# Patient Record
Sex: Male | Born: 1940
Health system: Southern US, Community
[De-identification: ages and names within clinical notes are randomized; demographics above are authoritative.]

## PROBLEM LIST (undated history)

## (undated) DIAGNOSIS — J45909 Unspecified asthma, uncomplicated: Secondary | ICD-10-CM

## (undated) DIAGNOSIS — I10 Essential (primary) hypertension: Secondary | ICD-10-CM

## (undated) DIAGNOSIS — Z8673 Personal history of transient ischemic attack (TIA), and cerebral infarction without residual deficits: Secondary | ICD-10-CM

## (undated) DIAGNOSIS — I219 Acute myocardial infarction, unspecified: Secondary | ICD-10-CM

## (undated) DIAGNOSIS — E785 Hyperlipidemia, unspecified: Secondary | ICD-10-CM

## (undated) DIAGNOSIS — I2510 Atherosclerotic heart disease of native coronary artery without angina pectoris: Secondary | ICD-10-CM

## (undated) DIAGNOSIS — F32A Depression, unspecified: Secondary | ICD-10-CM

## (undated) DIAGNOSIS — I739 Peripheral vascular disease, unspecified: Secondary | ICD-10-CM

## (undated) DIAGNOSIS — F329 Major depressive disorder, single episode, unspecified: Secondary | ICD-10-CM

## (undated) DIAGNOSIS — F419 Anxiety disorder, unspecified: Secondary | ICD-10-CM

## (undated) DIAGNOSIS — I779 Disorder of arteries and arterioles, unspecified: Secondary | ICD-10-CM

## (undated) DIAGNOSIS — J449 Chronic obstructive pulmonary disease, unspecified: Secondary | ICD-10-CM

## (undated) DIAGNOSIS — N529 Male erectile dysfunction, unspecified: Secondary | ICD-10-CM

## (undated) DIAGNOSIS — K922 Gastrointestinal hemorrhage, unspecified: Secondary | ICD-10-CM

## (undated) DIAGNOSIS — Z9289 Personal history of other medical treatment: Secondary | ICD-10-CM

## (undated) HISTORY — DX: Atherosclerotic heart disease of native coronary artery without angina pectoris: I25.10

## (undated) HISTORY — PX: TONSILLECTOMY: SUR1361

## (undated) HISTORY — DX: Hyperlipidemia, unspecified: E78.5

## (undated) HISTORY — PX: UMBILICAL HERNIA REPAIR: SHX196

## (undated) HISTORY — DX: Disorder of arteries and arterioles, unspecified: I77.9

## (undated) HISTORY — PX: FRACTURE SURGERY: SHX138

## (undated) HISTORY — PX: CATARACT EXTRACTION W/ INTRAOCULAR LENS  IMPLANT, BILATERAL: SHX1307

## (undated) HISTORY — DX: Personal history of transient ischemic attack (TIA), and cerebral infarction without residual deficits: Z86.73

## (undated) HISTORY — PX: HERNIA REPAIR: SHX51

## (undated) HISTORY — DX: Male erectile dysfunction, unspecified: N52.9

## (undated) HISTORY — DX: Essential (primary) hypertension: I10

## (undated) HISTORY — DX: Chronic obstructive pulmonary disease, unspecified: J44.9

## (undated) HISTORY — DX: Peripheral vascular disease, unspecified: I73.9

## (undated) HISTORY — PX: CORONARY ANGIOPLASTY: SHX604

## (undated) HISTORY — PX: CARDIAC CATHETERIZATION: SHX172

---

## 1993-03-02 HISTORY — PX: TIBIA FRACTURE SURGERY: SHX806

## 1997-03-02 HISTORY — PX: CORONARY ARTERY BYPASS GRAFT: SHX141

## 1997-08-08 ENCOUNTER — Other Ambulatory Visit: Admission: RE | Admit: 1997-08-08 | Discharge: 1997-08-08 | Payer: Self-pay | Admitting: Cardiology

## 1997-08-31 ENCOUNTER — Inpatient Hospital Stay (HOSPITAL_COMMUNITY): Admission: RE | Admit: 1997-08-31 | Discharge: 1997-09-10 | Payer: Self-pay | Admitting: Cardiovascular Disease

## 2006-03-02 DIAGNOSIS — Z9289 Personal history of other medical treatment: Secondary | ICD-10-CM

## 2006-03-02 DIAGNOSIS — K922 Gastrointestinal hemorrhage, unspecified: Secondary | ICD-10-CM

## 2006-03-02 HISTORY — DX: Gastrointestinal hemorrhage, unspecified: K92.2

## 2006-03-02 HISTORY — DX: Personal history of other medical treatment: Z92.89

## 2007-04-13 ENCOUNTER — Ambulatory Visit: Payer: Self-pay | Admitting: Vascular Surgery

## 2009-11-08 ENCOUNTER — Ambulatory Visit: Payer: Self-pay | Admitting: Cardiology

## 2010-04-01 ENCOUNTER — Ambulatory Visit: Payer: Self-pay | Admitting: Cardiology

## 2010-05-26 ENCOUNTER — Other Ambulatory Visit: Payer: Self-pay | Admitting: *Deleted

## 2010-05-26 DIAGNOSIS — E7800 Pure hypercholesterolemia, unspecified: Secondary | ICD-10-CM

## 2010-05-26 MED ORDER — EZETIMIBE 10 MG PO TABS: 10.0000 mg | ORAL_TABLET | Freq: Every day | ORAL | Status: DC

## 2010-05-26 NOTE — Telephone Encounter (Signed)
Refilled meds per fax request.  

## 2010-06-17 ENCOUNTER — Other Ambulatory Visit: Payer: Self-pay | Admitting: *Deleted

## 2010-06-17 DIAGNOSIS — J449 Chronic obstructive pulmonary disease, unspecified: Secondary | ICD-10-CM

## 2010-06-17 DIAGNOSIS — I10 Essential (primary) hypertension: Secondary | ICD-10-CM

## 2010-06-17 MED ORDER — HYDROCHLOROTHIAZIDE 25 MG PO TABS: 25.0000 mg | ORAL_TABLET | Freq: Every day | ORAL | Status: DC

## 2010-06-17 MED ORDER — THEOPHYLLINE 300 MG PO TB12: 300.0000 mg | ORAL_TABLET | Freq: Two times a day (BID) | ORAL | Status: DC

## 2010-06-17 NOTE — Telephone Encounter (Signed)
Refilled meds per fax request. Faxed back to pharmacy  

## 2010-06-20 ENCOUNTER — Encounter: Payer: Self-pay | Admitting: Cardiology

## 2010-06-23 ENCOUNTER — Other Ambulatory Visit: Payer: Self-pay | Admitting: *Deleted

## 2010-06-23 DIAGNOSIS — Z79899 Other long term (current) drug therapy: Secondary | ICD-10-CM

## 2010-06-23 DIAGNOSIS — E7800 Pure hypercholesterolemia, unspecified: Secondary | ICD-10-CM

## 2010-06-27 ENCOUNTER — Encounter: Payer: Self-pay | Admitting: *Deleted

## 2010-06-27 DIAGNOSIS — J449 Chronic obstructive pulmonary disease, unspecified: Secondary | ICD-10-CM

## 2010-06-27 DIAGNOSIS — I2489 Other forms of acute ischemic heart disease: Secondary | ICD-10-CM | POA: Insufficient documentation

## 2010-06-27 DIAGNOSIS — R0989 Other specified symptoms and signs involving the circulatory and respiratory systems: Secondary | ICD-10-CM

## 2010-06-27 DIAGNOSIS — I25110 Atherosclerotic heart disease of native coronary artery with unstable angina pectoris: Secondary | ICD-10-CM | POA: Insufficient documentation

## 2010-06-27 DIAGNOSIS — Z9289 Personal history of other medical treatment: Secondary | ICD-10-CM

## 2010-06-27 DIAGNOSIS — E785 Hyperlipidemia, unspecified: Secondary | ICD-10-CM

## 2010-06-27 DIAGNOSIS — I259 Chronic ischemic heart disease, unspecified: Secondary | ICD-10-CM

## 2010-06-30 ENCOUNTER — Other Ambulatory Visit: Payer: Self-pay | Admitting: *Deleted

## 2010-06-30 MED ORDER — IPRATROPIUM-ALBUTEROL 18-103 MCG/ACT IN AERO: 2.0000 | INHALATION_SPRAY | Freq: Four times a day (QID) | RESPIRATORY_TRACT | Status: DC | PRN

## 2010-06-30 NOTE — Telephone Encounter (Signed)
Faxed refill

## 2010-07-03 ENCOUNTER — Ambulatory Visit (INDEPENDENT_AMBULATORY_CARE_PROVIDER_SITE_OTHER): Payer: Medicare Other | Admitting: Cardiology

## 2010-07-03 ENCOUNTER — Other Ambulatory Visit (INDEPENDENT_AMBULATORY_CARE_PROVIDER_SITE_OTHER): Payer: Medicare Other | Admitting: *Deleted

## 2010-07-03 ENCOUNTER — Encounter: Payer: Self-pay | Admitting: Cardiology

## 2010-07-03 DIAGNOSIS — E7800 Pure hypercholesterolemia, unspecified: Secondary | ICD-10-CM

## 2010-07-03 DIAGNOSIS — Z79899 Other long term (current) drug therapy: Secondary | ICD-10-CM

## 2010-07-03 DIAGNOSIS — R0989 Other specified symptoms and signs involving the circulatory and respiratory systems: Secondary | ICD-10-CM

## 2010-07-03 DIAGNOSIS — E785 Hyperlipidemia, unspecified: Secondary | ICD-10-CM

## 2010-07-03 DIAGNOSIS — I2510 Atherosclerotic heart disease of native coronary artery without angina pectoris: Secondary | ICD-10-CM

## 2010-07-03 LAB — CBC WITH DIFFERENTIAL/PLATELET
Basophils Absolute: 0 10*3/uL (ref 0.0–0.1)
Basophils Relative: 0.4 % (ref 0.0–3.0)
Eosinophils Absolute: 0.1 10*3/uL (ref 0.0–0.7)
Eosinophils Relative: 1.7 % (ref 0.0–5.0)
HCT: 41.4 % (ref 39.0–52.0)
Hemoglobin: 14.2 g/dL (ref 13.0–17.0)
Lymphocytes Relative: 26.8 % (ref 12.0–46.0)
Lymphs Abs: 2 10*3/uL (ref 0.7–4.0)
MCHC: 34.2 g/dL (ref 30.0–36.0)
MCV: 94.9 fl (ref 78.0–100.0)
Monocytes Absolute: 0.6 10*3/uL (ref 0.1–1.0)
Monocytes Relative: 8.6 % (ref 3.0–12.0)
Neutro Abs: 4.6 10*3/uL (ref 1.4–7.7)
Neutrophils Relative %: 62.5 % (ref 43.0–77.0)
Platelets: 253 10*3/uL (ref 150.0–400.0)
RBC: 4.36 Mil/uL (ref 4.22–5.81)
RDW: 13.2 % (ref 11.5–14.6)
WBC: 7.3 10*3/uL (ref 4.5–10.5)

## 2010-07-03 LAB — LIPID PANEL
Cholesterol: 194 mg/dL (ref 0–200)
HDL: 31.2 mg/dL — ABNORMAL LOW (ref >39.00)
LDL Cholesterol: 132 mg/dL — ABNORMAL HIGH (ref 0–99)
Total CHOL/HDL Ratio: 6
Triglycerides: 152 mg/dL — ABNORMAL HIGH (ref 0.0–149.0)
VLDL: 30.4 mg/dL (ref 0.0–40.0)

## 2010-07-03 LAB — BASIC METABOLIC PANEL
BUN: 14 mg/dL (ref 6–23)
CO2: 29 mEq/L (ref 19–32)
Calcium: 8.9 mg/dL (ref 8.4–10.5)
Chloride: 99 mEq/L (ref 96–112)
Creatinine, Ser: 1.1 mg/dL (ref 0.4–1.5)
GFR: 73.76 mL/min (ref >60.00)
Glucose, Bld: 89 mg/dL (ref 70–99)
Potassium: 3.4 mEq/L — ABNORMAL LOW (ref 3.5–5.1)
Sodium: 137 mEq/L (ref 135–145)

## 2010-07-03 LAB — HEPATIC FUNCTION PANEL
ALT: 36 U/L (ref 0–53)
AST: 32 U/L (ref 0–37)
Albumin: 3.7 g/dL (ref 3.5–5.2)
Alkaline Phosphatase: 75 U/L (ref 39–117)
Bilirubin, Direct: 0.1 mg/dL (ref 0.0–0.3)
Total Bilirubin: 0.7 mg/dL (ref 0.3–1.2)
Total Protein: 7 g/dL (ref 6.0–8.3)

## 2010-07-03 NOTE — Assessment & Plan Note (Signed)
The patient has a known left carotid bruit.  He has had carotid Doppler in 2009 which suggested less than a 50% stenosis.  He has not been expressing any TIA symptoms.
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
## 2010-07-03 NOTE — Assessment & Plan Note (Signed)
The patient has not been experiencing any recurrent chest pain or angina. 

## 2010-07-03 NOTE — Assessment & Plan Note (Signed)
The patient has not been getting any regular exercise.  His weight remains too high.  He is not having any side effects from his cholesterol medication.  He is intolerant of statins because of myalgias.

## 2010-07-04 ENCOUNTER — Other Ambulatory Visit: Payer: Self-pay | Admitting: *Deleted

## 2010-07-04 ENCOUNTER — Telehealth: Payer: Self-pay | Admitting: *Deleted

## 2010-07-04 ENCOUNTER — Ambulatory Visit: Payer: Self-pay | Admitting: Cardiology

## 2010-07-04 NOTE — Telephone Encounter (Signed)
Adv pt of lab

## 2010-07-15 NOTE — Procedures (Signed)
CAROTID DUPLEX EXAM   INDICATION:  Left carotid bruit.   HISTORY:  Diabetes:  No.  Cardiac:  CABG in 1999.  Hypertension:  Yes.  Smoking:  Cigar.  Previous Surgery:  CV History:  Amaurosis Fugax No, Paresthesias No, Hemiparesis No                                       RIGHT             LEFT  Brachial systolic pressure:         110               100  Brachial Doppler waveforms:         Biphasic          Biphasic  Vertebral direction of flow:        Antegrade         Antegrade  DUPLEX VELOCITIES (cm/sec)  CCA peak systolic                   88                235 (distal)  ECA peak systolic                   118               178  ICA peak systolic                   64                61  ICA end diastolic                   18                18  PLAQUE MORPHOLOGY:                  Calcified         Calcified  PLAQUE AMOUNT:                      Mild              Moderate  PLAQUE LOCATION:                    ICA, ECA          CCA, ECA, ICA   IMPRESSION:  1. 20-39% stenosis noted in bilateral internal carotid arteries.  2. Approximately 60-79% stenosis noted in the left bifurcation and      distal common carotid artery.  3. Antegrade bilateral vertebral arteries.   ___________________________________________  Larina Earthly, M.D.   MG/MEDQ  D:  04/13/2007  T:  04/14/2007  Job:  [id number]

## 2010-09-08 ENCOUNTER — Other Ambulatory Visit: Payer: Self-pay | Admitting: Cardiology

## 2010-09-09 ENCOUNTER — Other Ambulatory Visit: Payer: Self-pay | Admitting: *Deleted

## 2010-09-09 MED ORDER — OMEGA-3-ACID ETHYL ESTERS 1 G PO CAPS: 2.0000 g | ORAL_CAPSULE | Freq: Two times a day (BID) | ORAL | Status: DC

## 2010-09-09 NOTE — Telephone Encounter (Signed)
Fax received from pharmacy. Refill completed. Jodette Jarod Bozzo RN  

## 2010-09-09 NOTE — Telephone Encounter (Signed)
Fax received from pharmacy. Refill completed. Jodette Tasman Zapata RN  

## 2010-09-10 ENCOUNTER — Other Ambulatory Visit: Payer: Self-pay | Admitting: Cardiology

## 2010-09-10 DIAGNOSIS — J302 Other seasonal allergic rhinitis: Secondary | ICD-10-CM

## 2010-10-15 ENCOUNTER — Other Ambulatory Visit: Payer: Self-pay | Admitting: *Deleted

## 2010-10-15 DIAGNOSIS — E876 Hypokalemia: Secondary | ICD-10-CM

## 2010-10-15 MED ORDER — POTASSIUM CHLORIDE CRYS ER 20 MEQ PO TBCR: 20.0000 meq | EXTENDED_RELEASE_TABLET | Freq: Every day | ORAL | Status: DC | PRN

## 2010-10-15 NOTE — Telephone Encounter (Signed)
Refilled meds per fax request. Faxed signed Rx back 

## 2010-11-06 ENCOUNTER — Other Ambulatory Visit: Payer: Self-pay | Admitting: *Deleted

## 2010-11-06 DIAGNOSIS — F419 Anxiety disorder, unspecified: Secondary | ICD-10-CM

## 2010-11-06 NOTE — Telephone Encounter (Signed)
Refilled meds per fax request.  

## 2010-11-08 MED ORDER — ALPRAZOLAM 1 MG PO TABS: 1.0000 mg | ORAL_TABLET | Freq: Four times a day (QID) | ORAL | Status: DC

## 2010-11-11 ENCOUNTER — Other Ambulatory Visit: Payer: Self-pay | Admitting: Cardiology

## 2010-11-11 DIAGNOSIS — I2510 Atherosclerotic heart disease of native coronary artery without angina pectoris: Secondary | ICD-10-CM

## 2010-11-11 DIAGNOSIS — E785 Hyperlipidemia, unspecified: Secondary | ICD-10-CM

## 2010-11-11 DIAGNOSIS — I259 Chronic ischemic heart disease, unspecified: Secondary | ICD-10-CM

## 2010-11-14 ENCOUNTER — Ambulatory Visit (INDEPENDENT_AMBULATORY_CARE_PROVIDER_SITE_OTHER): Payer: Medicare Other | Admitting: Cardiology

## 2010-11-14 ENCOUNTER — Other Ambulatory Visit: Payer: Self-pay | Admitting: Cardiology

## 2010-11-14 ENCOUNTER — Other Ambulatory Visit (INDEPENDENT_AMBULATORY_CARE_PROVIDER_SITE_OTHER): Payer: Medicare Other | Admitting: *Deleted

## 2010-11-14 ENCOUNTER — Encounter: Payer: Self-pay | Admitting: Cardiology

## 2010-11-14 VITALS — BP 120/80 | HR 66 | Ht 70.0 in | Wt 203.4 lb

## 2010-11-14 DIAGNOSIS — J309 Allergic rhinitis, unspecified: Secondary | ICD-10-CM

## 2010-11-14 DIAGNOSIS — I2510 Atherosclerotic heart disease of native coronary artery without angina pectoris: Secondary | ICD-10-CM

## 2010-11-14 DIAGNOSIS — I119 Hypertensive heart disease without heart failure: Secondary | ICD-10-CM

## 2010-11-14 DIAGNOSIS — I259 Chronic ischemic heart disease, unspecified: Secondary | ICD-10-CM

## 2010-11-14 DIAGNOSIS — E785 Hyperlipidemia, unspecified: Secondary | ICD-10-CM

## 2010-11-14 DIAGNOSIS — E7800 Pure hypercholesterolemia, unspecified: Secondary | ICD-10-CM

## 2010-11-14 DIAGNOSIS — J302 Other seasonal allergic rhinitis: Secondary | ICD-10-CM

## 2010-11-14 DIAGNOSIS — R0989 Other specified symptoms and signs involving the circulatory and respiratory systems: Secondary | ICD-10-CM

## 2010-11-14 LAB — BASIC METABOLIC PANEL
BUN: 13 mg/dL (ref 6–23)
CO2: 29 mEq/L (ref 19–32)
Calcium: 9.6 mg/dL (ref 8.4–10.5)
Chloride: 97 mEq/L (ref 96–112)
Creatinine, Ser: 1.2 mg/dL (ref 0.4–1.5)
GFR: 62.65 mL/min (ref >60.00)
Glucose, Bld: 83 mg/dL (ref 70–99)
Potassium: 3.7 mEq/L (ref 3.5–5.1)
Sodium: 137 mEq/L (ref 135–145)

## 2010-11-14 LAB — LIPID PANEL
Cholesterol: 208 mg/dL — ABNORMAL HIGH (ref 0–200)
HDL: 34.3 mg/dL — ABNORMAL LOW (ref >39.00)
Total CHOL/HDL Ratio: 6
Triglycerides: 130 mg/dL (ref 0.0–149.0)
VLDL: 26 mg/dL (ref 0.0–40.0)

## 2010-11-14 LAB — HEPATIC FUNCTION PANEL
ALT: 37 U/L (ref 0–53)
AST: 32 U/L (ref 0–37)
Albumin: 4 g/dL (ref 3.5–5.2)
Alkaline Phosphatase: 82 U/L (ref 39–117)
Bilirubin, Direct: 0.2 mg/dL (ref 0.0–0.3)
Total Bilirubin: 1.2 mg/dL (ref 0.3–1.2)
Total Protein: 7.6 g/dL (ref 6.0–8.3)

## 2010-11-14 LAB — LDL CHOLESTEROL, DIRECT: Direct LDL: 151.4 mg/dL

## 2010-11-14 MED ORDER — FLUTICASONE PROPIONATE 50 MCG/ACT NA SUSP: 2.0000 | Freq: Every day | NASAL | Status: DC

## 2010-11-14 NOTE — Progress Notes (Signed)
William Elliott Date of Birth:  December 04, 1940 [facility] / [facility] [address]   [address] [place], [place]  [address] [phone]           Fax   [phone]  History of Present Illness: This 70 year old gentleman is seen for a scheduled 4 month followup office visit.  He has a past history of ischemic heart disease and is status post CABG in 1999.  He has a history of an asymptomatic left carotid bruit.  He has a history of erectile dysfunction responsive to Viagra.  His had a history of dyslipidemia and a history of essential hypertension.  He also has a history of seasonal allergies.  He had a carotid Doppler done in [place] on 10/27/07 which showed prominent atherosclerotic pastor disease but no findings of hemodynamically significant stenosis.  He has a history of diastolic dysfunction with normal systolic function and moderate LVH by echocardiogram 08/12/06.  He also has mild aortic sclerosis and mild mitral regurgitation  Current Outpatient Prescriptions  Medication Sig Dispense Refill  . albuterol-ipratropium (COMBIVENT) 18-103 MCG/ACT inhaler Inhale 2 puffs into the lungs every 6 (six) hours as needed for wheezing.  15 Inhaler  11  . ALPRAZolam (XANAX) 1 MG tablet Take 1 tablet (1 mg total) by mouth 4 (four) times daily.  120 tablet  3  . amLODipine (NORVASC) 5 MG tablet Take 5 mg by mouth daily.        Marland Kitchen ezetimibe (ZETIA) 10 MG tablet Take 1 tablet (10 mg total) by mouth daily.  30 tablet  11  . fluticasone (FLONASE) 50 MCG/ACT nasal spray Place 2 sprays into the nose daily.  16 g  2  . hydrochlorothiazide 25 MG tablet Take 1 tablet (25 mg total) by mouth daily.  30 tablet  11  . Ipratropium-Albuterol (COMBIVENT IN) Inhale 2 puffs into the lungs 2 (two) times daily.        Marland Kitchen losartan (COZAAR) 100 MG tablet Take 100 mg by mouth daily.        . metoprolol (TOPROL-XL) 100 MG 24 hr tablet Take 100 mg by mouth daily.        . multivitamin (THERAGRAN) per tablet Take 1  tablet by mouth daily.        Marland Kitchen omega-3 acid ethyl esters (LOVAZA) 1 G capsule Take 2 capsules (2 g total) by mouth 2 (two) times daily. Take one 2 gram cap in morning and one 2 gram cap in evening  120 capsule  5  . potassium chloride SA (K-DUR,KLOR-CON) 20 MEQ tablet Take 20 mEq by mouth daily.        . sildenafil (VIAGRA) 100 MG tablet Take 100 mg by mouth daily as needed.        . theophylline (THEOPHYLLINE) 300 MG 12 hr tablet Take 1 tablet (300 mg total) by mouth 2 (two) times daily.  60 tablet  11    Allergies  Allergen Reactions  . Lipitor (Atorvastatin Calcium)   . Niaspan (Niacin)   . Omacor (Omega-3-Acid Ethyl Esters)     rash  . Tricor     No energy  . Zocor (Simvastatin)     Patient Active Problem List  Diagnoses  . Coronary artery disease  . Hyperlipidemia  . Carotid bruit  . Ischemic heart disease  . Dyslipidemia  . COPD (chronic obstructive pulmonary disease)    History  Smoking status  . Former Smoker  . Types: Cigars  . Quit date: 03/02/1997  Smokeless tobacco  . Not on file    History  Alcohol Use No    Family History  Problem Relation Age of Onset  . Heart disease Father   . Heart attack Father   . Hypertension Mother   . Stroke Mother     Review of Systems: Constitutional: no fever chills diaphoresis or fatigue or change in weight.  Head and neck: no hearing loss, no epistaxis, no photophobia or visual disturbance. Respiratory: No cough, shortness of breath or wheezing. Cardiovascular: No chest pain peripheral edema, palpitations. Gastrointestinal: No abdominal distention, no abdominal pain, no change in bowel habits hematochezia or melena. Genitourinary: No dysuria, no frequency, no urgency, no nocturia. Musculoskeletal:No arthralgias, no back pain, no gait disturbance or myalgias. Neurological: No dizziness, no headaches, no numbness, no seizures, no syncope, no weakness, no tremors. Hematologic: No lymphadenopathy, no easy  bruising. Psychiatric: No confusion, no hallucinations, no sleep disturbance.    Physical Exam: Filed Vitals:   11/14/10 1113  BP: 120/80  Pulse: 66  The general appearance reveals a well-developed well-nourished gentleman in no distress.Pupils equal and reactive.   Extraocular Movements are full.  There is no scleral icterus.  The mouth and pharynx are normal.  The neck is supple.  The carotids reveal Soft left-sided bruits.  The jugular venous pressure is normal.  The thyroid is not enlarged.  There is no lymphadenopathy.The chest is clear to percussion and auscultation. There are no rales or rhonchi. Expansion of the chest is symmetrical.  The precordium is quiet.  The first heart sound is normal.  The second heart sound is physiologically split.  There is no murmur gallop rub or click.  There is no abnormal lift or heave.  The abdomen is soft and nontender. Bowel sounds are normal. The liver and spleen are not enlarged. There Are no abdominal masses. There are no bruits.  Normal extremity without phlebitis or edema.  Pedal pulses are present.Strength is normal and symmetrical in all extremities.  There is no lateralizing weakness.  There are no sensory deficits.       Assessment / Plan: Continue same medications.  He will be moving back to [facility] soon.I gave him a written note allowing him to have his door in his to cockatiels as pets for needed companionship.  Recheck in 4 months for office visit and lab

## 2010-11-14 NOTE — Assessment & Plan Note (Signed)
The patient has not had any recurrent angina pectoris.  He has not had to take any nitroglycerin

## 2010-11-14 NOTE — Assessment & Plan Note (Signed)
Patient has had no TIA symptoms or other symptoms from his carotid artery bruit.  His last Doppler was in 2009.

## 2010-11-14 NOTE — Assessment & Plan Note (Signed)
The patient has a past history of dyslipidemia and is on Zetia and Lovaza.  He is intolerant to statins.  Blood work pending.

## 2010-11-20 ENCOUNTER — Telehealth: Payer: Self-pay | Admitting: *Deleted

## 2010-11-20 NOTE — Progress Notes (Signed)
Advised patient and wife of his LDL being too high. And he should continue his Lovaza and Zetia. Also try to lose weight

## 2010-11-20 NOTE — Telephone Encounter (Signed)
Message copied by Royanne Foots on Thu Nov 20, 2010 11:19 AM ------      Message from: Cassell Clement      Created: Sat Nov 15, 2010  3:56 PM       The LDL is 151 which is still too high so work harder on careful diet.Continue Zetia and Lovaza.  Try to lose weight.

## 2010-11-20 NOTE — Progress Notes (Signed)
Advised patient and wife of lab results  

## 2010-11-20 NOTE — Telephone Encounter (Signed)
Message copied by Royanne Foots on Thu Nov 20, 2010 11:04 AM ------      Message from: Cassell Clement      Created: Sat Nov 15, 2010  3:54 PM       Please report.The electrolytes are normal.The liver tests are normal.The cholesterol was borderline high at 208 and the triglycerides are down to normal.  Continue same medicine and continue careful diet.

## 2010-11-20 NOTE — Telephone Encounter (Signed)
Advised patient and wife about his LDL being too high. He should continue his Zetia and Lovaza, and try to lose weight

## 2010-11-20 NOTE — Telephone Encounter (Signed)
Advised patient and wife of lab results  

## 2010-12-17 ENCOUNTER — Telehealth: Payer: Self-pay | Admitting: Cardiology

## 2010-12-17 NOTE — Telephone Encounter (Signed)
Advised patient we didn't have that information.  Has had transfusions before, but at hospital out of town.

## 2010-12-17 NOTE — Telephone Encounter (Signed)
Son needs to speak to you regarding fathers health.  He will tell you more when you call.  Can you please call him between 8-9 am tomorrow (Thurs).  He will not be available until then.

## 2010-12-17 NOTE — Telephone Encounter (Signed)
Pt wants you to call about blood type please call

## 2010-12-18 NOTE — Telephone Encounter (Signed)
Spoke with son and he is requesting a letter stating patient needed to move secondary to health.  Will get together for  Dr. Patty Sermons to sign when he is back

## 2010-12-18 NOTE — Telephone Encounter (Signed)
Left message

## 2010-12-25 ENCOUNTER — Other Ambulatory Visit: Payer: Self-pay | Admitting: *Deleted

## 2010-12-25 DIAGNOSIS — I10 Essential (primary) hypertension: Secondary | ICD-10-CM

## 2010-12-25 DIAGNOSIS — F419 Anxiety disorder, unspecified: Secondary | ICD-10-CM

## 2010-12-25 MED ORDER — HYDROCHLOROTHIAZIDE 25 MG PO TABS: 25.0000 mg | ORAL_TABLET | Freq: Every day | ORAL | Status: DC

## 2010-12-25 NOTE — Telephone Encounter (Signed)
Xanax refill 

## 2010-12-25 NOTE — Telephone Encounter (Signed)
Refilled hctz 

## 2010-12-28 MED ORDER — ALPRAZOLAM 1 MG PO TABS: 1.0000 mg | ORAL_TABLET | Freq: Four times a day (QID) | ORAL | Status: DC

## 2010-12-30 ENCOUNTER — Encounter: Payer: Self-pay | Admitting: *Deleted

## 2010-12-30 NOTE — Telephone Encounter (Signed)
Will have  Dr. Patty Sermons do letter

## 2011-01-07 ENCOUNTER — Other Ambulatory Visit: Payer: Self-pay | Admitting: *Deleted

## 2011-01-07 DIAGNOSIS — J302 Other seasonal allergic rhinitis: Secondary | ICD-10-CM

## 2011-01-07 MED ORDER — FLUTICASONE PROPIONATE 50 MCG/ACT NA SUSP: 2.0000 | Freq: Every day | NASAL | Status: DC

## 2011-01-07 NOTE — Telephone Encounter (Signed)
Refilled meds per fax request.  

## 2011-01-28 ENCOUNTER — Telehealth: Payer: Self-pay | Admitting: Cardiology

## 2011-01-28 NOTE — Telephone Encounter (Signed)
Pt wants to talk to you about his medication from [facility] it has not come in yet

## 2011-01-28 NOTE — Telephone Encounter (Signed)
Let message, ? What medication

## 2011-01-29 NOTE — Telephone Encounter (Signed)
Wants Rx for Viagra

## 2011-01-29 NOTE — Telephone Encounter (Addendum)
F/u pt called and he need Viagra 100 mg. Order number is [id number].  Please call patient when ready for pick up at office. Pt is aware that Juliette Alcide is off today

## 2011-01-30 NOTE — Telephone Encounter (Signed)
Left message

## 2011-02-02 NOTE — Telephone Encounter (Signed)
FU Call: Pt returning call to [place]. Please return pt call to discuss further.  

## 2011-02-04 NOTE — Telephone Encounter (Signed)
[phone] will be eligible for reorder on Dec 10, will call and reorder then.  Guidelines for the Viagra is 30 tablets is a 90 day supply.

## 2011-02-05 NOTE — Telephone Encounter (Signed)
FU Call: Pt returning call from [place]. Please call pt back.

## 2011-02-05 NOTE — Telephone Encounter (Signed)
Follow up on previous call:  Returning call back to nurse.   

## 2011-02-05 NOTE — Telephone Encounter (Signed)
Advised patient #30 Viagra is considered a 90 day supple with patient assistance program.  Will reorder for him on the 10th when eligible for reorder.

## 2011-02-09 NOTE — Telephone Encounter (Signed)
[id number] order # - reordered for patient

## 2011-03-17 ENCOUNTER — Encounter: Payer: Self-pay | Admitting: Cardiology

## 2011-03-17 ENCOUNTER — Ambulatory Visit (INDEPENDENT_AMBULATORY_CARE_PROVIDER_SITE_OTHER): Payer: Medicare Other | Admitting: Cardiology

## 2011-03-17 VITALS — BP 130/76 | HR 67 | Resp 16 | Ht 70.0 in | Wt 206.0 lb

## 2011-03-17 DIAGNOSIS — E785 Hyperlipidemia, unspecified: Secondary | ICD-10-CM

## 2011-03-17 DIAGNOSIS — N138 Other obstructive and reflux uropathy: Secondary | ICD-10-CM | POA: Diagnosis not present

## 2011-03-17 DIAGNOSIS — N401 Enlarged prostate with lower urinary tract symptoms: Secondary | ICD-10-CM

## 2011-03-17 DIAGNOSIS — I119 Hypertensive heart disease without heart failure: Secondary | ICD-10-CM

## 2011-03-17 DIAGNOSIS — J449 Chronic obstructive pulmonary disease, unspecified: Secondary | ICD-10-CM

## 2011-03-17 DIAGNOSIS — I2510 Atherosclerotic heart disease of native coronary artery without angina pectoris: Secondary | ICD-10-CM

## 2011-03-17 DIAGNOSIS — J45909 Unspecified asthma, uncomplicated: Secondary | ICD-10-CM

## 2011-03-17 DIAGNOSIS — R351 Nocturia: Secondary | ICD-10-CM

## 2011-03-17 LAB — LIPID PANEL
Cholesterol: 200 mg/dL (ref 0–200)
HDL: 33.6 mg/dL — ABNORMAL LOW (ref >39.00)
LDL Cholesterol: 138 mg/dL — ABNORMAL HIGH (ref 0–99)
Total CHOL/HDL Ratio: 6
Triglycerides: 140 mg/dL (ref 0.0–149.0)
VLDL: 28 mg/dL (ref 0.0–40.0)

## 2011-03-17 LAB — HEPATIC FUNCTION PANEL
ALT: 38 U/L (ref 0–53)
AST: 36 U/L (ref 0–37)
Albumin: 3.8 g/dL (ref 3.5–5.2)
Alkaline Phosphatase: 82 U/L (ref 39–117)
Bilirubin, Direct: 0.1 mg/dL (ref 0.0–0.3)
Total Bilirubin: 0.9 mg/dL (ref 0.3–1.2)
Total Protein: 7.1 g/dL (ref 6.0–8.3)

## 2011-03-17 LAB — BASIC METABOLIC PANEL
BUN: 12 mg/dL (ref 6–23)
CO2: 27 mEq/L (ref 19–32)
Calcium: 9.7 mg/dL (ref 8.4–10.5)
Chloride: 104 mEq/L (ref 96–112)
Creatinine, Ser: 1.1 mg/dL (ref 0.4–1.5)
GFR: 72.04 mL/min (ref >60.00)
Glucose, Bld: 118 mg/dL — ABNORMAL HIGH (ref 70–99)
Potassium: 3.6 mEq/L (ref 3.5–5.1)
Sodium: 142 mEq/L (ref 135–145)

## 2011-03-17 LAB — PSA: PSA: 0.53 ng/mL (ref 0.10–4.00)

## 2011-03-17 NOTE — Patient Instructions (Signed)
Will obtain labs today and call you with the results  Your physician recommends that you continue on your current medications as directed. Please refer to the Current Medication list given to you today. Your physician wants you to follow-up in: 4 months  You will receive a reminder letter in the mail two months in advance. If you don't receive a letter, please call our office to schedule the follow-up appointment.  

## 2011-03-17 NOTE — Assessment & Plan Note (Signed)
The patient has a history of erectile dysfunction and is on Viagra.  He also has a history of BPH with nocturia.  We're checking a PSA level today.

## 2011-03-17 NOTE — Assessment & Plan Note (Signed)
The patient has a history of COPD and a history of asthma.  He does have Combivent inhaler on hand for when necessary use.  He has not had any purulent sputum.  He is not having any hemoptysis or significant cough at the present time

## 2011-03-17 NOTE — Progress Notes (Signed)
Ruben Reason Date of Birth:  08/19/1940 [facility] [address] [place], [place]  [address] [phone]         Fax   [phone]  History of Present Illness: This pleasant 71 year old gentleman is seen for a scheduled followup office visit.  He has a past history of ischemic heart disease.  He had CABG in 1999.  He has a history of an asymptomatic left carotid bruit.  He has a history of erectile dysfunction responsive to Viagra.  He's also had essential hypertension and dyslipidemia.  His last carotid Doppler in 2009 showed no hemodynamically significant stenosis.  His last echocardiogram in 2008 showed normal systolic function with diastolic dysfunction and moderate LVH as well as mild aortic sclerosis and mild mitral regurgitation.  Current Outpatient Prescriptions  Medication Sig Dispense Refill  . albuterol-ipratropium (COMBIVENT) 18-103 MCG/ACT inhaler Inhale 2 puffs into the lungs every 6 (six) hours as needed for wheezing.  15 Inhaler  11  . ALPRAZolam (XANAX) 1 MG tablet Take 1 tablet (1 mg total) by mouth 4 (four) times daily.  120 tablet  3  . amLODipine (NORVASC) 5 MG tablet Take 5 mg by mouth daily.        Marland Kitchen ezetimibe (ZETIA) 10 MG tablet Take 1 tablet (10 mg total) by mouth daily.  30 tablet  11  . fluticasone (FLONASE) 50 MCG/ACT nasal spray Place 2 sprays into the nose daily.  16 g  2  . hydrochlorothiazide (HYDRODIURIL) 25 MG tablet Take 1 tablet (25 mg total) by mouth daily.  30 tablet  11  . Ipratropium-Albuterol (COMBIVENT IN) Inhale 2 puffs into the lungs 2 (two) times daily.        Marland Kitchen losartan (COZAAR) 100 MG tablet Take 100 mg by mouth daily.        . metoprolol (TOPROL-XL) 100 MG 24 hr tablet Take 100 mg by mouth daily.        . multivitamin (THERAGRAN) per tablet Take 1 tablet by mouth daily.        Marland Kitchen omega-3 acid ethyl esters (LOVAZA) 1 G capsule Take 2 capsules (2 g total) by mouth 2 (two) times daily. Take one 2 gram cap in morning and  one 2 gram cap in evening  120 capsule  5  . potassium chloride SA (K-DUR,KLOR-CON) 20 MEQ tablet Take 20 mEq by mouth daily.        . sildenafil (VIAGRA) 100 MG tablet Take 100 mg by mouth daily as needed.        . theophylline (THEOPHYLLINE) 300 MG 12 hr tablet Take 1 tablet (300 mg total) by mouth 2 (two) times daily.  60 tablet  11    Allergies  Allergen Reactions  . Lipitor (Atorvastatin Calcium)   . Niaspan (Niacin)   . Omacor (Omega-3-Acid Ethyl Esters)     rash  . Tricor     No energy  . Zocor (Simvastatin)     Patient Active Problem List  Diagnoses  . Coronary artery disease  . Hyperlipidemia  . Carotid bruit  . Ischemic heart disease  . Dyslipidemia  . COPD (chronic obstructive pulmonary disease)    History  Smoking status  . Former Smoker  . Types: Cigars  . Quit date: 03/02/1997  Smokeless tobacco  . Not on file    History  Alcohol Use No    Family History  Problem Relation Age of Onset  . Heart disease Father   . Heart attack Father   .  Hypertension Mother   . Stroke Mother     Review of Systems: Constitutional: no fever chills diaphoresis or fatigue or change in weight.  Head and neck: no hearing loss, no epistaxis, no photophobia or visual disturbance. Respiratory: No cough, shortness of breath or wheezing. Cardiovascular: No chest pain peripheral edema, palpitations. Gastrointestinal: No abdominal distention, no abdominal pain, no change in bowel habits hematochezia or melena. Genitourinary: No dysuria, no frequency, no urgency, no nocturia. Musculoskeletal:No arthralgias, no back pain, no gait disturbance or myalgias. Neurological: No dizziness, no headaches, no numbness, no seizures, no syncope, no weakness, no tremors. Hematologic: No lymphadenopathy, no easy bruising. Psychiatric: No confusion, no hallucinations, no sleep disturbance.    Physical Exam: Filed Vitals:   03/17/11 1046  BP: 130/76  Pulse:  67   Resp:   The general  appearance reveals a well-developed well-nourished gentleman in no distress.Pupils equal and reactive.   Extraocular Movements are full.  There is no scleral icterus.  The mouth and pharynx are normal.  The neck is supple.  The carotids reveal left carotid bruit.  The jugular venous pressure is normal.  The thyroid is not enlarged.  There is no lymphadenopathy. The chest is clear to percussion and auscultation. There are no rales or rhonchi. Expansion of the chest is symmetrical.  The precordium is quiet.  The first heart sound is normal.  The second heart sound is physiologically split.  There is no murmur gallop rub or click.  There is no abnormal lift or heave.  The abdomen is soft and nontender. Bowel sounds are normal. The liver and spleen are not enlarged. There Are no abdominal masses. There are no bruits.  The pedal pulses are good.  There is no phlebitis or edema.  There is no cyanosis or clubbing. Strength is normal and symmetrical in all extremities.  There is no lateralizing weakness.  There are no sensory deficits.  The skin is warm and dry.  There is no rash.  EKG today shows normal sinus rhythm with first-degree AV block and occasional PVCs.  No ischemic changes.    Assessment / Plan:  Continue on same medication.  Recheck in 4 months for office visit and fasting lab work.  He needs to lose weight.

## 2011-03-17 NOTE — Assessment & Plan Note (Signed)
The patient has not been having any recurrent chest pain or angina.  He is under less stress now.  He has moved back to [facility] and has a nice apartment in [place].

## 2011-03-17 NOTE — Assessment & Plan Note (Signed)
Patient is intolerant of statins.  He is on Zetia.  We are checking lab work today.  He is not having any myalgias from the cholesterol-lowering medication

## 2011-03-20 ENCOUNTER — Other Ambulatory Visit: Payer: Self-pay | Admitting: *Deleted

## 2011-03-20 DIAGNOSIS — E7800 Pure hypercholesterolemia, unspecified: Secondary | ICD-10-CM

## 2011-03-20 DIAGNOSIS — I119 Hypertensive heart disease without heart failure: Secondary | ICD-10-CM

## 2011-03-20 DIAGNOSIS — J449 Chronic obstructive pulmonary disease, unspecified: Secondary | ICD-10-CM

## 2011-03-20 DIAGNOSIS — J302 Other seasonal allergic rhinitis: Secondary | ICD-10-CM

## 2011-03-20 DIAGNOSIS — F419 Anxiety disorder, unspecified: Secondary | ICD-10-CM

## 2011-03-20 MED ORDER — METOPROLOL SUCCINATE ER 100 MG PO TB24: 100.0000 mg | ORAL_TABLET | Freq: Every day | ORAL | Status: DC

## 2011-03-20 MED ORDER — THEOPHYLLINE 300 MG PO TB12: 300.0000 mg | ORAL_TABLET | Freq: Two times a day (BID) | ORAL | Status: DC

## 2011-03-20 MED ORDER — AMLODIPINE BESYLATE 5 MG PO TABS: 5.0000 mg | ORAL_TABLET | Freq: Every day | ORAL | Status: DC

## 2011-03-20 MED ORDER — OMEGA-3-ACID ETHYL ESTERS 1 G PO CAPS: ORAL_CAPSULE | ORAL | Status: DC

## 2011-03-20 MED ORDER — LOSARTAN POTASSIUM 100 MG PO TABS: 100.0000 mg | ORAL_TABLET | Freq: Every day | ORAL | Status: DC

## 2011-03-20 MED ORDER — EZETIMIBE 10 MG PO TABS: 10.0000 mg | ORAL_TABLET | Freq: Every day | ORAL | Status: DC

## 2011-03-20 MED ORDER — FLUTICASONE PROPIONATE 50 MCG/ACT NA SUSP: 2.0000 | Freq: Every day | NASAL | Status: DC

## 2011-03-20 NOTE — Telephone Encounter (Signed)
Requested at office visit 

## 2011-03-23 ENCOUNTER — Telehealth: Payer: Self-pay | Admitting: *Deleted

## 2011-03-23 MED ORDER — ALPRAZOLAM 1 MG PO TABS: 1.0000 mg | ORAL_TABLET | Freq: Four times a day (QID) | ORAL | Status: DC

## 2011-03-23 NOTE — Telephone Encounter (Signed)
Message copied by Burnell Blanks on Mon Mar 23, 2011  5:34 PM ------      Message from: Cassell Clement      Created: Tue Mar 17, 2011  4:21 PM       Please report. PSA 0.53 normal.  LDL and sugar are still high.  Needs to watch diet better and lose weight. Liver okay. CSD.

## 2011-03-23 NOTE — Telephone Encounter (Signed)
Advised of labs.  Patient states he saw COPD on his list of problems, but has never has been diagnosed.  Would like for this to be taken off list if he doesn't, has had asthma for years

## 2011-03-23 NOTE — Telephone Encounter (Signed)
His asthma is felt to be related to COPD and prior smoking.  We can discuss further at next OV and also discuss updating a chest xray after that.

## 2011-04-08 NOTE — Telephone Encounter (Signed)
Patient stated to me never was a smoker, discuss at next ov

## 2011-05-04 ENCOUNTER — Other Ambulatory Visit: Payer: Self-pay | Admitting: *Deleted

## 2011-05-04 DIAGNOSIS — F419 Anxiety disorder, unspecified: Secondary | ICD-10-CM

## 2011-05-04 MED ORDER — ALPRAZOLAM 1 MG PO TABS: 1.0000 mg | ORAL_TABLET | Freq: Four times a day (QID) | ORAL | Status: DC

## 2011-05-04 NOTE — Telephone Encounter (Signed)
Refill on alprazolam 

## 2011-05-20 ENCOUNTER — Telehealth: Payer: Self-pay | Admitting: *Deleted

## 2011-05-20 NOTE — Telephone Encounter (Signed)
Advised Viagra here from patient assistance ready for pick up

## 2011-07-08 ENCOUNTER — Other Ambulatory Visit: Payer: Self-pay | Admitting: Cardiology

## 2011-07-08 DIAGNOSIS — R21 Rash and other nonspecific skin eruption: Secondary | ICD-10-CM

## 2011-07-30 ENCOUNTER — Telehealth: Payer: Self-pay | Admitting: *Deleted

## 2011-07-30 ENCOUNTER — Ambulatory Visit (INDEPENDENT_AMBULATORY_CARE_PROVIDER_SITE_OTHER): Payer: Medicare Other | Admitting: Cardiology

## 2011-07-30 ENCOUNTER — Other Ambulatory Visit: Payer: Medicare Other

## 2011-07-30 ENCOUNTER — Encounter: Payer: Self-pay | Admitting: Cardiology

## 2011-07-30 VITALS — BP 122/72 | HR 52 | Ht 70.0 in | Wt 204.0 lb

## 2011-07-30 DIAGNOSIS — R0989 Other specified symptoms and signs involving the circulatory and respiratory systems: Secondary | ICD-10-CM

## 2011-07-30 DIAGNOSIS — I119 Hypertensive heart disease without heart failure: Secondary | ICD-10-CM

## 2011-07-30 DIAGNOSIS — F411 Generalized anxiety disorder: Secondary | ICD-10-CM

## 2011-07-30 DIAGNOSIS — F419 Anxiety disorder, unspecified: Secondary | ICD-10-CM

## 2011-07-30 DIAGNOSIS — E7800 Pure hypercholesterolemia, unspecified: Secondary | ICD-10-CM | POA: Diagnosis not present

## 2011-07-30 DIAGNOSIS — I2510 Atherosclerotic heart disease of native coronary artery without angina pectoris: Secondary | ICD-10-CM | POA: Diagnosis not present

## 2011-07-30 DIAGNOSIS — R0600 Dyspnea, unspecified: Secondary | ICD-10-CM

## 2011-07-30 DIAGNOSIS — E785 Hyperlipidemia, unspecified: Secondary | ICD-10-CM

## 2011-07-30 DIAGNOSIS — J309 Allergic rhinitis, unspecified: Secondary | ICD-10-CM

## 2011-07-30 DIAGNOSIS — R0609 Other forms of dyspnea: Secondary | ICD-10-CM

## 2011-07-30 DIAGNOSIS — J302 Other seasonal allergic rhinitis: Secondary | ICD-10-CM

## 2011-07-30 LAB — HEPATIC FUNCTION PANEL
ALT: 37 U/L (ref 0–53)
AST: 32 U/L (ref 0–37)
Albumin: 3.6 g/dL (ref 3.5–5.2)
Alkaline Phosphatase: 75 U/L (ref 39–117)
Bilirubin, Direct: 0.1 mg/dL (ref 0.0–0.3)
Total Bilirubin: 0.9 mg/dL (ref 0.3–1.2)
Total Protein: 7.2 g/dL (ref 6.0–8.3)

## 2011-07-30 LAB — BASIC METABOLIC PANEL
BUN: 14 mg/dL (ref 6–23)
CO2: 29 mEq/L (ref 19–32)
Calcium: 9.5 mg/dL (ref 8.4–10.5)
Chloride: 102 mEq/L (ref 96–112)
Creatinine, Ser: 1.2 mg/dL (ref 0.4–1.5)
GFR: 66.26 mL/min (ref >60.00)
Glucose, Bld: 95 mg/dL (ref 70–99)
Potassium: 3.5 mEq/L (ref 3.5–5.1)
Sodium: 138 mEq/L (ref 135–145)

## 2011-07-30 LAB — LIPID PANEL
Cholesterol: 169 mg/dL (ref 0–200)
HDL: 31.7 mg/dL — ABNORMAL LOW (ref >39.00)
LDL Cholesterol: 109 mg/dL — ABNORMAL HIGH (ref 0–99)
Total CHOL/HDL Ratio: 5
Triglycerides: 141 mg/dL (ref 0.0–149.0)
VLDL: 28.2 mg/dL (ref 0.0–40.0)

## 2011-07-30 MED ORDER — ALPRAZOLAM 1 MG PO TABS: 1.0000 mg | ORAL_TABLET | Freq: Four times a day (QID) | ORAL | Status: DC

## 2011-07-30 MED ORDER — FLUTICASONE PROPIONATE 50 MCG/ACT NA SUSP: 2.0000 | Freq: Every day | NASAL | Status: DC

## 2011-07-30 NOTE — Progress Notes (Signed)
William Elliott Date of Birth:  08/08/1940 [facility] [address] [place], [place]  [address] [phone]         Fax   [phone]  History of Present Illness: This pleasant 71 year old gentleman is seen for a four-month followup office visit.  He has a history of ischemic heart disease.  He had successful CABG in 1999.  He has a history of an asymptomatic left carotid bruit.  He has had prior Dopplers showing no significant stenosis.  The patient has a history of erectile dysfunction responsive to Viagra.  He has a history of essential hypertension and dyslipidemia.  His last echocardiogram was in 2008 showing diastolic dysfunction, aortic sclerosis and mild mitral regurgitation.  Current Outpatient Prescriptions  Medication Sig Dispense Refill  . ALPRAZolam (XANAX) 1 MG tablet Take 1 tablet (1 mg total) by mouth 4 (four) times daily.  120 tablet  2  . amLODipine (NORVASC) 5 MG tablet Take 1 tablet (5 mg total) by mouth daily.  30 tablet  11  . b complex vitamins tablet Take 1 tablet by mouth daily.      Marland Kitchen ezetimibe (ZETIA) 10 MG tablet Take 1 tablet (10 mg total) by mouth daily.  30 tablet  11  . fluticasone (FLONASE) 50 MCG/ACT nasal spray Place 2 sprays into the nose daily.  16 g  11  . hydrochlorothiazide (HYDRODIURIL) 25 MG tablet Take 1 tablet (25 mg total) by mouth daily.  30 tablet  11  . Ipratropium-Albuterol (COMBIVENT IN) Inhale 2 puffs into the lungs 2 (two) times daily.        Marland Kitchen losartan (COZAAR) 100 MG tablet Take 1 tablet (100 mg total) by mouth daily.  30 tablet  11  . metoprolol succinate (TOPROL-XL) 100 MG 24 hr tablet Take 1 tablet (100 mg total) by mouth daily.  30 tablet  11  . multivitamin (THERAGRAN) per tablet Take 1 tablet by mouth daily.        Marland Kitchen nystatin cream (MYCOSTATIN) APPLY TO RASH TWICE DAILY  30 g  5  . omega-3 acid ethyl esters (LOVAZA) 1 G capsule 2 twice daily  120 capsule  11  . potassium chloride SA (K-DUR,KLOR-CON) 20 MEQ  tablet Take 20 mEq by mouth daily.        . sildenafil (VIAGRA) 100 MG tablet Take 100 mg by mouth daily as needed.        . theophylline (THEODUR) 300 MG 12 hr tablet Take 1 tablet (300 mg total) by mouth 2 (two) times daily.  60 tablet  11  . triamcinolone cream (KENALOG) 0.1 % APPLY TO RASH TWICE DAILY  30 g  5  . albuterol-ipratropium (COMBIVENT) 18-103 MCG/ACT inhaler Inhale 2 puffs into the lungs every 6 (six) hours as needed for wheezing.  15 Inhaler  11  . DISCONTD: potassium chloride SA (K-DUR,KLOR-CON) 20 MEQ tablet Take 1 tablet (20 mEq total) by mouth daily as needed.  30 tablet  11    Allergies  Allergen Reactions  . Fenofibrate     No energy  . Lipitor (Atorvastatin Calcium)   . Niaspan (Niacin)   . Omacor (Omega-3-Acid Ethyl Esters)     rash  . Zocor (Simvastatin)     Patient Active Problem List  Diagnoses  . Coronary artery disease  . Hyperlipidemia  . Carotid bruit  . Ischemic heart disease  . Dyslipidemia  . COPD (chronic obstructive pulmonary disease)  . BPH associated with nocturia  History  Smoking status  . Former Smoker  . Types: Cigars  . Quit date: 03/02/1997  Smokeless tobacco  . Not on file    History  Alcohol Use No    Family History  Problem Relation Age of Onset  . Heart disease Father   . Heart attack Father   . Hypertension Mother   . Stroke Mother     Review of Systems: Constitutional: no fever chills diaphoresis or fatigue or change in weight.  Head and neck: no hearing loss, no epistaxis, no photophobia or visual disturbance. Respiratory: No cough, shortness of breath or wheezing. Cardiovascular: No chest pain peripheral edema, palpitations. Gastrointestinal: No abdominal distention, no abdominal pain, no change in bowel habits hematochezia or melena. Genitourinary: No dysuria, no frequency, no urgency, no nocturia. Musculoskeletal:No arthralgias, no back pain, no gait disturbance or myalgias. Neurological: No dizziness,  no headaches, no numbness, no seizures, no syncope, no weakness, no tremors. Hematologic: No lymphadenopathy, no easy bruising. Psychiatric: No confusion, no hallucinations, no sleep disturbance.    Physical Exam: Filed Vitals:   07/30/11 1340  BP: 122/72  Pulse: 52   the general appearance reveals a well-developed well-nourished gentleman in no distress.The head and neck exam reveals pupils equal and reactive.  Extraocular movements are full.  There is no scleral icterus.  The mouth and pharynx are normal.  The neck is supple.  The carotids reveal no bruits.  The jugular venous pressure is normal.  The  thyroid is not enlarged.  There is no lymphadenopathy.  The chest is clear to percussion and auscultation.  There are no rales or rhonchi.  Expansion of the chest is symmetrical.  The precordium is quiet.  The first heart sound is normal.  The second heart sound is physiologically split.  There is no murmur gallop rub or click.  There is no abnormal lift or heave.  The abdomen is soft and nontender.  The bowel sounds are normal.  The liver and spleen are not enlarged.  There are no abdominal masses.  There are no abdominal bruits.  Extremities reveal good pedal pulses.  There is no phlebitis or edema.  There is no cyanosis or clubbing.  Strength is normal and symmetrical in all extremities.  There is no lateralizing weakness.  There are no sensory deficits.  The skin is warm and dry.  There is no rash.     Assessment / Plan: Continue same medication.  We gave the patient a referral to Dr. Burgess Estelle to followup for ophthalmology.  The patient has a history of having had cataracts with implants last year while living out of town. Patient will return in 4 months for followup office visit and fasting lipid panel hepatic function panel and basal metabolic panel.

## 2011-07-30 NOTE — Telephone Encounter (Signed)
Reordered Viagra at [facility] Order # [id number]  ID # [phone]

## 2011-07-30 NOTE — Assessment & Plan Note (Signed)
The patient has not been expressing any recurrent angina pectoris.  He has not had to take any sublingual nitroglycerin 

## 2011-07-30 NOTE — Patient Instructions (Signed)
Will obtain labs today and call you with the results  Your physician recommends that you continue on your current medications as directed. Please refer to the Current Medication list given to you today.  Your physician wants you to follow-up in: 4 month You will receive a reminder letter in the mail two months in advance. If you don't receive a letter, please call our office to schedule the follow-up appointment.  

## 2011-07-30 NOTE — Assessment & Plan Note (Signed)
The patient has had no TIA symptoms. 

## 2011-07-30 NOTE — Progress Notes (Signed)
Quick Note:  Please report to patient. The recent labs are stable. Continue same medication and careful diet. ______ 

## 2011-07-30 NOTE — Assessment & Plan Note (Signed)
The patient has a history of dyslipidemia.  He is intolerant of statins.  We are checking blood work today

## 2011-08-11 ENCOUNTER — Telehealth: Payer: Self-pay | Admitting: *Deleted

## 2011-08-11 NOTE — Telephone Encounter (Signed)
Spoke with patient and advised Viagra from patient assistance ready to be picked up

## 2011-08-31 ENCOUNTER — Telehealth: Payer: Self-pay | Admitting: Cardiology

## 2011-08-31 MED ORDER — IPRATROPIUM-ALBUTEROL 20-100 MCG/ACT IN AERS: 1.0000 | INHALATION_SPRAY | Freq: Four times a day (QID) | RESPIRATORY_TRACT | Status: DC | PRN

## 2011-08-31 NOTE — Telephone Encounter (Signed)
Combivent he has been taking no longer available, ok to change to Combivent Respimat per  Dr. Patty Sermons. Advised patient

## 2011-08-31 NOTE — Telephone Encounter (Signed)
Please return patient call at hm# [phone] or listed cell, to discuss medication.

## 2011-09-29 DIAGNOSIS — Z961 Presence of intraocular lens: Secondary | ICD-10-CM | POA: Diagnosis not present

## 2011-09-29 DIAGNOSIS — H01009 Unspecified blepharitis unspecified eye, unspecified eyelid: Secondary | ICD-10-CM | POA: Diagnosis not present

## 2011-09-29 DIAGNOSIS — H43819 Vitreous degeneration, unspecified eye: Secondary | ICD-10-CM | POA: Diagnosis not present

## 2011-09-29 DIAGNOSIS — H2640 Unspecified secondary cataract: Secondary | ICD-10-CM | POA: Diagnosis not present

## 2011-11-04 ENCOUNTER — Other Ambulatory Visit: Payer: Self-pay | Admitting: *Deleted

## 2011-11-04 MED ORDER — POTASSIUM CHLORIDE CRYS ER 20 MEQ PO TBCR: 20.0000 meq | EXTENDED_RELEASE_TABLET | Freq: Every day | ORAL | Status: DC

## 2011-11-06 ENCOUNTER — Encounter: Payer: Self-pay | Admitting: Cardiology

## 2011-11-06 ENCOUNTER — Ambulatory Visit (INDEPENDENT_AMBULATORY_CARE_PROVIDER_SITE_OTHER): Payer: Medicare Other | Admitting: Cardiology

## 2011-11-06 VITALS — BP 120/82 | HR 60 | Ht 70.0 in | Wt 205.0 lb

## 2011-11-06 DIAGNOSIS — R0989 Other specified symptoms and signs involving the circulatory and respiratory systems: Secondary | ICD-10-CM

## 2011-11-06 DIAGNOSIS — I2510 Atherosclerotic heart disease of native coronary artery without angina pectoris: Secondary | ICD-10-CM

## 2011-11-06 DIAGNOSIS — E7800 Pure hypercholesterolemia, unspecified: Secondary | ICD-10-CM

## 2011-11-06 DIAGNOSIS — I119 Hypertensive heart disease without heart failure: Secondary | ICD-10-CM | POA: Diagnosis not present

## 2011-11-06 DIAGNOSIS — J449 Chronic obstructive pulmonary disease, unspecified: Secondary | ICD-10-CM

## 2011-11-06 DIAGNOSIS — J45909 Unspecified asthma, uncomplicated: Secondary | ICD-10-CM | POA: Diagnosis not present

## 2011-11-06 DIAGNOSIS — E785 Hyperlipidemia, unspecified: Secondary | ICD-10-CM

## 2011-11-06 DIAGNOSIS — Z951 Presence of aortocoronary bypass graft: Secondary | ICD-10-CM

## 2011-11-06 LAB — LIPID PANEL
Cholesterol: 183 mg/dL (ref 0–200)
HDL: 34.1 mg/dL — ABNORMAL LOW (ref >39.00)
LDL Cholesterol: 114 mg/dL — ABNORMAL HIGH (ref 0–99)
Total CHOL/HDL Ratio: 5
Triglycerides: 175 mg/dL — ABNORMAL HIGH (ref 0.0–149.0)
VLDL: 35 mg/dL (ref 0.0–40.0)

## 2011-11-06 LAB — BASIC METABOLIC PANEL
BUN: 14 mg/dL (ref 6–23)
CO2: 26 mEq/L (ref 19–32)
Calcium: 9.5 mg/dL (ref 8.4–10.5)
Chloride: 101 mEq/L (ref 96–112)
Creatinine, Ser: 1 mg/dL (ref 0.4–1.5)
GFR: 75.1 mL/min (ref >60.00)
Glucose, Bld: 89 mg/dL (ref 70–99)
Potassium: 3.8 mEq/L (ref 3.5–5.1)
Sodium: 136 mEq/L (ref 135–145)

## 2011-11-06 LAB — HEPATIC FUNCTION PANEL
ALT: 41 U/L (ref 0–53)
AST: 36 U/L (ref 0–37)
Albumin: 4 g/dL (ref 3.5–5.2)
Alkaline Phosphatase: 81 U/L (ref 39–117)
Bilirubin, Direct: 0.1 mg/dL (ref 0.0–0.3)
Total Bilirubin: 0.9 mg/dL (ref 0.3–1.2)
Total Protein: 7.7 g/dL (ref 6.0–8.3)

## 2011-11-06 NOTE — Assessment & Plan Note (Signed)
Patient has a history of dyslipidemia.  He is intolerant of statins but is on ezetimibe and is on omega-3 fatty acids.  Blood work today is pending

## 2011-11-06 NOTE — Progress Notes (Signed)
Quick Note:  Please report to patient. The recent labs are stable. Continue same medication and careful diet. LDL slightly higher. Work harder on diet. ______

## 2011-11-06 NOTE — Assessment & Plan Note (Signed)
The patient has not been experiencing any chest pain or angina.  He is trying to walk for exercise.  Since last visit he has gained 1 pound.

## 2011-11-06 NOTE — Patient Instructions (Signed)
Your physician recommends that you continue on your current medications as directed. Please refer to the Current Medication list given to you today.  Your physician wants you to follow-up in: 4 months with fasting labs (lp/bmet/hfp) You will receive a reminder letter in the mail two months in advance. If you don't receive a letter, please call our office to schedule the follow-up appointment.  Will obtain labs today and call you with the results

## 2011-11-06 NOTE — Assessment & Plan Note (Signed)
The patient has never smoked.  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  He also has a history of asthma since childhood.  He does use inhalers and is on Theodur.

## 2011-11-06 NOTE — Assessment & Plan Note (Signed)
The patient has not had any TIA symptoms or symptoms from his left carotid bruit.

## 2011-11-06 NOTE — Progress Notes (Signed)
Ruben Reason Date of Birth:  09/15/1940 [facility] [address] [place], [place]  [address] [phone]         Fax   [phone]  History of Present Illness: This pleasant 71 year old gentleman is seen for a four-month followup office visit. He has a history of ischemic heart disease. He had successful CABG in 1999. He has a history of an asymptomatic left carotid bruit. He has had prior Dopplers showing no significant stenosis. The patient has a history of erectile dysfunction responsive to Viagra. He has a history of essential hypertension and dyslipidemia. His last echocardiogram was in 2008 showing diastolic dysfunction, aortic sclerosis and mild mitral regurgitation.   Current Outpatient Prescriptions  Medication Sig Dispense Refill  . ALPRAZolam (XANAX) 1 MG tablet Take 1 tablet (1 mg total) by mouth 4 (four) times daily.  120 tablet  3  . amLODipine (NORVASC) 5 MG tablet Take 1 tablet (5 mg total) by mouth daily.  30 tablet  11  . b complex vitamins tablet Take 1 tablet by mouth daily.      Marland Kitchen ezetimibe (ZETIA) 10 MG tablet Take 1 tablet (10 mg total) by mouth daily.  30 tablet  11  . fluticasone (FLONASE) 50 MCG/ACT nasal spray Place 2 sprays into the nose daily.  16 g  3  . hydrochlorothiazide (HYDRODIURIL) 25 MG tablet Take 1 tablet (25 mg total) by mouth daily.  30 tablet  11  . Ipratropium-Albuterol (COMBIVENT) 20-100 MCG/ACT AERS respimat Inhale 1 puff into the lungs every 6 (six) hours as needed.  4 g  5  . losartan (COZAAR) 100 MG tablet Take 1 tablet (100 mg total) by mouth daily.  30 tablet  11  . metoprolol succinate (TOPROL-XL) 100 MG 24 hr tablet Take 1 tablet (100 mg total) by mouth daily.  30 tablet  11  . multivitamin (THERAGRAN) per tablet Take 1 tablet by mouth daily.        Marland Kitchen nystatin cream (MYCOSTATIN) APPLY TO RASH TWICE DAILY  30 g  5  . omega-3 acid ethyl esters (LOVAZA) 1 G capsule 2 twice daily  120 capsule  11  . potassium  chloride SA (K-DUR,KLOR-CON) 20 MEQ tablet Take 1 tablet (20 mEq total) by mouth daily.  30 tablet  5  . sildenafil (VIAGRA) 100 MG tablet Take 100 mg by mouth daily as needed.        . theophylline (THEODUR) 300 MG 12 hr tablet Take 1 tablet (300 mg total) by mouth 2 (two) times daily.  60 tablet  11  . triamcinolone cream (KENALOG) 0.1 % APPLY TO RASH TWICE DAILY  30 g  5    Allergies  Allergen Reactions  . Fenofibrate     No energy  . Lipitor (Atorvastatin Calcium)   . Niaspan (Niacin)   . Omacor (Omega-3-Acid Ethyl Esters)     rash  . Zocor (Simvastatin)     Patient Active Problem List  Diagnosis  . Coronary artery disease  . Hyperlipidemia  . Carotid bruit  . Ischemic heart disease  . Dyslipidemia  . COPD (chronic obstructive pulmonary disease)  . BPH associated with nocturia    History  Smoking status  . Former Smoker  . Types: Cigars  . Quit date: 03/02/1997  Smokeless tobacco  . Not on file    History  Alcohol Use No    Family History  Problem Relation Age of Onset  . Heart disease Father   .  Heart attack Father   . Hypertension Mother   . Stroke Mother     Review of Systems: Constitutional: no fever chills diaphoresis or fatigue or change in weight.  Head and neck: no hearing loss, no epistaxis, no photophobia or visual disturbance. Respiratory: No cough, shortness of breath or wheezing. Cardiovascular: No chest pain peripheral edema, palpitations. Gastrointestinal: No abdominal distention, no abdominal pain, no change in bowel habits hematochezia or melena. Genitourinary: No dysuria, no frequency, no urgency, no nocturia. Musculoskeletal:No arthralgias, no back pain, no gait disturbance or myalgias. Neurological: No dizziness, no headaches, no numbness, no seizures, no syncope, no weakness, no tremors. Hematologic: No lymphadenopathy, no easy bruising. Psychiatric: No confusion, no hallucinations, no sleep disturbance.    Physical Exam: Filed  Vitals:   11/06/11 1448  BP: 120/82  Pulse: 60   general appearance reveals a well-developed well-nourished gentleman in no distress.The head and neck exam reveals pupils equal and reactive.  Extraocular movements are full.  There is no scleral icterus.  The mouth and pharynx are normal.  The neck is supple.  The carotids reveal soft left carotid bruit.  The jugular venous pressure is normal.  The  thyroid is not enlarged.  There is no lymphadenopathy.  The chest is clear to percussion and auscultation.  There are no rales or rhonchi.  Expansion of the chest is symmetrical.  The precordium is quiet.  The first heart sound is normal.  The second heart sound is physiologically split.  There is no murmur gallop rub or click.  There is no abnormal lift or heave.  The abdomen is soft and nontender.  The bowel sounds are normal.  The liver and spleen are not enlarged.  There are no abdominal masses.  There are no abdominal bruits.  Extremities reveal good pedal pulses.  There is no phlebitis or edema.  There is no cyanosis or clubbing.  Strength is normal and symmetrical in all extremities.  There is no lateralizing weakness.  There are no sensory deficits.  The skin is warm and dry.  There is no rash.     Assessment / Plan: Continue same medication.  Recheck in 4 months for followup office visit lipid panel hepatic function panel and basal metabolic panel.  Needs to work harder on weight loss.

## 2011-11-10 ENCOUNTER — Telehealth: Payer: Self-pay | Admitting: *Deleted

## 2011-11-10 NOTE — Telephone Encounter (Signed)
Unable to reach via phone (no answer).  Mailed copy with  Dr. Yevonne Pax comments highlighted

## 2011-11-10 NOTE — Telephone Encounter (Signed)
Message copied by Burnell Blanks on Tue Nov 10, 2011  4:06 PM ------      Message from: Cassell Clement      Created: Fri Nov 06, 2011  9:38 PM       Please report to patient.  The recent labs are stable. Continue same medication and careful diet. LDL slightly higher. Work harder on diet.

## 2011-11-24 DIAGNOSIS — I259 Chronic ischemic heart disease, unspecified: Secondary | ICD-10-CM | POA: Diagnosis not present

## 2011-11-24 DIAGNOSIS — J449 Chronic obstructive pulmonary disease, unspecified: Secondary | ICD-10-CM | POA: Diagnosis not present

## 2011-11-24 DIAGNOSIS — E785 Hyperlipidemia, unspecified: Secondary | ICD-10-CM | POA: Diagnosis not present

## 2011-11-24 DIAGNOSIS — I10 Essential (primary) hypertension: Secondary | ICD-10-CM | POA: Diagnosis not present

## 2011-11-27 ENCOUNTER — Other Ambulatory Visit: Payer: Self-pay | Admitting: *Deleted

## 2011-11-27 DIAGNOSIS — F419 Anxiety disorder, unspecified: Secondary | ICD-10-CM

## 2011-11-29 MED ORDER — ALPRAZOLAM 1 MG PO TABS: 1.0000 mg | ORAL_TABLET | Freq: Four times a day (QID) | ORAL | Status: DC

## 2011-12-24 ENCOUNTER — Telehealth: Payer: Self-pay | Admitting: *Deleted

## 2011-12-24 NOTE — Telephone Encounter (Signed)
Reordered Viagra at [facility] Order # [id number] ID # [phone]

## 2012-01-22 ENCOUNTER — Telehealth: Payer: Self-pay | Admitting: Cardiology

## 2012-01-22 NOTE — Telephone Encounter (Signed)
Spoke with patient and he faxed over form for Xanax.  Will be looking for it

## 2012-01-22 NOTE — Telephone Encounter (Signed)
plz return call to pt [phone] regarding questions about medical care.

## 2012-03-21 ENCOUNTER — Other Ambulatory Visit: Payer: Self-pay

## 2012-03-21 DIAGNOSIS — I119 Hypertensive heart disease without heart failure: Secondary | ICD-10-CM

## 2012-03-21 MED ORDER — LOSARTAN POTASSIUM 100 MG PO TABS: 100.0000 mg | ORAL_TABLET | Freq: Every day | ORAL | Status: DC

## 2012-03-25 ENCOUNTER — Telehealth: Payer: Self-pay

## 2012-03-25 DIAGNOSIS — J449 Chronic obstructive pulmonary disease, unspecified: Secondary | ICD-10-CM

## 2012-03-25 DIAGNOSIS — J302 Other seasonal allergic rhinitis: Secondary | ICD-10-CM

## 2012-03-25 DIAGNOSIS — I10 Essential (primary) hypertension: Secondary | ICD-10-CM

## 2012-03-25 DIAGNOSIS — I119 Hypertensive heart disease without heart failure: Secondary | ICD-10-CM

## 2012-03-25 DIAGNOSIS — E7800 Pure hypercholesterolemia, unspecified: Secondary | ICD-10-CM

## 2012-03-25 MED ORDER — HYDROCHLOROTHIAZIDE 25 MG PO TABS: 25.0000 mg | ORAL_TABLET | Freq: Every day | ORAL | Status: DC

## 2012-03-29 ENCOUNTER — Ambulatory Visit: Payer: Medicare Other | Admitting: Cardiology

## 2012-03-29 MED ORDER — AMLODIPINE BESYLATE 5 MG PO TABS: 5.0000 mg | ORAL_TABLET | Freq: Every day | ORAL | Status: DC

## 2012-03-29 MED ORDER — EZETIMIBE 10 MG PO TABS: 10.0000 mg | ORAL_TABLET | Freq: Every day | ORAL | Status: DC

## 2012-03-29 MED ORDER — METOPROLOL SUCCINATE ER 100 MG PO TB24: 100.0000 mg | ORAL_TABLET | Freq: Every day | ORAL | Status: DC

## 2012-03-29 MED ORDER — POTASSIUM CHLORIDE CRYS ER 20 MEQ PO TBCR: 20.0000 meq | EXTENDED_RELEASE_TABLET | Freq: Every day | ORAL | Status: DC

## 2012-03-29 MED ORDER — THEOPHYLLINE ER 300 MG PO TB12: 300.0000 mg | ORAL_TABLET | Freq: Two times a day (BID) | ORAL | Status: DC

## 2012-03-29 MED ORDER — HYDROCHLOROTHIAZIDE 25 MG PO TABS: 25.0000 mg | ORAL_TABLET | Freq: Every day | ORAL | Status: DC

## 2012-03-29 MED ORDER — FLUTICASONE PROPIONATE 50 MCG/ACT NA SUSP: 2.0000 | Freq: Every day | NASAL | Status: DC

## 2012-03-29 NOTE — Telephone Encounter (Signed)
New Problem:   Refill:  Patient called in needing refills of his metoprolol succinate (TOPROL-XL) 100 MG 24 hr tablet, hydrochlorothiazide (HYDRODIURIL) 25 MG tablet, amLODipine (NORVASC) 5 MG tablet, ezetimibe (ZETIA) 10 MG tablet, potassium chloride SA (K-DUR,KLOR-CON) 20 MEQ tablet.   Melinda:  Patient also needs to have theophylline (THEODUR) 300 MG 12 hr tablet, fluticasone (FLONASE) 50 MCG/ACT nasal spray refilled.

## 2012-03-29 NOTE — Telephone Encounter (Signed)
Refilled as requested  

## 2012-04-01 ENCOUNTER — Other Ambulatory Visit: Payer: Self-pay | Admitting: Cardiology

## 2012-04-01 DIAGNOSIS — E7800 Pure hypercholesterolemia, unspecified: Secondary | ICD-10-CM

## 2012-04-01 DIAGNOSIS — F419 Anxiety disorder, unspecified: Secondary | ICD-10-CM

## 2012-04-01 NOTE — Telephone Encounter (Signed)
New Problem:    Patient called in needing a refill of his ALPRAZolam (XANAX) 1 MG tablet.

## 2012-04-02 MED ORDER — ALPRAZOLAM 1 MG PO TABS: 1.0000 mg | ORAL_TABLET | Freq: Four times a day (QID) | ORAL | Status: DC

## 2012-04-02 MED ORDER — OMEGA-3-ACID ETHYL ESTERS 1 G PO CAPS: ORAL_CAPSULE | ORAL | Status: DC

## 2012-04-08 ENCOUNTER — Ambulatory Visit (INDEPENDENT_AMBULATORY_CARE_PROVIDER_SITE_OTHER): Payer: Medicare Other | Admitting: Cardiology

## 2012-04-08 ENCOUNTER — Encounter: Payer: Self-pay | Admitting: Cardiology

## 2012-04-08 VITALS — BP 130/71 | HR 54 | Resp 18 | Ht 70.0 in | Wt 216.8 lb

## 2012-04-08 DIAGNOSIS — E7800 Pure hypercholesterolemia, unspecified: Secondary | ICD-10-CM

## 2012-04-08 DIAGNOSIS — J449 Chronic obstructive pulmonary disease, unspecified: Secondary | ICD-10-CM

## 2012-04-08 DIAGNOSIS — F419 Anxiety disorder, unspecified: Secondary | ICD-10-CM

## 2012-04-08 DIAGNOSIS — I119 Hypertensive heart disease without heart failure: Secondary | ICD-10-CM

## 2012-04-08 DIAGNOSIS — R0989 Other specified symptoms and signs involving the circulatory and respiratory systems: Secondary | ICD-10-CM

## 2012-04-08 DIAGNOSIS — I259 Chronic ischemic heart disease, unspecified: Secondary | ICD-10-CM

## 2012-04-08 DIAGNOSIS — F411 Generalized anxiety disorder: Secondary | ICD-10-CM | POA: Diagnosis not present

## 2012-04-08 DIAGNOSIS — E785 Hyperlipidemia, unspecified: Secondary | ICD-10-CM

## 2012-04-08 LAB — LIPID PANEL
Cholesterol: 186 mg/dL (ref 0–200)
HDL: 28.6 mg/dL — ABNORMAL LOW (ref >39.00)
Total CHOL/HDL Ratio: 7
Triglycerides: 222 mg/dL — ABNORMAL HIGH (ref 0.0–149.0)
VLDL: 44.4 mg/dL — ABNORMAL HIGH (ref 0.0–40.0)

## 2012-04-08 LAB — BASIC METABOLIC PANEL
BUN: 14 mg/dL (ref 6–23)
CO2: 31 mEq/L (ref 19–32)
Calcium: 9.5 mg/dL (ref 8.4–10.5)
Chloride: 100 mEq/L (ref 96–112)
Creatinine, Ser: 1.2 mg/dL (ref 0.4–1.5)
GFR: 66.13 mL/min (ref >60.00)
Glucose, Bld: 95 mg/dL (ref 70–99)
Potassium: 4.1 mEq/L (ref 3.5–5.1)
Sodium: 137 mEq/L (ref 135–145)

## 2012-04-08 LAB — LDL CHOLESTEROL, DIRECT: Direct LDL: 116.4 mg/dL

## 2012-04-08 LAB — HEPATIC FUNCTION PANEL
ALT: 38 U/L (ref 0–53)
AST: 32 U/L (ref 0–37)
Albumin: 3.9 g/dL (ref 3.5–5.2)
Alkaline Phosphatase: 68 U/L (ref 39–117)
Bilirubin, Direct: 0 mg/dL (ref 0.0–0.3)
Total Bilirubin: 0.9 mg/dL (ref 0.3–1.2)
Total Protein: 7.7 g/dL (ref 6.0–8.3)

## 2012-04-08 MED ORDER — POTASSIUM CHLORIDE CRYS ER 20 MEQ PO TBCR: 20.0000 meq | EXTENDED_RELEASE_TABLET | Freq: Every day | ORAL | Status: DC

## 2012-04-08 MED ORDER — ALPRAZOLAM 1 MG PO TABS: 1.0000 mg | ORAL_TABLET | Freq: Four times a day (QID) | ORAL | Status: DC

## 2012-04-08 MED ORDER — IPRATROPIUM-ALBUTEROL 18-103 MCG/ACT IN AERO: INHALATION_SPRAY | RESPIRATORY_TRACT | Status: DC

## 2012-04-08 MED ORDER — NITROGLYCERIN 0.4 MG SL SUBL: 0.4000 mg | SUBLINGUAL_TABLET | SUBLINGUAL | Status: DC | PRN

## 2012-04-08 NOTE — Progress Notes (Signed)
Ruben Reason Date of Birth:  03/25/1940 [facility] [address] [place], [place]  [address] [phone]         Fax   [phone]  History of Present Illness: This pleasant 72 year old gentleman is seen for a four-month followup office visit. He has a history of ischemic heart disease. He had successful CABG in 1999. He has a history of an asymptomatic left carotid bruit. He has had prior Dopplers showing no significant stenosis. The patient has a history of erectile dysfunction responsive to Viagra. He has a history of essential hypertension and dyslipidemia. His last echocardiogram was in 2008 showing diastolic dysfunction, aortic sclerosis and mild mitral regurgitation.  He has a history of COPD and asthma and is on generic Combivent inhaler   Current Outpatient Prescriptions  Medication Sig Dispense Refill  . ALPRAZolam (XANAX) 1 MG tablet Take 1 tablet (1 mg total) by mouth 4 (four) times daily.  120 tablet  1  . amLODipine (NORVASC) 5 MG tablet Take 1 tablet (5 mg total) by mouth daily.  30 tablet  11  . b complex vitamins tablet Take 1 tablet by mouth daily.      Marland Kitchen ezetimibe (ZETIA) 10 MG tablet Take 1 tablet (10 mg total) by mouth daily.  30 tablet  11  . fluticasone (FLONASE) 50 MCG/ACT nasal spray Place 2 sprays into the nose daily.  16 g  3  . hydrochlorothiazide (HYDRODIURIL) 25 MG tablet Take 1 tablet (25 mg total) by mouth daily.  30 tablet  11  . losartan (COZAAR) 100 MG tablet Take 1 tablet (100 mg total) by mouth daily.  30 tablet  11  . metoprolol succinate (TOPROL-XL) 100 MG 24 hr tablet Take 1 tablet (100 mg total) by mouth daily.  30 tablet  11  . multivitamin (THERAGRAN) per tablet Take 1 tablet by mouth daily.        Marland Kitchen nystatin cream (MYCOSTATIN) APPLY TO RASH TWICE DAILY  30 g  5  . omega-3 acid ethyl esters (LOVAZA) 1 G capsule 2 twice daily  120 capsule  11  . potassium chloride SA (K-DUR,KLOR-CON) 20 MEQ tablet Take 1 tablet (20 mEq  total) by mouth daily.  90 tablet  3  . sildenafil (VIAGRA) 100 MG tablet Take 100 mg by mouth daily as needed.        . theophylline (THEODUR) 300 MG 12 hr tablet Take 1 tablet (300 mg total) by mouth 2 (two) times daily.  60 tablet  11  . triamcinolone cream (KENALOG) 0.1 % APPLY TO RASH TWICE DAILY  30 g  5  . albuterol-ipratropium (COMBIVENT) 18-103 MCG/ACT inhaler 1 to 2 puffs 4 times a day as needed  14.7 g  11  . nitroGLYCERIN (NITROSTAT) 0.4 MG SL tablet Place 1 tablet (0.4 mg total) under the tongue every 5 (five) minutes as needed for chest pain.  90 tablet  3    Allergies  Allergen Reactions  . Fenofibrate     No energy  . Lipitor (Atorvastatin Calcium)   . Niaspan (Niacin)   . Omacor (Omega-3-Acid Ethyl Esters)     rash  . Zocor (Simvastatin)     Patient Active Problem List  Diagnosis  . Coronary artery disease  . Hyperlipidemia  . Carotid bruit  . Ischemic heart disease  . Dyslipidemia  . COPD (chronic obstructive pulmonary disease)  . BPH associated with nocturia    History  Smoking status  . Former Smoker  .  Types: Cigars  . Quit date: 03/02/1997  Smokeless tobacco  . Not on file    History  Alcohol Use No    Family History  Problem Relation Age of Onset  . Heart disease Father   . Heart attack Father   . Hypertension Mother   . Stroke Mother     Review of Systems: Constitutional: no fever chills diaphoresis or fatigue or change in weight.  Head and neck: no hearing loss, no epistaxis, no photophobia or visual disturbance. Respiratory: No cough, shortness of breath or wheezing. Cardiovascular: No chest pain peripheral edema, palpitations. Gastrointestinal: No abdominal distention, no abdominal pain, no change in bowel habits hematochezia or melena. Genitourinary: No dysuria, no frequency, no urgency, no nocturia. Musculoskeletal:No arthralgias, no back pain, no gait disturbance or myalgias. Neurological: No dizziness, no headaches, no  numbness, no seizures, no syncope, no weakness, no tremors. Hematologic: No lymphadenopathy, no easy bruising. Psychiatric: No confusion, no hallucinations, no sleep disturbance.    Physical Exam: Filed Vitals:   04/08/12 1353  BP: 130/71  Pulse: 54  Resp: 18   the general appearance reveals a well-developed well-nourished gentleman in no distress.  He is overweight.The head and neck exam reveals pupils equal and reactive.  Extraocular movements are full.  There is no scleral icterus.  The mouth and pharynx are normal.  The neck is supple.  The carotids reveal no bruits.  The jugular venous pressure is normal.  The  thyroid is not enlarged.  There is no lymphadenopathy.  The chest is clear to percussion and auscultation.  There are no rales or rhonchi.  Expansion of the chest is symmetrical.  The precordium is quiet.  The first heart sound is normal.  The second heart sound is physiologically split.  There is no murmur gallop rub or click.  There is no abnormal lift or heave.  The abdomen is soft and nontender.  The bowel sounds are normal.  The liver and spleen are not enlarged.  There are no abdominal masses.  There are no abdominal bruits.  Extremities reveal good pedal pulses.  There is no phlebitis or edema.  There is no cyanosis or clubbing.  Strength is normal and symmetrical in all extremities.  There is no lateralizing weakness.  There are no sensory deficits.  The skin is warm and dry.  There is no rash.     Assessment / Plan: Continue on same medication.  Recheck in 4 months for followup office visit lipid panel hepatic function panel and basal metabolic panel.  Blood work today is pending.  He needs to work harder on diet and weight loss between now and next visit.

## 2012-04-08 NOTE — Assessment & Plan Note (Signed)
The patient has a history of hyperlipidemia.  We are checking fasting lab work today.  He is intolerant to statins but is able to take ezetimibe and lovasa.

## 2012-04-08 NOTE — Patient Instructions (Addendum)
Will obtain labs today and call you with the results (LP/BMET/HFP)  Your physician recommends that you continue on your current medications as directed. Please refer to the Current Medication list given to you today.  Your physician recommends that you schedule a follow-up appointment in: 4 months with fasting labs (lp/bmet/hfp)  

## 2012-04-08 NOTE — Progress Notes (Signed)
Quick Note:  Please report to patient. The recent labs are stable. Continue same medication and careful diet. ______ 

## 2012-04-08 NOTE — Assessment & Plan Note (Signed)
The patient has not been experiencing any chest pain or recurrent angina.  He has had some mild peripheral edema.  He has been sedentary and in active over the winter and has gained 11 pounds since we last saw him 4 months ago

## 2012-04-08 NOTE — Assessment & Plan Note (Signed)
The patient has a history of COPD with wheezing and asthma.  He has been responding to Combivent inhaler which we refilled today.  No sputum production.  No fever or chills or hemoptysis.  He is a nonsmoker.

## 2012-04-08 NOTE — Assessment & Plan Note (Signed)
The patient has not been experiencing any TIA symptoms. 

## 2012-04-12 ENCOUNTER — Telehealth: Payer: Self-pay | Admitting: *Deleted

## 2012-04-12 NOTE — Telephone Encounter (Signed)
Advised patient of lab results  

## 2012-04-12 NOTE — Telephone Encounter (Signed)
Message copied by Burnell Blanks on Tue Apr 12, 2012  1:58 PM ------      Message from: Cassell Clement      Created: Fri Apr 08, 2012  9:27 PM       Please report to patient.  The recent labs are stable. Continue same medication and careful diet. ------

## 2012-06-01 DIAGNOSIS — J449 Chronic obstructive pulmonary disease, unspecified: Secondary | ICD-10-CM | POA: Diagnosis not present

## 2012-06-01 DIAGNOSIS — E785 Hyperlipidemia, unspecified: Secondary | ICD-10-CM | POA: Diagnosis not present

## 2012-06-01 DIAGNOSIS — I259 Chronic ischemic heart disease, unspecified: Secondary | ICD-10-CM | POA: Diagnosis not present

## 2012-06-01 DIAGNOSIS — I10 Essential (primary) hypertension: Secondary | ICD-10-CM | POA: Diagnosis not present

## 2012-08-08 ENCOUNTER — Other Ambulatory Visit: Payer: Self-pay | Admitting: Cardiology

## 2012-08-19 ENCOUNTER — Other Ambulatory Visit (INDEPENDENT_AMBULATORY_CARE_PROVIDER_SITE_OTHER): Payer: Medicare Other

## 2012-08-19 ENCOUNTER — Ambulatory Visit (INDEPENDENT_AMBULATORY_CARE_PROVIDER_SITE_OTHER): Payer: Medicare Other | Admitting: Cardiology

## 2012-08-19 ENCOUNTER — Encounter: Payer: Self-pay | Admitting: Cardiology

## 2012-08-19 VITALS — BP 130/78 | HR 60 | Ht 70.0 in | Wt 218.1 lb

## 2012-08-19 DIAGNOSIS — I259 Chronic ischemic heart disease, unspecified: Secondary | ICD-10-CM | POA: Diagnosis not present

## 2012-08-19 DIAGNOSIS — R0989 Other specified symptoms and signs involving the circulatory and respiratory systems: Secondary | ICD-10-CM | POA: Diagnosis not present

## 2012-08-19 DIAGNOSIS — E7800 Pure hypercholesterolemia, unspecified: Secondary | ICD-10-CM | POA: Diagnosis not present

## 2012-08-19 DIAGNOSIS — E785 Hyperlipidemia, unspecified: Secondary | ICD-10-CM | POA: Diagnosis not present

## 2012-08-19 LAB — BASIC METABOLIC PANEL
BUN: 16 mg/dL (ref 6–23)
CO2: 27 mEq/L (ref 19–32)
Calcium: 9.7 mg/dL (ref 8.4–10.5)
Chloride: 102 mEq/L (ref 96–112)
Creatinine, Ser: 1.1 mg/dL (ref 0.4–1.5)
GFR: 67.4 mL/min (ref >60.00)
Glucose, Bld: 103 mg/dL — ABNORMAL HIGH (ref 70–99)
Potassium: 3.3 mEq/L — ABNORMAL LOW (ref 3.5–5.1)
Sodium: 135 mEq/L (ref 135–145)

## 2012-08-19 LAB — LDL CHOLESTEROL, DIRECT: Direct LDL: 142.8 mg/dL

## 2012-08-19 LAB — LIPID PANEL
Cholesterol: 202 mg/dL — ABNORMAL HIGH (ref 0–200)
HDL: 33.6 mg/dL — ABNORMAL LOW (ref >39.00)
Total CHOL/HDL Ratio: 6
Triglycerides: 163 mg/dL — ABNORMAL HIGH (ref 0.0–149.0)
VLDL: 32.6 mg/dL (ref 0.0–40.0)

## 2012-08-19 LAB — HEPATIC FUNCTION PANEL
ALT: 39 U/L (ref 0–53)
AST: 31 U/L (ref 0–37)
Albumin: 3.9 g/dL (ref 3.5–5.2)
Alkaline Phosphatase: 59 U/L (ref 39–117)
Bilirubin, Direct: 0 mg/dL (ref 0.0–0.3)
Total Bilirubin: 0.9 mg/dL (ref 0.3–1.2)
Total Protein: 7.6 g/dL (ref 6.0–8.3)

## 2012-08-19 NOTE — Patient Instructions (Addendum)
Continue same medications    Lose Weight   Low Carb Diet   Your physician wants you to follow-up in: 4 months with fasting lab work You will receive a reminder letter in the mail two months in advance. If you don't receive a letter, please call our office to schedule the follow-up appointment.

## 2012-08-19 NOTE — Progress Notes (Signed)
William Elliott Date of Birth:  21-Sep-1940 [facility] [address] [place], [place]  [address] [phone]         Fax   [phone]  History of Present Illness: This pleasant 72 year old gentleman is seen for a four-month followup office visit. He has a history of ischemic heart disease. He had successful CABG in 1999. He has a history of an asymptomatic left carotid bruit. He has had prior Dopplers showing no significant stenosis. The patient has a history of erectile dysfunction responsive to Viagra. He has a history of essential hypertension and dyslipidemia. His last echocardiogram was in 2008 showing diastolic dysfunction, aortic sclerosis and mild mitral regurgitation. He has a history of COPD and asthma and is on generic Combivent inhaler.  The patient has not been getting much exercise.  He has continued to gradually gaining weight.   Current Outpatient Prescriptions  Medication Sig Dispense Refill  . albuterol-ipratropium (COMBIVENT) 18-103 MCG/ACT inhaler 1 to 2 puffs 4 times a day as needed  14.7 g  11  . ALPRAZolam (XANAX) 1 MG tablet Take 1 tablet (1 mg total) by mouth 4 (four) times daily.  120 tablet  5  . amLODipine (NORVASC) 5 MG tablet Take 1 tablet (5 mg total) by mouth daily.  30 tablet  11  . aspirin 81 MG tablet Take 81 mg by mouth daily.      Marland Kitchen b complex vitamins tablet Take 1 tablet by mouth daily.      . Cholecalciferol 5000 UNITS capsule Take 5,000 Units by mouth daily.      Marland Kitchen ezetimibe (ZETIA) 10 MG tablet Take 1 tablet (10 mg total) by mouth daily.  30 tablet  11  . fluticasone (FLONASE) 50 MCG/ACT nasal spray Place 2 sprays into the nose daily.  16 g  3  . hydrochlorothiazide (HYDRODIURIL) 25 MG tablet Take 1 tablet (25 mg total) by mouth daily.  30 tablet  11  . losartan (COZAAR) 100 MG tablet Take 1 tablet (100 mg total) by mouth daily.  30 tablet  11  . metoprolol succinate (TOPROL-XL) 100 MG 24 hr tablet Take 1 tablet (100 mg total)  by mouth daily.  30 tablet  11  . multivitamin (THERAGRAN) per tablet Take 1 tablet by mouth daily.        . nitroGLYCERIN (NITROSTAT) 0.4 MG SL tablet Place 1 tablet (0.4 mg total) under the tongue every 5 (five) minutes as needed for chest pain.  90 tablet  3  . nystatin cream (MYCOSTATIN)       . omega-3 acid ethyl esters (LOVAZA) 1 G capsule 2 twice daily  120 capsule  11  . potassium chloride SA (K-DUR,KLOR-CON) 20 MEQ tablet Take 1 tablet (20 mEq total) by mouth daily.  90 tablet  3  . sildenafil (VIAGRA) 100 MG tablet Take 100 mg by mouth daily as needed.        . theophylline (THEODUR) 300 MG 12 hr tablet Take 1 tablet (300 mg total) by mouth 2 (two) times daily.  60 tablet  11  . triamcinolone cream (KENALOG) 0.1 %       . vitamin C (ASCORBIC ACID) 500 MG tablet Take 500 mg by mouth daily.       No current facility-administered medications for this visit.    Allergies  Allergen Reactions  . Fenofibrate     No energy  . Lipitor (Atorvastatin Calcium)   . Niaspan (Niacin)   . Omacor (Omega-3-Acid  Ethyl Esters)     rash  . Zocor (Simvastatin)     Patient Active Problem List   Diagnosis Date Noted  . Coronary artery disease     Priority: High  . Carotid bruit     Priority: Medium  . Dyslipidemia     Priority: Medium  . BPH associated with nocturia 03/17/2011  . Hyperlipidemia   . Ischemic heart disease   . COPD (chronic obstructive pulmonary disease)     History  Smoking status  . Former Smoker  . Types: Cigars  . Quit date: 03/02/1997  Smokeless tobacco  . Not on file    History  Alcohol Use No    Family History  Problem Relation Age of Onset  . Heart disease Father   . Heart attack Father   . Hypertension Mother   . Stroke Mother     Review of Systems: Constitutional: no fever chills diaphoresis or fatigue or change in weight.  Head and neck: no hearing loss, no epistaxis, no photophobia or visual disturbance. Respiratory: No cough, shortness of  breath or wheezing. Cardiovascular: No chest pain peripheral edema, palpitations. Gastrointestinal: No abdominal distention, no abdominal pain, no change in bowel habits hematochezia or melena. Genitourinary: No dysuria, no frequency, no urgency, no nocturia. Musculoskeletal:No arthralgias, no back pain, no gait disturbance or myalgias. Neurological: No dizziness, no headaches, no numbness, no seizures, no syncope, no weakness, no tremors. Hematologic: No lymphadenopathy, no easy bruising. Psychiatric: No confusion, no hallucinations, no sleep disturbance.    Physical Exam: Filed Vitals:   08/19/12 1148  BP: 130/78  Pulse: 60   the general appearance reveals a well-developed mildly overweight gentleman in no distress.The head and neck exam reveals pupils equal and reactive.  Extraocular movements are full.  There is no scleral icterus.  The mouth and pharynx are normal.  The neck is supple.  The carotids reveal soft left carotid bruit.  The jugular venous pressure is normal.  The  thyroid is not enlarged.  There is no lymphadenopathy.  The chest is clear to percussion and auscultation.  There are no rales or rhonchi.  Expansion of the chest is symmetrical.  The precordium is quiet.  The first heart sound is normal.  The second heart sound is physiologically split.  There is no murmur gallop rub or click.  There is no abnormal lift or heave.  The abdomen is soft and nontender.  The bowel sounds are normal.  The liver and spleen are not enlarged.  There are no abdominal masses.  There are no abdominal bruits.  Extremities reveal good pedal pulses.  There is no phlebitis or edema.  There is no cyanosis or clubbing.  Strength is normal and symmetrical in all extremities.  There is no lateralizing weakness.  There are no sensory deficits.  The skin is warm and dry.  There is no rash.     Assessment / Plan: Continue same medication.  Work harder on weight loss and exercise.  We refilled 2 of his  medications today, Xanax and Flonase. Return in 4 months for followup office visit lipid panel hepatic function panel and basal metabolic panel.

## 2012-08-19 NOTE — Assessment & Plan Note (Signed)
The patient has not had any TIA symptoms from his known mild left carotid bruit.

## 2012-08-19 NOTE — Assessment & Plan Note (Signed)
Patient has a history of hypercholesterolemia.  He is intolerant of statins.  He is on ezetimibe and on omega-3 fatty acids.  Blood work today is pending.

## 2012-08-19 NOTE — Assessment & Plan Note (Signed)
The patient is not having any recurrent chest pain or angina 

## 2012-08-22 ENCOUNTER — Telehealth: Payer: Self-pay | Admitting: Cardiology

## 2012-08-22 ENCOUNTER — Other Ambulatory Visit: Payer: Self-pay | Admitting: Cardiology

## 2012-08-22 MED ORDER — POTASSIUM CHLORIDE CRYS ER 20 MEQ PO TBCR: 20.0000 meq | EXTENDED_RELEASE_TABLET | Freq: Two times a day (BID) | ORAL | Status: DC

## 2012-08-22 NOTE — Telephone Encounter (Signed)
Message copied by Burnell Blanks on Mon Aug 22, 2012  3:28 PM ------      Message from: Cassell Clement      Created: Sun Aug 21, 2012  3:47 PM       Cholesterol and LDL higher. Work harder on diet and weight loss.      Potassium too low.  Increase KDur to 20 meq BID ------

## 2012-08-22 NOTE — Telephone Encounter (Signed)
Advised patient of lab results and increase in K+

## 2012-09-14 ENCOUNTER — Telehealth: Payer: Self-pay | Admitting: Cardiology

## 2012-09-14 NOTE — Telephone Encounter (Signed)
New problem   Pt cannot remember when he's to stop taking plavix

## 2012-09-29 ENCOUNTER — Other Ambulatory Visit: Payer: Self-pay | Admitting: *Deleted

## 2012-09-29 DIAGNOSIS — F419 Anxiety disorder, unspecified: Secondary | ICD-10-CM

## 2012-09-29 MED ORDER — ALPRAZOLAM 1 MG PO TABS: 1.0000 mg | ORAL_TABLET | Freq: Four times a day (QID) | ORAL | Status: DC

## 2012-09-29 NOTE — Telephone Encounter (Signed)
Left message to call back  Reviewed paper chart and Plavix d/c August 01, 2009 secondary to rectal bleeding.   Samples ready for pick up

## 2012-09-29 NOTE — Telephone Encounter (Signed)
Advised patient

## 2012-11-11 ENCOUNTER — Telehealth: Payer: Self-pay | Admitting: Cardiology

## 2012-11-11 NOTE — Telephone Encounter (Signed)
Patient had a white place on his lip and he popped it with a pin, put ice on it and it went away. Now he has a few places, one is red and sore. Quit taking vitamin C and D secondary to thinking this may be causing. Concerned it may be coming from one of his medications. Will forward to  Dr. Patty Sermons for review

## 2012-11-11 NOTE — Telephone Encounter (Signed)
Advised patient

## 2012-11-11 NOTE — Telephone Encounter (Signed)
New Problem  Alergic reaction to medication/// white pimples on his lip//

## 2012-11-11 NOTE — Telephone Encounter (Signed)
Having a reaction to my meds.  Have white pimples coming up on my lips .

## 2012-11-11 NOTE — Telephone Encounter (Signed)
I doubt if the skin lesions are related to his medicine.  I would recommend that he see a dermatologist for a more precise diagnosis.

## 2012-11-15 DIAGNOSIS — L0100 Impetigo, unspecified: Secondary | ICD-10-CM | POA: Diagnosis not present

## 2012-12-30 ENCOUNTER — Ambulatory Visit (INDEPENDENT_AMBULATORY_CARE_PROVIDER_SITE_OTHER): Payer: Medicare Other | Admitting: Cardiology

## 2012-12-30 ENCOUNTER — Other Ambulatory Visit: Payer: Medicare Other

## 2012-12-30 ENCOUNTER — Encounter: Payer: Self-pay | Admitting: Cardiology

## 2012-12-30 VITALS — BP 120/80 | HR 64 | Ht 70.0 in | Wt 222.0 lb

## 2012-12-30 DIAGNOSIS — N138 Other obstructive and reflux uropathy: Secondary | ICD-10-CM | POA: Diagnosis not present

## 2012-12-30 DIAGNOSIS — I259 Chronic ischemic heart disease, unspecified: Secondary | ICD-10-CM | POA: Diagnosis not present

## 2012-12-30 DIAGNOSIS — J4489 Other specified chronic obstructive pulmonary disease: Secondary | ICD-10-CM

## 2012-12-30 DIAGNOSIS — E7800 Pure hypercholesterolemia, unspecified: Secondary | ICD-10-CM

## 2012-12-30 DIAGNOSIS — I119 Hypertensive heart disease without heart failure: Secondary | ICD-10-CM | POA: Diagnosis not present

## 2012-12-30 DIAGNOSIS — N401 Enlarged prostate with lower urinary tract symptoms: Secondary | ICD-10-CM

## 2012-12-30 DIAGNOSIS — J449 Chronic obstructive pulmonary disease, unspecified: Secondary | ICD-10-CM

## 2012-12-30 DIAGNOSIS — E785 Hyperlipidemia, unspecified: Secondary | ICD-10-CM

## 2012-12-30 LAB — PSA: PSA: 0.4 ng/mL (ref 0.10–4.00)

## 2012-12-30 LAB — BASIC METABOLIC PANEL
BUN: 11 mg/dL (ref 6–23)
CO2: 30 mEq/L (ref 19–32)
Calcium: 9.8 mg/dL (ref 8.4–10.5)
Chloride: 97 mEq/L (ref 96–112)
Creatinine, Ser: 1 mg/dL (ref 0.4–1.5)
GFR: 77.43 mL/min (ref >60.00)
Glucose, Bld: 100 mg/dL — ABNORMAL HIGH (ref 70–99)
Potassium: 4 mEq/L (ref 3.5–5.1)
Sodium: 134 mEq/L — ABNORMAL LOW (ref 135–145)

## 2012-12-30 LAB — LIPID PANEL
Cholesterol: 188 mg/dL (ref 0–200)
HDL: 30.7 mg/dL — ABNORMAL LOW (ref >39.00)
LDL Cholesterol: 120 mg/dL — ABNORMAL HIGH (ref 0–99)
Total CHOL/HDL Ratio: 6
Triglycerides: 188 mg/dL — ABNORMAL HIGH (ref 0.0–149.0)
VLDL: 37.6 mg/dL (ref 0.0–40.0)

## 2012-12-30 LAB — HEPATIC FUNCTION PANEL
ALT: 41 U/L (ref 0–53)
AST: 30 U/L (ref 0–37)
Albumin: 4 g/dL (ref 3.5–5.2)
Alkaline Phosphatase: 61 U/L (ref 39–117)
Bilirubin, Direct: 0.1 mg/dL (ref 0.0–0.3)
Total Bilirubin: 1 mg/dL (ref 0.3–1.2)
Total Protein: 7.2 g/dL (ref 6.0–8.3)

## 2012-12-30 NOTE — Assessment & Plan Note (Signed)
The patient has not been experiencing any recent chest pain or angina pectoris. 

## 2012-12-30 NOTE — Progress Notes (Signed)
William Elliott Date of Birth:  01-23-1941 [address] [place], [place]  [address] [phone]         Fax   [phone]  History of Present Illness: This pleasant 72 year old gentleman is seen for a four-month followup office visit. He has a history of ischemic heart disease. He had successful CABG in 1999. He has a history of an asymptomatic left carotid bruit. He has had prior Dopplers showing no significant stenosis. The patient has a history of erectile dysfunction responsive to Viagra. He has a history of essential hypertension and dyslipidemia. His last echocardiogram was in 2008 showing diastolic dysfunction, aortic sclerosis and mild mitral regurgitation. He has a history of COPD and asthma and is on generic Combivent inhaler.  The patient has not been getting much exercise.  He has continued to gradually gaining weight.  His weight is up 4 pounds since last visit.  He has a new PCP who is Dr. Dwana Melena.   Current Outpatient Prescriptions  Medication Sig Dispense Refill  . albuterol-ipratropium (COMBIVENT) 18-103 MCG/ACT inhaler 1 to 2 puffs 4 times a day as needed  14.7 g  11  . ALPRAZolam (XANAX) 1 MG tablet Take 1 tablet (1 mg total) by mouth 4 (four) times daily.  120 tablet  5  . amLODipine (NORVASC) 5 MG tablet Take 1 tablet (5 mg total) by mouth daily.  30 tablet  11  . aspirin 81 MG tablet Take 81 mg by mouth daily.      Marland Kitchen b complex vitamins tablet Take 1 tablet by mouth daily.      . Cholecalciferol 5000 UNITS capsule Take 5,000 Units by mouth daily.      Marland Kitchen ezetimibe (ZETIA) 10 MG tablet Take 1 tablet (10 mg total) by mouth daily.  30 tablet  11  . fluticasone (FLONASE) 50 MCG/ACT nasal spray USE 2 SPRAYS INTO THE NOSE DAILY.  16 g  5  . hydrochlorothiazide (HYDRODIURIL) 25 MG tablet Take 1 tablet (25 mg total) by mouth daily.  30 tablet  11  . losartan (COZAAR) 100 MG tablet Take 1 tablet (100 mg total) by mouth daily.  30 tablet  11  . metoprolol  succinate (TOPROL-XL) 100 MG 24 hr tablet Take 1 tablet (100 mg total) by mouth daily.  30 tablet  11  . multivitamin (THERAGRAN) per tablet Take 1 tablet by mouth daily.        . nitroGLYCERIN (NITROSTAT) 0.4 MG SL tablet Place 1 tablet (0.4 mg total) under the tongue every 5 (five) minutes as needed for chest pain.  90 tablet  3  . nystatin cream (MYCOSTATIN)       . omega-3 acid ethyl esters (LOVAZA) 1 G capsule 2 twice daily  120 capsule  11  . potassium chloride SA (K-DUR,KLOR-CON) 20 MEQ tablet Take 1 tablet (20 mEq total) by mouth 2 (two) times daily.  180 tablet  3  . sildenafil (VIAGRA) 100 MG tablet Take 100 mg by mouth daily as needed.        . theophylline (THEODUR) 300 MG 12 hr tablet Take 1 tablet (300 mg total) by mouth 2 (two) times daily.  60 tablet  11  . triamcinolone cream (KENALOG) 0.1 %       . vitamin C (ASCORBIC ACID) 500 MG tablet Take 500 mg by mouth daily.       No current facility-administered medications for this visit.    Allergies  Allergen Reactions  .  Fenofibrate     No energy  . Lipitor [Atorvastatin Calcium]   . Niaspan [Niacin]   . Omacor [Omega-3-Acid Ethyl Esters]     rash  . Zocor [Simvastatin]     Patient Active Problem List   Diagnosis Date Noted  . Coronary artery disease     Priority: High  . Carotid bruit     Priority: Medium  . Dyslipidemia     Priority: Medium  . BPH associated with nocturia 03/17/2011  . Hyperlipidemia   . Ischemic heart disease   . COPD (chronic obstructive pulmonary disease)     History  Smoking status  . Former Smoker  . Types: Cigars  . Quit date: 03/02/1997  Smokeless tobacco  . Not on file    History  Alcohol Use No    Family History  Problem Relation Age of Onset  . Heart disease Father   . Heart attack Father   . Hypertension Mother   . Stroke Mother     Review of Systems: Constitutional: no fever chills diaphoresis or fatigue or change in weight.  Head and neck: no hearing loss, no  epistaxis, no photophobia or visual disturbance. Respiratory: No cough, shortness of breath or wheezing. Cardiovascular: No chest pain peripheral edema, palpitations. Gastrointestinal: No abdominal distention, no abdominal pain, no change in bowel habits hematochezia or melena. Genitourinary: No dysuria, no frequency, no urgency, no nocturia. Musculoskeletal:No arthralgias, no back pain, no gait disturbance or myalgias. Neurological: No dizziness, no headaches, no numbness, no seizures, no syncope, no weakness, no tremors. Hematologic: No lymphadenopathy, no easy bruising. Psychiatric: No confusion, no hallucinations, no sleep disturbance.    Physical Exam: Filed Vitals:   12/30/12 1219  BP: 120/80  Pulse: 64   the general appearance reveals a well-developed mildly overweight gentleman in no distress.The head and neck exam reveals pupils equal and reactive.  Extraocular movements are full.  There is no scleral icterus.  The mouth and pharynx are normal.  The neck is supple.  The carotids reveal soft left carotid bruit.  The jugular venous pressure is normal.  The  thyroid is not enlarged.  There is no lymphadenopathy.  The chest is clear to percussion and auscultation.  There are no rales or rhonchi.  Expansion of the chest is symmetrical.  The precordium is quiet.  The first heart sound is normal.  The second heart sound is physiologically split.  There is no murmur gallop rub or click.  There is no abnormal lift or heave.  The abdomen is soft and nontender.  The bowel sounds are normal.  The liver and spleen are not enlarged.  There are no abdominal masses.  There are no abdominal bruits.  Extremities reveal good pedal pulses.  There is no phlebitis or edema.  There is no cyanosis or clubbing.  Strength is normal and symmetrical in all extremities.  There is no lateralizing weakness.  There are no sensory deficits.  The skin is warm and dry.  There is no rash.     Assessment / Plan: Continue  same medication.  Work harder on weight loss and exercise.   Return in 4 months for followup office visit lipid panel hepatic function panel and basal metabolic panel.

## 2012-12-30 NOTE — Assessment & Plan Note (Signed)
The patient has not been having any chronic cough.  He does have exertional dyspnea which may be from COPD but more likely from lack of conditioning and lack of regular exercise

## 2012-12-30 NOTE — Patient Instructions (Signed)
Will obtain labs today and call you with the results (lp/bmet/hfp/psa)  Work harder on weight loss and walk more  Your physician recommends that you continue on your current medications as directed. Please refer to the Current Medication list given to you today  Your physician wants you to follow-up in: 4 months with fasting labs (lp/bmet/hfp)  You will receive a reminder letter in the mail two months in advance. If you don't receive a letter, please call our office to schedule the follow-up appointment.

## 2012-12-30 NOTE — Assessment & Plan Note (Signed)
Patient has a history of hyperlipidemia.  We are checking lab work today.  To work harder on diet and weight loss.

## 2013-01-01 NOTE — Progress Notes (Signed)
Quick Note:  Please report to patient. The recent labs are stable. Continue same medication and careful diet. PSA is normal. LDL and TG higher. Work harder on [employer] and diet. ______

## 2013-01-02 ENCOUNTER — Telehealth: Payer: Self-pay | Admitting: *Deleted

## 2013-01-02 NOTE — Telephone Encounter (Signed)
Advised patient of lab results  

## 2013-01-02 NOTE — Telephone Encounter (Signed)
Message copied by Burnell Blanks on Mon Jan 02, 2013  3:01 PM ------      Message from: Cassell Clement      Created: Sun Jan 01, 2013  6:48 PM       Please report to patient.  The recent labs are stable. Continue same medication and careful diet. PSA is normal. LDL and TG higher.  Work harder on [employer] and diet. ------

## 2013-01-19 ENCOUNTER — Other Ambulatory Visit: Payer: Self-pay | Admitting: Cardiology

## 2013-01-19 ENCOUNTER — Telehealth: Payer: Self-pay | Admitting: *Deleted

## 2013-01-19 NOTE — Telephone Encounter (Signed)
[phone]  ID # [id number] Ordered Viagra as requested  Order #[id number]  Advised patient ordered and will call when we receive.

## 2013-01-19 NOTE — Telephone Encounter (Signed)
Ordered viagra

## 2013-02-06 ENCOUNTER — Telehealth: Payer: Self-pay | Admitting: Cardiology

## 2013-02-06 NOTE — Telephone Encounter (Signed)
Walk In pt form " Pt Dropped off Sealed Envelope" gave to [facility]

## 2013-02-08 NOTE — Telephone Encounter (Signed)
Patient has picked up medication 

## 2013-03-13 ENCOUNTER — Telehealth: Payer: Self-pay | Admitting: *Deleted

## 2013-03-22 ENCOUNTER — Other Ambulatory Visit: Payer: Self-pay | Admitting: Cardiology

## 2013-03-30 ENCOUNTER — Other Ambulatory Visit: Payer: Self-pay | Admitting: *Deleted

## 2013-03-30 DIAGNOSIS — F419 Anxiety disorder, unspecified: Secondary | ICD-10-CM

## 2013-03-30 MED ORDER — ALPRAZOLAM 1 MG PO TABS: 1.0000 mg | ORAL_TABLET | Freq: Four times a day (QID) | ORAL | Status: DC

## 2013-04-04 ENCOUNTER — Telehealth: Payer: Self-pay | Admitting: *Deleted

## 2013-04-04 NOTE — Telephone Encounter (Signed)
Per Percell Belt, Pharm D reviewed SilverScript for Lovaza cap 1 gm, this med is now generic and if not covered in patient plan he can buy OTC.

## 2013-04-24 ENCOUNTER — Other Ambulatory Visit: Payer: Self-pay | Admitting: Cardiology

## 2013-04-24 NOTE — Telephone Encounter (Signed)
Opened in error

## 2013-04-25 ENCOUNTER — Other Ambulatory Visit: Payer: Medicare Other

## 2013-04-25 ENCOUNTER — Telehealth: Payer: Self-pay

## 2013-04-25 ENCOUNTER — Other Ambulatory Visit: Payer: Self-pay

## 2013-04-25 MED ORDER — THEOPHYLLINE ER 300 MG PO TB12: ORAL_TABLET | ORAL | Status: DC

## 2013-04-25 NOTE — Telephone Encounter (Signed)
Patient called wanting his inhaler refill

## 2013-05-01 DIAGNOSIS — Z961 Presence of intraocular lens: Secondary | ICD-10-CM | POA: Diagnosis not present

## 2013-05-01 DIAGNOSIS — H2640 Unspecified secondary cataract: Secondary | ICD-10-CM | POA: Diagnosis not present

## 2013-05-01 DIAGNOSIS — H5210 Myopia, unspecified eye: Secondary | ICD-10-CM | POA: Diagnosis not present

## 2013-05-01 DIAGNOSIS — H01009 Unspecified blepharitis unspecified eye, unspecified eyelid: Secondary | ICD-10-CM | POA: Diagnosis not present

## 2013-05-02 ENCOUNTER — Ambulatory Visit (INDEPENDENT_AMBULATORY_CARE_PROVIDER_SITE_OTHER): Payer: Medicare Other | Admitting: *Deleted

## 2013-05-02 DIAGNOSIS — I2510 Atherosclerotic heart disease of native coronary artery without angina pectoris: Secondary | ICD-10-CM | POA: Diagnosis not present

## 2013-05-02 DIAGNOSIS — E785 Hyperlipidemia, unspecified: Secondary | ICD-10-CM

## 2013-05-02 LAB — BASIC METABOLIC PANEL
BUN: 12 mg/dL (ref 6–23)
CO2: 26 mEq/L (ref 19–32)
Calcium: 9.1 mg/dL (ref 8.4–10.5)
Chloride: 102 mEq/L (ref 96–112)
Creatinine, Ser: 1.1 mg/dL (ref 0.4–1.5)
GFR: 72.37 mL/min (ref >60.00)
Glucose, Bld: 119 mg/dL — ABNORMAL HIGH (ref 70–99)
Potassium: 3.4 mEq/L — ABNORMAL LOW (ref 3.5–5.1)
Sodium: 135 mEq/L (ref 135–145)

## 2013-05-02 LAB — HEPATIC FUNCTION PANEL
ALT: 37 U/L (ref 0–53)
AST: 28 U/L (ref 0–37)
Albumin: 3.6 g/dL (ref 3.5–5.2)
Alkaline Phosphatase: 53 U/L (ref 39–117)
Bilirubin, Direct: 0.1 mg/dL (ref 0.0–0.3)
Total Bilirubin: 0.9 mg/dL (ref 0.3–1.2)
Total Protein: 6.7 g/dL (ref 6.0–8.3)

## 2013-05-02 LAB — LIPID PANEL
Cholesterol: 196 mg/dL (ref 0–200)
HDL: 31.6 mg/dL — ABNORMAL LOW (ref >39.00)
LDL Cholesterol: 128 mg/dL — ABNORMAL HIGH (ref 0–99)
Total CHOL/HDL Ratio: 6
Triglycerides: 183 mg/dL — ABNORMAL HIGH (ref 0.0–149.0)
VLDL: 36.6 mg/dL (ref 0.0–40.0)

## 2013-06-12 ENCOUNTER — Telehealth: Payer: Self-pay | Admitting: Cardiology

## 2013-06-12 NOTE — Telephone Encounter (Signed)
New Message ° °Pt called for lab results. Please call  °

## 2013-06-12 NOTE — Telephone Encounter (Signed)
Follow up     Pt want labs results

## 2013-06-12 NOTE — Telephone Encounter (Signed)
Labs from March reviewed by  Dr. Mare Ferrari, he did not receive in his basket. Patient not taking his potassium twice a day, advised he needed to take everyday at twice a day since K+3.4. Patient verbalized understanding. Scheduled follow up ov.

## 2013-06-13 NOTE — Telephone Encounter (Signed)
This was filled

## 2013-07-28 ENCOUNTER — Other Ambulatory Visit: Payer: Self-pay | Admitting: *Deleted

## 2013-07-28 DIAGNOSIS — F419 Anxiety disorder, unspecified: Secondary | ICD-10-CM

## 2013-07-28 MED ORDER — ALPRAZOLAM 1 MG PO TABS: 1.0000 mg | ORAL_TABLET | Freq: Four times a day (QID) | ORAL | Status: DC

## 2013-07-31 DIAGNOSIS — L570 Actinic keratosis: Secondary | ICD-10-CM | POA: Diagnosis not present

## 2013-07-31 DIAGNOSIS — D235 Other benign neoplasm of skin of trunk: Secondary | ICD-10-CM | POA: Diagnosis not present

## 2013-08-04 ENCOUNTER — Other Ambulatory Visit: Payer: Medicare Other

## 2013-08-04 ENCOUNTER — Ambulatory Visit (INDEPENDENT_AMBULATORY_CARE_PROVIDER_SITE_OTHER): Payer: Medicare Other | Admitting: Cardiology

## 2013-08-04 ENCOUNTER — Encounter: Payer: Self-pay | Admitting: Cardiology

## 2013-08-04 ENCOUNTER — Encounter (INDEPENDENT_AMBULATORY_CARE_PROVIDER_SITE_OTHER): Payer: Self-pay

## 2013-08-04 VITALS — BP 160/85 | HR 50 | Ht 70.0 in | Wt 226.0 lb

## 2013-08-04 DIAGNOSIS — J449 Chronic obstructive pulmonary disease, unspecified: Secondary | ICD-10-CM | POA: Diagnosis not present

## 2013-08-04 DIAGNOSIS — I2510 Atherosclerotic heart disease of native coronary artery without angina pectoris: Secondary | ICD-10-CM | POA: Diagnosis not present

## 2013-08-04 DIAGNOSIS — E785 Hyperlipidemia, unspecified: Secondary | ICD-10-CM | POA: Diagnosis not present

## 2013-08-04 DIAGNOSIS — F419 Anxiety disorder, unspecified: Secondary | ICD-10-CM

## 2013-08-04 DIAGNOSIS — I259 Chronic ischemic heart disease, unspecified: Secondary | ICD-10-CM

## 2013-08-04 DIAGNOSIS — I119 Hypertensive heart disease without heart failure: Secondary | ICD-10-CM

## 2013-08-04 DIAGNOSIS — F411 Generalized anxiety disorder: Secondary | ICD-10-CM

## 2013-08-04 DIAGNOSIS — R0989 Other specified symptoms and signs involving the circulatory and respiratory systems: Secondary | ICD-10-CM

## 2013-08-04 LAB — BASIC METABOLIC PANEL
BUN: 11 mg/dL (ref 6–23)
CO2: 30 mEq/L (ref 19–32)
Calcium: 9.7 mg/dL (ref 8.4–10.5)
Chloride: 102 mEq/L (ref 96–112)
Creatinine, Ser: 1 mg/dL (ref 0.4–1.5)
GFR: 76.42 mL/min (ref >60.00)
Glucose, Bld: 102 mg/dL — ABNORMAL HIGH (ref 70–99)
Potassium: 4.2 mEq/L (ref 3.5–5.1)
Sodium: 138 mEq/L (ref 135–145)

## 2013-08-04 LAB — LIPID PANEL
Cholesterol: 177 mg/dL (ref 0–200)
HDL: 30.8 mg/dL — ABNORMAL LOW (ref >39.00)
LDL Cholesterol: 118 mg/dL — ABNORMAL HIGH (ref 0–99)
NonHDL: 146.2
Total CHOL/HDL Ratio: 6
Triglycerides: 143 mg/dL (ref 0.0–149.0)
VLDL: 28.6 mg/dL (ref 0.0–40.0)

## 2013-08-04 LAB — HEPATIC FUNCTION PANEL
ALT: 34 U/L (ref 0–53)
AST: 27 U/L (ref 0–37)
Albumin: 3.6 g/dL (ref 3.5–5.2)
Alkaline Phosphatase: 56 U/L (ref 39–117)
Bilirubin, Direct: 0.1 mg/dL (ref 0.0–0.3)
Total Bilirubin: 0.6 mg/dL (ref 0.2–1.2)
Total Protein: 6.6 g/dL (ref 6.0–8.3)

## 2013-08-04 NOTE — Assessment & Plan Note (Signed)
Patient has a past history of borderline hypertension.  Today his blood pressure is slightly elevated.  His weight is up 4 more pounds.  He has not been getting much exercise.  He has been eating some salty food.. he will work harder on diet and avoidance of salt and continue current therapy.  He has had some mild ankle edema.

## 2013-08-04 NOTE — Assessment & Plan Note (Signed)
The patient has not had any recurrent chest pain or angina. 

## 2013-08-04 NOTE — Assessment & Plan Note (Signed)
Patient has hyperlipidemia.  He is intolerant of statin therapy.  He is on ezetimibe and Lovaza.  He is not having any myalgias

## 2013-08-04 NOTE — Patient Instructions (Signed)
Will obtain labs today and call you with the results (lp/bmet/hfp)  Your physician recommends that you continue on your current medications as directed. Please refer to the Current Medication list given to you today.  Your physician wants you to follow-up in: 4 months with fasting labs (lp/bmet/hfp)  You will receive a reminder letter in the mail two months in advance. If you don't receive a letter, please call our office to schedule the follow-up appointment.  Work harder on diet and weight loss  Avoid foods high in salt

## 2013-08-04 NOTE — Assessment & Plan Note (Signed)
No TIA symptoms. 

## 2013-08-04 NOTE — Progress Notes (Signed)
William Elliott Date of Birth:  1940/05/10 [facility] [address] [place], [place]  [address] [phone]        Fax   [phone]   History of Present Illness: This pleasant 73 year old gentleman is seen for a four-month followup office visit. He has a history of ischemic heart disease. He had successful CABG in 1999. He has a history of an asymptomatic left carotid bruit. He has had prior Dopplers showing no significant stenosis. The patient has a history of erectile dysfunction responsive to Viagra. He has a history of essential hypertension and dyslipidemia. His last echocardiogram was in 7322 showing diastolic dysfunction, aortic sclerosis and mild mitral regurgitation. He has a history of COPD and asthma and is on generic Combivent inhaler. The patient has not been getting much exercise. He has continued to gradually gaining weight. His weight is up 4 pounds since last visit. He has a new PCP who is Dr. [name].   Current Outpatient Prescriptions  Medication Sig Dispense Refill  . ALPRAZolam (XANAX) 1 MG tablet Take 1 tablet (1 mg total) by mouth 4 (four) times daily.  120 tablet  1  . amLODipine (NORVASC) 5 MG tablet TAKE 1 TABLET BY MOUTH EVERY DAY  30 tablet  6  . aspirin 81 MG tablet Take 81 mg by mouth daily.      Marland Kitchen b complex vitamins tablet Take 1 tablet by mouth daily.      . Cholecalciferol 5000 UNITS capsule Take 5,000 Units by mouth daily.      . COMBIVENT RESPIMAT 20-100 MCG/ACT AERS respimat TAKE 1 TO 2 PUFFS 4 TIMES A DAY AS NEEDED  24 g  11  . fluticasone (FLONASE) 50 MCG/ACT nasal spray INSTILL 2 SPRAYS INTO EACH NOSTRIL DAILY.  16 g  5  . hydrochlorothiazide (HYDRODIURIL) 25 MG tablet TAKE 1 TABLET BY MOUTH EVERY DAY  30 tablet  6  . losartan (COZAAR) 100 MG tablet TAKE 1 TABLET (100 MG TOTAL) BY MOUTH DAILY.  30 tablet  6  . LOVAZA 1 G capsule TAKE 2 CAPSULES BY MOUTH TWICE DAILY  120 capsule  PRN  . metoprolol succinate (TOPROL-XL) 100  MG 24 hr tablet TAKE 1 TABLET BY MOUTH EVERY DAY  30 tablet  6  . multivitamin (THERAGRAN) per tablet Take 1 tablet by mouth daily.        . nitroGLYCERIN (NITROSTAT) 0.4 MG SL tablet Place 1 tablet (0.4 mg total) under the tongue every 5 (five) minutes as needed for chest pain.  90 tablet  3  . nystatin cream (MYCOSTATIN)       . potassium chloride SA (K-DUR,KLOR-CON) 20 MEQ tablet Take 1 tablet (20 mEq total) by mouth 2 (two) times daily.  180 tablet  3  . sildenafil (VIAGRA) 100 MG tablet Take 100 mg by mouth daily as needed.        . theophylline (THEODUR) 300 MG 12 hr tablet TAKE 1 TABLET BY MOUTH TWICE DAILY  60 tablet  6  . triamcinolone cream (KENALOG) 0.1 %       . vitamin C (ASCORBIC ACID) 500 MG tablet Take 500 mg by mouth daily.      Marland Kitchen ZETIA 10 MG tablet TAKE 1 TABLET BY MOUTH EVERY DAY  30 tablet  6   No current facility-administered medications for this visit.    Allergies  Allergen Reactions  . Fenofibrate     No energy  . Lipitor [employer]  Calcium]   . Niaspan [Niacin]   . Omacor [Omega-3-Acid Ethyl Esters]     rash  . Zocor [Simvastatin]     Patient Active Problem List   Diagnosis Date Noted  . Coronary artery disease     Priority: High  . Carotid bruit     Priority: Medium  . Dyslipidemia     Priority: Medium  . BPH associated with nocturia 03/17/2011  . Hyperlipidemia   . Ischemic heart disease   . COPD (chronic obstructive pulmonary disease)     History  Smoking status  . Former Smoker  . Types: Cigars  . Quit date: 03/02/1997  Smokeless tobacco  . Not on file    History  Alcohol Use No    Family History  Problem Relation Age of Onset  . Heart disease Father   . Heart attack Father   . Hypertension Mother   . Stroke Mother     Review of Systems: Constitutional: no fever chills diaphoresis or fatigue or change in weight.  Head and neck: no hearing loss, no epistaxis, no photophobia or visual disturbance. Respiratory: No cough,  shortness of breath or wheezing. Cardiovascular: No chest pain peripheral edema, palpitations. Gastrointestinal: No abdominal distention, no abdominal pain, no change in bowel habits hematochezia or melena. Genitourinary: No dysuria, no frequency, no urgency, no nocturia. Musculoskeletal:No arthralgias, no back pain, no gait disturbance or myalgias. Neurological: No dizziness, no headaches, no numbness, no seizures, no syncope, no weakness, no tremors. Hematologic: No lymphadenopathy, no easy bruising. Psychiatric: No confusion, no hallucinations, no sleep disturbance.    Physical Exam: Filed Vitals:   08/04/13 1039  BP: 160/85  Pulse: 50   the general appearance reveals a well-developed well-nourished gentleman in no distress.  He is overweight.The head and neck exam reveals pupils equal and reactive.  Extraocular movements are full.  There is no scleral icterus.  The mouth and pharynx are normal.  The neck is supple.  The carotids reveal soft left carotid bruit.  The jugular venous pressure is normal.  The  thyroid is not enlarged.  There is no lymphadenopathy.  The chest is clear to percussion and auscultation.  There are no rales or rhonchi.  Expansion of the chest is symmetrical.  The precordium is quiet.  The first heart sound is normal.  The second heart sound is physiologically split.  There is no murmur gallop rub or click.  There is no abnormal lift or heave.  The abdomen is soft and nontender.  The bowel sounds are normal.  The liver and spleen are not enlarged.  There are no abdominal masses.  There are no abdominal bruits.  Extremities reveal good pedal pulses.  There is no phlebitis or edema.  There is no cyanosis or clubbing.  Strength is normal and symmetrical in all extremities.  There is no lateralizing weakness.  There are no sensory deficits.  The skin is warm and dry.  There is no rash.  EKG shows sinus bradycardia with first degree AV block and PACs.  Otherwise normal EKG and  is unchanged from 03/17/11   Assessment / Plan: 1. ischemic heart disease status post CABG in 1999. 2. essential hypertension without heart disease 3. COPD 4. erectile dysfunction 5. BPH with nocturia 6. Hypercholesterolemia 7. left carotid bruit, asymptomatic  Plan: Continue same medication.  Work harder on weight loss and salt restriction.  Recheck in 4 months for office visit and fasting lab work.  Blood work today pending.

## 2013-08-06 NOTE — Progress Notes (Signed)
Quick Note:  Please report to patient. The recent labs are stable. Continue same medication and careful diet. ______ 

## 2013-08-15 ENCOUNTER — Other Ambulatory Visit: Payer: Self-pay | Admitting: Cardiology

## 2013-09-21 ENCOUNTER — Other Ambulatory Visit: Payer: Self-pay

## 2013-09-21 DIAGNOSIS — F419 Anxiety disorder, unspecified: Secondary | ICD-10-CM

## 2013-09-21 MED ORDER — ALPRAZOLAM 1 MG PO TABS: 1.0000 mg | ORAL_TABLET | Freq: Four times a day (QID) | ORAL | Status: DC

## 2013-09-28 ENCOUNTER — Other Ambulatory Visit: Payer: Self-pay | Admitting: *Deleted

## 2013-09-28 ENCOUNTER — Telehealth: Payer: Self-pay | Admitting: *Deleted

## 2013-09-28 DIAGNOSIS — F419 Anxiety disorder, unspecified: Secondary | ICD-10-CM

## 2013-09-28 MED ORDER — ALPRAZOLAM 1 MG PO TABS: 1.0000 mg | ORAL_TABLET | Freq: Four times a day (QID) | ORAL | Status: DC

## 2013-09-28 NOTE — Telephone Encounter (Signed)
Okay to refill the alprazolam now and for 2 additional times

## 2013-09-28 NOTE — Telephone Encounter (Signed)
Ok to refill alprazolam for patient? If ok how many refills? It looks like it was called in last week, but the pharmacy stated that they did not receive it?? Please advise. Thanks, MI

## 2013-10-03 ENCOUNTER — Other Ambulatory Visit: Payer: Self-pay | Admitting: *Deleted

## 2013-10-03 DIAGNOSIS — F419 Anxiety disorder, unspecified: Secondary | ICD-10-CM

## 2013-10-04 MED ORDER — ALPRAZOLAM 1 MG PO TABS
1.0000 mg | ORAL_TABLET | Freq: Four times a day (QID) | ORAL | Status: DC
Start: ? — End: 2014-06-06

## 2013-10-16 ENCOUNTER — Other Ambulatory Visit: Payer: Self-pay | Admitting: Cardiology

## 2013-10-16 NOTE — Telephone Encounter (Signed)
Is this ok to refill? Please advise. Thanks, MI

## 2013-11-13 ENCOUNTER — Other Ambulatory Visit: Payer: Self-pay | Admitting: Cardiology

## 2013-12-13 ENCOUNTER — Other Ambulatory Visit: Payer: Self-pay | Admitting: Cardiology

## 2013-12-25 ENCOUNTER — Ambulatory Visit (INDEPENDENT_AMBULATORY_CARE_PROVIDER_SITE_OTHER): Payer: Medicare Other | Admitting: Cardiology

## 2013-12-25 ENCOUNTER — Encounter: Payer: Self-pay | Admitting: Cardiology

## 2013-12-25 VITALS — BP 136/72 | HR 60 | Ht 70.0 in | Wt 220.0 lb

## 2013-12-25 DIAGNOSIS — J438 Other emphysema: Secondary | ICD-10-CM

## 2013-12-25 DIAGNOSIS — I259 Chronic ischemic heart disease, unspecified: Secondary | ICD-10-CM

## 2013-12-25 DIAGNOSIS — I2510 Atherosclerotic heart disease of native coronary artery without angina pectoris: Secondary | ICD-10-CM | POA: Diagnosis not present

## 2013-12-25 DIAGNOSIS — E785 Hyperlipidemia, unspecified: Secondary | ICD-10-CM

## 2013-12-25 DIAGNOSIS — I119 Hypertensive heart disease without heart failure: Secondary | ICD-10-CM

## 2013-12-25 DIAGNOSIS — I2583 Coronary atherosclerosis due to lipid rich plaque: Principal | ICD-10-CM

## 2013-12-25 LAB — BASIC METABOLIC PANEL
BUN: 12 mg/dL (ref 6–23)
CO2: 28 mEq/L (ref 19–32)
Calcium: 10.1 mg/dL (ref 8.4–10.5)
Chloride: 99 mEq/L (ref 96–112)
Creatinine, Ser: 1.2 mg/dL (ref 0.4–1.5)
GFR: 63.28 mL/min (ref >60.00)
Glucose, Bld: 94 mg/dL (ref 70–99)
Potassium: 4.3 mEq/L (ref 3.5–5.1)
Sodium: 136 mEq/L (ref 135–145)

## 2013-12-25 LAB — HEPATIC FUNCTION PANEL
ALT: 37 U/L (ref 0–53)
AST: 31 U/L (ref 0–37)
Albumin: 3.8 g/dL (ref 3.5–5.2)
Alkaline Phosphatase: 61 U/L (ref 39–117)
Bilirubin, Direct: 0.1 mg/dL (ref 0.0–0.3)
Total Bilirubin: 0.7 mg/dL (ref 0.2–1.2)
Total Protein: 7.9 g/dL (ref 6.0–8.3)

## 2013-12-25 LAB — LIPID PANEL
Cholesterol: 191 mg/dL (ref 0–200)
HDL: 31.7 mg/dL — ABNORMAL LOW (ref >39.00)
LDL Cholesterol: 122 mg/dL — ABNORMAL HIGH (ref 0–99)
NonHDL: 159.3
Total CHOL/HDL Ratio: 6
Triglycerides: 185 mg/dL — ABNORMAL HIGH (ref 0.0–149.0)
VLDL: 37 mg/dL (ref 0.0–40.0)

## 2013-12-25 MED ORDER — NITROGLYCERIN 0.4 MG SL SUBL: 0.4000 mg | SUBLINGUAL_TABLET | SUBLINGUAL | Status: DC | PRN

## 2013-12-25 MED ORDER — IPRATROPIUM-ALBUTEROL 20-100 MCG/ACT IN AERS: INHALATION_SPRAY | RESPIRATORY_TRACT | Status: DC

## 2013-12-25 NOTE — Assessment & Plan Note (Signed)
Patient has a history of bronchospasm and asthma.  We refilled his Combivent inhaler for him today.

## 2013-12-25 NOTE — Progress Notes (Signed)
William Elliott Date of Birth:  11-Feb-1941 [facility] [address] [place], [place]  [address] [phone]        Fax   [phone]   History of Present Illness: This pleasant 73 year old gentleman is seen for a four-month followup office visit. He has a history of ischemic heart disease. He had successful CABG in 1999. He has a history of an asymptomatic left carotid bruit. He has had prior Dopplers showing no significant stenosis. The patient has a history of erectile dysfunction responsive to Viagra. He has a history of essential hypertension and dyslipidemia. His last echocardiogram was in 2355 showing diastolic dysfunction, aortic sclerosis and mild mitral regurgitation. He has a history of COPD and asthma and is on generic Combivent inhaler. The patient has not been getting much exercise.  He intends to try to do more walking now that the weather is cooler.  His weight is down 6 pounds since last visit. He has a new PCP who is Dr. [name].   Current Outpatient Prescriptions  Medication Sig Dispense Refill  . ALPRAZolam (XANAX) 1 MG tablet Take 1 tablet (1 mg total) by mouth 4 (four) times daily.  120 tablet  2  . amLODipine (NORVASC) 5 MG tablet TAKE 1 TABLET BY MOUTH EVERY DAY  30 tablet  10  . aspirin 81 MG tablet Take 81 mg by mouth daily.      Marland Kitchen b complex vitamins tablet Take 1 tablet by mouth daily.      . Cholecalciferol 5000 UNITS capsule Take 5,000 Units by mouth daily.      . fluticasone (FLONASE) 50 MCG/ACT nasal spray INSTILL 2 SPRAYS INTO EACH NOSTRIL DAILY.  16 g  5  . hydrochlorothiazide (HYDRODIURIL) 25 MG tablet TAKE 1 TABLET BY MOUTH EVERY DAY.  30 tablet  10  . Ipratropium-Albuterol (COMBIVENT RESPIMAT) 20-100 MCG/ACT AERS respimat TAKE 1 TO 2 PUFFS 4 TIMES A DAY AS NEEDED  24 g  11  . losartan (COZAAR) 100 MG tablet TAKE 1 TABLET BY MOUTH EVERY DAY  30 tablet  10  . LOVAZA 1 G capsule TAKE 2 CAPSULES BY MOUTH TWICE DAILY  120 capsule   PRN  . metoprolol succinate (TOPROL-XL) 100 MG 24 hr tablet TAKE 1 TABLET BY MOUTH EVERY DAY.  30 tablet  10  . multivitamin (THERAGRAN) per tablet Take 1 tablet by mouth daily.        . nitroGLYCERIN (NITROSTAT) 0.4 MG SL tablet Place 1 tablet (0.4 mg total) under the tongue every 5 (five) minutes as needed for chest pain.  90 tablet  3  . nystatin cream (MYCOSTATIN)       . potassium chloride SA (K-DUR,KLOR-CON) 20 MEQ tablet TAKE 1 TABLET BY MOUTH TWICE DAILY  180 tablet  3  . sildenafil (VIAGRA) 100 MG tablet Take 100 mg by mouth daily as needed.        . theophylline (THEODUR) 300 MG 12 hr tablet TAKE 1 TABLET BY MOUTH TWICE DAILY  60 tablet  6  . vitamin C (ASCORBIC ACID) 500 MG tablet Take 500 mg by mouth daily.      Marland Kitchen ZETIA 10 MG tablet TAKE 1 TABLET BY MOUTH EVERY DAY  30 tablet  10  . triamcinolone cream (KENALOG) 0.1 %        No current facility-administered medications for this visit.    Allergies  Allergen Reactions  . Fenofibrate     No energy  .  Lipitor [Atorvastatin Calcium]   . Niaspan [Niacin]   . Omacor [Omega-3-Acid Ethyl Esters]     rash  . Zocor [Simvastatin]     Patient Active Problem List   Diagnosis Date Noted  . Coronary artery disease     Priority: High  . Carotid bruit     Priority: Medium  . Dyslipidemia     Priority: Medium  . Benign hypertensive heart disease without heart failure 08/04/2013  . BPH associated with nocturia 03/17/2011  . Hyperlipidemia   . Ischemic heart disease   . COPD (chronic obstructive pulmonary disease)     History  Smoking status  . Former Smoker  . Types: Cigars  . Quit date: 03/02/1997  Smokeless tobacco  . Not on file    History  Alcohol Use No    Family History  Problem Relation Age of Onset  . Heart disease Father   . Heart attack Father   . Hypertension Mother   . Stroke Mother     Review of Systems: Constitutional: no fever chills diaphoresis or fatigue or change in weight.  Head and neck: no  hearing loss, no epistaxis, no photophobia or visual disturbance. Respiratory: No cough, shortness of breath or wheezing. Cardiovascular: No chest pain peripheral edema, palpitations. Gastrointestinal: No abdominal distention, no abdominal pain, no change in bowel habits hematochezia or melena. Genitourinary: No dysuria, no frequency, no urgency, no nocturia. Musculoskeletal:No arthralgias, no back pain, no gait disturbance or myalgias. Neurological: No dizziness, no headaches, no numbness, no seizures, no syncope, no weakness, no tremors. Hematologic: No lymphadenopathy, no easy bruising. Psychiatric: No confusion, no hallucinations, no sleep disturbance.    Physical Exam: Filed Vitals:   12/25/13 1353  BP: 136/72  Pulse: 60   the general appearance reveals a well-developed well-nourished gentleman in no distress.  He is overweight.The head and neck exam reveals pupils equal and reactive.  Extraocular movements are full.  There is no scleral icterus.  The mouth and pharynx are normal.  The neck is supple.  The carotids reveal soft left carotid bruit.  The jugular venous pressure is normal.  The  thyroid is not enlarged.  There is no lymphadenopathy.  The chest is clear to percussion and auscultation.  There are no rales or rhonchi.  Expansion of the chest is symmetrical.  The precordium is quiet.  The first heart sound is normal.  The second heart sound is physiologically split.  There is no murmur gallop rub or click.  There is no abnormal lift or heave.  The abdomen is soft and nontender.  The bowel sounds are normal.  The liver and spleen are not enlarged.  There are no abdominal masses.  There are no abdominal bruits.  Extremities reveal good pedal pulses.  There is no phlebitis or edema.  There is no cyanosis or clubbing.  Strength is normal and symmetrical in all extremities.  There is no lateralizing weakness.  There are no sensory deficits.  The skin is warm and dry.  There is no  rash.  EKG shows sinus bradycardia with first degree AV block and PACs.  Otherwise normal EKG and is unchanged from 03/17/11   Assessment / Plan: 1. ischemic heart disease status post CABG in 1999. 2. essential hypertension without heart disease 3. COPD 4. erectile dysfunction 5. BPH with nocturia 6. Hypercholesterolemia 7. left carotid bruit, asymptomatic  Plan: Continue same medication.  Work hard on weight loss and salt restriction.  Recheck in 4 months for office visit  and fasting lab work.  Blood work today pending.

## 2013-12-25 NOTE — Assessment & Plan Note (Signed)
Blood pressure is stable.  No dizziness or syncope.  No recent chest discomfort.

## 2013-12-25 NOTE — Patient Instructions (Addendum)
Will obtain labs today and call you with the results (lp/bmet/hfp)  Your physician recommends that you continue on your current medications as directed. Please refer to the Current Medication list given to you today.  Your physician recommends that you schedule a follow-up appointment in: 4 months with fasting labs (lp/bmet/hfp)  

## 2013-12-25 NOTE — Assessment & Plan Note (Signed)
No recurrent angina

## 2013-12-26 NOTE — Progress Notes (Signed)
Quick Note:  Please report to patient. The recent labs are stable. Continue same medication and careful diet. The LDL cholesterol is still too high. Continue with Lovasa and Zetia. Work harder on weight onset and regular exercise and careful diet. ______

## 2013-12-27 ENCOUNTER — Other Ambulatory Visit: Payer: Self-pay | Admitting: Cardiology

## 2013-12-27 ENCOUNTER — Other Ambulatory Visit: Payer: Self-pay

## 2013-12-27 MED ORDER — ALBUTEROL SULFATE HFA 108 (90 BASE) MCG/ACT IN AERS: 1.0000 | INHALATION_SPRAY | Freq: Four times a day (QID) | RESPIRATORY_TRACT | Status: DC | PRN

## 2013-12-27 NOTE — Telephone Encounter (Signed)
Okay to switch to proair  1-2 puff q6h prn dyspnea.

## 2014-02-13 ENCOUNTER — Encounter: Payer: Self-pay | Admitting: Cardiology

## 2014-03-16 ENCOUNTER — Other Ambulatory Visit: Payer: Self-pay | Admitting: Cardiology

## 2014-03-16 DIAGNOSIS — R0600 Dyspnea, unspecified: Secondary | ICD-10-CM

## 2014-03-16 NOTE — Telephone Encounter (Signed)
03-16-14 pt called 429 pm to say he is allergic to the Proair, itching and throat "stopped up", would like something comparable to Combivent, uses [facility], pls call [phone]

## 2014-03-16 NOTE — Telephone Encounter (Signed)
Spoke with patient and advised  Dr. Mare Ferrari not back in the office until Tuesday Patient stated he felt more convertible with  Dr. Mare Ferrari prescribing all of his medications  Will forward for review

## 2014-04-12 ENCOUNTER — Other Ambulatory Visit: Payer: Self-pay | Admitting: Cardiology

## 2014-04-12 NOTE — Telephone Encounter (Signed)
OK to refill Combivent

## 2014-04-13 NOTE — Telephone Encounter (Signed)
Okay to refill? 

## 2014-04-16 NOTE — Telephone Encounter (Signed)
Okay to refill combivent.

## 2014-04-20 ENCOUNTER — Ambulatory Visit (INDEPENDENT_AMBULATORY_CARE_PROVIDER_SITE_OTHER): Payer: Medicare Other | Admitting: Cardiology

## 2014-04-20 ENCOUNTER — Other Ambulatory Visit (INDEPENDENT_AMBULATORY_CARE_PROVIDER_SITE_OTHER): Payer: Medicare Other | Admitting: *Deleted

## 2014-04-20 ENCOUNTER — Encounter: Payer: Self-pay | Admitting: Cardiology

## 2014-04-20 VITALS — BP 134/70 | HR 56 | Ht 70.0 in | Wt 220.8 lb

## 2014-04-20 DIAGNOSIS — I119 Hypertensive heart disease without heart failure: Secondary | ICD-10-CM | POA: Diagnosis not present

## 2014-04-20 DIAGNOSIS — E785 Hyperlipidemia, unspecified: Secondary | ICD-10-CM | POA: Diagnosis not present

## 2014-04-20 LAB — BASIC METABOLIC PANEL
BUN: 11 mg/dL (ref 6–23)
CO2: 30 mEq/L (ref 19–32)
Calcium: 9.7 mg/dL (ref 8.4–10.5)
Chloride: 100 mEq/L (ref 96–112)
Creatinine, Ser: 1.07 mg/dL (ref 0.40–1.50)
GFR: 72.17 mL/min (ref >60.00)
Glucose, Bld: 113 mg/dL — ABNORMAL HIGH (ref 70–99)
Potassium: 3.6 mEq/L (ref 3.5–5.1)
Sodium: 138 mEq/L (ref 135–145)

## 2014-04-20 LAB — HEPATIC FUNCTION PANEL
ALT: 34 U/L (ref 0–53)
AST: 26 U/L (ref 0–37)
Albumin: 4.3 g/dL (ref 3.5–5.2)
Alkaline Phosphatase: 63 U/L (ref 39–117)
Bilirubin, Direct: 0.1 mg/dL (ref 0.0–0.3)
Total Bilirubin: 0.7 mg/dL (ref 0.2–1.2)
Total Protein: 7 g/dL (ref 6.0–8.3)

## 2014-04-20 LAB — LIPID PANEL
Cholesterol: 175 mg/dL (ref 0–200)
HDL: 34.6 mg/dL — ABNORMAL LOW (ref >39.00)
NonHDL: 140.4
Total CHOL/HDL Ratio: 5
Triglycerides: 228 mg/dL — ABNORMAL HIGH (ref 0.0–149.0)
VLDL: 45.6 mg/dL — ABNORMAL HIGH (ref 0.0–40.0)

## 2014-04-20 LAB — LDL CHOLESTEROL, DIRECT: Direct LDL: 117 mg/dL

## 2014-04-20 NOTE — Progress Notes (Signed)
Cardiology Office Note   Date:  04/20/2014   ID:  William Elliott, DOB 25-Jan-1941, MRN [id number]  PCP:  No primary care provider on file.  Cardiologist:   William Coco, MD   No chief complaint on file.     History of Present Illness: William Elliott is a 74 y.o. male who presents for a four-month follow-up office visit. This pleasant 74 year old gentleman is seen for a four-month followup office visit. He has a history of ischemic heart disease. He had successful CABG in 1999. He has a history of an asymptomatic left carotid bruit. He has had prior Dopplers showing no significant stenosis. The patient has a history of erectile dysfunction responsive to Viagra. He has a history of essential hypertension and dyslipidemia. His last echocardiogram was in 5956 showing diastolic dysfunction, aortic sclerosis and mild mitral regurgitation. He has a history of COPD and asthma and is on generic Combivent inhaler. The patient has not been getting much exercise. He intends to try to do more walking now that the weather is cooler. His weight is unchanged since last visit. He has a new PCP who is Dr. Wende Elliott.   Past Medical History  Diagnosis Date  . Coronary artery disease     CABG - 1999  . Hyperlipidemia   . Carotid bruit     Asymptomatic left carotid bruit  . Ischemic heart disease   . Erectile dysfunction   . Dyslipidemia   . COPD (chronic obstructive pulmonary disease)   . History of Doppler ultrasound 04/13/2007    1. 20-39% stenosis noted in bilateral internal carotid arteries. 1. 20-39% stenosis noted in bilateral internal carotid arteries. distal common carotid artery.  3. Antegrade bilateral vertebral arteries.    Past Surgical History  Procedure Laterality Date  . Coronary artery bypass graft  1999  . Leg surgery  1995     left leg [facility]  . Cardiac catheterization       Current Outpatient Prescriptions  Medication Sig Dispense Refill  . ALPRAZolam  (XANAX) 1 MG tablet Take 1 tablet (1 mg total) by mouth 4 (four) times daily. 120 tablet 2  . amLODipine (NORVASC) 5 MG tablet TAKE 1 TABLET BY MOUTH EVERY DAY 30 tablet 10  . aspirin 81 MG tablet Take 81 mg by mouth daily.    Marland Kitchen b complex vitamins tablet Take 1 tablet by mouth daily.    . Cholecalciferol 5000 UNITS capsule Take 5,000 Units by mouth daily.    . COMBIVENT RESPIMAT 20-100 MCG/ACT AERS respimat INHALE 1-2 PUFFS FOUR TIMES DAILY AS NEEDED 4 g 11  . fluticasone (FLONASE) 50 MCG/ACT nasal spray INSTILL 2 SPRAYS INTO EACH NOSTRIL DAILY. 16 g 5  . hydrochlorothiazide (HYDRODIURIL) 25 MG tablet TAKE 1 TABLET BY MOUTH EVERY DAY. 30 tablet 10  . Ipratropium-Albuterol (COMBIVENT RESPIMAT) 20-100 MCG/ACT AERS respimat TAKE 1 TO 2 PUFFS 4 TIMES A DAY AS NEEDED 24 g 11  . losartan (COZAAR) 100 MG tablet TAKE 1 TABLET BY MOUTH EVERY DAY 30 tablet 10  . metoprolol succinate (TOPROL-XL) 100 MG 24 hr tablet TAKE 1 TABLET BY MOUTH EVERY DAY. 30 tablet 10  . multivitamin (THERAGRAN) per tablet Take 1 tablet by mouth daily.      . nitroGLYCERIN (NITROSTAT) 0.4 MG SL tablet Place 1 tablet (0.4 mg total) under the tongue every 5 (five) minutes as needed for chest pain. 90 tablet 3  . Omega 3 1000 MG CAPS Take by mouth  daily.    . potassium chloride SA (K-DUR,KLOR-CON) 20 MEQ tablet TAKE 1 TABLET BY MOUTH TWICE DAILY 180 tablet 3  . PROAIR HFA 108 (90 BASE) MCG/ACT inhaler INHALE 1-2 PUFFS EVERY 6 HOURS AS NEEDED FOR DYSPNEA. 8.5 g 2  . sildenafil (VIAGRA) 100 MG tablet Take 100 mg by mouth daily as needed.      . theophylline (THEODUR) 300 MG 12 hr tablet TAKE 1 TABLET BY MOUTH TWICE DAILY 60 tablet 6  . triamcinolone cream (KENALOG) 0.1 % Apply 1 application topically as needed.     . vitamin C (ASCORBIC ACID) 500 MG tablet Take 500 mg by mouth daily.    Marland Kitchen ZETIA 10 MG tablet TAKE 1 TABLET BY MOUTH EVERY DAY 30 tablet 10   No current facility-administered medications for this visit.    Allergies:    Fenofibrate; Lipitor; Niaspan; Omacor; and Zocor    Social History:  The patient  reports that he quit smoking about 17 years ago. His smoking use included Cigars. He does not have any smokeless tobacco history on file. He reports that he does not drink alcohol or use illicit drugs.   Family History:  The patient's family history includes Heart attack in his father; Heart disease in his father; Hypertension in his mother; Stroke in his mother.    ROS:  Please see the history of present illness.   Otherwise, review of systems are positive for none.   All other systems are reviewed and negative.    PHYSICAL EXAM: VS:  BP 134/70 mmHg  Pulse 56  Ht 5\' 10"  (1.778 m)  Wt 220 lb 12.8 oz (100.154 kg)  BMI 31.68 kg/m2 , BMI Body mass index is 31.68 kg/(m^2). GEN: Well nourished, well developed, in no acute distress HEENT: normal Neck: no JVD, there is a soft left carotid bruit,  Cardiac: RRR; no murmurs, rubs, or gallops,no edema  Respiratory:  clear to auscultation bilaterally, normal work of breathing GI: soft, nontender, nondistended, + BS MS: no deformity or atrophy Skin: warm and dry, no rash Neuro:  Strength and sensation are intact Psych: euthymic mood, full affect   EKG:  EKG is not ordered today.    Recent Labs: 12/25/2013: ALT 37; BUN 12; Creatinine 1.2; Potassium 4.3; Sodium 136    Lipid Panel    Component Value Date/Time   CHOL 191 12/25/2013 1528   TRIG 185.0* 12/25/2013 1528   HDL 31.70* 12/25/2013 1528   CHOLHDL 6 12/25/2013 1528   VLDL 37.0 12/25/2013 1528   LDLCALC 122* 12/25/2013 1528   LDLDIRECT 142.8 08/19/2012 1254      Wt Readings from Last 3 Encounters:  04/20/14 220 lb 12.8 oz (100.154 kg)  12/25/13 220 lb (99.791 kg)  08/04/13 226 lb (102.513 kg)         ASSESSMENT AND PLAN:  1. ischemic heart disease status post CABG in 1999. 2. essential hypertension without heart disease 3. COPD 4. erectile dysfunction 5. BPH with nocturia 6.  Hypercholesterolemia 7. left carotid bruit, asymptomatic  Disposition: We are renewing his albuterol rescue inhaler. Current medicines are reviewed at length with the patient today.  The patient does not have concerns regarding medicines.  The following changes have been made:  no change  Labs/ tests ordered today include: Lipid panel hepatic function panel and basal metabolic panel   Orders Placed This Encounter  Procedures  . Lipid panel  . Hepatic function panel  . Basic metabolic panel     Disposition:  FU with William Elliott in 4 months for follow-up office visit and fasting lab work.   Signed, William Coco, MD  04/20/2014 4:32 PM    [facility] [place], [place], [place]  [address] Phone: [phone]; Fax: [phone]

## 2014-04-20 NOTE — Addendum Note (Signed)
Addended by: Eulis Foster on: 04/20/2014 01:03 PM   Modules accepted: Orders

## 2014-04-20 NOTE — Patient Instructions (Addendum)
Your physician recommends that you continue on your current medications as directed. Please refer to the Current Medication list given to you today.  Your physician recommends that you schedule a follow-up appointment in: 4 months with fasting labs (lp/bmet/hfp)  

## 2014-04-24 ENCOUNTER — Telehealth: Payer: Self-pay | Admitting: Cardiology

## 2014-04-24 MED ORDER — ALBUTEROL SULFATE HFA 108 (90 BASE) MCG/ACT IN AERS: INHALATION_SPRAY | RESPIRATORY_TRACT | Status: DC

## 2014-04-24 NOTE — Telephone Encounter (Signed)
Advised patient of lab results  Rescue inhaler called in as requested. Patient wanted rescue Combivent but no longer manufactor what he requested.  Discussed with  Dr. Mare Ferrari and will Rx Albuterol 1-2 puffs as needed  Patient aware

## 2014-04-24 NOTE — Telephone Encounter (Signed)
-----   Message from Darlin Coco, MD sent at 04/22/2014  7:58 PM EST ----- Please report.  LDL is still high. TG are high.  BS mildly elevated. Work harder on weight loss and diet.

## 2014-04-24 NOTE — Telephone Encounter (Signed)
New Message  Pt was told to call and speak w/ Rn. Please call back and discuss.

## 2014-05-11 DIAGNOSIS — H26491 Other secondary cataract, right eye: Secondary | ICD-10-CM | POA: Diagnosis not present

## 2014-05-11 DIAGNOSIS — H02051 Trichiasis without entropian right upper eyelid: Secondary | ICD-10-CM | POA: Diagnosis not present

## 2014-05-11 DIAGNOSIS — Z961 Presence of intraocular lens: Secondary | ICD-10-CM | POA: Diagnosis not present

## 2014-05-11 DIAGNOSIS — H01001 Unspecified blepharitis right upper eyelid: Secondary | ICD-10-CM | POA: Diagnosis not present

## 2014-05-12 ENCOUNTER — Other Ambulatory Visit: Payer: Self-pay | Admitting: Cardiology

## 2014-05-14 ENCOUNTER — Telehealth: Payer: Self-pay

## 2014-05-15 ENCOUNTER — Other Ambulatory Visit: Payer: Self-pay | Admitting: Cardiology

## 2014-05-15 ENCOUNTER — Telehealth: Payer: Self-pay | Admitting: *Deleted

## 2014-05-15 MED ORDER — THEOPHYLLINE ER 300 MG PO TB12: 300.0000 mg | ORAL_TABLET | Freq: Two times a day (BID) | ORAL | Status: DC

## 2014-05-15 NOTE — Telephone Encounter (Signed)
Sent to [facility]

## 2014-05-15 NOTE — Telephone Encounter (Signed)
Spoke with patient and he is needing refill on Theophylline, will forward to  Dr. Mare Ferrari for approval

## 2014-05-15 NOTE — Telephone Encounter (Signed)
OK to refill William Elliott

## 2014-05-15 NOTE — Telephone Encounter (Signed)
Refilled again as requested

## 2014-05-16 ENCOUNTER — Encounter: Payer: Self-pay | Admitting: Cardiology

## 2014-05-16 NOTE — Telephone Encounter (Signed)
This encounter was created in error - please disregard.

## 2014-05-16 NOTE — Telephone Encounter (Signed)
New message  ° ° °Returning call back to nurse.  °

## 2014-05-21 ENCOUNTER — Telehealth: Payer: Self-pay | Admitting: Cardiology

## 2014-05-21 NOTE — Telephone Encounter (Signed)
Left message to call back  

## 2014-05-21 NOTE — Telephone Encounter (Signed)
Walk In pt Form " [employer]" papers Dropped off gave to [facility]

## 2014-05-21 NOTE — Telephone Encounter (Signed)
New Msg       Pt c/o medication issue:  1. Name of Medication: Combivent Respimat  2. How are you currently taking this medication (dosage and times per day)? Inhaler as needed  3. Are you having a reaction (difficulty breathing--STAT)? No   4. What is your medication issue? Pt states pharmacy is trying to stop him from getting medication?   Pt states he needs medication very badly.  Please return pt after 3pm today.

## 2014-05-24 ENCOUNTER — Telehealth: Payer: Self-pay

## 2014-05-24 DIAGNOSIS — J449 Chronic obstructive pulmonary disease, unspecified: Secondary | ICD-10-CM

## 2014-05-24 NOTE — Telephone Encounter (Signed)
Discussed with  Dr. Mare Ferrari and will refer to pulmonologist

## 2014-05-24 NOTE — Telephone Encounter (Signed)
Patient call stating that they are going to d/c theodur 300 mg and will need something else to replace it . I found him a 30 day supply at [facility] drug store, not sure if the will have additional refills

## 2014-05-25 NOTE — Telephone Encounter (Signed)
Left message to call back  

## 2014-05-25 NOTE — Telephone Encounter (Signed)
Dr. Mare Ferrari would like to refer to Pulmonologist  Left message to call back

## 2014-05-28 NOTE — Telephone Encounter (Signed)
Spoke with patient and put referral in for pulmonary consult

## 2014-05-30 NOTE — Telephone Encounter (Signed)
Patient has been scheduled for pulmonary consults

## 2014-05-30 NOTE — Telephone Encounter (Signed)
Patient has been scheduled for pulmonary consult

## 2014-06-06 ENCOUNTER — Other Ambulatory Visit: Payer: Self-pay | Admitting: *Deleted

## 2014-06-06 ENCOUNTER — Other Ambulatory Visit: Payer: Self-pay

## 2014-06-06 DIAGNOSIS — F419 Anxiety disorder, unspecified: Secondary | ICD-10-CM

## 2014-06-06 MED ORDER — LOSARTAN POTASSIUM 100 MG PO TABS: 100.0000 mg | ORAL_TABLET | Freq: Every day | ORAL | Status: DC

## 2014-06-07 MED ORDER — ALPRAZOLAM 1 MG PO TABS: 1.0000 mg | ORAL_TABLET | Freq: Four times a day (QID) | ORAL | Status: DC

## 2014-07-03 ENCOUNTER — Institutional Professional Consult (permissible substitution): Payer: Medicare Other | Admitting: Internal Medicine

## 2014-07-26 ENCOUNTER — Ambulatory Visit (INDEPENDENT_AMBULATORY_CARE_PROVIDER_SITE_OTHER): Payer: Medicare Other | Admitting: Internal Medicine

## 2014-07-26 ENCOUNTER — Encounter: Payer: Self-pay | Admitting: Internal Medicine

## 2014-07-26 VITALS — BP 120/80 | HR 48 | Ht 70.0 in | Wt 202.0 lb

## 2014-07-26 DIAGNOSIS — R0600 Dyspnea, unspecified: Secondary | ICD-10-CM | POA: Diagnosis not present

## 2014-07-26 DIAGNOSIS — I10 Essential (primary) hypertension: Secondary | ICD-10-CM

## 2014-07-26 MED ORDER — TIOTROPIUM BROMIDE-OLODATEROL 2.5-2.5 MCG/ACT IN AERS: 2.0000 | INHALATION_SPRAY | Freq: Every day | RESPIRATORY_TRACT | Status: DC

## 2014-07-26 MED ORDER — BISOPROLOL FUMARATE 5 MG PO TABS: 5.0000 mg | ORAL_TABLET | Freq: Every day | ORAL | Status: DC

## 2014-07-26 NOTE — Progress Notes (Signed)
Subjective:    Patient ID: William Elliott, male    DOB: [birth year],     MRN: [id number]  HPI   60 yowm quit smoking cigars around 2005 but never inhaled with dx of ? childhood asthma but able to play bball and fball in hs with good activity tolerance until around  2008 when noted doe moved to [facility] with wt gain/ depression rx while living in [place] but moved out in 2012 and referred by Dr Mare Ferrari for copd evaluation 07/26/2014    07/26/2014 [facility] office visit/ Sophi Calligan   Chief Complaint  Patient presents with  . Pulmonary Consult    Referred by Dr. Mare Ferrari for eval of COPD.  Pt states that he will need subsitute for theophylline soon.  He states his COPD is stable on this med, but it will soon no longer be made. He uses ventolin and combivent respimat 3-4 x per day.   uses combivent as maint rx last used it > 6 hours prior to OV  And freq saba also  Doe x > slow walk or if goes up inclines  Note started on theoph > 20 y prior to [place]  Though says breathing problem only started p wt gain in 2008   No obvious day to day or daytime variabilty or assoc chronic cough or cp or chest tightness, subjective wheeze overt sinus or hb symptoms. No unusual exp hx or h/o childhood pna/ asthma or knowledge of premature birth.  Sleeping ok without nocturnal  or early am exacerbation  of respiratory  c/o's or need for noct saba. Also denies any obvious fluctuation of symptoms with weather or environmental changes or other aggravating or alleviating factors except as outlined above   Current Medications, Allergies, Complete Past Medical History, Past Surgical History, Family History, and Social History were reviewed in [employer] record.          Review of Systems  Constitutional: Negative for fever, chills, activity change, appetite change and unexpected weight change.  HENT: Negative for congestion, dental problem, postnasal drip, rhinorrhea,  sneezing, sore throat, trouble swallowing and voice change.   Eyes: Negative for visual disturbance.  Respiratory: Negative for cough, choking and shortness of breath.   Cardiovascular: Negative for chest pain and leg swelling.  Gastrointestinal: Negative for nausea, vomiting and abdominal pain.  Genitourinary: Negative for difficulty urinating.  Musculoskeletal: Negative for arthralgias.  Skin: Negative for rash.  Psychiatric/Behavioral: Negative for behavioral problems and confusion.       Objective:   Physical Exam  amb wm evasive with questions/ contradicts himself consistently on dates of onset of symptoms  Wt Readings from Last 3 Encounters:  07/26/14 202 lb (91.627 kg)  04/20/14 220 lb 12.8 oz (100.154 kg)  12/25/13 220 lb (99.791 kg)    Vital signs reviewed   HEENT: nl dentition, turbinates, and orophanx. Nl external ear canals without cough reflex   NECK :  without JVD/Nodes/TM/ nl carotid upstrokes bilaterally   LUNGS: no acc muscle use, clear to A and P bilaterally without cough on insp or exp maneuvers   CV:  RRR  no s3 or murmur or increase in P2, no edema   ABD:  soft and nontender with pos late insp hoover's in the supine position. No bruits or organomegaly, bowel sounds nl  MS:  warm without deformities, calf tenderness, cyanosis or clubbing  SKIN: warm and dry without lesions    NEURO:  alert, approp, no deficits  No cxr's on file               Assessment & Plan:

## 2014-07-26 NOTE — Patient Instructions (Addendum)
Try off combivent and theophylline    Plan A = automatic = stiolto 2 puffs each am   Plan B= Backup Only use your albuterol as a rescue medication to be used if you can't catch your breath by resting or doing a relaxed purse lip breathing pattern.  - The less you use it, the better it will work when you need it. - Ok to use up to 2 puffs  every 4 hours if you must but call for immediate appointment if use goes up over your usual need - Don't leave home without it !!  (think of it like the spare tire for your car)   Stop toprol  Bisoprolol 5 mg daily   Please schedule a follow up office visit in 6 weeks, call sooner if needed with pfts and cxr

## 2014-07-27 ENCOUNTER — Encounter: Payer: Self-pay | Admitting: Internal Medicine

## 2014-07-27 DIAGNOSIS — I10 Essential (primary) hypertension: Secondary | ICD-10-CM | POA: Insufficient documentation

## 2014-07-27 NOTE — Assessment & Plan Note (Signed)
Changed toprol to bisoprolol 07/26/14 due to ? Asthma assoc theoph dependence  He also demonstrated very poor chronotropic resp to exercise so may becoming more sensitive to BB as gets older and off theophylline may be even more sensitive so low threshold to wean bisoprolol to lower doses as well.

## 2014-07-27 NOTE — Assessment & Plan Note (Signed)
-   07/26/2014  Walked RA x 3 laps @ 185 ft each stopped due to  End of study, no sob but pulse only 56 - 07/26/2014 p extensive coaching respimat effectiveness =    90% - Spirometry 07/26/14  FEV1  1.79 ( 52%) ratio 64 but f/v loop is not completely physiologic - dc'd theoph 07/26/2014    Symptoms are markedly disproportionate to objective findings and not clear this is a lung problem and is clearly not copd  but pt does appear to have difficult airway management issues. DDX of  difficult airways management all start with A and  include Adherence, Ace Inhibitors, Acid Reflux, Active Sinus Disease, Alpha 1 Antitripsin deficiency, Anxiety masquerading as Airways dz,  ABPA,  allergy(esp in young), Aspiration (esp in elderly), Adverse effects of DPI,  Active smokers, plus two Bs  = Bronchiectasis and Beta blocker use..and one C= CHF   Adherence is always the initial "prime suspect" and is a multilayered concern that requires a "trust but verify" approach in every patient - starting with knowing how to use medications, especially inhalers, correctly, keeping up with refills and understanding the fundamental difference between maintenance and prns vs those medications only taken for a very short course and then stopped and not refilled.   ? Acid (or non-acid) GERD > always difficult to exclude as up to 75% of pts in some series report no assoc GI/ Heartburn symptoms> try off theoph first  ? Asthma >if so he has the fixed pattern at this point and likes the respimat device so rec trial of stiolto to see if improves symptoms and reduces saba use  ? BB effect > strongly suspect his intol and perhaps some of his airflow obst is related to high dose BB use > rec trial of bisoprolol 5 mg daily until returns for pfts   Each maintenance medication was reviewed in detail including most importantly the difference between maintenance and as needed and under what circumstances the prns are to be used.  Please see  instructions for details which were reviewed in writing and the patient given a copy.

## 2014-07-31 ENCOUNTER — Other Ambulatory Visit: Payer: Self-pay | Admitting: Cardiology

## 2014-08-09 ENCOUNTER — Encounter: Payer: Self-pay | Admitting: Cardiology

## 2014-08-09 ENCOUNTER — Other Ambulatory Visit (INDEPENDENT_AMBULATORY_CARE_PROVIDER_SITE_OTHER): Payer: Medicare Other | Admitting: *Deleted

## 2014-08-09 ENCOUNTER — Ambulatory Visit (INDEPENDENT_AMBULATORY_CARE_PROVIDER_SITE_OTHER): Payer: Medicare Other | Admitting: Cardiology

## 2014-08-09 VITALS — BP 122/68 | HR 50 | Ht 70.0 in | Wt 205.1 lb

## 2014-08-09 DIAGNOSIS — I2510 Atherosclerotic heart disease of native coronary artery without angina pectoris: Secondary | ICD-10-CM | POA: Diagnosis not present

## 2014-08-09 DIAGNOSIS — I2583 Coronary atherosclerosis due to lipid rich plaque: Secondary | ICD-10-CM

## 2014-08-09 DIAGNOSIS — E785 Hyperlipidemia, unspecified: Secondary | ICD-10-CM

## 2014-08-09 DIAGNOSIS — I119 Hypertensive heart disease without heart failure: Secondary | ICD-10-CM

## 2014-08-09 DIAGNOSIS — J449 Chronic obstructive pulmonary disease, unspecified: Secondary | ICD-10-CM | POA: Diagnosis not present

## 2014-08-09 LAB — BASIC METABOLIC PANEL
BUN: 14 mg/dL (ref 6–23)
CO2: 29 mEq/L (ref 19–32)
Calcium: 9.7 mg/dL (ref 8.4–10.5)
Chloride: 101 mEq/L (ref 96–112)
Creatinine, Ser: 0.95 mg/dL (ref 0.40–1.50)
GFR: 82.72 mL/min (ref >60.00)
Glucose, Bld: 94 mg/dL (ref 70–99)
Potassium: 4 mEq/L (ref 3.5–5.1)
Sodium: 134 mEq/L — ABNORMAL LOW (ref 135–145)

## 2014-08-09 LAB — LIPID PANEL
Cholesterol: 166 mg/dL (ref 0–200)
HDL: 35.1 mg/dL — ABNORMAL LOW (ref >39.00)
LDL Cholesterol: 110 mg/dL — ABNORMAL HIGH (ref 0–99)
NonHDL: 130.9
Total CHOL/HDL Ratio: 5
Triglycerides: 104 mg/dL (ref 0.0–149.0)
VLDL: 20.8 mg/dL (ref 0.0–40.0)

## 2014-08-09 LAB — HEPATIC FUNCTION PANEL
ALT: 25 U/L (ref 0–53)
AST: 23 U/L (ref 0–37)
Albumin: 4.5 g/dL (ref 3.5–5.2)
Alkaline Phosphatase: 59 U/L (ref 39–117)
Bilirubin, Direct: 0.1 mg/dL (ref 0.0–0.3)
Total Bilirubin: 0.9 mg/dL (ref 0.2–1.2)
Total Protein: 7 g/dL (ref 6.0–8.3)

## 2014-08-09 NOTE — Patient Instructions (Signed)
Medication Instructions:  Your physician recommends that you continue on your current medications as directed. Please refer to the Current Medication list given to you today.  Labwork: Lp/bmet/hfp  Testing/Procedures: none  Follow-Up: Your physician wants you to follow-up in: 4 months with fasting labs (lp/bmet/hfp)  You will receive a reminder letter in the mail two months in advance. If you don't receive a letter, please call our office to schedule the follow-up appointment.

## 2014-08-09 NOTE — Progress Notes (Signed)
Cardiology Office Note   Date:  08/09/2014   ID:  ASAHD CAN, DOB September 15, 1940, MRN [id number]  PCP:  Delphina Cahill, MD  Cardiologist: Darlin Coco MD  No chief complaint on file.     History of Present Illness: William Elliott is a 74 y.o. male who presents for a four-month follow-up office visit.  This pleasant 74 year old gentleman is seen for a four-month followup office visit. He has a history of ischemic heart disease. He had successful CABG in 1999. He has a history of an asymptomatic left carotid bruit. He has had prior Dopplers showing no significant stenosis. The patient has a history of erectile dysfunction responsive to Viagra. He has a history of essential hypertension and dyslipidemia. His last echocardiogram was in 9622 showing diastolic dysfunction, aortic sclerosis and mild mitral regurgitation. He has a history of COPD and asthma and is under the care of Dr. Melvyn Novas who recently switched him from metoprolol to bisoprolol.  The patient has not been getting much exercise. His wife has been in the hospital so he has not been getting much exercise. He denies any chest pain or angina.  No peripheral edema.  No symptoms of CHF.  Past Medical History  Diagnosis Date  . Coronary artery disease     CABG - 1999  . Hyperlipidemia   . Carotid bruit     Asymptomatic left carotid bruit  . Ischemic heart disease   . Erectile dysfunction   . Dyslipidemia   . COPD (chronic obstructive pulmonary disease)   . History of Doppler ultrasound 04/13/2007    1. 20-39% stenosis noted in bilateral internal carotid arteries. 1. 20-39% stenosis noted in bilateral internal carotid arteries. distal common carotid artery.  3. Antegrade bilateral vertebral arteries.    Past Surgical History  Procedure Laterality Date  . Coronary artery bypass graft  1999  . Leg surgery  1995     left leg [facility]  . Cardiac catheterization       Current Outpatient Prescriptions  Medication  Sig Dispense Refill  . albuterol (PROVENTIL HFA;VENTOLIN HFA) 108 (90 BASE) MCG/ACT inhaler Inhale 1-2 puffs into the lungs every 6 (six) hours as needed for wheezing or shortness of breath (wheezing or shortness of breath).    . ALPRAZolam (XANAX) 1 MG tablet Take 1 tablet (1 mg total) by mouth 4 (four) times daily. 120 tablet 2  . amLODipine (NORVASC) 5 MG tablet TAKE 1 TABLET BY MOUTH EVERY DAY 30 tablet 10  . aspirin 81 MG tablet Take 81 mg by mouth daily.    Marland Kitchen b complex vitamins tablet Take 1 tablet by mouth daily.    . bisoprolol (ZEBETA) 5 MG tablet Take 1 tablet (5 mg total) by mouth daily. 30 tablet 11  . Cholecalciferol 5000 UNITS capsule Take 5,000 Units by mouth daily.    . fluticasone (FLONASE) 50 MCG/ACT nasal spray INSTILL 2 SPRAYS INTO EACH NOSTRIL DAILY. 16 g 5  . hydrochlorothiazide (HYDRODIURIL) 25 MG tablet TAKE 1 TABLET BY MOUTH EVERY DAY. 30 tablet 10  . losartan (COZAAR) 100 MG tablet Take 1 tablet (100 mg total) by mouth daily. 90 tablet 2  . multivitamin (THERAGRAN) per tablet Take 1 tablet by mouth daily.      . nitroGLYCERIN (NITROSTAT) 0.4 MG SL tablet Place 1 tablet (0.4 mg total) under the tongue every 5 (five) minutes as needed for chest pain. 90 tablet 3  . omega-3 acid ethyl esters (LOVAZA) 1 G capsule  Take 2 g by mouth 2 (two) times daily as needed (for blood thinner).    . potassium chloride SA (K-DUR,KLOR-CON) 20 MEQ tablet TAKE 1 TABLET BY MOUTH TWICE DAILY 180 tablet 0  . sildenafil (VIAGRA) 100 MG tablet Take 100 mg by mouth daily as needed (for erectile dysfunction).     . Tiotropium Bromide-Olodaterol (STIOLTO RESPIMAT) 2.5-2.5 MCG/ACT AERS Inhale 2 puffs into the lungs daily. 1 Inhaler 11  . triamcinolone cream (KENALOG) 0.1 % Apply 1 application topically as needed.     . vitamin C (ASCORBIC ACID) 500 MG tablet Take 500 mg by mouth daily.    Marland Kitchen ZETIA 10 MG tablet TAKE 1 TABLET BY MOUTH EVERY DAY 30 tablet 10   No current facility-administered medications  for this visit.    Allergies:   Fenofibrate; Lipitor; Niaspan; Omacor; and Zocor    Social History:  The patient  reports that he quit smoking about 17 years ago. His smoking use included Cigars. He does not have any smokeless tobacco history on file. He reports that he does not drink alcohol or use illicit drugs.   Family History:  The patient's family history includes Heart attack in his father; Heart disease in his father; Hypertension in his mother; Stroke in his mother.    ROS:  Please see the history of present illness.   Otherwise, review of systems are positive for none.   All other systems are reviewed and negative.    PHYSICAL EXAM: VS:  BP 122/68 mmHg  Pulse 50  Ht 5\' 10"  (1.778 m)  Wt 205 lb 1.9 oz (93.042 kg)  BMI 29.43 kg/m2 , BMI Body mass index is 29.43 kg/(m^2). GEN: Well nourished, well developed, in no acute distress HEENT: normal Neck: no JVD, soft left carotid bruit, or masses Cardiac: RRR; no murmurs, rubs, or gallops,no edema  Respiratory:  clear to auscultation bilaterally, normal work of breathing GI: soft, nontender, nondistended, + BS MS: no deformity or atrophy Skin: warm and dry, no rash Neuro:  Strength and sensation are intact Psych: euthymic mood, full affect   EKG:  EKG is not ordered today.    Recent Labs: 04/20/2014: ALT 34; BUN 11; Creatinine, Ser 1.07; Potassium 3.6; Sodium 138    Lipid Panel    Component Value Date/Time   CHOL 175 04/20/2014 1303   TRIG 228.0* 04/20/2014 1303   HDL 34.60* 04/20/2014 1303   CHOLHDL 5 04/20/2014 1303   VLDL 45.6* 04/20/2014 1303   LDLCALC 122* 12/25/2013 1528   LDLDIRECT 117.0 04/20/2014 1303      Wt Readings from Last 3 Encounters:  08/09/14 205 lb 1.9 oz (93.042 kg)  07/26/14 202 lb (91.627 kg)  04/20/14 220 lb 12.8 oz (100.154 kg)         ASSESSMENT AND PLAN:  1. ischemic heart disease status post CABG in 1999. 2. essential hypertension without heart disease 3. COPD 4. erectile  dysfunction 5. BPH with nocturia 6. Hypercholesterolemia 7. left carotid bruit, asymptomatic  Disposition: Continue current medication.  Lab work today is pending.  Recheck in 4 months for ov and lab work.   Current medicines are reviewed at length with the patient today.  The patient does not have concerns regarding medicines.  The following changes have been made:  no change  Labs/ tests ordered today include:   Orders Placed This Encounter  Procedures  . Lipid panel  . Hepatic function panel  . Basic metabolic panel       Signed,  Darlin Coco MD 08/09/2014 2:35 PM    [facility] [address] [place], [place], [place]  [address] Phone: [phone]; Fax: [phone]

## 2014-08-09 NOTE — Progress Notes (Signed)
Quick Note:  Please report to patient. The recent labs are stable. Continue same medication and careful diet. ______ 

## 2014-08-10 ENCOUNTER — Telehealth: Payer: Self-pay | Admitting: Internal Medicine

## 2014-08-10 NOTE — Telephone Encounter (Signed)
Pharmacy called patient and told patient that they will need a PA for Stiolto.  Called pharmacy to get them to fax PA request.   Patient notified that it will take at least a week or so to process.   Initiated through [place]: [place]

## 2014-08-13 NOTE — Telephone Encounter (Signed)
Will forward to [place] to advise once approval/denial is received.

## 2014-08-13 NOTE — Telephone Encounter (Signed)
PA denied Pt must try and fail Both: Combivent and Anoro. Pt is currently on Combivent.

## 2014-08-14 MED ORDER — UMECLIDINIUM-VILANTEROL 62.5-25 MCG/INH IN AEPB
1.0000 | INHALATION_SPRAY | Freq: Every day | RESPIRATORY_TRACT | Status: DC
Start: 2014-08-14 — End: 2015-03-25

## 2014-08-14 NOTE — Telephone Encounter (Signed)
Ok to start anoro but when starts it needs to stop the combivent and stiolto

## 2014-08-14 NOTE — Telephone Encounter (Signed)
Patient notified. Sent Anoro to pharmacy.  Patient advised to stop the combivent and Stiolto.  Patient says that he is not currently taking Stiolto.  Nothing further needed.

## 2014-08-14 NOTE — Telephone Encounter (Signed)
Please advise MW thanks 

## 2014-08-15 ENCOUNTER — Telehealth: Payer: Self-pay | Admitting: Cardiology

## 2014-08-15 ENCOUNTER — Telehealth: Payer: Self-pay | Admitting: Internal Medicine

## 2014-08-15 NOTE — Telephone Encounter (Signed)
Ronalee Belts from [employer] called. advised him to cancel PA request for Stiolto. Nothing further needed.

## 2014-08-15 NOTE — Telephone Encounter (Signed)
New Message  Pt states that he was sent to the pulmonologist. Pt reports that he was placed on Anoro Ellipt Aer 62.5-25. Pt req a call back to determine if he should take this medication. He says that the medication list that it can cause death if he has asthma. He is very worried to take. Request that this message is only received by melinda.  Please call back to discuss

## 2014-08-15 NOTE — Telephone Encounter (Signed)
Spoke with pt's wife. He was mistaken. Anoro does not need a PA. Nothing further was needed.

## 2014-08-17 NOTE — Telephone Encounter (Signed)
Follow Up   Pt is calling following on call from 2 days ago as listed below. Please call.

## 2014-08-17 NOTE — Telephone Encounter (Signed)
The pt is advised that Dr Mare Ferrari and his nurse,. Rip Harbour, is not in the office today but that Im sending this message to them to address when they return on 6/20. The pt verbalized understanding and is in agreement.

## 2014-08-19 NOTE — Telephone Encounter (Signed)
He should discuss the risks and benefits of this drug with his pulmonologist.

## 2014-08-20 NOTE — Telephone Encounter (Signed)
Advised patient

## 2014-10-29 ENCOUNTER — Other Ambulatory Visit: Payer: Self-pay | Admitting: Cardiology

## 2014-11-29 ENCOUNTER — Other Ambulatory Visit: Payer: Self-pay | Admitting: Cardiology

## 2014-11-29 DIAGNOSIS — J3089 Other allergic rhinitis: Secondary | ICD-10-CM

## 2014-11-29 DIAGNOSIS — F419 Anxiety disorder, unspecified: Secondary | ICD-10-CM

## 2014-11-29 NOTE — Telephone Encounter (Signed)
Okay to refill? 

## 2014-11-30 MED ORDER — ALPRAZOLAM 1 MG PO TABS: 1.0000 mg | ORAL_TABLET | Freq: Four times a day (QID) | ORAL | Status: DC

## 2014-12-01 ENCOUNTER — Other Ambulatory Visit: Payer: Self-pay | Admitting: Cardiology

## 2014-12-03 NOTE — Telephone Encounter (Signed)
OK to refill

## 2014-12-14 ENCOUNTER — Encounter: Payer: Self-pay | Admitting: Cardiology

## 2014-12-14 ENCOUNTER — Ambulatory Visit (INDEPENDENT_AMBULATORY_CARE_PROVIDER_SITE_OTHER): Payer: Medicare Other | Admitting: Cardiology

## 2014-12-14 VITALS — BP 136/74 | HR 47 | Ht 70.0 in | Wt 211.0 lb

## 2014-12-14 DIAGNOSIS — J449 Chronic obstructive pulmonary disease, unspecified: Secondary | ICD-10-CM | POA: Diagnosis not present

## 2014-12-14 DIAGNOSIS — I2583 Coronary atherosclerosis due to lipid rich plaque: Principal | ICD-10-CM

## 2014-12-14 DIAGNOSIS — I2510 Atherosclerotic heart disease of native coronary artery without angina pectoris: Secondary | ICD-10-CM

## 2014-12-14 DIAGNOSIS — I119 Hypertensive heart disease without heart failure: Secondary | ICD-10-CM

## 2014-12-14 DIAGNOSIS — E785 Hyperlipidemia, unspecified: Secondary | ICD-10-CM | POA: Diagnosis not present

## 2014-12-14 LAB — HEPATIC FUNCTION PANEL
ALT: 29 U/L (ref 9–46)
AST: 27 U/L (ref 10–35)
Albumin: 4.4 g/dL (ref 3.6–5.1)
Alkaline Phosphatase: 56 U/L (ref 40–115)
Bilirubin, Direct: 0.1 mg/dL (ref <=0.2)
Indirect Bilirubin: 0.6 mg/dL (ref 0.2–1.2)
Total Bilirubin: 0.7 mg/dL (ref 0.2–1.2)
Total Protein: 7.1 g/dL (ref 6.1–8.1)

## 2014-12-14 LAB — BASIC METABOLIC PANEL
BUN: 15 mg/dL (ref 7–25)
CO2: 28 mmol/L (ref 20–31)
Calcium: 9.8 mg/dL (ref 8.6–10.3)
Chloride: 100 mmol/L (ref 98–110)
Creat: 1.05 mg/dL (ref 0.70–1.18)
Glucose, Bld: 93 mg/dL (ref 65–99)
Potassium: 4.7 mmol/L (ref 3.5–5.3)
Sodium: 137 mmol/L (ref 135–146)

## 2014-12-14 LAB — LIPID PANEL
Cholesterol: 176 mg/dL (ref 125–200)
HDL: 32 mg/dL — ABNORMAL LOW (ref >=40)
LDL Cholesterol: 117 mg/dL (ref <130)
Total CHOL/HDL Ratio: 5.5 Ratio — ABNORMAL HIGH (ref <=5.0)
Triglycerides: 133 mg/dL (ref <150)
VLDL: 27 mg/dL (ref <30)

## 2014-12-14 NOTE — Patient Instructions (Addendum)
Medication Instructions:  DECREASE YOUR BISOPROLOL TO 2.5 MG DAILY (TAKE 1/2 OF THE 5 MG TABLET)  Labwork: LP/BMET/HFP  Testing/Procedures: NONE  Follow-Up: Your physician recommends that you schedule a follow-up appointment in: 4 month ov with Tera Helper NP or Brynda Rim PA

## 2014-12-14 NOTE — Progress Notes (Signed)
Cardiology Office Note   Date:  12/14/2014   ID:  William Elliott, DOB May 08, 1940, MRN [id number]  PCP:  [name], MD  Cardiologist: Darlin Coco MD  No chief complaint on file.     History of Present Illness: William Elliott is a 74 y.o. male who presents for scheduled follow-up visit.  This pleasant 74 year old gentleman is seen for a four-month followup office visit. He has a history of ischemic heart disease. He had successful CABG in 1999. He has a history of an asymptomatic left carotid bruit. He has had prior Dopplers showing no significant stenosis. The patient has a history of erectile dysfunction responsive to Viagra. He has a history of essential hypertension and dyslipidemia. His last echocardiogram was in 4268 showing diastolic dysfunction, aortic sclerosis and mild mitral regurgitation. He has a history of COPD and asthma and is under the care of Dr. Melvyn Novas who recently switched him from metoprolol to bisoprolol. The patient has not been getting much exercise. He denies any chest pain or angina. No peripheral edema. No symptoms of CHF.  He has been relatively sedentary over the summer but intends to start walking again.  His weight is up 6 pounds since last visit.  Past Medical History  Diagnosis Date  . Coronary artery disease     CABG - 1999  . Hyperlipidemia   . Carotid bruit     Asymptomatic left carotid bruit  . Ischemic heart disease   . Erectile dysfunction   . Dyslipidemia   . COPD (chronic obstructive pulmonary disease) ([place])   . History of Doppler ultrasound 04/13/2007    1. 20-39% stenosis noted in bilateral internal carotid arteries. 1. 20-39% stenosis noted in bilateral internal carotid arteries. distal common carotid artery.  3. Antegrade bilateral vertebral arteries.    Past Surgical History  Procedure Laterality Date  . Coronary artery bypass graft  1999  . Leg surgery  1995     left leg [facility]  . Cardiac catheterization        Current Outpatient Prescriptions  Medication Sig Dispense Refill  . Albuterol Sulfate 108 (90 BASE) MCG/ACT AEPB Inhale 2 puffs into the lungs 4 (four) times daily as needed (wheezing and sob).    . ALPRAZolam (XANAX) 1 MG tablet Take 1 tablet (1 mg total) by mouth 4 (four) times daily. 120 tablet 2  . amLODipine (NORVASC) 5 MG tablet TAKE 1 TABLET BY MOUTH EVERY DAY 30 tablet 10  . aspirin 81 MG tablet Take 81 mg by mouth daily.    Marland Kitchen b complex vitamins tablet Take 1 tablet by mouth daily.    . bisoprolol (ZEBETA) 5 MG tablet Take 5 mg by mouth as directed. 1/2 TABLET DAILY    . Cholecalciferol 5000 UNITS capsule Take 5,000 Units by mouth daily.    . fluticasone (FLONASE) 50 MCG/ACT nasal spray USE 2 SPRAYS INTO EACH NOSTRIL DAILY. 16 g 2  . hydrochlorothiazide (HYDRODIURIL) 25 MG tablet TAKE 1 TABLET BY MOUTH EVERY DAY. 30 tablet 10  . losartan (COZAAR) 100 MG tablet Take 1 tablet (100 mg total) by mouth daily. 90 tablet 2  . multivitamin (THERAGRAN) per tablet Take 1 tablet by mouth daily.      . nitroGLYCERIN (NITROSTAT) 0.4 MG SL tablet Place 1 tablet (0.4 mg total) under the tongue every 5 (five) minutes as needed for chest pain. 90 tablet 3  . omega-3 acid ethyl esters (LOVAZA) 1 G capsule Take 2 g by  mouth 2 (two) times daily as needed (for blood thinner).    . potassium chloride SA (K-DUR,KLOR-CON) 20 MEQ tablet TAKE 1 TABLET BY MOUTH TWICE DAILY 180 tablet 3  . sildenafil (VIAGRA) 100 MG tablet Take 100 mg by mouth daily as needed (for erectile dysfunction).     . Tiotropium Bromide-Olodaterol (STIOLTO RESPIMAT) 2.5-2.5 MCG/ACT AERS Inhale 2 puffs into the lungs daily. 1 Inhaler 11  . Umeclidinium-Vilanterol 62.5-25 MCG/INH AEPB Inhale 1 puff into the lungs daily. 1 each 6  . vitamin C (ASCORBIC ACID) 500 MG tablet Take 500 mg by mouth daily.    Marland Kitchen ZETIA 10 MG tablet TAKE 1 TABLET BY MOUTH EVERY DAY 30 tablet 10   No current facility-administered medications for this visit.     Allergies:   Fenofibrate; Lipitor; Niaspan; Omacor; and Zocor    Social History:  The patient  reports that he quit smoking about 17 years ago. His smoking use included Cigars. He does not have any smokeless tobacco history on file. He reports that he does not drink alcohol or use illicit drugs.   Family History:  The patient's family history includes Heart attack in his father; Heart disease in his father; Hypertension in his mother; Stroke in his mother.    ROS:  Please see the history of present illness.   Otherwise, review of systems are positive for none.   All other systems are reviewed and negative.    PHYSICAL EXAM: VS:  BP 136/74 mmHg  Pulse 47  Ht 5\' 10"  (1.778 m)  Wt 211 lb (95.709 kg)  BMI 30.28 kg/m2 , BMI Body mass index is 30.28 kg/(m^2). GEN: Well nourished, well developed, in no acute distress HEENT: normal Neck: no JVD, carotid bruits, or masses Cardiac: RRR; no murmurs, rubs, or gallops,no edema  Respiratory:  clear to auscultation bilaterally, normal work of breathing GI: soft, nontender, nondistended, + BS MS: no deformity or atrophy Skin: warm and dry, no rash Neuro:  Strength and sensation are intact Psych: euthymic mood, full affect   EKG:  EKG is ordered today. The ekg ordered today demonstrates marked sinus bradycardia with heart rate of 44 bpm.  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   Recent Labs: 08/09/2014: ALT 25; BUN 14; Creatinine, Ser 0.95; Potassium 4.0; Sodium 134*    Lipid Panel    Component Value Date/Time   CHOL 166 08/09/2014 1423   TRIG 104.0 08/09/2014 1423   HDL 35.10* 08/09/2014 1423   CHOLHDL 5 08/09/2014 1423   VLDL 20.8 08/09/2014 1423   LDLCALC 110* 08/09/2014 1423   LDLDIRECT 117.0 04/20/2014 1303      Wt Readings from Last 3 Encounters:  12/14/14 211 lb (95.709 kg)  08/09/14 205 lb 1.9 oz (93.042 kg)  07/26/14 202 lb (91.627 kg)        ASSESSMENT AND PLAN:  1. ischemic heart disease status post  CABG in 1999. 2. essential hypertension without heart disease 3. COPD 4. erectile dysfunction 5. BPH with nocturia 6. Hypercholesterolemia 7. left carotid bruit, asymptomatic   Current medicines are reviewed at length with the patient today.  The patient does not have concerns regarding medicines.  The following changes have been made:  Because of his marked sinus bradycardia and his first-degree block we are reducing his bisoprolol to just 2.5 mg daily  Labs/ tests ordered today include:   Orders Placed This Encounter  Procedures  . Lipid panel  . Hepatic function panel  . Basic metabolic  panel  . EKG 12-Lead   Disposition: Recheck in 4 months for follow-up office visit lipid panel hepatic function panel and basal metabolic panel.  Lab today is pending.  William Spare MD 12/14/2014 6:03 PM    [facility], [place], [place]  [address] Phone: [phone]; Fax: [phone]

## 2014-12-16 NOTE — Progress Notes (Signed)
Quick Note:  Please report to patient. The recent labs are stable. Continue same medication and careful diet. HDL is low. Needs more exercise. ______

## 2015-02-19 ENCOUNTER — Other Ambulatory Visit: Payer: Self-pay | Admitting: Cardiology

## 2015-02-19 DIAGNOSIS — F419 Anxiety disorder, unspecified: Secondary | ICD-10-CM

## 2015-02-19 NOTE — Telephone Encounter (Signed)
Pt requesting a refill on Alprazolam 1 mg tablet. Please advise

## 2015-02-20 ENCOUNTER — Other Ambulatory Visit: Payer: Self-pay | Admitting: Internal Medicine

## 2015-02-20 ENCOUNTER — Other Ambulatory Visit: Payer: Self-pay | Admitting: Cardiology

## 2015-02-20 DIAGNOSIS — F419 Anxiety disorder, unspecified: Secondary | ICD-10-CM

## 2015-02-23 ENCOUNTER — Other Ambulatory Visit: Payer: Self-pay | Admitting: Internal Medicine

## 2015-02-26 ENCOUNTER — Other Ambulatory Visit: Payer: Self-pay | Admitting: Internal Medicine

## 2015-02-26 ENCOUNTER — Other Ambulatory Visit: Payer: Self-pay | Admitting: Cardiology

## 2015-02-26 NOTE — Telephone Encounter (Signed)
Called in Rx

## 2015-02-26 NOTE — Telephone Encounter (Signed)
Rx was phone in by Rip Harbour, RN, Dr Sherryl Barters nurse, on 02/20/15, with 2 refills.

## 2015-02-27 ENCOUNTER — Telehealth: Payer: Self-pay | Admitting: Cardiology

## 2015-02-27 NOTE — Telephone Encounter (Signed)
Pt requesting a refill on this medication. Please advise °

## 2015-02-27 NOTE — Telephone Encounter (Signed)
We will be choosing one for him in the next 6-8 weeks. Does he have any special requests? Or would he like one of our cardiologists in [place] or [place]?

## 2015-02-27 NOTE — Telephone Encounter (Signed)
Pt would like to know which cardiologist he been assigned to now that Dr. Mare Ferrari is retiring. Pt said that his next appointment is with Richardson Dopp and he knows Nicki Reaper is a PA. Pt  would like to know the name of his new cardiologist.

## 2015-02-27 NOTE — Telephone Encounter (Signed)
New message    Patient calling wants to discuss switch over MD since Dr. Mare Ferrari  retiring

## 2015-03-01 MED ORDER — OMEPRAZOLE MAGNESIUM 20 MG PO TBEC: 20.0000 mg | DELAYED_RELEASE_TABLET | Freq: Every day | ORAL | Status: DC

## 2015-03-01 NOTE — Telephone Encounter (Signed)
Pt c/o of Chest Pain: STAT if CP now or developed within 24 hours  1. Are you having CP right now? No  2. Are you experiencing any other symptoms (ex. SOB, nausea, vomiting, sweating)? Jaw, neck, teeth and both arms hurt   3. How long have you been experiencing CP? 15-11mins  4. Is your CP continuous or coming and going? continuous  5. Have you taken Nitroglycerin? no ? Pt stated it only happens at night and and goes away when he takes a Xanax.

## 2015-03-01 NOTE — Telephone Encounter (Signed)
Dr. Domenic Polite would be a good choice.

## 2015-03-01 NOTE — Telephone Encounter (Addendum)
Patient c/o nightly chest, BUE, jaw, neck, teeth and back pain for 2 weeks.  He describes the pain as "sharp" and "moving" and occurs almost immediately after he lay down at night in bed.  It resolves after he goes in the living room, sits in his chair, takes a Xanax and waits 20 minutes.  He is then able to get to sleep for the night.  He does not have any other symptoms, although he reports chronic SOB secondary to COPD and asthma.  Patient st these problems started "when Dr. Melvyn Novas put him on all these breathing meds." Instructed patient to call Dr. Gustavus Bryant office to report potential side effects to medications. Yesterday, BP= 141/65. HR=55. Reviewed symptoms with Dr. Angelena Form.  Instructed patient to start OTC Prilosec. FU OV with Richardson Dopp rescheduled to 1/11.  Patient agrees with treatment plan.  Patient st he wishes to be followed by a physician in [place] (first choice) or [place] (second choice) after Dr. Wendall Papa.

## 2015-03-06 NOTE — Telephone Encounter (Signed)
Patient aware who he will follow up with

## 2015-03-08 ENCOUNTER — Other Ambulatory Visit: Payer: Self-pay | Admitting: Internal Medicine

## 2015-03-13 ENCOUNTER — Ambulatory Visit (INDEPENDENT_AMBULATORY_CARE_PROVIDER_SITE_OTHER): Payer: Medicare Other | Admitting: Physician Assistant

## 2015-03-13 ENCOUNTER — Encounter: Payer: Self-pay | Admitting: Physician Assistant

## 2015-03-13 ENCOUNTER — Encounter: Payer: Self-pay | Admitting: *Deleted

## 2015-03-13 VITALS — BP 102/60 | HR 55 | Ht 70.0 in | Wt 209.8 lb

## 2015-03-13 DIAGNOSIS — E785 Hyperlipidemia, unspecified: Secondary | ICD-10-CM

## 2015-03-13 DIAGNOSIS — I6523 Occlusion and stenosis of bilateral carotid arteries: Secondary | ICD-10-CM

## 2015-03-13 DIAGNOSIS — I119 Hypertensive heart disease without heart failure: Secondary | ICD-10-CM

## 2015-03-13 DIAGNOSIS — J449 Chronic obstructive pulmonary disease, unspecified: Secondary | ICD-10-CM

## 2015-03-13 DIAGNOSIS — I2510 Atherosclerotic heart disease of native coronary artery without angina pectoris: Secondary | ICD-10-CM | POA: Diagnosis not present

## 2015-03-13 DIAGNOSIS — R001 Bradycardia, unspecified: Secondary | ICD-10-CM

## 2015-03-13 DIAGNOSIS — I25110 Atherosclerotic heart disease of native coronary artery with unstable angina pectoris: Secondary | ICD-10-CM

## 2015-03-13 DIAGNOSIS — R011 Cardiac murmur, unspecified: Secondary | ICD-10-CM

## 2015-03-13 LAB — CBC WITH DIFFERENTIAL/PLATELET
Basophils Absolute: 0 10*3/uL (ref 0.0–0.1)
Basophils Relative: 0 % (ref 0–1)
Eosinophils Absolute: 0.1 10*3/uL (ref 0.0–0.7)
Eosinophils Relative: 1 % (ref 0–5)
HCT: 41.3 % (ref 39.0–52.0)
Hemoglobin: 14.2 g/dL (ref 13.0–17.0)
Lymphocytes Relative: 17 % (ref 12–46)
Lymphs Abs: 1.8 10*3/uL (ref 0.7–4.0)
MCH: 31.8 pg (ref 26.0–34.0)
MCHC: 34.4 g/dL (ref 30.0–36.0)
MCV: 92.4 fL (ref 78.0–100.0)
MPV: 11.4 fL (ref 8.6–12.4)
Monocytes Absolute: 1 10*3/uL (ref 0.1–1.0)
Monocytes Relative: 9 % (ref 3–12)
Neutro Abs: 7.8 10*3/uL — ABNORMAL HIGH (ref 1.7–7.7)
Neutrophils Relative %: 73 % (ref 43–77)
Platelets: 255 10*3/uL (ref 150–400)
RBC: 4.47 MIL/uL (ref 4.22–5.81)
RDW: 13.3 % (ref 11.5–15.5)
WBC: 10.7 10*3/uL — ABNORMAL HIGH (ref 4.0–10.5)

## 2015-03-13 LAB — BASIC METABOLIC PANEL
BUN: 14 mg/dL (ref 7–25)
CO2: 26 mmol/L (ref 20–31)
Calcium: 9.4 mg/dL (ref 8.6–10.3)
Chloride: 99 mmol/L (ref 98–110)
Creat: 0.98 mg/dL (ref 0.70–1.18)
Glucose, Bld: 90 mg/dL (ref 65–99)
Potassium: 4.1 mmol/L (ref 3.5–5.3)
Sodium: 136 mmol/L (ref 135–146)

## 2015-03-13 MED ORDER — BISOPROLOL FUMARATE 5 MG PO TABS: 2.5000 mg | ORAL_TABLET | Freq: Every day | ORAL | Status: DC

## 2015-03-13 MED ORDER — ISOSORBIDE MONONITRATE ER 30 MG PO TB24: 30.0000 mg | ORAL_TABLET | Freq: Every day | ORAL | Status: DC

## 2015-03-13 MED ORDER — LOSARTAN POTASSIUM 100 MG PO TABS: 50.0000 mg | ORAL_TABLET | Freq: Every day | ORAL | Status: DC

## 2015-03-13 NOTE — Progress Notes (Addendum)
Cardiology Office Note    Date:  03/13/2015   ID:  William Elliott, DOB December 23, 1940, MRN [id number]  PCP:  [name], MD  Cardiologist:  Dr. Darlin Coco >> Dr. [name] [facility])  Electrophysiologist:  n/a  Chief Complaint  Patient presents with  . Follow-up    pt has chest pain and has needed to take nitro pills  . Coronary Artery Disease    History of Present Illness:  William Elliott is a 75 y.o. male with a hx of CAD s/p CABG in 1999, carotid stenosis, ED, HL, HTN, COPD/asthma.  His notes indicate he had an echo in 2008 with normal LVF, mod LVH, diastolic dysfunction, mild aortic sclerosis, mild MR.  Last seen by Dr. Darlin Coco 10/16.  Metoprolol had previously been changed to Bisoprolol by Dr. Melvyn Novas (pulmonology).  He was noted to be bradycardic and had 1st degree AVB on his ECG.  Bisoprolol dose was reduced.  Four month FU was recommended.  He called in recently with complaints of chest pain.  He was asked to FU with Dr. Melvyn Novas regarding his pulmonary medications and to start on a PPI.  FU was arranged today.  The patient notes substernal chest discomfort over the past 3 weeks. This typically occurs when he lays down and has awoken him from sleep. It has been quite severe with radiation up into his jaw and teeth and down both arms. He denies associated dyspnea, nausea or diaphoresis. Symptoms are similar to previous angina. He has not had anginal symptoms since his bypass surgery. He has not taken nitroglycerin since his bypass surgery until last night. His symptoms were quite severe last night. His chest pain completely resolved with 2 nitroglycerin. He denies syncope, orthopnea, PND or edema. He denies dyspepsia, dysphagia, odynophagia, vomiting, diarrhea.   Past Medical History  Diagnosis Date  . Coronary artery disease     CABG - 1999  . Hyperlipidemia   . Carotid bruit     Asymptomatic left carotid bruit  . Ischemic heart disease   . Erectile dysfunction   .  Dyslipidemia   . COPD (chronic obstructive pulmonary disease) ([place])   . History of Doppler ultrasound 04/13/2007    1. 20-39% stenosis noted in bilateral internal carotid arteries. 1. 20-39% stenosis noted in bilateral internal carotid arteries. distal common carotid artery.  3. Antegrade bilateral vertebral arteries.    Past Surgical History  Procedure Laterality Date  . Coronary artery bypass graft  1999  . Leg surgery  1995     left leg [facility]  . Cardiac catheterization      Current Outpatient Prescriptions  Medication Sig Dispense Refill  . Albuterol Sulfate 108 (90 BASE) MCG/ACT AEPB Inhale 2 puffs into the lungs 4 (four) times daily as needed (wheezing and sob).    . ALPRAZolam (XANAX) 1 MG tablet TAKE 1 TABLET BY MOUTH FOUR TIMES DAILY 120 tablet 2  . amLODipine (NORVASC) 5 MG tablet TAKE 1 TABLET BY MOUTH EVERY DAY 30 tablet 10  . aspirin 81 MG tablet Take 81 mg by mouth daily.    Marland Kitchen b complex vitamins tablet Take 1 tablet by mouth daily.    . Cholecalciferol 5000 UNITS capsule Take 5,000 Units by mouth daily.    . fluticasone (FLONASE) 50 MCG/ACT nasal spray USE 2 SPRAYS INTO EACH NOSTRIL DAILY. 16 g 2  . hydrochlorothiazide (HYDRODIURIL) 25 MG tablet TAKE 1 TABLET BY MOUTH EVERY DAY. 30 tablet 10  . losartan (  COZAAR) 100 MG tablet Take 0.5 tablets (50 mg total) by mouth daily.    . multivitamin (THERAGRAN) per tablet Take 1 tablet by mouth daily.      . nitroGLYCERIN (NITROSTAT) 0.4 MG SL tablet Place 1 tablet (0.4 mg total) under the tongue every 5 (five) minutes as needed for chest pain. 90 tablet 3  . omega-3 acid ethyl esters (LOVAZA) 1 G capsule Take 2 g by mouth 2 (two) times daily as needed (for blood thinner).    Marland Kitchen omeprazole (PRILOSEC OTC) 20 MG tablet Take 1 tablet (20 mg total) by mouth daily.    . potassium chloride SA (K-DUR,KLOR-CON) 20 MEQ tablet TAKE 1 TABLET BY MOUTH TWICE DAILY 180 tablet 3  . sildenafil (VIAGRA) 100 MG tablet Take 100 mg by mouth  daily as needed (for erectile dysfunction).     . Tiotropium Bromide-Olodaterol (STIOLTO RESPIMAT) 2.5-2.5 MCG/ACT AERS Inhale 2 puffs into the lungs daily. 1 Inhaler 11  . Umeclidinium-Vilanterol 62.5-25 MCG/INH AEPB Inhale 1 puff into the lungs daily. 1 each 6  . vitamin C (ASCORBIC ACID) 500 MG tablet Take 500 mg by mouth daily.    Marland Kitchen ZETIA 10 MG tablet TAKE 1 TABLET BY MOUTH EVERY DAY 30 tablet 10  . bisoprolol (ZEBETA) 5 MG tablet Take 0.5 tablets (2.5 mg total) by mouth daily.    . isosorbide mononitrate (IMDUR) 30 MG 24 hr tablet Take 1 tablet (30 mg total) by mouth daily. 30 tablet 11   No current facility-administered medications for this visit.    Allergies:   Fenofibrate; Lipitor; Niaspan; [place]; and Zocor   Social History   Social History  . Marital Status: Married    Spouse Name: N/A  . Number of Children: N/A  . Years of Education: N/A   Occupational History  . Welder x 10 yrs     Social History Main Topics  . Smoking status: Former Smoker    Types: Cigars    Quit date: 03/02/1997  . Smokeless tobacco: None     Comment: smoked cigars x 10 yrs, never smoked cigs  . Alcohol Use: No  . Drug Use: No  . Sexual Activity: Not Asked   Other Topics Concern  . None   Social History Narrative     Family History:  The patient's family history includes Heart attack in his father; Heart disease in his father; Hypertension in his mother; Stroke in his mother.   ROS:   Please see the history of present illness.    Review of Systems  HENT: Positive for hearing loss.   Cardiovascular: Positive for chest pain and dyspnea on exertion.  Musculoskeletal: Positive for back pain and joint pain.  Psychiatric/Behavioral: Positive for depression. The patient is nervous/anxious.   All other systems reviewed and are negative.   PHYSICAL EXAM:   VS:  BP 102/60 mmHg  Pulse 55  Ht 5\' 10"  (1.778 m)  Wt 209 lb 12.8 oz (95.165 kg)  BMI 30.10 kg/m2  SpO2 99%   GEN: Well  nourished, well developed, in no acute distress HEENT: normal Neck: no JVD, no masses Cardiac: Normal S1/S2, RRR; [id number] systolic murmur LLSB,    Respiratory:  clear to auscultation bilaterally; no wheezing, rhonchi or rales GI: soft, nontender, nondistended, + BS MS: no deformity or atrophy Skin: warm and dry, no rash Neuro:   no focal deficits  Psych: Alert and oriented x 3, normal affect  Wt Readings from Last 3 Encounters:  03/13/15 209 lb 12.8  oz (95.165 kg)  12/14/14 211 lb (95.709 kg)  08/09/14 205 lb 1.9 oz (93.042 kg)      Studies/Labs Reviewed:   EKG:  EKG is  ordered today.  The ekg ordered today demonstrates sinus bradycardia, HR 52, normal axis, diffuse ST depression, first-degree AV block (PR 324 ms), QTc 427 ms  Recent Labs: 12/14/2014: ALT 29; BUN 15; Creat 1.05; Potassium 4.7; Sodium 137   Recent Lipid Panel    Component Value Date/Time   CHOL 176 12/14/2014 1504   TRIG 133 12/14/2014 1504   HDL 32* 12/14/2014 1504   CHOLHDL 5.5* 12/14/2014 1504   VLDL 27 12/14/2014 1504   LDLCALC 117 12/14/2014 1504   LDLDIRECT 117.0 04/20/2014 1303    Additional studies/ records that were reviewed today include:   Carotid US 04/2007 Bilateral ICA 20-39%    ASSESSMENT:    1. Coronary artery disease involving native coronary artery of native heart with unstable angina pectoris ([place])   2. Benign hypertensive heart disease without heart failure   3. Hyperlipidemia   4. Chronic obstructive pulmonary disease, unspecified COPD type ([place])   5. Carotid stenosis, bilateral   6. Bradycardia   7. Murmur     PLAN:  In order of problems listed above:  1. CAD - s/p remote CABG.  He presents today with complaints of worsening chest discomfort similar to previous angina. He is mainly having nocturnal symptoms. He has some subtle ST depression noted on ECG which appears to be fairly new. He is not currently having chest symptoms. I have recommended proceeding with cardiac  catheterization to further evaluate his symptoms. I reviewed this with Dr. Rayann Heman (DOD) who agreed. Risks and benefits of cardiac catheterization have been discussed with the patient.  These include bleeding, infection, kidney damage, stroke, heart attack, death.  The patient understands these risks and is willing to proceed.   -  Arrange LHC within the next week  -  Start isosorbide mononitrate 30 mg daily  -  Obtain echocardiogram given systolic murmur  -  Patient advised to go to the ED if he has recurrent or worsening symptoms  2. HTN - Blood pressure would not likely tolerate isosorbide without adjusting other medications. Therefore, I have recommended decreasing Losartan to 50 mg QD.  3. HL - Statin intolerant.  Continue Zetia.  4. COPD - He is asking for a refill on one of the inhalers given to him by Pulmonology.  I have asked him to contact Dr. Melvyn Novas for a refill.    5. Carotid stenosis - hx of asymptomatic L carotid bruit and mild ICA stenosis by carotid US in 2009.  Continue ASA.    6. Bradycardia - Improved. Unfortunately, the pharmacy gave him a higher dose of Bisoprolol when he last filled his medication.  I have reminded him to take Bisoprolol 2.5 mg only (1/2 tab of 5 mg).  With his 1st degree AVB, we may need to DC his beta-blocker altogether, eventually.  I have asked him to take the Bisoprolol 10 mg tabs back to the pharmacy so they can correct the mistake.   7. Murmur - Get Echo.     Medication Adjustments/Labs and Tests Ordered: Current medicines are reviewed at length with the patient today.  Concerns regarding medicines are outlined above.  Medication changes, Labs and Tests ordered today are outlined in the Patient Instructions noted below.  Signed, Richardson Dopp, PA-C  03/13/2015 4:35 PM    [facility] [id number]  [address], [place], [place]  [address] Phone: [phone]; Fax: [phone]    Patient Instructions  Medication Instructions:  1.  DECREASE LOSARTAN TO 50 MG DAILY (THIS WILL BE 1/2 TAB OF THE 100 MG TABLET)  2. CONTINUE ON THE BISOPROLOL 5 MG TABLET WITH THE DIRECTIONS TO TAKE 1/2 TABLET = 2.5 MG DAILY  3. START IMDUR 30 MG DAILY; RX SENT IN  4. YOU SHOULD BRING THE BOTTLE OF BISOPROLOL 10 MG TABLET BACK TO THE PHARMACY AND ASK FOR THEM TO PLEASE GIVE YOU THE CORRECT DOSE OF 5 MG TABLET WITH DIRECTIONS TO TAKE 1/2 TABLET DAILY = 2.5 MG DAILY  Labwork: TODAY BMET, CBC W/DIFF, PT/INR  Testing/Procedures: Your physician has requested that you have an echocardiogram THIS IS TO BE DONE AT [place]; PER Cyanne Delmar, PAC TO HAVE DONE EITHER 1/12 OR 1/13 SO THAT IT WILL BE DONE BEFORE CATH ON 1/16. Echocardiography is a painless test that uses sound waves to create images of your heart. It provides your doctor with information about the size and shape of your heart and how well your heart's chambers and valves are working. This procedure takes approximately one hour. There are no restrictions for this procedure.  Your physician has requested that you have a cardiac catheterization. Cardiac catheterization is used to diagnose and/or treat various heart conditions. Doctors may recommend this procedure for a number of different reasons. The most common reason is to evaluate chest pain. Chest pain can be a symptom of coronary artery disease (CAD), and cardiac catheterization can show whether plaque is narrowing or blocking your heart's arteries. This procedure is also used to evaluate the valves, as well as measure the blood flow and oxygen levels in different parts of your heart. For further information please visit [facility]. Please follow instruction sheet, as given.   Follow-Up: 04/01/15 @ 3:20 WITH William Elliott, PAC   Any Other Special Instructions Will Be Listed Below (If Applicable).   If you need a refill on your cardiac medications before your next appointment, please call your pharmacy.     Discussed with  Richardson Dopp PAC today Thompson Grayer MD, [facility] 03/13/2015 4:44 PM

## 2015-03-13 NOTE — Patient Instructions (Signed)
Medication Instructions:  1. DECREASE LOSARTAN TO 50 MG DAILY (THIS WILL BE 1/2 TAB OF THE 100 MG TABLET)  2. CONTINUE ON THE BISOPROLOL 5 MG TABLET WITH THE DIRECTIONS TO TAKE 1/2 TABLET = 2.5 MG DAILY  3. START IMDUR 30 MG DAILY; RX SENT IN  4. YOU SHOULD BRING THE BOTTLE OF BISOPROLOL 10 MG TABLET BACK TO THE PHARMACY AND ASK FOR THEM TO PLEASE GIVE YOU THE CORRECT DOSE OF 5 MG TABLET WITH DIRECTIONS TO TAKE 1/2 TABLET DAILY = 2.5 MG DAILY  Labwork: TODAY BMET, CBC W/DIFF, PT/INR  Testing/Procedures: Your physician has requested that you have an echocardiogram THIS IS TO BE DONE AT [place]; PER SCOTT WEAVER, PAC TO HAVE DONE EITHER 1/12 OR 1/13 SO THAT IT WILL BE DONE BEFORE CATH ON 1/16. Echocardiography is a painless test that uses sound waves to create images of your heart. It provides your doctor with information about the size and shape of your heart and how well your heart's chambers and valves are working. This procedure takes approximately one hour. There are no restrictions for this procedure.  Your physician has requested that you have a cardiac catheterization. Cardiac catheterization is used to diagnose and/or treat various heart conditions. Doctors may recommend this procedure for a number of different reasons. The most common reason is to evaluate chest pain. Chest pain can be a symptom of coronary artery disease (CAD), and cardiac catheterization can show whether plaque is narrowing or blocking your heart's arteries. This procedure is also used to evaluate the valves, as well as measure the blood flow and oxygen levels in different parts of your heart. For further information please visit [facility]tn. Please follow instruction sheet, as given.   Follow-Up: 04/01/15 @ 3:20 WITH SCOTT WEAVER, PAC   Any Other Special Instructions Will Be Listed Below (If Applicable).   If you need a refill on your cardiac medications before your next appointment, please call your  pharmacy.

## 2015-03-14 ENCOUNTER — Ambulatory Visit (HOSPITAL_BASED_OUTPATIENT_CLINIC_OR_DEPARTMENT_OTHER)
Admission: RE | Admit: 2015-03-14 | Discharge: 2015-03-14 | Disposition: A | Discharge status: Home / Self Care | Payer: Medicare Other | Source: Ambulatory Visit | Attending: Physician Assistant | Admitting: Physician Assistant

## 2015-03-14 DIAGNOSIS — R011 Cardiac murmur, unspecified: Secondary | ICD-10-CM

## 2015-03-14 DIAGNOSIS — J45909 Unspecified asthma, uncomplicated: Secondary | ICD-10-CM | POA: Diagnosis not present

## 2015-03-14 DIAGNOSIS — I10 Essential (primary) hypertension: Secondary | ICD-10-CM | POA: Insufficient documentation

## 2015-03-14 DIAGNOSIS — I6523 Occlusion and stenosis of bilateral carotid arteries: Secondary | ICD-10-CM | POA: Diagnosis not present

## 2015-03-14 DIAGNOSIS — R001 Bradycardia, unspecified: Secondary | ICD-10-CM | POA: Diagnosis not present

## 2015-03-14 DIAGNOSIS — E785 Hyperlipidemia, unspecified: Secondary | ICD-10-CM | POA: Insufficient documentation

## 2015-03-14 DIAGNOSIS — J449 Chronic obstructive pulmonary disease, unspecified: Secondary | ICD-10-CM | POA: Diagnosis not present

## 2015-03-14 DIAGNOSIS — I25110 Atherosclerotic heart disease of native coronary artery with unstable angina pectoris: Secondary | ICD-10-CM | POA: Diagnosis not present

## 2015-03-14 DIAGNOSIS — I214 Non-ST elevation (NSTEMI) myocardial infarction: Secondary | ICD-10-CM | POA: Diagnosis not present

## 2015-03-14 LAB — PROTIME-INR
INR: 0.98 (ref <1.50)
Prothrombin Time: 13.1 seconds (ref 11.6–15.2)

## 2015-03-15 ENCOUNTER — Encounter: Payer: Self-pay | Admitting: Physician Assistant

## 2015-03-15 ENCOUNTER — Telehealth: Payer: Self-pay | Admitting: *Deleted

## 2015-03-15 NOTE — Telephone Encounter (Signed)
Ptcb and has been notified of echo results and findings by phone with verbal understanding.

## 2015-03-15 NOTE — Telephone Encounter (Signed)
Lmom on phone labs ok per DPR on file to lmom if from [place] if any questions tcb [phone].

## 2015-03-15 NOTE — Telephone Encounter (Signed)
Lmptcb for echo results

## 2015-03-16 DIAGNOSIS — I200 Unstable angina: Secondary | ICD-10-CM | POA: Diagnosis present

## 2015-03-17 ENCOUNTER — Encounter (HOSPITAL_COMMUNITY): Payer: Self-pay | Admitting: *Deleted

## 2015-03-17 ENCOUNTER — Other Ambulatory Visit: Payer: Self-pay

## 2015-03-17 ENCOUNTER — Inpatient Hospital Stay (HOSPITAL_COMMUNITY)
Admission: EM | Admit: 2015-03-17 | Discharge: 2015-03-19 | DRG: 247 | Disposition: A | Discharge status: Home / Self Care | Payer: Medicare Other | Attending: Cardiovascular Disease | Admitting: Cardiovascular Disease

## 2015-03-17 ENCOUNTER — Emergency Department (HOSPITAL_COMMUNITY): Payer: Medicare Other

## 2015-03-17 DIAGNOSIS — J449 Chronic obstructive pulmonary disease, unspecified: Secondary | ICD-10-CM | POA: Diagnosis present

## 2015-03-17 DIAGNOSIS — I259 Chronic ischemic heart disease, unspecified: Secondary | ICD-10-CM | POA: Diagnosis present

## 2015-03-17 DIAGNOSIS — Z87891 Personal history of nicotine dependence: Secondary | ICD-10-CM

## 2015-03-17 DIAGNOSIS — J45909 Unspecified asthma, uncomplicated: Secondary | ICD-10-CM | POA: Diagnosis not present

## 2015-03-17 DIAGNOSIS — E785 Hyperlipidemia, unspecified: Secondary | ICD-10-CM | POA: Diagnosis present

## 2015-03-17 DIAGNOSIS — I209 Angina pectoris, unspecified: Secondary | ICD-10-CM | POA: Diagnosis not present

## 2015-03-17 DIAGNOSIS — I10 Essential (primary) hypertension: Secondary | ICD-10-CM | POA: Diagnosis present

## 2015-03-17 DIAGNOSIS — I440 Atrioventricular block, first degree: Secondary | ICD-10-CM | POA: Diagnosis present

## 2015-03-17 DIAGNOSIS — I25710 Atherosclerosis of autologous vein coronary artery bypass graft(s) with unstable angina pectoris: Secondary | ICD-10-CM | POA: Diagnosis present

## 2015-03-17 DIAGNOSIS — I2510 Atherosclerotic heart disease of native coronary artery without angina pectoris: Secondary | ICD-10-CM | POA: Diagnosis not present

## 2015-03-17 DIAGNOSIS — I25700 Atherosclerosis of coronary artery bypass graft(s), unspecified, with unstable angina pectoris: Secondary | ICD-10-CM | POA: Insufficient documentation

## 2015-03-17 DIAGNOSIS — Z955 Presence of coronary angioplasty implant and graft: Secondary | ICD-10-CM

## 2015-03-17 DIAGNOSIS — I25110 Atherosclerotic heart disease of native coronary artery with unstable angina pectoris: Secondary | ICD-10-CM | POA: Diagnosis present

## 2015-03-17 DIAGNOSIS — I214 Non-ST elevation (NSTEMI) myocardial infarction: Principal | ICD-10-CM | POA: Diagnosis present

## 2015-03-17 DIAGNOSIS — Z7982 Long term (current) use of aspirin: Secondary | ICD-10-CM | POA: Diagnosis not present

## 2015-03-17 DIAGNOSIS — I2489 Other forms of acute ischemic heart disease: Secondary | ICD-10-CM | POA: Diagnosis present

## 2015-03-17 DIAGNOSIS — R001 Bradycardia, unspecified: Secondary | ICD-10-CM | POA: Diagnosis present

## 2015-03-17 DIAGNOSIS — I6523 Occlusion and stenosis of bilateral carotid arteries: Secondary | ICD-10-CM | POA: Diagnosis present

## 2015-03-17 HISTORY — DX: Major depressive disorder, single episode, unspecified: F32.9

## 2015-03-17 HISTORY — DX: Gastrointestinal hemorrhage, unspecified: K92.2

## 2015-03-17 HISTORY — DX: Anxiety disorder, unspecified: F41.9

## 2015-03-17 HISTORY — DX: Unspecified asthma, uncomplicated: J45.909

## 2015-03-17 HISTORY — DX: Personal history of other medical treatment: Z92.89

## 2015-03-17 HISTORY — DX: Depression, unspecified: F32.A

## 2015-03-17 HISTORY — DX: Acute myocardial infarction, unspecified: I21.9

## 2015-03-17 LAB — BASIC METABOLIC PANEL
Anion gap: 12 (ref 5–15)
BUN: 12 mg/dL (ref 6–20)
CO2: 26 mmol/L (ref 22–32)
Calcium: 9.5 mg/dL (ref 8.9–10.3)
Chloride: 100 mmol/L — ABNORMAL LOW (ref 101–111)
Creatinine, Ser: 0.97 mg/dL (ref 0.61–1.24)
GFR calc Af Amer: >60 mL/min (ref >60)
GFR calc non Af Amer: >60 mL/min (ref >60)
Glucose, Bld: 106 mg/dL — ABNORMAL HIGH (ref 65–99)
Potassium: 3.7 mmol/L (ref 3.5–5.1)
Sodium: 138 mmol/L (ref 135–145)

## 2015-03-17 LAB — I-STAT TROPONIN, ED: Troponin i, poc: 0.09 ng/mL (ref 0.00–0.08)

## 2015-03-17 LAB — CBC
HCT: 39.8 % (ref 39.0–52.0)
Hemoglobin: 13.6 g/dL (ref 13.0–17.0)
MCH: 32.2 pg (ref 26.0–34.0)
MCHC: 34.2 g/dL (ref 30.0–36.0)
MCV: 94.3 fL (ref 78.0–100.0)
Platelets: 231 10*3/uL (ref 150–400)
RBC: 4.22 MIL/uL (ref 4.22–5.81)
RDW: 12.9 % (ref 11.5–15.5)
WBC: 5.5 10*3/uL (ref 4.0–10.5)

## 2015-03-17 MED ORDER — SODIUM CHLORIDE 0.9 % IJ SOLN: 3.0000 mL | INTRAMUSCULAR | Status: DC | PRN

## 2015-03-17 MED ORDER — ALPRAZOLAM 0.5 MG PO TABS
1.0000 mg | ORAL_TABLET | Freq: Four times a day (QID) | ORAL | Status: DC
  Administered 2015-03-17 – 2015-03-19 (×5): 1 mg via ORAL
  Filled 2015-03-17 (×6): qty 2

## 2015-03-17 MED ORDER — SODIUM CHLORIDE 0.9 % IV SOLN: 250.0000 mL | INTRAVENOUS | Status: DC | PRN

## 2015-03-17 MED ORDER — EZETIMIBE 10 MG PO TABS
10.0000 mg | ORAL_TABLET | Freq: Every day | ORAL | Status: DC
  Administered 2015-03-18 – 2015-03-19 (×2): 10 mg via ORAL
  Filled 2015-03-17 (×2): qty 1

## 2015-03-17 MED ORDER — SODIUM CHLORIDE 0.9 % IV SOLN
INTRAVENOUS | Status: DC
  Administered 2015-03-18: via INTRAVENOUS

## 2015-03-17 MED ORDER — NITROGLYCERIN 2 % TD OINT
1.0000 [in_us] | TOPICAL_OINTMENT | Freq: Four times a day (QID) | TRANSDERMAL | Status: DC
  Administered 2015-03-17 – 2015-03-19 (×5): 1 [in_us] via TOPICAL
  Filled 2015-03-17 (×2): qty 30

## 2015-03-17 MED ORDER — ENOXAPARIN SODIUM 40 MG/0.4ML ~~LOC~~ SOLN
40.0000 mg | SUBCUTANEOUS | Status: DC
  Administered 2015-03-17: 40 mg via SUBCUTANEOUS
  Filled 2015-03-17 (×3): qty 0.4

## 2015-03-17 MED ORDER — OMEPRAZOLE MAGNESIUM 20 MG PO TBEC: 20.0000 mg | DELAYED_RELEASE_TABLET | Freq: Every day | ORAL | Status: DC

## 2015-03-17 MED ORDER — AMLODIPINE BESYLATE 5 MG PO TABS
5.0000 mg | ORAL_TABLET | Freq: Every day | ORAL | Status: DC
  Administered 2015-03-18 – 2015-03-19 (×2): 5 mg via ORAL
  Filled 2015-03-17 (×2): qty 1

## 2015-03-17 MED ORDER — NITROGLYCERIN 0.4 MG SL SUBL
0.4000 mg | SUBLINGUAL_TABLET | SUBLINGUAL | Status: DC | PRN
  Administered 2015-03-18 (×2): 0.4 mg via SUBLINGUAL
  Filled 2015-03-17: qty 1

## 2015-03-17 MED ORDER — ENOXAPARIN SODIUM 40 MG/0.4ML ~~LOC~~ SOLN
40.0000 mg | SUBCUTANEOUS | Status: DC
  Filled 2015-03-17: qty 0.4

## 2015-03-17 MED ORDER — FLUTICASONE PROPIONATE 50 MCG/ACT NA SUSP
1.0000 | Freq: Every day | NASAL | Status: DC
  Administered 2015-03-18 – 2015-03-19 (×2): 1 via NASAL
  Filled 2015-03-17 (×2): qty 16

## 2015-03-17 MED ORDER — ONDANSETRON HCL 4 MG/2ML IJ SOLN: 4.0000 mg | Freq: Four times a day (QID) | INTRAMUSCULAR | Status: DC | PRN

## 2015-03-17 MED ORDER — BISOPROLOL FUMARATE 5 MG PO TABS
2.5000 mg | ORAL_TABLET | Freq: Every day | ORAL | Status: DC
  Administered 2015-03-18 – 2015-03-19 (×2): 2.5 mg via ORAL
  Filled 2015-03-17 (×2): qty 0.5

## 2015-03-17 MED ORDER — ACETAMINOPHEN 325 MG PO TABS: 650.0000 mg | ORAL_TABLET | ORAL | Status: DC | PRN

## 2015-03-17 MED ORDER — OMEGA-3-ACID ETHYL ESTERS 1 G PO CAPS
2.0000 g | ORAL_CAPSULE | Freq: Two times a day (BID) | ORAL | Status: DC
  Administered 2015-03-17 – 2015-03-19 (×4): 2 g via ORAL
  Filled 2015-03-17 (×4): qty 2

## 2015-03-17 MED ORDER — ALBUTEROL SULFATE (2.5 MG/3ML) 0.083% IN NEBU
2.5000 mg | INHALATION_SOLUTION | Freq: Four times a day (QID) | RESPIRATORY_TRACT | Status: DC | PRN
  Administered 2015-03-18: 2.5 mg via RESPIRATORY_TRACT
  Filled 2015-03-17: qty 3

## 2015-03-17 MED ORDER — SODIUM CHLORIDE 0.9 % IJ SOLN
3.0000 mL | Freq: Two times a day (BID) | INTRAMUSCULAR | Status: DC
  Administered 2015-03-17: 3 mL via INTRAVENOUS

## 2015-03-17 MED ORDER — ASPIRIN 81 MG PO CHEW
243.0000 mg | CHEWABLE_TABLET | Freq: Once | ORAL | Status: AC
  Administered 2015-03-17: 243 mg via ORAL
  Filled 2015-03-17: qty 3

## 2015-03-17 MED ORDER — B COMPLEX PO TABS: 1.0000 | ORAL_TABLET | Freq: Every day | ORAL | Status: DC

## 2015-03-17 MED ORDER — ENOXAPARIN SODIUM 30 MG/0.3ML ~~LOC~~ SOLN: 30.0000 mg | SUBCUTANEOUS | Status: DC

## 2015-03-17 MED ORDER — LOSARTAN POTASSIUM 50 MG PO TABS
50.0000 mg | ORAL_TABLET | Freq: Every day | ORAL | Status: DC
  Administered 2015-03-18 – 2015-03-19 (×2): 50 mg via ORAL
  Filled 2015-03-17 (×2): qty 1

## 2015-03-17 MED ORDER — VITAMIN D 1000 UNITS PO TABS
5000.0000 [IU] | ORAL_TABLET | Freq: Every day | ORAL | Status: DC
  Administered 2015-03-18: 11:00:00 5000 [IU] via ORAL
  Filled 2015-03-17 (×2): qty 5

## 2015-03-17 MED ORDER — ASPIRIN EC 81 MG PO TBEC
81.0000 mg | DELAYED_RELEASE_TABLET | Freq: Every day | ORAL | Status: DC
  Administered 2015-03-19: 09:00:00 81 mg via ORAL
  Filled 2015-03-17: qty 1

## 2015-03-17 MED ORDER — VITAMIN C 500 MG PO TABS
500.0000 mg | ORAL_TABLET | Freq: Every day | ORAL | Status: DC
  Administered 2015-03-18 – 2015-03-19 (×2): 500 mg via ORAL
  Filled 2015-03-17 (×2): qty 1

## 2015-03-17 MED ORDER — ASPIRIN 81 MG PO CHEW
81.0000 mg | CHEWABLE_TABLET | ORAL | Status: AC
  Administered 2015-03-18: 81 mg via ORAL
  Filled 2015-03-17: qty 1

## 2015-03-17 MED ORDER — B COMPLEX-C PO TABS
1.0000 | ORAL_TABLET | Freq: Every day | ORAL | Status: DC
  Administered 2015-03-18 – 2015-03-19 (×2): 1 via ORAL
  Filled 2015-03-17 (×3): qty 1

## 2015-03-17 MED ORDER — PANTOPRAZOLE SODIUM 40 MG PO TBEC
40.0000 mg | DELAYED_RELEASE_TABLET | Freq: Every day | ORAL | Status: DC
  Administered 2015-03-18 – 2015-03-19 (×2): 40 mg via ORAL
  Filled 2015-03-17 (×2): qty 1

## 2015-03-17 MED ORDER — ASPIRIN EC 81 MG PO TBEC: 81.0000 mg | DELAYED_RELEASE_TABLET | Freq: Every day | ORAL | Status: DC

## 2015-03-17 MED ORDER — ADULT MULTIVITAMIN W/MINERALS CH
1.0000 | ORAL_TABLET | Freq: Every day | ORAL | Status: DC
  Administered 2015-03-18 – 2015-03-19 (×2): 1 via ORAL
  Filled 2015-03-17 (×2): qty 1

## 2015-03-17 MED ORDER — POTASSIUM CHLORIDE CRYS ER 20 MEQ PO TBCR
20.0000 meq | EXTENDED_RELEASE_TABLET | Freq: Two times a day (BID) | ORAL | Status: DC
  Administered 2015-03-17 – 2015-03-19 (×4): 20 meq via ORAL
  Filled 2015-03-17 (×5): qty 1

## 2015-03-17 MED ORDER — ALPRAZOLAM 0.25 MG PO TABS
1.0000 mg | ORAL_TABLET | Freq: Once | ORAL | Status: AC
  Administered 2015-03-17: 1 mg via ORAL
  Filled 2015-03-17: qty 4

## 2015-03-17 MED ORDER — HYDROCHLOROTHIAZIDE 25 MG PO TABS
25.0000 mg | ORAL_TABLET | Freq: Every day | ORAL | Status: DC
  Administered 2015-03-18 – 2015-03-19 (×2): 25 mg via ORAL
  Filled 2015-03-17 (×2): qty 1

## 2015-03-17 NOTE — H&P (Signed)
William Elliott is a 75 year old married Caucasian male patient of Dr. Sherryl Barters seen by Richardson Dopp PA on 03/12/14. He has a history of remote coronary artery bypass grafting in 1999. He said 2-3 weeks of nitrate responsive chest pain and was scheduled for outpatient diagnostic coronary angiography by Dr. Ellyn Hack tomorrow. Because of accelerated chest pain requiring sublingual intervention was asked to come to the hospital today for admission. He is currently pain-free. His exam is benign. His labs are remarkable for a troponin of 0.09 and his EKG shows nonspecific ST and T-wave changes. I will continue telemetry bed, put him on Nitropaste and will keep him nothing by mouth after midnight for cardiac catheterization tomorrow.   Lorretta Harp, M.D., [place], [facility], [place] [address] [place], [place]  [address]  [phone] 03/17/2015 4:42 PM

## 2015-03-17 NOTE — ED Notes (Signed)
Pt c/o L sided CP that radiates to his back & bil jaw onset today @7am , pt scheduled to have heart cath tomorrow, pt states, "I went back to sleep & slept until 11 o'clock. When I woke up it wasn't hurting anymore. It hurst if I do any strenuous things." pt A&O x4, follows commands, speaks in complete sentences, pt hx of CABG

## 2015-03-17 NOTE — ED Provider Notes (Signed)
CSN: [id number]     Arrival date & time 03/17/15  1449 History   First MD Initiated Contact with Patient 03/17/15 1505     Chief Complaint  Patient presents with  . Chest Pain     (Consider location/radiation/quality/duration/timing/severity/associated sxs/prior Treatment) HPI Complains of anterior chest pain which raised the back and both arms and to his teeth onset 1 or 2 weeks ago. Pain is worse with lying spine improved with sitting up. He had an episode this morning. He treated himself with ZEBETA which seemed to have alleviated the pain. He's also been treating himself with sublingual nitroglycerin intermittently over the past few weeks with transient relief. He is presently asymptomatic. He denies any shortness of breath nausea or sweatiness. Patient suffered for cardiac catheterization tomorrow. Past Medical History  Diagnosis Date  . Coronary artery disease     CABG - 1999  . Hyperlipidemia   . Carotid bruit     Asymptomatic left carotid bruit  . Ischemic heart disease   . Erectile dysfunction   . Dyslipidemia   . COPD (chronic obstructive pulmonary disease) ([place])   . History of Doppler ultrasound 04/13/2007    1. 20-39% stenosis noted in bilateral internal carotid arteries. 1. 20-39% stenosis noted in bilateral internal carotid arteries. distal common carotid artery.  3. Antegrade bilateral vertebral arteries.  . History of echocardiogram     Echo 1/17: EF 60-65%, normal wall motion, grade 2 diastolic dysfunction, AV mean gradient 7 mmHg, MAC, mild MR, severe LAE   Past Surgical History  Procedure Laterality Date  . Coronary artery bypass graft  1999  . Leg surgery  1995     left leg [facility]  . Cardiac catheterization     Family History  Problem Relation Age of Onset  . Heart disease Father   . Heart attack Father   . Hypertension Mother   . Stroke Mother    Social History  Substance Use Topics  . Smoking status: Former Smoker    Types: Cigars    Quit  date: 03/02/1997  . Smokeless tobacco: None     Comment: smoked cigars x 10 yrs, never smoked cigs  . Alcohol Use: No    Review of Systems  Constitutional: Negative.   HENT: Negative.   Respiratory: Negative.   Cardiovascular: Positive for chest pain.  Gastrointestinal: Negative.   Musculoskeletal: Negative.   Skin: Negative.   Neurological: Negative.   Psychiatric/Behavioral: Negative.   All other systems reviewed and are negative.     Allergies  Anoro ellipta; Stiolto respimat; Fenofibrate; Lipitor; Niaspan; and Zocor  Home Medications   Prior to Admission medications   Medication Sig Start Date End Date Taking? Authorizing Provider  Albuterol Sulfate 108 (90 BASE) MCG/ACT AEPB Inhale 2 puffs into the lungs 4 (four) times daily as needed (wheezing and sob).   Yes Historical Provider, MD  ALPRAZolam Duanne Moron) 1 MG tablet TAKE 1 TABLET BY MOUTH FOUR TIMES DAILY 02/26/15  Yes Darlin Coco, MD  amLODipine (NORVASC) 5 MG tablet TAKE 1 TABLET BY MOUTH EVERY DAY 10/29/14  Yes Darlin Coco, MD  aspirin 81 MG tablet Take 81 mg by mouth daily.   Yes Historical Provider, MD  b complex vitamins tablet Take 1 tablet by mouth daily.   Yes Historical Provider, MD  bisoprolol (ZEBETA) 5 MG tablet Take 0.5 tablets (2.5 mg total) by mouth daily. 03/13/15  Yes Liliane Shi, PA-C  Cholecalciferol 5000 UNITS capsule Take 5,000 Units by mouth daily.  Yes Historical Provider, MD  fluticasone (FLONASE) 50 MCG/ACT nasal spray USE 2 SPRAYS INTO EACH NOSTRIL DAILY. 11/30/14  Yes Darlin Coco, MD  hydrochlorothiazide (HYDRODIURIL) 25 MG tablet TAKE 1 TABLET BY MOUTH EVERY DAY. 10/29/14  Yes Darlin Coco, MD  isosorbide mononitrate (IMDUR) 30 MG 24 hr tablet Take 1 tablet (30 mg total) by mouth daily. 03/13/15  Yes Scott Joylene Draft, PA-C  losartan (COZAAR) 100 MG tablet Take 0.5 tablets (50 mg total) by mouth daily. 03/13/15  Yes Scott Joylene Draft, PA-C  multivitamin [facility]) per tablet Take 1  tablet by mouth daily.     Yes Historical Provider, MD  nitroGLYCERIN (NITROSTAT) 0.4 MG SL tablet Place 1 tablet (0.4 mg total) under the tongue every 5 (five) minutes as needed for chest pain. 12/25/13  Yes Darlin Coco, MD  omega-3 acid ethyl esters (LOVAZA) 1 G capsule Take 2 g by mouth 2 (two) times daily.    Yes Historical Provider, MD  omeprazole (PRILOSEC OTC) 20 MG tablet Take 1 tablet (20 mg total) by mouth daily. Patient taking differently: Take 20 mg by mouth daily as needed (for heartburn).  03/01/15  Yes Burnell Blanks, MD  potassium chloride SA (K-DUR,KLOR-CON) 20 MEQ tablet TAKE 1 TABLET BY MOUTH TWICE DAILY 10/29/14  Yes Darlin Coco, MD  sildenafil (VIAGRA) 100 MG tablet Take 100 mg by mouth daily as needed (for erectile dysfunction).    Yes Historical Provider, MD  vitamin C (ASCORBIC ACID) 500 MG tablet Take 500 mg by mouth daily.   Yes Historical Provider, MD  ZETIA 10 MG tablet TAKE 1 TABLET BY MOUTH EVERY DAY 10/29/14  Yes Darlin Coco, MD  Tiotropium Bromide-Olodaterol (STIOLTO RESPIMAT) 2.5-2.5 MCG/ACT AERS Inhale 2 puffs into the lungs daily. 07/26/14   Tanda Rockers, MD  Umeclidinium-Vilanterol 62.5-25 MCG/INH AEPB Inhale 1 puff into the lungs daily. 08/14/14   Tanda Rockers, MD   BP 122/64 mmHg  Pulse 49  Temp(Src) 98 F (36.7 C) (Oral)  Resp 16  Ht 5\' 10"  (1.778 m)  SpO2 97% Physical Exam  Constitutional: He appears well-developed and well-nourished.  HENT:  Head: Normocephalic and atraumatic.  Eyes: Conjunctivae are normal. Pupils are equal, round, and reactive to light.  Neck: Neck supple. No tracheal deviation present. No thyromegaly present.  Cardiovascular: Regular rhythm.   No murmur heard. Bradycardic  Pulmonary/Chest: Effort normal and breath sounds normal.  Abdominal: Soft. Bowel sounds are normal. He exhibits no distension. There is no tenderness.  Musculoskeletal: Normal range of motion. He exhibits no edema or tenderness.   Neurological: He is alert. Coordination normal.  Skin: Skin is warm and dry. No rash noted.  Psychiatric: He has a normal mood and affect.  Nursing note and vitals reviewed.   ED Course  Procedures (including critical care time) Labs Review Labs Reviewed  BASIC METABOLIC PANEL - Abnormal; Notable for the following:    Chloride 100 (*)    Glucose, Bld 106 (*)    All other components within normal limits  I-STAT TROPOININ, ED - Abnormal; Notable for the following:    Troponin i, poc 0.09 (*)    All other components within normal limits  CBC    Imaging Review No results found. I have personally reviewed and evaluated these images and lab results as part of my medical decision-making.   EKG Interpretation   Date/Time:  Sunday March 17 2015 14:58:45 EST Ventricular Rate:  53 PR Interval:    QRS Duration: 102 QT Interval:  430 QTC Calculation: 403 R Axis:   42 Text Interpretation:  Undetermined rhythm Nonspecific ST abnormality  Abnormal ECG No old tracing to compare Confirmed by Winfred Leeds  MD, Rekita Miotke  [phone]) on 03/17/2015 3:30:46 PM     Results for orders placed or performed during the hospital encounter of [id number]  Basic metabolic panel  Result Value Ref Range   Sodium 138 135 - 145 mmol/L   Potassium 3.7 3.5 - 5.1 mmol/L   Chloride 100 (L) 101 - 111 mmol/L   CO2 26 22 - 32 mmol/L   Glucose, Bld 106 (H) 65 - 99 mg/dL   BUN 12 6 - 20 mg/dL   Creatinine, Ser 0.97 0.61 - 1.24 mg/dL   Calcium 9.5 8.9 - 10.3 mg/dL   GFR calc non Af Amer >60 >60 mL/min   GFR calc Af Amer >60 >60 mL/min   Anion gap 12 5 - 15  CBC  Result Value Ref Range   WBC 5.5 4.0 - 10.5 K/uL   RBC 4.22 4.22 - 5.81 MIL/uL   Hemoglobin 13.6 13.0 - 17.0 g/dL   HCT 39.8 39.0 - 52.0 %   MCV 94.3 78.0 - 100.0 fL   MCH 32.2 26.0 - 34.0 pg   MCHC 34.2 30.0 - 36.0 g/dL   RDW 12.9 11.5 - 15.5 %   Platelets 231 150 - 400 K/uL  I-stat troponin, ED (not at [facility], [facility])  Result Value Ref Range   Troponin  i, poc 0.09 (HH) 0.00 - 0.08 ng/mL   Comment NOTIFIED PHYSICIAN    Comment 3           No results found.  MDM  I spoke with Dr. Debara Pickett who will make arrangements for admission.aspirin ordered Final diagnoses:  None   Dx NSTEMI     Orlie Dakin, MD 03/17/15 [phone]

## 2015-03-18 ENCOUNTER — Other Ambulatory Visit: Payer: Self-pay

## 2015-03-18 ENCOUNTER — Ambulatory Visit (HOSPITAL_COMMUNITY): Admission: RE | Admit: 2015-03-18 | Payer: Medicare Other | Source: Ambulatory Visit | Admitting: Cardiology

## 2015-03-18 ENCOUNTER — Encounter (HOSPITAL_COMMUNITY)
Admission: EM | Disposition: A | Discharge status: Home / Self Care | Payer: Self-pay | Source: Home / Self Care | Attending: Cardiovascular Disease

## 2015-03-18 ENCOUNTER — Encounter (HOSPITAL_COMMUNITY): Payer: Self-pay | Admitting: Cardiology

## 2015-03-18 DIAGNOSIS — I200 Unstable angina: Secondary | ICD-10-CM

## 2015-03-18 DIAGNOSIS — I214 Non-ST elevation (NSTEMI) myocardial infarction: Secondary | ICD-10-CM | POA: Diagnosis present

## 2015-03-18 DIAGNOSIS — I25710 Atherosclerosis of autologous vein coronary artery bypass graft(s) with unstable angina pectoris: Secondary | ICD-10-CM

## 2015-03-18 DIAGNOSIS — I25700 Atherosclerosis of coronary artery bypass graft(s), unspecified, with unstable angina pectoris: Secondary | ICD-10-CM

## 2015-03-18 HISTORY — PX: CARDIAC CATHETERIZATION: SHX172

## 2015-03-18 LAB — POCT ACTIVATED CLOTTING TIME
Activated Clotting Time: 214 seconds
Activated Clotting Time: 260 seconds
Activated Clotting Time: 451 seconds

## 2015-03-18 LAB — TROPONIN I
Troponin I: 1.53 ng/mL (ref <0.031)
Troponin I: 6.06 ng/mL (ref <0.031)

## 2015-03-18 SURGERY — LEFT HEART CATH AND CORS/GRAFTS ANGIOGRAPHY

## 2015-03-18 MED ORDER — MIDAZOLAM HCL 2 MG/2ML IJ SOLN
INTRAMUSCULAR | Status: DC | PRN
  Administered 2015-03-18: 1 mg via INTRAVENOUS

## 2015-03-18 MED ORDER — ASPIRIN 325 MG PO TABS
ORAL_TABLET | ORAL | Status: DC | PRN
  Administered 2015-03-18: 325 mg via ORAL

## 2015-03-18 MED ORDER — LIDOCAINE HCL (PF) 1 % IJ SOLN
INTRAMUSCULAR | Status: DC | PRN
  Administered 2015-03-18: 10:00:00

## 2015-03-18 MED ORDER — ADENOSINE 6 MG/2ML IV SOLN
INTRAVENOUS | Status: DC | PRN
  Administered 2015-03-18: 30 ug via INTRAVENOUS

## 2015-03-18 MED ORDER — FENTANYL CITRATE (PF) 100 MCG/2ML IJ SOLN
INTRAMUSCULAR | Status: DC | PRN
  Administered 2015-03-18: 25 ug via INTRAVENOUS

## 2015-03-18 MED ORDER — CLOPIDOGREL BISULFATE 300 MG PO TABS
ORAL_TABLET | ORAL | Status: DC | PRN
  Administered 2015-03-18: 300 mg via ORAL

## 2015-03-18 MED ORDER — MORPHINE SULFATE (PF) 2 MG/ML IV SOLN
INTRAVENOUS | Status: AC
  Filled 2015-03-18: qty 1

## 2015-03-18 MED ORDER — IOHEXOL 350 MG/ML SOLN
INTRAVENOUS | Status: DC | PRN
  Administered 2015-03-18: 125 mL via INTRAVENOUS
  Administered 2015-03-18 (×2): 50 mL via INTRAVENOUS

## 2015-03-18 MED ORDER — BIVALIRUDIN BOLUS VIA INFUSION - CUPID
INTRAVENOUS | Status: DC | PRN
  Administered 2015-03-18 (×2): 67.425 mg via INTRAVENOUS

## 2015-03-18 MED ORDER — CLOPIDOGREL BISULFATE 75 MG PO TABS
75.0000 mg | ORAL_TABLET | Freq: Every day | ORAL | Status: DC
  Administered 2015-03-19: 75 mg via ORAL
  Filled 2015-03-18: qty 1

## 2015-03-18 MED ORDER — LIDOCAINE HCL (PF) 1 % IJ SOLN
INTRAMUSCULAR | Status: DC | PRN
  Administered 2015-03-18: 15 mL via INTRADERMAL

## 2015-03-18 MED ORDER — MORPHINE SULFATE (PF) 2 MG/ML IV SOLN
2.0000 mg | INTRAVENOUS | Status: DC | PRN
  Administered 2015-03-18 (×3): 2 mg via INTRAVENOUS
  Filled 2015-03-18 (×3): qty 1

## 2015-03-18 MED ORDER — HYDRALAZINE HCL 20 MG/ML IJ SOLN
10.0000 mg | Freq: Once | INTRAMUSCULAR | Status: DC
  Filled 2015-03-18: qty 1

## 2015-03-18 MED ORDER — SODIUM CHLORIDE 0.9 % WEIGHT BASED INFUSION
3.0000 mL/kg/h | INTRAVENOUS | Status: AC
Start: 2015-03-18 — End: 2015-03-18
  Administered 2015-03-18: 11:00:00 3 mL/kg/h via INTRAVENOUS

## 2015-03-18 MED ORDER — NITROGLYCERIN 0.4 MG SL SUBL
SUBLINGUAL_TABLET | SUBLINGUAL | Status: AC
  Filled 2015-03-18: qty 1

## 2015-03-18 MED ORDER — SODIUM CHLORIDE 0.9 % IV SOLN
250.0000 mg | INTRAVENOUS | Status: DC | PRN
  Administered 2015-03-18 (×2): 1.75 mg/kg/h via INTRAVENOUS

## 2015-03-18 MED ORDER — SODIUM CHLORIDE 0.9 % IJ SOLN: 3.0000 mL | INTRAMUSCULAR | Status: DC | PRN

## 2015-03-18 MED ORDER — SODIUM CHLORIDE 0.9 % IV SOLN: 250.0000 mL | INTRAVENOUS | Status: DC | PRN

## 2015-03-18 MED ORDER — FUROSEMIDE 10 MG/ML IJ SOLN
60.0000 mg | Freq: Once | INTRAMUSCULAR | Status: AC
  Administered 2015-03-18: 19:00:00 60 mg via INTRAVENOUS
  Filled 2015-03-18: qty 6

## 2015-03-18 MED ORDER — SODIUM CHLORIDE 0.9 % IJ SOLN: 3.0000 mL | Freq: Two times a day (BID) | INTRAMUSCULAR | Status: DC

## 2015-03-18 SURGICAL SUPPLY — 24 items
BALLN EUPHORA RX 2.5X12 (BALLOONS) ×2
BALLN EUPHORA RX 3.5X20 (BALLOONS) ×2
BALLN ~~LOC~~ EUPHORA RX 4.5X20 (BALLOONS) ×2
BALLOON EUPHORA RX 2.5X12 (BALLOONS) IMPLANT
BALLOON EUPHORA RX 3.5X20 (BALLOONS) IMPLANT
BALLOON ~~LOC~~ EUPHORA RX 4.5X20 (BALLOONS) IMPLANT
CATH INFINITI 5 FR RCB (CATHETERS) ×1 IMPLANT
CATH INFINITI 5FR AL1 (CATHETERS) ×1 IMPLANT
CATH INFINITI 5FR JL5 (CATHETERS) ×1 IMPLANT
CATH INFINITI 5FR MULTPACK ANG (CATHETERS) ×1 IMPLANT
DEVICE SPIDERFX EMB PROT 5MM (WIRE) ×2 IMPLANT
FILTERWIRE EZ 3.5-5.5 190CM (WIRE) ×1 IMPLANT
GUIDE CATH RUNWAY 6FR AL 1 (CATHETERS) ×1 IMPLANT
KIT ENCORE 26 ADVANTAGE (KITS) ×1 IMPLANT
KIT HEART LEFT (KITS) ×2 IMPLANT
PACK CARDIAC CATHETERIZATION (CUSTOM PROCEDURE TRAY) ×2 IMPLANT
SHEATH PINNACLE 5F 10CM (SHEATH) ×1 IMPLANT
SHEATH PINNACLE 6F 10CM (SHEATH) ×1 IMPLANT
STENT SYNERGY DES 4X38 (Permanent Stent) ×1 IMPLANT
TRANSDUCER W/STOPCOCK (MISCELLANEOUS) ×2 IMPLANT
TUBING CIL FLEX 10 FLL-RA (TUBING) ×1 IMPLANT
WIRE ASAHI PROWATER 180CM (WIRE) ×1 IMPLANT
WIRE COUGAR XT STRL 190CM (WIRE) ×1 IMPLANT
WIRE EMERALD 3MM-J .035X150CM (WIRE) ×1 IMPLANT

## 2015-03-18 NOTE — Progress Notes (Signed)
Site area: right groin  Site Prior to Removal:  Level 0  Pressure Applied For 20 MINUTES    Minutes Beginning at 1410  Manual:   Yes.    Patient Status During Pull:  stable  Post Pull Groin Site:  Level 0  Post Pull Instructions Given:  Yes.    Post Pull Pulses Present:  Yes.    Dressing Applied:  Yes.    Comments:

## 2015-03-18 NOTE — Progress Notes (Signed)
Asked by overnight fellow to check on 2W28 since he still having intermittent chest pain. On arrival, patient was having 7/10 chest pain. EKG was repeated. Still shows ST depression in lateral leads and ST elevation in aVR but more noticeable now. I have discussed with patient and cath lab, he has been moved to 1st case today at 7:30AM.  Signed, Almyra Deforest PA Pager: [phone]

## 2015-03-18 NOTE — Interval H&P Note (Signed)
History and Physical Interval Note:  03/18/2015 7:41 AM   William Elliott  has presented today for  Procedure(s): Left Heart Cath and Cors/Grafts Angiography (N/A) +/- PCI as a surgical intervention .    He presents with: Principal Problem:   Unstable angina [facility]) Active Problems:   Coronary artery disease involving native coronary artery with unstable angina pectoris ([place])   Ischemic heart disease   Hyperlipidemia   Essential hypertension  He is here for Urgent Cath due to recurrent CP after being admitted no 1/15.  Cath Lab Visit (complete for each Cath Lab visit)  Clinical Evaluation Leading to the Procedure:   ACS: Yes.    Non-ACS:    Anginal Classification: CCS IV  Anti-ischemic medical therapy: Maximal Therapy (2 or more classes of medications)  Non-Invasive Test Results: No non-invasive testing performed  Prior CABG: Previous CABG  TIMI SCORE  Patient Information:  TIMI Score is 4  UA/NSTEMI and intermediate-risk features (e.g., TIMI score 3?4) for short-term risk of death or nonfatal MI  Revascularization of the presumed culprit artery   A (8)  Indication: 10; Score: 8   HARDING, DAVID W

## 2015-03-18 NOTE — Progress Notes (Signed)
Achava.Bowens- MD paged. Pt complaining of recurrent chest pain. Orders given to apply nitro paste and draw troponins. Will continue to monitor closely.  Raliegh Ip RN

## 2015-03-18 NOTE — H&P (View-Only) (Signed)
Cardiology Office Note    Date:  03/13/2015   ID:  VAL DENIGRIS, DOB [birth year], MRN [id number]  PCP:  [name], MD  Cardiologist:  Dr. Darlin Coco >> Dr. [name] [facility])  Electrophysiologist:  n/a  Chief Complaint  Patient presents with  . Follow-up    pt has chest pain and has needed to take nitro pills  . Coronary Artery Disease    History of Present Illness:  William Elliott is a 74 y.o. male with a hx of CAD s/p CABG in 1999, carotid stenosis, ED, HL, HTN, COPD/asthma.  His notes indicate he had an echo in 2008 with normal LVF, mod LVH, diastolic dysfunction, mild aortic sclerosis, mild MR.  Last seen by Dr. Darlin Coco 10/16.  Metoprolol had previously been changed to Bisoprolol by Dr. Melvyn Novas (pulmonology).  He was noted to be bradycardic and had 1st degree AVB on his ECG.  Bisoprolol dose was reduced.  Four month FU was recommended.  He called in recently with complaints of chest pain.  He was asked to FU with Dr. Melvyn Novas regarding his pulmonary medications and to start on a PPI.  FU was arranged today.  The patient notes substernal chest discomfort over the past 3 weeks. This typically occurs when he lays down and has awoken him from sleep. It has been quite severe with radiation up into his jaw and teeth and down both arms. He denies associated dyspnea, nausea or diaphoresis. Symptoms are similar to previous angina. He has not had anginal symptoms since his bypass surgery. He has not taken nitroglycerin since his bypass surgery until last night. His symptoms were quite severe last night. His chest pain completely resolved with 2 nitroglycerin. He denies syncope, orthopnea, PND or edema. He denies dyspepsia, dysphagia, odynophagia, vomiting, diarrhea.   Past Medical History  Diagnosis Date  . Coronary artery disease     CABG - 1999  . Hyperlipidemia   . Carotid bruit     Asymptomatic left carotid bruit  . Ischemic heart disease   . Erectile dysfunction   .  Dyslipidemia   . COPD (chronic obstructive pulmonary disease) ([place])   . History of Doppler ultrasound 04/13/2007    1. 20-39% stenosis noted in bilateral internal carotid arteries. 1. 20-39% stenosis noted in bilateral internal carotid arteries. distal common carotid artery.  3. Antegrade bilateral vertebral arteries.    Past Surgical History  Procedure Laterality Date  . Coronary artery bypass graft  1999  . Leg surgery  1995     left leg [facility]  . Cardiac catheterization      Current Outpatient Prescriptions  Medication Sig Dispense Refill  . Albuterol Sulfate 108 (90 BASE) MCG/ACT AEPB Inhale 2 puffs into the lungs 4 (four) times daily as needed (wheezing and sob).    . ALPRAZolam (XANAX) 1 MG tablet TAKE 1 TABLET BY MOUTH FOUR TIMES DAILY 120 tablet 2  . amLODipine (NORVASC) 5 MG tablet TAKE 1 TABLET BY MOUTH EVERY DAY 30 tablet 10  . aspirin 81 MG tablet Take 81 mg by mouth daily.    Marland Kitchen b complex vitamins tablet Take 1 tablet by mouth daily.    . Cholecalciferol 5000 UNITS capsule Take 5,000 Units by mouth daily.    . fluticasone (FLONASE) 50 MCG/ACT nasal spray USE 2 SPRAYS INTO EACH NOSTRIL DAILY. 16 g 2  . hydrochlorothiazide (HYDRODIURIL) 25 MG tablet TAKE 1 TABLET BY MOUTH EVERY DAY. 30 tablet 10  . losartan (  COZAAR) 100 MG tablet Take 0.5 tablets (50 mg total) by mouth daily.    . multivitamin (THERAGRAN) per tablet Take 1 tablet by mouth daily.      . nitroGLYCERIN (NITROSTAT) 0.4 MG SL tablet Place 1 tablet (0.4 mg total) under the tongue every 5 (five) minutes as needed for chest pain. 90 tablet 3  . omega-3 acid ethyl esters (LOVAZA) 1 G capsule Take 2 g by mouth 2 (two) times daily as needed (for blood thinner).    Marland Kitchen omeprazole (PRILOSEC OTC) 20 MG tablet Take 1 tablet (20 mg total) by mouth daily.    . potassium chloride SA (K-DUR,KLOR-CON) 20 MEQ tablet TAKE 1 TABLET BY MOUTH TWICE DAILY 180 tablet 3  . sildenafil (VIAGRA) 100 MG tablet Take 100 mg by mouth  daily as needed (for erectile dysfunction).     . Tiotropium Bromide-Olodaterol (STIOLTO RESPIMAT) 2.5-2.5 MCG/ACT AERS Inhale 2 puffs into the lungs daily. 1 Inhaler 11  . Umeclidinium-Vilanterol 62.5-25 MCG/INH AEPB Inhale 1 puff into the lungs daily. 1 each 6  . vitamin C (ASCORBIC ACID) 500 MG tablet Take 500 mg by mouth daily.    Marland Kitchen ZETIA 10 MG tablet TAKE 1 TABLET BY MOUTH EVERY DAY 30 tablet 10  . bisoprolol (ZEBETA) 5 MG tablet Take 0.5 tablets (2.5 mg total) by mouth daily.    . isosorbide mononitrate (IMDUR) 30 MG 24 hr tablet Take 1 tablet (30 mg total) by mouth daily. 30 tablet 11   No current facility-administered medications for this visit.    Allergies:   Fenofibrate; Lipitor; Niaspan; [place]; and Zocor   Social History   Social History  . Marital Status: Married    Spouse Name: N/A  . Number of Children: N/A  . Years of Education: N/A   Occupational History  . Welder x 10 yrs     Social History Main Topics  . Smoking status: Former Smoker    Types: Cigars    Quit date: 03/02/1997  . Smokeless tobacco: None     Comment: smoked cigars x 10 yrs, never smoked cigs  . Alcohol Use: No  . Drug Use: No  . Sexual Activity: Not Asked   Other Topics Concern  . None   Social History Narrative     Family History:  The patient's family history includes Heart attack in his father; Heart disease in his father; Hypertension in his mother; Stroke in his mother.   ROS:   Please see the history of present illness.    Review of Systems  HENT: Positive for hearing loss.   Cardiovascular: Positive for chest pain and dyspnea on exertion.  Musculoskeletal: Positive for back pain and joint pain.  Psychiatric/Behavioral: Positive for depression. The patient is nervous/anxious.   All other systems reviewed and are negative.   PHYSICAL EXAM:   VS:  BP 102/60 mmHg  Pulse 55  Ht 5\' 10"  (1.778 m)  Wt 209 lb 12.8 oz (95.165 kg)  BMI 30.10 kg/m2  SpO2 99%   GEN: Well  nourished, well developed, in no acute distress HEENT: normal Neck: no JVD, no masses Cardiac: Normal S1/S2, RRR; [id number] systolic murmur LLSB,    Respiratory:  clear to auscultation bilaterally; no wheezing, rhonchi or rales GI: soft, nontender, nondistended, + BS MS: no deformity or atrophy Skin: warm and dry, no rash Neuro:   no focal deficits  Psych: Alert and oriented x 3, normal affect  Wt Readings from Last 3 Encounters:  03/13/15 209 lb 12.8  oz (95.165 kg)  12/14/14 211 lb (95.709 kg)  08/09/14 205 lb 1.9 oz (93.042 kg)      Studies/Labs Reviewed:   EKG:  EKG is  ordered today.  The ekg ordered today demonstrates sinus bradycardia, HR 52, normal axis, diffuse ST depression, first-degree AV block (PR 324 ms), QTc 427 ms  Recent Labs: 12/14/2014: ALT 29; BUN 15; Creat 1.05; Potassium 4.7; Sodium 137   Recent Lipid Panel    Component Value Date/Time   CHOL 176 12/14/2014 1504   TRIG 133 12/14/2014 1504   HDL 32* 12/14/2014 1504   CHOLHDL 5.5* 12/14/2014 1504   VLDL 27 12/14/2014 1504   LDLCALC 117 12/14/2014 1504   LDLDIRECT 117.0 04/20/2014 1303    Additional studies/ records that were reviewed today include:   Carotid US 04/2007 Bilateral ICA 20-39%    ASSESSMENT:    1. Coronary artery disease involving native coronary artery of native heart with unstable angina pectoris ([place])   2. Benign hypertensive heart disease without heart failure   3. Hyperlipidemia   4. Chronic obstructive pulmonary disease, unspecified COPD type ([place])   5. Carotid stenosis, bilateral   6. Bradycardia   7. Murmur     PLAN:  In order of problems listed above:  1. CAD - s/p remote CABG.  He presents today with complaints of worsening chest discomfort similar to previous angina. He is mainly having nocturnal symptoms. He has some subtle ST depression noted on ECG which appears to be fairly new. He is not currently having chest symptoms. I have recommended proceeding with cardiac  catheterization to further evaluate his symptoms. I reviewed this with Dr. Rayann Heman (DOD) who agreed. Risks and benefits of cardiac catheterization have been discussed with the patient.  These include bleeding, infection, kidney damage, stroke, heart attack, death.  The patient understands these risks and is willing to proceed.   -  Arrange LHC within the next week  -  Start isosorbide mononitrate 30 mg daily  -  Obtain echocardiogram given systolic murmur  -  Patient advised to go to the ED if he has recurrent or worsening symptoms  2. HTN - Blood pressure would not likely tolerate isosorbide without adjusting other medications. Therefore, I have recommended decreasing Losartan to 50 mg QD.  3. HL - Statin intolerant.  Continue Zetia.  4. COPD - He is asking for a refill on one of the inhalers given to him by Pulmonology.  I have asked him to contact Dr. Melvyn Novas for a refill.    5. Carotid stenosis - hx of asymptomatic L carotid bruit and mild ICA stenosis by carotid US in 2009.  Continue ASA.    6. Bradycardia - Improved. Unfortunately, the pharmacy gave him a higher dose of Bisoprolol when he last filled his medication.  I have reminded him to take Bisoprolol 2.5 mg only (1/2 tab of 5 mg).  With his 1st degree AVB, we may need to DC his beta-blocker altogether, eventually.  I have asked him to take the Bisoprolol 10 mg tabs back to the pharmacy so they can correct the mistake.   7. Murmur - Get Echo.     Medication Adjustments/Labs and Tests Ordered: Current medicines are reviewed at length with the patient today.  Concerns regarding medicines are outlined above.  Medication changes, Labs and Tests ordered today are outlined in the Patient Instructions noted below.  Signed, Richardson Dopp, PA-C  03/13/2015 4:35 PM    [id number]  [address], [place], [place]  [address] Phone: [phone]; Fax: [phone]    Patient Instructions  Medication Instructions:  1.  DECREASE LOSARTAN TO 50 MG DAILY (THIS WILL BE 1/2 TAB OF THE 100 MG TABLET)  2. CONTINUE ON THE BISOPROLOL 5 MG TABLET WITH THE DIRECTIONS TO TAKE 1/2 TABLET = 2.5 MG DAILY  3. START IMDUR 30 MG DAILY; RX SENT IN  4. YOU SHOULD BRING THE BOTTLE OF BISOPROLOL 10 MG TABLET BACK TO THE PHARMACY AND ASK FOR THEM TO PLEASE GIVE YOU THE CORRECT DOSE OF 5 MG TABLET WITH DIRECTIONS TO TAKE 1/2 TABLET DAILY = 2.5 MG DAILY  Labwork: TODAY BMET, CBC W/DIFF, PT/INR  Testing/Procedures: Your physician has requested that you have an echocardiogram THIS IS TO BE DONE AT [facility]; PER SCOTT WEAVER, PAC TO HAVE DONE EITHER 1/12 OR 1/13 SO THAT IT WILL BE DONE BEFORE CATH ON 1/16. Echocardiography is a painless test that uses sound waves to create images of your heart. It provides your doctor with information about the size and shape of your heart and how well your heart's chambers and valves are working. This procedure takes approximately one hour. There are no restrictions for this procedure.  Your physician has requested that you have a cardiac catheterization. Cardiac catheterization is used to diagnose and/or treat various heart conditions. Doctors may recommend this procedure for a number of different reasons. The most common reason is to evaluate chest pain. Chest pain can be a symptom of coronary artery disease (CAD), and cardiac catheterization can show whether plaque is narrowing or blocking your heart's arteries. This procedure is also used to evaluate the valves, as well as measure the blood flow and oxygen levels in different parts of your heart. For further information please visit [email or web address]. Please follow instruction sheet, as given.   Follow-Up: 04/01/15 @ 3:20 WITH SCOTT WEAVER, PAC   Any Other Special Instructions Will Be Listed Below (If Applicable).   If you need a refill on your cardiac medications before your next appointment, please call your pharmacy.     Discussed with  Richardson Dopp PAC today William Grayer MD, [facility] 03/13/2015 4:44 PM

## 2015-03-18 NOTE — Progress Notes (Signed)
0502- Pt complaining of 6/10 chest pain. Vials and EKG taken. 2L O2 applied. Pain relieved by SL nitro x2.  0532- MD notified of chest pain and pt is in 1st degree heart block. Orders given to hold scheduled nitro paste. Will continue to monitor closely.  Raliegh Ip RN

## 2015-03-18 NOTE — Interval H&P Note (Signed)
History and Physical Interval Note:  03/18/2015 7:50 AM  William Elliott  Has h/o CABG in 1999.  CABG report obtained prior to Access: LIMA-LAD, SVG-DIAG, SVG-OM, SVG-RPDA. 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  HARDING, DAVID W

## 2015-03-18 NOTE — Interval H&P Note (Signed)
History and Physical Interval Note:  03/18/2015 7:23 AM  William Elliott  has presented today for surgery, with the diagnosis of unstable angina - h/o CABG.   The various methods of treatment have been discussed with the patient and family. After consideration of risks, benefits and other options for treatment, the patient has consented to  Procedure(s): Left Heart Cath and Cors/Grafts Angiography (N/A) with possible percutaneous coronary intervention as a surgical intervention .  The patient's history has been reviewed, patient examined, -- he has been having progressively worsening Unstable Angina.  He presents for urgent Cardiac Cath +/- PCI.  I have reviewed the patient's chart and labs.  Questions were answered to the patient's satisfaction.    He was seen by Mr. Kathlen Mody on 1/11 with plan for OP cath today.  Unfortunately,  Because of accelerated chest pain requiring sublingual intervention was asked to come to the hospital today for admission on 1/15.  Initially upon evaluation by Dr. Gwenlyn Found on 1/15, he was CP free. However, he had recurrence of CP overnight & has been moved up to first case for this AM.  He has h/o CABG in 1999, but now report is available for graft anatomy - we are in the process of obtaining his CABG report.  He is currently having 6/10 CP - was 7/10 when seen by out PA this AM.   William Elliott

## 2015-03-18 NOTE — Progress Notes (Signed)
Subjective: Pt denies SOB  NO CP  Feels good at rest   Objective: Filed Vitals:   03/18/15 1100 03/18/15 1130 03/18/15 1145 03/18/15 1200  BP: 139/71 160/65 150/78 128/64  Pulse: 49 48 51 50  Temp:    98 F (36.7 C)  TempSrc:    Oral  Resp: 14 16 17 15   Height:      Weight:      SpO2: 94% 90% 92% 88%   Weight change:  No intake or output data in the 24 hours ending 03/18/15 1410  General: Alert, awake, oriented x3, in no acute distress Neck:  Neck is full JVP appears elevated   Heart: Regular rate and rhythm, gr II/VI systolic murmur LSB  , rubs, gallops.  Lungs: Clear to auscultation.  No rales or wheezes. Exemities:  No edema.   Neuro: Grossly intact, nonfocal.  Tele  SR    Lab Results: Results for orders placed or performed during the hospital encounter of 03/17/15 (from the past 24 hour(s))  Basic metabolic panel     Status: Abnormal   Collection Time: 03/17/15  3:08 PM  Result Value Ref Range   Sodium 138 135 - 145 mmol/L   Potassium 3.7 3.5 - 5.1 mmol/L   Chloride 100 (L) 101 - 111 mmol/L   CO2 26 22 - 32 mmol/L   Glucose, Bld 106 (H) 65 - 99 mg/dL   BUN 12 6 - 20 mg/dL   Creatinine, Ser 0.97 0.61 - 1.24 mg/dL   Calcium 9.5 8.9 - 10.3 mg/dL   GFR calc non Af Amer >60 >60 mL/min   GFR calc Af Amer >60 >60 mL/min   Anion gap 12 5 - 15  CBC     Status: None   Collection Time: 03/17/15  3:08 PM  Result Value Ref Range   WBC 5.5 4.0 - 10.5 K/uL   RBC 4.22 4.22 - 5.81 MIL/uL   Hemoglobin 13.6 13.0 - 17.0 g/dL   HCT 39.8 39.0 - 52.0 %   MCV 94.3 78.0 - 100.0 fL   MCH 32.2 26.0 - 34.0 pg   MCHC 34.2 30.0 - 36.0 g/dL   RDW 12.9 11.5 - 15.5 %   Platelets 231 150 - 400 K/uL  I-stat troponin, ED (not at [facility], [facility])     Status: Abnormal   Collection Time: 03/17/15  3:16 PM  Result Value Ref Range   Troponin i, poc 0.09 (HH) 0.00 - 0.08 ng/mL   Comment NOTIFIED PHYSICIAN    Comment 3          POCT Activated clotting time     Status: None   Collection Time:  03/18/15  8:48 AM  Result Value Ref Range   Activated Clotting Time 214 seconds  POCT Activated clotting time     Status: None   Collection Time: 03/18/15  8:55 AM  Result Value Ref Range   Activated Clotting Time 260 seconds  POCT Activated clotting time     Status: None   Collection Time: 03/18/15  9:09 AM  Result Value Ref Range   Activated Clotting Time 451 seconds  Troponin I (q 6hr x 3)     Status: Abnormal   Collection Time: 03/18/15 11:05 AM  Result Value Ref Range   Troponin I 1.53 (HH) <0.031 ng/mL    Studies/Results: Dg Chest Port 1 View  03/17/2015  CLINICAL DATA:  LEFT chest pain radiating to arms, back and jaw for 1 week, diarrhea for 4-5  days, asthma, COPD, hypertension, coronary artery disease post CABG EXAM: PORTABLE CHEST 1 VIEW COMPARISON:  Portable exam 1554 hours compared to 05/26/2003 FINDINGS: Borderline enlargement of cardiac silhouette post CABG. Atherosclerotic calcification aorta. Slight pulmonary vascular congestion. Mild atelectasis versus infiltrate LEFT lower lobe, new. Remaining lungs clear. No pleural effusion or pneumothorax. Bones demineralized. IMPRESSION: Borderline enlargement of cardiac silhouette with vascular congestion post CABG. Atelectasis versus consolidation LEFT lower lobe. Electronically Signed   By: Lavonia Dana M.D.   On: 03/17/2015 16:16    Medications: Reviewed    @PROBHOSP @  1  CAD  Intervention today  Feeling better  Note LVEDP was elevated at 36   Will give IV lasix later today  BEMT in AM  2.  HL  Continue Zetia  Statin intolerant  3  Hx bradycardia  Follow     LOS: 1 day   Dorris Carnes 03/18/2015, 2:10 PM

## 2015-03-18 NOTE — Progress Notes (Signed)
   03/18/15 1600  Clinical Encounter Type  Visited With Patient  Visit Type Initial;Psychological support;Spiritual support;Social support  Referral From Care management  Consult/Referral To Chaplain  Spiritual Encounters  Spiritual Needs Emotional    Chaplain stopped by to visit Pt. However Pt. Said he "wasn't in the mood to have a visit"  If Pt. Changes his mind please page the [employer],

## 2015-03-18 NOTE — Progress Notes (Signed)
MD paged. Pain unrelieved. New orders given. Medication administered. Will continue to monitor closely.  Raliegh Ip RN

## 2015-03-18 NOTE — Progress Notes (Signed)
UR COMPLETED  

## 2015-03-19 ENCOUNTER — Encounter (HOSPITAL_COMMUNITY): Payer: Self-pay | Admitting: Student

## 2015-03-19 ENCOUNTER — Other Ambulatory Visit: Payer: Self-pay

## 2015-03-19 ENCOUNTER — Telehealth: Payer: Self-pay | Admitting: Physician Assistant

## 2015-03-19 DIAGNOSIS — I214 Non-ST elevation (NSTEMI) myocardial infarction: Principal | ICD-10-CM

## 2015-03-19 DIAGNOSIS — I10 Essential (primary) hypertension: Secondary | ICD-10-CM

## 2015-03-19 LAB — CBC
HCT: 41.4 % (ref 39.0–52.0)
Hemoglobin: 14.6 g/dL (ref 13.0–17.0)
MCH: 32.9 pg (ref 26.0–34.0)
MCHC: 35.3 g/dL (ref 30.0–36.0)
MCV: 93.2 fL (ref 78.0–100.0)
Platelets: 243 10*3/uL (ref 150–400)
RBC: 4.44 MIL/uL (ref 4.22–5.81)
RDW: 13 % (ref 11.5–15.5)
WBC: 8 10*3/uL (ref 4.0–10.5)

## 2015-03-19 LAB — BASIC METABOLIC PANEL
Anion gap: 12 (ref 5–15)
BUN: 11 mg/dL (ref 6–20)
CO2: 22 mmol/L (ref 22–32)
Calcium: 9.1 mg/dL (ref 8.9–10.3)
Chloride: 103 mmol/L (ref 101–111)
Creatinine, Ser: 1.06 mg/dL (ref 0.61–1.24)
GFR calc Af Amer: >60 mL/min (ref >60)
GFR calc non Af Amer: >60 mL/min (ref >60)
Glucose, Bld: 104 mg/dL — ABNORMAL HIGH (ref 65–99)
Potassium: 3.6 mmol/L (ref 3.5–5.1)
Sodium: 137 mmol/L (ref 135–145)

## 2015-03-19 LAB — TROPONIN I
Troponin I: 5.2 ng/mL (ref <0.031)
Troponin I: 2.58 ng/mL (ref <0.031)

## 2015-03-19 LAB — PLATELET INHIBITION P2Y12: Platelet Function  P2Y12: 142 [PRU] — ABNORMAL LOW (ref 194–418)

## 2015-03-19 MED ORDER — NITROGLYCERIN 0.4 MG SL SUBL: 0.4000 mg | SUBLINGUAL_TABLET | SUBLINGUAL | Status: DC | PRN

## 2015-03-19 MED ORDER — CLOPIDOGREL BISULFATE 75 MG PO TABS: 75.0000 mg | ORAL_TABLET | Freq: Every day | ORAL | Status: DC

## 2015-03-19 MED ORDER — PANTOPRAZOLE SODIUM 40 MG PO TBEC: 40.0000 mg | DELAYED_RELEASE_TABLET | Freq: Every day | ORAL | Status: DC

## 2015-03-19 MED ORDER — ANGIOPLASTY BOOK
Freq: Once | Status: AC
  Administered 2015-03-19: 11:00:00
  Filled 2015-03-19: qty 1

## 2015-03-19 MED ORDER — HEART ATTACK BOUNCING BOOK
Freq: Once | Status: DC
  Filled 2015-03-19: qty 1

## 2015-03-19 NOTE — Discharge Instructions (Addendum)
° °                    STOP TAKING PRILOSEC (OMEPRAZOLE)

## 2015-03-19 NOTE — Progress Notes (Signed)
CARDIAC REHAB PHASE I   PRE:  Rate/Rhythm: 58 SB   BP:  Supine: 120/66  Sitting:   Standing:    SaO2:   MODE:  Ambulation: 800 ft   POST:  Rate/Rhythm: 64 SR  BP:  Supine:   Sitting: 118/65  Standing:    SaO2:  0750-0900 Pt walked 800 ft with steady gait. No CP. Tolerated well. MI education completed with pt who voiced understanding. Stressed importance of plavix with stent. Pt had taken 2 NTG at a time at home. Reviewed correct way to take NTG and that it can really lower BP if not taken correctly. Reviewed MI restrictions, ex ed and diet. Pt has been trying to follow heart healthy diet. Pt is under a lot of stress worrying about son and grandson who are homeless. Emotional support given. Discussed CRP 2 and referring to [facility] program.    William Good, RN BSN  03/19/2015 8:54 AM

## 2015-03-19 NOTE — Telephone Encounter (Signed)
New problem   Pt has TCM w/scott per [place] calling.

## 2015-03-19 NOTE — Discharge Summary (Signed)
Discharge Summary    Patient ID: William Elliott,  MRN: [id number], DOB/AGE: 07-12-1940 75 y.o.  Admit date: 03/17/2015 Discharge date: 03/19/2015  Primary Care Provider: [name] Primary Cardiologist: Dr. 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  The patient was seen in the office by Richardson Dopp PA-C on 03/13/2015 and reported having substernal chest discomfort for the past 3 weeks. The pain would radiate up into his jaw and down both arms, similar to his previous anginal episodes. A cardiac catheterization was recommended within the following week and he agreed to proceed.  Hospital Course     Consultants: None  His cardiac catheterization was scheduled on 03/18/2015 but he presented to [facility] ED on 03/17/2015 for chest pain that was radiating to his back and jaw. After administration of ASA and SL NTG, his chest pain resolved. His initial troponin was elevated to 0.09 and his EKG showed nonspecific ST changes. He was admitted to telemetry until his cath the following day.  On the morning of 03/18/2015, he reported having recurrent intermittent chest pain and his EKG showed ST depression in the lateral leads and slight ST elevation in aVR. He was therefore moved to the first case of the day.  His catheterization was performed by Dr. Ellyn Hack and his cath showed overall severe native coronary  artery disease with occluded native mid LAD, proximal circumflex and mid RCA. The LIMA-LAD was patent, but 2 out of 3 vein grafts were 100% occluded (SVG-RCA and SVG-diagonal). There was 99% proximal stenosis of SVG-OM treated with a DES stent. He reported a history of a GI bleed 8+ years ago but this was due to a Mallory-Weiss tear. This was discussed with Dr. Mare Ferrari and he did not think bleeding would be a issue. He will need to be on ASA and Plavix for a minimum of 6 months, preferably one year. At the time of cath, his LVEDP was elevated to 36. Following the procedure, he was given IV Lasix 60mg  for one dose with an output of -1.5L.   On 03/19/2015, he denied any chest pain or shortness of breath overnight. He expressed concerns about taking Plavix with his history of a GI bleed 8+ years ago. He denied any recent signs or symptoms of bleeding. He was informed to contact the office if he does develop evidence of bleeding. He was educated on the importance of ASA and Plavix with his recently placed DES. His troponin values continued to be obtained and peaked at 6.06, trending down to 2.58 on the day of discharge.  His HR was stable in the mid-50's to 60's overnight. He has a history of bradycardia and COPD, therefore his BB was not titrated at this time. He ambulated over 800 ft with cardiac rehab without difficulty. He has been referred to [facility] for CRP 2.   The patient was last examined by Dr. Harrington Challenger and deemed  stable for discharge. He will have close Cardiology follow-up on 03/25/2015.    Discharge Vitals Blood pressure 127/72, pulse 59, temperature 97 F (36.1 C), temperature source Oral, resp. rate 20, height 5\' 10"  (1.778 m), weight 199 lb 4.7 oz (90.4 kg), SpO2 97 %.  Filed Weights   03/17/15 1741 03/18/15 0513 03/19/15 0515  Weight: 199 lb 11.8 oz (90.6 kg) 198 lb 3.1 oz (89.9 kg) 199 lb 4.7 oz (90.4 kg)    Labs & Radiologic Studies    CBC  Recent Labs  03/17/15 1508  03/19/15 1008  WBC 5.5 8.0  HGB 13.6 14.6  HCT 39.8 41.4  MCV 94.3 93.2  PLT 231 0000000   Basic Metabolic Panel  Recent Labs  03/17/15 1508 03/19/15 0651  NA 138 137  K 3.7 3.6  CL 100* 103  CO2 26 22  GLUCOSE 106* 104*  BUN 12 11  CREATININE 0.97 1.06  CALCIUM 9.5 9.1   Cardiac Enzymes  Recent Labs  03/18/15 1845 03/19/15 0005 03/19/15 0651  TROPONINI 6.06* 5.20* 2.58*    Dg Chest Port 1 View: 03/17/2015  CLINICAL DATA:  LEFT chest pain radiating to arms, back and jaw for 1 week, diarrhea for 4-5 days, asthma, COPD, hypertension, coronary artery disease post CABG EXAM: PORTABLE CHEST 1 VIEW COMPARISON:  Portable exam 1554 hours compared to 05/26/2003 FINDINGS: Borderline enlargement of cardiac silhouette post CABG. Atherosclerotic calcification aorta. Slight pulmonary vascular congestion. Mild atelectasis versus infiltrate LEFT lower lobe, new. Remaining lungs clear. No pleural effusion or pneumothorax. Bones demineralized. IMPRESSION: Borderline enlargement of cardiac silhouette with vascular congestion post CABG. Atelectasis versus consolidation LEFT lower lobe. Electronically Signed   By: Lavonia Dana M.D.   On: 03/17/2015 16:16    Diagnostic Studies/Procedures    Cardiac Catheterization:  1. Prox LAD to Mid LAD lesion, 95% stenosed. Mid LAD lesion, 100% stenosed. Ost 2nd Diag to 2nd Diag lesion, 80% stenosed. 2. Ost Cx to Prox Cx lesion, 100% stenosed. 3. Mid RCA to Dist RCA lesion, 100% stenosed. Post Atrio lesion, 100% stenosed. 4. LIMA was injected is large. JR4 Catheter used 5. SVG-RCA & SVG-DIAG 100% occluded 6. Prox Graft lesion SVG-OM, 99% stenosed. Very large - TIMI 1 flow to graft insertion, but no flow in the OM. 7. Following PCI of SVG-OM with distal protection device using Synergy DES, Post intervention, there is a 0% residual stenosis. 8. Mid Cx to Dist Cx lesion, 65% stenosed - not identified until post PCI   Overall severe native coronary artery  disease with occluded native mid LAD, proximal circumflex and mid RCA. Patent LIMA-LAD, but 2 of 3 vein grafts 100% occluded (SVG-RCA and SVG-diagonal). - 99% proximal stenosis of SVG-OM treated with DES stent. Initially there was concern basilar patient having history of Mallory-Weiss tear with severe GI bleed 8 years ago. I confirmed with Dr. Mare Ferrari, this should not be an issue being so far distant to that event. The plan is to use Plavix, but based on the length of the lesion, I felt it best to be using a drug-eluting stent Relatively difficult procedure due to the extent of thrombus and debris in the vessel with TIMI 1 flow pre-PCI.  Overall successful PCI of the SVG-OM1 and TIMI-3 flow.  Plan:  Monitor overnight in the postprocedure unit on [address].  Sheath removal with manual pressure for hemostasis  I would continue Plavix for minimum of 6 months, preferably one year. Would likely be fine stopping Plavix after 3 months.  Continue other cardiac treatment. He has a severely elevated LVEDP and may require diuresis prior to discharge.  No family Members present to discuss results upon completion of the procedure.   Disposition   Pt is being discharged home today in good condition.  Follow-up Plans & Appointments    Follow-up Information    Follow up with Richardson Dopp, PA-C On 03/25/2015.   Specialties:  Physician Assistant, Radiology, Interventional Cardiology   Why:  Cardiology Hospital Follow-Up on 03/25/2015 at 12:10PM.   Contact information:   [address] [place] [address] [phone]      Discharge Instructions    AMB Referral to Phase II Cardiac Rehab    Complete by:  As directed   Diagnosis:  Myocardial Infarction     Amb Referral to Cardiac Rehabilitation    Complete by:  As directed   Diagnosis:   Myocardial Infarction PCI       Diet - low sodium heart healthy    Complete by:  As directed      Discharge instructions    Complete  by:  As directed   NO HEAVY LIFTING (>10lbs) FOR 2 WEEKS. NO SEXUAL ACTIVITY FOR 2 WEEKS. NO DRIVING FOR 1 WEEK. NO SOAKING BATHS, HOT TUBS, POOLS, ETC., FOR 7 DAYS.     Increase activity slowly    Complete by:  As directed            Discharge Medications   Discharge Medication List as of 03/19/2015 11:36 AM    START taking these medications   Details  clopidogrel (PLAVIX) 75 MG tablet Take 1 tablet (75 mg total) by mouth daily with breakfast., Starting 03/19/2015, Until Discontinued, Normal    pantoprazole (PROTONIX) 40 MG tablet Take 1 tablet (40 mg total) by mouth daily., Starting 03/19/2015, Until Discontinued, Normal      CONTINUE these medications which have CHANGED   Details  nitroGLYCERIN (NITROSTAT) 0.4 MG SL tablet Place 1 tablet (0.4 mg total) under the tongue every 5 (five) minutes as needed for chest pain., Starting 03/19/2015, Until Discontinued, Normal      CONTINUE these medications which have NOT CHANGED   Details  Albuterol Sulfate 108 (90 BASE) MCG/ACT AEPB Inhale 2 puffs into the lungs 4 (four) times daily as needed (wheezing and sob)., Until Discontinued, Historical Med    ALPRAZolam (XANAX) 1 MG tablet TAKE 1 TABLET BY MOUTH FOUR TIMES DAILY, Phone In    amLODipine (NORVASC) 5 MG tablet TAKE 1 TABLET BY MOUTH EVERY DAY, Normal    aspirin 81 MG tablet Take 81 mg by mouth daily., Until Discontinued, Historical Med    b complex vitamins tablet Take 1 tablet by mouth daily., Until Discontinued, Historical Med    bisoprolol (ZEBETA) 5 MG tablet Take 0.5 tablets (2.5 mg total) by mouth daily., Starting 03/13/2015, Until Discontinued, No Print    Cholecalciferol 5000 UNITS capsule Take 5,000 Units by mouth daily., Until Discontinued, Historical Med    fluticasone (FLONASE) 50 MCG/ACT nasal spray USE 2 SPRAYS INTO EACH NOSTRIL DAILY., Phone In    hydrochlorothiazide (HYDRODIURIL) 25 MG tablet TAKE 1 TABLET BY MOUTH EVERY DAY., Normal    isosorbide mononitrate  (IMDUR) 30 MG 24 hr tablet Take 1 tablet (30 mg total) by mouth daily., Starting 03/13/2015, Until Discontinued, Normal    losartan (COZAAR) 100 MG tablet Take 0.5 tablets (50 mg total) by mouth daily., Starting 03/13/2015, Until Discontinued, No Print    multivitamin (THERAGRAN) per tablet Take  1 tablet by mouth daily.  , Until Discontinued, Historical Med    omega-3 acid ethyl esters (LOVAZA) 1 G capsule Take 2 g by mouth 2 (two) times daily. , Until Discontinued, Historical Med    potassium chloride SA (K-DUR,KLOR-CON) 20 MEQ tablet TAKE 1 TABLET BY MOUTH TWICE DAILY, Normal    sildenafil (VIAGRA) 100 MG tablet Take 100 mg by mouth daily as needed (for erectile dysfunction). , Until Discontinued, Historical Med    Tiotropium Bromide-Olodaterol (STIOLTO RESPIMAT) 2.5-2.5 MCG/ACT AERS Inhale 2 puffs into the lungs daily., Starting 07/26/2014, Until Discontinued, Print    Umeclidinium-Vilanterol 62.5-25 MCG/INH AEPB Inhale 1 puff into the lungs daily., Starting 08/14/2014, Until Discontinued, Normal    vitamin C (ASCORBIC ACID) 500 MG tablet Take 500 mg by mouth daily., Until Discontinued, Historical Med    ZETIA 10 MG tablet TAKE 1 TABLET BY MOUTH EVERY DAY, Normal      STOP taking these medications     omeprazole (PRILOSEC OTC) 20 MG tablet         Allergies Allergies  Allergen Reactions  . Anoro Ellipta [Umeclidinium-Vilanterol] Other (See Comments)    Cardiac problems  . Stiolto Respimat [Tiotropium Bromide-Olodaterol] Other (See Comments)    Cardiac problems  . Fenofibrate Other (See Comments)    No energy  . Lipitor [Atorvastatin Calcium] Other (See Comments)    Not known   . Niaspan [Niacin] Other (See Comments)    Not known  . Zocor [Simvastatin] Other (See Comments)    Not known    Aspirin prescribed at discharge?  Yes High Intensity Statin Prescribed? (Lipitor 40-80mg  or Crestor 20-40mg ): No: Stain Intolerance secondary to myalgias. On Zetia. Beta Blocker  Prescribed? Yes For EF 45% or less, Was ACEI/ARB Prescribed? Yes ADP Receptor Inhibitor Prescribed? (i.e. Plavix etc.-Includes Medically Managed Patients): Yes For EF <40%, Aldosterone Inhibitor Prescribed? No: EF not less than 40. Was EF assessed during THIS hospitalization? No: LV gram not performed due to LVEDP of 36. Had recent echo on 03/14/2015 Was Cardiac Rehab II ordered? (Included Medically managed Patients): Yes   Outstanding Labs/Studies   None Pending  Duration of Discharge Encounter   Greater than 30 minutes including physician time.  Signed, Erma Heritage, PA-C 03/19/2015, 12:43 PM   Pt seen and examined  Agree with findings as noted above  Pt is stable for discharge with f;/u in clinic.  Dorris Carnes

## 2015-03-19 NOTE — Progress Notes (Signed)
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 Mare Ferrari Patient Profile: 75 yo male w/ PMH of CAD s/p CABG 1999), HTN, HLD, COPD, and CVA admitted on 03/17/2015 for unstable angina.  SUBJECTIVE: Denies any chest pain or shortness of breath overnight. No known complications following his catheterization.   OBJECTIVE Filed Vitals:   03/18/15 1800 03/18/15 1955 03/18/15 2016 03/19/15 0515  BP: 136/92 150/71  119/62  Pulse:  60 55 53  Temp:  97.7 F (36.5 C)  97.8 F (36.6 C)  TempSrc:  Oral  Oral  Resp: 18 16 14 20   Height:      Weight:    199 lb 4.7 oz (90.4 kg)  SpO2:  97% 96% 94%    Intake/Output Summary (Last 24 hours) at 03/19/15 0654 Last data filed at 03/18/15 1945  Gross per 24 hour  Intake    480 ml  Output   1650 ml  Net  -1170 ml   Filed Weights   03/17/15 1741 03/18/15 0513 03/19/15 0515  Weight: 199 lb 11.8 oz (90.6 kg) 198 lb 3.1 oz (89.9 kg) 199 lb 4.7 oz (90.4 kg)    PHYSICAL EXAM General: Well developed, well nourished, male in no acute distress. Head: Normocephalic, atraumatic.  Neck: Supple without bruits, JVD not elevated. Lungs:  Resp regular and unlabored, CTA without wheezing or rales. Heart: RRR, S1, S2, no S3, S4, or murmur; no rub. Abdomen: Soft, non-tender, non-distended with normoactive bowel sounds. No hepatomegaly. No rebound/guarding. No obvious abdominal masses. Extremities: No clubbing, cyanosis, or edema. Distal pedal pulses are 2+ bilaterally. Right groin site with minimal ecchymosis. No evidence of a hematoma. Neuro: Alert and oriented X 3. Moves all  extremities spontaneously. Psych: Normal affect.   LABS: CBC: Recent Labs  03/17/15 1508  WBC 5.5  HGB 13.6  HCT 39.8  MCV 94.3  PLT [id number]   Basic Metabolic Panel: Recent Labs  03/17/15 1508  NA 138  K 3.7  CL 100*  CO2 26  GLUCOSE 106*  BUN 12  CREATININE 0.97  CALCIUM 9.5   Cardiac Enzymes: Recent Labs  03/18/15 1105 03/18/15 1845 03/19/15 0005  TROPONINI 1.53* 6.06* 5.20*    Recent Labs  03/17/15 1516  TROPIPOC 0.09*   TELE:   1st degree AV Block, HR in 50's - 60's.      ECG: 1st degree block, HR 53, nonspecific T-wave changes.  CARDIAC CATHETERIZATION:  1. Prox LAD to Mid LAD lesion, 95% stenosed. Mid LAD lesion, 100% stenosed. Ost 2nd Diag to 2nd Diag lesion, 80% stenosed. 2. Ost Cx to Prox Cx lesion, 100% stenosed. 3. Mid RCA to Dist RCA lesion, 100% stenosed. Post Atrio lesion, 100% stenosed. 4. LIMA was injected is large. JR4 Catheter used 5. SVG-RCA & SVG-DIAG 100% occluded 6. Prox Graft lesion SVG-OM, 99% stenosed. Very large - TIMI 1 flow to graft insertion, but no flow in the OM. 7. Following PCI of SVG-OM with distal protection device using Synergy DES, Post intervention, there is a 0% residual stenosis. 8. Mid Cx to Dist Cx lesion, 65% stenosed - not identified until post PCI  Overall severe native  coronary artery disease with occluded native mid LAD, proximal circumflex and mid RCA. Patent LIMA-LAD, but 2 of 3 vein grafts 100% occluded (SVG-RCA and SVG-diagonal). - 99% proximal stenosis of SVG-OM treated with DES stent. Initially there was concern basilar patient having history of Mallory-Weiss tear with severe GI bleed 8 years ago. I confirmed with Dr. Mare Ferrari, this should not be an issue being so far distant to that event. The plan is to use Plavix, but based on the length of the lesion, I felt it best to be using a drug-eluting stent Relatively difficult procedure due to the extent of thrombus and debris in the vessel with TIMI 1 flow  pre-PCI.  Overall successful PCI of the SVG-OM1 and TIMI-3 flow.  Plan:  Monitor overnight in the postprocedure unit on 6 Central.  Sheath removal with manual pressure for hemostasis  I would continue Plavix for minimum of 6 months, preferably one year. Would likely be fine stopping Plavix after 3 months.  Continue other cardiac treatment. He has a severely elevated LVEDP and may require diuresis prior to discharge.  No family Members present to discuss results upon completion of the procedure.  Radiology/Studies: Dg Chest Port 1 View: 03/17/2015  CLINICAL DATA:  LEFT chest pain radiating to arms, back and jaw for 1 week, diarrhea for 4-5 days, asthma, COPD, hypertension, coronary artery disease post CABG EXAM: PORTABLE CHEST 1 VIEW COMPARISON:  Portable exam 1554 hours compared to 05/26/2003 FINDINGS: Borderline enlargement of cardiac silhouette post CABG. Atherosclerotic calcification aorta. Slight pulmonary vascular congestion. Mild atelectasis versus infiltrate LEFT lower lobe, new. Remaining lungs clear. No pleural effusion or pneumothorax. Bones demineralized. IMPRESSION: Borderline enlargement of cardiac silhouette with vascular congestion post CABG. Atelectasis versus consolidation LEFT lower lobe. Electronically Signed   By: Lavonia Dana M.D.   On: 03/17/2015 16:16     Current Medications:  . ALPRAZolam  1 mg Oral QID  . amLODipine  5 mg Oral Daily  . angioplasty book   Does not apply Once  . aspirin EC  81 mg Oral Daily  . B-complex with vitamin C  1 tablet Oral Daily  . bisoprolol  2.5 mg Oral Daily  . cholecalciferol  5,000 Units Oral Daily  . clopidogrel  75 mg Oral Q breakfast  . ezetimibe  10 mg Oral Daily  . fluticasone  1 spray Each Nare Daily  . heart attack bouncing book   Does not apply Once  . hydrALAZINE  10 mg Intravenous Once  . hydrochlorothiazide  25 mg Oral Daily  . losartan  50 mg Oral Daily  . multivitamin with minerals  1 tablet Oral Daily  .  nitroGLYCERIN  1 inch Topical 4 times per day  . omega-3 acid ethyl esters  2 g Oral BID  . pantoprazole  40 mg Oral Daily  . potassium chloride SA  20 mEq Oral BID  . vitamin C  500 mg Oral Daily      ASSESSMENT AND PLAN:  1. NSTEMI - history of CAD, s/p CABG in 1999. Had 2-3 weeks of NTG responsive chest pain and had accelerating pain on 03/17/2015. Cyclic troponin values have been 1.53 and 6.06 thus far. - Cath on 03/18/2015 showing overall severe native coronary artery disease with occluded native mid LAD, proximal circumflex and mid RCA. He had a Patent LIMA-LAD, but 2 of 3 vein grafts were 100% occluded (SVG-RCA and SVG-diagonal). He had 99% proximal stenosis of SVG-OM and this was treated with a DES stent. -  He will need to remain on Plavix for 6 months, but Dr. Ellyn Hack made mention he would likely be fine stopping after 3 months if concern arises about GI bleeding. (Had a history of Mallory-Weiss tear with severe GI bleed 8 years ago). - Continue ASA, Plavix, BB, Zetia, and ARB.   2. HLD - continue Zetia due to statin intolerance.  3. Bradycardia - HR in mid 50's to 60's.  - on low-dose Bisoprolol 2.5mg  daily. Would not titrate at this time.   Signed, Erma Heritage , PA-C 6:54 AM 03/19/2015 Pager: [phone]  Patient seen and examined   Lungs are clear  Cardiac exam RRR  Ext no edema Pt put out 1 L yesterday  Feels good Plan to d/c today  Will make sure outpt appt made.    Dorris Carnes

## 2015-03-19 NOTE — Progress Notes (Signed)
   03/19/15 1132  Clinical Encounter Type  Visited With Patient;Health care provider  Visit Type Initial;Spiritual support  Referral From Patient  Spiritual Encounters  Spiritual Needs Prayer;Emotional  Stress Factors  Patient Stress Factors Family relationships;Exhausted   Chaplain responded to a request to visit with and support the patient. Patient has a lot going on in his family; grandchildren are homeless; wife and sister have been sick; etc. Chaplain offered support and prayer, and our services are available as needed.   Jeri Lager, Chaplain 03/19/2015 11:33 AM

## 2015-03-20 NOTE — Telephone Encounter (Signed)
Patient contacted regarding discharge from [facility] on 03/19/15.  Patient understands to follow up with provider Margaret Pyle on 03/25/15 at 12:10 at [facility]. Patient understands discharge instructions? yes Patient understands medications and regiment? yes Patient understands to bring all medications to this visit? yes  Spoke w/wife who states he is doing well.  States he is weak but feeling much better.  States she has not checked his groin this morning for bleeding or redness. States he is very scared about taking the Plavix due to his history of bleeding.  Advised if they see any signs of bleeding to call the office. She was d/c last week from having GB surgery and he was admitted on 1/15 with the chest pain.  They are taking care of each other. She verbalizes understanding to bring his medications to office visit on 1/23.

## 2015-03-24 NOTE — Progress Notes (Signed)
Cardiology Office Note:    Date:  03/25/2015   ID:  William Elliott, DOB [birth year], MRN [id number]  PCP:  [name], MD  Cardiologist:  Dr. Darlin Coco >> Dr. [name] [facility]) Electrophysiologist:  n/a  Chief Complaint  Patient presents with  . Hospitalization Follow-up    s/p NSTEMI >> PCI   . Coronary Artery Disease    History of Present Illness:    William Elliott is a 75 y.o. male with a hx of CAD s/p CABG in 1999, carotid stenosis, ED, HL, HTN, COPD/asthma. His notes indicate he had an echo in 2008 with normal LVF, mod LVH, diastolic dysfunction, mild aortic sclerosis, mild MR. I saw him recently with complaints of nocturnal angina.  I placed him on nitrates and arrange cardiac cath.  He was admitted 1/15-1/17.  He presented 1 day prior to his scheduled cardiac cath with worsening symptoms.  He ruled in for a NSTEMI.  LHC demonstrated severe native vessel CAD with an occluded mid LAD, proximal circumflex and mid RCA.  L-LAD was patent and 2 out of 3 vein grafts were occluded. He underwent PCI with a DES to the SVG-OM.  Aspirin and Plavix was recommended for a minimum of 6 months (preferably 1 year). LVEDP was elevated at cardiac catheterization (36). He received 1 dose of IV Lasix.  Of note, the patient had a prior history of upper GI bleed secondary to Mallory-Weiss Tear years ago. This was discussed with Dr. Mare Ferrari who felt that it would be safe to continue the patient on aspirin and Plavix.  Returns for follow-up.  Here with his wife.  He is doing much better.  No further chest pain or dyspnea. He denies orthopnea, PND, edema. Denies syncope.  Denies any bleeding. Denies cough or wheezing.    Past Medical History  Diagnosis Date  . Hyperlipidemia   . Carotid bruit     Asymptomatic left carotid bruit  . Ischemic heart disease   . Erectile dysfunction   . Dyslipidemia   . COPD (chronic obstructive pulmonary disease) ([place])   . History of Doppler ultrasound  04/13/2007    1. 20-39% stenosis noted in bilateral internal carotid arteries. 1. 20-39% stenosis noted in bilateral internal carotid arteries. distal common carotid artery.  3. Antegrade bilateral vertebral arteries.  . History of echocardiogram     Echo 1/17: EF 60-65%, normal wall motion, grade 2 diastolic dysfunction, AV mean gradient 7 mmHg, MAC, mild MR, severe LAE  . Hypertension   . Coronary artery disease     a. CABG - 1999 with LIMA-LAD, SVG-DIAG, SVG-OM, SVG-RPDA  b. Cath in setting of NSTEMI 03/18/2015: patent LIMA-LAD, occluded SVG-RCA, occluded SVG-D1, 99% stenosis of SVG-OM treated w/ DES  . Myocardial infarction ([place])     "I've had 2 silent ones" (03/18/2015)  . Asthma   . History of blood transfusion ~ 2008?    related to lower gi bleeding  . Acute lower GI bleeding ~ 2008?  Marland Kitchen Stroke [facility])     "they say I've had 2; silent"; denies residual on 03/18/2015  . Anxiety   . Depression     Past Surgical History  Procedure Laterality Date  . Coronary artery bypass graft  1999    "CABG X4"  . Tibia fracture surgery Left [facility]; took bone out of my right hip  . Cardiac catheterization    . Cardiac catheterization N/A 03/18/2015    Procedure: Left  Heart Cath and Cors/Grafts Angiography;  Surgeon: Leonie Man, MD;  Location: [place] [facility];  Service: Cardiovascular;  Laterality: N/A;  . Cardiac catheterization N/A 03/18/2015    Procedure: Coronary Stent Intervention;  Surgeon: Leonie Man, MD;  Location: [place] [facility];  Service: Cardiovascular;  Laterality: N/A;  . Tonsillectomy    . Hernia repair    . Umbilical hernia repair  ~ 2000  . Fracture surgery    . Cataract extraction w/ intraocular lens  implant, bilateral Bilateral   . Coronary angioplasty      Current Medications: Outpatient Prescriptions Prior to Visit  Medication Sig Dispense Refill  . Albuterol Sulfate 108 (90 BASE) MCG/ACT AEPB Inhale 2 puffs into the lungs 4 (four) times  daily as needed (wheezing and sob).    . ALPRAZolam (XANAX) 1 MG tablet TAKE 1 TABLET BY MOUTH FOUR TIMES DAILY 120 tablet 2  . amLODipine (NORVASC) 5 MG tablet TAKE 1 TABLET BY MOUTH EVERY DAY 30 tablet 10  . aspirin 81 MG tablet Take 81 mg by mouth daily.    Marland Kitchen b complex vitamins tablet Take 1 tablet by mouth daily.    . bisoprolol (ZEBETA) 5 MG tablet Take 0.5 tablets (2.5 mg total) by mouth daily.    . Cholecalciferol 5000 UNITS capsule Take 5,000 Units by mouth daily.    . clopidogrel (PLAVIX) 75 MG tablet Take 1 tablet (75 mg total) by mouth daily with breakfast. 30 tablet 6  . fluticasone (FLONASE) 50 MCG/ACT nasal spray USE 2 SPRAYS INTO EACH NOSTRIL DAILY. 16 g 2  . hydrochlorothiazide (HYDRODIURIL) 25 MG tablet TAKE 1 TABLET BY MOUTH EVERY DAY. 30 tablet 10  . isosorbide mononitrate (IMDUR) 30 MG 24 hr tablet Take 1 tablet (30 mg total) by mouth daily. 30 tablet 11  . losartan (COZAAR) 100 MG tablet Take 0.5 tablets (50 mg total) by mouth daily.    . multivitamin (THERAGRAN) per tablet Take 1 tablet by mouth daily.      . nitroGLYCERIN (NITROSTAT) 0.4 MG SL tablet Place 1 tablet (0.4 mg total) under the tongue every 5 (five) minutes as needed for chest pain. 25 tablet 3  . omega-3 acid ethyl esters (LOVAZA) 1 G capsule Take 2 g by mouth 2 (two) times daily.     . pantoprazole (PROTONIX) 40 MG tablet Take 1 tablet (40 mg total) by mouth daily. 30 tablet 6  . potassium chloride SA (K-DUR,KLOR-CON) 20 MEQ tablet TAKE 1 TABLET BY MOUTH TWICE DAILY 180 tablet 3  . sildenafil (VIAGRA) 100 MG tablet Take 100 mg by mouth daily as needed (for erectile dysfunction).     . vitamin C (ASCORBIC ACID) 500 MG tablet Take 500 mg by mouth daily.    Marland Kitchen ZETIA 10 MG tablet TAKE 1 TABLET BY MOUTH EVERY DAY 30 tablet 10  . Tiotropium Bromide-Olodaterol (STIOLTO RESPIMAT) 2.5-2.5 MCG/ACT AERS Inhale 2 puffs into the lungs daily. (Patient not taking: Reported on 03/25/2015) 1 Inhaler 11  .  Umeclidinium-Vilanterol 62.5-25 MCG/INH AEPB Inhale 1 puff into the lungs daily. (Patient not taking: Reported on 03/25/2015) 1 each 6   No facility-administered medications prior to visit.     Allergies:   Anoro ellipta; Stiolto respimat; Fenofibrate; Lipitor; Niaspan; and Zocor   Social History   Social History  . Marital Status: Married    Spouse Name: N/A  . Number of Children: N/A  . Years of Education: N/A   Occupational History  . Welder x  10 yrs     Social History Main Topics  . Smoking status: Former Smoker -- 10 years    Types: Cigars    Quit date: 03/02/1997  . Smokeless tobacco: Never Used     Comment: smoked cigars x 10 yrs, never smoked cigarettes  . Alcohol Use: 0.0 oz/week    0 Standard drinks or equivalent per week     Comment: "drank some when I was young; quit in the 1990s"  . Drug Use: No  . Sexual Activity: Not Currently   Other Topics Concern  . None   Social History Narrative     Family History:  The patient's family history includes Heart attack in his father; Heart disease in his father; Hypertension in his mother; Stroke in his mother.   ROS:   Please see the history of present illness.    Review of Systems  Hematologic/Lymphatic: Bruises/bleeds easily.  Gastrointestinal: Positive for constipation.  Psychiatric/Behavioral: The patient is nervous/anxious.   All other systems reviewed and are negative.   Physical Exam:    VS:  BP 122/70 mmHg  Pulse 54  Ht 5\' 10"  (1.778 m)  Wt 206 lb 1.9 oz (93.495 kg)  BMI 29.58 kg/m2   GEN: Well nourished, well developed, in no acute distress HEENT: normal Neck: no JVD, no masses Cardiac: Normal [id number], RRR; 1/6 systolic murmur RUSB, R groin without hematoma or bruit  Respiratory:  clear to auscultation bilaterally; no wheezing, rhonchi or rales GI: soft, nontender  MS: no deformity or atrophy Skin: warm and dry  Neuro:  no focal deficits  Psych: Alert and oriented x 3, normal affect  Wt Readings  from Last 3 Encounters:  03/25/15 206 lb 1.9 oz (93.495 kg)  03/19/15 199 lb 4.7 oz (90.4 kg)  03/13/15 209 lb 12.8 oz (95.165 kg)      Studies/Labs Reviewed:    EKG:  EKG is  ordered today.  The ekg ordered today demonstrates sinus brady, HR 51, QTc 409 ms, 1st degree AVB (PR 338 ms), NSSTTW changes  Recent Labs: 12/14/2014: ALT 29 03/19/2015: BUN 11; Creatinine, Ser 1.06; Hemoglobin 14.6; Platelets 243; Potassium 3.6; Sodium 137   Recent Lipid Panel    Component Value Date/Time   CHOL 176 12/14/2014 1504   TRIG 133 12/14/2014 1504   HDL 32* 12/14/2014 1504   CHOLHDL 5.5* 12/14/2014 1504   VLDL 27 12/14/2014 1504   LDLCALC 117 12/14/2014 1504   LDLDIRECT 117.0 04/20/2014 1303    Additional studies/ records that were reviewed today include:   LHC 03/18/15 LAD prox 95%, mid 100%, D2 80% LCx ostial 100%, mid 65%; dist LCx fills by collats from AM RCA mid 100%, RPDA fills by L-R collats from L-LAD; RPAVB 100% L-LAD ok S-RPDA 100% S-D2 100% S-OM1 prox 99% PCI:  4 x 38 mm Synergy DES to S-OM1    Overall severe native coronary artery disease with occluded native mid LAD, proximal circumflex and mid RCA. Patent LIMA-LAD, but 2 of 3 vein grafts 100% occluded (SVG-RCA and SVG-diagonal). - 99% proximal stenosis of SVG-OM treated with DES stent.  Echo 03/14/15 EF 60-65%, normal wall motion,, grade 2 diastolic dysfunction, MAC, mild MR, severe LAE  Carotid US 04/2007 Bilateral ICA 20-39%   ASSESSMENT:    1. Coronary artery disease involving coronary bypass graft of native heart without angina pectoris   2. Chronic diastolic CHF (congestive heart failure) ([place])   3. Essential hypertension   4. Hyperlipidemia   5. Carotid stenosis, bilateral  6. Bradycardia     PLAN:    In order of problems listed above:  1. CAD - s/p remote CABG. Recent admit with NSTEMI and LHC with 3 v CAD, 2/4 grafts occluded. He underwent PCI with DES to S-OM1.  He is doing well since DC.  We  discussed the importance of dual antiplatelet therapy. Continue ASA, Plavix, beta-blocker, nitrates. He is intol of statins.  He is not interested in formal cardiac rehab.  He will walk on his own. He will FU with Dr. Domenic Polite in [place].  His wife will also see Dr. Domenic Polite.  2. Chronic Diastolic CHF - Grade 2 diastolic dysfunction by recent echo.  LVEDP was elevated at recent cath and he received IV Lasix x 1 with good UOP.  Volume stable.  Continue HCTZ.  3. HTN - Controlled.   4. HL - Statin intolerant. Continue Zetia.  5. Carotid stenosis - Hx of asymptomatic L carotid bruit and mild ICA stenosis by carotid US in 2009. Continue ASA.   6. Bradycardia - HR stable.  He is asymptomatic.     Medication Adjustments/Labs and Tests Ordered: Current medicines are reviewed at length with the patient today.  Concerns regarding medicines are outlined above.  Medication changes, Labs and Tests ordered today are outlined in the Patient Instructions noted below. Patient Instructions  Medication Instructions:  Your physician recommends that you continue on your current medications as directed. Please refer to the Current Medication list given to you today.   Labwork: NONE  Testing/Procedures: NONE  Follow-Up: PT NEEDS TO FOLLOW UP WITH DR. MCDOWELL IN THE [facility] OFFICE; PREVIOUS [facility] PT; PT'S WIFE STATES SHE IS A [facility] PT AND WOULD LIKE TO SEE DR. MCDOWELL AS WELL IN THE [facility] OFFICE  Any Other Special Instructions Will Be Listed Below (If Applicable).   If you need a refill on your cardiac medications before your next appointment, please call your pharmacy.       Signed, Richardson Dopp, PA-C  03/25/2015 12:53 PM    [facility] [address], [place], [place]  [address] Phone: [phone]; Fax: [phone]

## 2015-03-25 ENCOUNTER — Ambulatory Visit (INDEPENDENT_AMBULATORY_CARE_PROVIDER_SITE_OTHER): Payer: Medicare Other | Admitting: Physician Assistant

## 2015-03-25 ENCOUNTER — Encounter: Payer: Self-pay | Admitting: Physician Assistant

## 2015-03-25 VITALS — BP 122/70 | HR 54 | Ht 70.0 in | Wt 206.1 lb

## 2015-03-25 DIAGNOSIS — R001 Bradycardia, unspecified: Secondary | ICD-10-CM

## 2015-03-25 DIAGNOSIS — I5032 Chronic diastolic (congestive) heart failure: Secondary | ICD-10-CM

## 2015-03-25 DIAGNOSIS — I10 Essential (primary) hypertension: Secondary | ICD-10-CM

## 2015-03-25 DIAGNOSIS — I25810 Atherosclerosis of coronary artery bypass graft(s) without angina pectoris: Secondary | ICD-10-CM

## 2015-03-25 DIAGNOSIS — I6523 Occlusion and stenosis of bilateral carotid arteries: Secondary | ICD-10-CM

## 2015-03-25 DIAGNOSIS — E785 Hyperlipidemia, unspecified: Secondary | ICD-10-CM | POA: Diagnosis not present

## 2015-03-25 NOTE — Patient Instructions (Signed)
Medication Instructions:  Your physician recommends that you continue on your current medications as directed. Please refer to the Current Medication list given to you today.   Labwork: NONE  Testing/Procedures: NONE  Follow-Up: PT NEEDS TO FOLLOW UP WITH DR. MCDOWELL IN THE [facility] OFFICE; PREVIOUS [facility] PT; PT'S WIFE STATES SHE IS A [facility] PT AND WOULD LIKE TO SEE DR. MCDOWELL AS WELL IN THE [facility] OFFICE  Any Other Special Instructions Will Be Listed Below (If Applicable).   If you need a refill on your cardiac medications before your next appointment, please call your pharmacy.

## 2015-03-27 ENCOUNTER — Other Ambulatory Visit: Payer: Self-pay | Admitting: *Deleted

## 2015-03-27 DIAGNOSIS — I119 Hypertensive heart disease without heart failure: Secondary | ICD-10-CM

## 2015-03-27 MED ORDER — BISOPROLOL FUMARATE 5 MG PO TABS: 2.5000 mg | ORAL_TABLET | Freq: Every day | ORAL | Status: DC

## 2015-04-01 ENCOUNTER — Ambulatory Visit: Payer: Medicare Other | Admitting: Physician Assistant

## 2015-04-19 ENCOUNTER — Ambulatory Visit: Payer: Medicare Other | Admitting: Physician Assistant

## 2015-05-21 ENCOUNTER — Other Ambulatory Visit: Payer: Self-pay | Admitting: Cardiology

## 2015-06-05 DIAGNOSIS — J449 Chronic obstructive pulmonary disease, unspecified: Secondary | ICD-10-CM | POA: Diagnosis not present

## 2015-06-05 DIAGNOSIS — I252 Old myocardial infarction: Secondary | ICD-10-CM | POA: Diagnosis not present

## 2015-06-05 DIAGNOSIS — I259 Chronic ischemic heart disease, unspecified: Secondary | ICD-10-CM | POA: Diagnosis not present

## 2015-06-10 ENCOUNTER — Ambulatory Visit (INDEPENDENT_AMBULATORY_CARE_PROVIDER_SITE_OTHER): Payer: Medicare Other | Admitting: Cardiology

## 2015-06-10 ENCOUNTER — Encounter: Payer: Self-pay | Admitting: Cardiology

## 2015-06-10 VITALS — BP 121/63 | HR 49 | Ht 70.0 in | Wt 201.2 lb

## 2015-06-10 DIAGNOSIS — N529 Male erectile dysfunction, unspecified: Secondary | ICD-10-CM

## 2015-06-10 DIAGNOSIS — Z789 Other specified health status: Secondary | ICD-10-CM

## 2015-06-10 DIAGNOSIS — I25810 Atherosclerosis of coronary artery bypass graft(s) without angina pectoris: Secondary | ICD-10-CM | POA: Diagnosis not present

## 2015-06-10 DIAGNOSIS — I10 Essential (primary) hypertension: Secondary | ICD-10-CM

## 2015-06-10 DIAGNOSIS — E785 Hyperlipidemia, unspecified: Secondary | ICD-10-CM

## 2015-06-10 DIAGNOSIS — Z889 Allergy status to unspecified drugs, medicaments and biological substances status: Secondary | ICD-10-CM | POA: Diagnosis not present

## 2015-06-10 DIAGNOSIS — I6523 Occlusion and stenosis of bilateral carotid arteries: Secondary | ICD-10-CM

## 2015-06-10 NOTE — Patient Instructions (Addendum)
Your physician recommends that you continue on your current medications as directed. Please refer to the Current Medication list given to you today. Stop viagra. Your physician recommends that you schedule a follow-up appointment in: 6 months. You will receive a reminder letter in the mail in about 4 months reminding you to call and schedule your appointment. If you don't receive this letter, please contact our office.

## 2015-06-10 NOTE — Progress Notes (Signed)
Cardiology Office Note  Date: 06/10/2015   ID: William Elliott, DOB 12/01/1940, MRN [id number]  PCP: William Neighbors, MD  Evaluating Cardiologist: William Lesches, MD   Chief Complaint  Patient presents with  . Coronary Artery Disease    History of Present Illness: William Elliott is a 75 y.o. male former patient of Dr. Mare Elliott, now establishing follow-up with me. This is our first meeting in the office. I reviewed extensive records and updated his chart. He was most recently seen by William Elliott in January of this year. Cardiac catheterization done at that time is reviewed below, he underwent intervention with DES to the SVG to OM at that time in the setting of NSTEMI and graft disease.  He presents today for a follow-up visit. Does not report any angina or nitroglycerin use. He states that he is getting over a recent "cold" reporting fatigue and cough, also mild dizziness. He saw Dr. [place] Elliott for the symptoms.  I reviewed his cardiac medications. Regimen includes aspirin, Plavix, Norvasc, HCTZ, potassium supplements, Imdur, Cozaar, and as needed nitroglycerin. I also see that he was taking Viagra occasionally, I cautioned him against this since he is taking a long-acting nitroglycerin and asked him to stop.  I reviewed his ECG and echocardiogram from earlier in the year.   Past Medical History  Diagnosis Date  . Hyperlipidemia   . Carotid artery disease ([place])     Asymptomatic left carotid bruit  . Erectile dysfunction   . Dyslipidemia   . COPD (chronic obstructive pulmonary disease) ([place])   . Essential hypertension   . Coronary artery disease     a. CABG - 1999 with LIMA-LAD, SVG-DIAG, SVG-OM, SVG-RPDA  b. Cath in setting of NSTEMI 03/18/2015: patent LIMA-LAD, occluded SVG-RCA, occluded SVG-D1, 99% stenosis of SVG-OM treated w/ DES  . Myocardial infarction ([place])   . Asthma   . History of blood transfusion 2008  . Acute lower GI bleeding 2008  . History of stroke   . Anxiety     . Depression     Past Surgical History  Procedure Laterality Date  . Coronary artery bypass graft  1999  . Tibia fracture surgery Left [facility]; took bone out of my right hip  . Cardiac catheterization    . Cardiac catheterization N/A 03/18/2015    Procedure: Left Heart Cath and Cors/Grafts Angiography;  Surgeon: Leonie Man, MD;  Location: [facility];  Service: Cardiovascular;  Laterality: N/A;  . Cardiac catheterization N/A 03/18/2015    Procedure: Coronary Stent Intervention;  Surgeon: Leonie Man, MD;  Location: [place];  Service: Cardiovascular;  Laterality: N/A;  . Tonsillectomy    . Hernia repair    . Umbilical hernia repair  ~ 2000  . Fracture surgery    . Cataract extraction w/ intraocular lens  implant, bilateral Bilateral   . Coronary angioplasty      Current Outpatient Prescriptions  Medication Sig Dispense Refill  . Albuterol Sulfate 108 (90 BASE) MCG/ACT AEPB Inhale 2 puffs into the lungs 4 (four) times daily as needed (wheezing and sob).    . ALPRAZolam (XANAX) 1 MG tablet TAKE 1 TABLET BY MOUTH FOUR TIMES DAILY 120 tablet 2  . amLODipine (NORVASC) 5 MG tablet TAKE 1 TABLET BY MOUTH EVERY DAY 30 tablet 10  . aspirin 81 MG tablet Take 81 mg by mouth daily.    Marland Kitchen b complex vitamins tablet Take 1 tablet  by mouth daily.    . bisoprolol (ZEBETA) 5 MG tablet Take 0.5 tablets (2.5 mg total) by mouth daily. 30 tablet 11  . Cholecalciferol 5000 UNITS capsule Take 5,000 Units by mouth daily.    . clopidogrel (PLAVIX) 75 MG tablet Take 1 tablet (75 mg total) by mouth daily with breakfast. 30 tablet 6  . fluticasone (FLONASE) 50 MCG/ACT nasal spray USE 2 SPRAYS INTO EACH NOSTRIL DAILY. 16 g 2  . hydrochlorothiazide (HYDRODIURIL) 25 MG tablet TAKE 1 TABLET BY MOUTH EVERY DAY. 30 tablet 10  . isosorbide mononitrate (IMDUR) 30 MG 24 hr tablet Take 1 tablet (30 mg total) by mouth daily. 30 tablet 11  . losartan (COZAAR) 100 MG tablet Take  0.5 tablets (50 mg total) by mouth daily.    . multivitamin (THERAGRAN) per tablet Take 1 tablet by mouth daily.      . nitroGLYCERIN (NITROSTAT) 0.4 MG SL tablet Place 1 tablet (0.4 mg total) under the tongue every 5 (five) minutes as needed for chest pain. 25 tablet 3  . omega-3 acid ethyl esters (LOVAZA) 1 g capsule TAKE 2 CAPSULES BY MOUTH TWICE DAILY AS NEEDED 120 capsule 3  . pantoprazole (PROTONIX) 40 MG tablet Take 1 tablet (40 mg total) by mouth daily. 30 tablet 6  . potassium chloride SA (K-DUR,KLOR-CON) 20 MEQ tablet TAKE 1 TABLET BY MOUTH TWICE DAILY 180 tablet 3  . vitamin C (ASCORBIC ACID) 500 MG tablet Take 500 mg by mouth daily.    Marland Kitchen ZETIA 10 MG tablet TAKE 1 TABLET BY MOUTH EVERY DAY 30 tablet 10   No current facility-administered medications for this visit.   Allergies:  Anoro ellipta; Stiolto respimat; Fenofibrate; Lipitor; Niaspan; and Zocor   Social History: The patient  reports that he quit smoking about 18 years ago. His smoking use included Cigars. He has never used smokeless tobacco. He reports that he drinks alcohol. He reports that he does not use illicit drugs.   ROS:  Please see the history of present illness. Otherwise, complete review of systems is positive for decreased hearing, arthritis symptoms.  All other systems are reviewed and negative.   Physical Exam: VS:  BP 121/63 mmHg  Pulse 49  Ht 5\' 10"  (1.778 m)  Wt 201 lb 3.2 oz (91.264 kg)  BMI 28.87 kg/m2  SpO2 95%, BMI Body mass index is 28.87 kg/(m^2).  Wt Readings from Last 3 Encounters:  06/10/15 201 lb 3.2 oz (91.264 kg)  03/25/15 206 lb 1.9 oz (93.495 kg)  03/19/15 199 lb 4.7 oz (90.4 kg)    General: Patient appears comfortable at rest. HEENT: Conjunctiva and lids normal, oropharynx clear. Neck: Supple, no elevated JVP or carotid bruits, no thyromegaly. Lungs: Clear to auscultation, nonlabored breathing at rest. Cardiac: Regular rate and rhythm, no S3, soft systolic murmur, no pericardial  rub. Abdomen: Soft, nontender, bowel sounds present, no guarding or rebound. Extremities: Trace leg edema, distal pulses 2+. Skin: Warm and dry. Musculoskeletal: No kyphosis. Neuropsychiatric: Alert and oriented x3, affect grossly appropriate.  ECG: I personally reviewed the prior tracing from 03/25/2015 which showed sinus rhythm with prolonged PR interval and nonspecific T-wave changes.  Recent Labwork: 12/14/2014: ALT 29; AST 27 03/19/2015: BUN 11; Creatinine, Ser 1.06; Hemoglobin 14.6; Platelets 243; Potassium 3.6; Sodium 137     Component Value Date/Time   CHOL 176 12/14/2014 1504   TRIG 133 12/14/2014 1504   HDL 32* 12/14/2014 1504   CHOLHDL 5.5* 12/14/2014 1504   VLDL 27 12/14/2014 1504  Miguel Barrera 117 12/14/2014 1504   LDLDIRECT 117.0 04/20/2014 1303    Other Studies Reviewed Today:  Cardiac catheterization 03/18/2015: Conclusion    1. Prox LAD to Mid LAD lesion, 95% stenosed. Mid LAD lesion, 100% stenosed. Ost 2nd Diag to 2nd Diag lesion, 80% stenosed. 2. Ost Cx to Prox Cx lesion, 100% stenosed. 3. Mid RCA to Dist RCA lesion, 100% stenosed. Post Atrio lesion, 100% stenosed. 4. LIMA was injected is large. JR4 Catheter used 5. SVG-RCA & SVG-DIAG 100% occluded 6. Prox Graft lesion SVG-OM, 99% stenosed. Very large - TIMI 1 flow to graft insertion, but no flow in the OM. 7. Following PCI of SVG-OM with distal protection device using Synergy DES, Post intervention, there is a 0% residual stenosis. 8. Mid Cx to Dist Cx lesion, 65% stenosed - not identified until post PCI   Overall severe native coronary artery disease with occluded native mid LAD, proximal circumflex and mid RCA. Patent LIMA-LAD, but 2 of 3 vein grafts 100% occluded (SVG-RCA and SVG-diagonal). - 99% proximal stenosis of SVG-OM treated with DES stent. Initially there was concern basilar patient having history of Mallory-Weiss tear with severe GI bleed 8 years ago. I confirmed with Dr. Mare Elliott, this should not be  an issue being so far distant to that event. The plan is to use Plavix, but based on the length of the lesion, I felt it best to be using a drug-eluting stent Relatively difficult procedure due to the extent of thrombus and debris in the vessel with TIMI 1 flow pre-PCI.  Overall successful PCI of the SVG-OM1 and TIMI-3 flow.  Plan:  Monitor overnight in the postprocedure unit on 6 [facility].  Sheath removal with manual pressure for hemostasis  I would continue Plavix for minimum of 6 months, preferably one year. Would likely be fine stopping Plavix after 3 months.  Continue other cardiac treatment. He has a severely elevated LVEDP and may require diuresis prior to discharge.   Echocardiogram 03/14/2015: Study Conclusions  - Left ventricle: The cavity size was normal. Wall thickness was  normal. Systolic function was normal. The estimated ejection  fraction was in the range of 60% to 65%. Wall motion was normal;  there were no regional wall motion abnormalities. Features are  consistent with a pseudonormal left ventricular filling pattern,  with concomitant abnormal relaxation and increased filling  pressure (grade 2 diastolic dysfunction). Doppler parameters are  consistent with high ventricular filling pressure. - Aortic valve: Mildly calcified annulus. Trileaflet; mildly  thickened leaflets. Valve area (VTI): 2.17 cm^2. Valve area  (Vmax): 2.04 cm^2. - Mitral valve: Mildly calcified annulus. Mildly thickened leaflets  . There was mild regurgitation. - Left atrium: The atrium was severely dilated. - Technically adequate study.  Assessment and Plan:  1. Symptomatically stable multivessel CAD status post CABG in 1999. He has recently documented graft disease with an NSTEMI in January of this year. At that time there was occlusion of the SVG to RCA as well as SVG to diagonal. Severe stenosis within the SVG to obtuse marginal was treated with DES. LVEF is normal range. Plan  to continue current regimen including DAPT. He does not report any bleeding problems.  2. Essential hypertension, blood pressure is well controlled today.  3. Hyperlipidemia with statin and niacin intolerance.  4. Erectile dysfunction. I asked him not to use Viagra since he is on long-acting nitrates on a regular basis.  Current medicines were reviewed with the patient today.  Disposition: FU with me in 6 months.  Signed, Satira Sark, MD, [facility] 06/10/2015 3:35 PM    [facility] [place], [place], [place] [address] Phone: [phone]; Fax: [phone]

## 2015-07-05 ENCOUNTER — Other Ambulatory Visit: Payer: Self-pay | Admitting: *Deleted

## 2015-07-05 MED ORDER — EZETIMIBE 10 MG PO TABS: 10.0000 mg | ORAL_TABLET | Freq: Every day | ORAL | Status: DC

## 2015-07-05 NOTE — Telephone Encounter (Signed)
Insurance will no longer cover brand name zetia since it has gone generic. Patient advised that new rx would be sent for generic zetia.

## 2015-07-17 DIAGNOSIS — E782 Mixed hyperlipidemia: Secondary | ICD-10-CM | POA: Diagnosis not present

## 2015-07-17 DIAGNOSIS — K219 Gastro-esophageal reflux disease without esophagitis: Secondary | ICD-10-CM | POA: Diagnosis not present

## 2015-07-17 DIAGNOSIS — F411 Generalized anxiety disorder: Secondary | ICD-10-CM | POA: Diagnosis not present

## 2015-07-17 DIAGNOSIS — I2510 Atherosclerotic heart disease of native coronary artery without angina pectoris: Secondary | ICD-10-CM | POA: Diagnosis not present

## 2015-09-02 ENCOUNTER — Other Ambulatory Visit: Payer: Self-pay | Admitting: Internal Medicine

## 2015-09-02 ENCOUNTER — Other Ambulatory Visit: Payer: Self-pay

## 2015-09-19 ENCOUNTER — Other Ambulatory Visit: Payer: Self-pay | Admitting: *Deleted

## 2015-09-19 MED ORDER — PANTOPRAZOLE SODIUM 40 MG PO TBEC: 40.0000 mg | DELAYED_RELEASE_TABLET | Freq: Every day | ORAL | Status: DC

## 2015-09-19 MED ORDER — CLOPIDOGREL BISULFATE 75 MG PO TABS: 75.0000 mg | ORAL_TABLET | Freq: Every day | ORAL | Status: DC

## 2015-09-27 ENCOUNTER — Other Ambulatory Visit: Payer: Self-pay | Admitting: *Deleted

## 2015-09-27 MED ORDER — EZETIMIBE 10 MG PO TABS: 10.0000 mg | ORAL_TABLET | Freq: Every day | ORAL | 3 refills | Status: DC

## 2015-10-18 ENCOUNTER — Other Ambulatory Visit: Payer: Self-pay | Admitting: Physician Assistant

## 2015-10-18 DIAGNOSIS — I119 Hypertensive heart disease without heart failure: Secondary | ICD-10-CM

## 2015-10-21 ENCOUNTER — Other Ambulatory Visit: Payer: Self-pay | Admitting: *Deleted

## 2015-10-21 MED ORDER — AMLODIPINE BESYLATE 5 MG PO TABS: 5.0000 mg | ORAL_TABLET | Freq: Every day | ORAL | 6 refills | Status: DC

## 2015-11-07 ENCOUNTER — Other Ambulatory Visit: Payer: Self-pay | Admitting: *Deleted

## 2015-11-07 DIAGNOSIS — I119 Hypertensive heart disease without heart failure: Secondary | ICD-10-CM

## 2015-11-07 MED ORDER — LOSARTAN POTASSIUM 100 MG PO TABS: 50.0000 mg | ORAL_TABLET | Freq: Every day | ORAL | 2 refills | Status: DC

## 2015-11-15 DIAGNOSIS — E782 Mixed hyperlipidemia: Secondary | ICD-10-CM | POA: Diagnosis not present

## 2015-11-15 DIAGNOSIS — I10 Essential (primary) hypertension: Secondary | ICD-10-CM | POA: Diagnosis not present

## 2015-11-19 ENCOUNTER — Other Ambulatory Visit: Payer: Self-pay | Admitting: *Deleted

## 2015-11-19 MED ORDER — HYDROCHLOROTHIAZIDE 25 MG PO TABS: 25.0000 mg | ORAL_TABLET | Freq: Every day | ORAL | 3 refills | Status: DC

## 2015-11-19 MED ORDER — POTASSIUM CHLORIDE CRYS ER 20 MEQ PO TBCR: 20.0000 meq | EXTENDED_RELEASE_TABLET | Freq: Two times a day (BID) | ORAL | 3 refills | Status: DC

## 2015-11-22 DIAGNOSIS — E782 Mixed hyperlipidemia: Secondary | ICD-10-CM | POA: Diagnosis not present

## 2015-11-22 DIAGNOSIS — I259 Chronic ischemic heart disease, unspecified: Secondary | ICD-10-CM | POA: Diagnosis not present

## 2015-11-22 DIAGNOSIS — K219 Gastro-esophageal reflux disease without esophagitis: Secondary | ICD-10-CM | POA: Diagnosis not present

## 2015-11-22 DIAGNOSIS — R001 Bradycardia, unspecified: Secondary | ICD-10-CM | POA: Diagnosis not present

## 2015-11-22 DIAGNOSIS — I6522 Occlusion and stenosis of left carotid artery: Secondary | ICD-10-CM | POA: Diagnosis not present

## 2015-11-22 DIAGNOSIS — R7301 Impaired fasting glucose: Secondary | ICD-10-CM | POA: Diagnosis not present

## 2015-11-22 DIAGNOSIS — I10 Essential (primary) hypertension: Secondary | ICD-10-CM | POA: Diagnosis not present

## 2015-12-07 ENCOUNTER — Encounter (HOSPITAL_COMMUNITY): Payer: Self-pay | Admitting: *Deleted

## 2015-12-07 ENCOUNTER — Emergency Department (HOSPITAL_COMMUNITY): Payer: Medicare Other

## 2015-12-07 ENCOUNTER — Inpatient Hospital Stay (HOSPITAL_COMMUNITY)
Admission: EM | Admit: 2015-12-07 | Discharge: 2015-12-11 | DRG: 066 | Disposition: A | Discharge status: Home / Self Care | Payer: Medicare Other | Attending: Internal Medicine | Admitting: Internal Medicine

## 2015-12-07 DIAGNOSIS — N529 Male erectile dysfunction, unspecified: Secondary | ICD-10-CM | POA: Diagnosis not present

## 2015-12-07 DIAGNOSIS — Z7902 Long term (current) use of antithrombotics/antiplatelets: Secondary | ICD-10-CM | POA: Diagnosis not present

## 2015-12-07 DIAGNOSIS — R51 Headache: Secondary | ICD-10-CM | POA: Diagnosis not present

## 2015-12-07 DIAGNOSIS — Z951 Presence of aortocoronary bypass graft: Secondary | ICD-10-CM

## 2015-12-07 DIAGNOSIS — I6322 Cerebral infarction due to unspecified occlusion or stenosis of basilar arteries: Secondary | ICD-10-CM | POA: Diagnosis not present

## 2015-12-07 DIAGNOSIS — I10 Essential (primary) hypertension: Secondary | ICD-10-CM | POA: Diagnosis present

## 2015-12-07 DIAGNOSIS — Z8719 Personal history of other diseases of the digestive system: Secondary | ICD-10-CM | POA: Diagnosis not present

## 2015-12-07 DIAGNOSIS — R404 Transient alteration of awareness: Secondary | ICD-10-CM | POA: Diagnosis not present

## 2015-12-07 DIAGNOSIS — Z8249 Family history of ischemic heart disease and other diseases of the circulatory system: Secondary | ICD-10-CM | POA: Diagnosis not present

## 2015-12-07 DIAGNOSIS — I619 Nontraumatic intracerebral hemorrhage, unspecified: Secondary | ICD-10-CM | POA: Insufficient documentation

## 2015-12-07 DIAGNOSIS — I700 Atherosclerosis of aorta: Secondary | ICD-10-CM | POA: Diagnosis present

## 2015-12-07 DIAGNOSIS — J449 Chronic obstructive pulmonary disease, unspecified: Secondary | ICD-10-CM | POA: Diagnosis not present

## 2015-12-07 DIAGNOSIS — R410 Disorientation, unspecified: Secondary | ICD-10-CM | POA: Diagnosis not present

## 2015-12-07 DIAGNOSIS — I671 Cerebral aneurysm, nonruptured: Secondary | ICD-10-CM | POA: Diagnosis not present

## 2015-12-07 DIAGNOSIS — I480 Paroxysmal atrial fibrillation: Secondary | ICD-10-CM | POA: Diagnosis not present

## 2015-12-07 DIAGNOSIS — E785 Hyperlipidemia, unspecified: Secondary | ICD-10-CM | POA: Diagnosis not present

## 2015-12-07 DIAGNOSIS — F419 Anxiety disorder, unspecified: Secondary | ICD-10-CM | POA: Diagnosis not present

## 2015-12-07 DIAGNOSIS — I6340 Cerebral infarction due to embolism of unspecified cerebral artery: Secondary | ICD-10-CM | POA: Insufficient documentation

## 2015-12-07 DIAGNOSIS — Z7982 Long term (current) use of aspirin: Secondary | ICD-10-CM | POA: Diagnosis not present

## 2015-12-07 DIAGNOSIS — I25110 Atherosclerotic heart disease of native coronary artery with unstable angina pectoris: Secondary | ICD-10-CM | POA: Diagnosis present

## 2015-12-07 DIAGNOSIS — I729 Aneurysm of unspecified site: Secondary | ICD-10-CM

## 2015-12-07 DIAGNOSIS — Z955 Presence of coronary angioplasty implant and graft: Secondary | ICD-10-CM | POA: Diagnosis not present

## 2015-12-07 DIAGNOSIS — Z79899 Other long term (current) drug therapy: Secondary | ICD-10-CM

## 2015-12-07 DIAGNOSIS — I2510 Atherosclerotic heart disease of native coronary artery without angina pectoris: Secondary | ICD-10-CM | POA: Diagnosis not present

## 2015-12-07 DIAGNOSIS — F329 Major depressive disorder, single episode, unspecified: Secondary | ICD-10-CM | POA: Diagnosis not present

## 2015-12-07 DIAGNOSIS — I639 Cerebral infarction, unspecified: Secondary | ICD-10-CM

## 2015-12-07 DIAGNOSIS — G459 Transient cerebral ischemic attack, unspecified: Secondary | ICD-10-CM

## 2015-12-07 DIAGNOSIS — Z823 Family history of stroke: Secondary | ICD-10-CM

## 2015-12-07 DIAGNOSIS — K219 Gastro-esophageal reflux disease without esophagitis: Secondary | ICD-10-CM | POA: Diagnosis not present

## 2015-12-07 DIAGNOSIS — I252 Old myocardial infarction: Secondary | ICD-10-CM | POA: Diagnosis not present

## 2015-12-07 DIAGNOSIS — Z87891 Personal history of nicotine dependence: Secondary | ICD-10-CM | POA: Diagnosis not present

## 2015-12-07 DIAGNOSIS — I6330 Cerebral infarction due to thrombosis of unspecified cerebral artery: Secondary | ICD-10-CM | POA: Insufficient documentation

## 2015-12-07 LAB — URINALYSIS, ROUTINE W REFLEX MICROSCOPIC
Bilirubin Urine: NEGATIVE
Glucose, UA: NEGATIVE mg/dL
Hgb urine dipstick: NEGATIVE
Ketones, ur: NEGATIVE mg/dL
Leukocytes, UA: NEGATIVE
Nitrite: NEGATIVE
Protein, ur: NEGATIVE mg/dL
Specific Gravity, Urine: 1.01 (ref 1.005–1.030)
pH: 6 (ref 5.0–8.0)

## 2015-12-07 LAB — CBC WITH DIFFERENTIAL/PLATELET
Basophils Absolute: 0 10*3/uL (ref 0.0–0.1)
Basophils Relative: 0 %
Eosinophils Absolute: 0.1 10*3/uL (ref 0.0–0.7)
Eosinophils Relative: 2 %
HCT: 41.6 % (ref 39.0–52.0)
Hemoglobin: 14.4 g/dL (ref 13.0–17.0)
Lymphocytes Relative: 21 %
Lymphs Abs: 1.3 10*3/uL (ref 0.7–4.0)
MCH: 31.9 pg (ref 26.0–34.0)
MCHC: 34.6 g/dL (ref 30.0–36.0)
MCV: 92.2 fL (ref 78.0–100.0)
Monocytes Absolute: 0.6 10*3/uL (ref 0.1–1.0)
Monocytes Relative: 10 %
Neutro Abs: 4.1 10*3/uL (ref 1.7–7.7)
Neutrophils Relative %: 67 %
Platelets: 221 10*3/uL (ref 150–400)
RBC: 4.51 MIL/uL (ref 4.22–5.81)
RDW: 12.5 % (ref 11.5–15.5)
WBC: 6.1 10*3/uL (ref 4.0–10.5)

## 2015-12-07 LAB — COMPREHENSIVE METABOLIC PANEL
ALT: 24 U/L (ref 17–63)
AST: 23 U/L (ref 15–41)
Albumin: 4.3 g/dL (ref 3.5–5.0)
Alkaline Phosphatase: 59 U/L (ref 38–126)
Anion gap: 7 (ref 5–15)
BUN: 10 mg/dL (ref 6–20)
CO2: 27 mmol/L (ref 22–32)
Calcium: 9.3 mg/dL (ref 8.9–10.3)
Chloride: 100 mmol/L — ABNORMAL LOW (ref 101–111)
Creatinine, Ser: 0.9 mg/dL (ref 0.61–1.24)
GFR calc Af Amer: >60 mL/min (ref >60)
GFR calc non Af Amer: >60 mL/min (ref >60)
Glucose, Bld: 107 mg/dL — ABNORMAL HIGH (ref 65–99)
Potassium: 3.7 mmol/L (ref 3.5–5.1)
Sodium: 134 mmol/L — ABNORMAL LOW (ref 135–145)
Total Bilirubin: 0.8 mg/dL (ref 0.3–1.2)
Total Protein: 7.3 g/dL (ref 6.5–8.1)

## 2015-12-07 LAB — GLUCOSE, CAPILLARY
Glucose-Capillary: 81 mg/dL (ref 65–99)
Glucose-Capillary: 86 mg/dL (ref 65–99)

## 2015-12-07 LAB — TROPONIN I: Troponin I: <0.03 ng/mL (ref <0.03)

## 2015-12-07 LAB — CBG MONITORING, ED: Glucose-Capillary: 126 mg/dL — ABNORMAL HIGH (ref 65–99)

## 2015-12-07 MED ORDER — ALPRAZOLAM 0.5 MG PO TABS
0.5000 mg | ORAL_TABLET | Freq: Three times a day (TID) | ORAL | Status: DC | PRN
  Administered 2015-12-07 – 2015-12-11 (×12): 0.5 mg via ORAL
  Filled 2015-12-07 (×12): qty 1

## 2015-12-07 MED ORDER — ASPIRIN EC 81 MG PO TBEC
81.0000 mg | DELAYED_RELEASE_TABLET | Freq: Every day | ORAL | Status: DC
  Administered 2015-12-08: 81 mg via ORAL
  Filled 2015-12-07: qty 1

## 2015-12-07 MED ORDER — ASPIRIN 81 MG PO CHEW
CHEWABLE_TABLET | ORAL | Status: AC
  Administered 2015-12-07: 81 mg
  Filled 2015-12-07: qty 1

## 2015-12-07 MED ORDER — PANTOPRAZOLE SODIUM 40 MG PO TBEC
40.0000 mg | DELAYED_RELEASE_TABLET | Freq: Every day | ORAL | Status: DC
  Administered 2015-12-07 – 2015-12-11 (×5): 40 mg via ORAL
  Filled 2015-12-07 (×5): qty 1

## 2015-12-07 MED ORDER — ACETAMINOPHEN 325 MG PO TABS
650.0000 mg | ORAL_TABLET | ORAL | Status: DC | PRN
  Administered 2015-12-07: 650 mg via ORAL
  Filled 2015-12-07: qty 2

## 2015-12-07 MED ORDER — HYDROCHLOROTHIAZIDE 25 MG PO TABS
25.0000 mg | ORAL_TABLET | Freq: Every day | ORAL | Status: DC
  Administered 2015-12-07 – 2015-12-11 (×5): 25 mg via ORAL
  Filled 2015-12-07 (×5): qty 1

## 2015-12-07 MED ORDER — CLOPIDOGREL BISULFATE 75 MG PO TABS
75.0000 mg | ORAL_TABLET | Freq: Every day | ORAL | Status: DC
  Administered 2015-12-08 – 2015-12-11 (×4): 75 mg via ORAL
  Filled 2015-12-07 (×5): qty 1

## 2015-12-07 MED ORDER — EZETIMIBE 10 MG PO TABS
10.0000 mg | ORAL_TABLET | Freq: Every day | ORAL | Status: DC
  Administered 2015-12-08 – 2015-12-11 (×4): 10 mg via ORAL
  Filled 2015-12-07 (×6): qty 1

## 2015-12-07 MED ORDER — LOSARTAN POTASSIUM 50 MG PO TABS
50.0000 mg | ORAL_TABLET | Freq: Every day | ORAL | Status: DC
  Administered 2015-12-08 – 2015-12-11 (×4): 50 mg via ORAL
  Filled 2015-12-07 (×6): qty 1

## 2015-12-07 MED ORDER — ACETAMINOPHEN 650 MG RE SUPP: 650.0000 mg | RECTAL | Status: DC | PRN

## 2015-12-07 MED ORDER — AMLODIPINE BESYLATE 5 MG PO TABS
5.0000 mg | ORAL_TABLET | Freq: Every day | ORAL | Status: DC
  Administered 2015-12-07 – 2015-12-11 (×5): 5 mg via ORAL
  Filled 2015-12-07 (×5): qty 1

## 2015-12-07 MED ORDER — POTASSIUM CHLORIDE CRYS ER 20 MEQ PO TBCR
20.0000 meq | EXTENDED_RELEASE_TABLET | Freq: Two times a day (BID) | ORAL | Status: DC
  Administered 2015-12-07 – 2015-12-11 (×9): 20 meq via ORAL
  Filled 2015-12-07 (×9): qty 1

## 2015-12-07 MED ORDER — ASPIRIN 325 MG PO TABS
81.0000 mg | ORAL_TABLET | Freq: Every day | ORAL | Status: DC
  Administered 2015-12-07: 81 mg via ORAL

## 2015-12-07 MED ORDER — ISOSORBIDE MONONITRATE ER 30 MG PO TB24
30.0000 mg | ORAL_TABLET | Freq: Every day | ORAL | Status: DC
  Administered 2015-12-08 – 2015-12-11 (×4): 30 mg via ORAL
  Filled 2015-12-07 (×6): qty 1

## 2015-12-07 MED ORDER — ENOXAPARIN SODIUM 40 MG/0.4ML ~~LOC~~ SOLN
40.0000 mg | SUBCUTANEOUS | Status: DC
  Administered 2015-12-07 – 2015-12-10 (×4): 40 mg via SUBCUTANEOUS
  Filled 2015-12-07 (×4): qty 0.4

## 2015-12-07 NOTE — H&P (Signed)
History and Physical  Patient Name: William Elliott     [id number]    DOB: 21-Dec-1940    DOA: 12/07/2015 PCP: [name], MD   Patient coming from: Home     Chief Complaint: Confusion  HPI: William Elliott is a 75 y.o. male with a past medical history significant for CAD s/p PCI in Jan, HTN, COPD, and hx of TIA who presents with transient confusion.  The patient was in his usual state of health until this morning around 0330, his wife went to let the dogs out and when she came back to the bedroom she found him standing by the night stand using his albuterol inhaler. When he got back in bed, she noticed he was confused, asking questions about where his sons lived, where his grandsons were, and she found that he was somewhat disoriented. She checked his blood pressure which she noted was "high" (meaning that the lower number was greater than 60), so she was worried about stroke and brought him to the ER.  He had no slurred speech, no unilateral weakness, no numbness, no difficulty speaking, no loss of consciousness or seizure, no fall. He does take alprazolam 0.5 mg 2-4 times per day for anxiety which he has done for many years (has tried both sertraline and bupropion for anxiety which have both not helped).  ED course: -Afebrile, heart rate 50s and 40s, sinus rhythm, respirations and pulse oximetry normal, slightly hypertensive -Na 134, K 3.7, Cr 0.9, WBC 6.1K, Hgb 14.4 -Troponin negative -CXR and CT head unremarkable -ECG showed sinus bradycardia without ST changes -MRI was not available at [facility], and so [facility] were asked to evaluate for observation and expedited TIA workup at [facility]     Review of systems:  Review of Systems  HENT: Negative for hearing loss and tinnitus.   Eyes: Negative for blurred vision and double vision.  Neurological: Positive for dizziness (off and on in the morning for several weeks). Negative for sensory change, speech change, focal weakness, seizures, loss of  consciousness and weakness.  Psychiatric/Behavioral: Negative for hallucinations and memory loss. The patient is nervous/anxious (chronic).   All other systems reviewed and are negative.        Past Medical History:  Diagnosis Date  . Acute lower GI bleeding 2008  . Anxiety   . Asthma   . Carotid artery disease ([place])    Asymptomatic left carotid bruit  . COPD (chronic obstructive pulmonary disease) ([place])   . Coronary artery disease    a. CABG - 1999 with LIMA-LAD, SVG-DIAG, SVG-OM, SVG-RPDA  b. Cath in setting of NSTEMI 03/18/2015: patent LIMA-LAD, occluded SVG-RCA, occluded SVG-D1, 99% stenosis of SVG-OM treated w/ DES  . Depression   . Dyslipidemia   . Erectile dysfunction   . Essential hypertension   . History of blood transfusion 2008  . History of stroke   . Hyperlipidemia   . Myocardial infarction     Past Surgical History:  Procedure Laterality Date  . CARDIAC CATHETERIZATION    . CARDIAC CATHETERIZATION N/A 03/18/2015   Procedure: Left Heart Cath and Cors/Grafts Angiography;  Surgeon: Leonie Man, MD;  Location: [facility];  Service: Cardiovascular;  Laterality: N/A;  . CARDIAC CATHETERIZATION N/A 03/18/2015   Procedure: Coronary Stent Intervention;  Surgeon: Leonie Man, MD;  Location: [facility];  Service: Cardiovascular;  Laterality: N/A;  . CATARACT EXTRACTION W/ INTRAOCULAR LENS  IMPLANT, BILATERAL Bilateral   . CORONARY ANGIOPLASTY    .  CORONARY ARTERY BYPASS GRAFT  1999  . FRACTURE SURGERY    . HERNIA REPAIR    . [facility]; took bone out of my right hip  . TONSILLECTOMY    . UMBILICAL HERNIA REPAIR  ~ 2000    Social History: Patient lives with his wife.  Patient walks unassisted.  He still drives.  He is a former smoker.    Allergies  Allergen Reactions  . Anoro Ellipta [Umeclidinium-Vilanterol] Other (See Comments)    Cardiac problems  . Stiolto Respimat [Tiotropium Bromide-Olodaterol]  Other (See Comments)    Cardiac problems  . Fenofibrate Other (See Comments)    No energy  . Lipitor [Atorvastatin Calcium] Other (See Comments)    Not known   . Niaspan [Niacin] Other (See Comments)    Not known  . Zocor [Simvastatin] Other (See Comments)    Not known    Family history: family history includes Heart attack in his father; Heart disease in his father; Hypertension in his mother; Stroke in his mother.  Prior to Admission medications   Medication Sig Start Date End Date Taking? Authorizing Provider  Albuterol Sulfate 108 (90 BASE) MCG/ACT AEPB Inhale 2 puffs into the lungs 4 (four) times daily as needed (wheezing and sob).   Yes Historical Provider, MD  ALPRAZolam Duanne Moron) 1 MG tablet TAKE 1 TABLET BY MOUTH FOUR TIMES DAILY 02/26/15  Yes Darlin Coco, MD  amLODipine (NORVASC) 5 MG tablet Take 1 tablet (5 mg total) by mouth daily. 10/21/15  Yes Satira Sark, MD  aspirin 81 MG tablet Take 81 mg by mouth daily.   Yes Historical Provider, MD  clopidogrel (PLAVIX) 75 MG tablet Take 1 tablet (75 mg total) by mouth daily with breakfast. 09/19/15  Yes Satira Sark, MD  ezetimibe (ZETIA) 10 MG tablet Take 1 tablet (10 mg total) by mouth daily. 09/27/15  Yes Satira Sark, MD  fluticasone (FLONASE) 50 MCG/ACT nasal spray USE 2 SPRAYS INTO EACH NOSTRIL DAILY. 11/30/14  Yes Darlin Coco, MD  hydrochlorothiazide (HYDRODIURIL) 25 MG tablet Take 1 tablet (25 mg total) by mouth daily. 11/19/15  Yes Satira Sark, MD  isosorbide mononitrate (IMDUR) 30 MG 24 hr tablet Take 1 tablet (30 mg total) by mouth daily. 03/13/15  Yes Scott Joylene Draft, PA-C  losartan (COZAAR) 100 MG tablet Take 0.5 tablets (50 mg total) by mouth daily. 11/07/15  Yes Satira Sark, MD  nitroGLYCERIN (NITROSTAT) 0.4 MG SL tablet Place 1 tablet (0.4 mg total) under the tongue every 5 (five) minutes as needed for chest pain. 03/19/15  Yes Erma Heritage, PA  omega-3 acid ethyl esters (LOVAZA) 1 g  capsule TAKE 2 CAPSULES BY MOUTH TWICE DAILY AS NEEDED 05/28/15  Yes Darlin Coco, MD  pantoprazole (PROTONIX) 40 MG tablet Take 1 tablet (40 mg total) by mouth daily. 09/19/15  Yes Satira Sark, MD  potassium chloride SA (K-DUR,KLOR-CON) 20 MEQ tablet Take 1 tablet (20 mEq total) by mouth 2 (two) times daily. 11/19/15  Yes Satira Sark, MD  b complex vitamins tablet Take 1 tablet by mouth daily.    Historical Provider, MD  bisoprolol (ZEBETA) 5 MG tablet TAKE 1/2 OF A TABLET BY MOUTH EVERY DAY - PLEASE CUT TABLETS IN HALF FOR PATIENT 10/18/15   Satira Sark, MD  buPROPion (WELLBUTRIN XL) 150 MG 24 hr tablet Take 150 mg by mouth daily.    Historical Provider, MD  Cholecalciferol 5000  UNITS capsule Take 5,000 Units by mouth daily.    Historical Provider, MD  multivitamin [facility]) per tablet Take 1 tablet by mouth daily.      Historical Provider, MD  sertraline (ZOLOFT) 25 MG tablet Take 25 mg by mouth daily.    Historical Provider, MD  vitamin C (ASCORBIC ACID) 500 MG tablet Take 500 mg by mouth daily.    Historical Provider, MD     Physical Exam: BP 147/68   Pulse (!) 44   Temp 98 F (36.7 C) (Oral)   Resp 18   Ht 5\' 10"  (1.778 m)   Wt 86.2 kg (190 lb)   SpO2 96%   BMI 27.26 kg/m  General appearance: Well-developed, elderly adult male, alert and in no acute distress.   Eyes: Anicteric, conjunctiva pink, lids and lashes normal. PERRL.    ENT: No nasal deformity, discharge, epistaxis.  Hearing somewhat diminished. OP moist without lesions.   Dentition normal. Lymph: No cervical, supraclavicular or axillary lymphadenopathy. Skin: Warm and dry.  No jaundice.  No suspicious rashes or lesions. Cardiac: RRR, nl S1-S2, no murmurs appreciated.  Capillary refill is brisk.  JVP normal.  No LE edema.  Radial pulses 2+ and symmetric.  Left carotid bruit. Respiratory: Normal respiratory rate and rhythm.  CTAB without rales or wheezes. GI: Abdomen soft without rigidity.  No TTP. No  ascites, distension, no hepatosplenomegaly.   MSK: No deformities or effusions. Neuro: Pupils are 4 mm and reactive to 3 mm. Extraocular movements are intact, without nystagmus. Cranial nerve 5 is within normal limits. Cranial nerve 7 is symmetrical. Cranial nerve 8 is within normal limits. Cranial nerves 9 and 10 reveal equal palate elevation. Cranial nerve 11 reveals sternocleidomastoid strong. Cranial nerve 12 is midline. I do not note a deficit in motor strength testing in the upper and lower extremities bilaterally with normal motor, tone and bulk. Romberg maneuver is negative for pathology. Finger-to-nose testing is within normal limits. Speech is fluent. Naming is grossly intact. Attention span and concentration are within normal limits.   Psych: The patient is oriented to time, place and person. Behavior appropriate.  Affect normal.  Recall, recent and remote, as well as general fund of knowledge seem within normal limits. No evidence of aural or visual hallucinations or delusions.       Labs on Admission:  I have personally reviewed following labs and imaging studies: CBC:  Recent Labs Lab 12/07/15 0456  WBC 6.1  NEUTROABS 4.1  HGB 14.4  HCT 41.6  MCV 92.2  PLT [id number]   Basic Metabolic Panel:  Recent Labs Lab 12/07/15 0456  NA 134*  K 3.7  CL 100*  CO2 27  GLUCOSE 107*  BUN 10  CREATININE 0.90  CALCIUM 9.3   GFR: Estimated Creatinine Clearance: 75.5 mL/min (by C-G formula based on SCr of 0.9 mg/dL). Liver Function Tests:  Recent Labs Lab 12/07/15 0456  AST 23  ALT 24  ALKPHOS 59  BILITOT 0.8  PROT 7.3  ALBUMIN 4.3   No results for input(s): LIPASE, AMYLASE in the last 168 hours. No results for input(s): AMMONIA in the last 168 hours. Coagulation Profile: No results for input(s): INR, PROTIME in the last 168 hours. Cardiac Enzymes:  Recent Labs Lab 12/07/15 0456  TROPONINI <0.03   BNP (last 3 results) No results for input(s): PROBNP in the  last 8760 hours. HbA1C: No results for input(s): HGBA1C in the last 72 hours. CBG: No results for input(s): GLUCAP in the last  168 hours. Lipid Profile: No results for input(s): CHOL, HDL, LDLCALC, TRIG, CHOLHDL, LDLDIRECT in the last 72 hours. Thyroid Function Tests: No results for input(s): TSH, T4TOTAL, FREET4, T3FREE, THYROIDAB in the last 72 hours. Anemia Panel: No results for input(s): VITAMINB12, FOLATE, FERRITIN, TIBC, IRON, RETICCTPCT in the last 72 hours. Sepsis Labs: Invalid input(s): PROCALCITONIN, LACTICIDVEN No results found for this or any previous visit (from the past 240 hour(s)).    Radiological Exams on Admission: Personally reviewed CXR shows no opacity, CT head report reviewed: Dg Chest 2 View  Result Date: 12/07/2015 CLINICAL DATA:  Confusion.  Headache. EXAM: CHEST  2 VIEW COMPARISON:  Chest radiograph 03/17/2015 FINDINGS: Median sternotomy wires and CABG markers are again seen. No pleural effusion or pneumothorax. No pulmonary edema or focal airspace consolidation. IMPRESSION: No active cardiopulmonary disease. Electronically Signed   By: Ulyses Jarred M.D.   On: 12/07/2015 05:52   Ct Head Wo Contrast  Result Date: 12/07/2015 CLINICAL DATA:  Confusion.  Headache. EXAM: CT HEAD WITHOUT CONTRAST TECHNIQUE: Contiguous axial images were obtained from the base of the skull through the vertex without intravenous contrast. COMPARISON:  None. FINDINGS: Brain: No mass lesion, intraparenchymal hemorrhage or extra-axial collection. No evidence of acute cortical infarct. There is periventricular hypoattenuation compatible with chronic microvascular disease. There is an old left basal ganglia lacunar infarct. Vascular: Marked calcification of the left vertebral artery. Mild internal carotid artery calcification. Skull: Normal visualized skull base, calvarium and extracranial soft tissues. Sinuses/Orbits: No sinus fluid levels or advanced mucosal thickening. No mastoid effusion.  Normal orbits. IMPRESSION: 1. No acute intracranial abnormality. 2. Advanced chronic microvascular disease. Electronically Signed   By: Ulyses Jarred M.D.   On: 12/07/2015 05:51     EKG: Independently reviewed. Rate 50s, sinus rhythm.  No change from previous, no ST changes.  Echocardiogram Jan 2017: EF [id number] Grade 2 diastolic dysfunction. Mild AV disease     Assessment/Plan 1. Possible acute TIA:  This is new.   ABCD score 3, overall low risk, but has had previous TIAs and has a loud LEFT carotid bruit. -Place in telemetry in observation status for expedited MRI and US carotids -Neuro checks, NIHSS per protocol -Antiplatelet with continued Plavix and aspirin 81 mg -Carotid doppler ordered -MRI/MRA brain ordered -Echocardiogram ordered -Consult to Neurology, appreciate recommendations   2. CAD:  -Continue Plavix and aspirin -Continue Zetia  3. HTN:  -Continue Imdur, HCTZ, Losartan, amlodipine  4. Anxiety:  Discussed risks of this medication. -May continue alprazolam as needed  5. GERD:  -Continue PPI        DVT prophylaxis: Lovenox  Code Status: FULL  Family Communication: Wife and sisters at bedsdie  Disposition Plan: Anticipate Stroke work up as above and consult to ancillary services.  Expect discharge within 1 day. Consults called: Neurology are aware of patient Admission status: Telemetry, OBS status        Medical decision making: Patient seen at 6:05 AM on 12/07/2015.  The patient was discussed with Dr. Roxanne Mins and Dr. Nicole Kindred. What exists of the patient's chart was reviewed in depth and summarized above.  Clinical condition: stable.       Edwin Dada [facility] Pager [phone]

## 2015-12-07 NOTE — ED Provider Notes (Signed)
[facility] DEPT Provider Note   CSN: [id number] Arrival date & time: 12/07/15  0438     History   Chief Complaint Chief Complaint  Patient presents with  . Altered Mental Status    HPI William Elliott is a 75 y.o. male.  He has history of coronary artery disease status post bypass and status post stent placement, asthma, stroke. At about 3:30 AM, patient's wife saw him standing by his dresser and using his asthma inhaler. At that time, he was confused and wanted to know where his son and his grandson were. After about 35 minutes, he returned to his normal mental state. He never had any difficulty with weakness or numbness or tingling. Speech was never dysarthric. He denies incontinence and denies pedal lip or tongue. He is concerned that he had a stroke. He does relate a history of 2 strokes in the past.   The history is provided by the patient and the spouse.  Altered Mental Status      Past Medical History:  Diagnosis Date  . Acute lower GI bleeding 2008  . Anxiety   . Asthma   . Carotid artery disease ([place])    Asymptomatic left carotid bruit  . COPD (chronic obstructive pulmonary disease) ([place])   . Coronary artery disease    a. CABG - 1999 with LIMA-LAD, SVG-DIAG, SVG-OM, SVG-RPDA  b. Cath in setting of NSTEMI 03/18/2015: patent LIMA-LAD, occluded SVG-RCA, occluded SVG-D1, 99% stenosis of SVG-OM treated w/ DES  . Depression   . Dyslipidemia   . Erectile dysfunction   . Essential hypertension   . History of blood transfusion 2008  . History of stroke   . Hyperlipidemia   . Myocardial infarction     Patient Active Problem List   Diagnosis Date Noted  . NSTEMI (non-ST elevated myocardial infarction) ([place]) 03/18/2015  . Coronary artery disease involving coronary bypass graft of native heart with unstable angina pectoris ([place])   . ASCVD (arteriosclerotic cardiovascular disease) 03/17/2015  . Crescendo angina ([place]) 03/16/2015  . Essential hypertension 07/27/2014    . Dyspnea 07/26/2014  . Benign hypertensive heart disease without heart failure 08/04/2013  . BPH associated with nocturia 03/17/2011  . Coronary artery disease involving native coronary artery with unstable angina pectoris ([place])   . Hyperlipidemia   . Carotid bruit   . Ischemic heart disease   . Dyslipidemia   . COPD (chronic obstructive pulmonary disease) ([place])     Past Surgical History:  Procedure Laterality Date  . CARDIAC CATHETERIZATION    . CARDIAC CATHETERIZATION N/A 03/18/2015   Procedure: Left Heart Cath and Cors/Grafts Angiography;  Surgeon: Leonie Man, MD;  Location: [facility];  Service: Cardiovascular;  Laterality: N/A;  . CARDIAC CATHETERIZATION N/A 03/18/2015   Procedure: Coronary Stent Intervention;  Surgeon: Leonie Man, MD;  Location: [facility];  Service: Cardiovascular;  Laterality: N/A;  . CATARACT EXTRACTION W/ INTRAOCULAR LENS  IMPLANT, BILATERAL Bilateral   . CORONARY ANGIOPLASTY    . CORONARY ARTERY BYPASS GRAFT  1999  . FRACTURE SURGERY    . HERNIA REPAIR    . [facility]; took bone out of my right hip  . TONSILLECTOMY    . UMBILICAL HERNIA REPAIR  ~ 2000       Home Medications    Prior to Admission medications   Medication Sig Start Date End Date Taking? Authorizing Provider  Albuterol Sulfate 108 (90 BASE) MCG/ACT  AEPB Inhale 2 puffs into the lungs 4 (four) times daily as needed (wheezing and sob).    Historical Provider, MD  ALPRAZolam Duanne Moron) 1 MG tablet TAKE 1 TABLET BY MOUTH FOUR TIMES DAILY 02/26/15   Darlin Coco, MD  amLODipine (NORVASC) 5 MG tablet Take 1 tablet (5 mg total) by mouth daily. 10/21/15   Satira Sark, MD  aspirin 81 MG tablet Take 81 mg by mouth daily.    Historical Provider, MD  b complex vitamins tablet Take 1 tablet by mouth daily.    Historical Provider, MD  bisoprolol (ZEBETA) 5 MG tablet TAKE 1/2 OF A TABLET BY MOUTH EVERY DAY - PLEASE CUT TABLETS  IN HALF FOR PATIENT 10/18/15   Satira Sark, MD  Cholecalciferol 5000 UNITS capsule Take 5,000 Units by mouth daily.    Historical Provider, MD  clopidogrel (PLAVIX) 75 MG tablet Take 1 tablet (75 mg total) by mouth daily with breakfast. 09/19/15   Satira Sark, MD  ezetimibe (ZETIA) 10 MG tablet Take 1 tablet (10 mg total) by mouth daily. 09/27/15   Satira Sark, MD  fluticasone (FLONASE) 50 MCG/ACT nasal spray USE 2 SPRAYS INTO EACH NOSTRIL DAILY. 11/30/14   Darlin Coco, MD  hydrochlorothiazide (HYDRODIURIL) 25 MG tablet Take 1 tablet (25 mg total) by mouth daily. 11/19/15   Satira Sark, MD  isosorbide mononitrate (IMDUR) 30 MG 24 hr tablet Take 1 tablet (30 mg total) by mouth daily. 03/13/15   Liliane Shi, PA-C  losartan (COZAAR) 100 MG tablet Take 0.5 tablets (50 mg total) by mouth daily. 11/07/15   Satira Sark, MD  multivitamin [facility]) per tablet Take 1 tablet by mouth daily.      Historical Provider, MD  nitroGLYCERIN (NITROSTAT) 0.4 MG SL tablet Place 1 tablet (0.4 mg total) under the tongue every 5 (five) minutes as needed for chest pain. 03/19/15   Erma Heritage, PA  omega-3 acid ethyl esters (LOVAZA) 1 g capsule TAKE 2 CAPSULES BY MOUTH TWICE DAILY AS NEEDED 05/28/15   Darlin Coco, MD  pantoprazole (PROTONIX) 40 MG tablet Take 1 tablet (40 mg total) by mouth daily. 09/19/15   Satira Sark, MD  potassium chloride SA (K-DUR,KLOR-CON) 20 MEQ tablet Take 1 tablet (20 mEq total) by mouth 2 (two) times daily. 11/19/15   Satira Sark, MD  vitamin C (ASCORBIC ACID) 500 MG tablet Take 500 mg by mouth daily.    Historical Provider, MD    Family History Family History  Problem Relation Age of Onset  . Heart disease Father   . Heart attack Father   . Hypertension Mother   . Stroke Mother     Social History Social History  Substance Use Topics  . Smoking status: Former Smoker    Years: 10.00    Types: Cigars    Quit date: 03/02/1997  .  Smokeless tobacco: Never Used     Comment: smoked cigars x 10 yrs, never smoked cigarettes  . Alcohol use 0.0 oz/week     Comment: "drank some when I was young; quit in the 1990s"     Allergies   Anoro ellipta [umeclidinium-vilanterol]; Stiolto respimat [tiotropium bromide-olodaterol]; Fenofibrate; Lipitor [atorvastatin calcium]; Niaspan [niacin]; and Zocor [simvastatin]   Review of Systems Review of Systems  All other systems reviewed and are negative.    Physical Exam Updated Vital Signs BP 171/85 (BP Location: Left Arm)   Pulse (!) 54   Temp 98 F (36.7 C) (Oral)  Resp 16   Ht 5\' 10"  (1.778 m)   Wt 190 lb (86.2 kg)   SpO2 98%   BMI 27.26 kg/m   Physical Exam  Nursing note and vitals reviewed.  75 year old male, resting comfortably and in no acute distress. Vital signs are Significant for hypertension and bradycardia. Oxygen saturation is 98%, which is normal. Head is normocephalic and atraumatic. PERRLA, EOMI. Oropharynx is clear. Neck is nontender and supple without adenopathy or JVD. Back is nontender and there is no CVA tenderness. Lungs are clear without rales, wheezes, or rhonchi. Chest is nontender. Heart has regular rate and rhythm without murmur. Abdomen is soft, flat, nontender without masses or hepatosplenomegaly and peristalsis is normoactive. Extremities have no cyanosis or edema, full range of motion is present. Skin is warm and dry without rash. Neurologic: Mental status is normal, cranial nerves are intact, there are no motor or sensory deficits. There is no pronator drift. Wife states mental status is back to his baseline.  ED Treatments / Results  Labs (all labs ordered are listed, but only abnormal results are displayed) Labs Reviewed  COMPREHENSIVE METABOLIC PANEL - Abnormal; Notable for the following:       Result Value   Sodium 134 (*)    Chloride 100 (*)    Glucose, Bld 107 (*)    All other components within normal limits  TROPONIN I    CBC WITH DIFFERENTIAL/PLATELET  URINALYSIS, ROUTINE W REFLEX MICROSCOPIC (NOT AT [facility])    EKG  EKG Interpretation  Date/Time:  Saturday December 07 2015 05:04:12 EDT Ventricular Rate:  51 PR Interval:    QRS Duration: 96 QT Interval:  443 QTC Calculation: 408 R Axis:   -4 Text Interpretation:  Sinus bradycardia Prolonged PR interval When compared with ECG of 03/19/2015, Nonspecific ST and T wave abnormality has resolved Confirmed by [facility]  MD, Kamdon Reisig ([id number]) on 12/07/2015 5:08:18 AM       Radiology Dg Chest 2 View  Result Date: 12/07/2015 CLINICAL DATA:  Confusion.  Headache. EXAM: CHEST  2 VIEW COMPARISON:  Chest radiograph 03/17/2015 FINDINGS: Median sternotomy wires and CABG markers are again seen. No pleural effusion or pneumothorax. No pulmonary edema or focal airspace consolidation. IMPRESSION: No active cardiopulmonary disease. Electronically Signed   By: Ulyses Jarred M.D.   On: 12/07/2015 05:52   Ct Head Wo Contrast  Result Date: 12/07/2015 CLINICAL DATA:  Confusion.  Headache. EXAM: CT HEAD WITHOUT CONTRAST TECHNIQUE: Contiguous axial images were obtained from the base of the skull through the vertex without intravenous contrast. COMPARISON:  None. FINDINGS: Brain: No mass lesion, intraparenchymal hemorrhage or extra-axial collection. No evidence of acute cortical infarct. There is periventricular hypoattenuation compatible with chronic microvascular disease. There is an old left basal ganglia lacunar infarct. Vascular: Marked calcification of the left vertebral artery. Mild internal carotid artery calcification. Skull: Normal visualized skull base, calvarium and extracranial soft tissues. Sinuses/Orbits: No sinus fluid levels or advanced mucosal thickening. No mastoid effusion. Normal orbits. IMPRESSION: 1. No acute intracranial abnormality. 2. Advanced chronic microvascular disease. Electronically Signed   By: Ulyses Jarred M.D.   On: 12/07/2015 05:51    Procedures Procedures  (including critical care time)  Medications Ordered in ED Medications - No data to display   Initial Impression / Assessment and Plan / ED Course  I have reviewed the triage vital signs and the nursing notes.  Pertinent labs & imaging results that were available during my care of the patient were reviewed by me  and considered in my medical decision making (see chart for details).  Clinical Course   Transient episode of confusion. No evidence of stroke on exam today. Description of event is not typical of transient ischemic attack. Consider possibility of postictal state although there was no evidence that he had a seizure. Old records are reviewed confirming coronary stent placement last January. I see no evidence of cerebrovascular workup. Will check screening labs and EEG, anticipate admission for TIA workup.  Chest x-ray and head CT are unremarkable. Laboratory workup is normal. Case is discussed with Dr. Loleta Books of [facility] who agrees to come to the ED to evaluate him.  Final Clinical Impressions(s) / ED Diagnoses   Final diagnoses:  Transient confusion    New Prescriptions New Prescriptions   No medications on file     Delora Fuel, MD [id number]

## 2015-12-07 NOTE — ED Notes (Signed)
Continue to await bed at [facility].  Pt denies any complaints.

## 2015-12-07 NOTE — ED Notes (Signed)
Pt denies any complaints.  Ambulatory to bathroom with standby assist.  Gait steady.

## 2015-12-07 NOTE — ED Notes (Signed)
Pt returned from xray,  

## 2015-12-07 NOTE — ED Notes (Signed)
MD at bedside. 

## 2015-12-07 NOTE — ED Notes (Signed)
Pt states that he woke up this am and was confused, spouse states that pt did not know where he kept his inhaler, was unsure of where their children were, reports that the episode lasted about 30 minutes, on arrival to er, pt alert, able to answer all questions, c/o headache,

## 2015-12-07 NOTE — Consult Note (Addendum)
Neurology Consultation Reason for Consult: Transient confusion Referring Physician: Jearld Shines, A  CC: Transient confusion  History is obtained from: Patient, wife  HPI: William Elliott is a 75 y.o. male who was in his normal state of health until yesterday evening. After he had taken the dog outside, he came back in and began asking where his sons were, which did not make sense in this context. His wife states that he repeated the same question over and over. He also had used his nebulizer, but then went to use it again even though he had ordered previously used it apparently not remembering that he had previously used it. This behavior continued for 30-45 minutes and then gradually improved. He seemed relatively lucid other than the fact that he was asking the same questions over and over.   LKW: Yesterday evening tpa given?: no, resolved symptoms    ROS: A 14 point ROS was performed and is negative except as noted in the HPI.  Past Medical History:  Diagnosis Date  . Acute lower GI bleeding 2008  . Anxiety   . Asthma   . Carotid artery disease ([place])    Asymptomatic left carotid bruit  . COPD (chronic obstructive pulmonary disease) ([place])   . Coronary artery disease    a. CABG - 1999 with LIMA-LAD, SVG-DIAG, SVG-OM, SVG-RPDA  b. Cath in setting of NSTEMI 03/18/2015: patent LIMA-LAD, occluded SVG-RCA, occluded SVG-D1, 99% stenosis of SVG-OM treated w/ DES  . Depression   . Dyslipidemia   . Erectile dysfunction   . Essential hypertension   . History of blood transfusion 2008  . History of stroke   . Hyperlipidemia   . Myocardial infarction      Family History  Problem Relation Age of Onset  . Heart disease Father   . Heart attack Father   . Hypertension Mother   . Stroke Mother      Social History:  reports that he quit smoking about 18 years ago. His smoking use included Cigars. He quit after 10.00 years of use. He has never used smokeless tobacco. He reports that he does  not drink alcohol or use drugs.   Exam: Current vital signs: BP (!) 146/33   Pulse (!) 44   Temp 98 F (36.7 C)   Resp 20   Ht 5\' 10"  (1.778 m)   Wt 86.2 kg (190 lb)   SpO2 97%   BMI 27.26 kg/m  Vital signs in last 24 hours: Temp:  [98 F (36.7 C)-98.8 F (37.1 C)] 98 F (36.7 C) (10/07 1728) Pulse Rate:  [42-64] 44 (10/07 1728) Resp:  [13-20] 20 (10/07 1728) BP: (120-171)/(33-85) 146/33 (10/07 1728) SpO2:  [93 %-99 %] 97 % (10/07 1728) Weight:  [86.2 kg (190 lb)] 86.2 kg (190 lb) (10/07 0459)   Physical Exam  Constitutional: Appears well-developed and well-nourished.  Psych: Affect appropriate to situation Eyes: No scleral injection HENT: No OP obstrucion Head: Normocephalic.  Cardiovascular: Normal rate and regular rhythm.  Respiratory: Effort normal and breath sounds normal to anterior ascultation GI: Soft.  No distension. There is no tenderness.  Skin: WDI  Neuro: Mental Status: Patient is awake, alert, oriented to person, place, month, year, and situation. Patient is able to give a clear and coherent history. No signs of aphasia or neglect Cranial Nerves: II: Visual Fields are full. Pupils are equal, round, and reactive to light.   III,IV, VI: EOMI without ptosis or diploplia.  V: Facial sensation is symmetric to temperature VII:  Facial movement is symmetric.  VIII: hearing is intact to voice X: Uvula elevates symmetrically XI: Shoulder shrug is symmetric. XII: tongue is midline without atrophy or fasciculations.  Motor: Tone is normal. Bulk is normal. 5/5 strength was present in all four extremities.  Sensory: Sensation is symmetric to light touch and temperature in the arms and legs. Cerebellar: FNF and HKS are intact bilaterally   I have reviewed labs in epic and the results pertinent to this consultation are: CMP unremarkable  I have reviewed the images obtained: CT head-chronic microvascular disease.  Impression: 75 year old male with  transient episode of repeated questions. Possibilities include focal seizure, transient global amnesia, less likely TIA. Given his otherwise clear sensorium, I think that delirium is less likely. My suspicion is that the most likely of these is transient global amnesia, but given that he has had strokes in the past, optimizing his stroke risk factors is not unreasonable. An EEG would also be something I would typically order, but this will not be available on Sunday.  Recommendations: 1) MRI brain 2) EEG(could be done as outpatient) 3) continue aspirin and Plavix  4) further recommendations following the above imaging.   Roland Rack, MD [facility] [phone]  If 7pm- 7am, please page neurology on call as listed in [place].

## 2015-12-07 NOTE — ED Notes (Signed)
Report given to [facility] with [facility]

## 2015-12-07 NOTE — ED Notes (Signed)
Patient transported to X-ray 

## 2015-12-07 NOTE — Progress Notes (Signed)
Received from [employer]  transport via Biomedical scientist from [employer]. Patient is alert and oriented; oriented to room and unit routine.

## 2015-12-07 NOTE — Progress Notes (Signed)
Patient seen and evaluated once he arrived from [facility].  Speaking in full sentences, no confusion, symptoms resolved.  Discussed plan of care with patient's wife, sister in law and patient.  All questions were answered. Vital signs WNL. Called Neurology, Dr. Leonel Ramsay, who states they will consult on the patient. History of TIA's previously.  Orders in for MRI/ MRA, carotid ultrasound, Echocardiogram.  Patient passed swallow evaluation.  Will await neurology recommendations.

## 2015-12-07 NOTE — ED Triage Notes (Signed)
Pt c/o feeling confused when he woke up and c/o headache

## 2015-12-08 ENCOUNTER — Encounter (HOSPITAL_COMMUNITY): Payer: Self-pay | Admitting: Radiology

## 2015-12-08 ENCOUNTER — Observation Stay (HOSPITAL_COMMUNITY): Payer: Medicare Other

## 2015-12-08 DIAGNOSIS — Z8719 Personal history of other diseases of the digestive system: Secondary | ICD-10-CM | POA: Diagnosis not present

## 2015-12-08 DIAGNOSIS — Z955 Presence of coronary angioplasty implant and graft: Secondary | ICD-10-CM | POA: Diagnosis not present

## 2015-12-08 DIAGNOSIS — Z79899 Other long term (current) drug therapy: Secondary | ICD-10-CM | POA: Diagnosis not present

## 2015-12-08 DIAGNOSIS — R410 Disorientation, unspecified: Secondary | ICD-10-CM | POA: Diagnosis not present

## 2015-12-08 DIAGNOSIS — Z951 Presence of aortocoronary bypass graft: Secondary | ICD-10-CM | POA: Diagnosis not present

## 2015-12-08 DIAGNOSIS — Z7902 Long term (current) use of antithrombotics/antiplatelets: Secondary | ICD-10-CM | POA: Diagnosis not present

## 2015-12-08 DIAGNOSIS — J449 Chronic obstructive pulmonary disease, unspecified: Secondary | ICD-10-CM | POA: Diagnosis not present

## 2015-12-08 DIAGNOSIS — I729 Aneurysm of unspecified site: Secondary | ICD-10-CM | POA: Diagnosis not present

## 2015-12-08 DIAGNOSIS — I480 Paroxysmal atrial fibrillation: Secondary | ICD-10-CM | POA: Diagnosis not present

## 2015-12-08 DIAGNOSIS — R404 Transient alteration of awareness: Secondary | ICD-10-CM | POA: Diagnosis present

## 2015-12-08 DIAGNOSIS — Z823 Family history of stroke: Secondary | ICD-10-CM | POA: Diagnosis not present

## 2015-12-08 DIAGNOSIS — Z87891 Personal history of nicotine dependence: Secondary | ICD-10-CM | POA: Diagnosis not present

## 2015-12-08 DIAGNOSIS — F419 Anxiety disorder, unspecified: Secondary | ICD-10-CM | POA: Diagnosis present

## 2015-12-08 DIAGNOSIS — K219 Gastro-esophageal reflux disease without esophagitis: Secondary | ICD-10-CM | POA: Diagnosis present

## 2015-12-08 DIAGNOSIS — I6330 Cerebral infarction due to thrombosis of unspecified cerebral artery: Secondary | ICD-10-CM | POA: Insufficient documentation

## 2015-12-08 DIAGNOSIS — I619 Nontraumatic intracerebral hemorrhage, unspecified: Secondary | ICD-10-CM | POA: Insufficient documentation

## 2015-12-08 DIAGNOSIS — I700 Atherosclerosis of aorta: Secondary | ICD-10-CM | POA: Diagnosis present

## 2015-12-08 DIAGNOSIS — I639 Cerebral infarction, unspecified: Secondary | ICD-10-CM | POA: Diagnosis not present

## 2015-12-08 DIAGNOSIS — I2510 Atherosclerotic heart disease of native coronary artery without angina pectoris: Secondary | ICD-10-CM | POA: Diagnosis present

## 2015-12-08 DIAGNOSIS — Z8249 Family history of ischemic heart disease and other diseases of the circulatory system: Secondary | ICD-10-CM | POA: Diagnosis not present

## 2015-12-08 DIAGNOSIS — I6322 Cerebral infarction due to unspecified occlusion or stenosis of basilar arteries: Secondary | ICD-10-CM | POA: Diagnosis present

## 2015-12-08 DIAGNOSIS — F329 Major depressive disorder, single episode, unspecified: Secondary | ICD-10-CM | POA: Diagnosis present

## 2015-12-08 DIAGNOSIS — Z7982 Long term (current) use of aspirin: Secondary | ICD-10-CM | POA: Diagnosis not present

## 2015-12-08 DIAGNOSIS — G459 Transient cerebral ischemic attack, unspecified: Secondary | ICD-10-CM | POA: Diagnosis not present

## 2015-12-08 DIAGNOSIS — I252 Old myocardial infarction: Secondary | ICD-10-CM | POA: Diagnosis not present

## 2015-12-08 DIAGNOSIS — I671 Cerebral aneurysm, nonruptured: Secondary | ICD-10-CM | POA: Diagnosis present

## 2015-12-08 DIAGNOSIS — I6340 Cerebral infarction due to embolism of unspecified cerebral artery: Secondary | ICD-10-CM | POA: Insufficient documentation

## 2015-12-08 DIAGNOSIS — I6789 Other cerebrovascular disease: Secondary | ICD-10-CM | POA: Diagnosis not present

## 2015-12-08 DIAGNOSIS — I10 Essential (primary) hypertension: Secondary | ICD-10-CM | POA: Diagnosis not present

## 2015-12-08 DIAGNOSIS — I63232 Cerebral infarction due to unspecified occlusion or stenosis of left carotid arteries: Secondary | ICD-10-CM | POA: Diagnosis not present

## 2015-12-08 DIAGNOSIS — N529 Male erectile dysfunction, unspecified: Secondary | ICD-10-CM | POA: Diagnosis present

## 2015-12-08 DIAGNOSIS — E785 Hyperlipidemia, unspecified: Secondary | ICD-10-CM | POA: Diagnosis present

## 2015-12-08 LAB — VAS US CAROTID
LEFT ECA DIAS: -12 cm/s
LEFT VERTEBRAL DIAS: -16 cm/s
Left CCA dist dias: 14 cm/s
Left CCA dist sys: 73 cm/s
Left CCA prox dias: 18 cm/s
Left CCA prox sys: 83 cm/s
Left ICA dist dias: -17 cm/s
Left ICA dist sys: -67 cm/s
Left ICA prox dias: -17 cm/s
Left ICA prox sys: -70 cm/s
RIGHT ECA DIAS: -5 cm/s
RIGHT VERTEBRAL DIAS: -6 cm/s
Right CCA prox dias: 13 cm/s
Right CCA prox sys: 91 cm/s
Right cca dist sys: -64 cm/s

## 2015-12-08 LAB — GLUCOSE, CAPILLARY
Glucose-Capillary: 97 mg/dL (ref 65–99)
Glucose-Capillary: 96 mg/dL (ref 65–99)
Glucose-Capillary: 117 mg/dL — ABNORMAL HIGH (ref 65–99)

## 2015-12-08 MED ORDER — ASPIRIN EC 325 MG PO TBEC
325.0000 mg | DELAYED_RELEASE_TABLET | Freq: Every day | ORAL | Status: DC
  Administered 2015-12-09 – 2015-12-11 (×3): 325 mg via ORAL
  Filled 2015-12-08 (×3): qty 1

## 2015-12-08 MED ORDER — IOPAMIDOL (ISOVUE-370) INJECTION 76%
INTRAVENOUS | Status: AC
  Administered 2015-12-08: 50 mL
  Filled 2015-12-08: qty 50

## 2015-12-08 NOTE — Progress Notes (Signed)
STROKE TEAM PROGRESS NOTE   HISTORY OF PRESENT ILLNESS (per record) William Elliott is a 75 y.o. male who was in his normal state of health until yesterday evening. After he had taken the dog outside, he came back in and began asking where his sons were, which did not make sense in this context. His wife states that he repeated the same question over and over. He also had used his nebulizer, but then went to use it again even though he had ordered previously used it apparently not remembering that he had previously used it. This behavior continued for 30-45 minutes and then gradually improved. He seemed relatively lucid other than the fact that he was asking the same questions over and over.   LKW: Yesterday evening tpa given?: no, resolved symptoms   SUBJECTIVE (INTERVAL HISTORY) No family members present. The patient reports he is feeling better. He is oriented and appropriate today. He reports he has been under a lot of stress recently.   OBJECTIVE Temp:  [98 F (36.7 C)-98.3 F (36.8 C)] 98.3 F (36.8 C) (10/08 0927) Pulse Rate:  [43-51] 51 (10/08 0927) Cardiac Rhythm: Heart block (10/08 0700) Resp:  [18-20] 18 (10/08 0927) BP: (130-159)/(33-77) 130/72 (10/08 0927) SpO2:  [96 %-98 %] 98 % (10/08 0927)  CBC:   Recent Labs Lab 12/07/15 0456  WBC 6.1  NEUTROABS 4.1  HGB 14.4  HCT 41.6  MCV 92.2  PLT 221    Basic Metabolic Panel:   Recent Labs Lab 12/07/15 0456  NA 134*  K 3.7  CL 100*  CO2 27  GLUCOSE 107*  BUN 10  CREATININE 0.90  CALCIUM 9.3    Lipid Panel:     Component Value Date/Time   CHOL 176 12/14/2014 1504   TRIG 133 12/14/2014 1504   HDL 32 (L) 12/14/2014 1504   CHOLHDL 5.5 (H) 12/14/2014 1504   VLDL 27 12/14/2014 1504   LDLCALC 117 12/14/2014 1504   HgbA1c: No results found for: HGBA1C Urine Drug Screen: No results found for: LABOPIA, COCAINSCRNUR, LABBENZ, AMPHETMU, THCU, LABBARB    IMAGING  Dg Chest 2 View 12/07/2015 No active  cardiopulmonary disease.    Ct Head Wo Contrast 12/07/2015 1. No acute intracranial abnormality.  2. Advanced chronic microvascular disease.     Mr Maxine Glenn Head/brain Wo Cm 12/08/2015 1. Multiple small acute infarcts, located within the left pons, superior left thalamus and right occipital lobe.   No acute hemorrhage or mass effect.  2. No intracranial arterial occlusion. Diminished flow related enhancement within the basilar artery may indicate a degree of stenosis. This could be further characterized with CTA if clinically indicated.  3. 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  4. Advanced chronic microvascular ischemia.     PHYSICAL EXAM  Constitutional: Appears well-developed and well-nourished.  Psych: Affect appropriate to situation Eyes: No scleral injection HENT: No OP obstrucion Head: Normocephalic.  Cardiovascular: Normal rate and regular rhythm.  Respiratory: Effort normal and breath sounds normal to anterior ascultation GI: Soft.  No distension. There is no tenderness.  Skin: WDI  Neuro: Mental Status: Patient is awake, alert, oriented to person, place, month, year, and situation. Patient is able to give a clear and coherent history. No signs of aphasia or neglect Cranial Nerves: II: Visual Fields are full. Pupils are equal, round, and reactive to light.   III,IV, VI: EOMI without ptosis or diploplia.  V: Facial sensation is symmetric  to temperature VII: Facial movement is symmetric.  VIII: hearing is intact to voice X: Uvula elevates symmetrically XI: Shoulder shrug is symmetric. XII: tongue is midline without atrophy or fasciculations.  Motor: Tone is normal. Bulk is normal. 5/5 strength was present in all four extremities.  Sensory: Sensation is symmetric to light touch and temperature in the arms and legs. Cerebellar: FNF and HKS are intact bilaterally    ASSESSMENT/PLAN [name] is a 75 y.o.  male with history of coronary artery disease with previous MI, previous stroke, hyperlipidemia, hypertension, COPD, hx of carotid artery disease, anxiety, depression, and history of lower GI bleed presenting with altered mental status. He did not receive IV t-PA due to improvement in deficits.  Stroke:  Bilateral - possibly embolic from an undetermined source.  Resultant  No deficits  MRI - Multiple small acute infarcts, located within the left pons, superior left thalamus and right occipital lobe.   MRA - Diminished flow related enhancement within the basilar artery. CTA if clinically indicated.   Carotid Doppler - 1-39% ICA stenosis, Left > Right.  Vertebral artery flow is antegrade.   2D Echo - pending  LDL - will order  HgbA1c - will order  VTE prophylaxis - Lovenox Diet Heart Room service appropriate? Yes; Fluid consistency: Thin  aspirin 81 mg daily and clopidogrel 75 mg daily prior to admission, now on aspirin 81 mg daily and clopidogrel 75 mg daily  Patient counseled to be compliant with his antithrombotic medications  Ongoing aggressive stroke risk factor management  Therapy recommendations: pending  Disposition:  Pending  Hypertension  Stable  Permissive hypertension (OK if < 220/120) but gradually normalize in 5-7 days  Long-term BP goal normotensive  Hyperlipidemia  Home meds:  Zetia 10 mg daily resumed in hospital  LDL pending, goal < 70  Await labs  Continue statin at discharge    Other Stroke Risk Factors  Advanced age  Former smoker - quit 18 years ago  Hx stroke/TIA  Family hx stroke (mother)  Coronary artery disease   Other Active Problems  Right MCA bifurcation aneurysm, 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   Hyponatremia - 134   PLAN  CTA head and neck  Per Dr. Leonie Man - increase aspirin to 325 mg daily.  Hospital day # 0  Mikey Bussing PA-C [facility] Pager [phone] 12/08/2015,  4:26 PM  I have personally examined this patient, reviewed notes, independently viewed imaging studies, participated in medical decision making and plan of care.ROS completed by me personally and pertinent positives fully documented  I have made any additions or clarifications directly to the above note. Agree with note above. He presented with transient confusion due to multiple small lacunar infarcts likely from small vessel disease and needs ongoing evaluation and aggressive risk factor control.Increase aspirin dose to 325 mg daily.Greater than 50% time during this 25 minute visit was spent on counselling and coordination of care about stroke risk, prevention and treatment.  Antony Contras, MD Medical Director [facility] Pager: [phone] 12/08/2015 6:12 PM  To contact Stroke Continuity provider, please refer to [email or web address] After hours, contact General Neurology

## 2015-12-08 NOTE — Progress Notes (Signed)
PROGRESS NOTE    William Elliott  [id number] DOB: Feb 01, 1941 DOA: 12/07/2015 PCP: [name], MD    Brief Narrative:  William Elliott is a 75 y.o. male with a past medical history significant for CAD s/p PCI in Jan, HTN, COPD, and hx of TIA who presents with transient confusion.  The patient was in his usual state of health until this morning around 0330, his wife went to let the dogs out and when she came back to the bedroom she found him standing by the night stand using his albuterol inhaler. When he got back in bed, she noticed he was confused, asking questions about where his sons lived, where his grandsons were, and she found that he was somewhat disoriented. She checked his blood pressure which she noted was "high" (meaning that the lower number was greater than 60), so she was worried about stroke and brought him to the ER.  He had no slurred speech, no unilateral weakness, no numbness, no difficulty speaking, no loss of consciousness or seizure, no fall. He does take alprazolam 0.5 mg 2-4 times per day for anxiety which he has done for many years (has tried both sertraline and bupropion for anxiety which have both not helped).  ED course: -Afebrile, heart rate 50s and 40s, sinus rhythm, respirations and pulse oximetry normal, slightly hypertensive -Na 134, K 3.7, Cr 0.9, WBC 6.1K, Hgb 14.4 -Troponin negative -CXR and CT head unremarkable -ECG showed sinus bradycardia without ST changes -MRI was not available at [facility], and so [facility] were asked to evaluate for observation and expedited TIA workup at [facility]   Assessment & Plan:   Principal Problem:   Confusion Active Problems:   Coronary artery disease involving native coronary artery with unstable angina pectoris (HCC)   COPD (chronic obstructive pulmonary disease) ([place])   Essential hypertension   Cerebral thrombosis with cerebral infarction   Cerebral embolism with cerebral infarction   Intracerebral hemorrhage   Possible  acute TIA:  - loud LEFT carotid bruit. -Place in telemetry -Neuro checks, NIHSS per protocol - Plavix and aspirin 81 mg -Carotid doppler: Bilateral: mild to moderate calcific plaque CCA. Moderate calcific plaque origin and proximal ICA and ECA. Right: 1-39% ICA plaquing. Left: 1-39% ICA stenosis at the bulb. Bilateral vertebral artery flow is antegrade. -MRI/MRA showed CVA and small embolism -Echocardiogram pending -Consult to Neurology, appreciate recommendations   2. CAD:  -Continue Plavix and aspirin -Continue Zetia  3. HTN:  - Imdur, HCTZ, Losartan, amlodipine continued  4. Anxiety:  -May continue alprazolam as needed  5. GERD:  -Continue PPI   DVT prophylaxis: Lovenox Code Status: Full code Family Communication: no family at bedside Disposition Plan: can consider discharge once workup is complete   Consultants:   Neurology  Procedures:   Echo pending  Antimicrobials:   none    Subjective: Patient in bed at time of evaluation.  He voices that he is concerned about starting Plavix again as he was previously on it years ago and needed 7 units of blood because he started bleeding.  He also mentions he is worried about taking an aspirin because he has a Mallory Weiss tear in his stomach and he is worried he will start bleeding through that. He wants to ensure that all caregivers know of his previous bleeds.  Objective: Vitals:   12/07/15 2320 12/08/15 0120 12/08/15 0320 12/08/15 0927  BP: 132/60 (!) 139/47 (!) 159/77 130/72  Pulse: (!) 50 (!) 43 (!) 49 (!) 51  Resp: 18 18 18 18   Temp: 98.3 F (36.8 C) 98.3 F (36.8 C) 98.3 F (36.8 C) 98.3 F (36.8 C)  TempSrc: Oral Oral Oral Oral  SpO2: 97% 96% 96% 98%  Weight:      Height:       No intake or output data in the 24 hours ending 12/08/15 1559 Filed Weights   12/07/15 0459  Weight: 86.2 kg (190 lb)    Examination:  General exam: Appears calm and comfortable  Respiratory system: Clear to  auscultation. Respiratory effort normal. Cardiovascular system: S1 & S2 heard, RRR. No JVD, murmurs, rubs, gallops or clicks. No pedal edema. Gastrointestinal system: Abdomen is nondistended, soft and nontender. No organomegaly or masses felt. Normal bowel sounds heard. Central nervous system: Alert and oriented. No focal neurological deficits. Extremities: Symmetric 5 x 5 power. NO deformities, clubbing or cyanosis Skin: No rashes, lesions or ulcers Psychiatry: Judgement and insight appear normal. Mood & affect appropriate.     Data Reviewed: I have personally reviewed following labs and imaging studies  CBC:  Recent Labs Lab 12/07/15 0456  WBC 6.1  NEUTROABS 4.1  HGB 14.4  HCT 41.6  MCV 92.2  PLT [id number]   Basic Metabolic Panel:  Recent Labs Lab 12/07/15 0456  NA 134*  K 3.7  CL 100*  CO2 27  GLUCOSE 107*  BUN 10  CREATININE 0.90  CALCIUM 9.3   GFR: Estimated Creatinine Clearance: 75.5 mL/min (by C-G formula based on SCr of 0.9 mg/dL). Liver Function Tests:  Recent Labs Lab 12/07/15 0456  AST 23  ALT 24  ALKPHOS 59  BILITOT 0.8  PROT 7.3  ALBUMIN 4.3   No results for input(s): LIPASE, AMYLASE in the last 168 hours. No results for input(s): AMMONIA in the last 168 hours. Coagulation Profile: No results for input(s): INR, PROTIME in the last 168 hours. Cardiac Enzymes:  Recent Labs Lab 12/07/15 0456  TROPONINI <0.03   BNP (last 3 results) No results for input(s): PROBNP in the last 8760 hours. HbA1C: No results for input(s): HGBA1C in the last 72 hours. CBG:  Recent Labs Lab 12/07/15 1151 12/07/15 1654 12/07/15 2120 12/08/15 0615 12/08/15 1135  GLUCAP 126* 81 86 97 96   Lipid Profile: No results for input(s): CHOL, HDL, LDLCALC, TRIG, CHOLHDL, LDLDIRECT in the last 72 hours. Thyroid Function Tests: No results for input(s): TSH, T4TOTAL, FREET4, T3FREE, THYROIDAB in the last 72 hours. Anemia Panel: No results for input(s): VITAMINB12,  FOLATE, FERRITIN, TIBC, IRON, RETICCTPCT in the last 72 hours. Sepsis Labs: No results for input(s): PROCALCITON, LATICACIDVEN in the last 168 hours.  No results found for this or any previous visit (from the past 240 hour(s)).       Radiology Studies: Dg Chest 2 View  Result Date: 12/07/2015 CLINICAL DATA:  Confusion.  Headache. EXAM: CHEST  2 VIEW COMPARISON:  Chest radiograph 03/17/2015 FINDINGS: Median sternotomy wires and CABG markers are again seen. No pleural effusion or pneumothorax. No pulmonary edema or focal airspace consolidation. IMPRESSION: No active cardiopulmonary disease. Electronically Signed   By: Ulyses Jarred M.D.   On: 12/07/2015 05:52   Ct Head Wo Contrast  Result Date: 12/07/2015 CLINICAL DATA:  Confusion.  Headache. EXAM: CT HEAD WITHOUT CONTRAST TECHNIQUE: Contiguous axial images were obtained from the base of the skull through the vertex without intravenous contrast. COMPARISON:  None. FINDINGS: Brain: No mass lesion, intraparenchymal hemorrhage or extra-axial collection. No evidence of acute cortical infarct. There is periventricular hypoattenuation compatible with  chronic microvascular disease. There is an old left basal ganglia lacunar infarct. Vascular: Marked calcification of the left vertebral artery. Mild internal carotid artery calcification. Skull: Normal visualized skull base, calvarium and extracranial soft tissues. Sinuses/Orbits: No sinus fluid levels or advanced mucosal thickening. No mastoid effusion. Normal orbits. IMPRESSION: 1. No acute intracranial abnormality. 2. Advanced chronic microvascular disease. Electronically Signed   By: Ulyses Jarred M.D.   On: 12/07/2015 05:51   Mr Brain Wo Contrast  Result Date: 12/08/2015 CLINICAL DATA:  Altered mental status.  Suspected TIA. EXAM: MRI HEAD WITHOUT CONTRAST MRA HEAD WITHOUT CONTRAST TECHNIQUE: Multiplanar, multiecho pulse sequences of the brain and surrounding structures were obtained without  intravenous contrast. Angiographic images of the head were obtained using MRA technique without contrast. COMPARISON:  Head CT 12/07/2015 FINDINGS: MRI HEAD FINDINGS Brain: There are multiple focal areas of diffusion restriction, located within the left pons, right occipital lobe and superior left thalamus. The midline structures are normal. There is confluent hyperintense T2-weighted signal within the periventricular, deep and subcortical white matter, most often seen in the setting of chronic microvascular ischemia. No mass lesion or midline shift. No hydrocephalus or extra-axial fluid collection. Vascular: Major intracranial arterial and venous sinus flow voids are preserved. There are a few scattered punctate foci of susceptibility consistent with chronic microhemorrhage, in a nonspecific distribution. Skull and upper cervical spine: The visualized skull base, calvarium, upper cervical spine and extracranial soft tissues are normal. Sinuses/Orbits: No fluid levels or advanced mucosal thickening. No mastoid effusion. Normal orbits. MRA HEAD FINDINGS Intracranial internal carotid arteries: Normal. Anterior cerebral arteries: Normal. Middle cerebral arteries: There is an aneurysm at the right MCA bifurcation, which measures 2 mm at its base and 4 mm from base to apex. The middle cerebral arteries are otherwise normal without high-grade stenosis or occlusion. Posterior communicating arteries: Absent bilaterally. Posterior cerebral arteries: Normal. Basilar artery: There is diminished flow related enhancement within the basilar artery proximally. Vertebral arteries: Left dominant. The right vertebral artery terminates in PICA. Superior cerebellar arteries: Normal. Anterior inferior cerebellar arteries: Not visualized, which is not uncommon. Posterior inferior cerebellar arteries: Normal. IMPRESSION: 1. Multiple small acute infarcts, located within the left pons, superior left thalamus and right occipital lobe. No  acute hemorrhage or mass effect. 2. No intracranial arterial occlusion. Diminished flow related enhancement within the basilar artery may indicate a degree of stenosis. This could be further characterized with CTA if clinically indicated. 3. 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 4. Advanced chronic microvascular ischemia. Electronically Signed   By: Ulyses Jarred M.D.   On: 12/08/2015 05:50   [name] Head/brain [id number] Cm  Result Date: 12/08/2015 CLINICAL DATA:  Altered mental status.  Suspected TIA. EXAM: MRI HEAD WITHOUT CONTRAST MRA HEAD WITHOUT CONTRAST TECHNIQUE: Multiplanar, multiecho pulse sequences of the brain and surrounding structures were obtained without intravenous contrast. Angiographic images of the head were obtained using MRA technique without contrast. COMPARISON:  Head CT 12/07/2015 FINDINGS: MRI HEAD FINDINGS Brain: There are multiple focal areas of diffusion restriction, located within the left pons, right occipital lobe and superior left thalamus. The midline structures are normal. There is confluent hyperintense T2-weighted signal within the periventricular, deep and subcortical white matter, most often seen in the setting of chronic microvascular ischemia. No mass lesion or midline shift. No hydrocephalus or extra-axial fluid collection. Vascular: Major intracranial arterial and venous sinus flow voids are preserved. There are a few scattered punctate  foci of susceptibility consistent with chronic microhemorrhage, in a nonspecific distribution. Skull and upper cervical spine: The visualized skull base, calvarium, upper cervical spine and extracranial soft tissues are normal. Sinuses/Orbits: No fluid levels or advanced mucosal thickening. No mastoid effusion. Normal orbits. MRA HEAD FINDINGS Intracranial internal carotid arteries: Normal. Anterior cerebral arteries: Normal. Middle cerebral arteries: There is an aneurysm at the right MCA  bifurcation, which measures 2 mm at its base and 4 mm from base to apex. The middle cerebral arteries are otherwise normal without high-grade stenosis or occlusion. Posterior communicating arteries: Absent bilaterally. Posterior cerebral arteries: Normal. Basilar artery: There is diminished flow related enhancement within the basilar artery proximally. Vertebral arteries: Left dominant. The right vertebral artery terminates in PICA. Superior cerebellar arteries: Normal. Anterior inferior cerebellar arteries: Not visualized, which is not uncommon. Posterior inferior cerebellar arteries: Normal. IMPRESSION: 1. Multiple small acute infarcts, located within the left pons, superior left thalamus and right occipital lobe. No acute hemorrhage or mass effect. 2. No intracranial arterial occlusion. Diminished flow related enhancement within the basilar artery may indicate a degree of stenosis. This could be further characterized with CTA if clinically indicated. 3. 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 4. Advanced chronic microvascular ischemia. Electronically Signed   By: Ulyses Jarred M.D.   On: 12/08/2015 05:50        Scheduled Meds: . amLODipine  5 mg Oral Daily  . aspirin EC  81 mg Oral Daily  . clopidogrel  75 mg Oral Q breakfast  . enoxaparin (LOVENOX) injection  40 mg Subcutaneous Q24H  . ezetimibe  10 mg Oral Daily  . hydrochlorothiazide  25 mg Oral Daily  . isosorbide mononitrate  30 mg Oral Daily  . losartan  50 mg Oral Daily  . pantoprazole  40 mg Oral Daily  . potassium chloride SA  20 mEq Oral BID   Continuous Infusions:    LOS: 0 days    Time spent: 35 minutes    Newman Pies, MD [facility] Pager [phone]  If 7PM-7AM, please contact night-coverage [email or web address] Password [email or web address] 12/08/2015, 3:59 PM

## 2015-12-08 NOTE — Progress Notes (Signed)
VASCULAR LAB PRELIMINARY  PRELIMINARY  PRELIMINARY  PRELIMINARY  Carotid duplex completed.    Preliminary report:  1-39% ICA stenosis, Left > Right.  Vertebral artery flow is antegrade.   William Elliott, RVT 12/08/2015, 11:20 AM   ;

## 2015-12-08 NOTE — Progress Notes (Signed)
Pt refused to have his CBG checked; verified by Marcello Moores NT and [place] RN

## 2015-12-08 NOTE — Progress Notes (Deleted)
Cardiology Office Note  Date: 12/08/2015   William Elliott, DOB 12/31/1940, MRN [id number]  PCP: [name], MD  Primary Cardiologist: [name], MD   No chief complaint on file.   History of Present Illness: William Elliott is a 75 y.o. male last seen in April.  Past Medical History:  Diagnosis Date  . Acute lower GI bleeding 2008  . Anxiety   . Asthma   . Carotid artery disease ([place])    Asymptomatic left carotid bruit  . COPD (chronic obstructive pulmonary disease) ([place])   . Coronary artery disease    a. CABG - 1999 with LIMA-LAD, SVG-DIAG, SVG-OM, SVG-RPDA  b. Cath in setting of NSTEMI 03/18/2015: patent LIMA-LAD, occluded SVG-RCA, occluded SVG-D1, 99% stenosis of SVG-OM treated w/ DES  . Depression   . Dyslipidemia   . Erectile dysfunction   . Essential hypertension   . History of blood transfusion 2008  . History of stroke   . Hyperlipidemia   . Myocardial infarction     Past Surgical History:  Procedure Laterality Date  . CARDIAC CATHETERIZATION    . CARDIAC CATHETERIZATION N/A 03/18/2015   Procedure: Left Heart Cath and Cors/Grafts Angiography;  Surgeon: Leonie Man, MD;  Location: [facility];  Service: Cardiovascular;  Laterality: N/A;  . CARDIAC CATHETERIZATION N/A 03/18/2015   Procedure: Coronary Stent Intervention;  Surgeon: Leonie Man, MD;  Location: [facility];  Service: Cardiovascular;  Laterality: N/A;  . CATARACT EXTRACTION W/ INTRAOCULAR LENS  IMPLANT, BILATERAL Bilateral   . CORONARY ANGIOPLASTY    . CORONARY ARTERY BYPASS GRAFT  1999  . FRACTURE SURGERY    . HERNIA REPAIR    . [facility]; took bone out of my right hip  . TONSILLECTOMY    . UMBILICAL HERNIA REPAIR  ~ 2000    No current facility-administered medications for this visit.    No current outpatient prescriptions on file.   Facility-Administered Medications Ordered in Other Visits  Medication Dose Route  Frequency Provider Last Rate Last Dose  . acetaminophen (TYLENOL) tablet 650 mg  650 mg Oral Q4H PRN Edwin Dada, MD   650 mg at 12/07/15 [id number]   Or  . acetaminophen (TYLENOL) suppository 650 mg  650 mg Rectal Q4H PRN Edwin Dada, MD      . ALPRAZolam Duanne Moron) tablet 0.5 mg  0.5 mg Oral TID PRN Edwin Dada, MD   0.5 mg at 12/08/15 1314  . amLODipine (NORVASC) tablet 5 mg  5 mg Oral Daily Edwin Dada, MD   5 mg at 12/08/15 0835  . [START ON 12/09/2015] aspirin EC tablet 325 mg  325 mg Oral Daily David L Rinehuls, PA-C      . clopidogrel (PLAVIX) tablet 75 mg  75 mg Oral Q breakfast Edwin Dada, MD   75 mg at 12/08/15 0835  . enoxaparin (LOVENOX) injection 40 mg  40 mg Subcutaneous Q24H Edwin Dada, MD   40 mg at 12/08/15 1100  . ezetimibe (ZETIA) tablet 10 mg  10 mg Oral Daily Edwin Dada, MD   10 mg at 12/08/15 0835  . hydrochlorothiazide (HYDRODIURIL) tablet 25 mg  25 mg Oral Daily Edwin Dada, MD   25 mg at 12/08/15 0836  . isosorbide mononitrate (IMDUR) 24 hr tablet 30 mg  30 mg Oral Daily Edwin Dada, MD   30 mg at 12/08/15 0836  .  losartan (COZAAR) tablet 50 mg  50 mg Oral Daily Edwin Dada, MD   50 mg at 12/08/15 0836  . pantoprazole (PROTONIX) EC tablet 40 mg  40 mg Oral Daily Edwin Dada, MD   40 mg at 12/08/15 0836  . potassium chloride SA (K-DUR,KLOR-CON) CR tablet 20 mEq  20 mEq Oral BID Edwin Dada, MD   20 mEq at 12/08/15 [id number]   Allergies:  Anoro ellipta [umeclidinium-vilanterol]; Stiolto respimat [tiotropium bromide-olodaterol]; Fenofibrate; Lipitor [atorvastatin calcium]; Niaspan [niacin]; and Zocor [simvastatin]   Social History: The patient  reports that he quit smoking about 18 years ago. His smoking use included Cigars. He quit after 10.00 years of use. He has never used smokeless tobacco. He reports that he does not drink alcohol or use drugs.   Family History:  The patient's family history includes Heart attack in his father; Heart disease in his father; Hypertension in his mother; Stroke in his mother.   ROS:  Please see the history of present illness. Otherwise, complete review of systems is positive for {NONE DEFAULTED:18576::"none"}.  All other systems are reviewed and negative.   Physical Exam: VS:  There were no vitals taken for this visit., BMI There is no height or weight on file to calculate BMI.  Wt Readings from Last 3 Encounters:  12/07/15 190 lb (86.2 kg)  06/10/15 201 lb 3.2 oz (91.3 kg)  03/25/15 206 lb 1.9 oz (93.5 kg)    General: Patient appears comfortable at rest. HEENT: Conjunctiva and lids normal, oropharynx clear. Neck: Supple, no elevated JVP or carotid bruits, no thyromegaly. Lungs: Clear to auscultation, nonlabored breathing at rest. Cardiac: Regular rate and rhythm, no S3, soft systolic murmur, no pericardial rub. Abdomen: Soft, nontender, bowel sounds present, no guarding or rebound. Extremities: Trace leg edema, distal pulses 2+. Skin: Warm and dry. Musculoskeletal: No kyphosis. Neuropsychiatric: Alert and oriented x3, affect grossly appropriate.  ECG: I personally reviewed the tracing from 12/07/2015 which showed sinus bradycardia with prolonged PR interval.  Recent Labwork: 12/07/2015: ALT 24; AST 23; BUN 10; Creatinine, Ser 0.90; Hemoglobin 14.4; Platelets 221; Potassium 3.7; Sodium 134     Component Value Date/Time   CHOL 176 12/14/2014 1504   TRIG 133 12/14/2014 1504   HDL 32 (L) 12/14/2014 1504   CHOLHDL 5.5 (H) 12/14/2014 1504   VLDL 27 12/14/2014 1504   LDLCALC 117 12/14/2014 1504   LDLDIRECT 117.0 04/20/2014 1303    Other Studies Reviewed Today:  Cardiac catheterization 03/18/2015:    Conclusion    1. Prox LAD to Mid LAD lesion, 95% stenosed. Mid LAD lesion, 100% stenosed. Ost 2nd Diag to 2nd Diag lesion, 80% stenosed. 2. Ost Cx to Prox Cx lesion, 100% stenosed. 3. Mid RCA to Dist RCA lesion,  100% stenosed. Post Atrio lesion, 100% stenosed. 4. LIMA was injected is large. JR4 Catheter used 5. SVG-RCA & SVG-DIAG 100% occluded 6. Prox Graft lesion SVG-OM, 99% stenosed. Very large - TIMI 1 flow to graft insertion, but no flow in the OM. 7. Following PCI of SVG-OM with distal protection device using Synergy DES, Post intervention, there is a 0% residual stenosis. 8. Mid Cx to Dist Cx lesion, 65% stenosed - not identified until post PCI   Overall severe native coronary artery disease with occluded native mid LAD, proximal circumflex and mid RCA. Patent LIMA-LAD, but 2 of 3 vein grafts 100% occluded (SVG-RCA and SVG-diagonal). - 99% proximal stenosis of SVG-OM treated with DES stent. Initially there was concern basilar patient  having history of Mallory-Weiss tear with severe GI bleed 8 years ago. I confirmed with Dr. Mare Ferrari, this should not be an issue being so far distant to that event. The plan is to use Plavix, but based on the length of the lesion, I felt it best to be using a drug-eluting stent Relatively difficult procedure due to the extent of thrombus and debris in the vessel with TIMI 1 flow pre-PCI.  Overall successful PCI of the SVG-OM1 and TIMI-3 flow.  Plan:  Monitor overnight in the postprocedure unit on 6 [facility].  Sheath removal with manual pressure for hemostasis  I would continue Plavix for minimum of 6 months, preferably one year. Would likely be fine stopping Plavix after 3 months.  Continue other cardiac treatment. He has a severely elevated LVEDP and may require diuresis prior to discharge.   Echocardiogram 03/14/2015: Study Conclusions  - Left ventricle: The cavity size was normal. Wall thickness was  normal. Systolic function was normal. The estimated ejection  fraction was in the range of 60% to 65%. Wall motion was normal;  there were no regional wall motion abnormalities. Features are  consistent with a pseudonormal left ventricular filling  pattern,  with concomitant abnormal relaxation and increased filling  pressure (grade 2 diastolic dysfunction). Doppler parameters are  consistent with high ventricular filling pressure. - Aortic valve: Mildly calcified annulus. Trileaflet; mildly  thickened leaflets. Valve area (VTI): 2.17 cm^2. Valve area  (Vmax): 2.04 cm^2. - Mitral valve: Mildly calcified annulus. Mildly thickened leaflets  . There was mild regurgitation. - Left atrium: The atrium was severely dilated. - Technically adequate study.  Assessment and Plan:    Current medicines were reviewed with the patient today.  No orders of the defined types were placed in this encounter.   Disposition:  Signed, Satira Sark, MD, [facility] 12/08/2015 5:53 PM    [facility] [place], [place], [place] [address] Phone: [phone]; Fax: [phone]

## 2015-12-09 ENCOUNTER — Ambulatory Visit: Payer: Medicare Other | Admitting: Cardiology

## 2015-12-09 ENCOUNTER — Other Ambulatory Visit (HOSPITAL_COMMUNITY): Payer: Medicare Other

## 2015-12-09 ENCOUNTER — Inpatient Hospital Stay (HOSPITAL_COMMUNITY): Payer: Medicare Other

## 2015-12-09 DIAGNOSIS — I6789 Other cerebrovascular disease: Secondary | ICD-10-CM

## 2015-12-09 DIAGNOSIS — I729 Aneurysm of unspecified site: Secondary | ICD-10-CM

## 2015-12-09 DIAGNOSIS — R410 Disorientation, unspecified: Secondary | ICD-10-CM

## 2015-12-09 DIAGNOSIS — I6330 Cerebral infarction due to thrombosis of unspecified cerebral artery: Secondary | ICD-10-CM

## 2015-12-09 LAB — LIPID PANEL
Cholesterol: 176 mg/dL (ref 0–200)
HDL: 30 mg/dL — ABNORMAL LOW (ref >40)
LDL Cholesterol: 115 mg/dL — ABNORMAL HIGH (ref 0–99)
Total CHOL/HDL Ratio: 5.9 RATIO
Triglycerides: 157 mg/dL — ABNORMAL HIGH (ref <150)
VLDL: 31 mg/dL (ref 0–40)

## 2015-12-09 LAB — ECHOCARDIOGRAM COMPLETE
Height: 70 in
Weight: 3040 oz

## 2015-12-09 MED FILL — Aspirin Chew Tab 81 MG: ORAL | Qty: 2 | Status: AC

## 2015-12-09 NOTE — Progress Notes (Signed)
STROKE TEAM PROGRESS NOTE   SUBJECTIVE (INTERVAL HISTORY) No family members present. The patient reports he is feeling symptoms resolved. TTE pending. He stated that in the past he was started on plavix and then he experienced severe GIB and he had to receive 7 units PRBC for anemia. However, later he was put on DAPT for CAD s/p stent, he was doing well but not able to advance ASA from 81 to 325 due to fear of GIB. This time he was put on ASA 325mg  along with plavix so far tolerating well.   OBJECTIVE Temp:  [97.4 F (36.3 C)-98.4 F (36.9 C)] 98 F (36.7 C) (10/09 1018) Pulse Rate:  [46-59] 59 (10/09 1018) Cardiac Rhythm: Heart block (10/09 0700) Resp:  [18] 18 (10/09 1018) BP: (118-153)/(61-75) 118/67 (10/09 1018) SpO2:  [98 %-99 %] 99 % (10/09 1018)  CBC:   Recent Labs Lab 12/07/15 0456  WBC 6.1  NEUTROABS 4.1  HGB 14.4  HCT 41.6  MCV 92.2  PLT [id number]    Basic Metabolic Panel:   Recent Labs Lab 12/07/15 0456  NA 134*  K 3.7  CL 100*  CO2 27  GLUCOSE 107*  BUN 10  CREATININE 0.90  CALCIUM 9.3    Lipid Panel:     Component Value Date/Time   CHOL 176 12/09/2015 0616   TRIG 157 (H) 12/09/2015 0616   HDL 30 (L) 12/09/2015 0616   CHOLHDL 5.9 12/09/2015 0616   VLDL 31 12/09/2015 0616   LDLCALC 115 (H) 12/09/2015 0616   HgbA1c: No results found for: HGBA1C Urine Drug Screen: No results found for: LABOPIA, COCAINSCRNUR, LABBENZ, AMPHETMU, THCU, LABBARB    IMAGING I have personally reviewed the radiological images below and agree with the radiology interpretations.  Dg Chest 2 View 12/07/2015 No active cardiopulmonary disease.   Ct Head Wo Contrast 12/07/2015 1. No acute intracranial abnormality.  2. Advanced chronic microvascular disease.   Mr Jodene Elliott Head/brain Wo Cm 12/08/2015 1. Multiple small acute infarcts, located within the left pons, superior left thalamus and right occipital lobe.   No acute hemorrhage or mass effect.  2. No intracranial arterial  occlusion. Diminished flow related enhancement within the basilar artery may indicate a degree of stenosis. This could be further characterized with CTA if clinically indicated.  3. 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  4. Advanced chronic microvascular ischemia.   Ct Angio Head and neck W Contrast IMPRESSION: 1. 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 2. Right MCA bifurcation aneurysm projecting superiorly, measuring 5.4 mm from base to apex and 2 mm at its base. 3. Left proximal internal carotid artery stenosis measuring 40% by NASCET criteria. 4. Advanced chronic microvascular disease. Focal hypoattenuation at site of left pontine infarct. 5. Aortic atherosclerosis.   CUS - Bilateral: 1-39% ICA stenosis. Vertebral artery flow is antegrade.  TTE pending   PHYSICAL EXAM Constitutional: Appears well-developed and well-nourished.  Psych: Affect appropriate to situation Eyes: No scleral injection HENT: No OP obstrucion Head: Normocephalic.  Cardiovascular: Normal rate and regular rhythm.  Respiratory: Effort normal and breath sounds normal to anterior ascultation GI: Soft.  No distension. There is no tenderness.  Skin: WDI  Neuro: Mental Status: Patient is awake, alert, oriented to person, place, month, year, and situation. Patient is able to give a clear and coherent history. No signs of aphasia or neglect Cranial  Nerves: II: Visual Fields are full. Pupils are equal, round, and reactive to light.   III,IV, VI: EOMI without ptosis or diploplia.  V: Facial sensation is symmetric to temperature VII: Facial movement is symmetric.  VIII: hearing is intact to voice X: Uvula elevates symmetrically XI: Shoulder shrug is symmetric. XII: tongue is midline without atrophy or fasciculations.  Motor: Tone is normal. Bulk is normal. 5/5 strength was present  in all four extremities.  Sensory: Sensation is symmetric to light touch and temperature in the arms and legs. Cerebellar: FNF and HKS are intact bilaterally    ASSESSMENT/PLAN [name] is a 75 y.o. male with history of coronary artery disease with previous MI, previous stroke, hyperlipidemia, hypertension, COPD, hx of carotid artery disease, anxiety, depression, and history of lower GI bleed presenting with altered mental status. He did not receive IV t-PA due to improvement in deficits.  Stroke:  Left pons, left thalamus, right PCA punctate infarcts, possibly from BA stenosis. Although cardioembolic can not be completely ruled out.  Resultant  No deficits  MRI - 3 small posterior acute infarcts, located within the left pons, superior left thalamus and right occipital lobe.   MRA - Diminished flow the basilar artery.   CTA head and neck - BA stenosis, b/l ICA and L CCA advanced athero   Carotid Doppler - 1-39% ICA stenosis, Left > Right.  Vertebral artery flow is antegrade.   2D Echo - pending  Due to pt severe GIB history on antiplatelet in the past, will hold off further embolic work up at this time.   LDL - 115  HgbA1c - pending  VTE prophylaxis - Lovenox Diet Heart Room service appropriate? Yes; Fluid consistency: Thin  aspirin 81 mg daily and clopidogrel 75 mg daily prior to admission, now on aspirin 325 mg daily and clopidogrel 75 mg daily for both stroke and cardiac prevention.  Patient counseled to be compliant with his antithrombotic medications  Ongoing aggressive stroke risk factor management  Therapy recommendations: pending  Disposition:  Pending  Hypertension  Stable Permissive hypertension (OK if < 220/120) but gradually normalize in 5-7 days Long-term BP goal normotensive  Hyperlipidemia  Home meds:  Zetia 10 mg daily resumed in hospital  LDL 115, goal < 70  Allergy to lipitor and zocor  Continue zetia at discharge  Other Stroke  Risk Factors  Advanced age  Former smoker - quit 18 years ago  Hx stroke/TIA  Family hx stroke (mother)  CAD s/p CABG and stenting - on DAPT PTA  Other Active Problems  Right MCA bifurcation aneurysm, 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 Please call with questions. Pt will follow up with Cecille Rubin NP at [facility] in about 6 weeks. Thanks for the consult.  Rosalin Hawking, MD PhD Stroke Neurology 12/09/2015 1:26 PM

## 2015-12-09 NOTE — Progress Notes (Signed)
Pt refused to having morning CBG taken.Marland KitchenMarland KitchenCheron Schaumann RN

## 2015-12-09 NOTE — Progress Notes (Signed)
PROGRESS NOTE    William Elliott  [id number] DOB: October 03, 1940 DOA: 12/07/2015 PCP: [name], MD    Brief Narrative:  William Elliott is a 75 y.o. male with a past medical history significant for CAD s/p PCI in Jan, HTN, COPD, and hx of TIA who presents with transient confusion.  The patient was in his usual state of health until this morning around 0330, his wife went to let the dogs out and when she came back to the bedroom she found him standing by the night stand using his albuterol inhaler. When he got back in bed, she noticed he was confused, asking questions about where his sons lived, where his grandsons were, and she found that he was somewhat disoriented. She checked his blood pressure which she noted was "high" (meaning that the lower number was greater than 60), so she was worried about stroke and brought him to the ER.  He had no slurred speech, no unilateral weakness, no numbness, no difficulty speaking, no loss of consciousness or seizure, no fall. He does take alprazolam 0.5 mg 2-4 times per day for anxiety which he has done for many years (has tried both sertraline and bupropion for anxiety which have both not helped). In emergency department  Patient was afebrile, heart rate 50s and 40s, sinus rhythm, respirations and pulse oximetry normal, slightly hypertensive. Na 134, K 3.7, Cr 0.9, WBC 6.1K, Hgb 14.4 Troponin negative  CXR and CT head unremarkable, ECG showed sinus bradycardia without ST changes, MRI was not available at [facility], and so [facility] were asked to evaluate for observation and expedited TIA workup at [facility]. Patient transferred to [facility] and was found to have CVA.  Workup for CVA including Carotid ultrasound and Echo was ordered.   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 -Place in telemetry -Neuro checks, NIHSS per protocol - Plavix and aspirin 325 mg -Carotid doppler: Bilateral: mild to moderate calcific plaque CCA. Moderate calcific plaque origin and proximal ICA and ECA. Right: 1-39% ICA plaquing. Left: 1-39% ICA stenosis at the bulb. Bilateral vertebral artery flow is antegrade. -MRI/MRA showed CVA and small aneurysm -Echocardiogram pending -Consult to Neurology, appreciate recommendations   CAD:  -Continue Plavix and aspirin -Continue Zetia (patient intolerant of statin)  HTN:  - Imdur, HCTZ, Losartan, amlodipine continued  Anxiety:  -May continue alprazolam as needed  GERD:  -Continue PPI   DVT prophylaxis: Lovenox Code Status: Full code Family Communication: no family at bedside Disposition Plan: discharge pending Echo and results   Consultants:   Neurology  Procedures:   Echo pending  Antimicrobials:   none    Subjective: Patient seen and evaluated.  So far tolerating normal strength aspirin and Plavix.  Patient reports that he understands the risk of taking medication and understands the risks of not taking the medication (aspirin and Plavix).  Echocardiogram is pending- after results patient can discharge.  He voices no other episodes of confusion and no weakness.  Eating well.  Ambulating well to and from the bathroom.  No chest pain, chest pressure, shortness of breath, nausea, vomiting, diarrhea.  Objective: Vitals:   12/08/15 1740 12/08/15 2223  12/09/15 0537 12/09/15 1018  BP: (!) 142/70 120/61 (!) 153/75 118/67  Pulse: (!) 50 (!) 46 (!) 49 (!) 59  Resp: 18 18 18 18   Temp: 98.4 F (36.9 C) 97.9 F (36.6 C) 97.4 F (36.3 C) 98 F (36.7 C)  TempSrc: Oral Oral Oral Oral  SpO2: 98% 98% 99% 99%  Weight:      Height:        No intake or output data in the 24 hours ending 12/09/15 1408 Filed Weights   12/07/15 0459  Weight: 86.2 kg (190 lb)    Examination:  General exam: Appears calm and comfortable  Respiratory system: Clear to auscultation. Respiratory effort normal. Cardiovascular system: S1 & S2 heard, RRR. No JVD, murmurs, rubs, gallops or clicks. No pedal edema. Gastrointestinal system: Abdomen is nondistended, soft and nontender. No organomegaly or masses felt. Normal bowel sounds heard. Central nervous system: Alert and oriented. No focal neurological deficits. Extremities: Symmetric 5 x 5 power. No deformities, clubbing or cyanosis Skin: No rashes, lesions or ulcers Psychiatry: Judgement and insight appear normal. Mood & affect appropriate.  Answers all questions appropriately    Data Reviewed: I have personally reviewed following labs and imaging studies  CBC:  Recent Labs Lab 12/07/15 0456  WBC 6.1  NEUTROABS 4.1  HGB 14.4  HCT 41.6  MCV 92.2  PLT [id number]   Basic Metabolic Panel:  Recent Labs Lab 12/07/15 0456  NA 134*  K 3.7  CL 100*  CO2 27  GLUCOSE 107*  BUN 10  CREATININE 0.90  CALCIUM 9.3   GFR: Estimated Creatinine Clearance: 75.5 mL/min (by C-G formula based on SCr of 0.9 mg/dL). Liver Function Tests:  Recent Labs Lab 12/07/15 0456  AST 23  ALT 24  ALKPHOS 59  BILITOT 0.8  PROT 7.3  ALBUMIN 4.3   No results for input(s): LIPASE, AMYLASE in the last 168 hours. No results for input(s): AMMONIA in the last 168 hours. Coagulation Profile: No results for input(s): INR, PROTIME in the last 168 hours. Cardiac Enzymes:  Recent Labs Lab 12/07/15 0456  TROPONINI <0.03   BNP (last 3 results) No results for input(s): PROBNP in the last 8760 hours. HbA1C: No results for input(s): HGBA1C in the last 72 hours. CBG:  Recent Labs Lab 12/07/15 1654 12/07/15 2120 12/08/15 0615 12/08/15 1135 12/08/15 1715  GLUCAP 81 86 97 96 117*   Lipid  Profile:  Recent Labs  12/09/15 0616  CHOL 176  HDL 30*  LDLCALC 115*  TRIG 157*  CHOLHDL 5.9   Thyroid Function Tests: No results for input(s): TSH, T4TOTAL, FREET4, T3FREE, THYROIDAB in the last 72 hours. Anemia Panel: No results for input(s): VITAMINB12, FOLATE, FERRITIN, TIBC, IRON, RETICCTPCT in the last 72 hours. Sepsis Labs: No results for input(s): PROCALCITON, LATICACIDVEN in the last 168 hours.  No results found for this or any previous visit (from the past 240 hour(s)).       Radiology Studies: Ct Angio Head W Or Wo Contrast  Result Date: 12/08/2015 CLINICAL DATA:  Multiple small acute infarcts. EXAM: CT ANGIOGRAPHY HEAD AND NECK TECHNIQUE: Multidetector CT imaging of the head and neck was performed using the standard protocol during bolus administration of intravenous contrast. Multiplanar CT image reconstructions and MIPs were obtained to evaluate the vascular anatomy. Carotid stenosis measurements (when applicable) are obtained utilizing NASCET criteria, using the distal internal carotid diameter as the denominator. CONTRAST:  50 mL Isovue 370 COMPARISON:  Brain MRI 12/08/2015, head CT 12/07/2015 FINDINGS: CT HEAD  FINDINGS Brain: There is diffuse periventricular hypoattenuation compatible with advanced chronic microvascular disease. Small area of hypoattenuation in the left pons corresponds to the acute infarct seen on MRI. The other areas of acute ischemia are not visible by CT. No intracranial hemorrhage. Vascular: There is extensive calcification of the distal left vertebral artery/proximal basilar artery, in the region of the signal abnormality seen on the previous MRA. Skull: Negative Sinuses: Clear Orbits: Bilateral lens replacements. Review of the MIP images confirms the above findings CTA NECK FINDINGS Aortic arch: There is atherosclerotic calcification of the aortic arch and proximal arch vessels. There is a bovine variant branching pattern with the left common  carotid and brachiocephalic artery sharing a common origin. Proximal subclavian arteries are normal. Right carotid system: There is atherosclerotic calcification of the right common carotid artery, including the carotid bifurcation. No hemodynamically significant stenosis of the right internal carotid artery. No aneurysm or dissection. Left carotid system: There is atherosclerotic plaquing calcification at the left carotid bifurcation with stenosis measuring 40% by NASCET criteria. No aneurysm or dissection. Vertebral arteries: There is at least moderate narrowing of the left vertebral artery origin. The right vertebral artery origin is at least mildly narrowed. The vertebral system is left dominant. There is multifocal calcification within the left vertebral artery the results and approximately 55% narrowing at the C6 level. There is severe stenosis at the vertebral basilar confluence (series 502, image 209). The basilar artery remains patent however. This corresponds to the region of diminished flow related enhancement seen on the MRA. The right vertebral artery is diminutive distal to the origin of the right PICA. Skeleton: No bony spinal canal stenosis. Multilevel severe facet hypertrophy. Flowing anterior cervical osteophytes. Other neck: Unremarkable Upper chest: No pulmonary nodules or masses. Review of the MIP images confirms the above findings CTA HEAD FINDINGS Anterior circulation: --Intracranial internal carotid arteries: There is atherosclerotic calcification within the proximal intracranial internal carotid arteries bilaterally without hemodynamically significant stenosis. --Anterior cerebral arteries: Normal. --Middle cerebral arteries: There is again seen a superiorly projecting aneurysm arising from the right MCA bifurcation. The aneurysm measures 2 mm at its base and 5.4 mm from base to apex (series 503 image 86). The left middle cerebral artery is normal. --Posterior communicating arteries: Absent  bilaterally. Posterior circulation: --Posterior cerebral arteries: Normal. --Superior cerebellar arteries: Normal. 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 The distal basilar is patent. --Anterior inferior cerebellar arteries: Not visualized, which is not uncommon. --Posterior inferior cerebellar arteries: Normal. Venous sinuses: As permitted by contrast timing, patent. Anatomic variants: None Delayed phase: No parenchymal contrast enhancement. Review of the MIP images confirms the above findings IMPRESSION: 1. 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 2. Right MCA bifurcation aneurysm projecting superiorly, measuring 5.4 mm from base to apex and 2 mm at its base. 3. Left proximal internal carotid artery stenosis measuring 40% by NASCET criteria. 4. Advanced chronic microvascular disease. Focal hypoattenuation at site of left pontine infarct. 5. Aortic atherosclerosis. Electronically Signed   By: Ulyses Jarred M.D.   On: 12/08/2015 23:05   Ct Angio Neck W Or Wo Contrast  Result Date: 12/08/2015 CLINICAL DATA:  Multiple small acute infarcts. EXAM: CT ANGIOGRAPHY HEAD AND NECK TECHNIQUE: Multidetector CT imaging of the head and neck was performed using the standard protocol during bolus administration of intravenous contrast. Multiplanar CT image reconstructions and MIPs  were obtained to evaluate the vascular anatomy. Carotid stenosis measurements (when applicable) are obtained utilizing NASCET criteria, using the distal internal carotid diameter as the denominator. CONTRAST:  50 mL Isovue 370 COMPARISON:  Brain MRI 12/08/2015, head CT 12/07/2015 FINDINGS: CT HEAD FINDINGS Brain: There is diffuse periventricular hypoattenuation compatible with advanced chronic microvascular disease. Small area of hypoattenuation in the left pons corresponds to the acute infarct seen  on MRI. The other areas of acute ischemia are not visible by CT. No intracranial hemorrhage. Vascular: There is extensive calcification of the distal left vertebral artery/proximal basilar artery, in the region of the signal abnormality seen on the previous MRA. Skull: Negative Sinuses: Clear Orbits: Bilateral lens replacements. Review of the MIP images confirms the above findings CTA NECK FINDINGS Aortic arch: There is atherosclerotic calcification of the aortic arch and proximal arch vessels. There is a bovine variant branching pattern with the left common carotid and brachiocephalic artery sharing a common origin. Proximal subclavian arteries are normal. Right carotid system: There is atherosclerotic calcification of the right common carotid artery, including the carotid bifurcation. No hemodynamically significant stenosis of the right internal carotid artery. No aneurysm or dissection. Left carotid system: There is atherosclerotic plaquing calcification at the left carotid bifurcation with stenosis measuring 40% by NASCET criteria. No aneurysm or dissection. Vertebral arteries: There is at least moderate narrowing of the left vertebral artery origin. The right vertebral artery origin is at least mildly narrowed. The vertebral system is left dominant. There is multifocal calcification within the left vertebral artery the results and approximately 55% narrowing at the C6 level. There is severe stenosis at the vertebral basilar confluence (series 502, image 209). The basilar artery remains patent however. This corresponds to the region of diminished flow related enhancement seen on the MRA. The right vertebral artery is diminutive distal to the origin of the right PICA. Skeleton: No bony spinal canal stenosis. Multilevel severe facet hypertrophy. Flowing anterior cervical osteophytes. Other neck: Unremarkable Upper chest: No pulmonary nodules or masses. Review of the MIP images confirms the above findings CTA HEAD  FINDINGS Anterior circulation: --Intracranial internal carotid arteries: There is atherosclerotic calcification within the proximal intracranial internal carotid arteries bilaterally without hemodynamically significant stenosis. --Anterior cerebral arteries: Normal. --Middle cerebral arteries: There is again seen a superiorly projecting aneurysm arising from the right MCA bifurcation. The aneurysm measures 2 mm at its base and 5.4 mm from base to apex (series 503 image 86). The left middle cerebral artery is normal. --Posterior communicating arteries: Absent bilaterally. Posterior circulation: --Posterior cerebral arteries: Normal. --Superior cerebellar arteries: Normal. 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 The distal basilar is patent. --Anterior inferior cerebellar arteries: Not visualized, which is not uncommon. --Posterior inferior cerebellar arteries: Normal. Venous sinuses: As permitted by contrast timing, patent. Anatomic variants: None Delayed phase: No parenchymal contrast enhancement. Review of the MIP images confirms the above findings IMPRESSION: 1. 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 2. Right MCA bifurcation aneurysm projecting superiorly, measuring 5.4 mm from base to apex and 2 mm at its base. 3. Left proximal internal carotid artery stenosis measuring 40% by NASCET criteria. 4. Advanced chronic microvascular disease. Focal hypoattenuation at site of left pontine infarct. 5. Aortic atherosclerosis. Electronically Signed   By: Ulyses Jarred M.D.   On: 12/08/2015 23:05   Mr Brain Wo Contrast  Result Date: 12/08/2015  CLINICAL DATA:  Altered mental status.  Suspected TIA. EXAM: MRI HEAD WITHOUT CONTRAST MRA HEAD WITHOUT CONTRAST TECHNIQUE: Multiplanar, multiecho pulse sequences of the brain and surrounding structures were obtained without  intravenous contrast. Angiographic images of the head were obtained using MRA technique without contrast. COMPARISON:  Head CT 12/07/2015 FINDINGS: MRI HEAD FINDINGS Brain: There are multiple focal areas of diffusion restriction, located within the left pons, right occipital lobe and superior left thalamus. The midline structures are normal. There is confluent hyperintense T2-weighted signal within the periventricular, deep and subcortical white matter, most often seen in the setting of chronic microvascular ischemia. No mass lesion or midline shift. No hydrocephalus or extra-axial fluid collection. Vascular: Major intracranial arterial and venous sinus flow voids are preserved. There are a few scattered punctate foci of susceptibility consistent with chronic microhemorrhage, in a nonspecific distribution. Skull and upper cervical spine: The visualized skull base, calvarium, upper cervical spine and extracranial soft tissues are normal. Sinuses/Orbits: No fluid levels or advanced mucosal thickening. No mastoid effusion. Normal orbits. MRA HEAD FINDINGS Intracranial internal carotid arteries: Normal. Anterior cerebral arteries: Normal. Middle cerebral arteries: There is an aneurysm at the right MCA bifurcation, which measures 2 mm at its base and 4 mm from base to apex. The middle cerebral arteries are otherwise normal without high-grade stenosis or occlusion. Posterior communicating arteries: Absent bilaterally. Posterior cerebral arteries: Normal. Basilar artery: There is diminished flow related enhancement within the basilar artery proximally. Vertebral arteries: Left dominant. The right vertebral artery terminates in PICA. Superior cerebellar arteries: Normal. Anterior inferior cerebellar arteries: Not visualized, which is not uncommon. Posterior inferior cerebellar arteries: Normal. IMPRESSION: 1. Multiple small acute infarcts, located within the left pons, superior left thalamus and right occipital lobe. No  acute hemorrhage or mass effect. 2. No intracranial arterial occlusion. Diminished flow related enhancement within the basilar artery may indicate a degree of stenosis. This could be further characterized with CTA if clinically indicated. 3. 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 4. Advanced chronic microvascular ischemia. Electronically Signed   By: Ulyses Jarred M.D.   On: 12/08/2015 05:50   [name] Head/brain [id number] Cm  Result Date: 12/08/2015 CLINICAL DATA:  Altered mental status.  Suspected TIA. EXAM: MRI HEAD WITHOUT CONTRAST MRA HEAD WITHOUT CONTRAST TECHNIQUE: Multiplanar, multiecho pulse sequences of the brain and surrounding structures were obtained without intravenous contrast. Angiographic images of the head were obtained using MRA technique without contrast. COMPARISON:  Head CT 12/07/2015 FINDINGS: MRI HEAD FINDINGS Brain: There are multiple focal areas of diffusion restriction, located within the left pons, right occipital lobe and superior left thalamus. The midline structures are normal. There is confluent hyperintense T2-weighted signal within the periventricular, deep and subcortical white matter, most often seen in the setting of chronic microvascular ischemia. No mass lesion or midline shift. No hydrocephalus or extra-axial fluid collection. Vascular: Major intracranial arterial and venous sinus flow voids are preserved. There are a few scattered punctate foci of susceptibility consistent with chronic microhemorrhage, in a nonspecific distribution. Skull and upper cervical spine: The visualized skull base, calvarium, upper cervical spine and extracranial soft tissues are normal. Sinuses/Orbits: No fluid levels or advanced mucosal thickening. No mastoid effusion. Normal orbits. MRA HEAD FINDINGS Intracranial internal carotid arteries: Normal. Anterior cerebral arteries: Normal. Middle cerebral arteries: There is an aneurysm at the right MCA  bifurcation, which measures 2 mm at its base and 4 mm from base to apex. The middle  cerebral arteries are otherwise normal without high-grade stenosis or occlusion. Posterior communicating arteries: Absent bilaterally. Posterior cerebral arteries: Normal. Basilar artery: There is diminished flow related enhancement within the basilar artery proximally. Vertebral arteries: Left dominant. The right vertebral artery terminates in PICA. Superior cerebellar arteries: Normal. Anterior inferior cerebellar arteries: Not visualized, which is not uncommon. Posterior inferior cerebellar arteries: Normal. IMPRESSION: 1. Multiple small acute infarcts, located within the left pons, superior left thalamus and right occipital lobe. No acute hemorrhage or mass effect. 2. No intracranial arterial occlusion. Diminished flow related enhancement within the basilar artery may indicate a degree of stenosis. This could be further characterized with CTA if clinically indicated. 3. 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 4. Advanced chronic microvascular ischemia. Electronically Signed   By: Ulyses Jarred M.D.   On: 12/08/2015 05:50        Scheduled Meds: . amLODipine  5 mg Oral Daily  . aspirin EC  325 mg Oral Daily  . clopidogrel  75 mg Oral Q breakfast  . enoxaparin (LOVENOX) injection  40 mg Subcutaneous Q24H  . ezetimibe  10 mg Oral Daily  . hydrochlorothiazide  25 mg Oral Daily  . isosorbide mononitrate  30 mg Oral Daily  . losartan  50 mg Oral Daily  . pantoprazole  40 mg Oral Daily  . potassium chloride SA  20 mEq Oral BID   Continuous Infusions:    LOS: 1 day    Time spent: 35 minutes    Newman Pies, MD [facility] Pager [phone]  If 7PM-7AM, please contact night-coverage [email or web address] Password [email or web address] 12/09/2015, 2:08 PM

## 2015-12-09 NOTE — Consult Note (Signed)
[facility] CM Primary Care Navigator  12/09/2015  MARQUES ERICSON November 27, 1940 [id number]  Met with patient at the bedside to identify possible discharge needs.  According to patient, wife had noticed his disoriented episode for few minutes which led to being admitted. Patient endorses Dr. Allyn Kenner at [facility] in [place] as the primary care provider.    Patient shared using [facility] in [place] to obtain medications with no difficulty and manages his own medicines at home using "pill box system" as stated.  He is able to drive self prior to this admission. At times, wife transports him as needed, per patient. Wife Rip Harbour) can provide transportation to his  doctors' appointments until he is able. According to patient, he had been independent with self care before to this admission. Wife will be able to assist with his care at home when needed according to patient.   Patient expressed understanding to call primary care provider's office for a post discharge follow-up appointment within a week or sooner if needs arise. Patient letter given to remind him.  Patient had refused referral for disease management (COPD). He states "does not need it" and "has only very slight case" of it. Patient mentioned using Ventolin inhaler when he starts experiencing shortness of breath.  He denies further needs or concerns at this time.  Contact information provided in case he changes his mind. For additional questions please contact:  Edwena Felty A. Jazman Reuter, BSN, RN-BC [facility] PRIMARY CARE Navigator Cell: [phone]

## 2015-12-09 NOTE — Progress Notes (Deleted)
PROGRESS NOTE    FAUSTO MERRICK  [id number] DOB: 10/08/1940 DOA: 12/07/2015 PCP: [name], MD    Brief Narrative:  WHELAN SAHM is a 75 y.o. male with a past medical history significant for CAD s/p PCI in Jan, HTN, COPD, and hx of TIA who presents with transient confusion.  The patient was in his usual state of health until this morning around 0330, his wife went to let the dogs out and when she came back to the bedroom she found him standing by the night stand using his albuterol inhaler. When he got back in bed, she noticed he was confused, asking questions about where his sons lived, where his grandsons were, and she found that he was somewhat disoriented. She checked his blood pressure which she noted was "high" (meaning that the lower number was greater than 60), so she was worried about stroke and brought him to the ER.He had no slurred speech, no unilateral weakness, no numbness, no difficulty speaking, no loss of consciousness or seizure, no fall. He does take alprazolam 0.5 mg 2-4 times per day for anxiety which he has done for many years (has tried both sertraline and bupropion for anxiety which have both not helped).  At admission patient was afebrile, heart rate 50s and 40s, sinus rhythm, respirations and pulse oximetry normal, slightly hypertensive Na 134, K 3.7, Cr 0.9, WBC 6.1K, Hgb 14.4. Troponin negative. CXR and CT head unremarkable. ECG showed sinus bradycardia without ST changes. MRI was not available at [facility], and so [facility] were asked to evaluate for observation and expedited TIA workup at [facility]. Patient was transferred to [facility] and underwent MRI which showed embolic strokes.     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 - Place in telemetry - Neuro checks, NIHSS per protocol - Plavix and aspirin 325 mg - Carotid doppler: Bilateral: mild to moderate calcific plaque CCA. Moderate calcific plaque origin and proximal ICA and ECA. Right: 1-39% ICA plaquing. Left: 1-39% ICA stenosis at the bulb. Bilateral vertebral artery flow is antegrade. 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 of diminished flow related enhancement described on the MRA. Right MCA bifurcation aneurysm projecting superiorly, measuring 5.4 mm from base to apex and 2 mm at its base. Left proximal internal carotid artery stenosis measuring 40% by NASCET criteria. Advanced chronic microvascular disease. Focal hypoattenuation at site of left pontine infarct. Aortic atherosclerosis - Echocardiogram pending - Consult to Neurology, appreciate recommendations   CAD:  -Continue Plavix and aspirin (full strength) -Continue Zetia  HTN:  - Imdur, HCTZ, Losartan, amlodipine continued  Anxiety:  -May continue alprazolam as needed  GERD:  -Continue PPI   DVT prophylaxis: Lovenox Code Status: Full code Family Communication: no family at bedside Disposition Plan: pending echocardiogram, PT/OT evaluation   Consultants:   Neurology  Procedures:   Echo pending  Antimicrobials:   none    Subjective: Patient seen and evaluated.  He had voiced concern about use of Plavix and aspirin today as he has a Mallory Weiss tear in his stomach and has  a history of GI bleed on Plavix.  He is on both medications at this time and denies melena and hematochezia. Patient has been able to ambulate to and from the bathroom without difficulty.  He voices no confusion since arrival. Echocardiogram scheduled for today.  Objective: Vitals:   12/08/15 0927  12/08/15 1740 12/08/15 2223 12/09/15 0537  BP: 130/72 (!) 142/70 120/61 (!) 153/75  Pulse: (!) 51 (!) 50 (!) 46 (!) 49  Resp: 18 18 18 18   Temp: 98.3 F (36.8 C) 98.4 F (36.9 C) 97.9 F (36.6 C) 97.4 F (36.3 C)  TempSrc: Oral Oral Oral Oral  SpO2: 98% 98% 98% 99%  Weight:      Height:       No intake or output data in the 24 hours ending 12/09/15 0848 Filed Weights   12/07/15 0459  Weight: 86.2 kg (190 lb)    Examination:  General exam: Appears calm and comfortable  Respiratory system: Clear to auscultation. Respiratory effort normal. Cardiovascular system: S1 & S2 heard, RRR. No JVD, murmurs, rubs, gallops or clicks. No pedal edema. Gastrointestinal system: Abdomen is nondistended, soft and nontender. No organomegaly or masses felt. Normal bowel sounds heard. Central nervous system: Alert and oriented. No focal neurological deficits. Extremities: Symmetric 5 x 5 power. NO deformities, clubbing or cyanosis Skin: No rashes, lesions or ulcers Psychiatry: Judgement and insight appear normal. Mood & affect appropriate.     Data Reviewed: I have personally reviewed following labs and imaging studies  CBC:  Recent Labs Lab 12/07/15 0456  WBC 6.1  NEUTROABS 4.1  HGB 14.4  HCT 41.6  MCV 92.2  PLT [id number]   Basic Metabolic Panel:  Recent Labs Lab 12/07/15 0456  NA 134*  K 3.7  CL 100*  CO2 27  GLUCOSE 107*  BUN 10  CREATININE 0.90  CALCIUM 9.3   GFR: Estimated Creatinine Clearance: 75.5 mL/min (by C-G formula based on SCr of 0.9 mg/dL). Liver Function Tests:  Recent Labs Lab 12/07/15 0456  AST 23  ALT 24  ALKPHOS 59  BILITOT 0.8  PROT 7.3  ALBUMIN 4.3   No results for input(s): LIPASE, AMYLASE in the last 168 hours. No results for input(s): AMMONIA in the last 168 hours. Coagulation Profile: No results for input(s): INR, PROTIME in the last 168 hours. Cardiac Enzymes:  Recent Labs Lab 12/07/15 0456  TROPONINI <0.03   BNP (last 3 results) No  results for input(s): PROBNP in the last 8760 hours. HbA1C: No results for input(s): HGBA1C in the last 72 hours. CBG:  Recent Labs Lab 12/07/15 1654 12/07/15 2120 12/08/15 0615 12/08/15 1135 12/08/15 1715  GLUCAP 81 86 97 96 117*   Lipid Profile:  Recent Labs  12/09/15 0616  CHOL 176  HDL 30*  LDLCALC 115*  TRIG 157*  CHOLHDL 5.9   Thyroid Function Tests: No results for input(s): TSH, T4TOTAL, FREET4, T3FREE, THYROIDAB in the last 72 hours. Anemia Panel: No results for input(s): VITAMINB12, FOLATE, FERRITIN, TIBC, IRON, RETICCTPCT in the last 72 hours. Sepsis Labs: No results for input(s): PROCALCITON, LATICACIDVEN in the last 168 hours.  No results found for this or any previous visit (from the past 240 hour(s)).       Radiology Studies: Ct Angio Head W Or Wo Contrast  Result Date: 12/08/2015 CLINICAL DATA:  Multiple small acute infarcts. EXAM: CT ANGIOGRAPHY HEAD AND NECK TECHNIQUE: Multidetector CT imaging of the head and neck was performed using the standard protocol during bolus administration of intravenous  contrast. Multiplanar CT image reconstructions and MIPs were obtained to evaluate the vascular anatomy. Carotid stenosis measurements (when applicable) are obtained utilizing NASCET criteria, using the distal internal carotid diameter as the denominator. CONTRAST:  50 mL Isovue 370 COMPARISON:  Brain MRI 12/08/2015, head CT 12/07/2015 FINDINGS: CT HEAD FINDINGS Brain: There is diffuse periventricular hypoattenuation compatible with advanced chronic microvascular disease. Small area of hypoattenuation in the left pons corresponds to the acute infarct seen on MRI. The other areas of acute ischemia are not visible by CT. No intracranial hemorrhage. Vascular: There is extensive calcification of the distal left vertebral artery/proximal basilar artery, in the region of the signal abnormality seen on the previous MRA. Skull: Negative Sinuses: Clear Orbits: Bilateral lens  replacements. Review of the MIP images confirms the above findings CTA NECK FINDINGS Aortic arch: There is atherosclerotic calcification of the aortic arch and proximal arch vessels. There is a bovine variant branching pattern with the left common carotid and brachiocephalic artery sharing a common origin. Proximal subclavian arteries are normal. Right carotid system: There is atherosclerotic calcification of the right common carotid artery, including the carotid bifurcation. No hemodynamically significant stenosis of the right internal carotid artery. No aneurysm or dissection. Left carotid system: There is atherosclerotic plaquing calcification at the left carotid bifurcation with stenosis measuring 40% by NASCET criteria. No aneurysm or dissection. Vertebral arteries: There is at least moderate narrowing of the left vertebral artery origin. The right vertebral artery origin is at least mildly narrowed. The vertebral system is left dominant. There is multifocal calcification within the left vertebral artery the results and approximately 55% narrowing at the C6 level. There is severe stenosis at the vertebral basilar confluence (series 502, image 209). The basilar artery remains patent however. This corresponds to the region of diminished flow related enhancement seen on the MRA. The right vertebral artery is diminutive distal to the origin of the right PICA. Skeleton: No bony spinal canal stenosis. Multilevel severe facet hypertrophy. Flowing anterior cervical osteophytes. Other neck: Unremarkable Upper chest: No pulmonary nodules or masses. Review of the MIP images confirms the above findings CTA HEAD FINDINGS Anterior circulation: --Intracranial internal carotid arteries: There is atherosclerotic calcification within the proximal intracranial internal carotid arteries bilaterally without hemodynamically significant stenosis. --Anterior cerebral arteries: Normal. --Middle cerebral arteries: There is again seen a  superiorly projecting aneurysm arising from the right MCA bifurcation. The aneurysm measures 2 mm at its base and 5.4 mm from base to apex (series 503 image 86). The left middle cerebral artery is normal. --Posterior communicating arteries: Absent bilaterally. Posterior circulation: --Posterior cerebral arteries: Normal. --Superior cerebellar arteries: Normal. 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 The distal basilar is patent. --Anterior inferior cerebellar arteries: Not visualized, which is not uncommon. --Posterior inferior cerebellar arteries: Normal. Venous sinuses: As permitted by contrast timing, patent. Anatomic variants: None Delayed phase: No parenchymal contrast enhancement. Review of the MIP images confirms the above findings IMPRESSION: 1. 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 2. Right MCA bifurcation aneurysm projecting superiorly, measuring 5.4 mm from base to apex and 2 mm at its base. 3. Left proximal internal carotid artery stenosis measuring 40% by NASCET criteria. 4. Advanced chronic microvascular disease. Focal hypoattenuation at site of left pontine infarct. 5. Aortic atherosclerosis. Electronically Signed   By: Ulyses Jarred M.D.   On: 12/08/2015 23:05   Ct  Angio Neck W Or Wo Contrast  Result Date: 12/08/2015 CLINICAL DATA:  Multiple small acute infarcts. EXAM: CT ANGIOGRAPHY HEAD AND NECK TECHNIQUE: Multidetector CT imaging of the head and neck was performed using the standard protocol during bolus administration of intravenous contrast. Multiplanar CT image reconstructions and MIPs were obtained to evaluate the vascular anatomy. Carotid stenosis measurements (when applicable) are obtained utilizing NASCET criteria, using the distal internal carotid diameter as the denominator. CONTRAST:  50 mL Isovue 370 COMPARISON:  Brain MRI  12/08/2015, head CT 12/07/2015 FINDINGS: CT HEAD FINDINGS Brain: There is diffuse periventricular hypoattenuation compatible with advanced chronic microvascular disease. Small area of hypoattenuation in the left pons corresponds to the acute infarct seen on MRI. The other areas of acute ischemia are not visible by CT. No intracranial hemorrhage. Vascular: There is extensive calcification of the distal left vertebral artery/proximal basilar artery, in the region of the signal abnormality seen on the previous MRA. Skull: Negative Sinuses: Clear Orbits: Bilateral lens replacements. Review of the MIP images confirms the above findings CTA NECK FINDINGS Aortic arch: There is atherosclerotic calcification of the aortic arch and proximal arch vessels. There is a bovine variant branching pattern with the left common carotid and brachiocephalic artery sharing a common origin. Proximal subclavian arteries are normal. Right carotid system: There is atherosclerotic calcification of the right common carotid artery, including the carotid bifurcation. No hemodynamically significant stenosis of the right internal carotid artery. No aneurysm or dissection. Left carotid system: There is atherosclerotic plaquing calcification at the left carotid bifurcation with stenosis measuring 40% by NASCET criteria. No aneurysm or dissection. Vertebral arteries: There is at least moderate narrowing of the left vertebral artery origin. The right vertebral artery origin is at least mildly narrowed. The vertebral system is left dominant. There is multifocal calcification within the left vertebral artery the results and approximately 55% narrowing at the C6 level. There is severe stenosis at the vertebral basilar confluence (series 502, image 209). The basilar artery remains patent however. This corresponds to the region of diminished flow related enhancement seen on the MRA. The right vertebral artery is diminutive distal to the origin of the right  PICA. Skeleton: No bony spinal canal stenosis. Multilevel severe facet hypertrophy. Flowing anterior cervical osteophytes. Other neck: Unremarkable Upper chest: No pulmonary nodules or masses. Review of the MIP images confirms the above findings CTA HEAD FINDINGS Anterior circulation: --Intracranial internal carotid arteries: There is atherosclerotic calcification within the proximal intracranial internal carotid arteries bilaterally without hemodynamically significant stenosis. --Anterior cerebral arteries: Normal. --Middle cerebral arteries: There is again seen a superiorly projecting aneurysm arising from the right MCA bifurcation. The aneurysm measures 2 mm at its base and 5.4 mm from base to apex (series 503 image 86). The left middle cerebral artery is normal. --Posterior communicating arteries: Absent bilaterally. Posterior circulation: --Posterior cerebral arteries: Normal. --Superior cerebellar arteries: Normal. 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 The distal basilar is patent. --Anterior inferior cerebellar arteries: Not visualized, which is not uncommon. --Posterior inferior cerebellar arteries: Normal. Venous sinuses: As permitted by contrast timing, patent. Anatomic variants: None Delayed phase: No parenchymal contrast enhancement. Review of the MIP images confirms the above findings IMPRESSION: 1. 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 2. Right MCA bifurcation aneurysm projecting superiorly, measuring 5.4 mm from base to apex and 2 mm at its base. 3. Left  proximal internal carotid artery stenosis measuring 40% by NASCET criteria. 4. Advanced chronic microvascular disease. Focal hypoattenuation at site of left pontine infarct. 5. Aortic atherosclerosis. Electronically Signed   By: Ulyses Jarred M.D.   On: 12/08/2015 23:05   Mr Brain Wo  Contrast  Result Date: 12/08/2015 CLINICAL DATA:  Altered mental status.  Suspected TIA. EXAM: MRI HEAD WITHOUT CONTRAST MRA HEAD WITHOUT CONTRAST TECHNIQUE: Multiplanar, multiecho pulse sequences of the brain and surrounding structures were obtained without intravenous contrast. Angiographic images of the head were obtained using MRA technique without contrast. COMPARISON:  Head CT 12/07/2015 FINDINGS: MRI HEAD FINDINGS Brain: There are multiple focal areas of diffusion restriction, located within the left pons, right occipital lobe and superior left thalamus. The midline structures are normal. There is confluent hyperintense T2-weighted signal within the periventricular, deep and subcortical white matter, most often seen in the setting of chronic microvascular ischemia. No mass lesion or midline shift. No hydrocephalus or extra-axial fluid collection. Vascular: Major intracranial arterial and venous sinus flow voids are preserved. There are a few scattered punctate foci of susceptibility consistent with chronic microhemorrhage, in a nonspecific distribution. Skull and upper cervical spine: The visualized skull base, calvarium, upper cervical spine and extracranial soft tissues are normal. Sinuses/Orbits: No fluid levels or advanced mucosal thickening. No mastoid effusion. Normal orbits. MRA HEAD FINDINGS Intracranial internal carotid arteries: Normal. Anterior cerebral arteries: Normal. Middle cerebral arteries: There is an aneurysm at the right MCA bifurcation, which measures 2 mm at its base and 4 mm from base to apex. The middle cerebral arteries are otherwise normal without high-grade stenosis or occlusion. Posterior communicating arteries: Absent bilaterally. Posterior cerebral arteries: Normal. Basilar artery: There is diminished flow related enhancement within the basilar artery proximally. Vertebral arteries: Left dominant. The right vertebral artery terminates in PICA. Superior cerebellar arteries:  Normal. Anterior inferior cerebellar arteries: Not visualized, which is not uncommon. Posterior inferior cerebellar arteries: Normal. IMPRESSION: 1. Multiple small acute infarcts, located within the left pons, superior left thalamus and right occipital lobe. No acute hemorrhage or mass effect. 2. No intracranial arterial occlusion. Diminished flow related enhancement within the basilar artery may indicate a degree of stenosis. This could be further characterized with CTA if clinically indicated. 3. 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 4. Advanced chronic microvascular ischemia. Electronically Signed   By: Ulyses Jarred M.D.   On: 12/08/2015 05:50   [name] Head/brain [id number] Cm  Result Date: 12/08/2015 CLINICAL DATA:  Altered mental status.  Suspected TIA. EXAM: MRI HEAD WITHOUT CONTRAST MRA HEAD WITHOUT CONTRAST TECHNIQUE: Multiplanar, multiecho pulse sequences of the brain and surrounding structures were obtained without intravenous contrast. Angiographic images of the head were obtained using MRA technique without contrast. COMPARISON:  Head CT 12/07/2015 FINDINGS: MRI HEAD FINDINGS Brain: There are multiple focal areas of diffusion restriction, located within the left pons, right occipital lobe and superior left thalamus. The midline structures are normal. There is confluent hyperintense T2-weighted signal within the periventricular, deep and subcortical white matter, most often seen in the setting of chronic microvascular ischemia. No mass lesion or midline shift. No hydrocephalus or extra-axial fluid collection. Vascular: Major intracranial arterial and venous sinus flow voids are preserved. There are a few scattered punctate foci of susceptibility consistent with chronic microhemorrhage, in a nonspecific distribution. Skull and upper cervical spine: The visualized skull base, calvarium, upper cervical spine and extracranial soft tissues are normal.  Sinuses/Orbits: No fluid  levels or advanced mucosal thickening. No mastoid effusion. Normal orbits. MRA HEAD FINDINGS Intracranial internal carotid arteries: Normal. Anterior cerebral arteries: Normal. Middle cerebral arteries: There is an aneurysm at the right MCA bifurcation, which measures 2 mm at its base and 4 mm from base to apex. The middle cerebral arteries are otherwise normal without high-grade stenosis or occlusion. Posterior communicating arteries: Absent bilaterally. Posterior cerebral arteries: Normal. Basilar artery: There is diminished flow related enhancement within the basilar artery proximally. Vertebral arteries: Left dominant. The right vertebral artery terminates in PICA. Superior cerebellar arteries: Normal. Anterior inferior cerebellar arteries: Not visualized, which is not uncommon. Posterior inferior cerebellar arteries: Normal. IMPRESSION: 1. Multiple small acute infarcts, located within the left pons, superior left thalamus and right occipital lobe. No acute hemorrhage or mass effect. 2. No intracranial arterial occlusion. Diminished flow related enhancement within the basilar artery may indicate a degree of stenosis. This could be further characterized with CTA if clinically indicated. 3. 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 4. Advanced chronic microvascular ischemia. Electronically Signed   By: Ulyses Jarred M.D.   On: 12/08/2015 05:50        Scheduled Meds: . amLODipine  5 mg Oral Daily  . aspirin EC  325 mg Oral Daily  . clopidogrel  75 mg Oral Q breakfast  . enoxaparin (LOVENOX) injection  40 mg Subcutaneous Q24H  . ezetimibe  10 mg Oral Daily  . hydrochlorothiazide  25 mg Oral Daily  . isosorbide mononitrate  30 mg Oral Daily  . losartan  50 mg Oral Daily  . pantoprazole  40 mg Oral Daily  . potassium chloride SA  20 mEq Oral BID   Continuous Infusions:    LOS: 1 day    Time spent: 35 minutes    Newman Pies, MD [facility] Pager [phone]  If 7PM-7AM, please contact night-coverage [email or web address] Password [email or web address] 12/09/2015, 8:48 AM

## 2015-12-09 NOTE — Progress Notes (Signed)
*  PRELIMINARY RESULTS* Echocardiogram 2D Echocardiogram has been performed.  William Elliott 12/09/2015, 4:21 PM

## 2015-12-10 DIAGNOSIS — I480 Paroxysmal atrial fibrillation: Secondary | ICD-10-CM | POA: Clinically undetermined

## 2015-12-10 LAB — HEMOGLOBIN A1C
Hgb A1c MFr Bld: 5.5 % (ref 4.8–5.6)
Mean Plasma Glucose: 111 mg/dL

## 2015-12-10 MED ORDER — ENOXAPARIN SODIUM 40 MG/0.4ML ~~LOC~~ SOLN: 40.0000 mg | SUBCUTANEOUS | Status: DC

## 2015-12-10 MED ORDER — ASPIRIN 325 MG PO TBEC: 325.0000 mg | DELAYED_RELEASE_TABLET | Freq: Every day | ORAL | 0 refills | Status: DC

## 2015-12-10 NOTE — Progress Notes (Signed)
OT Cancellation Note  Patient Details Name: William Elliott MRN: [id number] DOB: 06-05-1940   Cancelled Treatment:    Reason Eval/Treat Not Completed: OT screened, no needs identified, will sign off (Pt is at baseline.)  [name] 12/10/2015, 2:25 PM  [phone]

## 2015-12-10 NOTE — Consult Note (Addendum)
CONSULTATION NOTE  Reason for Consult: New onset a-fib  Requesting Physician: Dr. Jearld Shines  Cardiologist: Dr. Domenic Polite  HPI: This is a 75 y.o. male with a past medical history significant for CAD status post CABG in 1999, carotid stenosis, hypertension, dyslipidemia and COPD/asthma. He was recently admitted in January 2017 which was a day prior to his scheduled cardiac catheterization for worsening chest pain and ruled in for non-STEMI. Left heart cath demonstrated an occluded mid LAD proximal circumflex and mid RCA. 2 out of 3 vein grafts were occluded. He ultimately underwent PCI with drug-eluting stent placement to the vein graft to OM. Aspirin Plavix was recommended for minimum of 6 months. He is now admitted with confusion. He's been evaluated by neurology and found to have multiple small acute infarcts suggestive of cardioembolic event. Echo performed today shows atrial fibrillation with lack of A waves. Review of telemetry supports this as well. He has history of 2 strokes in the past therefore his CHADSVASC score is 4.  Of note he has a history of remote GI bleed secondary to a Mallory-Weiss tear. This was more than 8 years ago and has not had any recurrent bleeding, especially on aspirin and Plavix. I think the likelihood of recurrent bleeding is very low and is outweighed by the need to protect him from recurrent stroke secondary to atrial fibrillation.  PMHx:  Past Medical History:  Diagnosis Date  . Acute lower GI bleeding 2008  . Anxiety   . Asthma   . Carotid artery disease ([place])    Asymptomatic left carotid bruit  . COPD (chronic obstructive pulmonary disease) ([place])   . Coronary artery disease    a. CABG - 1999 with LIMA-LAD, SVG-DIAG, SVG-OM, SVG-RPDA  b. Cath in setting of NSTEMI 03/18/2015: patent LIMA-LAD, occluded SVG-RCA, occluded SVG-D1, 99% stenosis of SVG-OM treated w/ DES  . Depression   . Dyslipidemia   . Erectile dysfunction   . Essential hypertension   .  History of blood transfusion 2008  . History of stroke   . Hyperlipidemia   . Myocardial infarction    Past Surgical History:  Procedure Laterality Date  . CARDIAC CATHETERIZATION    . CARDIAC CATHETERIZATION N/A 03/18/2015   Procedure: Left Heart Cath and Cors/Grafts Angiography;  Surgeon: Leonie Man, MD;  Location: [facility];  Service: Cardiovascular;  Laterality: N/A;  . CARDIAC CATHETERIZATION N/A 03/18/2015   Procedure: Coronary Stent Intervention;  Surgeon: Leonie Man, MD;  Location: [facility];  Service: Cardiovascular;  Laterality: N/A;  . CATARACT EXTRACTION W/ INTRAOCULAR LENS  IMPLANT, BILATERAL Bilateral   . CORONARY ANGIOPLASTY    . CORONARY ARTERY BYPASS GRAFT  1999  . FRACTURE SURGERY    . HERNIA REPAIR    . [facility]; took bone out of my right hip  . TONSILLECTOMY    . UMBILICAL HERNIA REPAIR  ~ 2000    FAMHx: Family History  Problem Relation Age of Onset  . Heart disease Father   . Heart attack Father   . Hypertension Mother   . Stroke Mother     SOCHx:  reports that he quit smoking about 18 years ago. His smoking use included Cigars. He quit after 10.00 years of use. He has never used smokeless tobacco. He reports that he does not drink alcohol or use drugs.  ALLERGIES: Allergies  Allergen Reactions  . Anoro Ellipta [Umeclidinium-Vilanterol] Other (See Comments)  Cardiac problems  . Stiolto Respimat [Tiotropium Bromide-Olodaterol] Other (See Comments)    Cardiac problems  . Fenofibrate Other (See Comments)    No energy  . Lipitor [Atorvastatin Calcium] Other (See Comments)    Not known   . Niaspan [Niacin] Other (See Comments)    Not known  . Zocor [Simvastatin] Other (See Comments)    Not known    ROS: Pertinent items noted in HPI and remainder of comprehensive ROS otherwise negative.  HOME MEDICATIONS: No current facility-administered medications on file prior to  encounter.    Current Outpatient Prescriptions on File Prior to Encounter  Medication Sig Dispense Refill  . Albuterol Sulfate 108 (90 BASE) MCG/ACT AEPB Inhale 2 puffs into the lungs 4 (four) times daily as needed (wheezing and sob).    . ALPRAZolam (XANAX) 1 MG tablet TAKE 1 TABLET BY MOUTH FOUR TIMES DAILY 120 tablet 2  . amLODipine (NORVASC) 5 MG tablet Take 1 tablet (5 mg total) by mouth daily. 30 tablet 6  . aspirin 81 MG tablet Take 81 mg by mouth daily.    Marland Kitchen b complex vitamins tablet Take 1 tablet by mouth daily.    . clopidogrel (PLAVIX) 75 MG tablet Take 1 tablet (75 mg total) by mouth daily with breakfast. 30 tablet 6  . ezetimibe (ZETIA) 10 MG tablet Take 1 tablet (10 mg total) by mouth daily. 90 tablet 3  . fluticasone (FLONASE) 50 MCG/ACT nasal spray USE 2 SPRAYS INTO EACH NOSTRIL DAILY. 16 g 2  . hydrochlorothiazide (HYDRODIURIL) 25 MG tablet Take 1 tablet (25 mg total) by mouth daily. 90 tablet 3  . isosorbide mononitrate (IMDUR) 30 MG 24 hr tablet Take 1 tablet (30 mg total) by mouth daily. 30 tablet 11  . losartan (COZAAR) 100 MG tablet Take 0.5 tablets (50 mg total) by mouth daily. 45 tablet 2  . multivitamin (THERAGRAN) per tablet Take 1 tablet by mouth daily.      . nitroGLYCERIN (NITROSTAT) 0.4 MG SL tablet Place 1 tablet (0.4 mg total) under the tongue every 5 (five) minutes as needed for chest pain. 25 tablet 3  . omega-3 acid ethyl esters (LOVAZA) 1 g capsule TAKE 2 CAPSULES BY MOUTH TWICE DAILY AS NEEDED 120 capsule 3  . pantoprazole (PROTONIX) 40 MG tablet Take 1 tablet (40 mg total) by mouth daily. 30 tablet 6  . potassium chloride SA (K-DUR,KLOR-CON) 20 MEQ tablet Take 1 tablet (20 mEq total) by mouth 2 (two) times daily. 180 tablet 3    HOSPITAL MEDICATIONS: Prior to Admission:  Prescriptions Prior to Admission  Medication Sig Dispense Refill Last Dose  . Albuterol Sulfate 108 (90 BASE) MCG/ACT AEPB Inhale 2 puffs into the lungs 4 (four) times daily as needed  (wheezing and sob).   12/07/2015 at Unknown time  . ALPRAZolam (XANAX) 1 MG tablet TAKE 1 TABLET BY MOUTH FOUR TIMES DAILY 120 tablet 2 12/07/2015 at Unknown time  . amLODipine (NORVASC) 5 MG tablet Take 1 tablet (5 mg total) by mouth daily. 30 tablet 6 12/06/2015 at Unknown time  . aspirin 81 MG tablet Take 81 mg by mouth daily.   12/06/2015 at Unknown time  . b complex vitamins tablet Take 1 tablet by mouth daily.   12/06/2015 at Unknown time  . bisoprolol (ZEBETA) 5 MG tablet Take 5 mg by mouth daily.    12/06/2015 at 1030  . clopidogrel (PLAVIX) 75 MG tablet Take 1 tablet (75 mg total) by mouth daily with breakfast. 30 tablet  6 12/06/2015 at 1030  . ezetimibe (ZETIA) 10 MG tablet Take 1 tablet (10 mg total) by mouth daily. 90 tablet 3 12/06/2015 at Unknown time  . fluticasone (FLONASE) 50 MCG/ACT nasal spray USE 2 SPRAYS INTO EACH NOSTRIL DAILY. 16 g 2 12/06/2015 at Unknown time  . hydrochlorothiazide (HYDRODIURIL) 25 MG tablet Take 1 tablet (25 mg total) by mouth daily. 90 tablet 3 12/06/2015 at Unknown time  . isosorbide mononitrate (IMDUR) 30 MG 24 hr tablet Take 1 tablet (30 mg total) by mouth daily. 30 tablet 11 12/06/2015 at Unknown time  . losartan (COZAAR) 100 MG tablet Take 0.5 tablets (50 mg total) by mouth daily. 45 tablet 2 12/06/2015 at Unknown time  . multivitamin (THERAGRAN) per tablet Take 1 tablet by mouth daily.     12/06/2015 at Unknown time  . nitroGLYCERIN (NITROSTAT) 0.4 MG SL tablet Place 1 tablet (0.4 mg total) under the tongue every 5 (five) minutes as needed for chest pain. 25 tablet 3 unknown  . omega-3 acid ethyl esters (LOVAZA) 1 g capsule TAKE 2 CAPSULES BY MOUTH TWICE DAILY AS NEEDED 120 capsule 3 12/06/2015 at Unknown time  . pantoprazole (PROTONIX) 40 MG tablet Take 1 tablet (40 mg total) by mouth daily. 30 tablet 6 12/06/2015 at Unknown time  . potassium chloride SA (K-DUR,KLOR-CON) 20 MEQ tablet Take 1 tablet (20 mEq total) by mouth 2 (two) times daily. 180 tablet 3 12/06/2015  at Unknown time    VITALS: Blood pressure 126/77, pulse 67, temperature 97.6 F (36.4 C), temperature source Oral, resp. rate 20, height 5\' 10"  (1.778 m), weight 190 lb (86.2 kg), SpO2 99 %.  PHYSICAL EXAM: General appearance: alert and no distress Neck: no carotid bruit and no JVD Lungs: clear to auscultation bilaterally Heart: irregularly irregular rhythm Abdomen: soft, non-tender; bowel sounds normal; no masses,  no organomegaly Extremities: extremities normal, atraumatic, no cyanosis or edema Pulses: 2+ and symmetric Skin: Skin color, texture, turgor normal. No rashes or lesions Neurologic: Grossly normal Psych: Pleasant  LABS: Results for orders placed or performed during the hospital encounter of 12/07/15 (from the past 48 hour(s))  Lipid panel     Status: Abnormal   Collection Time: 12/09/15  6:16 AM  Result Value Ref Range   Cholesterol 176 0 - 200 mg/dL   Triglycerides 157 (H) <150 mg/dL   HDL 30 (L) >40 mg/dL   Total CHOL/HDL Ratio 5.9 RATIO   VLDL 31 0 - 40 mg/dL   LDL Cholesterol 115 (H) 0 - 99 mg/dL    Comment:        Total Cholesterol/HDL:CHD Risk Coronary Heart Disease Risk Table                     Men   Women  1/2 Average Risk   3.4   3.3  Average Risk       5.0   4.4  2 X Average Risk   9.6   7.1  3 X Average Risk  23.4   11.0        Use the calculated Patient Ratio above and the CHD Risk Table to determine the patient's CHD Risk.        ATP III CLASSIFICATION (LDL):  <100     mg/dL   Optimal  100-129  mg/dL   Near or Above                    Optimal  130-159  mg/dL  Borderline  160-189  mg/dL   High  >190     mg/dL   Very High   Hemoglobin A1c     Status: None   Collection Time: 12/09/15  6:16 AM  Result Value Ref Range   Hgb A1c MFr Bld 5.5 4.8 - 5.6 %    Comment: (NOTE)         Pre-diabetes: 5.7 - 6.4         Diabetes: >6.4         Glycemic control for adults with diabetes: <7.0    Mean Plasma Glucose 111 mg/dL    Comment:  (NOTE) Performed At: [facility] [place], [place] [id number] Lindon Romp MD [id number]     IMAGING: Ct Angio Head W Or Wo Contrast  Result Date: 12/08/2015 CLINICAL DATA:  Multiple small acute infarcts. EXAM: CT ANGIOGRAPHY HEAD AND NECK TECHNIQUE: Multidetector CT imaging of the head and neck was performed using the standard protocol during bolus administration of intravenous contrast. Multiplanar CT image reconstructions and MIPs were obtained to evaluate the vascular anatomy. Carotid stenosis measurements (when applicable) are obtained utilizing NASCET criteria, using the distal internal carotid diameter as the denominator. CONTRAST:  50 mL Isovue 370 COMPARISON:  Brain MRI 12/08/2015, head CT 12/07/2015 FINDINGS: CT HEAD FINDINGS Brain: There is diffuse periventricular hypoattenuation compatible with advanced chronic microvascular disease. Small area of hypoattenuation in the left pons corresponds to the acute infarct seen on MRI. The other areas of acute ischemia are not visible by CT. No intracranial hemorrhage. Vascular: There is extensive calcification of the distal left vertebral artery/proximal basilar artery, in the region of the signal abnormality seen on the previous MRA. Skull: Negative Sinuses: Clear Orbits: Bilateral lens replacements. Review of the MIP images confirms the above findings CTA NECK FINDINGS Aortic arch: There is atherosclerotic calcification of the aortic arch and proximal arch vessels. There is a bovine variant branching pattern with the left common carotid and brachiocephalic artery sharing a common origin. Proximal subclavian arteries are normal. Right carotid system: There is atherosclerotic calcification of the right common carotid artery, including the carotid bifurcation. No hemodynamically significant stenosis of the right internal carotid artery. No aneurysm or dissection. Left carotid system: There is atherosclerotic plaquing  calcification at the left carotid bifurcation with stenosis measuring 40% by NASCET criteria. No aneurysm or dissection. Vertebral arteries: There is at least moderate narrowing of the left vertebral artery origin. The right vertebral artery origin is at least mildly narrowed. The vertebral system is left dominant. There is multifocal calcification within the left vertebral artery the results and approximately 55% narrowing at the C6 level. There is severe stenosis at the vertebral basilar confluence (series 502, image 209). The basilar artery remains patent however. This corresponds to the region of diminished flow related enhancement seen on the MRA. The right vertebral artery is diminutive distal to the origin of the right PICA. Skeleton: No bony spinal canal stenosis. Multilevel severe facet hypertrophy. Flowing anterior cervical osteophytes. Other neck: Unremarkable Upper chest: No pulmonary nodules or masses. Review of the MIP images confirms the above findings CTA HEAD FINDINGS Anterior circulation: --Intracranial internal carotid arteries: There is atherosclerotic calcification within the proximal intracranial internal carotid arteries bilaterally without hemodynamically significant stenosis. --Anterior cerebral arteries: Normal. --Middle cerebral arteries: There is again seen a superiorly projecting aneurysm arising from the right MCA bifurcation. The aneurysm measures 2 mm at its base and 5.4 mm from base to apex (series 503 image 86).  The left middle cerebral artery is normal. --Posterior communicating arteries: Absent bilaterally. Posterior circulation: --Posterior cerebral arteries: Normal. --Superior cerebellar arteries: Normal. 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 The distal basilar is patent. --Anterior inferior cerebellar arteries: Not visualized, which is not uncommon. --Posterior inferior cerebellar arteries: Normal. Venous  sinuses: As permitted by contrast timing, patent. Anatomic variants: None Delayed phase: No parenchymal contrast enhancement. Review of the MIP images confirms the above findings IMPRESSION: 1. 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 2. Right MCA bifurcation aneurysm projecting superiorly, measuring 5.4 mm from base to apex and 2 mm at its base. 3. Left proximal internal carotid artery stenosis measuring 40% by NASCET criteria. 4. Advanced chronic microvascular disease. Focal hypoattenuation at site of left pontine infarct. 5. Aortic atherosclerosis. Electronically Signed   By: Ulyses Jarred M.D.   On: 12/08/2015 23:05   Ct Angio Neck W Or Wo Contrast  Result Date: 12/08/2015 CLINICAL DATA:  Multiple small acute infarcts. EXAM: CT ANGIOGRAPHY HEAD AND NECK TECHNIQUE: Multidetector CT imaging of the head and neck was performed using the standard protocol during bolus administration of intravenous contrast. Multiplanar CT image reconstructions and MIPs were obtained to evaluate the vascular anatomy. Carotid stenosis measurements (when applicable) are obtained utilizing NASCET criteria, using the distal internal carotid diameter as the denominator. CONTRAST:  50 mL Isovue 370 COMPARISON:  Brain MRI 12/08/2015, head CT 12/07/2015 FINDINGS: CT HEAD FINDINGS Brain: There is diffuse periventricular hypoattenuation compatible with advanced chronic microvascular disease. Small area of hypoattenuation in the left pons corresponds to the acute infarct seen on MRI. The other areas of acute ischemia are not visible by CT. No intracranial hemorrhage. Vascular: There is extensive calcification of the distal left vertebral artery/proximal basilar artery, in the region of the signal abnormality seen on the previous MRA. Skull: Negative Sinuses: Clear Orbits: Bilateral lens replacements. Review of the MIP images confirms the above findings CTA  NECK FINDINGS Aortic arch: There is atherosclerotic calcification of the aortic arch and proximal arch vessels. There is a bovine variant branching pattern with the left common carotid and brachiocephalic artery sharing a common origin. Proximal subclavian arteries are normal. Right carotid system: There is atherosclerotic calcification of the right common carotid artery, including the carotid bifurcation. No hemodynamically significant stenosis of the right internal carotid artery. No aneurysm or dissection. Left carotid system: There is atherosclerotic plaquing calcification at the left carotid bifurcation with stenosis measuring 40% by NASCET criteria. No aneurysm or dissection. Vertebral arteries: There is at least moderate narrowing of the left vertebral artery origin. The right vertebral artery origin is at least mildly narrowed. The vertebral system is left dominant. There is multifocal calcification within the left vertebral artery the results and approximately 55% narrowing at the C6 level. There is severe stenosis at the vertebral basilar confluence (series 502, image 209). The basilar artery remains patent however. This corresponds to the region of diminished flow related enhancement seen on the MRA. The right vertebral artery is diminutive distal to the origin of the right PICA. Skeleton: No bony spinal canal stenosis. Multilevel severe facet hypertrophy. Flowing anterior cervical osteophytes. Other neck: Unremarkable Upper chest: No pulmonary nodules or masses. Review of the MIP images confirms the above findings CTA HEAD FINDINGS Anterior circulation: --Intracranial internal carotid arteries: There is atherosclerotic calcification within the proximal intracranial internal carotid arteries bilaterally without  hemodynamically significant stenosis. --Anterior cerebral arteries: Normal. --Middle cerebral arteries: There is again seen a superiorly projecting aneurysm arising from the right MCA bifurcation.  The aneurysm measures 2 mm at its base and 5.4 mm from base to apex (series 503 image 86). The left middle cerebral artery is normal. --Posterior communicating arteries: Absent bilaterally. Posterior circulation: --Posterior cerebral arteries: Normal. --Superior cerebellar arteries: Normal. 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 The distal basilar is patent. --Anterior inferior cerebellar arteries: Not visualized, which is not uncommon. --Posterior inferior cerebellar arteries: Normal. Venous sinuses: As permitted by contrast timing, patent. Anatomic variants: None Delayed phase: No parenchymal contrast enhancement. Review of the MIP images confirms the above findings IMPRESSION: 1. 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 2. Right MCA bifurcation aneurysm projecting superiorly, measuring 5.4 mm from base to apex and 2 mm at its base. 3. Left proximal internal carotid artery stenosis measuring 40% by NASCET criteria. 4. Advanced chronic microvascular disease. Focal hypoattenuation at site of left pontine infarct. 5. Aortic atherosclerosis. Electronically Signed   By: Ulyses Jarred M.D.   On: 12/08/2015 23:05    HOSPITAL DIAGNOSES: Principal Problem:   Confusion Active Problems:   Coronary artery disease involving native coronary artery with unstable angina pectoris (HCC)   COPD (chronic obstructive pulmonary disease) (HCC)   Essential hypertension   Cerebral thrombosis with cerebral infarction   Cerebral embolism with cerebral infarction   Intracerebral hemorrhage   Aneurysm (HCC)   Transient confusion   PAF (paroxysmal atrial fibrillation) (HCC)   IMPRESSION: 1. PAF - CHADSVASC score of 4 2. Recurrent stroke 3. S/p PCI with DES in 03/2015 (on ASA/Plavix) 4. Remote history of GI bleeding  RECOMMENDATION: 1. William Elliott has had recurrent  stroke and is found to be in atrial fibrillation which was corroborated by evidence on echocardiography. LVEF was preserved at 65-70%. He is CHADSVASC score is 4 putting him at increased risk for stroke. He did have a drug-eluting stent placement in January 2017 but is completed more than 6 months of aspirin and Plavix. At this point it's reasonable to discontinue aspirin and continue Plavix and add a DOAC. He is concerned about the cost of the new alternatives to warfarin and I would suggest Xarelto. Will ask care management to provide a co-pay assistance card which should give him a 30 day free supply and then he can get samples from our office in [place]. Follow-up with Dr. Johnny Bridge after discharge in [place].  Thanks for the consultation. Cardiology will be available as needed.  Time Spent Directly with Patient: 30 minutes  Pixie Casino, MD, [facility] Attending Cardiologist [place] 12/10/2015, 6:12 PM

## 2015-12-10 NOTE — Progress Notes (Signed)
PROGRESS NOTE    William Elliott  [id number] DOB: 07-02-1940 DOA: 12/07/2015 PCP: [name], MD    Brief Narrative:  William Elliott is a 75 y.o. male with a past medical history significant for CAD s/p PCI in Jan, HTN, COPD, and hx of TIA who presents with transient confusion.  The patient was in his usual state of health until this morning around 0330, his wife went to let the dogs out and when she came back to the bedroom she found him standing by the night stand using his albuterol inhaler. When he got back in bed, she noticed he was confused, asking questions about where his sons lived, where his grandsons were, and she found that he was somewhat disoriented. She checked his blood pressure which she noted was "high" (meaning that the lower number was greater than 60), so she was worried about stroke and brought him to the ER.  He had no slurred speech, no unilateral weakness, no numbness, no difficulty speaking, no loss of consciousness or seizure, no fall. He does take alprazolam 0.5 mg 2-4 times per day for anxiety which he has done for many years (has tried both sertraline and bupropion for anxiety which have both not helped). In emergency department  Patient was afebrile, heart rate 50s and 40s, sinus rhythm, respirations and pulse oximetry normal, slightly hypertensive. Na 134, K 3.7, Cr 0.9, WBC 6.1K, Hgb 14.4 Troponin negative  CXR and CT head unremarkable, ECG showed sinus bradycardia without ST changes, MRI was not available at [facility], and so [facility] were asked to evaluate for observation and expedited TIA workup at [facility]. Patient transferred to [facility] and was found to have CVA.  Workup for CVA including Carotid ultrasound and Echo was ordered.   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 -Place in telemetry -Neuro checks, NIHSS per protocol - Plavix and aspirin 325 mg -Carotid doppler: Bilateral: mild to moderate calcific plaque CCA. Moderate calcific plaque origin and proximal ICA and ECA. Right: 1-39% ICA plaquing. Left: 1-39% ICA stenosis at the bulb. Bilateral vertebral artery flow is antegrade. -MRI/MRA showed CVA and small aneurysm -Echocardiogram showed atrial fibrillation -Consult to Neurology, appreciate recommendations  Paroxysmal atrial fibrillation - per cardiology 2 anticoagulates  - will consult pharmacy for dosing of xarelto - will ensure patient can afford xarelto and does not have allergy to prior to discharge - no atrial fibrillation seen on telemetry   CAD:  -Continue Plavix -Continue Zetia (patient intolerant of statin)  HTN:  - Imdur, HCTZ, Losartan, amlodipine continued  Anxiety:  -May continue alprazolam as needed  GERD:  -Continue PPI   DVT prophylaxis: lovenox Code Status: Full code Family Communication: no family at bedside Disposition Plan: discharge likely in the next 24 hours   Consultants:   Neurology  Cardiology  Procedures:   Echo pending  Antimicrobials:   none    Subjective: Patient seen and evaluated.  His echocardiogram showed atrial fibrillation.  Telemetry strip has not shown atrial fibrillation. He is agreeable to staying.  He has not had chest pain, chest pressure, increased work of breathing, nausea,  vomiting, diarrhea. Patient voices he feels fine and complete resolution of symptoms.  Objective: Vitals:   12/10/15 0108 12/10/15 0520 12/10/15 0939 12/10/15 1439  BP: 122/68 127/69 (!) 121/54 102/64  Pulse: (!) 53 (!) 48 (!) 57 (!) 57  Resp: 20 20 20 19   Temp: 98.4 F (36.9 C) 98.2 F  (36.8 C) 98 F (36.7 C) 98.4 F (36.9 C)  TempSrc: Oral Oral Oral Oral  SpO2: 99% 98% 99% 98%  Weight:      Height:        Intake/Output Summary (Last 24 hours) at 12/10/15 1716 Last data filed at 12/10/15 0521  Gross per 24 hour  Intake              720 ml  Output                0 ml  Net              720 ml   Filed Weights   12/07/15 0459  Weight: 86.2 kg (190 lb)    Examination:  General exam: Appears calm and comfortable  Respiratory system: Clear to auscultation. Respiratory effort normal. Cardiovascular system: S1 & S2 heard, RRR. No JVD, murmurs, rubs, gallops or clicks. No pedal edema. Gastrointestinal system: Abdomen is nondistended, soft and nontender. No organomegaly or masses felt. Normal bowel sounds heard. Central nervous system: Alert and oriented. No focal neurological deficits. Extremities: Symmetric 5 x 5 power. No deformities, clubbing or cyanosis Skin: No rashes, lesions or ulcers Psychiatry: Judgement and insight appear normal. Mood & affect appropriate.  Answers all questions appropriately    Data Reviewed: I have personally reviewed following labs and imaging studies  CBC:  Recent Labs Lab 12/07/15 0456  WBC 6.1  NEUTROABS 4.1  HGB 14.4  HCT 41.6  MCV 92.2  PLT [id number]   Basic Metabolic Panel:  Recent Labs Lab 12/07/15 0456  NA 134*  K 3.7  CL 100*  CO2 27  GLUCOSE 107*  BUN 10  CREATININE 0.90  CALCIUM 9.3   GFR: Estimated Creatinine Clearance: 75.5 mL/min (by C-G formula based on SCr of 0.9 mg/dL). Liver Function Tests:  Recent Labs Lab 12/07/15 0456  AST 23  ALT 24  ALKPHOS 59  BILITOT 0.8  PROT 7.3  ALBUMIN 4.3   No results for input(s): LIPASE, AMYLASE in the last 168 hours. No results for input(s): AMMONIA in the last 168 hours. Coagulation Profile: No results for input(s): INR, PROTIME in the last 168 hours. Cardiac Enzymes:  Recent Labs Lab 12/07/15 0456  TROPONINI <0.03   BNP (last 3 results) No  results for input(s): PROBNP in the last 8760 hours. HbA1C:  Recent Labs  12/09/15 0616  HGBA1C 5.5   CBG:  Recent Labs Lab 12/07/15 1654 12/07/15 2120 12/08/15 0615 12/08/15 1135 12/08/15 1715  GLUCAP 81 86 97 96 117*   Lipid Profile:  Recent Labs  12/09/15 0616  CHOL 176  HDL 30*  LDLCALC 115*  TRIG 157*  CHOLHDL 5.9   Thyroid Function Tests: No results for input(s): TSH, T4TOTAL, FREET4, T3FREE, THYROIDAB in the last 72 hours. Anemia Panel: No results for input(s): VITAMINB12, FOLATE, FERRITIN, TIBC, IRON, RETICCTPCT in the last 72 hours. Sepsis Labs: No results for input(s): PROCALCITON, LATICACIDVEN in the last 168 hours.  No results found for this or any previous visit (from the past 240 hour(s)).       Radiology Studies: Ct Angio Head W Or Wo Contrast  Result Date:  12/08/2015 CLINICAL DATA:  Multiple small acute infarcts. EXAM: CT ANGIOGRAPHY HEAD AND NECK TECHNIQUE: Multidetector CT imaging of the head and neck was performed using the standard protocol during bolus administration of intravenous contrast. Multiplanar CT image reconstructions and MIPs were obtained to evaluate the vascular anatomy. Carotid stenosis measurements (when applicable) are obtained utilizing NASCET criteria, using the distal internal carotid diameter as the denominator. CONTRAST:  50 mL Isovue 370 COMPARISON:  Brain MRI 12/08/2015, head CT 12/07/2015 FINDINGS: CT HEAD FINDINGS Brain: There is diffuse periventricular hypoattenuation compatible with advanced chronic microvascular disease. Small area of hypoattenuation in the left pons corresponds to the acute infarct seen on MRI. The other areas of acute ischemia are not visible by CT. No intracranial hemorrhage. Vascular: There is extensive calcification of the distal left vertebral artery/proximal basilar artery, in the region of the signal abnormality seen on the previous MRA. Skull: Negative Sinuses: Clear Orbits: Bilateral lens  replacements. Review of the MIP images confirms the above findings CTA NECK FINDINGS Aortic arch: There is atherosclerotic calcification of the aortic arch and proximal arch vessels. There is a bovine variant branching pattern with the left common carotid and brachiocephalic artery sharing a common origin. Proximal subclavian arteries are normal. Right carotid system: There is atherosclerotic calcification of the right common carotid artery, including the carotid bifurcation. No hemodynamically significant stenosis of the right internal carotid artery. No aneurysm or dissection. Left carotid system: There is atherosclerotic plaquing calcification at the left carotid bifurcation with stenosis measuring 40% by NASCET criteria. No aneurysm or dissection. Vertebral arteries: There is at least moderate narrowing of the left vertebral artery origin. The right vertebral artery origin is at least mildly narrowed. The vertebral system is left dominant. There is multifocal calcification within the left vertebral artery the results and approximately 55% narrowing at the C6 level. There is severe stenosis at the vertebral basilar confluence (series 502, image 209). The basilar artery remains patent however. This corresponds to the region of diminished flow related enhancement seen on the MRA. The right vertebral artery is diminutive distal to the origin of the right PICA. Skeleton: No bony spinal canal stenosis. Multilevel severe facet hypertrophy. Flowing anterior cervical osteophytes. Other neck: Unremarkable Upper chest: No pulmonary nodules or masses. Review of the MIP images confirms the above findings CTA HEAD FINDINGS Anterior circulation: --Intracranial internal carotid arteries: There is atherosclerotic calcification within the proximal intracranial internal carotid arteries bilaterally without hemodynamically significant stenosis. --Anterior cerebral arteries: Normal. --Middle cerebral arteries: There is again seen a  superiorly projecting aneurysm arising from the right MCA bifurcation. The aneurysm measures 2 mm at its base and 5.4 mm from base to apex (series 503 image 86). The left middle cerebral artery is normal. --Posterior communicating arteries: Absent bilaterally. Posterior circulation: --Posterior cerebral arteries: Normal. --Superior cerebellar arteries: Normal. 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 The distal basilar is patent. --Anterior inferior cerebellar arteries: Not visualized, which is not uncommon. --Posterior inferior cerebellar arteries: Normal. Venous sinuses: As permitted by contrast timing, patent. Anatomic variants: None Delayed phase: No parenchymal contrast enhancement. Review of the MIP images confirms the above findings IMPRESSION: 1. 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 2. Right MCA bifurcation aneurysm projecting superiorly, measuring 5.4 mm from base to apex and 2 mm at its base. 3. Left proximal internal carotid artery stenosis measuring 40% by NASCET  criteria. 4. Advanced chronic microvascular disease. Focal hypoattenuation at site of left pontine infarct. 5. Aortic atherosclerosis. Electronically Signed   By: Ulyses Jarred M.D.   On: 12/08/2015 23:05   Ct Angio Neck W Or Wo Contrast  Result Date: 12/08/2015 CLINICAL DATA:  Multiple small acute infarcts. EXAM: CT ANGIOGRAPHY HEAD AND NECK TECHNIQUE: Multidetector CT imaging of the head and neck was performed using the standard protocol during bolus administration of intravenous contrast. Multiplanar CT image reconstructions and MIPs were obtained to evaluate the vascular anatomy. Carotid stenosis measurements (when applicable) are obtained utilizing NASCET criteria, using the distal internal carotid diameter as the denominator. CONTRAST:  50 mL Isovue 370 COMPARISON:  Brain MRI  12/08/2015, head CT 12/07/2015 FINDINGS: CT HEAD FINDINGS Brain: There is diffuse periventricular hypoattenuation compatible with advanced chronic microvascular disease. Small area of hypoattenuation in the left pons corresponds to the acute infarct seen on MRI. The other areas of acute ischemia are not visible by CT. No intracranial hemorrhage. Vascular: There is extensive calcification of the distal left vertebral artery/proximal basilar artery, in the region of the signal abnormality seen on the previous MRA. Skull: Negative Sinuses: Clear Orbits: Bilateral lens replacements. Review of the MIP images confirms the above findings CTA NECK FINDINGS Aortic arch: There is atherosclerotic calcification of the aortic arch and proximal arch vessels. There is a bovine variant branching pattern with the left common carotid and brachiocephalic artery sharing a common origin. Proximal subclavian arteries are normal. Right carotid system: There is atherosclerotic calcification of the right common carotid artery, including the carotid bifurcation. No hemodynamically significant stenosis of the right internal carotid artery. No aneurysm or dissection. Left carotid system: There is atherosclerotic plaquing calcification at the left carotid bifurcation with stenosis measuring 40% by NASCET criteria. No aneurysm or dissection. Vertebral arteries: There is at least moderate narrowing of the left vertebral artery origin. The right vertebral artery origin is at least mildly narrowed. The vertebral system is left dominant. There is multifocal calcification within the left vertebral artery the results and approximately 55% narrowing at the C6 level. There is severe stenosis at the vertebral basilar confluence (series 502, image 209). The basilar artery remains patent however. This corresponds to the region of diminished flow related enhancement seen on the MRA. The right vertebral artery is diminutive distal to the origin of the right  PICA. Skeleton: No bony spinal canal stenosis. Multilevel severe facet hypertrophy. Flowing anterior cervical osteophytes. Other neck: Unremarkable Upper chest: No pulmonary nodules or masses. Review of the MIP images confirms the above findings CTA HEAD FINDINGS Anterior circulation: --Intracranial internal carotid arteries: There is atherosclerotic calcification within the proximal intracranial internal carotid arteries bilaterally without hemodynamically significant stenosis. --Anterior cerebral arteries: Normal. --Middle cerebral arteries: There is again seen a superiorly projecting aneurysm arising from the right MCA bifurcation. The aneurysm measures 2 mm at its base and 5.4 mm from base to apex (series 503 image 86). The left middle cerebral artery is normal. --Posterior communicating arteries: Absent bilaterally. Posterior circulation: --Posterior cerebral arteries: Normal. --Superior cerebellar arteries: Normal. 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 The distal basilar is patent. --Anterior inferior cerebellar arteries: Not visualized, which is not uncommon. --Posterior inferior cerebellar arteries: Normal. Venous sinuses: As permitted by contrast timing, patent. Anatomic variants: None Delayed phase: No parenchymal contrast enhancement. Review of the MIP images confirms the above findings IMPRESSION: 1. 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 2. Right MCA bifurcation aneurysm projecting superiorly, measuring 5.4 mm from base to apex and 2 mm at its base. 3. Left proximal internal carotid artery stenosis measuring 40% by NASCET criteria. 4. Advanced chronic microvascular disease. Focal hypoattenuation at site of left pontine infarct. 5. Aortic atherosclerosis. Electronically Signed   By: Ulyses Jarred M.D.   On: 12/08/2015 23:05        Scheduled  Meds: . amLODipine  5 mg Oral Daily  . aspirin EC  325 mg Oral Daily  . clopidogrel  75 mg Oral Q breakfast  . enoxaparin (LOVENOX) injection  40 mg Subcutaneous Q24H  . ezetimibe  10 mg Oral Daily  . hydrochlorothiazide  25 mg Oral Daily  . isosorbide mononitrate  30 mg Oral Daily  . losartan  50 mg Oral Daily  . pantoprazole  40 mg Oral Daily  . potassium chloride SA  20 mEq Oral BID   Continuous Infusions:    LOS: 2 days    Time spent: 45 minutes    Newman Pies, MD [facility] Pager [phone]  If 7PM-7AM, please contact night-coverage [email or web address] Password [email or web address] 12/10/2015, 5:16 PM

## 2015-12-10 NOTE — Evaluation (Signed)
Physical Therapy Evaluation Patient Details Name: William Elliott MRN: [id number] DOB: [birth year] Today's Date: 12/10/2015   History of Present Illness  William Elliott is a 75 y.o. male with a past medical history significant for CAD s/p PCI in Jan, HTN, COPD, and hx of TIA who presents with transient confusion.  Clinical Impression  Pt admitted with above however symptoms see to have resolved. Pt at minimal falls risk as demo'd by score of 20 on DGI. Acute PT to follow for higher level balance.     Follow Up Recommendations No PT follow up    Equipment Recommendations  None recommended by PT    Recommendations for Other Services       Precautions / Restrictions Precautions Precautions: Fall Restrictions Weight Bearing Restrictions: No      Mobility  Bed Mobility Overal bed mobility: Modified Independent                Transfers Overall transfer level: Modified independent Equipment used: None             General transfer comment: used UE and demo'd slight increase in time  Ambulation/Gait Ambulation/Gait assistance: Min guard Ambulation Distance (Feet): 200 Feet Assistive device: None Gait Pattern/deviations: Step-through pattern;WFL(Within Functional Limits) Gait velocity: slow Gait velocity interpretation: Below normal speed for age/gender General Gait Details: pt with mild lateral swaying but no overt episode of loss of balance  Stairs Stairs: Yes Stairs assistance: Min guard Stair Management: One rail Right Number of Stairs: 3    Wheelchair Mobility    Modified Rankin (Stroke Patients Only) Modified Rankin (Stroke Patients Only) Pre-Morbid Rankin Score: No symptoms Modified Rankin: Slight disability     Balance                                 Standardized Balance Assessment Standardized Balance Assessment : Dynamic Gait Index   Dynamic Gait Index Level Surface: Normal Change in Gait Speed: Normal Gait with  Horizontal Head Turns: Normal Gait with Vertical Head Turns: Normal Gait and Pivot Turn: Mild Impairment Step Over Obstacle: Mild Impairment Step Around Obstacles: Mild Impairment Steps: Mild Impairment Total Score: 20       Pertinent Vitals/Pain Pain Assessment: No/denies pain    Home Living Family/patient expects to be discharged to:: Private residence Living Arrangements: Spouse/significant other Available Help at Discharge: Family;Available 24 hours/day Type of Home: House Home Access: Stairs to enter Entrance Stairs-Rails: Right Entrance Stairs-Number of Steps: 3 Home Layout: One level        Prior Function Level of Independence: Independent         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 Dominance   Dominant Hand: Right    Extremity/Trunk Assessment   Upper Extremity Assessment: Overall WFL for tasks assessed           Lower Extremity Assessment: Overall WFL for tasks assessed      Cervical / Trunk Assessment: Normal  Communication   Communication: No difficulties  Cognition Arousal/Alertness: Awake/alert Behavior During Therapy: WFL for tasks assessed/performed Overall Cognitive Status: Within Functional Limits for tasks assessed                      General Comments      Exercises     Assessment/Plan    PT Assessment Patient needs continued PT services  PT Problem List Decreased strength;Decreased balance;Decreased mobility;Decreased coordination  PT Treatment Interventions DME instruction;Gait training;Stair training;Functional mobility training;Therapeutic activities;Therapeutic exercise;Balance training;Neuromuscular re-education    PT Goals (Current goals can be found in the Care Plan section)  Acute Rehab PT Goals Patient Stated Goal: home today PT Goal Formulation: With patient Time For Goal Achievement: 12/17/15 Potential to Achieve Goals: Good Additional Goals Additional Goal #1: Pt to score >49 on Berg  Balance test to indicate minimal falls risk.    Frequency Min 4X/week   Barriers to discharge        Co-evaluation               End of Session Equipment Utilized During Treatment: Gait belt Activity Tolerance: Patient tolerated treatment well Patient left: in chair;with call bell/phone within reach Nurse Communication: Mobility status         Time: 1122-1140 PT Time Calculation (min) (ACUTE ONLY): 18 min   Charges:   PT Evaluation $PT Eval Moderate Complexity: 1 Procedure     PT G CodesKingsley Callander 12/10/2015, 2:09 PM   [name], PT, DPT Pager #: [phone] Office #: [phone]

## 2015-12-10 NOTE — Care Management Note (Signed)
Case Management Note  Patient Details  Name: William Elliott MRN: [id number] Date of Birth: 1940/06/18  Subjective/Objective:     Pt admitted with CVA. He is from home with his spouse.                Action/Plan: No f/u per PT. Awaiting OT recs. CM following for d/c needs.   Expected Discharge Date:                  Expected Discharge Plan:  Home/Self Care  In-House Referral:     Discharge planning Services     Post Acute Care Choice:    Choice offered to:     DME Arranged:    DME Agency:     HH Arranged:    HH Agency:     Status of Service:  In process, will continue to follow  If discussed at Long Length of Stay Meetings, dates discussed:    Additional Comments:  Pollie Friar, RN 12/10/2015, 3:10 PM

## 2015-12-10 NOTE — Plan of Care (Addendum)
Informed by primary team Dr. Jearld Shines that pt was found to have afib during TTE exam. Cardiology confirmed the afib diagnosis. Agree with anticoagulation with Xarelto. Due to hx of CAD s/p CABG and stent, agree with plavix on top of Xarelto. No need 30 day cardiac event monitoring as outpt, please cancel that.   Neurology will sign off. Please call with questions. Pt will follow up with Cecille Rubin NP at [facility] in about 6 weeks (order written).   Rosalin Hawking, MD PhD Stroke Neurology 12/10/2015 6:17 PM

## 2015-12-11 DIAGNOSIS — I480 Paroxysmal atrial fibrillation: Secondary | ICD-10-CM

## 2015-12-11 DIAGNOSIS — R410 Disorientation, unspecified: Secondary | ICD-10-CM

## 2015-12-11 MED ORDER — RIVAROXABAN (XARELTO) EDUCATION KIT FOR AFIB PATIENTS: 1.0000 | PACK | Freq: Once | 0 refills | Status: AC

## 2015-12-11 MED ORDER — RIVAROXABAN 20 MG PO TABS: 20.0000 mg | ORAL_TABLET | Freq: Every day | ORAL | Status: DC

## 2015-12-11 MED ORDER — RIVAROXABAN 20 MG PO TABS: 20.0000 mg | ORAL_TABLET | Freq: Every day | ORAL | 0 refills | Status: DC

## 2015-12-11 MED ORDER — RIVAROXABAN (XARELTO) EDUCATION KIT FOR AFIB PATIENTS
PACK | Freq: Once | Status: DC
  Filled 2015-12-11: qty 1

## 2015-12-11 NOTE — Progress Notes (Signed)
CCMD called RN to notify of 2.10 second pause. RN rounded on patient, no changes with patient at this time. Will continue to monitor.

## 2015-12-11 NOTE — Progress Notes (Signed)
Pt discharged home. Pt's sister coming to Hospital to visit patient and patient will have sister transport home. Discharge information, prescription and Xarelto starter kit given to patient. IV and telemetry discontinued. Patient questions asked and answered. Pt will notify front desk when transportation arrives. Wendee Copp

## 2015-12-11 NOTE — Care Management Note (Signed)
Case Management Note  Patient Details  Name: William Elliott MRN: [id number] Date of Birth: 1940/05/26  Subjective/Objective:                    Action/Plan: Patient discharging home with self care. Pt states his sister will transport him home. Pt to start on Xarelto. CM did a benefits check that revealed a co pay of $3 due to his Medicaid. Pt good with the cost. CM provided him the 30 day free coupon. Bedside RN updated.  Expected Discharge Date:                  Expected Discharge Plan:  Home/Self Care  In-House Referral:     Discharge planning Services     Post Acute Care Choice:    Choice offered to:     DME Arranged:    DME Agency:     HH Arranged:    [place] Agency:     Status of Service:  Completed, signed off  If discussed at [employer] of Stay Meetings, dates discussed:    Additional Comments:  Pollie Friar, RN 12/11/2015, 1:17 PM

## 2015-12-11 NOTE — Discharge Summary (Signed)
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 The patient was in his usual state of health until this morning around 0330, his wife went to let the dogs out and when she came back to the bedroom she found him standing by the night stand using his albuterol inhaler. When he got back in bed, she noticed he was confused, asking questions about where his sons lived, where his grandsons were, and she found that he was somewhat disoriented. She checked his blood pressure which she noted was "high" (meaning that the lower number was greater than 60), so she was worried about stroke and brought him to the ER. He had no slurred speech, no unilateral weakness, no numbness, no difficulty speaking, no loss of consciousness or seizure, no fall.He does take alprazolam 0.5 mg2-4 times per day for anxiety which hehas done for many years (has tried both sertraline and bupropion for anxiety which haveboth not helped). In emergency department  Patient was afebrile, heart rate  50s and 40s, sinus rhythm, respirations and pulse oximetry normal, slightly hypertensive. Na 134, K 3.7, Cr 0.9, WBC 6.1K, Hgb 14.4 Troponin negative  CXR and CT head unremarkable, ECG showed sinus bradycardia without ST changes, MRI was not available at [facility], and so [facility] were asked to evaluate for observation and expedited TIA workup at [facility]. Patient transferred to [facility] and was found to have CVA.  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 No acute hemorrhage or mass effect. No intracranial arterial occlusion. Diminished flow related enhancement within the basilar artery may indicate a degree of stenosis. 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 Advanced chronic microvascular ischemia. 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 Right MCA bifurcation aneurysm projecting superiorly, measuring 5.4 mm from base to apex and 2 mm at its base. Left proximal internal carotid artery stenosis measuring 40% by NASCET criteria. Advanced chronic microvascular disease. Focal hypoattenuation at site of left pontine infarct. Aortic atherosclerosis. -Carotid doppler: Bilateral: mild to moderate calcific plaque CCA. Moderate calcific plaque origin and proximal ICA and ECA. Right: 1-39% ICA plaquing. Left: 1-39% ICA stenosis at the bulb. Bilateral vertebral artery flow is antegrade. -Echocardiogram showed atrial fibrillation, EF 65% - Per cardiology, patient will continue Plavix xarelto - Continue zetia, pt  has statin intolerance   Paroxysmal atrial fibrillation - CHADS vasc score 4 - Continue anticoagulation with xarelto - Rate  controlled with zbeta   CAD, native coronary artery without angina pectoris / S/p PCI with DES in 03/2015 - Continue Plavix, per cardio, okay to stop aspirin and continue plavix along with xarelto  - Continue Zetia (patient intolerant of statin)  Essential hypertension  - Continue home meds   Anxiety - May continue alprazolam as needed  GERD - Continue PPI   DVT prophylaxis: Lovenox subQ in hospital  Code Status: Full code Family Communication: no family at bedside    Consultants:   Neurology  Cardiology  Procedures:   Echo - EF 65%  Antimicrobials:   None    Signed:  Leisa Lenz, MD  [facility] 12/11/2015, 11:46 AM  Pager #: [phone]  Time spent in minutes: less than 30 minutes    Discharge Exam: Vitals:   12/11/15 0610 12/11/15 1020  BP: 126/70 135/72  Pulse:  (!) 53  Resp:  16  Temp:  98.3 F (36.8 C)   Vitals:   12/11/15 0059 12/11/15 0607 12/11/15 0610 12/11/15 1020  BP: 130/64 (!) 138/101 126/70 135/72  Pulse: (!) 57 (!) 52  (!) 53  Resp: '16 16  16  '$ Temp: 97.6 F (36.4 C) 97.8 F (36.6 C)  98.3 F (36.8 C)  TempSrc: Oral Oral  Oral  SpO2: 98% 97%  97%  Weight:      Height:        General: Pt is alert, follows commands appropriately, not in acute distress Cardiovascular: Rate controlled, S1/S2 +, left carotid bruit (+) Respiratory: Clear to auscultation bilaterally, no wheezing, no crackles, no rhonchi Abdominal: Soft, non tender, non distended, bowel sounds +, no guarding Extremities: no cyanosis, pulses palpable bilaterally DP and PT Neuro: Grossly nonfocal  Discharge Instructions  Discharge Instructions    Ambulatory referral to Neurology    Complete by:  As directed    Follow up with NP Cecille Rubin at [facility] in about 6 weeks. Thanks.   Ambulatory referral to Neurology    Complete by:  As directed    An appointment is requested in approximately: 6 weeks   Call MD for:  difficulty breathing, headache  or visual disturbances    Complete by:  As directed    Call MD for:  extreme fatigue    Complete by:  As directed    Call MD for:  persistant dizziness or light-headedness    Complete by:  As directed    Call MD for:  persistant nausea and vomiting    Complete by:  As directed    Call MD for:  persistant nausea and vomiting    Complete by:  As directed    Call MD for:  redness, tenderness, or signs of infection (pain, swelling, redness, odor or green/yellow discharge around incision site)    Complete by:  As directed    Call MD for:  severe uncontrolled pain    Complete by:  As directed    Call MD for:  severe uncontrolled pain    Complete by:  As directed    Call MD for:  temperature >100.4    Complete by:  As directed    Diet - low sodium heart healthy    Complete by:  As directed    Diet - low sodium heart healthy    Complete by:  As directed    Discharge instructions    Complete  by:  As directed    Continue xarelto on discharge as recommended by cardiology   Increase activity slowly    Complete by:  As directed    Increase activity slowly    Complete by:  As directed        Medication List    STOP taking these medications   aspirin 81 MG tablet     TAKE these medications   Albuterol Sulfate 108 (90 Base) MCG/ACT Aepb Inhale 2 puffs into the lungs 4 (four) times daily as needed (wheezing and sob).   ALPRAZolam 1 MG tablet Commonly known as:  XANAX TAKE 1 TABLET BY MOUTH FOUR TIMES DAILY   amLODipine 5 MG tablet Commonly known as:  NORVASC Take 1 tablet (5 mg total) by mouth daily.   b complex vitamins tablet Take 1 tablet by mouth daily.   bisoprolol 5 MG tablet Commonly known as:  ZEBETA Take 5 mg by mouth daily.   clopidogrel 75 MG tablet Commonly known as:  PLAVIX Take 1 tablet (75 mg total) by mouth daily with breakfast.   ezetimibe 10 MG tablet Commonly known as:  ZETIA Take 1 tablet (10 mg total) by mouth daily.   fluticasone 50 MCG/ACT nasal  spray Commonly known as:  FLONASE USE 2 SPRAYS INTO EACH NOSTRIL DAILY.   hydrochlorothiazide 25 MG tablet Commonly known as:  HYDRODIURIL Take 1 tablet (25 mg total) by mouth daily.   isosorbide mononitrate 30 MG 24 hr tablet Commonly known as:  IMDUR Take 1 tablet (30 mg total) by mouth daily.   losartan 100 MG tablet Commonly known as:  COZAAR Take 0.5 tablets (50 mg total) by mouth daily.   multivitamin per tablet Take 1 tablet by mouth daily.   nitroGLYCERIN 0.4 MG SL tablet Commonly known as:  NITROSTAT Place 1 tablet (0.4 mg total) under the tongue every 5 (five) minutes as needed for chest pain.   omega-3 acid ethyl esters 1 g capsule Commonly known as:  LOVAZA TAKE 2 CAPSULES BY MOUTH TWICE DAILY AS NEEDED   pantoprazole 40 MG tablet Commonly known as:  PROTONIX Take 1 tablet (40 mg total) by mouth daily.   potassium chloride SA 20 MEQ tablet Commonly known as:  K-DUR,KLOR-CON Take 1 tablet (20 mEq total) by mouth 2 (two) times daily.   rivaroxaban 20 MG Tabs tablet Commonly known as:  XARELTO Take 1 tablet (20 mg total) by mouth daily with supper.   rivaroxaban Kit Commonly known as:  XARELTO 1 kit by Does not apply route once.        Follow-up Information    Dennie Bible, NP. Schedule an appointment as soon as possible for a visit in 6 week(s).   Specialty:  Family Medicine Why:  stroke follow up Contact information: [address] [place] [address] [phone]        [name], MD. Schedule an appointment as soon as possible for a visit in 1 week(s).   Specialty:  Internal Medicine Contact information: [place] [address] [phone]            The results of significant diagnostics from this hospitalization (including imaging, microbiology, ancillary and laboratory) are listed below for reference.    Significant Diagnostic Studies: Ct Angio Head W Or Wo Contrast  Result Date:  12/08/2015 CLINICAL DATA:  Multiple small acute infarcts. EXAM: CT ANGIOGRAPHY HEAD AND NECK TECHNIQUE: Multidetector CT imaging of the head and neck was performed using the  standard protocol during bolus administration of intravenous contrast. Multiplanar CT image reconstructions and MIPs were obtained to evaluate the vascular anatomy. Carotid stenosis measurements (when applicable) are obtained utilizing NASCET criteria, using the distal internal carotid diameter as the denominator. CONTRAST:  50 mL Isovue 370 COMPARISON:  Brain MRI 12/08/2015, head CT 12/07/2015 FINDINGS: CT HEAD FINDINGS Brain: There is diffuse periventricular hypoattenuation compatible with advanced chronic microvascular disease. Small area of hypoattenuation in the left pons corresponds to the acute infarct seen on MRI. The other areas of acute ischemia are not visible by CT. No intracranial hemorrhage. Vascular: There is extensive calcification of the distal left vertebral artery/proximal basilar artery, in the region of the signal abnormality seen on the previous MRA. Skull: Negative Sinuses: Clear Orbits: Bilateral lens replacements. Review of the MIP images confirms the above findings CTA NECK FINDINGS Aortic arch: There is atherosclerotic calcification of the aortic arch and proximal arch vessels. There is a bovine variant branching pattern with the left common carotid and brachiocephalic artery sharing a common origin. Proximal subclavian arteries are normal. Right carotid system: There is atherosclerotic calcification of the right common carotid artery, including the carotid bifurcation. No hemodynamically significant stenosis of the right internal carotid artery. No aneurysm or dissection. Left carotid system: There is atherosclerotic plaquing calcification at the left carotid bifurcation with stenosis measuring 40% by NASCET criteria. No aneurysm or dissection. Vertebral arteries: There is at least moderate narrowing of the left  vertebral artery origin. The right vertebral artery origin is at least mildly narrowed. The vertebral system is left dominant. There is multifocal calcification within the left vertebral artery the results and approximately 55% narrowing at the C6 level. There is severe stenosis at the vertebral basilar confluence (series 502, image 209). The basilar artery remains patent however. This corresponds to the region of diminished flow related enhancement seen on the MRA. The right vertebral artery is diminutive distal to the origin of the right PICA. Skeleton: No bony spinal canal stenosis. Multilevel severe facet hypertrophy. Flowing anterior cervical osteophytes. Other neck: Unremarkable Upper chest: No pulmonary nodules or masses. Review of the MIP images confirms the above findings CTA HEAD FINDINGS Anterior circulation: --Intracranial internal carotid arteries: There is atherosclerotic calcification within the proximal intracranial internal carotid arteries bilaterally without hemodynamically significant stenosis. --Anterior cerebral arteries: Normal. --Middle cerebral arteries: There is again seen a superiorly projecting aneurysm arising from the right MCA bifurcation. The aneurysm measures 2 mm at its base and 5.4 mm from base to apex (series 503 image 86). The left middle cerebral artery is normal. --Posterior communicating arteries: Absent bilaterally. Posterior circulation: --Posterior cerebral arteries: Normal. --Superior cerebellar arteries: Normal. 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 The distal basilar is patent. --Anterior inferior cerebellar arteries: Not visualized, which is not uncommon. --Posterior inferior cerebellar arteries: Normal. Venous sinuses: As permitted by contrast timing, patent. Anatomic variants: None Delayed phase: No parenchymal contrast enhancement. Review of the MIP images confirms the above findings IMPRESSION: 1.  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 2. Right MCA bifurcation aneurysm projecting superiorly, measuring 5.4 mm from base to apex and 2 mm at its base. 3. Left proximal internal carotid artery stenosis measuring 40% by NASCET criteria. 4. Advanced chronic microvascular disease. Focal hypoattenuation at site of left pontine infarct. 5. Aortic atherosclerosis. Electronically Signed   By: Cletus Gash.D.  On: 12/08/2015 23:05   Dg Chest 2 View  Result Date: 12/07/2015 CLINICAL DATA:  Confusion.  Headache. EXAM: CHEST  2 VIEW COMPARISON:  Chest radiograph 03/17/2015 FINDINGS: Median sternotomy wires and CABG markers are again seen. No pleural effusion or pneumothorax. No pulmonary edema or focal airspace consolidation. IMPRESSION: No active cardiopulmonary disease. Electronically Signed   By: Ulyses Jarred M.D.   On: 12/07/2015 05:52   Ct Head Wo Contrast  Result Date: 12/07/2015 CLINICAL DATA:  Confusion.  Headache. EXAM: CT HEAD WITHOUT CONTRAST TECHNIQUE: Contiguous axial images were obtained from the base of the skull through the vertex without intravenous contrast. COMPARISON:  None. FINDINGS: Brain: No mass lesion, intraparenchymal hemorrhage or extra-axial collection. No evidence of acute cortical infarct. There is periventricular hypoattenuation compatible with chronic microvascular disease. There is an old left basal ganglia lacunar infarct. Vascular: Marked calcification of the left vertebral artery. Mild internal carotid artery calcification. Skull: Normal visualized skull base, calvarium and extracranial soft tissues. Sinuses/Orbits: No sinus fluid levels or advanced mucosal thickening. No mastoid effusion. Normal orbits. IMPRESSION: 1. No acute intracranial abnormality. 2. Advanced chronic microvascular disease. Electronically Signed   By: Ulyses Jarred M.D.   On: 12/07/2015 05:51   Ct Angio Neck W Or Wo  Contrast  Result Date: 12/08/2015 CLINICAL DATA:  Multiple small acute infarcts. EXAM: CT ANGIOGRAPHY HEAD AND NECK TECHNIQUE: Multidetector CT imaging of the head and neck was performed using the standard protocol during bolus administration of intravenous contrast. Multiplanar CT image reconstructions and MIPs were obtained to evaluate the vascular anatomy. Carotid stenosis measurements (when applicable) are obtained utilizing NASCET criteria, using the distal internal carotid diameter as the denominator. CONTRAST:  50 mL Isovue 370 COMPARISON:  Brain MRI 12/08/2015, head CT 12/07/2015 FINDINGS: CT HEAD FINDINGS Brain: There is diffuse periventricular hypoattenuation compatible with advanced chronic microvascular disease. Small area of hypoattenuation in the left pons corresponds to the acute infarct seen on MRI. The other areas of acute ischemia are not visible by CT. No intracranial hemorrhage. Vascular: There is extensive calcification of the distal left vertebral artery/proximal basilar artery, in the region of the signal abnormality seen on the previous MRA. Skull: Negative Sinuses: Clear Orbits: Bilateral lens replacements. Review of the MIP images confirms the above findings CTA NECK FINDINGS Aortic arch: There is atherosclerotic calcification of the aortic arch and proximal arch vessels. There is a bovine variant branching pattern with the left common carotid and brachiocephalic artery sharing a common origin. Proximal subclavian arteries are normal. Right carotid system: There is atherosclerotic calcification of the right common carotid artery, including the carotid bifurcation. No hemodynamically significant stenosis of the right internal carotid artery. No aneurysm or dissection. Left carotid system: There is atherosclerotic plaquing calcification at the left carotid bifurcation with stenosis measuring 40% by NASCET criteria. No aneurysm or dissection. Vertebral arteries: There is at least moderate  narrowing of the left vertebral artery origin. The right vertebral artery origin is at least mildly narrowed. The vertebral system is left dominant. There is multifocal calcification within the left vertebral artery the results and approximately 55% narrowing at the C6 level. There is severe stenosis at the vertebral basilar confluence (series 502, image 209). The basilar artery remains patent however. This corresponds to the region of diminished flow related enhancement seen on the MRA. The right vertebral artery is diminutive distal to the origin of the right PICA. Skeleton: No bony spinal canal stenosis. Multilevel severe facet hypertrophy. Flowing anterior cervical osteophytes. Other neck: Unremarkable  Upper chest: No pulmonary nodules or masses. Review of the MIP images confirms the above findings CTA HEAD FINDINGS Anterior circulation: --Intracranial internal carotid arteries: There is atherosclerotic calcification within the proximal intracranial internal carotid arteries bilaterally without hemodynamically significant stenosis. --Anterior cerebral arteries: Normal. --Middle cerebral arteries: There is again seen a superiorly projecting aneurysm arising from the right MCA bifurcation. The aneurysm measures 2 mm at its base and 5.4 mm from base to apex (series 503 image 86). The left middle cerebral artery is normal. --Posterior communicating arteries: Absent bilaterally. Posterior circulation: --Posterior cerebral arteries: Normal. --Superior cerebellar arteries: Normal. 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 The distal basilar is patent. --Anterior inferior cerebellar arteries: Not visualized, which is not uncommon. --Posterior inferior cerebellar arteries: Normal. Venous sinuses: As permitted by contrast timing, patent. Anatomic variants: None Delayed phase: No parenchymal contrast enhancement. Review of the MIP images confirms the above  findings IMPRESSION: 1. 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 2. Right MCA bifurcation aneurysm projecting superiorly, measuring 5.4 mm from base to apex and 2 mm at its base. 3. Left proximal internal carotid artery stenosis measuring 40% by NASCET criteria. 4. Advanced chronic microvascular disease. Focal hypoattenuation at site of left pontine infarct. 5. Aortic atherosclerosis. Electronically Signed   By: Deatra Robinson M.D.   On: 12/08/2015 23:05   Mr Brain Wo Contrast  Result Date: 12/08/2015 CLINICAL DATA:  Altered mental status.  Suspected TIA. EXAM: MRI HEAD WITHOUT CONTRAST MRA HEAD WITHOUT CONTRAST TECHNIQUE: Multiplanar, multiecho pulse sequences of the brain and surrounding structures were obtained without intravenous contrast. Angiographic images of the head were obtained using MRA technique without contrast. COMPARISON:  Head CT 12/07/2015 FINDINGS: MRI HEAD FINDINGS Brain: There are multiple focal areas of diffusion restriction, located within the left pons, right occipital lobe and superior left thalamus. The midline structures are normal. There is confluent hyperintense T2-weighted signal within the periventricular, deep and subcortical white matter, most often seen in the setting of chronic microvascular ischemia. No mass lesion or midline shift. No hydrocephalus or extra-axial fluid collection. Vascular: Major intracranial arterial and venous sinus flow voids are preserved. There are a few scattered punctate foci of susceptibility consistent with chronic microhemorrhage, in a nonspecific distribution. Skull and upper cervical spine: The visualized skull base, calvarium, upper cervical spine and extracranial soft tissues are normal. Sinuses/Orbits: No fluid levels or advanced mucosal thickening. No mastoid effusion. Normal orbits. MRA HEAD FINDINGS Intracranial internal carotid arteries: Normal. Anterior  cerebral arteries: Normal. Middle cerebral arteries: There is an aneurysm at the right MCA bifurcation, which measures 2 mm at its base and 4 mm from base to apex. The middle cerebral arteries are otherwise normal without high-grade stenosis or occlusion. Posterior communicating arteries: Absent bilaterally. Posterior cerebral arteries: Normal. Basilar artery: There is diminished flow related enhancement within the basilar artery proximally. Vertebral arteries: Left dominant. The right vertebral artery terminates in PICA. Superior cerebellar arteries: Normal. Anterior inferior cerebellar arteries: Not visualized, which is not uncommon. Posterior inferior cerebellar arteries: Normal. IMPRESSION: 1. Multiple small acute infarcts, located within the left pons, superior left thalamus and right occipital lobe. No acute hemorrhage or mass effect. 2. No intracranial arterial occlusion. Diminished flow related enhancement within the basilar artery may indicate a degree of stenosis. This could be further characterized with CTA if clinically indicated. 3. Right MCA bifurcation aneurysm,  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 4. Advanced chronic microvascular ischemia. Electronically Signed   By: Ulyses Jarred M.D.   On: 12/08/2015 05:50   [name] Head/brain CV Cm  Result Date: 12/08/2015 CLINICAL DATA:  Altered mental status.  Suspected TIA. EXAM: MRI HEAD WITHOUT CONTRAST MRA HEAD WITHOUT CONTRAST TECHNIQUE: Multiplanar, multiecho pulse sequences of the brain and surrounding structures were obtained without intravenous contrast. Angiographic images of the head were obtained using MRA technique without contrast. COMPARISON:  Head CT 12/07/2015 FINDINGS: MRI HEAD FINDINGS Brain: There are multiple focal areas of diffusion restriction, located within the left pons, right occipital lobe and superior left thalamus. The midline structures are normal. There is confluent hyperintense T2-weighted signal within the  periventricular, deep and subcortical white matter, most often seen in the setting of chronic microvascular ischemia. No mass lesion or midline shift. No hydrocephalus or extra-axial fluid collection. Vascular: Major intracranial arterial and venous sinus flow voids are preserved. There are a few scattered punctate foci of susceptibility consistent with chronic microhemorrhage, in a nonspecific distribution. Skull and upper cervical spine: The visualized skull base, calvarium, upper cervical spine and extracranial soft tissues are normal. Sinuses/Orbits: No fluid levels or advanced mucosal thickening. No mastoid effusion. Normal orbits. MRA HEAD FINDINGS Intracranial internal carotid arteries: Normal. Anterior cerebral arteries: Normal. Middle cerebral arteries: There is an aneurysm at the right MCA bifurcation, which measures 2 mm at its base and 4 mm from base to apex. The middle cerebral arteries are otherwise normal without high-grade stenosis or occlusion. Posterior communicating arteries: Absent bilaterally. Posterior cerebral arteries: Normal. Basilar artery: There is diminished flow related enhancement within the basilar artery proximally. Vertebral arteries: Left dominant. The right vertebral artery terminates in PICA. Superior cerebellar arteries: Normal. Anterior inferior cerebellar arteries: Not visualized, which is not uncommon. Posterior inferior cerebellar arteries: Normal. IMPRESSION: 1. Multiple small acute infarcts, located within the left pons, superior left thalamus and right occipital lobe. No acute hemorrhage or mass effect. 2. No intracranial arterial occlusion. Diminished flow related enhancement within the basilar artery may indicate a degree of stenosis. This could be further characterized with CTA if clinically indicated. 3. 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 4. Advanced chronic microvascular ischemia. Electronically Signed   By: Ulyses Jarred M.D.   On: 12/08/2015 05:50    Microbiology: No results found for this or any previous visit (from the past 240 hour(s)).   Labs: Basic Metabolic Panel:  Recent Labs Lab 12/07/15 0456  NA 134*  K 3.7  CL 100*  CO2 27  GLUCOSE 107*  BUN 10  CREATININE 0.90  CALCIUM 9.3   Liver Function Tests:  Recent Labs Lab 12/07/15 0456  AST 23  ALT 24  ALKPHOS 59  BILITOT 0.8  PROT 7.3  ALBUMIN 4.3   No results for input(s): LIPASE, AMYLASE in the last 168 hours. No results for input(s): AMMONIA in the last 168 hours. CBC:  Recent Labs Lab 12/07/15 0456  WBC 6.1  NEUTROABS 4.1  HGB 14.4  HCT 41.6  MCV 92.2  PLT 221   Cardiac Enzymes:  Recent Labs Lab 12/07/15 0456  TROPONINI <0.03   BNP: BNP (last 3 results) No results for input(s): BNP in the last 8760 hours.  ProBNP (last 3 results) No results for input(s): PROBNP in the last 8760 hours.  CBG:  Recent Labs Lab 12/07/15 1654 12/07/15 2120  12/08/15 0615 12/08/15 1135 12/08/15 1715  GLUCAP 81 86 97 96 117*

## 2015-12-11 NOTE — Progress Notes (Signed)
Notified by telemetry of patient's 2.04 second pause. Notified MD. Pt recently diagnosed with a-fib. Pt is asymptomatic. Will continue to monitor. Wendee Copp

## 2015-12-11 NOTE — Discharge Instructions (Signed)
Rivaroxaban oral tablets What is this medicine? RIVAROXABAN (ri va ROX a ban) is an anticoagulant (blood thinner). It is used to treat blood clots in the lungs or in the veins. It is also used after knee or hip surgeries to prevent blood clots. It is also used to lower the chance of stroke in people with a medical condition called atrial fibrillation. This medicine may be used for other purposes; ask your health care provider or pharmacist if you have questions. What should I tell my health care provider before I take this medicine? They need to know if you have any of these conditions: -bleeding disorders -bleeding in the brain -blood in your stools (black or tarry stools) or if you have blood in your vomit -history of stomach bleeding -kidney disease -liver disease -low blood counts, like low white cell, platelet, or red cell counts -recent or planned spinal or epidural procedure -take medicines that treat or prevent blood clots -an unusual or allergic reaction to rivaroxaban, other medicines, foods, dyes, or preservatives -pregnant or trying to get pregnant -breast-feeding How should I use this medicine? Take this medicine by mouth with a glass of water. Follow the directions on the prescription label. Take your medicine at regular intervals. Do not take it more often than directed. Do not stop taking except on your doctor's advice. Stopping this medicine may increase your risk of a blood clot. Be sure to refill your prescription before you run out of medicine. If you are taking this medicine after hip or knee replacement surgery, take it with or without food. If you are taking this medicine for atrial fibrillation, take it with your evening meal. If you are taking this medicine to treat blood clots, take it with food at the same time each day. If you are unable to swallow your tablet, you may crush the tablet and mix it in applesauce. Then, immediately eat the applesauce. You should eat more  food right after you eat the applesauce containing the crushed tablet. Talk to your pediatrician regarding the use of this medicine in children. Special care may be needed. Overdosage: If you think you have taken too much of this medicine contact a poison control center or emergency room at once. NOTE: This medicine is only for you. Do not share this medicine with others. What if I miss a dose? If you take your medicine once a day and miss a dose, take the missed dose as soon as you remember. If you take your medicine twice a day and miss a dose, take the missed dose immediately. In this instance, 2 tablets may be taken at the same time. The next day you should take 1 tablet twice a day as directed. What may interact with this medicine? -aspirin and aspirin-like medicines -certain antibiotics like erythromycin, azithromycin, and clarithromycin -certain medicines for fungal infections like ketoconazole and itraconazole -certain medicines for irregular heart beat like amiodarone, quinidine, dronedarone -certain medicines for seizures like carbamazepine, phenytoin -certain medicines that treat or prevent blood clots like warfarin, enoxaparin, and dalteparin -conivaptan -diltiazem -felodipine -indinavir -lopinavir; ritonavir -NSAIDS, medicines for pain and inflammation, like ibuprofen or naproxen -ranolazine -rifampin -ritonavir -St. John's wort -verapamil This list may not describe all possible interactions. Give your health care provider a list of all the medicines, herbs, non-prescription drugs, or dietary supplements you use. Also tell them if you smoke, drink alcohol, or use illegal drugs. Some items may interact with your medicine. What should I watch for while using this   medicine? Visit your doctor or health care professional for regular checks on your progress. Your condition will be monitored carefully while you are receiving this medicine. Notify your doctor or health care  professional and seek emergency treatment if you develop breathing problems; changes in vision; chest pain; severe, sudden headache; pain, swelling, warmth in the leg; trouble speaking; sudden numbness or weakness of the face, arm, or leg. These can be signs that your condition has gotten worse. If you are going to have surgery, tell your doctor or health care professional that you are taking this medicine. Tell your health care professional that you use this medicine before you have a spinal or epidural procedure. Sometimes people who take this medicine have bleeding problems around the spine when they have a spinal or epidural procedure. This bleeding is very rare. If you have a spinal or epidural procedure while on this medicine, call your health care professional immediately if you have back pain, numbness or tingling (especially in your legs and feet), muscle weakness, paralysis, or loss of bladder or bowel control. Avoid sports and activities that might cause injury while you are using this medicine. Severe falls or injuries can cause unseen bleeding. Be careful when using sharp tools or knives. Consider using an electric razor. Take special care brushing or flossing your teeth. Report any injuries, bruising, or red spots on the skin to your doctor or health care professional. What side effects may I notice from receiving this medicine? Side effects that you should report to your doctor or health care professional as soon as possible: -allergic reactions like skin rash, itching or hives, swelling of the face, lips, or tongue -back pain -redness, blistering, peeling or loosening of the skin, including inside the mouth -signs and symptoms of bleeding such as bloody or black, tarry stools; red or dark-brown urine; spitting up blood or brown material that looks like coffee grounds; red spots on the skin; unusual bruising or bleeding from the eye, gums, or nose Side effects that usually do not require  medical attention (Report these to your doctor or health care professional if they continue or are bothersome.): -dizziness -muscle pain This list may not describe all possible side effects. Call your doctor for medical advice about side effects. You may report side effects to FDA at 1-800-FDA-1088. Where should I keep my medicine? Keep out of the reach of children. Store at room temperature between 15 and 30 degrees C (59 and 86 degrees F). Throw away any unused medicine after the expiration date. NOTE: This sheet is a summary. It may not cover all possible information. If you have questions about this medicine, talk to your doctor, pharmacist, or health care provider.    2016, [employer]vier/[employer] Standard. (2014-02-14 12:45:34)  

## 2016-01-01 DIAGNOSIS — F411 Generalized anxiety disorder: Secondary | ICD-10-CM | POA: Diagnosis not present

## 2016-01-01 DIAGNOSIS — K219 Gastro-esophageal reflux disease without esophagitis: Secondary | ICD-10-CM | POA: Diagnosis not present

## 2016-01-01 DIAGNOSIS — I480 Paroxysmal atrial fibrillation: Secondary | ICD-10-CM | POA: Diagnosis not present

## 2016-01-01 DIAGNOSIS — I651 Occlusion and stenosis of basilar artery: Secondary | ICD-10-CM | POA: Diagnosis not present

## 2016-01-01 DIAGNOSIS — E782 Mixed hyperlipidemia: Secondary | ICD-10-CM | POA: Diagnosis not present

## 2016-01-01 DIAGNOSIS — I10 Essential (primary) hypertension: Secondary | ICD-10-CM | POA: Diagnosis not present

## 2016-01-01 DIAGNOSIS — R001 Bradycardia, unspecified: Secondary | ICD-10-CM | POA: Diagnosis not present

## 2016-01-01 DIAGNOSIS — R7301 Impaired fasting glucose: Secondary | ICD-10-CM | POA: Diagnosis not present

## 2016-01-01 DIAGNOSIS — I2510 Atherosclerotic heart disease of native coronary artery without angina pectoris: Secondary | ICD-10-CM | POA: Diagnosis not present

## 2016-01-01 DIAGNOSIS — Z6827 Body mass index (BMI) 27.0-27.9, adult: Secondary | ICD-10-CM | POA: Diagnosis not present

## 2016-01-01 DIAGNOSIS — I6522 Occlusion and stenosis of left carotid artery: Secondary | ICD-10-CM | POA: Diagnosis not present

## 2016-01-06 NOTE — Progress Notes (Signed)
Cardiology Office Note  Date: 01/07/2016   ID: William Elliott, DOB 01-Nov-1940, MRN [id number]  PCP: [name], MD  Primary Cardiologist: [name], MD   Chief Complaint  Patient presents with  . Coronary Artery Disease  . PAF    History of Present Illness: William Elliott is a 75 y.o. male that I met back in April, a former patient of Dr. Mare Ferrari. He presents today with his wife for a follow-up visit. Interval records reviewed, he was hospitalized in October with confusion, diagnosed with stroke, specifically multiple small acute infarcts within the left pons, superior left thalamus, and right occipital lobe. 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 He was seen by cardiology with finding of paroxysmal atrial fibrillation as well and started on Xarelto in addition to Plavix. Follow-up echocardiogram is noted below.  He states that he feels better in general, still somewhat weak. He does not report any palpitations or chest pain. Also no bleeding problems.  I reviewed his cardiac medications. He is on Plavix, Xarelto, Norvasc, Zebeta, Avandia, HCTZ, Cozaar, Imdur, Lovaza, KCl and as needed nitroglycerin.  I reviewed the lab work from October.  Past Medical History:  Diagnosis Date  . Acute lower GI bleeding 2008  . Anxiety   . Asthma   . Carotid artery disease ([place])    Asymptomatic left carotid bruit  . COPD (chronic obstructive pulmonary disease) ([place])   . Coronary artery disease    a. CABG - 1999 with LIMA-LAD, SVG-DIAG, SVG-OM, SVG-RPDA  b. Cath in setting of NSTEMI 03/18/2015: patent LIMA-LAD, occluded SVG-RCA, occluded SVG-D1, 99% stenosis of SVG-OM treated w/ DES  . Depression   . Dyslipidemia   . Erectile dysfunction   . Essential hypertension   . History of blood transfusion 2008  . History of stroke   . Hyperlipidemia   . Myocardial infarction     Past Surgical History:  Procedure Laterality Date  . CARDIAC CATHETERIZATION    .  CARDIAC CATHETERIZATION N/A 03/18/2015   Procedure: Left Heart Cath and Cors/Grafts Angiography;  Surgeon: Leonie Man, MD;  Location: [facility];  Service: Cardiovascular;  Laterality: N/A;  . CARDIAC CATHETERIZATION N/A 03/18/2015   Procedure: Coronary Stent Intervention;  Surgeon: Leonie Man, MD;  Location: [facility];  Service: Cardiovascular;  Laterality: N/A;  . CATARACT EXTRACTION W/ INTRAOCULAR LENS  IMPLANT, BILATERAL Bilateral   . CORONARY ANGIOPLASTY    . CORONARY ARTERY BYPASS GRAFT  1999  . FRACTURE SURGERY    . HERNIA REPAIR    . [facility]; took bone out of my right hip  . TONSILLECTOMY    . UMBILICAL HERNIA REPAIR  ~ 2000    Current Outpatient Prescriptions  Medication Sig Dispense Refill  . Albuterol Sulfate 108 (90 BASE) MCG/ACT AEPB Inhale 2 puffs into the lungs 4 (four) times daily as needed (wheezing and sob).    . ALPRAZolam (XANAX) 1 MG tablet TAKE 1 TABLET BY MOUTH FOUR TIMES DAILY 120 tablet 2  . amLODipine (NORVASC) 5 MG tablet Take 1 tablet (5 mg total) by mouth daily. 30 tablet 6  . b complex vitamins tablet Take 1 tablet by mouth daily.    . bisoprolol (ZEBETA) 5 MG tablet Take 5 mg by mouth daily. Takes 1/2 tablet daily    . clopidogrel (PLAVIX) 75 MG tablet Take 1 tablet (75 mg total) by mouth daily with breakfast.  30 tablet 6  . ezetimibe (ZETIA) 10 MG tablet Take 1 tablet (10 mg total) by mouth daily. 90 tablet 3  . fluticasone (FLONASE) 50 MCG/ACT nasal spray USE 2 SPRAYS INTO EACH NOSTRIL DAILY. 16 g 2  . hydrochlorothiazide (HYDRODIURIL) 25 MG tablet Take 1 tablet (25 mg total) by mouth daily. 90 tablet 3  . isosorbide mononitrate (IMDUR) 30 MG 24 hr tablet Take 1 tablet (30 mg total) by mouth daily. 30 tablet 11  . losartan (COZAAR) 100 MG tablet Take 0.5 tablets (50 mg total) by mouth daily. 45 tablet 2  . multivitamin (THERAGRAN) per tablet Take 1 tablet by mouth daily.      .  nitroGLYCERIN (NITROSTAT) 0.4 MG SL tablet Place 1 tablet (0.4 mg total) under the tongue every 5 (five) minutes as needed for chest pain. 25 tablet 3  . omega-3 acid ethyl esters (LOVAZA) 1 g capsule TAKE 2 CAPSULES BY MOUTH TWICE DAILY AS NEEDED 120 capsule 3  . pantoprazole (PROTONIX) 40 MG tablet Take 1 tablet (40 mg total) by mouth daily. 30 tablet 6  . potassium chloride SA (K-DUR,KLOR-CON) 20 MEQ tablet Take 1 tablet (20 mEq total) by mouth 2 (two) times daily. 180 tablet 3  . rivaroxaban (XARELTO) 20 MG TABS tablet Take 1 tablet (20 mg total) by mouth daily with supper. 30 tablet 0   No current facility-administered medications for this visit.    Allergies:  Anoro ellipta [umeclidinium-vilanterol]; Stiolto respimat [tiotropium bromide-olodaterol]; Fenofibrate; Lipitor [atorvastatin calcium]; Niaspan [niacin]; and Zocor [simvastatin]   Social History: The patient  reports that he quit smoking about 18 years ago. His smoking use included Cigars. He quit after 10.00 years of use. He has never used smokeless tobacco. He reports that he does not drink alcohol or use drugs.   ROS:  Please see the history of present illness. Otherwise, complete review of systems is positive for trouble with memory.  All other systems are reviewed and negative.   Physical Exam: VS:  BP 126/70   Pulse (!) 54   Ht _0  (1.778 m)   Wt 186 lb (84.4 kg)   SpO2 98%   BMI 26.69 kg/m , BMI Body mass index is 26.69 kg/m.  Wt Readings from Last 3 Encounters:  01/07/16 186 lb (84.4 kg)  12/07/15 190 lb (86.2 kg)  06/10/15 201 lb 3.2 oz (91.3 kg)    General: Elderly male, patient appears comfortable at rest. HEENT: Conjunctiva and lids normal, oropharynx clear. Neck: Supple, no elevated JVP or carotid bruits, no thyromegaly. Lungs: Clear to auscultation, nonlabored breathing at rest. Cardiac: Regular rate and rhythm, no S3, soft systolic murmur, no pericardial rub. Abdomen: Soft, nontender, bowel sounds  present, no guarding or rebound. Extremities: Trace leg edema, distal pulses 2+. Skin: Warm and dry. Musculoskeletal: No kyphosis. Neuropsychiatric: Alert and oriented x3, affect grossly appropriate.  ECG: I personally reviewed the tracing from 12/07/2015 which showed sinus rhythm with prolonged PR interval.  Recent Labwork: 12/07/2015: ALT 24; AST 23; BUN 10; Creatinine, Ser 0.90; Hemoglobin 14.4; Platelets 221; Potassium 3.7; Sodium 134     Component Value Date/Time   CHOL 176 12/09/2015 0616   TRIG 157 (H) 12/09/2015 0616   HDL 30 (L) 12/09/2015 0616   CHOLHDL 5.9 12/09/2015 0616   VLDL 31 12/09/2015 0616   LDLCALC 115 (H) 12/09/2015 0616   LDLDIRECT 117.0 04/20/2014 1303   Other Studies Reviewed Today:  Cardiac catheterization 03/18/2015:    Conclusion    1. Prox  LAD to Mid LAD lesion, 95% stenosed. Mid LAD lesion, 100% stenosed. Ost 2nd Diag to 2nd Diag lesion, 80% stenosed. 2. Ost Cx to Prox Cx lesion, 100% stenosed. 3. Mid RCA to Dist RCA lesion, 100% stenosed. Post Atrio lesion, 100% stenosed. 4. LIMA was injected is large. JR4 Catheter used 5. SVG-RCA & SVG-DIAG 100% occluded 6. Prox Graft lesion SVG-OM, 99% stenosed. Very large - TIMI 1 flow to graft insertion, but no flow in the OM. 7. Following PCI of SVG-OM with distal protection device using Synergy DES, Post intervention, there is a 0% residual stenosis. 8. Mid Cx to Dist Cx lesion, 65% stenosed - not identified until post PCI   Overall severe native coronary artery disease with occluded native mid LAD, proximal circumflex and mid RCA. Patent LIMA-LAD, but 2 of 3 vein grafts 100% occluded (SVG-RCA and SVG-diagonal). - 99% proximal stenosis of SVG-OM treated with DES stent. Initially there was concern basilar patient having history of Mallory-Weiss tear with severe GI bleed 8 years ago. I confirmed with Dr. Mare Ferrari, this should not be an issue being so far distant to that event. The plan is to use Plavix, but  based on the length of the lesion, I felt it best to be using a drug-eluting stent Relatively difficult procedure due to the extent of thrombus and debris in the vessel with TIMI 1 flow pre-PCI.  Overall successful PCI of the SVG-OM1 and TIMI-3 flow.  Plan:  Monitor overnight in the postprocedure unit on 6 [facility].  Sheath removal with manual pressure for hemostasis  I would continue Plavix for minimum of 6 months, preferably one year. Would likely be fine stopping Plavix after 3 months.  Continue other cardiac treatment. He has a severely elevated LVEDP and may require diuresis prior to discharge.   Echocardiogram 12/09/2015: Study Conclusions  - Left ventricle: The cavity size was normal. There was severe   concentric hypertrophy. Systolic function was vigorous. The   estimated ejection fraction was in the range of 65% to 70%. Wall   motion was normal; there were no regional wall motion   abnormalities. The study was not technically sufficient to allow   evaluation of LV diastolic dysfunction due to atrial   fibrillation. - Aortic valve: Trileaflet; mildly thickened, mildly calcified   leaflets. Transvalvular velocity was minimally increased. There   was no stenosis. There was mild regurgitation. - Ascending aorta: The ascending aorta was normal in size. - Mitral valve: Mildly thickened leaflets . There was mild   regurgitation. - Left atrium: The atrium was mildly dilated. - Right ventricle: Systolic function was normal. - Right atrium: The atrium was normal in size. - Tricuspid valve: Structurally normal valve. There was trivial   regurgitation. - Pulmonic valve: Structurally normal valve. - Inferior vena cava: The vessel was normal in size. - Pericardium, extracardiac: There was no pericardial effusion.  Assessment and Plan:  1. Recent diagnosis of paroxysmal atrial fibrillation in the setting of stroke. He is now on Xarelto for stroke prophylaxis. Heart rate regular  today and he does not report palpitations. Also on Zebeta. We will plan a follow-up visit with CBC and BMET in about 3 months.  2. Multivessel CAD status post CABG in 1999. He has recently documented graft disease with an NSTEMI in January of this year. At that time there was occlusion of the SVG to RCA as well as SVG to diagonal. Severe stenosis within the SVG to obtuse marginal was treated with DES. He remains on  Plavix, but aspirin was stopped in light of recent institution of Xarelto. No active angina symptoms.  3. Hyperlipidemia with statin and niacin intolerance. He is on Zetia and Lovaza.  4. Essential hypertension, blood pressure is well controlled today.  5. Remote history of GI bleed. No recent recurrences.  Current medicines were reviewed with the patient today.   Orders Placed This Encounter  Procedures  . CBC  . Basic metabolic panel    Disposition: Follow-up in 3 months.  Signed, Satira Sark, MD, [facility] 01/07/2016 4:01 PM    [facility] [place], [place], [place] [address] Phone: [phone]; Fax: [phone]

## 2016-01-07 ENCOUNTER — Ambulatory Visit (INDEPENDENT_AMBULATORY_CARE_PROVIDER_SITE_OTHER): Payer: Medicare Other | Admitting: Cardiology

## 2016-01-07 ENCOUNTER — Encounter: Payer: Self-pay | Admitting: Cardiology

## 2016-01-07 VITALS — BP 126/70 | HR 54 | Ht 70.0 in | Wt 186.0 lb

## 2016-01-07 DIAGNOSIS — I6330 Cerebral infarction due to thrombosis of unspecified cerebral artery: Secondary | ICD-10-CM

## 2016-01-07 DIAGNOSIS — Z789 Other specified health status: Secondary | ICD-10-CM

## 2016-01-07 DIAGNOSIS — I10 Essential (primary) hypertension: Secondary | ICD-10-CM | POA: Diagnosis not present

## 2016-01-07 DIAGNOSIS — I25810 Atherosclerosis of coronary artery bypass graft(s) without angina pectoris: Secondary | ICD-10-CM | POA: Diagnosis not present

## 2016-01-07 DIAGNOSIS — Z7901 Long term (current) use of anticoagulants: Secondary | ICD-10-CM

## 2016-01-07 DIAGNOSIS — E782 Mixed hyperlipidemia: Secondary | ICD-10-CM

## 2016-01-07 DIAGNOSIS — I480 Paroxysmal atrial fibrillation: Secondary | ICD-10-CM | POA: Diagnosis not present

## 2016-01-07 NOTE — Patient Instructions (Signed)
Medication Instructions:  Continue all current medications.  Labwork:  BMET, CBC - orders given today.   Due just prior to next office visit  Testing/Procedures: none  Follow-Up: 3 months   Any Other Special Instructions Will Be Listed Below (If Applicable).  If you need a refill on your cardiac medications before your next appointment, please call your pharmacy.

## 2016-01-08 MED ORDER — RIVAROXABAN 20 MG PO TABS
20.0000 mg | ORAL_TABLET | Freq: Every day | ORAL | 0 refills | Status: DC
Start: 2016-01-08 — End: 2016-03-17

## 2016-01-08 NOTE — Addendum Note (Signed)
Addended by: Laurine Blazer on: 01/08/2016 03:18 PM   Modules accepted: Orders

## 2016-01-19 ENCOUNTER — Other Ambulatory Visit: Payer: Self-pay | Admitting: Cardiology

## 2016-01-20 ENCOUNTER — Ambulatory Visit: Payer: Self-pay | Admitting: Nurse Practitioner

## 2016-02-10 ENCOUNTER — Encounter: Payer: Self-pay | Admitting: *Deleted

## 2016-02-10 ENCOUNTER — Ambulatory Visit (INDEPENDENT_AMBULATORY_CARE_PROVIDER_SITE_OTHER): Payer: Medicare Other | Admitting: Nurse Practitioner

## 2016-02-10 ENCOUNTER — Encounter: Payer: Self-pay | Admitting: Nurse Practitioner

## 2016-02-10 VITALS — BP 133/67 | HR 44 | Ht 70.0 in | Wt 192.4 lb

## 2016-02-10 DIAGNOSIS — I10 Essential (primary) hypertension: Secondary | ICD-10-CM

## 2016-02-10 DIAGNOSIS — E785 Hyperlipidemia, unspecified: Secondary | ICD-10-CM | POA: Diagnosis not present

## 2016-02-10 DIAGNOSIS — I6330 Cerebral infarction due to thrombosis of unspecified cerebral artery: Secondary | ICD-10-CM

## 2016-02-10 DIAGNOSIS — I729 Aneurysm of unspecified site: Secondary | ICD-10-CM

## 2016-02-10 NOTE — Progress Notes (Signed)
[facility]  PATIENT: William Elliott DOB: 12/19/1940   REASON FOR VISIT: Hospital follow-up for stroke HISTORY FROM: Patient and wife    HISTORY OF PRESENT ILLNESS:William E Hatcheris a 75 y.o.malewho was in his normal state of health until yesterday evening. After he had taken the dog outside, he came back in and began asking where his sons were, which did not make sense in this context. His wife states that he repeated the same question over and over. He also had used his nebulizer, but then went to use it again even though he had ordered previously used it apparently not remembering that he had previously used it. This behavior continued for 30-45 minutes and then gradually improved. He seemed relatively lucid other than the fact that he was asking the same questions over and over. MR of the brain multiple small acute infarcts located within the left pons. Left thalamus and right occipital lobe. MRA diminished flow basilar artery. Carotid Doppler 1-39% ICA stenosis left greater than right 2-D echo EF 65%. LDL 1:15 hemoglobin A1c 5.5. He was placed on Xarelto with Plavix by cardiology. He returns to stroke clinic today for follow-up. He remains on Xarelto and  Plavix no bruising or bleeding. He remains on Zetia without side effects. Blood pressure in the office today 133/67. He was encouraged to exercise. 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 He returns for reevaluation  REVIEW OF SYSTEMS: Full 14 system review of systems performed and notable only for those listed, all others are neg:  Constitutional: neg  Cardiovascular: neg Ear/Nose/Throat: neg  Skin: neg Eyes: neg Respiratory: neg Gastroitestinal: neg  Hematology/Lymphatic: neg  Endocrine: neg Musculoskeletal:neg Allergy/Immunology: neg Neurological: neg Psychiatric: Anxiety  Sleep :  neg   ALLERGIES: Allergies  Allergen Reactions  . Anoro Ellipta [Umeclidinium-Vilanterol] Other (See Comments)    Cardiac problems  . Stiolto Respimat [Tiotropium Bromide-Olodaterol] Other (See Comments)    Cardiac problems  . Fenofibrate Other (See Comments)    No energy  . Lipitor [Atorvastatin Calcium] Other (See Comments)    Not known   . Niaspan [Niacin] Other (See Comments)    Not known  . Zocor [Simvastatin] Other (See Comments)    Not known    HOME MEDICATIONS: Outpatient Medications Prior to Visit  Medication Sig Dispense Refill  . Albuterol Sulfate 108 (90 BASE) MCG/ACT AEPB Inhale 2 puffs into the lungs 4 (four) times daily as needed (wheezing and sob).    . ALPRAZolam (XANAX) 1 MG tablet TAKE 1 TABLET BY MOUTH FOUR TIMES DAILY 120 tablet 2  . amLODipine (NORVASC) 5 MG tablet Take 1 tablet (5 mg total) by mouth daily. 30 tablet 6  . b complex vitamins tablet Take 1 tablet by mouth daily.    . bisoprolol (ZEBETA) 5 MG tablet Take 5 mg by mouth daily. Takes 1/2 tablet daily    . clopidogrel (PLAVIX) 75 MG tablet Take 1 tablet (75 mg total) by mouth daily with breakfast. 30 tablet 6  . ezetimibe (ZETIA) 10 MG tablet Take 1 tablet (10 mg total) by mouth daily. 90 tablet 3  . fluticasone (FLONASE) 50 MCG/ACT nasal spray USE 2 SPRAYS INTO EACH NOSTRIL DAILY. 16 g 2  . hydrochlorothiazide (HYDRODIURIL) 25 MG tablet Take 1 tablet (25 mg total) by mouth daily. 90 tablet 3  . isosorbide mononitrate (IMDUR) 30 MG 24 hr tablet  Take 1 tablet (30 mg total) by mouth daily. 30 tablet 11  . losartan (COZAAR) 100 MG tablet Take 0.5 tablets (50 mg total) by mouth daily. 45 tablet 2  . multivitamin (THERAGRAN) per tablet Take 1 tablet by mouth daily.      . nitroGLYCERIN (NITROSTAT) 0.4 MG SL tablet Place 1 tablet (0.4 mg total) under the tongue every 5 (five) minutes as needed for chest pain. 25 tablet 3  . omega-3 acid ethyl esters (LOVAZA) 1 g capsule TAKE 2 TABLETS BY MOUTH TWICE  DAILY AS NEEDED 120 capsule 0  . pantoprazole (PROTONIX) 40 MG tablet Take 1 tablet (40 mg total) by mouth daily. 30 tablet 6  . potassium chloride SA (K-DUR,KLOR-CON) 20 MEQ tablet Take 1 tablet (20 mEq total) by mouth 2 (two) times daily. 180 tablet 3  . rivaroxaban (XARELTO) 20 MG TABS tablet Take 1 tablet (20 mg total) by mouth daily with supper. 4 tablet 0   No facility-administered medications prior to visit.     PAST MEDICAL HISTORY: Past Medical History:  Diagnosis Date  . Acute lower GI bleeding 2008  . Anxiety   . Asthma   . Carotid artery disease ([place])    Asymptomatic left carotid bruit  . COPD (chronic obstructive pulmonary disease) ([place])   . Coronary artery disease    a. CABG - 1999 with LIMA-LAD, SVG-DIAG, SVG-OM, SVG-RPDA  b. Cath in setting of NSTEMI 03/18/2015: patent LIMA-LAD, occluded SVG-RCA, occluded SVG-D1, 99% stenosis of SVG-OM treated w/ DES  . Depression   . Dyslipidemia   . Erectile dysfunction   . Essential hypertension   . History of blood transfusion 2008  . History of stroke   . Hyperlipidemia   . Myocardial infarction     PAST SURGICAL HISTORY: Past Surgical History:  Procedure Laterality Date  . CARDIAC CATHETERIZATION    . CARDIAC CATHETERIZATION N/A 03/18/2015   Procedure: Left Heart Cath and Cors/Grafts Angiography;  Surgeon: Leonie Man, MD;  Location: [facility];  Service: Cardiovascular;  Laterality: N/A;  . CARDIAC CATHETERIZATION N/A 03/18/2015   Procedure: Coronary Stent Intervention;  Surgeon: Leonie Man, MD;  Location: [facility];  Service: Cardiovascular;  Laterality: N/A;  . CATARACT EXTRACTION W/ INTRAOCULAR LENS  IMPLANT, BILATERAL Bilateral   . CORONARY ANGIOPLASTY    . CORONARY ARTERY BYPASS GRAFT  1999  . FRACTURE SURGERY    . HERNIA REPAIR    . [facility]; took bone out of my right hip  . TONSILLECTOMY    . UMBILICAL HERNIA REPAIR  ~ 2000    FAMILY  HISTORY: Family History  Problem Relation Age of Onset  . Heart disease Father   . Heart attack Father   . Hypertension Mother   . Stroke Mother     SOCIAL HISTORY: Social History   Social History  . Marital status: Married    Spouse name: N/A  . Number of children: N/A  . Years of education: N/A   Occupational History  . Welder x 10 yrs     Social History Main Topics  . Smoking status: Former Smoker    Years: 10.00    Types: Cigars    Quit date: 03/02/1997  . Smokeless tobacco: Never Used     Comment: smoked cigars x 10 yrs, never smoked cigarettes  . Alcohol use No     Comment: "drank some when I was young; quit in the  1990s"  . Drug use: No  . Sexual activity: Not Currently   Other Topics Concern  . Not on file   Social History Narrative  . No narrative on file     PHYSICAL EXAM  Vitals:   02/10/16 1523  BP: 133/67  Pulse: (!) 44  Weight: 192 lb 6.4 oz (87.3 kg)  Height: 5\' 10"  (1.778 m)   Body mass index is 27.61 kg/m.  Generalized: Well developed, in no acute distress  Head: normocephalic and atraumatic,. Oropharynx benign  Neck: Supple, no carotid bruits  Cardiac: Regular rate rhythm, no murmur  Musculoskeletal: No deformity   Neurological examination   Mentation: Alert oriented to time, place, history taking. Attention span and concentration appropriate. Recent and remote memory intact.  Follows all commands speech and language fluent.   Cranial nerve II-XII: Fundoscopic exam reveals sharp disc margins.Pupils were equal round reactive to light extraocular movements were full, visual field were full on confrontational test. Facial sensation and strength were normal. hearing was intact to finger rubbing bilaterally. Uvula tongue midline. head turning and shoulder shrug were normal and symmetric.Tongue protrusion into cheek strength was normal. Motor: normal bulk and tone, full strength in the BUE, BLE, fine finger movements normal, no pronator drift.  No focal weakness Sensory: normal and symmetric to light touch, pinprick, and  Vibration, in the upper and lower extremities Coordination: finger-nose-finger, heel-to-shin bilaterally, no dysmetria Reflexes: 1+ upper lower and symmetric plantar responses were flexor bilaterally. Gait and Station: Rising up from seated position without assistance, normal stance,  moderate stride, good arm swing, smooth turning, able to perform tiptoe, and heel walking without difficulty. Tandem gait is steady no assistive device  DIAGNOSTIC DATA (LABS, IMAGING, TESTING) - I reviewed patient records, labs, notes, testing and imaging myself where available.  Lab Results  Component Value Date   WBC 6.1 12/07/2015   HGB 14.4 12/07/2015   HCT 41.6 12/07/2015   MCV 92.2 12/07/2015   PLT 221 12/07/2015      Component Value Date/Time   NA 134 (L) 12/07/2015 0456   K 3.7 12/07/2015 0456   CL 100 (L) 12/07/2015 0456   CO2 27 12/07/2015 0456   GLUCOSE 107 (H) 12/07/2015 0456   BUN 10 12/07/2015 0456   CREATININE 0.90 12/07/2015 0456   CREATININE 0.98 03/13/2015 1631   CALCIUM 9.3 12/07/2015 0456   PROT 7.3 12/07/2015 0456   ALBUMIN 4.3 12/07/2015 0456   AST 23 12/07/2015 0456   ALT 24 12/07/2015 0456   ALKPHOS 59 12/07/2015 0456   BILITOT 0.8 12/07/2015 0456   GFRNONAA >60 12/07/2015 0456   GFRAA >60 12/07/2015 0456   Lab Results  Component Value Date   CHOL 176 12/09/2015   HDL 30 (L) 12/09/2015   LDLCALC 115 (H) 12/09/2015   LDLDIRECT 117.0 04/20/2014   TRIG 157 (H) 12/09/2015   CHOLHDL 5.9 12/09/2015   Lab Results  Component Value Date   HGBA1C 5.5 12/09/2015    ASSESSMENT AND PLAN  75 y.o. year old male  has a past medical history of Acute lower GI bleeding (2008); Anxiety; Asthma; Carotid artery disease ([place]); COPD (chronic obstructive pulmonary disease) ([place]); Coronary artery disease; Depression; Dyslipidemia; Erectile dysfunction; Essential hypertension; History of blood transfusion  (2008); History of stroke; Hyperlipidemia; and Myocardial infarction. here to follow-up for recent hospitalization for stroke MR of the brain multiple small acute infarcts located within the left pons. Left thalamus and right occipital lobe.  PLAN: Stressed the importance of management of  risk factors to prevent further stroke Continue Xarelto and Plavix for secondary stroke prevention Maintain strict control of hypertension with blood pressure goal below 130/90, today's reading 133/67 continue antihypertensive medications Control of diabetes with hemoglobin A1c below 6.5 followed by primary care most recent hemoglobin A1c5.5 Cholesterol with LDL cholesterol less than 70, followed by primary care,  most recent115 continue Zetia Exercise by walking, slowly increase , eat healthy diet with whole grains,  fresh fruits and vegetables Discussed risk for recurrent stroke/ TIA and answered additional questions Follow up in 3 months This was a  visit requiring 30 minutes  with extensive review of history, hospital chart, counseling and answering questions Dennie Bible, [facility], [facility], APRN  [facility] [address] [place], [place] [address] [phone]

## 2016-02-10 NOTE — Progress Notes (Signed)
I reviewed above note and agree with the assessment and plan.  Rosalin Hawking, MD PhD Stroke Neurology 02/10/2016 5:18 PM

## 2016-02-10 NOTE — Patient Instructions (Addendum)
Stressed the importance of management of risk factors to prevent further stroke Continue Xarelto and Plavix for secondary stroke prevention Maintain strict control of hypertension with blood pressure goal below 130/90, today's reading 133/67 continue antihypertensive medications Control of diabetes with hemoglobin A1c below 6.5 followed by primary care most recent hemoglobin A1c5.5 Cholesterol with LDL cholesterol less than 70, followed by primary care,  most recent115 continue Zetia Exercise by walking, slowly increase , eat healthy diet with whole grains,  fresh fruits and vegetables Discussed risk for recurrent stroke/ TIA and answered additional questions Follow up in 3 months

## 2016-02-17 ENCOUNTER — Other Ambulatory Visit: Payer: Self-pay | Admitting: Physician Assistant

## 2016-02-17 ENCOUNTER — Other Ambulatory Visit: Payer: Self-pay | Admitting: Cardiology

## 2016-02-17 DIAGNOSIS — I119 Hypertensive heart disease without heart failure: Secondary | ICD-10-CM

## 2016-03-17 ENCOUNTER — Other Ambulatory Visit: Payer: Self-pay | Admitting: Cardiology

## 2016-03-17 MED ORDER — RIVAROXABAN 20 MG PO TABS: 20.0000 mg | ORAL_TABLET | Freq: Every day | ORAL | 0 refills | Status: DC

## 2016-03-17 NOTE — Telephone Encounter (Signed)
Pt will come by office for samples and 30 day free card. Made aware of upcoming appt

## 2016-03-17 NOTE — Telephone Encounter (Signed)
Patient called stating he has 5 days left of Xarelto and would like more samples

## 2016-03-20 ENCOUNTER — Telehealth: Payer: Self-pay | Admitting: Cardiology

## 2016-03-20 MED ORDER — RIVAROXABAN 20 MG PO TABS
20.0000 mg | ORAL_TABLET | Freq: Every day | ORAL | 6 refills | Status: DC
Start: 2016-03-20 — End: 2016-10-04

## 2016-03-20 NOTE — Telephone Encounter (Signed)
Went to pick up medication William Elliott and pharmacist told him to call us.

## 2016-03-20 NOTE — Telephone Encounter (Signed)
$  3.73 for 30 tabs

## 2016-03-20 NOTE — Telephone Encounter (Signed)
Call placed to [facility], looks like patient may have just needed refill.  Verbal refill given.

## 2016-04-08 DIAGNOSIS — Z7901 Long term (current) use of anticoagulants: Secondary | ICD-10-CM | POA: Diagnosis not present

## 2016-04-08 DIAGNOSIS — I480 Paroxysmal atrial fibrillation: Secondary | ICD-10-CM | POA: Diagnosis not present

## 2016-04-10 ENCOUNTER — Ambulatory Visit: Payer: Medicare Other | Admitting: Cardiology

## 2016-04-13 ENCOUNTER — Telehealth: Payer: Self-pay

## 2016-04-13 NOTE — Telephone Encounter (Signed)
Patient notified of lab results & copy sent to pcp.

## 2016-04-13 NOTE — Telephone Encounter (Signed)
-----   Message from Satira Sark, MD sent at 04/09/2016  2:33 PM EST ----- Results reviewed. Hemoglobin and platelet count normal, creatinine also looks good at 1.0. No change in current regimen. A copy of this test should be forwarded to [name], MD.

## 2016-04-15 ENCOUNTER — Other Ambulatory Visit: Payer: Self-pay | Admitting: Cardiology

## 2016-05-08 DIAGNOSIS — J111 Influenza due to unidentified influenza virus with other respiratory manifestations: Secondary | ICD-10-CM | POA: Diagnosis not present

## 2016-05-12 ENCOUNTER — Telehealth: Payer: Self-pay | Admitting: Cardiology

## 2016-05-12 NOTE — Telephone Encounter (Signed)
Okay to stop Plavix for now. We can discuss that his pending office visit this week.

## 2016-05-12 NOTE — Telephone Encounter (Signed)
William Elliott called stating that he has stopped taking Plavix. States that it was causing him to bleed.

## 2016-05-12 NOTE — Progress Notes (Deleted)
Cardiology Office Note  Date: 05/12/2016   ID: William Elliott, DOB 07/28/1940, MRN [id number]  PCP: [name], MD  Primary Cardiologist: [name], MD   No chief complaint on file.   History of Present Illness: William Elliott is a 76 y.o. male last seen in November 2017.  Follow-up lab work from February is outlined below.  Past Medical History:  Diagnosis Date  . Acute lower GI bleeding 2008  . Anxiety   . Asthma   . Carotid artery disease ([place])    Asymptomatic left carotid bruit  . COPD (chronic obstructive pulmonary disease) ([place])   . Coronary artery disease    a. CABG - 1999 with LIMA-LAD, SVG-DIAG, SVG-OM, SVG-RPDA  b. Cath in setting of NSTEMI 03/18/2015: patent LIMA-LAD, occluded SVG-RCA, occluded SVG-D1, 99% stenosis of SVG-OM treated w/ DES  . Depression   . Dyslipidemia   . Erectile dysfunction   . Essential hypertension   . History of blood transfusion 2008  . History of stroke   . Hyperlipidemia   . Myocardial infarction     Past Surgical History:  Procedure Laterality Date  . CARDIAC CATHETERIZATION    . CARDIAC CATHETERIZATION N/A 03/18/2015   Procedure: Left Heart Cath and Cors/Grafts Angiography;  Surgeon: Leonie Man, MD;  Location: [facility];  Service: Cardiovascular;  Laterality: N/A;  . CARDIAC CATHETERIZATION N/A 03/18/2015   Procedure: Coronary Stent Intervention;  Surgeon: Leonie Man, MD;  Location: [facility];  Service: Cardiovascular;  Laterality: N/A;  . CATARACT EXTRACTION W/ INTRAOCULAR LENS  IMPLANT, BILATERAL Bilateral   . CORONARY ANGIOPLASTY    . CORONARY ARTERY BYPASS GRAFT  1999  . FRACTURE SURGERY    . HERNIA REPAIR    . [facility]; took bone out of my right hip  . TONSILLECTOMY    . UMBILICAL HERNIA REPAIR  ~ 2000    Current Outpatient Prescriptions  Medication Sig Dispense Refill  . Albuterol Sulfate 108 (90 BASE) MCG/ACT AEPB Inhale 2 puffs into  the lungs 4 (four) times daily as needed (wheezing and sob).    . ALPRAZolam (XANAX) 1 MG tablet TAKE 1 TABLET BY MOUTH FOUR TIMES DAILY 120 tablet 2  . amLODipine (NORVASC) 5 MG tablet Take 1 tablet (5 mg total) by mouth daily. 30 tablet 6  . b complex vitamins tablet Take 1 tablet by mouth daily.    . bisoprolol (ZEBETA) 5 MG tablet Take 5 mg by mouth daily. Takes 1/2 tablet daily    . bisoprolol (ZEBETA) 5 MG tablet TAKE 1/2 TABLET BY MOUTH EVERY DAY - PLEASE CUT TABLETS IN HALF FOR PATIENT 15 tablet 3  . buPROPion (WELLBUTRIN XL) 150 MG 24 hr tablet Take 150 mg by mouth daily.    . clopidogrel (PLAVIX) 75 MG tablet TAKE 1 TABLET BY MOUTH DAILY WITH BREAKFAST. 30 tablet 6  . ezetimibe (ZETIA) 10 MG tablet Take 1 tablet (10 mg total) by mouth daily. 90 tablet 3  . fluticasone (FLONASE) 50 MCG/ACT nasal spray USE 2 SPRAYS INTO EACH NOSTRIL DAILY. 16 g 2  . hydrochlorothiazide (HYDRODIURIL) 25 MG tablet Take 1 tablet (25 mg total) by mouth daily. 90 tablet 3  . isosorbide mononitrate (IMDUR) 30 MG 24 hr tablet TAKE 1 TABLET BY MOUTH EVERY DAY 30 tablet 11  . losartan (COZAAR) 100 MG tablet Take 0.5 tablets (50 mg total) by mouth daily. 45 tablet 2  .  multivitamin (THERAGRAN) per tablet Take 1 tablet by mouth daily.      . nitroGLYCERIN (NITROSTAT) 0.4 MG SL tablet Place 1 tablet (0.4 mg total) under the tongue every 5 (five) minutes as needed for chest pain. 25 tablet 3  . omega-3 acid ethyl esters (LOVAZA) 1 g capsule TAKE 2 CAPSULES BY MOUTH TWICE DAILY AS NEEDED 120 capsule 3  . pantoprazole (PROTONIX) 40 MG tablet TAKE 1 TABLET BY MOUTH DAILY 30 tablet 6  . potassium chloride SA (K-DUR,KLOR-CON) 20 MEQ tablet Take 1 tablet (20 mEq total) by mouth 2 (two) times daily. 180 tablet 3  . rivaroxaban (XARELTO) 20 MG TABS tablet Take 1 tablet (20 mg total) by mouth daily with supper. 30 tablet 6   No current facility-administered medications for this visit.    Allergies:  Anoro ellipta  [umeclidinium-vilanterol]; Stiolto respimat [tiotropium bromide-olodaterol]; Fenofibrate; Lipitor [atorvastatin calcium]; Niaspan [niacin]; and Zocor [simvastatin]   Social History: The patient  reports that he quit smoking about 19 years ago. His smoking use included Cigars. He quit after 10.00 years of use. He has never used smokeless tobacco. He reports that he does not drink alcohol or use drugs.   Family History: The patient's family history includes Heart attack in his father; Heart disease in his father; Hypertension in his mother; Stroke in his mother.   ROS:  Please see the history of present illness. Otherwise, complete review of systems is positive for {NONE DEFAULTED:18576::"none"}.  All other systems are reviewed and negative.   Physical Exam: VS:  There were no vitals taken for this visit., BMI There is no height or weight on file to calculate BMI.  Wt Readings from Last 3 Encounters:  02/10/16 192 lb 6.4 oz (87.3 kg)  01/07/16 186 lb (84.4 kg)  12/07/15 190 lb (86.2 kg)    General: Elderly male, patient appears comfortable at rest. HEENT: Conjunctiva and lids normal, oropharynx clear. Neck: Supple, no elevated JVP or carotid bruits, no thyromegaly. Lungs: Clear to auscultation, nonlabored breathing at rest. Cardiac: Regular rate and rhythm, no S3, soft systolic murmur, no pericardial rub. Abdomen: Soft, nontender, bowel sounds present, no guarding or rebound. Extremities: Trace leg edema, distal pulses 2+. Skin: Warm and dry. Musculoskeletal: No kyphosis. Neuropsychiatric: Alert and oriented x3, affect grossly appropriate.  ECG: I personally reviewed the tracing from 12/07/2015 which showed sinus rhythm with prolonged PR interval.  Recent Labwork: 12/07/2015: ALT 24; AST 23; BUN 10; Creatinine, Ser 0.90; Hemoglobin 14.4; Platelets 221; Potassium 3.7; Sodium 134     Component Value Date/Time   CHOL 176 12/09/2015 0616   TRIG 157 (H) 12/09/2015 0616   HDL 30 (L)  12/09/2015 0616   CHOLHDL 5.9 12/09/2015 0616   VLDL 31 12/09/2015 0616   LDLCALC 115 (H) 12/09/2015 0616   LDLDIRECT 117.0 04/20/2014 1303  February 2018: Hemoglobin 14.3, platelets 230, BUN 16, creatinine 1.0, potassium 4.4  Other Studies Reviewed Today:  Cardiac catheterization 03/18/2015:    Conclusion   1. Prox LAD to Mid LAD lesion, 95% stenosed. Mid LAD lesion, 100% stenosed. Ost 2nd Diag to 2nd Diag lesion, 80% stenosed. 2. Ost Cx to Prox Cx lesion, 100% stenosed. 3. Mid RCA to Dist RCA lesion, 100% stenosed. Post Atrio lesion, 100% stenosed. 4. LIMA was injected is large. JR4 Catheter used 5. SVG-RCA & SVG-DIAG 100% occluded 6. Prox Graft lesion SVG-OM, 99% stenosed. Very large - TIMI 1 flow to graft insertion, but no flow in the OM. 7. Following PCI of SVG-OM  with distal protection device using Synergy DES, Post intervention, there is a 0% residual stenosis. 8. Mid Cx to Dist Cx lesion, 65% stenosed - not identified until post PCI   Overall severe native coronary artery disease with occluded native mid LAD, proximal circumflex and mid RCA. Patent LIMA-LAD, but 2 of 3 vein grafts 100% occluded (SVG-RCA and SVG-diagonal). - 99% proximal stenosis of SVG-OM treated with DES stent. Initially there was concern basilar patient having history of Mallory-Weiss tear with severe GI bleed 8 years ago. I confirmed with Dr. Mare Ferrari, this should not be an issue being so far distant to that event. The plan is to use Plavix, but based on the length of the lesion, I felt it best to be using a drug-eluting stent Relatively difficult procedure due to the extent of thrombus and debris in the vessel with TIMI 1 flow pre-PCI.  Overall successful PCI of the SVG-OM1 and TIMI-3 flow.  Plan:  Monitor overnight in the postprocedure unit on 6 [facility].  Sheath removal with manual pressure for hemostasis  I would continue Plavix for minimum of 6 months, preferably one year. Would likely be  fine stopping Plavix after 3 months.  Continue other cardiac treatment. He has a severely elevated LVEDP and may require diuresis prior to discharge.   Echocardiogram 12/09/2015: Study Conclusions  - Left ventricle: The cavity size was normal. There was severe concentric hypertrophy. Systolic function was vigorous. The estimated ejection fraction was in the range of 65% to 70%. Wall motion was normal; there were no regional wall motion abnormalities. The study was not technically sufficient to allow evaluation of LV diastolic dysfunction due to atrial fibrillation. - Aortic valve: Trileaflet; mildly thickened, mildly calcified leaflets. Transvalvular velocity was minimally increased. There was no stenosis. There was mild regurgitation. - Ascending aorta: The ascending aorta was normal in size. - Mitral valve: Mildly thickened leaflets . There was mild regurgitation. - Left atrium: The atrium was mildly dilated. - Right ventricle: Systolic function was normal. - Right atrium: The atrium was normal in size. - Tricuspid valve: Structurally normal valve. There was trivial regurgitation. - Pulmonic valve: Structurally normal valve. - Inferior vena cava: The vessel was normal in size. - Pericardium, extracardiac: There was no pericardial effusion.  Assessment and Plan:    Current medicines were reviewed with the patient today.  No orders of the defined types were placed in this encounter.   Disposition:  Signed, Satira Sark, MD, [facility] 05/12/2016 12:53 PM    [facility] [place], [place], [place] [address] Phone: [phone]; Fax: [phone]

## 2016-05-12 NOTE — Telephone Encounter (Signed)
Pt says last week started bleeding from the rectum he stopped taking plavix at that time and says the bleeding stopped the next day and he hasn't taken plavix since then - says he is still taking Xarelto - also says he started tamiflu and prednisone last week as well after [facility] urgent care visit for flu symptoms says he only has a another day on those medications. Wanted Dr. Domenic Polite made aware of stopping plavix

## 2016-05-12 NOTE — Telephone Encounter (Signed)
Pt aware.

## 2016-05-13 ENCOUNTER — Ambulatory Visit: Payer: Medicare Other | Admitting: Cardiology

## 2016-05-18 ENCOUNTER — Other Ambulatory Visit: Payer: Self-pay | Admitting: Cardiology

## 2016-05-19 ENCOUNTER — Ambulatory Visit: Payer: Medicare Other | Admitting: Nurse Practitioner

## 2016-05-20 ENCOUNTER — Encounter: Payer: Self-pay | Admitting: Nurse Practitioner

## 2016-06-09 DIAGNOSIS — I6522 Occlusion and stenosis of left carotid artery: Secondary | ICD-10-CM | POA: Diagnosis not present

## 2016-06-09 DIAGNOSIS — I2510 Atherosclerotic heart disease of native coronary artery without angina pectoris: Secondary | ICD-10-CM | POA: Diagnosis not present

## 2016-06-09 DIAGNOSIS — K219 Gastro-esophageal reflux disease without esophagitis: Secondary | ICD-10-CM | POA: Diagnosis not present

## 2016-06-09 DIAGNOSIS — E782 Mixed hyperlipidemia: Secondary | ICD-10-CM | POA: Diagnosis not present

## 2016-06-09 DIAGNOSIS — R001 Bradycardia, unspecified: Secondary | ICD-10-CM | POA: Diagnosis not present

## 2016-06-09 DIAGNOSIS — Z0000 Encounter for general adult medical examination without abnormal findings: Secondary | ICD-10-CM | POA: Diagnosis not present

## 2016-06-09 DIAGNOSIS — I10 Essential (primary) hypertension: Secondary | ICD-10-CM | POA: Diagnosis not present

## 2016-06-09 DIAGNOSIS — F411 Generalized anxiety disorder: Secondary | ICD-10-CM | POA: Diagnosis not present

## 2016-06-09 DIAGNOSIS — R7301 Impaired fasting glucose: Secondary | ICD-10-CM | POA: Diagnosis not present

## 2016-06-09 DIAGNOSIS — I482 Chronic atrial fibrillation: Secondary | ICD-10-CM | POA: Diagnosis not present

## 2016-06-09 DIAGNOSIS — J449 Chronic obstructive pulmonary disease, unspecified: Secondary | ICD-10-CM | POA: Diagnosis not present

## 2016-06-11 ENCOUNTER — Ambulatory Visit: Payer: Medicare Other | Admitting: Cardiology

## 2016-06-11 DIAGNOSIS — R21 Rash and other nonspecific skin eruption: Secondary | ICD-10-CM | POA: Diagnosis not present

## 2016-06-11 DIAGNOSIS — R7301 Impaired fasting glucose: Secondary | ICD-10-CM | POA: Diagnosis not present

## 2016-06-11 DIAGNOSIS — I10 Essential (primary) hypertension: Secondary | ICD-10-CM | POA: Diagnosis not present

## 2016-06-11 DIAGNOSIS — I2510 Atherosclerotic heart disease of native coronary artery without angina pectoris: Secondary | ICD-10-CM | POA: Diagnosis not present

## 2016-06-11 DIAGNOSIS — J449 Chronic obstructive pulmonary disease, unspecified: Secondary | ICD-10-CM | POA: Diagnosis not present

## 2016-06-11 DIAGNOSIS — Z125 Encounter for screening for malignant neoplasm of prostate: Secondary | ICD-10-CM | POA: Diagnosis not present

## 2016-06-15 ENCOUNTER — Other Ambulatory Visit: Payer: Self-pay | Admitting: Cardiology

## 2016-06-15 DIAGNOSIS — I119 Hypertensive heart disease without heart failure: Secondary | ICD-10-CM

## 2016-06-29 NOTE — Progress Notes (Signed)
Cardiology Office Note  Date: 06/30/2016   ID: William Elliott, DOB [birth year], MRN [id number]  PCP: William Neighbors, MD  Primary Cardiologist: William Lesches, MD   Chief Complaint  Patient presents with  . Coronary Artery Disease  . PAF    History of Present Illness: William Elliott is a 76 y.o. male last seen in November 2017. He presents for a follow-up visit. Reports no angina symptoms or palpitations on current regimen.  Since last encounter we have taken him off Plavix. He states that he was having some hematochezia which resolved completely. He remains on Xarelto and did have follow-up lab work in February. He continues to follow with Dr. Nevada Elliott. Remaining cardiac regimen is stable and outlined below.  Cardiac studies from last year or detailed below.  Past Medical History:  Diagnosis Date  . Acute lower GI bleeding 2008  . Anxiety   . Asthma   . Carotid artery disease (William Elliott)    Asymptomatic left carotid bruit  . COPD (chronic obstructive pulmonary disease) ([place])   . Coronary artery disease    a. CABG - 1999 with LIMA-LAD, SVG-DIAG, SVG-OM, SVG-RPDA  b. Cath in setting of NSTEMI 03/18/2015: patent LIMA-LAD, occluded SVG-RCA, occluded SVG-D1, 99% stenosis of SVG-OM treated w/ DES  . Depression   . Dyslipidemia   . Erectile dysfunction   . Essential hypertension   . History of blood transfusion 2008  . History of stroke   . Hyperlipidemia   . Myocardial infarction [facility])     Past Surgical History:  Procedure Laterality Date  . CARDIAC CATHETERIZATION    . CARDIAC CATHETERIZATION N/A 03/18/2015   Procedure: Left Heart Cath and Cors/Grafts Angiography;  Surgeon: Leonie Man, MD;  Location: [place] [facility];  Service: Cardiovascular;  Laterality: N/A;  . CARDIAC CATHETERIZATION N/A 03/18/2015   Procedure: Coronary Stent Intervention;  Surgeon: Leonie Man, MD;  Location: [place] [facility];  Service: Cardiovascular;  Laterality: N/A;  . CATARACT EXTRACTION W/  INTRAOCULAR LENS  IMPLANT, BILATERAL Bilateral   . CORONARY ANGIOPLASTY    . CORONARY ARTERY BYPASS GRAFT  1999  . FRACTURE SURGERY    . HERNIA REPAIR    . [facility]; took bone out of my right hip  . TONSILLECTOMY    . UMBILICAL HERNIA REPAIR  ~ 2000    Current Outpatient Prescriptions  Medication Sig Dispense Refill  . Albuterol Sulfate 108 (90 BASE) MCG/ACT AEPB Inhale 2 puffs into the lungs 4 (four) times daily as needed (wheezing and sob).    . ALPRAZolam (XANAX) 1 MG tablet TAKE 1 TABLET BY MOUTH FOUR TIMES DAILY 120 tablet 2  . amLODipine (NORVASC) 5 MG tablet TAKE 1 TABLET BY MOUTH DAILY. 30 tablet 1  . b complex vitamins tablet Take 1 tablet by mouth daily.    . bisoprolol (ZEBETA) 5 MG tablet TAKE 1/2 TABLET BY MOUTH EVERY DAY 15 tablet 3  . ezetimibe (ZETIA) 10 MG tablet Take 1 tablet (10 mg total) by mouth daily. 90 tablet 3  . fluticasone (FLONASE) 50 MCG/ACT nasal spray USE 2 SPRAYS INTO EACH NOSTRIL DAILY. 16 g 2  . hydrochlorothiazide (HYDRODIURIL) 25 MG tablet Take 1 tablet (25 mg total) by mouth daily. 90 tablet 3  . isosorbide mononitrate (IMDUR) 30 MG 24 hr tablet TAKE 1 TABLET BY MOUTH EVERY DAY 30 tablet 11  . losartan (COZAAR) 100 MG tablet Take 0.5 tablets (50 mg  total) by mouth daily. 45 tablet 2  . multivitamin (THERAGRAN) per tablet Take 1 tablet by mouth daily.      . nitroGLYCERIN (NITROSTAT) 0.4 MG SL tablet Place 1 tablet (0.4 mg total) under the tongue every 5 (five) minutes as needed for chest pain. 25 tablet 3  . omega-3 acid ethyl esters (LOVAZA) 1 g capsule TAKE 2 CAPSULES BY MOUTH TWICE DAILY AS NEEDED 120 capsule 3  . pantoprazole (PROTONIX) 40 MG tablet TAKE 1 TABLET BY MOUTH DAILY 30 tablet 6  . potassium chloride SA (K-DUR,KLOR-CON) 20 MEQ tablet Take 1 tablet (20 mEq total) by mouth 2 (two) times daily. 180 tablet 3  . rivaroxaban (XARELTO) 20 MG TABS tablet Take 1 tablet (20 mg total) by mouth daily with  supper. 30 tablet 6   No current facility-administered medications for this visit.    Allergies:  Anoro ellipta [umeclidinium-vilanterol]; Stiolto respimat [tiotropium bromide-olodaterol]; Fenofibrate; Lipitor [atorvastatin calcium]; Niaspan [niacin]; and Zocor [simvastatin]   Social History: The patient  reports that he quit smoking about 19 years ago. His smoking use included Cigars. He quit after 10.00 years of use. He has never used smokeless tobacco. He reports that he does not drink alcohol or use drugs.   ROS:  Please see the history of present illness. Otherwise, complete review of systems is positive for none.  All other systems are reviewed and negative.   Physical Exam: VS:  BP (!) 143/70   Pulse (!) 50   Ht 5\' 10"  (1.778 m)   Wt 210 lb 3.2 oz (95.3 kg)   SpO2 97%   BMI 30.16 kg/m , BMI Body mass index is 30.16 kg/m.  Wt Readings from Last 3 Encounters:  06/30/16 210 lb 3.2 oz (95.3 kg)  02/10/16 192 lb 6.4 oz (87.3 kg)  01/07/16 186 lb (84.4 kg)    General: Elderly male, patient appears comfortable at rest. HEENT: Conjunctiva and lids normal, oropharynx clear. Neck: Supple, no elevated JVP or carotid bruits, no thyromegaly. Lungs: Clear to auscultation, nonlabored breathing at rest. Cardiac: Regular rate and rhythm, no S3, soft systolic murmur, no pericardial rub. Abdomen: Soft, nontender, bowel sounds present, no guarding or rebound. Extremities: Trace leg edema, distal pulses 2+. Skin: Warm and dry. Musculoskeletal: No kyphosis. Neuropsychiatric: Alert and oriented x3, affect grossly appropriate.  ECG: I personally reviewed the tracing from 12/07/2015 which showed sinus rhythm with prolonged PR interval.  Recent Labwork: 12/07/2015: ALT 24; AST 23; BUN 10; Creatinine, Ser 0.90; Hemoglobin 14.4; Platelets 221; Potassium 3.7; Sodium 134     Component Value Date/Time   CHOL 176 12/09/2015 0616   TRIG 157 (H) 12/09/2015 0616   HDL 30 (L) 12/09/2015 0616   CHOLHDL  5.9 12/09/2015 0616   VLDL 31 12/09/2015 0616   LDLCALC 115 (H) 12/09/2015 0616   LDLDIRECT 117.0 04/20/2014 1303    Other Studies Reviewed Today:  Cardiac catheterization 03/18/2015:    Conclusion   1. Prox LAD to Mid LAD lesion, 95% stenosed. Mid LAD lesion, 100% stenosed. Ost 2nd Diag to 2nd Diag lesion, 80% stenosed. 2. Ost Cx to Prox Cx lesion, 100% stenosed. 3. Mid RCA to Dist RCA lesion, 100% stenosed. Post Atrio lesion, 100% stenosed. 4. LIMA was injected is large. JR4 Catheter used 5. SVG-RCA & SVG-DIAG 100% occluded 6. Prox Graft lesion SVG-OM, 99% stenosed. Very large - TIMI 1 flow to graft insertion, but no flow in the OM. 7. Following PCI of SVG-OM with distal protection device using Synergy DES,  Post intervention, there is a 0% residual stenosis. 8. Mid Cx to Dist Cx lesion, 65% stenosed - not identified until post PCI   Overall severe native coronary artery disease with occluded native mid LAD, proximal circumflex and mid RCA. Patent LIMA-LAD, but 2 of 3 vein grafts 100% occluded (SVG-RCA and SVG-diagonal). - 99% proximal stenosis of SVG-OM treated with DES stent. Initially there was concern basilar patient having history of Mallory-Weiss tear with severe GI bleed 8 years ago. I confirmed with Dr. Mare Ferrari, this should not be an issue being so far distant to that event. The plan is to use Plavix, but based on the length of the lesion, I felt it best to be using a drug-eluting stent Relatively difficult procedure due to the extent of thrombus and debris in the vessel with TIMI 1 flow pre-PCI.  Overall successful PCI of the SVG-OM1 and TIMI-3 flow.  Plan:  Monitor overnight in the postprocedure unit on 6 [facility].  Sheath removal with manual pressure for hemostasis  I would continue Plavix for minimum of 6 months, preferably one year. Would likely be fine stopping Plavix after 3 months.  Continue other cardiac treatment. He has a severely elevated LVEDP and may  require diuresis prior to discharge.   Echocardiogram 12/09/2015: Study Conclusions  - Left ventricle: The cavity size was normal. There was severe concentric hypertrophy. Systolic function was vigorous. The estimated ejection fraction was in the range of 65% to 70%. Wall motion was normal; there were no regional wall motion abnormalities. The study was not technically sufficient to allow evaluation of LV diastolic dysfunction due to atrial fibrillation. - Aortic valve: Trileaflet; mildly thickened, mildly calcified leaflets. Transvalvular velocity was minimally increased. There was no stenosis. There was mild regurgitation. - Ascending aorta: The ascending aorta was normal in size. - Mitral valve: Mildly thickened leaflets . There was mild regurgitation. - Left atrium: The atrium was mildly dilated. - Right ventricle: Systolic function was normal. - Right atrium: The atrium was normal in size. - Tricuspid valve: Structurally normal valve. There was trivial regurgitation. - Pulmonic valve: Structurally normal valve. - Inferior vena cava: The vessel was normal in size. - Pericardium, extracardiac: There was no pericardial effusion.  Assessment and Plan:  1. Symptomatically stable CAD status post CABG with graft disease documented last January. Patient is status post DES intervention to the SVG to obtuse marginal, is now off Plavix. No active angina symptoms on medical regimen including beta blocker, Imdur, and Zetia.  2. Paroxysmal atrial fibrillation, asymptomatic in terms of palpitations. He continues on Xarelto, recent lab work reviewed above. Continue observation.  3. Hyperlipidemia, on Zetia with history of statin intolerance.  4. Essential hypertension, no changes made present regimen.  Current medicines were reviewed with the patient today.  Disposition: Follow-up in 6 months.  Signed, Satira Sark, MD, [facility] 06/30/2016 4:48 PM    [facility] [place], [place], [place] [address] Phone: [phone]; Fax: [phone]

## 2016-06-30 ENCOUNTER — Ambulatory Visit (INDEPENDENT_AMBULATORY_CARE_PROVIDER_SITE_OTHER): Payer: Medicare Other | Admitting: Cardiology

## 2016-06-30 ENCOUNTER — Encounter: Payer: Self-pay | Admitting: Cardiology

## 2016-06-30 VITALS — BP 143/70 | HR 50 | Ht 70.0 in | Wt 210.2 lb

## 2016-06-30 DIAGNOSIS — Z789 Other specified health status: Secondary | ICD-10-CM

## 2016-06-30 DIAGNOSIS — I25810 Atherosclerosis of coronary artery bypass graft(s) without angina pectoris: Secondary | ICD-10-CM | POA: Diagnosis not present

## 2016-06-30 DIAGNOSIS — I480 Paroxysmal atrial fibrillation: Secondary | ICD-10-CM

## 2016-06-30 DIAGNOSIS — E782 Mixed hyperlipidemia: Secondary | ICD-10-CM | POA: Diagnosis not present

## 2016-06-30 DIAGNOSIS — I10 Essential (primary) hypertension: Secondary | ICD-10-CM | POA: Diagnosis not present

## 2016-06-30 NOTE — Patient Instructions (Signed)

## 2016-07-02 ENCOUNTER — Telehealth: Payer: Self-pay | Admitting: Nurse Practitioner

## 2016-07-02 NOTE — Telephone Encounter (Signed)
Reviewed CBC with 06/11/2016 within normal limits CMP within normal limits Lipid panel LDL 127 Hemoglobin A1c 5.9

## 2016-07-03 ENCOUNTER — Other Ambulatory Visit: Payer: Self-pay | Admitting: Cardiology

## 2016-07-03 DIAGNOSIS — I119 Hypertensive heart disease without heart failure: Secondary | ICD-10-CM

## 2016-10-04 ENCOUNTER — Other Ambulatory Visit: Payer: Self-pay | Admitting: Cardiology

## 2016-10-04 DIAGNOSIS — I119 Hypertensive heart disease without heart failure: Secondary | ICD-10-CM

## 2016-11-01 ENCOUNTER — Emergency Department (HOSPITAL_COMMUNITY)
Admission: EM | Admit: 2016-11-01 | Discharge: 2016-11-01 | Disposition: A | Discharge status: Home / Self Care | Payer: Medicare Other | Attending: Emergency Medicine | Admitting: Emergency Medicine

## 2016-11-01 ENCOUNTER — Encounter (HOSPITAL_COMMUNITY): Payer: Self-pay | Admitting: Emergency Medicine

## 2016-11-01 DIAGNOSIS — Z951 Presence of aortocoronary bypass graft: Secondary | ICD-10-CM | POA: Insufficient documentation

## 2016-11-01 DIAGNOSIS — J45909 Unspecified asthma, uncomplicated: Secondary | ICD-10-CM | POA: Insufficient documentation

## 2016-11-01 DIAGNOSIS — I2510 Atherosclerotic heart disease of native coronary artery without angina pectoris: Secondary | ICD-10-CM | POA: Insufficient documentation

## 2016-11-01 DIAGNOSIS — J449 Chronic obstructive pulmonary disease, unspecified: Secondary | ICD-10-CM | POA: Insufficient documentation

## 2016-11-01 DIAGNOSIS — Z87891 Personal history of nicotine dependence: Secondary | ICD-10-CM | POA: Insufficient documentation

## 2016-11-01 DIAGNOSIS — Z760 Encounter for issue of repeat prescription: Secondary | ICD-10-CM | POA: Insufficient documentation

## 2016-11-01 DIAGNOSIS — I10 Essential (primary) hypertension: Secondary | ICD-10-CM | POA: Diagnosis not present

## 2016-11-01 DIAGNOSIS — Z7901 Long term (current) use of anticoagulants: Secondary | ICD-10-CM | POA: Insufficient documentation

## 2016-11-01 DIAGNOSIS — Z79899 Other long term (current) drug therapy: Secondary | ICD-10-CM | POA: Insufficient documentation

## 2016-11-01 MED ORDER — ALPRAZOLAM 1 MG PO TABS: 1.0000 mg | ORAL_TABLET | Freq: Every evening | ORAL | 0 refills | Status: DC | PRN

## 2016-11-01 NOTE — Discharge Instructions (Signed)
Follow-up with Dr. Nevada Crane on Tuesday

## 2016-11-01 NOTE — ED Provider Notes (Signed)
[facility] DEPT Provider Note   CSN: [id number] Arrival date & time: 11/01/16  1539     History   Chief Complaint Chief Complaint  Patient presents with  . Medication Refill    HPI William Elliott is a 76 y.o. male.  HPI  William Elliott is a 76 y.o. male who presents to the Emergency Department requesting medication refill.  He has his prescription bottle for Xanax 1 mg every 8 hrs. Last filled on 09/30/16.  Pt took last tablet last evening.  States his PCP has been prescribing this medication for some time.  He tried to contact his PCP, Dr. Delphina Cahill last week and was unable to get a return phone call.  He denies symptoms at present.  Denies chest pain, shortness of breath, anxiety at present, and seizures.   Past Medical History:  Diagnosis Date  . Acute lower GI bleeding 2008  . Anxiety   . Asthma   . Carotid artery disease ([place])    Asymptomatic left carotid bruit  . COPD (chronic obstructive pulmonary disease) ([place])   . Coronary artery disease    a. CABG - 1999 with LIMA-LAD, SVG-DIAG, SVG-OM, SVG-RPDA  b. Cath in setting of NSTEMI 03/18/2015: patent LIMA-LAD, occluded SVG-RCA, occluded SVG-D1, 99% stenosis of SVG-OM treated w/ DES  . Depression   . Dyslipidemia   . Erectile dysfunction   . Essential hypertension   . History of blood transfusion 2008  . History of stroke   . Hyperlipidemia   . Myocardial infarction [facility])     Patient Active Problem List   Diagnosis Date Noted  . PAF (paroxysmal atrial fibrillation) ([place]) 12/10/2015  . Aneurysm ([place])   . Transient confusion   . Cerebral thrombosis with cerebral infarction 12/08/2015  . Cerebral embolism with cerebral infarction 12/08/2015  . Intracerebral hemorrhage 12/08/2015  . Confusion 12/07/2015  . NSTEMI (non-ST elevated myocardial infarction) ([place]) 03/18/2015  . Coronary artery disease involving coronary bypass graft of native heart with unstable angina pectoris ([place])   . ASCVD (arteriosclerotic  cardiovascular disease) 03/17/2015  . Crescendo angina ([place]) 03/16/2015  . Essential hypertension 07/27/2014  . Dyspnea 07/26/2014  . Benign hypertensive heart disease without heart failure 08/04/2013  . BPH associated with nocturia 03/17/2011  . Coronary artery disease involving native coronary artery with unstable angina pectoris ([place])   . Hyperlipidemia   . Carotid bruit   . Ischemic heart disease   . Dyslipidemia   . COPD (chronic obstructive pulmonary disease) ([place])     Past Surgical History:  Procedure Laterality Date  . CARDIAC CATHETERIZATION    . CARDIAC CATHETERIZATION N/A 03/18/2015   Procedure: Left Heart Cath and Cors/Grafts Angiography;  Surgeon: Leonie Man, MD;  Location: [place] [facility];  Service: Cardiovascular;  Laterality: N/A;  . CARDIAC CATHETERIZATION N/A 03/18/2015   Procedure: Coronary Stent Intervention;  Surgeon: Leonie Man, MD;  Location: [place] [facility] LAB;  Service: Cardiovascular;  Laterality: N/A;  . CATARACT EXTRACTION W/ INTRAOCULAR LENS  IMPLANT, BILATERAL Bilateral   . CORONARY ANGIOPLASTY    . CORONARY ARTERY BYPASS GRAFT  1999  . FRACTURE SURGERY    . HERNIA REPAIR    . [facility]; took bone out of my right hip  . TONSILLECTOMY    . UMBILICAL HERNIA REPAIR  ~ 2000       Home Medications    Prior to Admission medications   Medication Sig Start Date  End Date Taking? Authorizing Provider  Albuterol Sulfate 108 (90 BASE) MCG/ACT AEPB Inhale 2 puffs into the lungs 4 (four) times daily as needed (wheezing and sob).    [provider]  ALPRAZolam Duanne Moron) 1 MG tablet TAKE 1 TABLET BY MOUTH FOUR TIMES DAILY 02/26/15   Darlin Coco, MD  amLODipine (NORVASC) 5 MG tablet TAKE 1 TABLET BY MOUTH EVERY DAY 07/03/16   Satira Sark, MD  b complex vitamins tablet Take 1 tablet by mouth daily.    [provider]  bisoprolol (ZEBETA) 5 MG tablet TAKE 1/2 TABLET BY MOUTH EVERY  DAY 10/05/16   Satira Sark, MD  fluticasone (FLONASE) 50 MCG/ACT nasal spray USE 2 SPRAYS INTO EACH NOSTRIL DAILY. 11/30/14   Darlin Coco, MD  hydrochlorothiazide (HYDRODIURIL) 25 MG tablet Take 1 tablet (25 mg total) by mouth daily. 11/19/15   Satira Sark, MD  isosorbide mononitrate (IMDUR) 30 MG 24 hr tablet TAKE 1 TABLET BY MOUTH EVERY DAY 02/18/16   Satira Sark, MD  losartan (COZAAR) 100 MG tablet TAKE 1/2 TABLET BY MOUTH DAILY. 07/03/16   Satira Sark, MD  multivitamin [facility]) per tablet Take 1 tablet by mouth daily.      [provider]  nitroGLYCERIN (NITROSTAT) 0.4 MG SL tablet Place 1 tablet (0.4 mg total) under the tongue every 5 (five) minutes as needed for chest pain. 03/19/15   Strader, Fransisco Hertz, PA-C  omega-3 acid ethyl esters (LOVAZA) 1 g capsule TAKE 2 CAPSULES BY MOUTH TWICE DAILY AS NEEDED 10/05/16   Satira Sark, MD  pantoprazole (PROTONIX) 40 MG tablet TAKE 1 TABLET BY MOUTH DAILY 04/15/16   Satira Sark, MD  potassium chloride SA (K-DUR,KLOR-CON) 20 MEQ tablet Take 1 tablet (20 mEq total) by mouth 2 (two) times daily. 11/19/15   Satira Sark, MD  XARELTO 20 MG TABS tablet TAKE 1 TABLET BY MOUTH EVERY DAY 10/05/16   Satira Sark, MD  ZETIA 10 MG tablet TAKE 1 TABLET BY MOUTH DAILY 10/05/16   Satira Sark, MD    Family History Family History  Problem Relation Age of Onset  . Heart disease Father   . Heart attack Father   . Hypertension Mother   . Stroke Mother     Social History Social History  Substance Use Topics  . Smoking status: Former Smoker    Years: 10.00    Types: Cigars    Quit date: 03/02/1997  . Smokeless tobacco: Never Used     Comment: smoked cigars x 10 yrs, never smoked cigarettes  . Alcohol use No     Comment: "drank some when I was young; quit in the 1990s"     Allergies   Anoro ellipta [umeclidinium-vilanterol]; Stiolto respimat [tiotropium bromide-olodaterol]; Fenofibrate; Lipitor  [atorvastatin calcium]; Niaspan [niacin]; and Zocor [simvastatin]   Review of Systems Review of Systems  Constitutional: Negative for chills, fatigue and fever.  Respiratory: Negative for cough and shortness of breath.   Cardiovascular: Negative for chest pain.  Gastrointestinal: Negative for abdominal pain, nausea and vomiting.  Genitourinary: Negative for dysuria.  Musculoskeletal: Negative for arthralgias, back pain, myalgias, neck pain and neck stiffness.  Skin: Negative for rash.  Neurological: Negative for dizziness, seizures, syncope, speech difficulty, weakness and numbness.  Hematological: Does not bruise/bleed easily.  Psychiatric/Behavioral: The patient is not nervous/anxious.      Physical Exam Updated Vital Signs BP (!) 166/67 (BP Location: Right Arm)   Pulse (!) 50  Temp 97.9 F (36.6 C) (Oral)   Resp 18   Ht 5\' 10"  (1.778 m)   Wt 88.5 kg (195 lb)   SpO2 97%   BMI 27.98 kg/m   Physical Exam  Constitutional: He is oriented to person, place, and time. He appears well-developed and well-nourished. No distress.  HENT:  Head: Normocephalic and atraumatic.  Mouth/Throat: Oropharynx is clear and moist.  Eyes: Pupils are equal, round, and reactive to light. EOM are normal.  Neck: Normal range of motion. Neck supple.  Cardiovascular: Normal rate, regular rhythm and intact distal pulses.   Pulmonary/Chest: Effort normal and breath sounds normal.  Abdominal: Soft. There is no tenderness. There is no rebound and no guarding.  Musculoskeletal: Normal range of motion. He exhibits no tenderness.  Lymphadenopathy:    He has no cervical adenopathy.  Neurological: He is alert and oriented to person, place, and time. No sensory deficit.  Skin: Skin is warm and dry.  Psychiatric: He has a normal mood and affect. Judgment normal.  Nursing note and vitals reviewed.    ED Treatments / Results  Labs (all labs ordered are listed, but only abnormal results are  displayed) Labs Reviewed - No data to display  EKG  EKG Interpretation None       Radiology No results found.  Procedures Procedures (including critical care time)  Medications Ordered in ED Medications - No data to display   Initial Impression / Assessment and Plan / ED Course  I have reviewed the triage vital signs and the nursing notes.  Pertinent labs & imaging results that were available during my care of the patient were reviewed by me and considered in my medical decision making (see chart for details).       Pt reviewed on the [place] and receives rx from PCP for alprazolam 1 mg monthly.  Last filled on 09/30/16.  Pt is asymptomatic.  Vitals wnml.  No hx of seizures.  Has contacted PCP regarding refill.  Understands that I will only prescribe enough medication until he can f/u with PCP on Tuesday.     Final Clinical Impressions(s) / ED Diagnoses   Final diagnoses:  Encounter for medication refill    New Prescriptions New Prescriptions   No medications on file     Kem Parkinson, PA-C 11/01/16 [place], [place], DO 11/04/16 1948

## 2016-11-01 NOTE — ED Triage Notes (Signed)
Pt given RX of Alprazolam 1 mg by Dr. Nevada Crane.  Pt states calling in RX on Wednesday but the pharmacy couldn't get in touch with him.

## 2016-11-02 ENCOUNTER — Other Ambulatory Visit: Payer: Self-pay | Admitting: Cardiology

## 2016-11-02 DIAGNOSIS — I119 Hypertensive heart disease without heart failure: Secondary | ICD-10-CM

## 2016-11-25 ENCOUNTER — Other Ambulatory Visit: Payer: Self-pay | Admitting: Cardiology

## 2016-11-25 MED ORDER — EZETIMIBE 10 MG PO TABS: 10.0000 mg | ORAL_TABLET | Freq: Every day | ORAL | 3 refills | Status: DC

## 2016-11-25 NOTE — Telephone Encounter (Signed)
PRESCRIPTION SENT.

## 2016-11-25 NOTE — Telephone Encounter (Signed)
Refill on  ZETIA 10 MG tablet  Please call to [facility].

## 2016-11-26 ENCOUNTER — Telehealth: Payer: Self-pay | Admitting: Cardiology

## 2016-11-26 NOTE — Telephone Encounter (Signed)
Patient called in regards to taking Zetia 10 mg.  Please call [phone].

## 2016-11-30 ENCOUNTER — Telehealth: Payer: Self-pay | Admitting: Cardiology

## 2016-11-30 NOTE — Telephone Encounter (Signed)
Patient was returning a call

## 2016-11-30 NOTE — Telephone Encounter (Signed)
See previous open phone note

## 2016-11-30 NOTE — Telephone Encounter (Signed)
Pt questioning whether he should be on 10 mg Zetia - says he thought it was 20 mg and recent refill says 10 mg. Per last few office notes with Dr Domenic Polite pt should be taking 10 mg Zetia. Pt voiced understanding

## 2016-11-30 NOTE — Telephone Encounter (Signed)
LM to return call Friday and today

## 2016-12-06 ENCOUNTER — Other Ambulatory Visit: Payer: Self-pay | Admitting: Cardiology

## 2017-01-01 DIAGNOSIS — Z8673 Personal history of transient ischemic attack (TIA), and cerebral infarction without residual deficits: Secondary | ICD-10-CM | POA: Diagnosis not present

## 2017-01-01 DIAGNOSIS — K219 Gastro-esophageal reflux disease without esophagitis: Secondary | ICD-10-CM | POA: Diagnosis not present

## 2017-01-01 DIAGNOSIS — R001 Bradycardia, unspecified: Secondary | ICD-10-CM | POA: Diagnosis not present

## 2017-01-01 DIAGNOSIS — E785 Hyperlipidemia, unspecified: Secondary | ICD-10-CM | POA: Diagnosis not present

## 2017-01-01 DIAGNOSIS — I25810 Atherosclerosis of coronary artery bypass graft(s) without angina pectoris: Secondary | ICD-10-CM | POA: Diagnosis not present

## 2017-01-01 DIAGNOSIS — N401 Enlarged prostate with lower urinary tract symptoms: Secondary | ICD-10-CM | POA: Diagnosis not present

## 2017-01-01 DIAGNOSIS — I10 Essential (primary) hypertension: Secondary | ICD-10-CM | POA: Diagnosis not present

## 2017-01-01 DIAGNOSIS — I480 Paroxysmal atrial fibrillation: Secondary | ICD-10-CM | POA: Diagnosis not present

## 2017-01-01 DIAGNOSIS — I6523 Occlusion and stenosis of bilateral carotid arteries: Secondary | ICD-10-CM | POA: Diagnosis not present

## 2017-01-01 DIAGNOSIS — F419 Anxiety disorder, unspecified: Secondary | ICD-10-CM | POA: Diagnosis not present

## 2017-01-01 DIAGNOSIS — R7301 Impaired fasting glucose: Secondary | ICD-10-CM | POA: Diagnosis not present

## 2017-01-01 DIAGNOSIS — J449 Chronic obstructive pulmonary disease, unspecified: Secondary | ICD-10-CM | POA: Diagnosis not present

## 2017-01-07 DIAGNOSIS — E782 Mixed hyperlipidemia: Secondary | ICD-10-CM | POA: Diagnosis not present

## 2017-01-07 DIAGNOSIS — R7301 Impaired fasting glucose: Secondary | ICD-10-CM | POA: Diagnosis not present

## 2017-01-07 DIAGNOSIS — I10 Essential (primary) hypertension: Secondary | ICD-10-CM | POA: Diagnosis not present

## 2017-01-08 ENCOUNTER — Telehealth: Payer: Self-pay | Admitting: Cardiology

## 2017-01-08 MED ORDER — EZETIMIBE 10 MG PO TABS: 10.0000 mg | ORAL_TABLET | Freq: Every day | ORAL | 3 refills | Status: DC

## 2017-01-08 NOTE — Telephone Encounter (Signed)
Yes, fine to change back to brand name Zetia if he was having a rash on the generic formulation.

## 2017-01-08 NOTE — Telephone Encounter (Signed)
Patient notified.  New rx sent to [facility] for brand only.

## 2017-01-08 NOTE — Telephone Encounter (Signed)
Patient called starting that the Xarelto is causing a rash on his body. States that he has not taken medication in 5 days now.

## 2017-01-08 NOTE — Telephone Encounter (Signed)
Patient states that the Xarelto is fine.  States that the problem is with the generic Zetia (Ezetimibe).  Caused rash on chest, back, & legs.  Now been stopped x 5 days, everything feeling back to normal.  Stated this has happened to him in the past.  Does fine with the brand Zetia.  Message fwd to provider for okay to change back to brand.

## 2017-01-14 ENCOUNTER — Telehealth: Payer: Self-pay | Admitting: *Deleted

## 2017-01-14 NOTE — Telephone Encounter (Signed)
Attempted to notify patient - no answer on home or mobile number.

## 2017-01-14 NOTE — Telephone Encounter (Signed)
Zetia brand tablet approved for coverage:  Non-formulary.  Coverage good from 10/16/2016 to 01/14/2018.  [facility] made aware.

## 2017-01-15 NOTE — Telephone Encounter (Signed)
No answer at home number.  Patient notified via voicemail on mobile number.

## 2017-02-01 ENCOUNTER — Other Ambulatory Visit: Payer: Self-pay | Admitting: Cardiology

## 2017-02-01 DIAGNOSIS — I119 Hypertensive heart disease without heart failure: Secondary | ICD-10-CM

## 2017-02-09 ENCOUNTER — Ambulatory Visit: Payer: Medicare Other | Admitting: Cardiology

## 2017-02-12 ENCOUNTER — Ambulatory Visit (INDEPENDENT_AMBULATORY_CARE_PROVIDER_SITE_OTHER): Payer: Medicare Other | Admitting: Cardiology

## 2017-02-12 ENCOUNTER — Encounter: Payer: Self-pay | Admitting: Cardiology

## 2017-02-12 VITALS — BP 132/70 | HR 45 | Ht 70.0 in | Wt 215.0 lb

## 2017-02-12 DIAGNOSIS — Z789 Other specified health status: Secondary | ICD-10-CM | POA: Diagnosis not present

## 2017-02-12 DIAGNOSIS — I25810 Atherosclerosis of coronary artery bypass graft(s) without angina pectoris: Secondary | ICD-10-CM

## 2017-02-12 DIAGNOSIS — I25119 Atherosclerotic heart disease of native coronary artery with unspecified angina pectoris: Secondary | ICD-10-CM | POA: Diagnosis not present

## 2017-02-12 DIAGNOSIS — K921 Melena: Secondary | ICD-10-CM

## 2017-02-12 DIAGNOSIS — I480 Paroxysmal atrial fibrillation: Secondary | ICD-10-CM | POA: Diagnosis not present

## 2017-02-12 DIAGNOSIS — I10 Essential (primary) hypertension: Secondary | ICD-10-CM

## 2017-02-12 DIAGNOSIS — I209 Angina pectoris, unspecified: Secondary | ICD-10-CM

## 2017-02-12 MED ORDER — ASPIRIN EC 81 MG PO TBEC: 81.0000 mg | DELAYED_RELEASE_TABLET | Freq: Every day | ORAL | 3 refills | Status: DC

## 2017-02-12 MED ORDER — NITROGLYCERIN 0.4 MG SL SUBL: 0.4000 mg | SUBLINGUAL_TABLET | SUBLINGUAL | 3 refills | Status: DC | PRN

## 2017-02-12 NOTE — Patient Instructions (Signed)
Medication Instructions:  Your physician has recommended you make the following change in your medication:    STOP Xarelto   STOP Zebeta   START Aspirin 81 mg daily   Please continue all other medications as prescribed  Labwork:  CBC  Orders given today  Testing/Procedures: NONE  Follow-Up: Your physician wants you to follow-up in: [place]. You will receive a reminder letter in the mail two months in advance. If you don't receive a letter, please call our office to schedule the follow-up appointment.  Any Other Special Instructions Will Be Listed Below (If Applicable).  If you need a refill on your cardiac medications before your next appointment, please call your pharmacy.

## 2017-02-12 NOTE — Progress Notes (Signed)
Cardiology Office Note  Date: 02/12/2017   ID: SKYLER CAREL, DOB 25-Nov-1940, MRN [id number]  PCP: [name], MD  Primary Cardiologist: [name], MD   Chief Complaint  Patient presents with  . Coronary Artery Disease    History of Present Illness: William Elliott is a medically complex 76 y.o. male last seen in May.  He is here with his wife for a follow-up visit.  He does not report any angina symptoms or palpitations, no dizziness or syncope.  Has chronic fatigue.  I personally reviewed his ECG today which shows slow atrial fibrillation at 43 bpm.  We discussed stopping Zebeta at this point.  He also tells me that he has had some recurring rectal bleeding.  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  He has not seen a gastroenterologist recently.  He stopped taking Xarelto.  He continues to follow with Dr. Nevada Crane, we are requesting his most recent lab work.  Past Medical History:  Diagnosis Date  . Acute lower GI bleeding 2008  . Anxiety   . Asthma   . Carotid artery disease ([place])    Asymptomatic left carotid bruit  . COPD (chronic obstructive pulmonary disease) ([place])   . Coronary artery disease    a. CABG - 1999 with LIMA-LAD, SVG-DIAG, SVG-OM, SVG-RPDA  b. Cath in setting of NSTEMI 03/18/2015: patent LIMA-LAD, occluded SVG-RCA, occluded SVG-D1, 99% stenosis of SVG-OM treated w/ DES  . Depression   . Dyslipidemia   . Erectile dysfunction   . Essential hypertension   . History of blood transfusion 2008  . History of stroke   . Hyperlipidemia   . Myocardial infarction [facility])     Past Surgical History:  Procedure Laterality Date  . CARDIAC CATHETERIZATION    . CARDIAC CATHETERIZATION N/A 03/18/2015   Procedure: Left Heart Cath and Cors/Grafts Angiography;  Surgeon: Leonie Man, MD;  Location: [place] [facility];  Service: Cardiovascular;  Laterality: N/A;  . CARDIAC CATHETERIZATION N/A 03/18/2015   Procedure:  Coronary Stent Intervention;  Surgeon: Leonie Man, MD;  Location: [place] [facility];  Service: Cardiovascular;  Laterality: N/A;  . CATARACT EXTRACTION W/ INTRAOCULAR LENS  IMPLANT, BILATERAL Bilateral   . CORONARY ANGIOPLASTY    . CORONARY ARTERY BYPASS GRAFT  1999  . FRACTURE SURGERY    . HERNIA REPAIR    . [facility]; took bone out of my right hip  . TONSILLECTOMY    . UMBILICAL HERNIA REPAIR  ~ 2000    Current Outpatient Medications  Medication Sig Dispense Refill  . Albuterol Sulfate 108 (90 BASE) MCG/ACT AEPB Inhale 2 puffs into the lungs 4 (four) times daily as needed (wheezing and sob).    . ALPRAZolam (XANAX) 1 MG tablet Take 1 tablet (1 mg total) by mouth at bedtime as needed for anxiety. 10 tablet 0  . amLODipine (NORVASC) 5 MG tablet TAKE 1 TABLET BY MOUTH EVERY DAY 90 tablet 3  . b complex vitamins tablet Take 1 tablet by mouth daily.    Marland Kitchen ezetimibe (ZETIA) 10 MG tablet Take 1 tablet (10 mg total) daily by mouth. 90 tablet 3  . hydrochlorothiazide (HYDRODIURIL) 25 MG tablet TAKE 1 TABLET BY MOUTH EVERY DAY 90 tablet 3  . isosorbide mononitrate (IMDUR) 30 MG 24 hr tablet TAKE 1 TABLET BY MOUTH EVERY DAY 30 tablet 6  . losartan (COZAAR) 100 MG tablet TAKE  1/2 TABLET BY MOUTH DAILY. 45 tablet 6  . multivitamin (THERAGRAN) per tablet Take 1 tablet by mouth daily.      . nitroGLYCERIN (NITROSTAT) 0.4 MG SL tablet Place 1 tablet (0.4 mg total) under the tongue every 5 (five) minutes as needed for chest pain. 25 tablet 3  . omega-3 acid ethyl esters (LOVAZA) 1 g capsule TAKE 2 CAPSULES BY MOUTH TWICE DAILY AS NEEDED 120 capsule 6  . pantoprazole (PROTONIX) 40 MG tablet TAKE 1 TABLET BY MOUTH DAILY 30 tablet 6  . potassium chloride SA (K-DUR,KLOR-CON) 20 MEQ tablet TAKE 1 TABLET BY MOUTH TWICE DAILY 180 tablet 3  . aspirin EC 81 MG tablet Take 1 tablet (81 mg total) by mouth daily. 90 tablet 3   No current facility-administered  medications for this visit.    Allergies:  Anoro ellipta [umeclidinium-vilanterol]; Stiolto respimat [tiotropium bromide-olodaterol]; Fenofibrate; Lipitor [atorvastatin calcium]; Niaspan [niacin]; and Zocor [simvastatin]   Social History: The patient  reports that he quit smoking about 19 years ago. His smoking use included cigars. He quit after 10.00 years of use. he has never used smokeless tobacco. He reports that he does not drink alcohol or use drugs.   ROS:  Please see the history of present illness. Otherwise, complete review of systems is positive for hearing loss.  All other systems are reviewed and negative.   Physical Exam: VS:  BP 132/70   Pulse (!) 45   Ht 5\' 10"  (1.778 m)   Wt 215 lb (97.5 kg)   SpO2 97%   BMI 30.85 kg/m , BMI Body mass index is 30.85 kg/m.  Wt Readings from Last 3 Encounters:  02/12/17 215 lb (97.5 kg)  11/01/16 195 lb (88.5 kg)  06/30/16 210 lb 3.2 oz (95.3 kg)    General: Elderly male, appears comfortable at rest. HEENT: Conjunctiva and lids normal, oropharynx clear. Neck: Supple, no elevated JVP or carotid bruits, no thyromegaly. Lungs: Clear to auscultation, nonlabored breathing at rest. Cardiac: Irregularly irregular, no S3, soft systolic murmur, no pericardial rub. Abdomen: Soft, nontender, bowel sounds present, no guarding or rebound. Extremities: Trace ankle edema, distal pulses 2+. Skin: Warm and dry. Musculoskeletal: No kyphosis. Neuropsychiatric: Alert and oriented x3, affect grossly appropriate.  ECG: I personally reviewed the tracing from 12/07/2015 which showed sinus rhythm with prolonged PR interval.   Recent Labwork:  April 2018: Hemoglobin 13.3, platelets 253, BUN 10, creatinine 1.06, potassium 4.4, AST 20, ALT 19, cholesterol 188, triglycerides 104, HDL 40, LDL 127, hemoglobin A1c 5.9, TSH 2.74  Other Studies Reviewed Today:  Echocardiogram 12/09/2015: Study Conclusions  - Left ventricle: The cavity size was normal. There  was severe   concentric hypertrophy. Systolic function was vigorous. The   estimated ejection fraction was in the range of 65% to 70%. Wall   motion was normal; there were no regional wall motion   abnormalities. The study was not technically sufficient to allow   evaluation of LV diastolic dysfunction due to atrial   fibrillation. - Aortic valve: Trileaflet; mildly thickened, mildly calcified   leaflets. Transvalvular velocity was minimally increased. There   was no stenosis. There was mild regurgitation. - Ascending aorta: The ascending aorta was normal in size. - Mitral valve: Mildly thickened leaflets . There was mild   regurgitation. - Left atrium: The atrium was mildly dilated. - Right ventricle: Systolic function was normal. - Right atrium: The atrium was normal in size. - Tricuspid valve: Structurally normal valve. There was trivial   regurgitation. -  Pulmonic valve: Structurally normal valve. - Inferior vena cava: The vessel was normal in size. - Pericardium, extracardiac: There was no pericardial effusion.  Cardiac catheterization 03/18/2015:    Conclusion   1. Prox LAD to Mid LAD lesion, 95% stenosed. Mid LAD lesion, 100% stenosed. Ost 2nd Diag to 2nd Diag lesion, 80% stenosed. 2. Ost Cx to Prox Cx lesion, 100% stenosed. 3. Mid RCA to Dist RCA lesion, 100% stenosed. Post Atrio lesion, 100% stenosed. 4. LIMA was injected is large. JR4 Catheter used 5. SVG-RCA & SVG-DIAG 100% occluded 6. Prox Graft lesion SVG-OM, 99% stenosed. Very large - TIMI 1 flow to graft insertion, but no flow in the OM. 7. Following PCI of SVG-OM with distal protection device using Synergy DES, Post intervention, there is a 0% residual stenosis. 8. Mid Cx to Dist Cx lesion, 65% stenosed - not identified until post PCI   Overall severe native coronary artery disease with occluded native mid LAD, proximal circumflex and mid RCA. Patent LIMA-LAD, but 2 of 3 vein grafts 100% occluded (SVG-RCA and  SVG-diagonal). - 99% proximal stenosis of SVG-OM treated with DES stent. Initially there was concern basilar patient having history of Mallory-Weiss tear with severe GI bleed 8 years ago. I confirmed with Dr. Mare Ferrari, this should not be an issue being so far distant to that event. The plan is to use Plavix, but based on the length of the lesion, I felt it best to be using a drug-eluting stent Relatively difficult procedure due to the extent of thrombus and debris in the vessel with TIMI 1 flow pre-PCI.  Overall successful PCI of the SVG-OM1 and TIMI-3 flow.   Assessment and Plan:  1.  CAD with history of CABG and graft disease status post DES intervention to the SVG to OM in 2017.  Fortunately, he does not report progressive angina symptoms.  Refill provided for nitroglycerin, he had an old bottle.  2.  Paroxysmal to persistent atrial fibrillation.  Slow ventricular response noted today.  We will stop Zebeta.  He also stopped Xarelto due to recurrent hematochezia.  Recommending CBC, also requesting most recent lab work from Dr. Nevada Crane.  He was followed by gastroenterologist in the past, no recent evaluations.  Will start low-dose aspirin for now.  3.  Hyperlipidemia, on Zetia with known statin intolerance.  4.  Essential hypertension.  He is on Norvasc and Cozaar.  Blood pressure control is adequate today.  Current medicines were reviewed with the patient today.   Orders Placed This Encounter  Procedures  . CBC  . EKG 12-Lead    Disposition: Follow-up in 6 months.  Signed, Satira Sark, MD, [facility] 02/12/2017 4:31 PM    [place] [facility] [place] [address], [place], [place] [address] Phone: [phone]; Fax: [phone]

## 2017-02-19 DIAGNOSIS — I25119 Atherosclerotic heart disease of native coronary artery with unspecified angina pectoris: Secondary | ICD-10-CM | POA: Diagnosis not present

## 2017-02-26 ENCOUNTER — Telehealth: Payer: Self-pay

## 2017-02-26 NOTE — Telephone Encounter (Signed)
Left msg for patient. Per DPR ok to leave detailed msg on home phone. Routed to PCP

## 2017-02-26 NOTE — Telephone Encounter (Signed)
-----   Message from Satira Sark, MD sent at 02/26/2017  9:46 AM EST ----- Results reviewed. Hemoglobin normal. A copy of this test should be forwarded to [name], MD.

## 2017-04-26 ENCOUNTER — Other Ambulatory Visit: Payer: Self-pay | Admitting: Cardiology

## 2017-05-05 ENCOUNTER — Other Ambulatory Visit: Payer: Self-pay | Admitting: *Deleted

## 2017-05-05 DIAGNOSIS — I119 Hypertensive heart disease without heart failure: Secondary | ICD-10-CM

## 2017-05-05 MED ORDER — LOSARTAN POTASSIUM 100 MG PO TABS: 50.0000 mg | ORAL_TABLET | Freq: Every day | ORAL | 1 refills | Status: DC

## 2017-06-01 ENCOUNTER — Other Ambulatory Visit: Payer: Self-pay | Admitting: Cardiology

## 2017-06-11 DIAGNOSIS — K219 Gastro-esophageal reflux disease without esophagitis: Secondary | ICD-10-CM | POA: Diagnosis not present

## 2017-06-11 DIAGNOSIS — R7301 Impaired fasting glucose: Secondary | ICD-10-CM | POA: Diagnosis not present

## 2017-06-11 DIAGNOSIS — I25810 Atherosclerosis of coronary artery bypass graft(s) without angina pectoris: Secondary | ICD-10-CM | POA: Diagnosis not present

## 2017-06-11 DIAGNOSIS — R001 Bradycardia, unspecified: Secondary | ICD-10-CM | POA: Diagnosis not present

## 2017-06-11 DIAGNOSIS — I6523 Occlusion and stenosis of bilateral carotid arteries: Secondary | ICD-10-CM | POA: Diagnosis not present

## 2017-06-11 DIAGNOSIS — N401 Enlarged prostate with lower urinary tract symptoms: Secondary | ICD-10-CM | POA: Diagnosis not present

## 2017-06-11 DIAGNOSIS — E785 Hyperlipidemia, unspecified: Secondary | ICD-10-CM | POA: Diagnosis not present

## 2017-06-11 DIAGNOSIS — I10 Essential (primary) hypertension: Secondary | ICD-10-CM | POA: Diagnosis not present

## 2017-06-11 DIAGNOSIS — J449 Chronic obstructive pulmonary disease, unspecified: Secondary | ICD-10-CM | POA: Diagnosis not present

## 2017-06-11 DIAGNOSIS — I480 Paroxysmal atrial fibrillation: Secondary | ICD-10-CM | POA: Diagnosis not present

## 2017-06-11 DIAGNOSIS — Z8673 Personal history of transient ischemic attack (TIA), and cerebral infarction without residual deficits: Secondary | ICD-10-CM | POA: Diagnosis not present

## 2017-06-11 DIAGNOSIS — F419 Anxiety disorder, unspecified: Secondary | ICD-10-CM | POA: Diagnosis not present

## 2017-06-15 DIAGNOSIS — Z8673 Personal history of transient ischemic attack (TIA), and cerebral infarction without residual deficits: Secondary | ICD-10-CM | POA: Diagnosis not present

## 2017-06-15 DIAGNOSIS — I480 Paroxysmal atrial fibrillation: Secondary | ICD-10-CM | POA: Diagnosis not present

## 2017-06-15 DIAGNOSIS — K219 Gastro-esophageal reflux disease without esophagitis: Secondary | ICD-10-CM | POA: Diagnosis not present

## 2017-06-15 DIAGNOSIS — F419 Anxiety disorder, unspecified: Secondary | ICD-10-CM | POA: Diagnosis not present

## 2017-06-15 DIAGNOSIS — I10 Essential (primary) hypertension: Secondary | ICD-10-CM | POA: Diagnosis not present

## 2017-06-15 DIAGNOSIS — I25810 Atherosclerosis of coronary artery bypass graft(s) without angina pectoris: Secondary | ICD-10-CM | POA: Diagnosis not present

## 2017-06-15 DIAGNOSIS — N401 Enlarged prostate with lower urinary tract symptoms: Secondary | ICD-10-CM | POA: Diagnosis not present

## 2017-06-15 DIAGNOSIS — R001 Bradycardia, unspecified: Secondary | ICD-10-CM | POA: Diagnosis not present

## 2017-06-15 DIAGNOSIS — E782 Mixed hyperlipidemia: Secondary | ICD-10-CM | POA: Diagnosis not present

## 2017-06-15 DIAGNOSIS — J449 Chronic obstructive pulmonary disease, unspecified: Secondary | ICD-10-CM | POA: Diagnosis not present

## 2017-06-15 DIAGNOSIS — R7301 Impaired fasting glucose: Secondary | ICD-10-CM | POA: Diagnosis not present

## 2017-06-15 DIAGNOSIS — M25562 Pain in left knee: Secondary | ICD-10-CM | POA: Diagnosis not present

## 2017-06-30 ENCOUNTER — Other Ambulatory Visit: Payer: Self-pay | Admitting: Cardiology

## 2017-07-05 ENCOUNTER — Other Ambulatory Visit: Payer: Self-pay | Admitting: *Deleted

## 2017-07-05 DIAGNOSIS — I119 Hypertensive heart disease without heart failure: Secondary | ICD-10-CM

## 2017-07-05 MED ORDER — LOSARTAN POTASSIUM 100 MG PO TABS: 50.0000 mg | ORAL_TABLET | Freq: Every day | ORAL | 1 refills | Status: DC

## 2017-07-21 DIAGNOSIS — R7301 Impaired fasting glucose: Secondary | ICD-10-CM | POA: Diagnosis not present

## 2017-07-21 DIAGNOSIS — I25810 Atherosclerosis of coronary artery bypass graft(s) without angina pectoris: Secondary | ICD-10-CM | POA: Diagnosis not present

## 2017-07-21 DIAGNOSIS — Z6830 Body mass index (BMI) 30.0-30.9, adult: Secondary | ICD-10-CM | POA: Diagnosis not present

## 2017-07-21 DIAGNOSIS — I480 Paroxysmal atrial fibrillation: Secondary | ICD-10-CM | POA: Diagnosis not present

## 2017-07-21 DIAGNOSIS — I6523 Occlusion and stenosis of bilateral carotid arteries: Secondary | ICD-10-CM | POA: Diagnosis not present

## 2017-07-21 DIAGNOSIS — J449 Chronic obstructive pulmonary disease, unspecified: Secondary | ICD-10-CM | POA: Diagnosis not present

## 2017-07-21 DIAGNOSIS — Z8673 Personal history of transient ischemic attack (TIA), and cerebral infarction without residual deficits: Secondary | ICD-10-CM | POA: Diagnosis not present

## 2017-07-21 DIAGNOSIS — R42 Dizziness and giddiness: Secondary | ICD-10-CM | POA: Diagnosis not present

## 2017-07-21 DIAGNOSIS — E782 Mixed hyperlipidemia: Secondary | ICD-10-CM | POA: Diagnosis not present

## 2017-07-21 DIAGNOSIS — K219 Gastro-esophageal reflux disease without esophagitis: Secondary | ICD-10-CM | POA: Diagnosis not present

## 2017-07-21 DIAGNOSIS — M25562 Pain in left knee: Secondary | ICD-10-CM | POA: Diagnosis not present

## 2017-07-21 DIAGNOSIS — F419 Anxiety disorder, unspecified: Secondary | ICD-10-CM | POA: Diagnosis not present

## 2017-08-22 NOTE — Progress Notes (Signed)
Cardiology Office Note  Date: 08/23/2017   ID: William Elliott, DOB Jan 10, 1941, MRN [id number]  PCP: [name], MD  Primary Cardiologist: [name], MD   Chief Complaint  Patient presents with  . Coronary Artery Disease    History of Present Illness: William Elliott is a medically complex 77 y.o. male last seen in December 2018.  He presents for a routine follow-up visit.  He does not report any angina symptoms at this time and has stable NYHA class II dyspnea.  No palpitations or syncope.  At the last visit he was taken off Zebeta due to slow ventricular response in atrial fibrillation.  He had stopped taking Xarelto as well due to reported recurrent hematochezia.  He was placed on low-dose aspirin.  Follow-up hemoglobin was however found to be normal at 14.1, platelets normal at 229.  We discussed the matter today, he does not want to go back on anticoagulation, states that his hematochezia has gotten significantly better.  We did talk about other alternatives, but he wants to stay on aspirin at this point.  Last carotid Dopplers were in 2017.  Past Medical History:  Diagnosis Date  . Acute lower GI bleeding 2008  . Anxiety   . Asthma   . Carotid artery disease ([place])    Asymptomatic left carotid bruit  . COPD (chronic obstructive pulmonary disease) ([place])   . Coronary artery disease    a. CABG - 1999 with LIMA-LAD, SVG-DIAG, SVG-OM, SVG-RPDA  b. Cath in setting of NSTEMI 03/18/2015: patent LIMA-LAD, occluded SVG-RCA, occluded SVG-D1, 99% stenosis of SVG-OM treated w/ DES  . Depression   . Dyslipidemia   . Erectile dysfunction   . Essential hypertension   . History of blood transfusion 2008  . History of stroke   . Hyperlipidemia   . Myocardial infarction [facility])     Past Surgical History:  Procedure Laterality Date  . CARDIAC CATHETERIZATION    . CARDIAC CATHETERIZATION N/A 03/18/2015   Procedure: Left Heart Cath and Cors/Grafts Angiography;  Surgeon: Leonie Man, MD;  Location: [place] [facility];  Service: Cardiovascular;  Laterality: N/A;  . CARDIAC CATHETERIZATION N/A 03/18/2015   Procedure: Coronary Stent Intervention;  Surgeon: Leonie Man, MD;  Location: [place] [facility];  Service: Cardiovascular;  Laterality: N/A;  . CATARACT EXTRACTION W/ INTRAOCULAR LENS  IMPLANT, BILATERAL Bilateral   . CORONARY ANGIOPLASTY    . CORONARY ARTERY BYPASS GRAFT  1999  . FRACTURE SURGERY    . HERNIA REPAIR    . [facility]; took bone out of my right hip  . TONSILLECTOMY    . UMBILICAL HERNIA REPAIR  ~ 2000    Current Outpatient Medications  Medication Sig Dispense Refill  . Albuterol Sulfate 108 (90 BASE) MCG/ACT AEPB Inhale 2 puffs into the lungs 4 (four) times daily as needed (wheezing and sob).    . ALPRAZolam (XANAX) 1 MG tablet Take 1 tablet (1 mg total) by mouth at bedtime as needed for anxiety. 10 tablet 0  . amLODipine (NORVASC) 5 MG tablet TAKE ONE TABLET BY MOUTH EVERY DAY 90 tablet 0  . aspirin EC 81 MG tablet Take 1 tablet (81 mg total) by mouth daily. 90 tablet 3  . b complex vitamins tablet Take 1 tablet by mouth daily.    Marland Kitchen ezetimibe (ZETIA) 10 MG tablet Take 1 tablet (10 mg total) daily by mouth. 90 tablet 3  .  FISH OIL-KRILL OIL PO Take 1 capsule by mouth daily.    . hydrochlorothiazide (HYDRODIURIL) 25 MG tablet TAKE 1 TABLET BY MOUTH EVERY DAY 90 tablet 3  . isosorbide mononitrate (IMDUR) 30 MG 24 hr tablet TAKE 1 TABLET BY MOUTH EVERY DAY 30 tablet 6  . losartan (COZAAR) 100 MG tablet Take 0.5 tablets (50 mg total) by mouth daily. 45 tablet 1  . multivitamin (THERAGRAN) per tablet Take 1 tablet by mouth daily.      . nitroGLYCERIN (NITROSTAT) 0.4 MG SL tablet Place 1 tablet (0.4 mg total) under the tongue every 5 (five) minutes as needed for chest pain. 25 tablet 3  . Omega-3 Fatty Acids (FISH OIL PO) Take 1 capsule by mouth 2 (two) times daily.    . pantoprazole (PROTONIX) 40 MG  tablet TAKE 1 TABLET BY MOUTH DAILY 30 tablet 6  . potassium chloride SA (K-DUR,KLOR-CON) 20 MEQ tablet TAKE 1 TABLET BY MOUTH TWICE DAILY 180 tablet 3   No current facility-administered medications for this visit.    Allergies:  Anoro ellipta [umeclidinium-vilanterol]; Stiolto respimat [tiotropium bromide-olodaterol]; Fenofibrate; Lipitor [atorvastatin calcium]; Niaspan [niacin]; and Zocor [simvastatin]   Social History: The patient  reports that he quit smoking about 20 years ago. His smoking use included cigars. He quit after 10.00 years of use. He has never used smokeless tobacco. He reports that he does not drink alcohol or use drugs.   ROS:  Please see the history of present illness. Otherwise, complete review of systems is positive for hearing loss.  All other systems are reviewed and negative.   Physical Exam: VS:  BP 120/67   Pulse (!) 58   Ht 5\' 10"  (1.778 m)   Wt 204 lb (92.5 kg)   SpO2 97%   BMI 29.27 kg/m , BMI Body mass index is 29.27 kg/m.  Wt Readings from Last 3 Encounters:  08/23/17 204 lb (92.5 kg)  02/12/17 215 lb (97.5 kg)  11/01/16 195 lb (88.5 kg)    General: Elderly male, appears comfortable at rest. HEENT: Conjunctiva and lids normal, oropharynx clear. Neck: Supple, no elevated JVP or carotid bruits, no thyromegaly. Lungs: Clear to auscultation, nonlabored breathing at rest. Cardiac: Irregularly irregular, no S3, soft systolic murmur. Abdomen: Soft, nontender, bowel sounds present. Extremities: Trace ankle edema, distal pulses 2+. Skin: Warm and dry. Musculoskeletal: No kyphosis. Neuropsychiatric: Alert and oriented x3, affect grossly appropriate.  ECG: I personally reviewed the tracing from 02/12/2017 which shows slow atrial fibrillation at 43 bpm.  Recent Labwork:    Component Value Date/Time   CHOL 176 12/09/2015 0616   TRIG 157 (H) 12/09/2015 0616   HDL 30 (L) 12/09/2015 0616   CHOLHDL 5.9 12/09/2015 0616   VLDL 31 12/09/2015 0616   LDLCALC  115 (H) 12/09/2015 0616   LDLDIRECT 117.0 04/20/2014 1303  December 2018: Hemoglobin 14.1, platelets 229  Other Studies Reviewed Today:  Echocardiogram 12/09/2015: Study Conclusions  - Left ventricle: The cavity size was normal. There was severe concentric hypertrophy. Systolic function was vigorous. The estimated ejection fraction was in the range of 65% to 70%. Wall motion was normal; there were no regional wall motion abnormalities. The study was not technically sufficient to allow evaluation of LV diastolic dysfunction due to atrial fibrillation. - Aortic valve: Trileaflet; mildly thickened, mildly calcified leaflets. Transvalvular velocity was minimally increased. There was no stenosis. There was mild regurgitation. - Ascending aorta: The ascending aorta was normal in size. - Mitral valve: Mildly thickened leaflets . There was  mild regurgitation. - Left atrium: The atrium was mildly dilated. - Right ventricle: Systolic function was normal. - Right atrium: The atrium was normal in size. - Tricuspid valve: Structurally normal valve. There was trivial regurgitation. - Pulmonic valve: Structurally normal valve. - Inferior vena cava: The vessel was normal in size. - Pericardium, extracardiac: There was no pericardial effusion.  Cardiac catheterization 03/18/2015:    Conclusion   1. Prox LAD to Mid LAD lesion, 95% stenosed. Mid LAD lesion, 100% stenosed. Ost 2nd Diag to 2nd Diag lesion, 80% stenosed. 2. Ost Cx to Prox Cx lesion, 100% stenosed. 3. Mid RCA to Dist RCA lesion, 100% stenosed. Post Atrio lesion, 100% stenosed. 4. LIMA was injected is large. JR4 Catheter used 5. SVG-RCA & SVG-DIAG 100% occluded 6. Prox Graft lesion SVG-OM, 99% stenosed. Very large - TIMI 1 flow to graft insertion, but no flow in the OM. 7. Following PCI of SVG-OM with distal protection device using Synergy DES, Post intervention, there is a 0% residual stenosis. 8. Mid Cx to  Dist Cx lesion, 65% stenosed - not identified until post PCI   Overall severe native coronary artery disease with occluded native mid LAD, proximal circumflex and mid RCA. Patent LIMA-LAD, but 2 of 3 vein grafts 100% occluded (SVG-RCA and SVG-diagonal). - 99% proximal stenosis of SVG-OM treated with DES stent. Initially there was concern basilar patient having history of Mallory-Weiss tear with severe GI bleed 8 years ago. I confirmed with Dr. Mare Ferrari, this should not be an issue being so far distant to that event. The plan is to use Plavix, but based on the length of the lesion, I felt it best to be using a drug-eluting stent Relatively difficult procedure due to the extent of thrombus and debris in the vessel with TIMI 1 flow pre-PCI.  Overall successful PCI of the SVG-OM1 and TIMI-3 flow.   Assessment and Plan:  1.  CAD status post CABG with graft disease and DES to the SVG to OM in 2017.  He reports no active angina and we will continue with medical therapy and observation.  2.  Persistent atrial fibrillation, heart rate has increased somewhat since stopping Zebeta.  As noted above, he does not want to resume anticoagulation with concerns about recurring hematochezia and history of hemorrhoids.  He would prefer to stay on aspirin at this time, although we did discuss other alternatives.  3.  Mixed hyperlipidemia, on Zetia and omega-3 supplements.  He has a statin intolerance.  Continues to follow with Dr. Nevada Crane.  4.  Carotid artery disease, mild by assessment in 2017.  Left carotid bruit more prominent.  Will obtain follow-up study prior to next visit.  Current medicines were reviewed with the patient today.  Disposition: Follow-up in 6 months.  Signed, Satira Sark, MD, [facility] 08/23/2017 2:29 PM    [place] [facility] [place], [place], [name] [address] Phone: [phone]; Fax: [phone]

## 2017-08-23 ENCOUNTER — Encounter: Payer: Self-pay | Admitting: Cardiology

## 2017-08-23 ENCOUNTER — Ambulatory Visit (INDEPENDENT_AMBULATORY_CARE_PROVIDER_SITE_OTHER): Payer: Medicare Other | Admitting: Cardiology

## 2017-08-23 VITALS — BP 120/67 | HR 58 | Ht 70.0 in | Wt 204.0 lb

## 2017-08-23 DIAGNOSIS — I481 Persistent atrial fibrillation: Secondary | ICD-10-CM

## 2017-08-23 DIAGNOSIS — E782 Mixed hyperlipidemia: Secondary | ICD-10-CM | POA: Diagnosis not present

## 2017-08-23 DIAGNOSIS — I4819 Other persistent atrial fibrillation: Secondary | ICD-10-CM

## 2017-08-23 DIAGNOSIS — I25119 Atherosclerotic heart disease of native coronary artery with unspecified angina pectoris: Secondary | ICD-10-CM

## 2017-08-23 DIAGNOSIS — I6523 Occlusion and stenosis of bilateral carotid arteries: Secondary | ICD-10-CM

## 2017-08-23 NOTE — Patient Instructions (Signed)
Medication Instructions:   Your physician recommends that you continue on your current medications as directed. Please refer to the Current Medication list given to you today.  Labwork:  NONE  Testing/Procedures: Your physician has requested that you have a carotid duplex in 6 months just before your next visit. This test is an ultrasound of the carotid arteries in your neck. It looks at blood flow through these arteries that supply the brain with blood. Allow one hour for this exam. There are no restrictions or special instructions.  Follow-Up:  Your physician recommends that you schedule a follow-up appointment in: 6 months. You will receive a reminder letter in the mail in about 4 months reminding you to call and schedule your appointment. If you don't receive this letter, please contact our office.  Any Other Special Instructions Will Be Listed Below (If Applicable).  If you need a refill on your cardiac medications before your next appointment, please call your pharmacy.

## 2017-08-29 ENCOUNTER — Other Ambulatory Visit: Payer: Self-pay | Admitting: Cardiology

## 2017-08-29 DIAGNOSIS — I119 Hypertensive heart disease without heart failure: Secondary | ICD-10-CM

## 2017-09-27 ENCOUNTER — Other Ambulatory Visit: Payer: Self-pay | Admitting: Cardiology

## 2017-09-28 DIAGNOSIS — H43813 Vitreous degeneration, bilateral: Secondary | ICD-10-CM | POA: Diagnosis not present

## 2017-09-28 DIAGNOSIS — H52202 Unspecified astigmatism, left eye: Secondary | ICD-10-CM | POA: Diagnosis not present

## 2017-09-28 DIAGNOSIS — H26493 Other secondary cataract, bilateral: Secondary | ICD-10-CM | POA: Diagnosis not present

## 2017-09-28 DIAGNOSIS — H0100A Unspecified blepharitis right eye, upper and lower eyelids: Secondary | ICD-10-CM | POA: Diagnosis not present

## 2017-10-27 ENCOUNTER — Other Ambulatory Visit: Payer: Self-pay | Admitting: Cardiology

## 2017-12-10 DIAGNOSIS — I10 Essential (primary) hypertension: Secondary | ICD-10-CM | POA: Diagnosis not present

## 2017-12-10 DIAGNOSIS — R7301 Impaired fasting glucose: Secondary | ICD-10-CM | POA: Diagnosis not present

## 2017-12-10 DIAGNOSIS — E785 Hyperlipidemia, unspecified: Secondary | ICD-10-CM | POA: Diagnosis not present

## 2017-12-10 DIAGNOSIS — N401 Enlarged prostate with lower urinary tract symptoms: Secondary | ICD-10-CM | POA: Diagnosis not present

## 2017-12-10 DIAGNOSIS — E782 Mixed hyperlipidemia: Secondary | ICD-10-CM | POA: Diagnosis not present

## 2017-12-15 DIAGNOSIS — Z8673 Personal history of transient ischemic attack (TIA), and cerebral infarction without residual deficits: Secondary | ICD-10-CM | POA: Diagnosis not present

## 2017-12-15 DIAGNOSIS — K226 Gastro-esophageal laceration-hemorrhage syndrome: Secondary | ICD-10-CM | POA: Diagnosis not present

## 2017-12-15 DIAGNOSIS — I10 Essential (primary) hypertension: Secondary | ICD-10-CM | POA: Diagnosis not present

## 2017-12-15 DIAGNOSIS — K219 Gastro-esophageal reflux disease without esophagitis: Secondary | ICD-10-CM | POA: Diagnosis not present

## 2017-12-15 DIAGNOSIS — I2510 Atherosclerotic heart disease of native coronary artery without angina pectoris: Secondary | ICD-10-CM | POA: Diagnosis not present

## 2017-12-15 DIAGNOSIS — F419 Anxiety disorder, unspecified: Secondary | ICD-10-CM | POA: Diagnosis not present

## 2017-12-15 DIAGNOSIS — E782 Mixed hyperlipidemia: Secondary | ICD-10-CM | POA: Diagnosis not present

## 2017-12-15 DIAGNOSIS — J449 Chronic obstructive pulmonary disease, unspecified: Secondary | ICD-10-CM | POA: Diagnosis not present

## 2017-12-15 DIAGNOSIS — I480 Paroxysmal atrial fibrillation: Secondary | ICD-10-CM | POA: Diagnosis not present

## 2017-12-15 DIAGNOSIS — Z0000 Encounter for general adult medical examination without abnormal findings: Secondary | ICD-10-CM | POA: Diagnosis not present

## 2017-12-15 DIAGNOSIS — I6523 Occlusion and stenosis of bilateral carotid arteries: Secondary | ICD-10-CM | POA: Diagnosis not present

## 2017-12-15 DIAGNOSIS — N401 Enlarged prostate with lower urinary tract symptoms: Secondary | ICD-10-CM | POA: Diagnosis not present

## 2017-12-20 ENCOUNTER — Other Ambulatory Visit: Payer: Self-pay | Admitting: Cardiology

## 2017-12-22 DIAGNOSIS — R195 Other fecal abnormalities: Secondary | ICD-10-CM | POA: Diagnosis not present

## 2018-02-17 ENCOUNTER — Ambulatory Visit (INDEPENDENT_AMBULATORY_CARE_PROVIDER_SITE_OTHER): Payer: Medicare Other

## 2018-02-17 DIAGNOSIS — I6523 Occlusion and stenosis of bilateral carotid arteries: Secondary | ICD-10-CM | POA: Diagnosis not present

## 2018-02-17 NOTE — Progress Notes (Signed)
Cardiology Office Note  Date: 02/18/2018   ID: William Elliott, DOB 1941-01-29, MRN [id number]  PCP: [name], MD  Primary Cardiologist: [name], MD   Chief Complaint  Patient presents with  . Coronary Artery Disease    History of Present Illness: William Elliott is a 77 y.o. male last seen in June.  He is here for a routine visit.  He does not report any angina symptoms or nitroglycerin use since last assessment.  He does not exercise regularly but remains functional with ADLs.  He reports NYHA class II dyspnea.  He continues to decline anticoagulation for atrial fibrillation with previous concerns about recurring hematochezia and hemorrhoids.  We took him off beta-blocker due to significant bradycardia, heart rate has been controlled without AV nodal blocker.  He is on aspirin.  Follow-up carotid Dopplers done yesterday showed mild bilateral ICA stenoses at 1 to 39%.  We discussed these results today.  He has a history of statin intolerance, taking Zetia.  I personally reviewed his ECG today which shows probable atypical atrial flutter versus atrial tachycardia with 4:1 block.  Past Medical History:  Diagnosis Date  . Acute lower GI bleeding 2008  . Anxiety   . Asthma   . Carotid artery disease ([place])    Asymptomatic left carotid bruit  . COPD (chronic obstructive pulmonary disease) ([place])   . Coronary artery disease    a. CABG - 1999 with LIMA-LAD, SVG-DIAG, SVG-OM, SVG-RPDA  b. Cath in setting of NSTEMI 03/18/2015: patent LIMA-LAD, occluded SVG-RCA, occluded SVG-D1, 99% stenosis of SVG-OM treated w/ DES  . Depression   . Dyslipidemia   . Erectile dysfunction   . Essential hypertension   . History of blood transfusion 2008  . History of stroke   . Hyperlipidemia   . Myocardial infarction [facility])     Past Surgical History:  Procedure Laterality Date  . CARDIAC CATHETERIZATION    . CARDIAC CATHETERIZATION N/A 03/18/2015   Procedure: Left Heart Cath and  Cors/Grafts Angiography;  Surgeon: Leonie Man, MD;  Location: [place] [facility];  Service: Cardiovascular;  Laterality: N/A;  . CARDIAC CATHETERIZATION N/A 03/18/2015   Procedure: Coronary Stent Intervention;  Surgeon: Leonie Man, MD;  Location: [place] [facility];  Service: Cardiovascular;  Laterality: N/A;  . CATARACT EXTRACTION W/ INTRAOCULAR LENS  IMPLANT, BILATERAL Bilateral   . CORONARY ANGIOPLASTY    . CORONARY ARTERY BYPASS GRAFT  1999  . FRACTURE SURGERY    . HERNIA REPAIR    . [facility]; took bone out of my right hip  . TONSILLECTOMY    . UMBILICAL HERNIA REPAIR  ~ 2000    Current Outpatient Medications  Medication Sig Dispense Refill  . Albuterol Sulfate 108 (90 BASE) MCG/ACT AEPB Inhale 2 puffs into the lungs 4 (four) times daily as needed (wheezing and sob).    . ALPRAZolam (XANAX) 1 MG tablet Take 1 tablet (1 mg total) by mouth at bedtime as needed for anxiety. 10 tablet 0  . amLODipine (NORVASC) 5 MG tablet TAKE 1 TABLET BY MOUTH EVERY DAY 90 tablet 1  . aspirin EC 81 MG tablet Take 1 tablet (81 mg total) by mouth daily. 90 tablet 3  . b complex vitamins tablet Take 1 tablet by mouth daily.    . hydrochlorothiazide (HYDRODIURIL) 25 MG tablet TAKE 1 TABLET BY MOUTH EVERY DAY 90 tablet 1  . isosorbide mononitrate (IMDUR) 30  MG 24 hr tablet TAKE 1 TABLET BY MOUTH EVERY DAY 30 tablet 6  . losartan (COZAAR) 100 MG tablet Take 0.5 tablets (50 mg total) by mouth daily. 45 tablet 1  . multivitamin (THERAGRAN) per tablet Take 1 tablet by mouth daily.      . nitroGLYCERIN (NITROSTAT) 0.4 MG SL tablet Place 1 tablet (0.4 mg total) under the tongue every 5 (five) minutes as needed for chest pain. 25 tablet 3  . pantoprazole (PROTONIX) 40 MG tablet TAKE 1 TABLET BY MOUTH EVERY DAY 30 tablet 6  . potassium chloride SA (K-DUR,KLOR-CON) 20 MEQ tablet TAKE 1 TABLET BY MOUTH TWICE DAILY 180 tablet 1  . Tiotropium Bromide-Olodaterol  (STIOLTO RESPIMAT) 2.5-2.5 MCG/ACT AERS Inhale 2 puffs into the lungs every morning.    Marland Kitchen ZETIA 10 MG tablet TAKE ONE TABLET BY MOUTH DAILY 90 tablet 3   No current facility-administered medications for this visit.    Allergies:  Anoro ellipta [umeclidinium-vilanterol]; Stiolto respimat [tiotropium bromide-olodaterol]; Fenofibrate; Lipitor [atorvastatin calcium]; Niaspan [niacin]; and Zocor [simvastatin]   Social History: The patient  reports that he quit smoking about 20 years ago. His smoking use included cigars. He quit after 10.00 years of use. He has never used smokeless tobacco. He reports that he does not drink alcohol or use drugs.   ROS:  Please see the history of present illness. Otherwise, complete review of systems is positive for hearing loss, arthritic stiffness.  All other systems are reviewed and negative.   Physical Exam: VS:  BP (!) 164/74   Pulse 60   Ht 5\' 10"  (1.778 m)   Wt 209 lb 3.2 oz (94.9 kg)   SpO2 95%   BMI 30.02 kg/m , BMI Body mass index is 30.02 kg/m.  Wt Readings from Last 3 Encounters:  02/18/18 209 lb 3.2 oz (94.9 kg)  08/23/17 204 lb (92.5 kg)  02/12/17 215 lb (97.5 kg)    General: Elderly male, appears comfortable at rest. HEENT: Conjunctiva and lids normal, oropharynx clear. Neck: Supple, no elevated JVP, left carotid bruit, no thyromegaly. Lungs: Clear to auscultation, nonlabored breathing at rest. Cardiac: Irregularly irregular, no S3, soft systolic murmur. Abdomen: Soft, nontender, bowel sounds present. Extremities: Stable, trace ankle edema, distal pulses 2+. Skin: Warm and dry. Musculoskeletal: No kyphosis. Neuropsychiatric: Alert and oriented x3, affect grossly appropriate.  ECG: I personally reviewed the tracing from 02/12/2017 which showed atrial fibrillation with slow ventricular response.  Recent Labwork:  October 2019: Hemoglobin 15.0, platelets 234, BUN 10, creatinine 1.09, potassium 4.1, AST 26, ALT 26, cholesterol 192,  triglycerides 111, HDL 38, LDL 132, hemoglobin A1c 5.7  Other Studies Reviewed Today:  Echocardiogram 12/09/2015: Study Conclusions  - Left ventricle: The cavity size was normal. There was severe concentric hypertrophy. Systolic function was vigorous. The estimated ejection fraction was in the range of 65% to 70%. Wall motion was normal; there were no regional wall motion abnormalities. The study was not technically sufficient to allow evaluation of LV diastolic dysfunction due to atrial fibrillation. - Aortic valve: Trileaflet; mildly thickened, mildly calcified leaflets. Transvalvular velocity was minimally increased. There was no stenosis. There was mild regurgitation. - Ascending aorta: The ascending aorta was normal in size. - Mitral valve: Mildly thickened leaflets . There was mild regurgitation. - Left atrium: The atrium was mildly dilated. - Right ventricle: Systolic function was normal. - Right atrium: The atrium was normal in size. - Tricuspid valve: Structurally normal valve. There was trivial regurgitation. - Pulmonic valve: Structurally normal valve. -  Inferior vena cava: The vessel was normal in size. - Pericardium, extracardiac: There was no pericardial effusion.  Cardiac catheterization 03/18/2015:    Conclusion   1. Prox LAD to Mid LAD lesion, 95% stenosed. Mid LAD lesion, 100% stenosed. Ost 2nd Diag to 2nd Diag lesion, 80% stenosed. 2. Ost Cx to Prox Cx lesion, 100% stenosed. 3. Mid RCA to Dist RCA lesion, 100% stenosed. Post Atrio lesion, 100% stenosed. 4. LIMA was injected is large. JR4 Catheter used 5. SVG-RCA & SVG-DIAG 100% occluded 6. Prox Graft lesion SVG-OM, 99% stenosed. Very large - TIMI 1 flow to graft insertion, but no flow in the OM. 7. Following PCI of SVG-OM with distal protection device using Synergy DES, Post intervention, there is a 0% residual stenosis. 8. Mid Cx to Dist Cx lesion, 65% stenosed - not identified until  post PCI   Overall severe native coronary artery disease with occluded native mid LAD, proximal circumflex and mid RCA. Patent LIMA-LAD, but 2 of 3 vein grafts 100% occluded (SVG-RCA and SVG-diagonal). - 99% proximal stenosis of SVG-OM treated with DES stent. Initially there was concern basilar patient having history of Mallory-Weiss tear with severe GI bleed 8 years ago. I confirmed with Dr. Mare Ferrari, this should not be an issue being so far distant to that event. The plan is to use Plavix, but based on the length of the lesion, I felt it best to be using a drug-eluting stent Relatively difficult procedure due to the extent of thrombus and debris in the vessel with TIMI 1 flow pre-PCI.  Overall successful PCI of the SVG-OM1 and TIMI-3 flow.   Assessment and Plan:  1.  Multivessel CAD status post CABG with documented graft disease and history of DES to the SVG to OM in 2017.  He continues to do well without active angina symptoms on medical therapy at current level of activity.  No changes were made today.  Refill provided for fresh bottle of nitroglycerin.  2.  Permanent atrial fibrillation/flutter.  No longer requiring AV nodal blocker for heart rate control, previously was bradycardic.  He declines anticoagulation as outlined above but remains on aspirin.  3.  Mild bilateral ICA disease by recent follow-up Dopplers.  Asymptomatic.  4.  Mixed hyperlipidemia with statin intolerance.  He continues on Zetia, declines other interventions.  Current medicines were reviewed with the patient today.   Orders Placed This Encounter  Procedures  . EKG 12-Lead    Disposition: Follow-up in 6 months.  Signed, Satira Sark, MD, [facility] 02/18/2018 8:54 AM    [facility], [place], [place] [address] Phone: [phone]; Fax: [phone]

## 2018-02-18 ENCOUNTER — Encounter: Payer: Self-pay | Admitting: Cardiology

## 2018-02-18 ENCOUNTER — Ambulatory Visit (INDEPENDENT_AMBULATORY_CARE_PROVIDER_SITE_OTHER): Payer: Medicare Other | Admitting: Cardiology

## 2018-02-18 VITALS — BP 164/74 | HR 60 | Ht 70.0 in | Wt 209.2 lb

## 2018-02-18 DIAGNOSIS — I25119 Atherosclerotic heart disease of native coronary artery with unspecified angina pectoris: Secondary | ICD-10-CM

## 2018-02-18 DIAGNOSIS — I4821 Permanent atrial fibrillation: Secondary | ICD-10-CM | POA: Diagnosis not present

## 2018-02-18 DIAGNOSIS — E782 Mixed hyperlipidemia: Secondary | ICD-10-CM

## 2018-02-18 DIAGNOSIS — I6523 Occlusion and stenosis of bilateral carotid arteries: Secondary | ICD-10-CM

## 2018-02-18 MED ORDER — NITROGLYCERIN 0.4 MG SL SUBL: 0.4000 mg | SUBLINGUAL_TABLET | SUBLINGUAL | 3 refills | Status: DC | PRN

## 2018-02-18 NOTE — Patient Instructions (Addendum)

## 2018-02-27 DIAGNOSIS — J069 Acute upper respiratory infection, unspecified: Secondary | ICD-10-CM | POA: Diagnosis not present

## 2018-02-27 DIAGNOSIS — R52 Pain, unspecified: Secondary | ICD-10-CM | POA: Diagnosis not present

## 2018-03-22 ENCOUNTER — Other Ambulatory Visit: Payer: Self-pay | Admitting: Cardiology

## 2018-03-22 DIAGNOSIS — I119 Hypertensive heart disease without heart failure: Secondary | ICD-10-CM

## 2018-06-09 DIAGNOSIS — I10 Essential (primary) hypertension: Secondary | ICD-10-CM | POA: Diagnosis not present

## 2018-06-09 DIAGNOSIS — E785 Hyperlipidemia, unspecified: Secondary | ICD-10-CM | POA: Diagnosis not present

## 2018-06-09 DIAGNOSIS — R7301 Impaired fasting glucose: Secondary | ICD-10-CM | POA: Diagnosis not present

## 2018-06-09 DIAGNOSIS — E782 Mixed hyperlipidemia: Secondary | ICD-10-CM | POA: Diagnosis not present

## 2018-06-16 DIAGNOSIS — I480 Paroxysmal atrial fibrillation: Secondary | ICD-10-CM | POA: Diagnosis not present

## 2018-06-16 DIAGNOSIS — I25810 Atherosclerosis of coronary artery bypass graft(s) without angina pectoris: Secondary | ICD-10-CM | POA: Diagnosis not present

## 2018-06-16 DIAGNOSIS — E782 Mixed hyperlipidemia: Secondary | ICD-10-CM | POA: Diagnosis not present

## 2018-06-16 DIAGNOSIS — Z8673 Personal history of transient ischemic attack (TIA), and cerebral infarction without residual deficits: Secondary | ICD-10-CM | POA: Diagnosis not present

## 2018-06-16 DIAGNOSIS — R7301 Impaired fasting glucose: Secondary | ICD-10-CM | POA: Diagnosis not present

## 2018-06-16 DIAGNOSIS — I651 Occlusion and stenosis of basilar artery: Secondary | ICD-10-CM | POA: Diagnosis not present

## 2018-06-16 DIAGNOSIS — K219 Gastro-esophageal reflux disease without esophagitis: Secondary | ICD-10-CM | POA: Diagnosis not present

## 2018-06-16 DIAGNOSIS — J449 Chronic obstructive pulmonary disease, unspecified: Secondary | ICD-10-CM | POA: Diagnosis not present

## 2018-06-16 DIAGNOSIS — I6523 Occlusion and stenosis of bilateral carotid arteries: Secondary | ICD-10-CM | POA: Diagnosis not present

## 2018-06-16 DIAGNOSIS — N401 Enlarged prostate with lower urinary tract symptoms: Secondary | ICD-10-CM | POA: Diagnosis not present

## 2018-06-16 DIAGNOSIS — F419 Anxiety disorder, unspecified: Secondary | ICD-10-CM | POA: Diagnosis not present

## 2018-06-16 DIAGNOSIS — I10 Essential (primary) hypertension: Secondary | ICD-10-CM | POA: Diagnosis not present

## 2018-06-29 DIAGNOSIS — E785 Hyperlipidemia, unspecified: Secondary | ICD-10-CM | POA: Diagnosis not present

## 2018-06-29 DIAGNOSIS — J449 Chronic obstructive pulmonary disease, unspecified: Secondary | ICD-10-CM | POA: Diagnosis not present

## 2018-06-29 DIAGNOSIS — I25810 Atherosclerosis of coronary artery bypass graft(s) without angina pectoris: Secondary | ICD-10-CM | POA: Diagnosis not present

## 2018-06-29 DIAGNOSIS — F419 Anxiety disorder, unspecified: Secondary | ICD-10-CM | POA: Diagnosis not present

## 2018-06-29 DIAGNOSIS — I480 Paroxysmal atrial fibrillation: Secondary | ICD-10-CM | POA: Diagnosis not present

## 2018-06-29 DIAGNOSIS — K219 Gastro-esophageal reflux disease without esophagitis: Secondary | ICD-10-CM | POA: Diagnosis not present

## 2018-06-29 DIAGNOSIS — Z8673 Personal history of transient ischemic attack (TIA), and cerebral infarction without residual deficits: Secondary | ICD-10-CM | POA: Diagnosis not present

## 2018-06-29 DIAGNOSIS — I10 Essential (primary) hypertension: Secondary | ICD-10-CM | POA: Diagnosis not present

## 2018-06-29 DIAGNOSIS — E782 Mixed hyperlipidemia: Secondary | ICD-10-CM | POA: Diagnosis not present

## 2018-06-29 DIAGNOSIS — I6523 Occlusion and stenosis of bilateral carotid arteries: Secondary | ICD-10-CM | POA: Diagnosis not present

## 2018-06-29 DIAGNOSIS — R7301 Impaired fasting glucose: Secondary | ICD-10-CM | POA: Diagnosis not present

## 2018-07-17 ENCOUNTER — Other Ambulatory Visit: Payer: Self-pay | Admitting: Cardiology

## 2018-07-17 DIAGNOSIS — I119 Hypertensive heart disease without heart failure: Secondary | ICD-10-CM

## 2018-08-16 ENCOUNTER — Other Ambulatory Visit: Payer: Self-pay | Admitting: Cardiology

## 2018-08-19 ENCOUNTER — Encounter: Payer: Self-pay | Admitting: Cardiology

## 2018-08-19 ENCOUNTER — Telehealth (INDEPENDENT_AMBULATORY_CARE_PROVIDER_SITE_OTHER): Payer: Medicare Other | Admitting: Cardiology

## 2018-08-19 ENCOUNTER — Telehealth: Payer: Self-pay | Admitting: Cardiology

## 2018-08-19 VITALS — Ht 70.0 in

## 2018-08-19 DIAGNOSIS — Z7189 Other specified counseling: Secondary | ICD-10-CM

## 2018-08-19 DIAGNOSIS — I6523 Occlusion and stenosis of bilateral carotid arteries: Secondary | ICD-10-CM | POA: Diagnosis not present

## 2018-08-19 DIAGNOSIS — I4821 Permanent atrial fibrillation: Secondary | ICD-10-CM

## 2018-08-19 DIAGNOSIS — I25119 Atherosclerotic heart disease of native coronary artery with unspecified angina pectoris: Secondary | ICD-10-CM

## 2018-08-19 DIAGNOSIS — I10 Essential (primary) hypertension: Secondary | ICD-10-CM

## 2018-08-19 DIAGNOSIS — E782 Mixed hyperlipidemia: Secondary | ICD-10-CM

## 2018-08-19 NOTE — Telephone Encounter (Signed)
Virtual Visit Pre-Appointment Phone Call  "(Name), I am calling you today to discuss your upcoming appointment. We are currently trying to limit exposure to the virus that causes COVID-19 by seeing patients at home rather than in the office."  "What is the BEST phone number to call the day of the visit?" -    [phone] 1. Do you have or have access to (through a family member/friend) a smartphone with video capability that we can use for your visit?" a. If yes - list this number in appt notes as cell (if different from BEST phone #) and list the appointment type as a VIDEO visit in appointment notes b. If no - list the appointment type as a PHONE visit in appointment notes  2. Confirm consent - "In the setting of the current Covid19 crisis, you are scheduled for a (phone or video) visit with your provider on (date) at (time).  Just as we do with many in-office visits, in order for you to participate in this visit, we must obtain consent.  If you'd like, I can send this to your mychart (if signed up) or email for you to review.  Otherwise, I can obtain your verbal consent now.  All virtual visits are billed to your insurance company just like a normal visit would be.  By agreeing to a virtual visit, we'd like you to understand that the technology does not allow for your provider to perform an examination, and thus may limit your provider's ability to fully assess your condition. If your provider identifies any concerns that need to be evaluated in person, we will make arrangements to do so.  Finally, though the technology is pretty good, we cannot assure that it will always work on either your or our end, and in the setting of a video visit, we may have to convert it to a phone-only visit.  In either situation, we cannot ensure that we have a secure connection.  Are you willing to proceed?" STAFF: Did the patient verbally acknowledge consent to telehealth visit? Document YES/NO here:  YES  3. Advise patient to be prepared - "Two hours prior to your appointment, go ahead and check your blood pressure, pulse, oxygen saturation, and your weight (if you have the equipment to check those) and write them all down. When your visit starts, your provider will ask you for this information. If you have an Apple Watch or Kardia device, please plan to have heart rate information ready on the day of your appointment. Please have a pen and paper handy nearby the day of the visit as well."  4. Give patient instructions for MyChart download to smartphone OR [email or web address] as below if video visit (depending on what platform provider is using)  5. Inform patient they will receive a phone call 15 minutes prior to their appointment time (may be from unknown caller ID) so they should be prepared to answer    TELEPHONE CALL NOTE  William Elliott has been deemed a candidate for a follow-up tele-health visit to limit community exposure during the Covid-19 pandemic. I spoke with the patient via phone to ensure availability of phone/video source, confirm preferred email & phone number, and discuss instructions and expectations.  I reminded William Elliott to be prepared with any vital sign and/or heart rhythm information that could potentially be obtained via home monitoring, at the time of his visit. I reminded William Elliott to expect a phone call prior to his visit.  Chanda Busing 08/19/2018 12:46 PM   INSTRUCTIONS FOR DOWNLOADING THE MYCHART APP TO SMARTPHONE  - The patient must first make sure to have activated MyChart and know their login information - If Apple, go to [employer] and type in MyChart in the search bar and download the app. If Android, ask patient to go to [employer] and type in [place] in the search bar and download the app. The app is free but as with any other app downloads, their phone may require them to verify saved payment information or Apple/Android password.   - The patient will need to then log into the app with their MyChart username and password, and select [facility] as their healthcare provider to link the account. When it is time for your visit, go to the MyChart app, find appointments, and click Begin Video Visit. Be sure to Select Allow for your device to access the Microphone and Camera for your visit. You will then be connected, and your provider will be with you shortly.  **If they have any issues connecting, or need assistance please contact MyChart service desk [phone])**  **If using a computer, in order to ensure the best quality for their visit they will need to use either of the following Internet Browsers: [employer], or Google Chrome**  IF USING DOXIMITY or DOXY.ME - The patient will receive a link just prior to their visit by text.     FULL LENGTH CONSENT FOR TELE-HEALTH VISIT   I hereby voluntarily request, consent and authorize [facility] and its employed or contracted physicians, physician assistants, nurse practitioners or other licensed health care professionals (the Practitioner), to provide me with telemedicine health care services (the Services") as deemed necessary by the treating Practitioner. I acknowledge and consent to receive the Services by the Practitioner via telemedicine. I understand that the telemedicine visit will involve communicating with the Practitioner through live audiovisual communication technology and the disclosure of certain medical information by electronic transmission. I acknowledge that I have been given the opportunity to request an in-person assessment or other available alternative prior to the telemedicine visit and am voluntarily participating in the telemedicine visit.  I understand that I have the right to withhold or withdraw my consent to the use of telemedicine in the course of my care at any time, without affecting my right to future care or treatment, and that  the Practitioner or I may terminate the telemedicine visit at any time. I understand that I have the right to inspect all information obtained and/or recorded in the course of the telemedicine visit and may receive copies of available information for a reasonable fee.  I understand that some of the potential risks of receiving the Services via telemedicine include:   Delay or interruption in medical evaluation due to technological equipment failure or disruption;  Information transmitted may not be sufficient (e.g. poor resolution of images) to allow for appropriate medical decision making by the Practitioner; and/or   In rare instances, security protocols could fail, causing a breach of personal health information.  Furthermore, I acknowledge that it is my responsibility to provide information about my medical history, conditions and care that is complete and accurate to the best of my ability. I acknowledge that Practitioner's advice, recommendations, and/or decision may be based on factors not within their control, such as incomplete or inaccurate data provided by me or distortions of diagnostic images or specimens that may result from electronic transmissions. I understand that the  practice of medicine is not an Chief Strategy Officer and that Practitioner makes no warranties or guarantees regarding treatment outcomes. I acknowledge that I will receive a copy of this consent concurrently upon execution via email to the email address I last provided but may also request a printed copy by calling the office of [place].    I understand that my insurance will be billed for this visit.   I have read or had this consent read to me.  I understand the contents of this consent, which adequately explains the benefits and risks of the Services being provided via telemedicine.   I have been provided ample opportunity to ask questions regarding this consent and the Services and have had my questions answered to  my satisfaction.  I give my informed consent for the services to be provided through the use of telemedicine in my medical care  By participating in this telemedicine visit I agree to the above.

## 2018-08-19 NOTE — Patient Instructions (Addendum)

## 2018-08-19 NOTE — Progress Notes (Signed)
Virtual Visit via Telephone Note   This visit type was conducted due to national recommendations for restrictions regarding the COVID-19 Pandemic (e.g. social distancing) in an effort to limit this patient's exposure and mitigate transmission in our community.  Due to his co-morbid illnesses, this patient is at least at moderate risk for complications without adequate follow up.  This format is felt to be most appropriate for this patient at this time.  The patient did not have access to video technology/had technical difficulties with video requiring transitioning to audio format only (telephone).  All issues noted in this document were discussed and addressed.  No physical exam could be performed with this format.  Please refer to the patient's chart for his  consent to telehealth for [facility].   Date:  08/19/2018   ID:  William Elliott, DOB 06-May-1940, MRN [id number]  Patient Location: Home Provider Location: Office  PCP:  [name], MD  Cardiologist:  [name], MD Electrophysiologist:  None   Evaluation Performed:  Follow-Up Visit  Chief Complaint:   Cardiac follow-up  History of Present Illness:    William Elliott is a 78 y.o. male last seen in December 2019.  He did not have video access and we spoke by phone today.  From a cardiac perspective, he does not report any angina symptoms or nitroglycerin use.  He has not been as active during the COVID-19 pandemic, staying around the house.  He has been hesitant to go out in public.  He continues to decline anticoagulation for atrial fibrillation with previous concerns about recurring hematochezia and hemorrhoids.  We took him off beta-blocker due to significant bradycardia, heart rate has been controlled without AV nodal blocker.  He is on aspirin.  I reviewed his recent lab work from April as outlined below.  We went over his medications which are stable from a cardiac perspective.  The patient does not have symptoms  concerning for COVID-19 infection (fever, chills, cough, or new shortness of breath).    Past Medical History:  Diagnosis Date  . Acute lower GI bleeding 2008  . Anxiety   . Asthma   . Carotid artery disease ([place])    Asymptomatic left carotid bruit  . COPD (chronic obstructive pulmonary disease) ([place])   . Coronary artery disease    a. CABG - 1999 with LIMA-LAD, SVG-DIAG, SVG-OM, SVG-RPDA  b. Cath in setting of NSTEMI 03/18/2015: patent LIMA-LAD, occluded SVG-RCA, occluded SVG-D1, 99% stenosis of SVG-OM treated w/ DES  . Depression   . Dyslipidemia   . Erectile dysfunction   . Essential hypertension   . History of blood transfusion 2008  . History of stroke   . Hyperlipidemia   . Myocardial infarction [facility])    Past Surgical History:  Procedure Laterality Date  . CARDIAC CATHETERIZATION    . CARDIAC CATHETERIZATION N/A 03/18/2015   Procedure: Left Heart Cath and Cors/Grafts Angiography;  Surgeon: Leonie Man, MD;  Location: [place] [facility];  Service: Cardiovascular;  Laterality: N/A;  . CARDIAC CATHETERIZATION N/A 03/18/2015   Procedure: Coronary Stent Intervention;  Surgeon: Leonie Man, MD;  Location: [place] [facility];  Service: Cardiovascular;  Laterality: N/A;  . CATARACT EXTRACTION W/ INTRAOCULAR LENS  IMPLANT, BILATERAL Bilateral   . CORONARY ANGIOPLASTY    . CORONARY ARTERY BYPASS GRAFT  1999  . FRACTURE SURGERY    . HERNIA REPAIR    . [facility]; took  bone out of my right hip  . TONSILLECTOMY    . UMBILICAL HERNIA REPAIR  ~ 2000     Current Meds  Medication Sig  . ALPRAZolam (XANAX) 1 MG tablet Take 1 tablet (1 mg total) by mouth at bedtime as needed for anxiety.  Marland Kitchen amLODipine (NORVASC) 5 MG tablet TAKE 1 TABLET BY MOUTH EVERY DAY  . aspirin EC 81 MG tablet Take 1 tablet (81 mg total) by mouth daily.  Marland Kitchen b complex vitamins tablet Take 1 tablet by mouth daily.  . hydrochlorothiazide (HYDRODIURIL) 25 MG tablet TAKE 1  TABLET BY MOUTH EVERY DAY  . isosorbide mononitrate (IMDUR) 30 MG 24 hr tablet TAKE 1 TABLET BY MOUTH EVERY DAY  . losartan (COZAAR) 100 MG tablet TAKE 1/2 TABLET BY MOUTH DAILY  . multivitamin (THERAGRAN) per tablet Take 1 tablet by mouth daily.    . nitroGLYCERIN (NITROSTAT) 0.4 MG SL tablet Place 1 tablet (0.4 mg total) under the tongue every 5 (five) minutes x 3 doses as needed for chest pain (if no relief after 3rd dose, proceed to the ED for an evaluation).  . pantoprazole (PROTONIX) 40 MG tablet TAKE 1 TABLET BY MOUTH EVERY DAY  . potassium chloride SA (K-DUR) 20 MEQ tablet TAKE 1 TABLET BY MOUTH TWICE DAILY  . Tiotropium Bromide-Olodaterol (STIOLTO RESPIMAT) 2.5-2.5 MCG/ACT AERS Inhale 1 puff into the lungs 2 (two) times a day.  . VENTOLIN HFA 108 (90 Base) MCG/ACT inhaler Inhale 2 puffs into the lungs 2 (two) times daily as needed.  Marland Kitchen ZETIA 10 MG tablet TAKE ONE TABLET BY MOUTH DAILY  . [DISCONTINUED] Albuterol Sulfate 108 (90 BASE) MCG/ACT AEPB Inhale 2 puffs into the lungs 4 (four) times daily as needed (wheezing and sob).     Allergies:   Anoro ellipta [umeclidinium-vilanterol], Fenofibrate, Lipitor [atorvastatin calcium], Niaspan [niacin], and Zocor [simvastatin]   Social History   Tobacco Use  . Smoking status: Former Smoker    Years: 10.00    Types: Cigars    Quit date: 03/02/1997    Years since quitting: 21.4  . Smokeless tobacco: Never Used  . Tobacco comment: smoked cigars x 10 yrs, never smoked cigarettes  Substance Use Topics  . Alcohol use: No    Alcohol/week: 0.0 standard drinks    Comment: "drank some when I was young; quit in the 1990s"  . Drug use: No     Family Hx: The patient's family history includes Heart attack in his father; Heart disease in his father; Hypertension in his mother; Stroke in his mother.  ROS:   Please see the history of present illness. All other systems reviewed and are negative.   Prior CV studies:   The following studies were  reviewed today:  Carotid Dopplers 02/17/2018: Summary: Right Carotid: Velocities in the right ICA are consistent with a 1-39% stenosis.                Non-hemodynamically significant plaque <50% noted in the CCA.  Left Carotid: Velocities in the left ICA are consistent with a 1-39% stenosis.               Non-hemodynamically significant plaque noted in the CCA. The ECA               appears >50% stenosed.  Vertebrals:  Bilateral vertebral arteries demonstrate antegrade flow. Subclavians: Normal flow hemodynamics were seen in bilateral subclavian              arteries.  Echocardiogram 12/09/2015:  Study Conclusions  - Left ventricle: The cavity size was normal. There was severe   concentric hypertrophy. Systolic function was vigorous. The   estimated ejection fraction was in the range of 65% to 70%. Wall   motion was normal; there were no regional wall motion   abnormalities. The study was not technically sufficient to allow   evaluation of LV diastolic dysfunction due to atrial   fibrillation. - Aortic valve: Trileaflet; mildly thickened, mildly calcified   leaflets. Transvalvular velocity was minimally increased. There   was no stenosis. There was mild regurgitation. - Ascending aorta: The ascending aorta was normal in size. - Mitral valve: Mildly thickened leaflets . There was mild   regurgitation. - Left atrium: The atrium was mildly dilated. - Right ventricle: Systolic function was normal. - Right atrium: The atrium was normal in size. - Tricuspid valve: Structurally normal valve. There was trivial   regurgitation. - Pulmonic valve: Structurally normal valve. - Inferior vena cava: The vessel was normal in size. - Pericardium, extracardiac: There was no pericardial effusion.  Labs/Other Tests and Data Reviewed:    EKG:  An ECG dated 02/18/2018 was personally reviewed today and demonstrated:  Probable atypical atrial flutter versus atrial tachycardia with 4:1 block.   Recent Labs:  April 2020: Hemoglobin 14.7, platelets 215, BUN 11, creatinine 1.12, potassium 4.5, AST 24, ALT 25, cholesterol 195, triglycerides 157, HDL 33, LDL 131, hemoglobin A1c 5.9%  Wt Readings from Last 3 Encounters:  02/18/18 209 lb 3.2 oz (94.9 kg)  08/23/17 204 lb (92.5 kg)  02/12/17 215 lb (97.5 kg)     Objective:    Vital Signs:  Ht 5\' 10"  (1.778 m)   BMI 30.02 kg/m    He did not have a way to check vital signs today. Patient spoke in full sentences, not short of breath. No audible wheezing or coughing. Speech pattern normal.  ASSESSMENT & PLAN:    1.  Multivessel CAD status post CABG with graft disease and DES to the SVG to OM in 2017.  He is tolerating medical therapy without active angina.  We will continue with observation for now.  2.  Permanent atrial fibrillation/flutter.  Presently not requiring any heart rate control medications.  He has declined anticoagulation as outlined above, remains on aspirin.  3.  Mild, asymptomatic bilateral ICA stenoses.  Continue aspirin and Zetia.  4.  Mixed hyperlipidemia with statin intolerance.  He is on Zetia and has declined other medications.  Last LDL 131.  COVID-19 Education: The signs and symptoms of COVID-19 were discussed with the patient and how to seek care for testing (follow up with PCP or arrange E-visit).  The importance of social distancing was discussed today.  Time:   Today, I have spent 6 minutes with the patient with telehealth technology discussing the above problems.     Medication Adjustments/Labs and Tests Ordered: Current medicines are reviewed at length with the patient today.  Concerns regarding medicines are outlined above.   Tests Ordered: No orders of the defined types were placed in this encounter.   Medication Changes: No orders of the defined types were placed in this encounter.   Follow Up:  In Person 6 months in the [place] office.  Signed, [name], MD  08/19/2018 1:07 PM     [place]

## 2018-09-15 ENCOUNTER — Other Ambulatory Visit: Payer: Self-pay | Admitting: Cardiology

## 2018-09-29 ENCOUNTER — Other Ambulatory Visit: Payer: Self-pay

## 2018-10-13 ENCOUNTER — Other Ambulatory Visit: Payer: Self-pay | Admitting: Cardiology

## 2018-10-13 DIAGNOSIS — I119 Hypertensive heart disease without heart failure: Secondary | ICD-10-CM

## 2018-10-31 DIAGNOSIS — Z8673 Personal history of transient ischemic attack (TIA), and cerebral infarction without residual deficits: Secondary | ICD-10-CM | POA: Diagnosis not present

## 2018-10-31 DIAGNOSIS — I6523 Occlusion and stenosis of bilateral carotid arteries: Secondary | ICD-10-CM | POA: Diagnosis not present

## 2018-10-31 DIAGNOSIS — I480 Paroxysmal atrial fibrillation: Secondary | ICD-10-CM | POA: Diagnosis not present

## 2018-10-31 DIAGNOSIS — F419 Anxiety disorder, unspecified: Secondary | ICD-10-CM | POA: Diagnosis not present

## 2018-10-31 DIAGNOSIS — K219 Gastro-esophageal reflux disease without esophagitis: Secondary | ICD-10-CM | POA: Diagnosis not present

## 2018-10-31 DIAGNOSIS — I25810 Atherosclerosis of coronary artery bypass graft(s) without angina pectoris: Secondary | ICD-10-CM | POA: Diagnosis not present

## 2018-10-31 DIAGNOSIS — E785 Hyperlipidemia, unspecified: Secondary | ICD-10-CM | POA: Diagnosis not present

## 2018-10-31 DIAGNOSIS — E782 Mixed hyperlipidemia: Secondary | ICD-10-CM | POA: Diagnosis not present

## 2018-10-31 DIAGNOSIS — J449 Chronic obstructive pulmonary disease, unspecified: Secondary | ICD-10-CM | POA: Diagnosis not present

## 2018-10-31 DIAGNOSIS — R7301 Impaired fasting glucose: Secondary | ICD-10-CM | POA: Diagnosis not present

## 2018-10-31 DIAGNOSIS — I10 Essential (primary) hypertension: Secondary | ICD-10-CM | POA: Diagnosis not present

## 2018-12-13 ENCOUNTER — Other Ambulatory Visit: Payer: Self-pay | Admitting: Cardiology

## 2018-12-21 DIAGNOSIS — E785 Hyperlipidemia, unspecified: Secondary | ICD-10-CM | POA: Diagnosis not present

## 2018-12-21 DIAGNOSIS — I10 Essential (primary) hypertension: Secondary | ICD-10-CM | POA: Diagnosis not present

## 2018-12-21 DIAGNOSIS — E782 Mixed hyperlipidemia: Secondary | ICD-10-CM | POA: Diagnosis not present

## 2018-12-21 DIAGNOSIS — R7301 Impaired fasting glucose: Secondary | ICD-10-CM | POA: Diagnosis not present

## 2018-12-28 DIAGNOSIS — I480 Paroxysmal atrial fibrillation: Secondary | ICD-10-CM | POA: Diagnosis not present

## 2018-12-28 DIAGNOSIS — I651 Occlusion and stenosis of basilar artery: Secondary | ICD-10-CM | POA: Diagnosis not present

## 2018-12-28 DIAGNOSIS — K219 Gastro-esophageal reflux disease without esophagitis: Secondary | ICD-10-CM | POA: Diagnosis not present

## 2018-12-28 DIAGNOSIS — I25810 Atherosclerosis of coronary artery bypass graft(s) without angina pectoris: Secondary | ICD-10-CM | POA: Diagnosis not present

## 2018-12-28 DIAGNOSIS — F419 Anxiety disorder, unspecified: Secondary | ICD-10-CM | POA: Diagnosis not present

## 2018-12-28 DIAGNOSIS — R7301 Impaired fasting glucose: Secondary | ICD-10-CM | POA: Diagnosis not present

## 2018-12-28 DIAGNOSIS — I10 Essential (primary) hypertension: Secondary | ICD-10-CM | POA: Diagnosis not present

## 2018-12-28 DIAGNOSIS — Z2821 Immunization not carried out because of patient refusal: Secondary | ICD-10-CM | POA: Diagnosis not present

## 2018-12-28 DIAGNOSIS — I6523 Occlusion and stenosis of bilateral carotid arteries: Secondary | ICD-10-CM | POA: Diagnosis not present

## 2018-12-28 DIAGNOSIS — J449 Chronic obstructive pulmonary disease, unspecified: Secondary | ICD-10-CM | POA: Diagnosis not present

## 2018-12-28 DIAGNOSIS — E782 Mixed hyperlipidemia: Secondary | ICD-10-CM | POA: Diagnosis not present

## 2018-12-28 DIAGNOSIS — N401 Enlarged prostate with lower urinary tract symptoms: Secondary | ICD-10-CM | POA: Diagnosis not present

## 2018-12-30 DIAGNOSIS — I6523 Occlusion and stenosis of bilateral carotid arteries: Secondary | ICD-10-CM | POA: Diagnosis not present

## 2018-12-30 DIAGNOSIS — J449 Chronic obstructive pulmonary disease, unspecified: Secondary | ICD-10-CM | POA: Diagnosis not present

## 2018-12-30 DIAGNOSIS — I480 Paroxysmal atrial fibrillation: Secondary | ICD-10-CM | POA: Diagnosis not present

## 2018-12-30 DIAGNOSIS — E785 Hyperlipidemia, unspecified: Secondary | ICD-10-CM | POA: Diagnosis not present

## 2018-12-30 DIAGNOSIS — R7301 Impaired fasting glucose: Secondary | ICD-10-CM | POA: Diagnosis not present

## 2018-12-30 DIAGNOSIS — K219 Gastro-esophageal reflux disease without esophagitis: Secondary | ICD-10-CM | POA: Diagnosis not present

## 2018-12-30 DIAGNOSIS — E782 Mixed hyperlipidemia: Secondary | ICD-10-CM | POA: Diagnosis not present

## 2018-12-30 DIAGNOSIS — Z8673 Personal history of transient ischemic attack (TIA), and cerebral infarction without residual deficits: Secondary | ICD-10-CM | POA: Diagnosis not present

## 2018-12-30 DIAGNOSIS — I25810 Atherosclerosis of coronary artery bypass graft(s) without angina pectoris: Secondary | ICD-10-CM | POA: Diagnosis not present

## 2018-12-30 DIAGNOSIS — I10 Essential (primary) hypertension: Secondary | ICD-10-CM | POA: Diagnosis not present

## 2018-12-30 DIAGNOSIS — G720 Drug-induced myopathy: Secondary | ICD-10-CM | POA: Diagnosis not present

## 2018-12-30 DIAGNOSIS — F419 Anxiety disorder, unspecified: Secondary | ICD-10-CM | POA: Diagnosis not present

## 2019-01-03 DIAGNOSIS — G720 Drug-induced myopathy: Secondary | ICD-10-CM | POA: Diagnosis not present

## 2019-01-03 DIAGNOSIS — K219 Gastro-esophageal reflux disease without esophagitis: Secondary | ICD-10-CM | POA: Diagnosis not present

## 2019-01-03 DIAGNOSIS — J449 Chronic obstructive pulmonary disease, unspecified: Secondary | ICD-10-CM | POA: Diagnosis not present

## 2019-01-03 DIAGNOSIS — F419 Anxiety disorder, unspecified: Secondary | ICD-10-CM | POA: Diagnosis not present

## 2019-01-03 DIAGNOSIS — I10 Essential (primary) hypertension: Secondary | ICD-10-CM | POA: Diagnosis not present

## 2019-01-03 DIAGNOSIS — E782 Mixed hyperlipidemia: Secondary | ICD-10-CM | POA: Diagnosis not present

## 2019-01-03 DIAGNOSIS — I25810 Atherosclerosis of coronary artery bypass graft(s) without angina pectoris: Secondary | ICD-10-CM | POA: Diagnosis not present

## 2019-01-03 DIAGNOSIS — R7301 Impaired fasting glucose: Secondary | ICD-10-CM | POA: Diagnosis not present

## 2019-01-03 DIAGNOSIS — I480 Paroxysmal atrial fibrillation: Secondary | ICD-10-CM | POA: Diagnosis not present

## 2019-01-03 DIAGNOSIS — Z8673 Personal history of transient ischemic attack (TIA), and cerebral infarction without residual deficits: Secondary | ICD-10-CM | POA: Diagnosis not present

## 2019-01-03 DIAGNOSIS — E785 Hyperlipidemia, unspecified: Secondary | ICD-10-CM | POA: Diagnosis not present

## 2019-01-03 DIAGNOSIS — I6523 Occlusion and stenosis of bilateral carotid arteries: Secondary | ICD-10-CM | POA: Diagnosis not present

## 2019-02-09 ENCOUNTER — Telehealth (INDEPENDENT_AMBULATORY_CARE_PROVIDER_SITE_OTHER): Payer: Medicare Other | Admitting: Family Medicine

## 2019-02-09 ENCOUNTER — Other Ambulatory Visit: Payer: Self-pay | Admitting: Cardiology

## 2019-02-09 ENCOUNTER — Encounter: Payer: Self-pay | Admitting: Cardiology

## 2019-02-09 VITALS — BP 129/77 | HR 60 | Ht 70.0 in | Wt 205.0 lb

## 2019-02-09 DIAGNOSIS — I6523 Occlusion and stenosis of bilateral carotid arteries: Secondary | ICD-10-CM | POA: Diagnosis not present

## 2019-02-09 DIAGNOSIS — Z79899 Other long term (current) drug therapy: Secondary | ICD-10-CM

## 2019-02-09 DIAGNOSIS — E782 Mixed hyperlipidemia: Secondary | ICD-10-CM | POA: Diagnosis not present

## 2019-02-09 DIAGNOSIS — I25119 Atherosclerotic heart disease of native coronary artery with unspecified angina pectoris: Secondary | ICD-10-CM | POA: Diagnosis not present

## 2019-02-09 DIAGNOSIS — I25700 Atherosclerosis of coronary artery bypass graft(s), unspecified, with unstable angina pectoris: Secondary | ICD-10-CM

## 2019-02-09 DIAGNOSIS — I4821 Permanent atrial fibrillation: Secondary | ICD-10-CM | POA: Diagnosis not present

## 2019-02-09 DIAGNOSIS — Z7982 Long term (current) use of aspirin: Secondary | ICD-10-CM

## 2019-02-09 NOTE — Patient Instructions (Addendum)
Medication Instructions:   Your physician recommends that you continue on your current medications as directed. Please refer to the Current Medication list given to you today.  Labwork:  NONE  Testing/Procedures:  NONE  Follow-Up:  Your physician recommends that you schedule a follow-up appointment in: 1 year (office or virtual). You will receive a reminder letter in the mail in about 10 months reminding you to call and schedule your appointment. If you don't receive this letter, please contact our office.  Any Other Special Instructions Will Be Listed Below (If Applicable).  If you need a refill on your cardiac medications before your next appointment, please call your pharmacy. 

## 2019-02-09 NOTE — Progress Notes (Addendum)
Virtual Visit via Telephone Note   This visit type was conducted due to national recommendations for restrictions regarding the COVID-19 Pandemic (e.g. social distancing) in an effort to limit this patient's exposure and mitigate transmission in our community.  Due to his co-morbid illnesses, this patient is at least at moderate risk for complications without adequate follow up.  This format is felt to be most appropriate for this patient at this time.  The patient did not have access to video technology/had technical difficulties with video requiring transitioning to audio format only (telephone).  All issues noted in this document were discussed and addressed.  No physical exam could be performed with this format.  Please refer to the patient's chart for his  consent to telehealth for [facility].   Date:  02/09/2019   ID:  William Elliott, DOB [birth year], MRN [id number]  Patient Location: Home Provider Location: Office  PCP:  [name], MD  Cardiologist:  [name], MD  Electrophysiologist:  None   Evaluation Performed:  Follow-Up Visit  Chief Complaint: 24-month follow-up for coronary artery disease, hyperlipidemia, hypertension, carotid artery disease  History of Present Illness:    William Elliott is a 78 y.o. male with last seen via telehealth with Dr. Domenic Polite in June of this year.  From a cardiac point of view patient has not been experiencing any progressive anginal or exertional symptoms nor any nitroglycerin use.  At previous visit he had continued to decline anticoagulation for concerns about rectal bleeding and hemorrhoids. He was also discontinued from beta-blocker due to significant bradycardia.  Spoke with patient about possible change from Zetia to Nexlizet.  Patient states his primary care provider had mentioned changing at last visit and may start him on this medication on follow-up visit in February of next year.  Medications reviewed with patient over  the phone.  There have been no medication changes since last visit.  Blood pressures well controlled today on current therapy of amlodipine 5 mg, losartan 50 mg, HCTZ 25 mg.  The patient does not have symptoms concerning for COVID-19 infection (fever, chills, cough, or new shortness of breath).    Past Medical History:  Diagnosis Date  . Acute lower GI bleeding 2008  . Anxiety   . Asthma   . Carotid artery disease ([place])    Asymptomatic left carotid bruit  . COPD (chronic obstructive pulmonary disease) ([place])   . Coronary artery disease    a. CABG - 1999 with LIMA-LAD, SVG-DIAG, SVG-OM, SVG-RPDA  b. Cath in setting of NSTEMI 03/18/2015: patent LIMA-LAD, occluded SVG-RCA, occluded SVG-D1, 99% stenosis of SVG-OM treated w/ DES  . Depression   . Dyslipidemia   . Erectile dysfunction   . Essential hypertension   . History of blood transfusion 2008  . History of stroke   . Hyperlipidemia   . Myocardial infarction [facility])    Past Surgical History:  Procedure Laterality Date  . CARDIAC CATHETERIZATION    . CARDIAC CATHETERIZATION N/A 03/18/2015   Procedure: Left Heart Cath and Cors/Grafts Angiography;  Surgeon: Leonie Man, MD;  Location: [place] [facility];  Service: Cardiovascular;  Laterality: N/A;  . CARDIAC CATHETERIZATION N/A 03/18/2015   Procedure: Coronary Stent Intervention;  Surgeon: Leonie Man, MD;  Location: [place] [facility];  Service: Cardiovascular;  Laterality: N/A;  . CATARACT EXTRACTION W/ INTRAOCULAR LENS  IMPLANT, BILATERAL Bilateral   . CORONARY ANGIOPLASTY    . CORONARY ARTERY BYPASS GRAFT  1999  . FRACTURE  SURGERY    . HERNIA REPAIR    . [facility]; took bone out of my right hip  . TONSILLECTOMY    . UMBILICAL HERNIA REPAIR  ~ 2000     Current Meds  Medication Sig  . ALPRAZolam (XANAX) 1 MG tablet Take 1 tablet (1 mg total) by mouth at bedtime as needed for anxiety.  Marland Kitchen amLODipine (NORVASC) 5 MG tablet TAKE 1  TABLET BY MOUTH DAILY  . aspirin EC 81 MG tablet Take 1 tablet (81 mg total) by mouth daily.  Marland Kitchen b complex vitamins tablet Take 1 tablet by mouth daily.  . hydrochlorothiazide (HYDRODIURIL) 25 MG tablet TAKE 1 TABLET BY MOUTH EVERY DAY  . isosorbide mononitrate (IMDUR) 30 MG 24 hr tablet TAKE 1 TABLET BY MOUTH DAILY  . losartan (COZAAR) 50 MG tablet Take 50 mg by mouth daily.  . multivitamin (THERAGRAN) per tablet Take 1 tablet by mouth daily.    . nitroGLYCERIN (NITROSTAT) 0.4 MG SL tablet Place 1 tablet (0.4 mg total) under the tongue every 5 (five) minutes x 3 doses as needed for chest pain (if no relief after 3rd dose, proceed to the ED for an evaluation).  . pantoprazole (PROTONIX) 40 MG tablet TAKE 1 TABLET BY MOUTH EVERY DAY  . potassium chloride SA (K-DUR) 20 MEQ tablet TAKE 1 TABLET BY MOUTH TWICE DAILY  . Tiotropium Bromide-Olodaterol (STIOLTO RESPIMAT) 2.5-2.5 MCG/ACT AERS Inhale 1 puff into the lungs 2 (two) times a day.  . VENTOLIN HFA 108 (90 Base) MCG/ACT inhaler Inhale 2 puffs into the lungs 2 (two) times daily as needed.  Marland Kitchen ZETIA 10 MG tablet TAKE 1 TABLET BY MOUTH DAILY     Allergies:   Anoro ellipta [umeclidinium-vilanterol], Fenofibrate, Lipitor [atorvastatin calcium], Niaspan [niacin], and Zocor [simvastatin]   Social History   Tobacco Use  . Smoking status: Former Smoker    Years: 10.00    Types: Cigars    Quit date: 03/02/1997    Years since quitting: 21.9  . Smokeless tobacco: Never Used  . Tobacco comment: smoked cigars x 10 yrs, never smoked cigarettes  Substance Use Topics  . Alcohol use: No    Alcohol/week: 0.0 standard drinks    Comment: "drank some when I was young; quit in the 1990s"  . Drug use: No     Family Hx: The patient's family history includes Heart attack in his father; Heart disease in his father; Hypertension in his mother; Stroke in his mother.  ROS:   Please see the history of present illness.    All other systems reviewed and are  negative.   Prior CV studies:   The following studies were reviewed today:  Carotid Dopplers 02/17/2018: Summary: Right Carotid: Velocities in the right ICA are consistent with a 1-39% stenosis. Non-hemodynamically significant plaque <50% noted in the CCA.  Left Carotid: Velocities in the left ICA are consistent with a 1-39% stenosis. Non-hemodynamically significant plaque noted in the CCA. The ECA appears >50% stenosed.  Vertebrals: Bilateral vertebral arteries demonstrate antegrade flow. Subclavians: Normal flow hemodynamics were seen in bilateral subclavian arteries.  Echocardiogram 12/09/2015: Study Conclusions  - Left ventricle: The cavity size was normal. There was severe concentric hypertrophy. Systolic function was vigorous. The estimated ejection fraction was in the range of 65% to 70%. Wall motion was normal; there were no regional wall motion abnormalities. The study was not technically sufficient to allow evaluation of LV diastolic dysfunction due to atrial  fibrillation. - Aortic valve: Trileaflet; mildly thickened, mildly calcified leaflets. Transvalvular velocity was minimally increased. There was no stenosis. There was mild regurgitation. - Ascending aorta: The ascending aorta was normal in size. - Mitral valve: Mildly thickened leaflets . There was mild regurgitation. - Left atrium: The atrium was mildly dilated. - Right ventricle: Systolic function was normal. - Right atrium: The atrium was normal in size. - Tricuspid valve: Structurally normal valve. There was trivial regurgitation. - Pulmonic valve: Structurally normal valve. - Inferior vena cava: The vessel was normal in size. - Pericardium, extracardiac: There was no pericardial effusion.  Labs/Other Tests and Data Reviewed:    EKG:  An ECG dated February 18, 2018 was personally reviewed today and demonstrated:  Probable  atypical flutter versus atrial tachycardia with 4-1 block.  Rate of 67  Recent Labs: Lab work from June 09, 2018 showed hemoglobin 14.7, hematocrit 44, platelets 215, glucose 134, creatinine 1.12, GFR 63, sodium 136, potassium 4.5, hemoglobin A1c 5.9%. Recent Lipid Panel Labs in April 2020; AST 24, ALT 25, total cholesterol 195, triglycerides 157, HDL 33, LDL 131  Wt Readings from Last 3 Encounters:  02/09/19 205 lb (93 kg)  02/18/18 209 lb 3.2 oz (94.9 kg)  08/23/17 204 lb (92.5 kg)     Objective:    Vital Signs:  BP 129/77   Pulse 60   Ht 5\' 10"  (1.778 m)   Wt 205 lb (93 kg)   BMI 29.41 kg/m    VITAL SIGNS:  reviewed   Patient speaking in complete sentences with normal speech pattern.  No evidence of dyspnea, cough, or wheezing noted  ASSESSMENT & PLAN:    1. Permanent atrial fibrillation [facility]) Patient denies any palpitations or arrhythmias.  Heart rate today was 60.  He continues without anticoagulation other than aspirin 81 mg daily.  2. Coronary artery disease involving native coronary artery of native heart with angina pectoris [facility]) Patient denies any progressive anginal or exertional symptoms.  States he does walk some around the house.  He is practicing social distancing and wearing a mask.  Continues on aspirin 81 mg, Imdur 30 mg, amlodipine 5 mg.  3. Mixed hyperlipidemia Intolerant of statin medication.  Currently on Zetia only.  Last LDL 115, direct LDL 117.  Patient states PCP plans on putting him on Nexlizet at next follow-up per a previous discussion with him in the office since he is intolerant to statin medication.  4. Bilateral carotid artery stenosis Mild 1 to 39% stenosis in both left and right internal carotid arteries.  Less than 50% stenosis in common carotid artery in the right.  The ECA  left appears to be greater than 50% stenosed.  There was no hemodynamically significant plaque in CCA  left side.  May need to repeat carotid Dopplers in a year to  check for any progression.  COVID-19 Education: The signs and symptoms of COVID-19 were discussed with the patient and how to seek care for testing (follow up with PCP or arrange E-visit).  The importance of social distancing was discussed today.  Time:   Today, I have spent 15 minutes with the patient with telehealth technology discussing the above problems.     Medication Adjustments/Labs and Tests Ordered: Current medicines are reviewed at length with the patient today.  Concerns regarding medicines are outlined above.   Tests Ordered: No orders of the defined types were placed in this encounter.   Medication Changes: No orders of the defined types were placed in  this encounter.   Follow Up:  Either In Person or Virtual in 1 year(s)  Signed, Verta Ellen, NP  02/09/2019 10:38 AM    [place]

## 2019-03-13 ENCOUNTER — Other Ambulatory Visit: Payer: Self-pay | Admitting: Cardiology

## 2019-03-22 ENCOUNTER — Encounter: Payer: Self-pay | Admitting: *Deleted

## 2019-03-22 DIAGNOSIS — E785 Hyperlipidemia, unspecified: Secondary | ICD-10-CM | POA: Diagnosis not present

## 2019-03-22 DIAGNOSIS — R7301 Impaired fasting glucose: Secondary | ICD-10-CM | POA: Diagnosis not present

## 2019-03-22 DIAGNOSIS — I480 Paroxysmal atrial fibrillation: Secondary | ICD-10-CM | POA: Diagnosis not present

## 2019-03-22 DIAGNOSIS — I6523 Occlusion and stenosis of bilateral carotid arteries: Secondary | ICD-10-CM | POA: Diagnosis not present

## 2019-03-22 DIAGNOSIS — I10 Essential (primary) hypertension: Secondary | ICD-10-CM | POA: Diagnosis not present

## 2019-03-22 DIAGNOSIS — I25810 Atherosclerosis of coronary artery bypass graft(s) without angina pectoris: Secondary | ICD-10-CM | POA: Diagnosis not present

## 2019-03-22 DIAGNOSIS — Z8673 Personal history of transient ischemic attack (TIA), and cerebral infarction without residual deficits: Secondary | ICD-10-CM | POA: Diagnosis not present

## 2019-03-22 DIAGNOSIS — K219 Gastro-esophageal reflux disease without esophagitis: Secondary | ICD-10-CM | POA: Diagnosis not present

## 2019-03-22 DIAGNOSIS — F419 Anxiety disorder, unspecified: Secondary | ICD-10-CM | POA: Diagnosis not present

## 2019-03-22 DIAGNOSIS — G720 Drug-induced myopathy: Secondary | ICD-10-CM | POA: Diagnosis not present

## 2019-03-22 DIAGNOSIS — E782 Mixed hyperlipidemia: Secondary | ICD-10-CM | POA: Diagnosis not present

## 2019-03-22 DIAGNOSIS — J449 Chronic obstructive pulmonary disease, unspecified: Secondary | ICD-10-CM | POA: Diagnosis not present

## 2019-03-22 NOTE — Progress Notes (Signed)
Notification received from covermymeds that zetia 10 mg approved from March 03, 2019-March 21, 2020.

## 2019-04-07 DIAGNOSIS — E785 Hyperlipidemia, unspecified: Secondary | ICD-10-CM | POA: Diagnosis not present

## 2019-04-07 DIAGNOSIS — E782 Mixed hyperlipidemia: Secondary | ICD-10-CM | POA: Diagnosis not present

## 2019-04-07 DIAGNOSIS — R7301 Impaired fasting glucose: Secondary | ICD-10-CM | POA: Diagnosis not present

## 2019-04-07 DIAGNOSIS — I10 Essential (primary) hypertension: Secondary | ICD-10-CM | POA: Diagnosis not present

## 2019-04-13 DIAGNOSIS — N401 Enlarged prostate with lower urinary tract symptoms: Secondary | ICD-10-CM | POA: Diagnosis not present

## 2019-04-13 DIAGNOSIS — E782 Mixed hyperlipidemia: Secondary | ICD-10-CM | POA: Diagnosis not present

## 2019-04-13 DIAGNOSIS — J449 Chronic obstructive pulmonary disease, unspecified: Secondary | ICD-10-CM | POA: Diagnosis not present

## 2019-04-13 DIAGNOSIS — K219 Gastro-esophageal reflux disease without esophagitis: Secondary | ICD-10-CM | POA: Diagnosis not present

## 2019-04-13 DIAGNOSIS — F419 Anxiety disorder, unspecified: Secondary | ICD-10-CM | POA: Diagnosis not present

## 2019-04-13 DIAGNOSIS — I10 Essential (primary) hypertension: Secondary | ICD-10-CM | POA: Diagnosis not present

## 2019-04-13 DIAGNOSIS — I6523 Occlusion and stenosis of bilateral carotid arteries: Secondary | ICD-10-CM | POA: Diagnosis not present

## 2019-04-13 DIAGNOSIS — I651 Occlusion and stenosis of basilar artery: Secondary | ICD-10-CM | POA: Diagnosis not present

## 2019-04-13 DIAGNOSIS — R7301 Impaired fasting glucose: Secondary | ICD-10-CM | POA: Diagnosis not present

## 2019-04-13 DIAGNOSIS — I25810 Atherosclerosis of coronary artery bypass graft(s) without angina pectoris: Secondary | ICD-10-CM | POA: Diagnosis not present

## 2019-04-13 DIAGNOSIS — Z2821 Immunization not carried out because of patient refusal: Secondary | ICD-10-CM | POA: Diagnosis not present

## 2019-04-13 DIAGNOSIS — I480 Paroxysmal atrial fibrillation: Secondary | ICD-10-CM | POA: Diagnosis not present

## 2019-06-26 ENCOUNTER — Telehealth: Payer: Self-pay | Admitting: Cardiology

## 2019-06-26 DIAGNOSIS — R7301 Impaired fasting glucose: Secondary | ICD-10-CM | POA: Diagnosis not present

## 2019-06-26 DIAGNOSIS — I25810 Atherosclerosis of coronary artery bypass graft(s) without angina pectoris: Secondary | ICD-10-CM | POA: Diagnosis not present

## 2019-06-26 DIAGNOSIS — Z2821 Immunization not carried out because of patient refusal: Secondary | ICD-10-CM | POA: Diagnosis not present

## 2019-06-26 DIAGNOSIS — I651 Occlusion and stenosis of basilar artery: Secondary | ICD-10-CM | POA: Diagnosis not present

## 2019-06-26 DIAGNOSIS — F419 Anxiety disorder, unspecified: Secondary | ICD-10-CM | POA: Diagnosis not present

## 2019-06-26 DIAGNOSIS — I10 Essential (primary) hypertension: Secondary | ICD-10-CM | POA: Diagnosis not present

## 2019-06-26 DIAGNOSIS — J449 Chronic obstructive pulmonary disease, unspecified: Secondary | ICD-10-CM | POA: Diagnosis not present

## 2019-06-26 DIAGNOSIS — N401 Enlarged prostate with lower urinary tract symptoms: Secondary | ICD-10-CM | POA: Diagnosis not present

## 2019-06-26 DIAGNOSIS — K219 Gastro-esophageal reflux disease without esophagitis: Secondary | ICD-10-CM | POA: Diagnosis not present

## 2019-06-26 DIAGNOSIS — I6523 Occlusion and stenosis of bilateral carotid arteries: Secondary | ICD-10-CM | POA: Diagnosis not present

## 2019-06-26 DIAGNOSIS — I480 Paroxysmal atrial fibrillation: Secondary | ICD-10-CM | POA: Diagnosis not present

## 2019-06-26 DIAGNOSIS — E782 Mixed hyperlipidemia: Secondary | ICD-10-CM | POA: Diagnosis not present

## 2019-06-26 NOTE — Telephone Encounter (Addendum)
[facility] [employer] contacted-Per [facility]-patient no longer covered under their plan.  New Prior authorization initiated through covermymeds for brand ZETIA 10 mg and sent to [facility]. Awaiting response from insurance.

## 2019-06-26 NOTE — Telephone Encounter (Signed)
Patient called requesting to see if we can get authorization for his ZETIA 10 MG tablet

## 2019-06-26 NOTE — Telephone Encounter (Signed)
Notification faxed to [facility]

## 2019-07-06 DIAGNOSIS — F419 Anxiety disorder, unspecified: Secondary | ICD-10-CM | POA: Diagnosis not present

## 2019-07-06 DIAGNOSIS — I480 Paroxysmal atrial fibrillation: Secondary | ICD-10-CM | POA: Diagnosis not present

## 2019-07-06 DIAGNOSIS — Z8673 Personal history of transient ischemic attack (TIA), and cerebral infarction without residual deficits: Secondary | ICD-10-CM | POA: Diagnosis not present

## 2019-07-06 DIAGNOSIS — E785 Hyperlipidemia, unspecified: Secondary | ICD-10-CM | POA: Diagnosis not present

## 2019-07-06 DIAGNOSIS — J449 Chronic obstructive pulmonary disease, unspecified: Secondary | ICD-10-CM | POA: Diagnosis not present

## 2019-07-06 DIAGNOSIS — K219 Gastro-esophageal reflux disease without esophagitis: Secondary | ICD-10-CM | POA: Diagnosis not present

## 2019-07-06 DIAGNOSIS — I6523 Occlusion and stenosis of bilateral carotid arteries: Secondary | ICD-10-CM | POA: Diagnosis not present

## 2019-07-06 DIAGNOSIS — G720 Drug-induced myopathy: Secondary | ICD-10-CM | POA: Diagnosis not present

## 2019-07-06 DIAGNOSIS — I10 Essential (primary) hypertension: Secondary | ICD-10-CM | POA: Diagnosis not present

## 2019-07-06 DIAGNOSIS — E782 Mixed hyperlipidemia: Secondary | ICD-10-CM | POA: Diagnosis not present

## 2019-07-06 DIAGNOSIS — I25810 Atherosclerosis of coronary artery bypass graft(s) without angina pectoris: Secondary | ICD-10-CM | POA: Diagnosis not present

## 2019-07-06 DIAGNOSIS — R7301 Impaired fasting glucose: Secondary | ICD-10-CM | POA: Diagnosis not present

## 2019-07-24 DIAGNOSIS — J069 Acute upper respiratory infection, unspecified: Secondary | ICD-10-CM | POA: Diagnosis not present

## 2019-08-08 ENCOUNTER — Other Ambulatory Visit: Payer: Self-pay | Admitting: Cardiology

## 2019-10-02 DIAGNOSIS — E785 Hyperlipidemia, unspecified: Secondary | ICD-10-CM | POA: Diagnosis not present

## 2019-10-02 DIAGNOSIS — I480 Paroxysmal atrial fibrillation: Secondary | ICD-10-CM | POA: Diagnosis not present

## 2019-10-02 DIAGNOSIS — I10 Essential (primary) hypertension: Secondary | ICD-10-CM | POA: Diagnosis not present

## 2019-10-02 DIAGNOSIS — J449 Chronic obstructive pulmonary disease, unspecified: Secondary | ICD-10-CM | POA: Diagnosis not present

## 2019-11-15 DIAGNOSIS — H524 Presbyopia: Secondary | ICD-10-CM | POA: Diagnosis not present

## 2019-11-15 DIAGNOSIS — H02822 Cysts of right lower eyelid: Secondary | ICD-10-CM | POA: Diagnosis not present

## 2019-11-15 DIAGNOSIS — H26493 Other secondary cataract, bilateral: Secondary | ICD-10-CM | POA: Diagnosis not present

## 2019-11-15 DIAGNOSIS — H43813 Vitreous degeneration, bilateral: Secondary | ICD-10-CM | POA: Diagnosis not present

## 2019-11-29 DIAGNOSIS — I10 Essential (primary) hypertension: Secondary | ICD-10-CM | POA: Diagnosis not present

## 2019-11-29 DIAGNOSIS — J449 Chronic obstructive pulmonary disease, unspecified: Secondary | ICD-10-CM | POA: Diagnosis not present

## 2019-11-29 DIAGNOSIS — I480 Paroxysmal atrial fibrillation: Secondary | ICD-10-CM | POA: Diagnosis not present

## 2019-11-29 DIAGNOSIS — E785 Hyperlipidemia, unspecified: Secondary | ICD-10-CM | POA: Diagnosis not present

## 2019-12-12 DIAGNOSIS — I10 Essential (primary) hypertension: Secondary | ICD-10-CM | POA: Diagnosis not present

## 2019-12-12 DIAGNOSIS — J449 Chronic obstructive pulmonary disease, unspecified: Secondary | ICD-10-CM | POA: Diagnosis not present

## 2019-12-12 DIAGNOSIS — E785 Hyperlipidemia, unspecified: Secondary | ICD-10-CM | POA: Diagnosis not present

## 2019-12-12 DIAGNOSIS — I480 Paroxysmal atrial fibrillation: Secondary | ICD-10-CM | POA: Diagnosis not present

## 2019-12-14 DIAGNOSIS — H26491 Other secondary cataract, right eye: Secondary | ICD-10-CM | POA: Diagnosis not present

## 2019-12-15 ENCOUNTER — Other Ambulatory Visit: Payer: Self-pay | Admitting: Cardiology

## 2019-12-22 DIAGNOSIS — H905 Unspecified sensorineural hearing loss: Secondary | ICD-10-CM | POA: Diagnosis not present

## 2020-01-11 DIAGNOSIS — J069 Acute upper respiratory infection, unspecified: Secondary | ICD-10-CM | POA: Diagnosis not present

## 2020-02-04 ENCOUNTER — Other Ambulatory Visit: Payer: Self-pay | Admitting: Cardiology

## 2020-03-01 DIAGNOSIS — Z8673 Personal history of transient ischemic attack (TIA), and cerebral infarction without residual deficits: Secondary | ICD-10-CM | POA: Diagnosis not present

## 2020-03-01 DIAGNOSIS — I25810 Atherosclerosis of coronary artery bypass graft(s) without angina pectoris: Secondary | ICD-10-CM | POA: Diagnosis not present

## 2020-03-01 DIAGNOSIS — I6523 Occlusion and stenosis of bilateral carotid arteries: Secondary | ICD-10-CM | POA: Diagnosis not present

## 2020-03-01 DIAGNOSIS — I480 Paroxysmal atrial fibrillation: Secondary | ICD-10-CM | POA: Diagnosis not present

## 2020-03-01 DIAGNOSIS — E782 Mixed hyperlipidemia: Secondary | ICD-10-CM | POA: Diagnosis not present

## 2020-03-03 DIAGNOSIS — M791 Myalgia, unspecified site: Secondary | ICD-10-CM | POA: Diagnosis not present

## 2020-03-03 DIAGNOSIS — Z20822 Contact with and (suspected) exposure to covid-19: Secondary | ICD-10-CM | POA: Diagnosis not present

## 2020-03-03 DIAGNOSIS — U071 COVID-19: Secondary | ICD-10-CM | POA: Diagnosis not present

## 2020-03-04 ENCOUNTER — Other Ambulatory Visit: Payer: Self-pay | Admitting: Cardiology

## 2020-03-12 DIAGNOSIS — R509 Fever, unspecified: Secondary | ICD-10-CM | POA: Diagnosis not present

## 2020-03-12 DIAGNOSIS — M791 Myalgia, unspecified site: Secondary | ICD-10-CM | POA: Diagnosis not present

## 2020-03-12 DIAGNOSIS — R0602 Shortness of breath: Secondary | ICD-10-CM | POA: Diagnosis not present

## 2020-03-12 DIAGNOSIS — U071 COVID-19: Secondary | ICD-10-CM | POA: Diagnosis not present

## 2020-03-12 DIAGNOSIS — R0981 Nasal congestion: Secondary | ICD-10-CM | POA: Diagnosis not present

## 2020-03-12 DIAGNOSIS — R059 Cough, unspecified: Secondary | ICD-10-CM | POA: Diagnosis not present

## 2020-03-15 ENCOUNTER — Other Ambulatory Visit: Payer: Self-pay | Admitting: Cardiology

## 2020-04-02 ENCOUNTER — Telehealth: Payer: Self-pay | Admitting: Cardiology

## 2020-04-02 DIAGNOSIS — I119 Hypertensive heart disease without heart failure: Secondary | ICD-10-CM

## 2020-04-02 MED ORDER — NITROGLYCERIN 0.4 MG SL SUBL: 0.4000 mg | SUBLINGUAL_TABLET | SUBLINGUAL | 3 refills | Status: DC | PRN

## 2020-04-02 MED ORDER — ISOSORBIDE MONONITRATE ER 30 MG PO TB24: 30.0000 mg | ORAL_TABLET | Freq: Two times a day (BID) | ORAL | 3 refills | Status: DC

## 2020-04-02 NOTE — Telephone Encounter (Signed)
I am sorry that he is having trouble. I hope he starts to feel better after recent COVID-19 diagnosis. We could increase his Imdur to 30 mg twice daily as long as blood pressure remains stable.

## 2020-04-02 NOTE — Telephone Encounter (Signed)
Pt c/o pain in chest 3-4 times weekly for the last 2 weeks with pain lasting about 10 mins at a time rating 7/10 and has been taking 1 NTG which relieves the pain - pain is happening mostly at night when he lays down - is recently recovering from [place] and this is when chest pain started - BP 130s/60s HR 60s-70s - is taking Imdur 30 mg daily and wanted to know if this could be increased - has f/u appt in March

## 2020-04-02 NOTE — Telephone Encounter (Signed)
Pt voiced understanding - updated medication list 

## 2020-04-02 NOTE — Telephone Encounter (Signed)
Pt c/o medication issue:  1. Name of Medication: isosorbide mononitrate (IMDUR) 30 MG 24 hr tablet   2. How are you currently taking this medication (dosage and times per day)? As prescribed - 1 Daily  3. Are you having a reaction (difficulty breathing--STAT)? No   4. What is your medication issue? Patient request dosage increase of his isosorbide mononitrate.   And is requesting a refill.   1. Which medications need to be refilled? (please list name of each medication and dose if known) nitroGLYCERIN (NITROSTAT) 0.4 MG SL tablet He states he only has a few left and they are old.  2. Which pharmacy/location (including street and city if local pharmacy) is medication to be sent to?  [facility]  3. Do they need a 30 day or 90 day supply? [place]

## 2020-04-03 ENCOUNTER — Telehealth: Payer: Self-pay | Admitting: Cardiology

## 2020-04-03 NOTE — Telephone Encounter (Signed)
Spoke with pt who denies any chest pain since increase of Imdur 30 mg bid - says the medication seems to be working - and would contact us back if symptoms returned - pt son also made aware of recent med increase and no symptoms since increase - pt son appreciative

## 2020-04-03 NOTE — Telephone Encounter (Signed)
Pt c/o of Chest Pain: Received telephone call from son Datrell Dunton. Son lives out of town. States that his mother called stating that patient continues to have off and on CP. Son is concerned that father needs an EKG.

## 2020-04-19 ENCOUNTER — Encounter (HOSPITAL_COMMUNITY): Payer: Self-pay | Admitting: Emergency Medicine

## 2020-04-19 ENCOUNTER — Inpatient Hospital Stay (HOSPITAL_COMMUNITY)
Admission: EM | Admit: 2020-04-19 | Discharge: 2020-04-23 | DRG: 247 | Disposition: A | Discharge status: Home / Self Care | Payer: Medicare Other | Attending: Cardiology | Admitting: Cardiology

## 2020-04-19 ENCOUNTER — Telehealth: Payer: Self-pay | Admitting: Cardiology

## 2020-04-19 ENCOUNTER — Other Ambulatory Visit: Payer: Self-pay

## 2020-04-19 ENCOUNTER — Emergency Department (HOSPITAL_COMMUNITY): Payer: Medicare Other

## 2020-04-19 DIAGNOSIS — I214 Non-ST elevation (NSTEMI) myocardial infarction: Secondary | ICD-10-CM | POA: Diagnosis not present

## 2020-04-19 DIAGNOSIS — Z888 Allergy status to other drugs, medicaments and biological substances status: Secondary | ICD-10-CM | POA: Diagnosis not present

## 2020-04-19 DIAGNOSIS — Z8673 Personal history of transient ischemic attack (TIA), and cerebral infarction without residual deficits: Secondary | ICD-10-CM

## 2020-04-19 DIAGNOSIS — Z9841 Cataract extraction status, right eye: Secondary | ICD-10-CM | POA: Diagnosis not present

## 2020-04-19 DIAGNOSIS — I6523 Occlusion and stenosis of bilateral carotid arteries: Secondary | ICD-10-CM | POA: Diagnosis not present

## 2020-04-19 DIAGNOSIS — I10 Essential (primary) hypertension: Secondary | ICD-10-CM | POA: Diagnosis present

## 2020-04-19 DIAGNOSIS — E8770 Fluid overload, unspecified: Secondary | ICD-10-CM | POA: Diagnosis present

## 2020-04-19 DIAGNOSIS — Z20822 Contact with and (suspected) exposure to covid-19: Secondary | ICD-10-CM | POA: Diagnosis present

## 2020-04-19 DIAGNOSIS — I25718 Atherosclerosis of autologous vein coronary artery bypass graft(s) with other forms of angina pectoris: Secondary | ICD-10-CM | POA: Diagnosis present

## 2020-04-19 DIAGNOSIS — Z7982 Long term (current) use of aspirin: Secondary | ICD-10-CM

## 2020-04-19 DIAGNOSIS — I358 Other nonrheumatic aortic valve disorders: Secondary | ICD-10-CM | POA: Diagnosis not present

## 2020-04-19 DIAGNOSIS — F419 Anxiety disorder, unspecified: Secondary | ICD-10-CM | POA: Diagnosis present

## 2020-04-19 DIAGNOSIS — Z8616 Personal history of COVID-19: Secondary | ICD-10-CM | POA: Diagnosis not present

## 2020-04-19 DIAGNOSIS — Z951 Presence of aortocoronary bypass graft: Secondary | ICD-10-CM | POA: Diagnosis not present

## 2020-04-19 DIAGNOSIS — I4821 Permanent atrial fibrillation: Secondary | ICD-10-CM | POA: Diagnosis not present

## 2020-04-19 DIAGNOSIS — I493 Ventricular premature depolarization: Secondary | ICD-10-CM | POA: Diagnosis present

## 2020-04-19 DIAGNOSIS — Z79899 Other long term (current) drug therapy: Secondary | ICD-10-CM | POA: Diagnosis not present

## 2020-04-19 DIAGNOSIS — Z87891 Personal history of nicotine dependence: Secondary | ICD-10-CM

## 2020-04-19 DIAGNOSIS — Z961 Presence of intraocular lens: Secondary | ICD-10-CM | POA: Diagnosis not present

## 2020-04-19 DIAGNOSIS — Z8249 Family history of ischemic heart disease and other diseases of the circulatory system: Secondary | ICD-10-CM

## 2020-04-19 DIAGNOSIS — J449 Chronic obstructive pulmonary disease, unspecified: Secondary | ICD-10-CM | POA: Diagnosis present

## 2020-04-19 DIAGNOSIS — I2584 Coronary atherosclerosis due to calcified coronary lesion: Secondary | ICD-10-CM | POA: Diagnosis present

## 2020-04-19 DIAGNOSIS — Z823 Family history of stroke: Secondary | ICD-10-CM

## 2020-04-19 DIAGNOSIS — R079 Chest pain, unspecified: Secondary | ICD-10-CM | POA: Diagnosis not present

## 2020-04-19 DIAGNOSIS — E785 Hyperlipidemia, unspecified: Secondary | ICD-10-CM | POA: Diagnosis not present

## 2020-04-19 DIAGNOSIS — I25118 Atherosclerotic heart disease of native coronary artery with other forms of angina pectoris: Secondary | ICD-10-CM | POA: Diagnosis not present

## 2020-04-19 DIAGNOSIS — I2510 Atherosclerotic heart disease of native coronary artery without angina pectoris: Secondary | ICD-10-CM | POA: Diagnosis not present

## 2020-04-19 DIAGNOSIS — J90 Pleural effusion, not elsewhere classified: Secondary | ICD-10-CM | POA: Diagnosis not present

## 2020-04-19 DIAGNOSIS — Z955 Presence of coronary angioplasty implant and graft: Secondary | ICD-10-CM

## 2020-04-19 DIAGNOSIS — J811 Chronic pulmonary edema: Secondary | ICD-10-CM | POA: Diagnosis not present

## 2020-04-19 DIAGNOSIS — Z9842 Cataract extraction status, left eye: Secondary | ICD-10-CM | POA: Diagnosis not present

## 2020-04-19 DIAGNOSIS — I25810 Atherosclerosis of coronary artery bypass graft(s) without angina pectoris: Secondary | ICD-10-CM | POA: Diagnosis not present

## 2020-04-19 DIAGNOSIS — I252 Old myocardial infarction: Secondary | ICD-10-CM

## 2020-04-19 LAB — BASIC METABOLIC PANEL
Anion gap: 7 (ref 5–15)
BUN: 16 mg/dL (ref 8–23)
CO2: 23 mmol/L (ref 22–32)
Calcium: 9.6 mg/dL (ref 8.9–10.3)
Chloride: 105 mmol/L (ref 98–111)
Creatinine, Ser: 0.98 mg/dL (ref 0.61–1.24)
GFR, Estimated: >60 mL/min (ref >60)
Glucose, Bld: 136 mg/dL — ABNORMAL HIGH (ref 70–99)
Potassium: 4.1 mmol/L (ref 3.5–5.1)
Sodium: 135 mmol/L (ref 135–145)

## 2020-04-19 LAB — CBC
HCT: 37.1 % — ABNORMAL LOW (ref 39.0–52.0)
Hemoglobin: 11.5 g/dL — ABNORMAL LOW (ref 13.0–17.0)
MCH: 26.9 pg (ref 26.0–34.0)
MCHC: 31 g/dL (ref 30.0–36.0)
MCV: 86.7 fL (ref 80.0–100.0)
Platelets: 477 10*3/uL — ABNORMAL HIGH (ref 150–400)
RBC: 4.28 MIL/uL (ref 4.22–5.81)
RDW: 20.9 % — ABNORMAL HIGH (ref 11.5–15.5)
WBC: 11.4 10*3/uL — ABNORMAL HIGH (ref 4.0–10.5)
nRBC: 0 % (ref 0.0–0.2)

## 2020-04-19 LAB — RESP PANEL BY RT-PCR (FLU A&B, COVID) ARPGX2
Influenza A by PCR: NEGATIVE
Influenza B by PCR: NEGATIVE
SARS Coronavirus 2 by RT PCR: NEGATIVE

## 2020-04-19 LAB — TROPONIN I (HIGH SENSITIVITY)
Troponin I (High Sensitivity): >27000 ng/L (ref <18)
Troponin I (High Sensitivity): >27000 ng/L (ref <18)

## 2020-04-19 MED ORDER — ASPIRIN 81 MG PO CHEW
324.0000 mg | CHEWABLE_TABLET | Freq: Once | ORAL | Status: AC
  Administered 2020-04-19: 324 mg via ORAL
  Filled 2020-04-19: qty 4

## 2020-04-19 MED ORDER — ALPRAZOLAM 0.5 MG PO TABS
1.0000 mg | ORAL_TABLET | Freq: Every evening | ORAL | Status: DC | PRN
  Administered 2020-04-19: 1 mg via ORAL
  Filled 2020-04-19: qty 2

## 2020-04-19 MED ORDER — BENZONATATE 100 MG PO CAPS: 100.0000 mg | ORAL_CAPSULE | Freq: Three times a day (TID) | ORAL | Status: DC | PRN

## 2020-04-19 MED ORDER — ISOSORBIDE MONONITRATE ER 30 MG PO TB24
30.0000 mg | ORAL_TABLET | Freq: Two times a day (BID) | ORAL | Status: DC
  Administered 2020-04-19 – 2020-04-23 (×8): 30 mg via ORAL
  Filled 2020-04-19 (×8): qty 1

## 2020-04-19 MED ORDER — LOSARTAN POTASSIUM 50 MG PO TABS
50.0000 mg | ORAL_TABLET | Freq: Every day | ORAL | Status: DC
  Administered 2020-04-20 – 2020-04-22 (×3): 50 mg via ORAL
  Filled 2020-04-19 (×3): qty 1

## 2020-04-19 MED ORDER — ONDANSETRON HCL 4 MG/2ML IJ SOLN: 4.0000 mg | Freq: Four times a day (QID) | INTRAMUSCULAR | Status: DC | PRN

## 2020-04-19 MED ORDER — ADULT MULTIVITAMIN W/MINERALS CH
1.0000 | ORAL_TABLET | Freq: Every day | ORAL | Status: DC
  Administered 2020-04-20 – 2020-04-23 (×4): 1 via ORAL
  Filled 2020-04-19 (×4): qty 1

## 2020-04-19 MED ORDER — B COMPLEX PO TABS: 1.0000 | ORAL_TABLET | Freq: Every day | ORAL | Status: DC

## 2020-04-19 MED ORDER — AMLODIPINE BESYLATE 5 MG PO TABS
5.0000 mg | ORAL_TABLET | Freq: Every day | ORAL | Status: DC
  Administered 2020-04-20 – 2020-04-23 (×4): 5 mg via ORAL
  Filled 2020-04-19 (×4): qty 1

## 2020-04-19 MED ORDER — ACETAMINOPHEN 325 MG PO TABS
650.0000 mg | ORAL_TABLET | ORAL | Status: DC | PRN
  Administered 2020-04-21: 650 mg via ORAL
  Filled 2020-04-19: qty 2

## 2020-04-19 MED ORDER — PANTOPRAZOLE SODIUM 40 MG PO TBEC
40.0000 mg | DELAYED_RELEASE_TABLET | Freq: Every day | ORAL | Status: DC
  Administered 2020-04-20 – 2020-04-23 (×4): 40 mg via ORAL
  Filled 2020-04-19 (×4): qty 1

## 2020-04-19 MED ORDER — ALBUTEROL SULFATE HFA 108 (90 BASE) MCG/ACT IN AERS
2.0000 | INHALATION_SPRAY | Freq: Two times a day (BID) | RESPIRATORY_TRACT | Status: DC | PRN
  Filled 2020-04-19: qty 6.7

## 2020-04-19 MED ORDER — ARFORMOTEROL TARTRATE 15 MCG/2ML IN NEBU
15.0000 ug | INHALATION_SOLUTION | Freq: Two times a day (BID) | RESPIRATORY_TRACT | Status: DC
  Administered 2020-04-20 – 2020-04-23 (×7): 15 ug via RESPIRATORY_TRACT
  Filled 2020-04-19 (×8): qty 2

## 2020-04-19 MED ORDER — POTASSIUM CHLORIDE CRYS ER 20 MEQ PO TBCR
20.0000 meq | EXTENDED_RELEASE_TABLET | Freq: Two times a day (BID) | ORAL | Status: DC
  Administered 2020-04-19 – 2020-04-23 (×7): 20 meq via ORAL
  Filled 2020-04-19 (×8): qty 1

## 2020-04-19 MED ORDER — ZOLPIDEM TARTRATE 5 MG PO TABS
5.0000 mg | ORAL_TABLET | Freq: Every evening | ORAL | Status: DC | PRN
  Administered 2020-04-20 – 2020-04-21 (×2): 5 mg via ORAL
  Filled 2020-04-19 (×2): qty 1

## 2020-04-19 MED ORDER — VITAMIN E 45 MG (100 UNIT) PO CAPS
1000.0000 [IU] | ORAL_CAPSULE | Freq: Every day | ORAL | Status: DC
  Administered 2020-04-20 – 2020-04-22 (×3): 1000 [IU] via ORAL
  Filled 2020-04-19 (×4): qty 10

## 2020-04-19 MED ORDER — EZETIMIBE 10 MG PO TABS
10.0000 mg | ORAL_TABLET | Freq: Every day | ORAL | Status: DC
Start: 2020-04-20 — End: 2020-04-23
  Administered 2020-04-20 – 2020-04-23 (×4): 10 mg via ORAL
  Filled 2020-04-19 (×4): qty 1

## 2020-04-19 MED ORDER — HEPARIN (PORCINE) 25000 UT/250ML-% IV SOLN
2000.0000 [IU]/h | INTRAVENOUS | Status: DC
  Administered 2020-04-19: 1100 [IU]/h via INTRAVENOUS
  Administered 2020-04-20: 1450 [IU]/h via INTRAVENOUS
  Administered 2020-04-21: 2000 [IU]/h via INTRAVENOUS
  Administered 2020-04-21: 1800 [IU]/h via INTRAVENOUS
  Administered 2020-04-21: 2100 [IU]/h via INTRAVENOUS
  Administered 2020-04-22: 2000 [IU]/h via INTRAVENOUS
  Filled 2020-04-19 (×8): qty 250

## 2020-04-19 MED ORDER — ASPIRIN EC 81 MG PO TBEC
81.0000 mg | DELAYED_RELEASE_TABLET | Freq: Every day | ORAL | Status: DC
  Administered 2020-04-20 – 2020-04-21 (×2): 81 mg via ORAL
  Filled 2020-04-19 (×3): qty 1

## 2020-04-19 MED ORDER — HYDROCHLOROTHIAZIDE 25 MG PO TABS
25.0000 mg | ORAL_TABLET | ORAL | Status: DC
  Administered 2020-04-21 – 2020-04-23 (×2): 25 mg via ORAL
  Filled 2020-04-19 (×2): qty 1

## 2020-04-19 MED ORDER — ZINC SULFATE 220 (50 ZN) MG PO CAPS
220.0000 mg | ORAL_CAPSULE | Freq: Every day | ORAL | Status: DC
  Administered 2020-04-20 – 2020-04-23 (×4): 220 mg via ORAL
  Filled 2020-04-19 (×4): qty 1

## 2020-04-19 MED ORDER — HEPARIN SODIUM (PORCINE) 5000 UNIT/ML IJ SOLN
4000.0000 [IU] | Freq: Once | INTRAMUSCULAR | Status: AC
  Administered 2020-04-19: 4000 [IU] via INTRAVENOUS
  Filled 2020-04-19: qty 1

## 2020-04-19 MED ORDER — ALBUTEROL SULFATE HFA 108 (90 BASE) MCG/ACT IN AERS
2.0000 | INHALATION_SPRAY | Freq: Four times a day (QID) | RESPIRATORY_TRACT | Status: DC
  Administered 2020-04-19: 2 via RESPIRATORY_TRACT
  Filled 2020-04-19: qty 6.7

## 2020-04-19 MED ORDER — NITROGLYCERIN 0.4 MG SL SUBL: 0.4000 mg | SUBLINGUAL_TABLET | SUBLINGUAL | Status: DC | PRN

## 2020-04-19 MED ORDER — HEPARIN (PORCINE) 25000 UT/250ML-% IV SOLN: 14.0000 [IU]/kg/h | INTRAVENOUS | Status: DC

## 2020-04-19 MED ORDER — VITAMIN D3 25 MCG (1000 UT) PO CAPS: 2.0000 | ORAL_CAPSULE | Freq: Every day | ORAL | Status: DC

## 2020-04-19 MED ORDER — OMEGA-3-ACID ETHYL ESTERS 1 G PO CAPS
1.0000 | ORAL_CAPSULE | Freq: Every day | ORAL | Status: DC
  Administered 2020-04-20 – 2020-04-23 (×3): 1 g via ORAL
  Filled 2020-04-19 (×3): qty 1

## 2020-04-19 NOTE — ED Notes (Signed)
William Elliott son is able to receive health information. [phone]

## 2020-04-19 NOTE — ED Triage Notes (Signed)
Pt c/o of central cp that radiates to bilateral arms and back x 3 weeks. Pt took 4 nitros this morning and a 81 mg ASA. Called cardiologist and was told to come to ED for eval

## 2020-04-19 NOTE — ED Notes (Signed)
Pt requesting ventolin inhaler for SOB . Nurse applied 2 lpm for comfort.

## 2020-04-19 NOTE — Telephone Encounter (Signed)
New message    Pt c/o of Chest Pain: STAT if CP now or developed within 24 hours  1. Are you having CP right now? no  2. Are you experiencing any other symptoms (ex. SOB, nausea, vomiting, sweating)? All of these symptoms   3. How long have you been experiencing CP? For a couple days  4. Is your CP continuous or coming and going? Comes and goes   5. Have you taken Nitroglycerin? 4 of them  ?

## 2020-04-19 NOTE — ED Notes (Signed)
Date and time results received: 04/19/20 1614 (use smartphrase ".now" to insert current time)  Test: troponin Critical Value: >27000  Name of Provider Notified: Zackowski   Orders Received? Or Actions Taken?:

## 2020-04-19 NOTE — ED Notes (Signed)
Patient denies pain and is resting comfortably.  

## 2020-04-19 NOTE — H&P (Signed)
History of Present Illness:  [name] presents with a PMHx of permanent atrial fibrillation not on [facility], CAD s/p 4V CABG - 1999 with LIMA-LAD, SVG-DIAG, SVG-OM, SVG-RPDA and DES to SVG-OM in 2017, HTN, HLD, COPD, history of CVA, history of GI bleeding required blood transfusion who is transferred from [facility] for chest pain evaluation. Patient reports he has intermittent episodes of mid-chest "hurting" feeling in the past week that did not trigger by activity specifically, which resolves on its own. He had another episode of "really bad" pain this morning after he woke up. He describes it as mid-chest "huring" associated with mild dyspnea. The pain radiates to his back, left arm and teeth. He said the pain is worse when he lays flat. The pain lasted for approximately for 50min and  finally resolved after he took 4 SL nitro. He went to local ER for check up. Currently, he is chest pain free, on room air, resting comfortably on bed. He cannot tell this chest pain is similar to the one he had previously prior to his CABG or PCI. Denies fever, chills, dizziness, syncope, lightheadedness, cough, heart palpitations, nausea, vomiting, bendopnea, orthopnea, PND, early satiety, upper abdominal pain, heartburn unrelated to meals, abdominal fullness, dysuria, diarrhea, or any bleeding events.  He says he had positive COVID-19 4 weeks ago and received some breathing treatment. Reports lost 4-5lbs in a month. He says he has chronic lower extremity swelling which did not change recently. He says he usually sleep in bed with 1 pillow.  Relevant workup at OSH K 4.1, Cr 0.98, troponin >27000-->>27000, Hgb 11.5,    Past Medical History: Past Medical History:  Diagnosis Date  . Acute lower GI bleeding 2008  . Anxiety   . Asthma   . Carotid artery disease ([place])    Asymptomatic left carotid bruit  . COPD (chronic obstructive pulmonary disease) ([place])   . Coronary artery disease    a. CABG - 1999 with LIMA-LAD,  SVG-DIAG, SVG-OM, SVG-RPDA  b. Cath in setting of NSTEMI 03/18/2015: patent LIMA-LAD, occluded SVG-RCA, occluded SVG-D1, 99% stenosis of SVG-OM treated w/ DES  . Depression   . Dyslipidemia   . Erectile dysfunction   . Essential hypertension   . History of blood transfusion 2008  . History of stroke   . Hyperlipidemia   . Myocardial infarction [facility])     Past Surgical History: Past Surgical History:  Procedure Laterality Date  . CARDIAC CATHETERIZATION    . CARDIAC CATHETERIZATION N/A 03/18/2015   Procedure: Left Heart Cath and Cors/Grafts Angiography;  Surgeon: Leonie Man, MD;  Location: [place] [facility];  Service: Cardiovascular;  Laterality: N/A;  . CARDIAC CATHETERIZATION N/A 03/18/2015   Procedure: Coronary Stent Intervention;  Surgeon: Leonie Man, MD;  Location: [place] [facility];  Service: Cardiovascular;  Laterality: N/A;  . CATARACT EXTRACTION W/ INTRAOCULAR LENS  IMPLANT, BILATERAL Bilateral   . CORONARY ANGIOPLASTY    . CORONARY ARTERY BYPASS GRAFT  1999  . FRACTURE SURGERY    . HERNIA REPAIR    . [facility]; took bone out of my right hip  . TONSILLECTOMY    . UMBILICAL HERNIA REPAIR  ~ 2000     Home Medications:  Prior to Admission medications   Medication Sig Start Date End Date Taking? Authorizing Provider  ALPRAZolam Duanne Moron) 1 MG tablet Take 1 tablet (1 mg total) by mouth at bedtime as needed for anxiety. 11/01/16  Yes Triplett, Tammy, PA-C  amLODipine (NORVASC) 5 MG tablet TAKE 1 TABLET BY MOUTH DAILY 03/13/19  Yes Satira Sark, MD  aspirin EC 81 MG tablet Take 1 tablet (81 mg total) by mouth daily. 02/12/17  Yes Satira Sark, MD  b complex vitamins tablet Take 1 tablet by mouth daily.   Yes [provider]  benzonatate (TESSALON) 100 MG capsule Take 100 mg by mouth 3 (three) times daily as needed. 03/12/20  Yes [provider]  Cholecalciferol (VITAMIN D3) 25 MCG (1000 UT) CAPS Take 2  capsules by mouth daily.   Yes [provider]  hydrochlorothiazide (HYDRODIURIL) 25 MG tablet TAKE 1 TABLET BY MOUTH EVERY DAY Patient taking differently: Take 25 mg by mouth every other day. 10/27/17  Yes Satira Sark, MD  isosorbide mononitrate (IMDUR) 30 MG 24 hr tablet Take 1 tablet (30 mg total) by mouth in the morning and at bedtime. 04/02/20  Yes Satira Sark, MD  losartan (COZAAR) 50 MG tablet Take 50 mg by mouth daily. 01/20/19  Yes [provider]  multivitamin (THERAGRAN) per tablet Take 1 tablet by mouth daily.   Yes [provider]  nitroGLYCERIN (NITROSTAT) 0.4 MG SL tablet Place 1 tablet (0.4 mg total) under the tongue every 5 (five) minutes x 3 doses as needed for chest pain (if no relief after 3rd dose, proceed to the ED for an evaluation). 04/02/20  Yes Satira Sark, MD  Omega 3 1000 MG CAPS Take 1 capsule by mouth daily.   Yes [provider]  pantoprazole (PROTONIX) 40 MG tablet TAKE 1 TABLET BY MOUTH EVERY DAY 02/09/19  Yes Satira Sark, MD  potassium chloride SA (KLOR-CON) 20 MEQ tablet TAKE 1 TABLET BY MOUTH TWICE DAILY 03/15/20  Yes Satira Sark, MD  Tiotropium Bromide-Olodaterol (STIOLTO RESPIMAT) 2.5-2.5 MCG/ACT AERS Inhale 1 puff into the lungs 2 (two) times a day.   Yes [provider]  VENTOLIN HFA 108 (90 Base) MCG/ACT inhaler Inhale 2 puffs into the lungs 2 (two) times daily as needed. 05/26/18  Yes [provider]  vitamin E 1000 UNIT capsule Take 1,000 Units by mouth daily.   Yes [provider]  ZETIA 10 MG tablet TAKE 1 TABLET BY MOUTH DAILY 03/04/20  Yes Satira Sark, MD  Zinc 50 MG CAPS Take 1 capsule by mouth daily.   Yes [provider]    Inpatient Medications: Scheduled Meds: . albuterol  2 puff Inhalation Q6H   Continuous Infusions: . heparin 1,100 Units/hr (04/19/20 1651)   PRN Meds:   Allergies:    Allergies  Allergen Reactions  . Anoro Ellipta  [Umeclidinium-Vilanterol] Other (See Comments)    Cardiac problems  . Fenofibrate Other (See Comments)    No energy  . Lipitor [Atorvastatin Calcium] Other (See Comments)    Not known   . Niaspan [Niacin] Other (See Comments)    Not known  . Zocor [Simvastatin] Other (See Comments)    Not known    Social History:   Social History   Socioeconomic History  . Marital status: Married    Spouse name: Not on file  . Number of children: Not on file  . Years of education: Not on file  . Highest education level: Not on file  Occupational History  . Occupation: Welder x 10 yrs   Tobacco Use  . Smoking status: Former Smoker    Years: 10.00    Types: Cigars    Quit  date: 03/02/1997    Years since quitting: 23.1  . Smokeless tobacco: Never Used  . Tobacco comment: smoked cigars x 10 yrs, never smoked cigarettes  Substance and Sexual Activity  . Alcohol use: No    Alcohol/week: 0.0 standard drinks    Comment: "drank some when I was young; quit in the 1990s"  . Drug use: No  . Sexual activity: Not Currently  Other Topics Concern  . Not on file  Social History Narrative  . Not on file   Social Determinants of Health   Financial Resource Strain: Not on file  Food Insecurity: Not on file  Transportation Needs: Not on file  Physical Activity: Not on file  Stress: Not on file  Social Connections: Not on file  Intimate Partner Violence: Not on file     Family History:    Family History  Problem Relation Age of Onset  . Heart disease Father   . Heart attack Father   . Hypertension Mother   . Stroke Mother      ROS:  All other ROS reviewed and negative. Pertinent positives noted in the HPI.     Physical Exam/Data:   Vitals:   04/19/20 1815 04/19/20 1830 04/19/20 1900 04/19/20 2000  BP:  136/65 (!) 145/77 138/71  Pulse: (!) 55 (!) 55 (!) 52 (!) 58  Resp: 16 (!) 22 (!) 24 (!) 23  Temp:      TempSrc:      SpO2: 95% 95% 96% 97%  Weight:      Height:       No intake  or output data in the 24 hours ending 04/19/20 2149  Last 3 Weights 04/19/2020 04/19/2020 02/09/2019  Weight (lbs) 205 lb 205 lb 205 lb  Weight (kg) 92.987 kg 92.987 kg 92.987 kg    Body mass index is 29.41 kg/m.  General: Well nourished, well developed, in no acute distress Head: Atraumatic, normal size  Eyes: PEERLA, EOMI  Neck: Supple, no JVD Endocrine: No thryomegaly Cardiac: Normal S1, S2; RRR; no murmurs, rubs, or gallops Lungs: Clear to auscultation bilaterally, no wheezing, rhonchi or rales  Abd: Soft, nontender, no hepatomegaly  Ext: No edema, pulses 2+ Musculoskeletal: No deformities, BUE and BLE strength normal and equal Skin: Warm and dry, no rashes   Neuro: Alert and oriented to person, place, time, and situation, CNII-XII grossly intact, no focal deficits  Psych: Normal mood and affect   EKG:  The EKG was personally reviewed and demonstrates:  Atrial fibrillation  Telemetry:  Telemetry was personally reviewed and demonstrates:  Afib with moderate VR  Relevant CV Studies: tte 12/19/2015 - Left ventricle: The cavity size was normal. There was severe  concentric hypertrophy. Systolic function was vigorous. The  estimated ejection fraction was in the range of 65% to 70%. Wall  motion was normal; there were no regional wall motion  abnormalities. The study was not technically sufficient to allow  evaluation of LV diastolic dysfunction due to atrial  fibrillation.  - Aortic valve: Trileaflet; mildly thickened, mildly calcified  leaflets. Transvalvular velocity was minimally increased. There  was no stenosis. There was mild regurgitation.  - Ascending aorta: The ascending aorta was normal in size.  - Mitral valve: Mildly thickened leaflets . There was mild  regurgitation.  - Left atrium: The atrium was mildly dilated.  - Right ventricle: Systolic function was normal.  - Right atrium: The atrium was normal in size.  - Tricuspid valve: Structurally normal  valve. There was trivial  regurgitation.  - Pulmonic valve: Structurally normal valve.  - Inferior vena cava: The vessel was normal in size.  - Pericardium, extracardiac: There was no pericardial effusion.   LHC 03/18/2015 1. Prox LAD to Mid LAD lesion, 95% stenosed. Mid LAD lesion, 100% stenosed. Ost 2nd Diag to 2nd Diag lesion, 80% stenosed. 2. Ost Cx to Prox Cx lesion, 100% stenosed. 3. Mid RCA to Dist RCA lesion, 100% stenosed. Post Atrio lesion, 100% stenosed. 4. LIMA was injected is large. JR4 Catheter used 5. SVG-RCA & SVG-DIAG 100% occluded 6. Prox Graft lesion SVG-OM, 99% stenosed. Very large - TIMI 1 flow to graft insertion, but no flow in the OM. 7. Following PCI of SVG-OM with distal protection device using Synergy DES, Post intervention, there is a 0% residual stenosis. 8. Mid Cx to Dist Cx lesion, 65% stenosed - not identified until post PCI    Laboratory Data: High Sensitivity Troponin:   Recent Labs  Lab 04/19/20 1432 04/19/20 1629  TROPONINIHS >27,000* >27,000*     Cardiac EnzymesNo results for input(s): TROPONINI in the last 168 hours. No results for input(s): TROPIPOC in the last 168 hours.  Chemistry Recent Labs  Lab 04/19/20 1432  NA 135  K 4.1  CL 105  CO2 23  GLUCOSE 136*  BUN 16  CREATININE 0.98  CALCIUM 9.6  GFRNONAA >60  ANIONGAP 7    No results for input(s): PROT, ALBUMIN, AST, ALT, ALKPHOS, BILITOT in the last 168 hours. Hematology Recent Labs  Lab 04/19/20 1432  WBC 11.4*  RBC 4.28  HGB 11.5*  HCT 37.1*  MCV 86.7  MCH 26.9  MCHC 31.0  RDW 20.9*  PLT 477*   BNPNo results for input(s): BNP, PROBNP in the last 168 hours.  DDimer No results for input(s): DDIMER in the last 168 hours.  Radiology/Studies:  DG Chest 2 View  Result Date: 04/19/2020 CLINICAL DATA:  Chest pain EXAM: CHEST - 2 VIEW COMPARISON:  December 07, 2015 FINDINGS: The cardiomediastinal silhouette is enlarged in contour.Status post median sternotomy and CABG.  Small bilateral pleural effusions. Atherosclerotic calcifications of the aorta. No pneumothorax. Perihilar vascular prominence, peribronchial cuffing, diffuse interstitial prominence and bibasilar predominant heterogeneous opacities. Status post cholecystectomy. Multilevel degenerative changes of the thoracic spine. IMPRESSION: Constellation of findings are favored to reflect pulmonary edema and small bilateral pleural effusions. Differential considerations include atypical infection. Electronically Signed   By: Valentino Saxon MD   On: 04/19/2020 15:06    Assessment and Plan:  [name] presents with a PMHx of permanent atrial fibrillation not on [facility], CAD s/p 4V CABG - 1999 with LIMA-LAD, SVG-DIAG, SVG-OM, SVG-RPDA and DES to SVG-OM in 2017, HTN, HLD, COPD, bilateral carotid artery stenosis, history of CVA, history of GI bleeding required blood transfusion who is transferred from [facility] for chest pain evaluation. Patient presents with atypical angina. Troponin x 2 are significantly elevated. EKGs showed atrial fibrillation without acute ST changes. Currently chest pain free, on room air. He is mildly volume overloaded with warm extremities on exam.  # NSTEMI # History of 4V CABG and PCI -continue Telemetry -continue to trend troponin -EKG prn clinical or telemetric changes -continue ASA and zetia (hx of intolerance of statin medication) -continue heparin gtt -continue losartan 50mg  at bedside -continue imdur 30mg  BID -acquire a TTE  -keep him NPO p MN  #Permanent atrial fibrillation -currently rate controlled, usually asymptomatic -no AC, patient denies -continue to monitor -continue to optimize electrolytes   #HTN -continue Norvasc -continue losartan  For questions or updates, please contact [place] Please consult [email or web address] for contact info under     Time Spent with Patient: I have spent a total of 50 minutes with patient reviewing hospital notes, telemetry,  EKGs, labs and examining the patient as well as establishing an assessment and plan that was discussed with the patient.  > 50% of time was spent in direct patient care.

## 2020-04-19 NOTE — ED Provider Notes (Signed)
Patient states he has been chest pain-free since 7 this morning.  Was had chest pain on and off for the past week.  He said it was pretty severe between 6 and 7 this morning.  He took nitroglycerin and went away.  His troponin is markedly elevated greater than 27,000.  Patient states that he had positive Covid test 4 weeks ago at urgent care.  We do not have anything documented in the system.  He is not symptomatic anymore.  Patient lab findings consistent with non-STEMI.  Will discuss with cardiology.  Will repeat Covid test.  Patient may need to go to Cath Lab sometime in the near future.  Patient states he had a CABG in 1999.  After that he has had a stent placed.  Is not sure of the time.  We will try to check his records.  We will give aspirin and started on heparin.   CRITICAL CARE Performed by: Fredia Sorrow Total critical care time: 35 minutes Critical care time was exclusive of separately billable procedures and treating other patients. Critical care was necessary to treat or prevent imminent or life-threatening deterioration. Critical care was time spent personally by me on the following activities: development of treatment plan with patient and/or surrogate as well as nursing, discussions with consultants, evaluation of patient's response to treatment, examination of patient, obtaining history from patient or surrogate, ordering and performing treatments and interventions, ordering and review of laboratory studies, ordering and review of radiographic studies, pulse oximetry and re-evaluation of patient's condition.    Fredia Sorrow, MD 04/19/20 [phone]

## 2020-04-19 NOTE — Telephone Encounter (Signed)
Reports chest pain this morning rated 9/10. Took nitroglycerin x's 5. Also reports SOB, sweating and nausea. Denies dizziness. Denies symptoms right now. Advised to go to ED now for an evaluation. Verbalized understanding of plan.

## 2020-04-19 NOTE — Progress Notes (Signed)
ANTICOAGULATION CONSULT NOTE - Initial Consult  Pharmacy Consult for heparin gtt  Indication: chest pain/ACS  Allergies  Allergen Reactions  . Anoro Ellipta [Umeclidinium-Vilanterol] Other (See Comments)    Cardiac problems  . Fenofibrate Other (See Comments)    No energy  . Lipitor [Atorvastatin Calcium] Other (See Comments)    Not known   . Niaspan [Niacin] Other (See Comments)    Not known  . Zocor [Simvastatin] Other (See Comments)    Not known    Patient Measurements: Height: 5\' 10"  (177.8 cm) Weight: 93 kg (205 lb) IBW/kg (Calculated) : 73 Heparin Dosing Weight: HEPARIN DW (KG): 91.8   Vital Signs: Temp: 98.7 F (37.1 C) (02/18 1420) Temp Source: Oral (02/18 1420) BP: 133/73 (02/18 1600) Pulse Rate: 57 (02/18 1600)  Labs: Recent Labs    04/19/20 1432  HGB 11.5*  HCT 37.1*  PLT 477*  CREATININE 0.98    Estimated Creatinine Clearance: 71.2 mL/min (by C-G formula based on SCr of 0.98 mg/dL).   Medical History: Past Medical History:  Diagnosis Date  . Acute lower GI bleeding 2008  . Anxiety   . Asthma   . Carotid artery disease ([place])    Asymptomatic left carotid bruit  . COPD (chronic obstructive pulmonary disease) ([place])   . Coronary artery disease    a. CABG - 1999 with LIMA-LAD, SVG-DIAG, SVG-OM, SVG-RPDA  b. Cath in setting of NSTEMI 03/18/2015: patent LIMA-LAD, occluded SVG-RCA, occluded SVG-D1, 99% stenosis of SVG-OM treated w/ DES  . Depression   . Dyslipidemia   . Erectile dysfunction   . Essential hypertension   . History of blood transfusion 2008  . History of stroke   . Hyperlipidemia   . Myocardial infarction ([place])     Medications:  (Not in a hospital admission)  Scheduled:  . aspirin  324 mg Oral Once  . heparin  4,000 Units Intravenous Once   Infusions:  . heparin     PRN:  Anti-infectives (From admission, onward)   None      Assessment: William Elliott a 80 y.o. male requires anticoagulation with a heparin iv infusion  for the indication of  chest pain/ACS. Heparin gtt will be started following pharmacy protocol per pharmacy consult. Patient is not on previous oral anticoagulant that will require aPTT/HL correlation before transitioning to only HL monitoring.   Goal of Therapy:  Heparin level 0.3-0.7 units/ml Monitor platelets by anticoagulation protocol: Yes   Plan:  Give 4000 units bolus x 1 Start heparin infusion at 1100 units/hr Check anti-Xa level in 8 hours and daily while on heparin Continue to monitor H&H and platelets  Heparin level to be drawn in 8 hours for patients >24 years old or crcl < 54ml/min  Donna Christen Dellas Guard 04/19/2020,4:42 PM

## 2020-04-19 NOTE — ED Notes (Signed)
Report given to [facility]. 

## 2020-04-19 NOTE — ED Provider Notes (Signed)
[facility] EMERGENCY DEPARTMENT Provider Note   CSN: [id number] Arrival date & time: 04/19/20  1408     History Chief Complaint  Patient presents with  . Chest Pain    William Elliott is a 80 y.o. male.  HPI   80 year old male with past medical history of HTN, HLD, CAD status post MI with CABG presents the emergency department with chest pain.  Patient states has been going on for the past week.  Is been intermittent, not specifically exertional.  Earlier in the course the chest pain was daily, resolved with nitroglycerin.  He went about 2 days with no pain at all however when he woke up this morning he had midsternal chest heaviness and pain that radiated to his bilateral shoulders.  He took 4 sublingual nitroglycerin and finally the pain relieved itself.  He did take a baby aspirin this morning.  Currently he is chest pain-free, denies any shortness of breath, fever, cough, unusual swelling of his lower extremities.  States he is otherwise been compliant with his medications.  Past Medical History:  Diagnosis Date  . Acute lower GI bleeding 2008  . Anxiety   . Asthma   . Carotid artery disease ([place])    Asymptomatic left carotid bruit  . COPD (chronic obstructive pulmonary disease) ([place])   . Coronary artery disease    a. CABG - 1999 with LIMA-LAD, SVG-DIAG, SVG-OM, SVG-RPDA  b. Cath in setting of NSTEMI 03/18/2015: patent LIMA-LAD, occluded SVG-RCA, occluded SVG-D1, 99% stenosis of SVG-OM treated w/ DES  . Depression   . Dyslipidemia   . Erectile dysfunction   . Essential hypertension   . History of blood transfusion 2008  . History of stroke   . Hyperlipidemia   . Myocardial infarction [facility])     Patient Active Problem List   Diagnosis Date Noted  . PAF (paroxysmal atrial fibrillation) ([place]) 12/10/2015  . Aneurysm ([place])   . Transient confusion   . Cerebral thrombosis with cerebral infarction 12/08/2015  . Cerebral embolism with cerebral infarction 12/08/2015  .  Intracerebral hemorrhage 12/08/2015  . Confusion 12/07/2015  . NSTEMI (non-ST elevated myocardial infarction) ([place]) 03/18/2015  . Coronary artery disease involving coronary bypass graft of native heart with unstable angina pectoris ([place])   . ASCVD (arteriosclerotic cardiovascular disease) 03/17/2015  . Crescendo angina ([place]) 03/16/2015  . Essential hypertension 07/27/2014  . Dyspnea 07/26/2014  . Benign hypertensive heart disease without heart failure 08/04/2013  . BPH associated with nocturia 03/17/2011  . Coronary artery disease involving native coronary artery with unstable angina pectoris ([place])   . Hyperlipidemia   . Carotid bruit   . Ischemic heart disease   . Dyslipidemia   . COPD (chronic obstructive pulmonary disease) ([place])     Past Surgical History:  Procedure Laterality Date  . CARDIAC CATHETERIZATION    . CARDIAC CATHETERIZATION N/A 03/18/2015   Procedure: Left Heart Cath and Cors/Grafts Angiography;  Surgeon: Leonie Man, MD;  Location: [facility];  Service: Cardiovascular;  Laterality: N/A;  . CARDIAC CATHETERIZATION N/A 03/18/2015   Procedure: Coronary Stent Intervention;  Surgeon: Leonie Man, MD;  Location: [place] [facility];  Service: Cardiovascular;  Laterality: N/A;  . CATARACT EXTRACTION W/ INTRAOCULAR LENS  IMPLANT, BILATERAL Bilateral   . CORONARY ANGIOPLASTY    . CORONARY ARTERY BYPASS GRAFT  1999  . FRACTURE SURGERY    . HERNIA REPAIR    . [facility]; took bone  out of my right hip  . TONSILLECTOMY    . UMBILICAL HERNIA REPAIR  ~ 2000       Family History  Problem Relation Age of Onset  . Heart disease Father   . Heart attack Father   . Hypertension Mother   . Stroke Mother     Social History   Tobacco Use  . Smoking status: Former Smoker    Years: 10.00    Types: Cigars    Quit date: 03/02/1997    Years since quitting: 23.1  . Smokeless tobacco: Never Used  . Tobacco comment: smoked  cigars x 10 yrs, never smoked cigarettes  Substance Use Topics  . Alcohol use: No    Alcohol/week: 0.0 standard drinks    Comment: "drank some when I was young; quit in the 1990s"  . Drug use: No    Home Medications Prior to Admission medications   Medication Sig Start Date End Date Taking? Authorizing Provider  ALPRAZolam Duanne Moron) 1 MG tablet Take 1 tablet (1 mg total) by mouth at bedtime as needed for anxiety. 11/01/16   Triplett, Tammy, PA-C  amLODipine (NORVASC) 5 MG tablet TAKE 1 TABLET BY MOUTH DAILY 03/13/19   Satira Sark, MD  aspirin EC 81 MG tablet Take 1 tablet (81 mg total) by mouth daily. 02/12/17   Satira Sark, MD  b complex vitamins tablet Take 1 tablet by mouth daily.    [provider]  hydrochlorothiazide (HYDRODIURIL) 25 MG tablet TAKE 1 TABLET BY MOUTH EVERY DAY 10/27/17   Satira Sark, MD  isosorbide mononitrate (IMDUR) 30 MG 24 hr tablet Take 1 tablet (30 mg total) by mouth in the morning and at bedtime. 04/02/20   Satira Sark, MD  losartan (COZAAR) 50 MG tablet Take 50 mg by mouth daily. 01/20/19   [provider]  multivitamin [facility] per tablet Take 1 tablet by mouth daily.      [provider]  nitroGLYCERIN (NITROSTAT) 0.4 MG SL tablet Place 1 tablet (0.4 mg total) under the tongue every 5 (five) minutes x 3 doses as needed for chest pain (if no relief after 3rd dose, proceed to the ED for an evaluation). 04/02/20   Satira Sark, MD  pantoprazole (PROTONIX) 40 MG tablet TAKE 1 TABLET BY MOUTH EVERY DAY 02/09/19   Satira Sark, MD  potassium chloride SA (KLOR-CON) 20 MEQ tablet TAKE 1 TABLET BY MOUTH TWICE DAILY 03/15/20   Satira Sark, MD  Tiotropium Bromide-Olodaterol (STIOLTO RESPIMAT) 2.5-2.5 MCG/ACT AERS Inhale 1 puff into the lungs 2 (two) times a day.    [provider]  VENTOLIN HFA 108 (90 Base) MCG/ACT inhaler Inhale 2 puffs into the lungs 2 (two) times daily as needed. 05/26/18    [provider]  ZETIA 10 MG tablet TAKE 1 TABLET BY MOUTH DAILY 03/04/20   Satira Sark, MD    Allergies    Anoro ellipta [umeclidinium-vilanterol], Fenofibrate, Lipitor [atorvastatin calcium], Niaspan [niacin], and Zocor [simvastatin]  Review of Systems   Review of Systems  Constitutional: Negative for chills and fever.  HENT: Negative for congestion.   Eyes: Negative for visual disturbance.  Respiratory: Negative for cough and shortness of breath.   Cardiovascular: Positive for chest pain. Negative for leg swelling.  Gastrointestinal: Negative for abdominal pain, diarrhea and vomiting.  Genitourinary: Negative for dysuria.  Musculoskeletal: Negative for myalgias.  Skin: Negative for rash.  Neurological: Negative for headaches.    Physical Exam Updated Vital  Signs BP 126/72   Pulse (!) 115   Temp 98.7 F (37.1 C) (Oral)   Resp (!) 22   Ht 5\' 10"  (1.778 m)   Wt 93 kg   SpO2 94%   BMI 29.41 kg/m   Physical Exam Vitals and nursing note reviewed.  Constitutional:      Appearance: Normal appearance.  HENT:     Head: Normocephalic.     Mouth/Throat:     Mouth: Mucous membranes are moist.  Cardiovascular:     Rate and Rhythm: Normal rate.  Pulmonary:     Effort: Pulmonary effort is normal. No respiratory distress.     Breath sounds: Examination of the right-lower field reveals rales. Examination of the left-lower field reveals rales. Rales present.  Abdominal:     Palpations: Abdomen is soft.     Tenderness: There is no abdominal tenderness.  Musculoskeletal:     Right lower leg: Edema present.     Left lower leg: Edema present.  Skin:    General: Skin is warm.  Neurological:     Mental Status: He is alert and oriented to person, place, and time. Mental status is at baseline.  Psychiatric:        Mood and Affect: Mood normal.     ED Results / Procedures / Treatments   Labs (all labs ordered are listed, but only abnormal results are  displayed) Labs Reviewed  BASIC METABOLIC PANEL - Abnormal; Notable for the following components:      Result Value   Glucose, Bld 136 (*)    All other components within normal limits  CBC - Abnormal; Notable for the following components:   WBC 11.4 (*)    Hemoglobin 11.5 (*)    HCT 37.1 (*)    RDW 20.9 (*)    Platelets 477 (*)    All other components within normal limits  TROPONIN I (HIGH SENSITIVITY)    EKG EKG Interpretation  Date/Time:  Friday April 19 2020 14:26:10 EST Ventricular Rate:  79 PR Interval:    QRS Duration: 97 QT Interval:  418 QTC Calculation: 428 R Axis:   89 Text Interpretation: Sinus rhythm Supraventricular bigeminy Short PR interval Borderline right axis deviation Repol abnrm suggests ischemia, lateral leads Sinus rhythm, St changes seen on previous Confirmed by Lavenia Atlas [phone]) on 04/19/2020 2:42:19 PM   Radiology DG Chest 2 View  Result Date: 04/19/2020 CLINICAL DATA:  Chest pain EXAM: CHEST - 2 VIEW COMPARISON:  December 07, 2015 FINDINGS: The cardiomediastinal silhouette is enlarged in contour.Status post median sternotomy and CABG. Small bilateral pleural effusions. Atherosclerotic calcifications of the aorta. No pneumothorax. Perihilar vascular prominence, peribronchial cuffing, diffuse interstitial prominence and bibasilar predominant heterogeneous opacities. Status post cholecystectomy. Multilevel degenerative changes of the thoracic spine. IMPRESSION: Constellation of findings are favored to reflect pulmonary edema and small bilateral pleural effusions. Differential considerations include atypical infection. Electronically Signed   By: Valentino Saxon MD   On: 04/19/2020 15:06    Procedures Procedures   Medications Ordered in ED Medications - No data to display  ED Course  I have reviewed the triage vital signs and the nursing notes.  Pertinent labs & imaging results that were available during my care of the patient were reviewed by  me and considered in my medical decision making (see chart for details).    MDM Rules/Calculators/A&P  80 year old male presents the emergency department concern for chest pain.  Currently chest pain is resolved however this is after the patient took 4 sublingual nitroglycerin.  Arrival EKG shows nonspecific ST changes that is not acutely changed from previous, no STEMI.  Cardiac work-up has been initiated, my highest suspicion is for ACS.  Lower suspicion for dissection given the duration of symptoms and pattern and low suspicion for PE at this time.  I anticipate admission for the patient given his high heart score, comorbidities and concerning story with the chest pain being relieved with nitroglycerin.  Patient signed out to oncoming provider pending labs, chest x-ray and further evaluation.  Patient stable at time of signout.  Final Clinical Impression(s) / ED Diagnoses Final diagnoses:  None    Rx / DC Orders ED Discharge Orders    None       Lorelle Gibbs, DO 04/19/20 1545

## 2020-04-20 ENCOUNTER — Inpatient Hospital Stay (HOSPITAL_COMMUNITY): Payer: Medicare Other

## 2020-04-20 DIAGNOSIS — I10 Essential (primary) hypertension: Secondary | ICD-10-CM

## 2020-04-20 DIAGNOSIS — I4821 Permanent atrial fibrillation: Secondary | ICD-10-CM | POA: Diagnosis not present

## 2020-04-20 DIAGNOSIS — I214 Non-ST elevation (NSTEMI) myocardial infarction: Principal | ICD-10-CM

## 2020-04-20 LAB — ECHOCARDIOGRAM COMPLETE
AR max vel: 2.25 cm2
AV Area VTI: 2.21 cm2
AV Area mean vel: 2.05 cm2
AV Mean grad: 6 mmHg
AV Peak grad: 10.9 mmHg
Ao pk vel: 1.65 m/s
Height: 70 in
MV M vel: 4.49 m/s
MV Peak grad: 80.6 mmHg
S' Lateral: 3.1 cm
Weight: 3280 oz

## 2020-04-20 LAB — BASIC METABOLIC PANEL
Anion gap: 9 (ref 5–15)
BUN: 10 mg/dL (ref 8–23)
CO2: 24 mmol/L (ref 22–32)
Calcium: 8.6 mg/dL — ABNORMAL LOW (ref 8.9–10.3)
Chloride: 101 mmol/L (ref 98–111)
Creatinine, Ser: 0.86 mg/dL (ref 0.61–1.24)
GFR, Estimated: >60 mL/min (ref >60)
Glucose, Bld: 124 mg/dL — ABNORMAL HIGH (ref 70–99)
Potassium: 4.2 mmol/L (ref 3.5–5.1)
Sodium: 134 mmol/L — ABNORMAL LOW (ref 135–145)

## 2020-04-20 LAB — HEPARIN LEVEL (UNFRACTIONATED)
Heparin Unfractionated: <0.1 IU/mL — ABNORMAL LOW (ref 0.30–0.70)
Heparin Unfractionated: 0.22 IU/mL — ABNORMAL LOW (ref 0.30–0.70)
Heparin Unfractionated: 0.27 IU/mL — ABNORMAL LOW (ref 0.30–0.70)

## 2020-04-20 LAB — TROPONIN I (HIGH SENSITIVITY)
Troponin I (High Sensitivity): >27000 ng/L (ref <18)
Troponin I (High Sensitivity): 18037 ng/L (ref <18)

## 2020-04-20 LAB — BRAIN NATRIURETIC PEPTIDE: B Natriuretic Peptide: 662.3 pg/mL — ABNORMAL HIGH (ref 0.0–100.0)

## 2020-04-20 LAB — MAGNESIUM: Magnesium: 1.9 mg/dL (ref 1.7–2.4)

## 2020-04-20 LAB — MRSA PCR SCREENING: MRSA by PCR: NEGATIVE

## 2020-04-20 MED ORDER — PERFLUTREN LIPID MICROSPHERE
1.0000 mL | INTRAVENOUS | Status: AC | PRN
  Administered 2020-04-20: 5 mL via INTRAVENOUS
  Filled 2020-04-20: qty 10

## 2020-04-20 MED ORDER — METOPROLOL SUCCINATE ER 25 MG PO TB24
25.0000 mg | ORAL_TABLET | Freq: Every day | ORAL | Status: DC
  Administered 2020-04-20 – 2020-04-23 (×4): 25 mg via ORAL
  Filled 2020-04-20 (×4): qty 1

## 2020-04-20 MED ORDER — ALPRAZOLAM 0.5 MG PO TABS
1.0000 mg | ORAL_TABLET | Freq: Two times a day (BID) | ORAL | Status: DC | PRN
  Administered 2020-04-20 – 2020-04-22 (×5): 1 mg via ORAL
  Filled 2020-04-20 (×5): qty 2

## 2020-04-20 MED ORDER — HEPARIN BOLUS VIA INFUSION
2700.0000 [IU] | Freq: Once | INTRAVENOUS | Status: AC
  Administered 2020-04-20: 2700 [IU] via INTRAVENOUS
  Filled 2020-04-20: qty 2700

## 2020-04-20 MED ORDER — ALPRAZOLAM 0.25 MG PO TABS: 0.2500 mg | ORAL_TABLET | Freq: Two times a day (BID) | ORAL | Status: DC | PRN

## 2020-04-20 NOTE — Progress Notes (Signed)
ANTICOAGULATION CONSULT NOTE - heparin  Pharmacy Consult for heparin gtt  Indication: chest pain/ACS  Allergies  Allergen Reactions  . Anoro Ellipta [Umeclidinium-Vilanterol] Other (See Comments)    Cardiac problems  . Fenofibrate Other (See Comments)    No energy  . Lipitor [Atorvastatin Calcium] Other (See Comments)    Not known   . Niaspan [Niacin] Other (See Comments)    Not known  . Zocor [Simvastatin] Other (See Comments)    Not known    Patient Measurements: Height: 5\' 10"  (177.8 cm) Weight: 93 kg (205 lb) IBW/kg (Calculated) : 73 Heparin Dosing Weight: HEPARIN DW (KG): 91.8   Vital Signs: Temp: 98.6 F (37 C) (02/19 1900) Temp Source: Oral (02/19 1900) BP: 135/66 (02/19 1900) Pulse Rate: 65 (02/19 1900)  Labs: Recent Labs    04/19/20 1432 04/20/20 0043 04/20/20 0619 04/20/20 1009 04/20/20 1842  HGB 11.5*  --   --   --   --   HCT 37.1*  --   --   --   --   PLT 477*  --   --   --   --   HEPARINUNFRC  --  <0.10*  --  0.22* 0.27*  CREATININE 0.98  --  0.86  --   --     Estimated Creatinine Clearance: 81.1 mL/min (by C-G formula based on SCr of 0.86 mg/dL).   Medical History: Past Medical History:  Diagnosis Date  . Acute lower GI bleeding 2008  . Anxiety   . Asthma   . Carotid artery disease ([place])    Asymptomatic left carotid bruit  . COPD (chronic obstructive pulmonary disease) ([place])   . Coronary artery disease    a. CABG - 1999 with LIMA-LAD, SVG-DIAG, SVG-OM, SVG-RPDA  b. Cath in setting of NSTEMI 03/18/2015: patent LIMA-LAD, occluded SVG-RCA, occluded SVG-D1, 99% stenosis of SVG-OM treated w/ DES  . Depression   . Dyslipidemia   . Erectile dysfunction   . Essential hypertension   . History of blood transfusion 2008  . History of stroke   . Hyperlipidemia   . Myocardial infarction [facility])     Medications:  Medications Prior to Admission  Medication Sig Dispense Refill Last Dose  . ALPRAZolam (XANAX) 1 MG tablet Take 1 tablet (1 mg total)  by mouth at bedtime as needed for anxiety. (Patient taking differently: Take 1 mg by mouth 2 (two) times daily as needed for anxiety.) 10 tablet 0 04/18/2020 at Unknown time  . amLODipine (NORVASC) 5 MG tablet TAKE 1 TABLET BY MOUTH DAILY 90 tablet 2 04/19/2020 at Unknown time  . aspirin EC 81 MG tablet Take 1 tablet (81 mg total) by mouth daily. 90 tablet 3 04/18/2020 at Unknown time  . b complex vitamins tablet Take 1 tablet by mouth daily.   04/19/2020 at Unknown time  . benzonatate (TESSALON) 100 MG capsule Take 100 mg by mouth 3 (three) times daily as needed.   Past Month at Unknown time  . Cholecalciferol (VITAMIN D3) 25 MCG (1000 UT) CAPS Take 2 capsules by mouth daily.   04/19/2020 at Unknown time  . hydrochlorothiazide (HYDRODIURIL) 25 MG tablet TAKE 1 TABLET BY MOUTH EVERY DAY (Patient taking differently: Take 25 mg by mouth every other day.) 90 tablet 1 04/19/2020 at Unknown time  . isosorbide mononitrate (IMDUR) 30 MG 24 hr tablet Take 1 tablet (30 mg total) by mouth in the morning and at bedtime. 60 tablet 3 04/19/2020 at Unknown time  . losartan (COZAAR) 50 MG  tablet Take 50 mg by mouth daily.   04/19/2020 at Unknown time  . multivitamin (THERAGRAN) per tablet Take 1 tablet by mouth daily.   04/19/2020 at Unknown time  . nitroGLYCERIN (NITROSTAT) 0.4 MG SL tablet Place 1 tablet (0.4 mg total) under the tongue every 5 (five) minutes x 3 doses as needed for chest pain (if no relief after 3rd dose, proceed to the ED for an evaluation). 25 tablet 3 04/19/2020 at Unknown time  . Omega 3 1000 MG CAPS Take 1 capsule by mouth daily.   04/19/2020 at Unknown time  . pantoprazole (PROTONIX) 40 MG tablet TAKE 1 TABLET BY MOUTH EVERY DAY 90 tablet 1 04/19/2020 at Unknown time  . potassium chloride SA (KLOR-CON) 20 MEQ tablet TAKE 1 TABLET BY MOUTH TWICE DAILY 60 tablet 6 04/19/2020 at Unknown time  . Tiotropium Bromide-Olodaterol (STIOLTO RESPIMAT) 2.5-2.5 MCG/ACT AERS Inhale 1 puff into the lungs 2 (two) times a  day.   04/19/2020 at Unknown time  . VENTOLIN HFA 108 (90 Base) MCG/ACT inhaler Inhale 2 puffs into the lungs 2 (two) times daily as needed.   04/19/2020 at Unknown time  . vitamin E 1000 UNIT capsule Take 1,000 Units by mouth daily.   04/19/2020 at Unknown time  . ZETIA 10 MG tablet TAKE 1 TABLET BY MOUTH DAILY 30 tablet 6 04/19/2020 at Unknown time  . Zinc 50 MG CAPS Take 1 capsule by mouth daily.   04/19/2020 at Unknown time   Scheduled:  . amLODipine  5 mg Oral Daily  . arformoterol  15 mcg Nebulization BID  . aspirin EC  81 mg Oral Daily  . ezetimibe  10 mg Oral Daily  . [START ON 04/21/2020] hydrochlorothiazide  25 mg Oral QODAY  . isosorbide mononitrate  30 mg Oral BID  . losartan  50 mg Oral QHS  . metoprolol succinate  25 mg Oral Daily  . multivitamin with minerals  1 tablet Oral Daily  . omega-3 acid ethyl esters  1 capsule Oral Daily  . pantoprazole  40 mg Oral Daily  . potassium chloride SA  20 mEq Oral BID  . vitamin E  1,000 Units Oral Daily  . zinc sulfate  220 mg Oral Daily   Infusions:  . heparin 1,650 Units/hr (04/20/20 1600)   PRN:  Anti-infectives (From admission, onward)   None      Assessment: William Elliott a 80 y.o. male requires anticoagulation with a heparin iv infusion for the indication of  chest pain/ACS. Heparin gtt will be started following pharmacy protocol per pharmacy consult. Patient is not on anticoagulation PTA.   Heparin level remains subtherapeutic at 0.27 on 1650 units/hr.   Goal of Therapy:  Heparin level 0.3-0.7 units/ml Monitor platelets by anticoagulation protocol: Yes   Plan:  -Increase IV heparin to 1800 units/hr  -F/u 8 hr HL -Monitor daily HL, CBC and s/s of bleeding    Albertina Parr, PharmD., BCPS, BCCCP Clinical Pharmacist Please refer to [facility] for unit-specific pharmacist

## 2020-04-20 NOTE — Plan of Care (Signed)

## 2020-04-20 NOTE — Progress Notes (Signed)
ANTICOAGULATION CONSULT NOTE - heparin  Pharmacy Consult for heparin gtt  Indication: chest pain/ACS  Allergies  Allergen Reactions   Anoro Ellipta [Umeclidinium-Vilanterol] Other (See Comments)    Cardiac problems   Fenofibrate Other (See Comments)    No energy   Lipitor [Atorvastatin Calcium] Other (See Comments)    Not known    Niaspan [Niacin] Other (See Comments)    Not known   Zocor [Simvastatin] Other (See Comments)    Not known    Patient Measurements: Height: 5\' 10"  (177.8 cm) Weight: 93 kg (205 lb) IBW/kg (Calculated) : 73 Heparin Dosing Weight: HEPARIN DW (KG): 91.8   Vital Signs: Temp: 98.9 F (37.2 C) (02/19 0716) Temp Source: Oral (02/19 0716) BP: 137/66 (02/19 1101) Pulse Rate: 81 (02/19 0716)  Labs: Recent Labs    04/19/20 1432 04/20/20 0043 04/20/20 0619 04/20/20 1009  HGB 11.5*  --   --   --   HCT 37.1*  --   --   --   PLT 477*  --   --   --   HEPARINUNFRC  --  <0.10*  --  0.22*  CREATININE 0.98  --  0.86  --     Estimated Creatinine Clearance: 81.1 mL/min (by C-G formula based on SCr of 0.86 mg/dL).   Medical History: Past Medical History:  Diagnosis Date   Acute lower GI bleeding 2008   Anxiety    Asthma    Carotid artery disease ([facility])    Asymptomatic left carotid bruit   COPD (chronic obstructive pulmonary disease) ([facility])    Coronary artery disease    a. CABG - 1999 with LIMA-LAD, SVG-DIAG, SVG-OM, SVG-RPDA  b. Cath in setting of NSTEMI 03/18/2015: patent LIMA-LAD, occluded SVG-RCA, occluded SVG-D1, 99% stenosis of SVG-OM treated w/ DES   Depression    Dyslipidemia    Erectile dysfunction    Essential hypertension    History of blood transfusion 2008   History of stroke    Hyperlipidemia    Myocardial infarction [facility])     Medications:  Medications Prior to Admission  Medication Sig Dispense Refill Last Dose   ALPRAZolam (XANAX) 1 MG tablet Take 1 tablet (1 mg total) by mouth at bedtime as needed for  anxiety. (Patient taking differently: Take 1 mg by mouth 2 (two) times daily as needed for anxiety.) 10 tablet 0 04/18/2020 at Unknown time   amLODipine (NORVASC) 5 MG tablet TAKE 1 TABLET BY MOUTH DAILY 90 tablet 2 04/19/2020 at Unknown time   aspirin EC 81 MG tablet Take 1 tablet (81 mg total) by mouth daily. 90 tablet 3 04/18/2020 at Unknown time   b complex vitamins tablet Take 1 tablet by mouth daily.   04/19/2020 at Unknown time   benzonatate (TESSALON) 100 MG capsule Take 100 mg by mouth 3 (three) times daily as needed.   Past Month at Unknown time   Cholecalciferol (VITAMIN D3) 25 MCG (1000 UT) CAPS Take 2 capsules by mouth daily.   04/19/2020 at Unknown time   hydrochlorothiazide (HYDRODIURIL) 25 MG tablet TAKE 1 TABLET BY MOUTH EVERY DAY (Patient taking differently: Take 25 mg by mouth every other day.) 90 tablet 1 04/19/2020 at Unknown time   isosorbide mononitrate (IMDUR) 30 MG 24 hr tablet Take 1 tablet (30 mg total) by mouth in the morning and at bedtime. 60 tablet 3 04/19/2020 at Unknown time   losartan (COZAAR) 50 MG tablet Take 50 mg by mouth daily.   04/19/2020 at Unknown time  multivitamin (THERAGRAN) per tablet Take 1 tablet by mouth daily.   04/19/2020 at Unknown time   nitroGLYCERIN (NITROSTAT) 0.4 MG SL tablet Place 1 tablet (0.4 mg total) under the tongue every 5 (five) minutes x 3 doses as needed for chest pain (if no relief after 3rd dose, proceed to the ED for an evaluation). 25 tablet 3 04/19/2020 at Unknown time   Omega 3 1000 MG CAPS Take 1 capsule by mouth daily.   04/19/2020 at Unknown time   pantoprazole (PROTONIX) 40 MG tablet TAKE 1 TABLET BY MOUTH EVERY DAY 90 tablet 1 04/19/2020 at Unknown time   potassium chloride SA (KLOR-CON) 20 MEQ tablet TAKE 1 TABLET BY MOUTH TWICE DAILY 60 tablet 6 04/19/2020 at Unknown time   Tiotropium Bromide-Olodaterol (STIOLTO RESPIMAT) 2.5-2.5 MCG/ACT AERS Inhale 1 puff into the lungs 2 (two) times a day.   04/19/2020 at Unknown time    VENTOLIN HFA 108 (90 Base) MCG/ACT inhaler Inhale 2 puffs into the lungs 2 (two) times daily as needed.   04/19/2020 at Unknown time   vitamin E 1000 UNIT capsule Take 1,000 Units by mouth daily.   04/19/2020 at Unknown time   ZETIA 10 MG tablet TAKE 1 TABLET BY MOUTH DAILY 30 tablet 6 04/19/2020 at Unknown time   Zinc 50 MG CAPS Take 1 capsule by mouth daily.   04/19/2020 at Unknown time   Scheduled:   amLODipine  5 mg Oral Daily   arformoterol  15 mcg Nebulization BID   aspirin EC  81 mg Oral Daily   ezetimibe  10 mg Oral Daily   [START ON 04/21/2020] hydrochlorothiazide  25 mg Oral QODAY   isosorbide mononitrate  30 mg Oral BID   losartan  50 mg Oral QHS   metoprolol succinate  25 mg Oral Daily   multivitamin with minerals  1 tablet Oral Daily   omega-3 acid ethyl esters  1 capsule Oral Daily   pantoprazole  40 mg Oral Daily   potassium chloride SA  20 mEq Oral BID   vitamin E  1,000 Units Oral Daily   zinc sulfate  220 mg Oral Daily   Infusions:   heparin 1,450 Units/hr (04/20/20 0816)   PRN:  Anti-infectives (From admission, onward)   None      Assessment: 66 yoM admitted with NSTEMI started on IV heparin. Pt has hx permanent AF and GIB (no AC PTA). Cath planned Monday. Heparin level subtherapeutic at 0.22 but trending up, trops trending down.   Goal of Therapy:  Heparin level 0.3-0.7 units/ml Monitor platelets by anticoagulation protocol: Yes   Plan:  Increase heparin infusion to 1650 units/hr Recheck heparin level in 8h   Arrie Senate, PharmD, [place], [facility] Clinical Pharmacist Please check AMION for all [place] numbers 04/20/2020

## 2020-04-20 NOTE — Progress Notes (Signed)
  Echocardiogram 2D Echocardiogram has been performed with Definity.  William Elliott 04/20/2020, 11:55 AM

## 2020-04-20 NOTE — Progress Notes (Signed)
Dr. Alfred Levins, [facility] notified of critical troponin of >27000.  No new orders given at this time.

## 2020-04-20 NOTE — Progress Notes (Signed)
Progress Note  Patient Name: William Elliott Date of Encounter: 04/20/2020  [facility] Cardiologist: [name], MD   Subjective   No more chest pain or SOB.  Inpatient Medications    Scheduled Meds: . amLODipine  5 mg Oral Daily  . arformoterol  15 mcg Nebulization BID  . aspirin EC  81 mg Oral Daily  . ezetimibe  10 mg Oral Daily  . [START ON 04/21/2020] hydrochlorothiazide  25 mg Oral QODAY  . isosorbide mononitrate  30 mg Oral BID  . losartan  50 mg Oral QHS  . multivitamin with minerals  1 tablet Oral Daily  . omega-3 acid ethyl esters  1 capsule Oral Daily  . pantoprazole  40 mg Oral Daily  . potassium chloride SA  20 mEq Oral BID  . vitamin E  1,000 Units Oral Daily  . zinc sulfate  220 mg Oral Daily   Continuous Infusions: . heparin 1,450 Units/hr (04/20/20 0816)   PRN Meds: acetaminophen, albuterol, ALPRAZolam, benzonatate, nitroGLYCERIN, ondansetron (ZOFRAN) IV, zolpidem   Vital Signs    Vitals:   04/19/20 2300 04/20/20 0300 04/20/20 0716 04/20/20 0747  BP: (!) 151/73 117/72 (!) 153/71   Pulse: 63 83 81   Resp: (!) 24 (!) 22 (!) 24   Temp: 97.9 F (36.6 C) 98.6 F (37 C) 98.9 F (37.2 C)   TempSrc: Oral Oral Oral   SpO2: 93% 92% 91% 91%  Weight:      Height:        Intake/Output Summary (Last 24 hours) at 04/20/2020 8937 Last data filed at 04/19/2020 2300 Gross per 24 hour  Intake --  Output 350 ml  Net -350 ml   Last 3 Weights 04/19/2020 04/19/2020 02/09/2019  Weight (lbs) 205 lb 205 lb 205 lb  Weight (kg) 92.987 kg 92.987 kg 92.987 kg      Telemetry    A-fib with ventricular rates in 80', frequent PVCs - Personally Reviewed  ECG    Atrial fibrillation with ST depressions in the inferolateral leads and frequent PVCs- Personally Reviewed  Physical Exam   GEN: No acute distress.   Neck: No JVD Cardiac: iRRR, no murmurs, rubs, or gallops.  Respiratory: Clear to auscultation bilaterally. GI: Soft, nontender, non-distended   MS: No edema; No deformity. Neuro:  Nonfocal  Psych: Normal affect   Labs    High Sensitivity Troponin:   Recent Labs  Lab 04/19/20 1432 04/19/20 1629 04/20/20 0043 04/20/20 0619  TROPONINIHS >27,000* >27,000* >27,000* 18,037*      Chemistry Recent Labs  Lab 04/19/20 1432 04/20/20 0619  NA 135 134*  K 4.1 4.2  CL 105 101  CO2 23 24  GLUCOSE 136* 124*  BUN 16 10  CREATININE 0.98 0.86  CALCIUM 9.6 8.6*  GFRNONAA >60 >60  ANIONGAP 7 9     Hematology Recent Labs  Lab 04/19/20 1432  WBC 11.4*  RBC 4.28  HGB 11.5*  HCT 37.1*  MCV 86.7  MCH 26.9  MCHC 31.0  RDW 20.9*  PLT 477*    BNP Recent Labs  Lab 04/20/20 0619  BNP 662.3*     DDimer No results for input(s): DDIMER in the last 168 hours.   Radiology    DG Chest 2 View  Result Date: 04/19/2020 CLINICAL DATA:  Chest pain EXAM: CHEST - 2 VIEW COMPARISON:  December 07, 2015 FINDINGS: The cardiomediastinal silhouette is enlarged in contour.Status post median sternotomy and CABG. Small bilateral pleural effusions. Atherosclerotic calcifications of the  aorta. No pneumothorax. Perihilar vascular prominence, peribronchial cuffing, diffuse interstitial prominence and bibasilar predominant heterogeneous opacities. Status post cholecystectomy. Multilevel degenerative changes of the thoracic spine. IMPRESSION: Constellation of findings are favored to reflect pulmonary edema and small bilateral pleural effusions. Differential considerations include atypical infection. Electronically Signed   By: Valentino Saxon MD   On: 04/19/2020 15:06    Cardiac Studies     Patient Profile     80 y.o. male with a PMHx of permanent atrial fibrillation not on [facility], CAD s/p 4V CABG - 1999 with LIMA-LAD, SVG-DIAG, SVG-OM, SVG-RPDA and DES to SVG-OM in 2017, HTN, HLD, COPD, bilateral carotid artery stenosis, history of CVA, history of GI bleeding required blood transfusion who is transferred from [facility] for chest pain evaluation.  Patient presents with atypical angina. Troponin x 2 are significantly elevated. EKGs showed atrial fibrillation without acute ST changes. Currently chest pain free, on room air. He is mildly volume overloaded with warm extremities on exam.  Assessment & Plan    # NSTEMI # History of 4V CABG and PCI -continue Telemetry - troponin > 27,000 -> 18,037,  -continue ASA and zetia (hx of intolerance of statin medication) -continue heparin gtt, plan for a cardiac cath on Monday morinng -continue losartan 50mg  at bedside -continue imdur 30mg  BID -acquire a TTE  -keep him NPO on Sunday night for a cath  #Permanent atrial fibrillation - usually asymptomatic -no AC, patient denies because of h/o hematochezia - previous bradycardia, but now HR in 80' and frequent PVCs, I will start low dose toprol XL in the settings of NSTEMI -continue to monitor  #HTN -continue Norvasc -continue losartan  For questions or updates, please contact [facility] Please consult [email or web address] for contact info under     Signed, Ena Dawley, MD  04/20/2020, 9:37 AM

## 2020-04-20 NOTE — Progress Notes (Addendum)
ANTICOAGULATION CONSULT NOTE - heparin  Pharmacy Consult for heparin gtt  Indication: chest pain/ACS  Allergies  Allergen Reactions  . Anoro Ellipta [Umeclidinium-Vilanterol] Other (See Comments)    Cardiac problems  . Fenofibrate Other (See Comments)    No energy  . Lipitor [Atorvastatin Calcium] Other (See Comments)    Not known   . Niaspan [Niacin] Other (See Comments)    Not known  . Zocor [Simvastatin] Other (See Comments)    Not known    Patient Measurements: Height: 5\' 10"  (177.8 cm) Weight: 93 kg (205 lb) IBW/kg (Calculated) : 73 Heparin Dosing Weight: HEPARIN DW (KG): 91.8   Vital Signs: Temp: 97.9 F (36.6 C) (02/18 2300) Temp Source: Oral (02/18 2300) BP: 151/73 (02/18 2300) Pulse Rate: 63 (02/18 2300)  Labs: Recent Labs    04/19/20 1432 04/20/20 0043  HGB 11.5*  --   HCT 37.1*  --   PLT 477*  --   HEPARINUNFRC  --  <0.10*  CREATININE 0.98  --     Estimated Creatinine Clearance: 71.2 mL/min (by C-G formula based on SCr of 0.98 mg/dL).   Medical History: Past Medical History:  Diagnosis Date  . Acute lower GI bleeding 2008  . Anxiety   . Asthma   . Carotid artery disease ([place])    Asymptomatic left carotid bruit  . COPD (chronic obstructive pulmonary disease) ([place])   . Coronary artery disease    a. CABG - 1999 with LIMA-LAD, SVG-DIAG, SVG-OM, SVG-RPDA  b. Cath in setting of NSTEMI 03/18/2015: patent LIMA-LAD, occluded SVG-RCA, occluded SVG-D1, 99% stenosis of SVG-OM treated w/ DES  . Depression   . Dyslipidemia   . Erectile dysfunction   . Essential hypertension   . History of blood transfusion 2008  . History of stroke   . Hyperlipidemia   . Myocardial infarction [facility])     Medications:  Medications Prior to Admission  Medication Sig Dispense Refill Last Dose  . ALPRAZolam (XANAX) 1 MG tablet Take 1 tablet (1 mg total) by mouth at bedtime as needed for anxiety. 10 tablet 0 04/18/2020 at Unknown time  . amLODipine (NORVASC) 5 MG tablet  TAKE 1 TABLET BY MOUTH DAILY 90 tablet 2 04/19/2020 at Unknown time  . aspirin EC 81 MG tablet Take 1 tablet (81 mg total) by mouth daily. 90 tablet 3 04/18/2020 at Unknown time  . b complex vitamins tablet Take 1 tablet by mouth daily.   04/19/2020 at Unknown time  . benzonatate (TESSALON) 100 MG capsule Take 100 mg by mouth 3 (three) times daily as needed.   Past Month at Unknown time  . Cholecalciferol (VITAMIN D3) 25 MCG (1000 UT) CAPS Take 2 capsules by mouth daily.   04/19/2020 at Unknown time  . hydrochlorothiazide (HYDRODIURIL) 25 MG tablet TAKE 1 TABLET BY MOUTH EVERY DAY (Patient taking differently: Take 25 mg by mouth every other day.) 90 tablet 1 04/19/2020 at Unknown time  . isosorbide mononitrate (IMDUR) 30 MG 24 hr tablet Take 1 tablet (30 mg total) by mouth in the morning and at bedtime. 60 tablet 3 04/19/2020 at Unknown time  . losartan (COZAAR) 50 MG tablet Take 50 mg by mouth daily.   04/19/2020 at Unknown time  . multivitamin (THERAGRAN) per tablet Take 1 tablet by mouth daily.   04/19/2020 at Unknown time  . nitroGLYCERIN (NITROSTAT) 0.4 MG SL tablet Place 1 tablet (0.4 mg total) under the tongue every 5 (five) minutes x 3 doses as needed for chest pain (if  no relief after 3rd dose, proceed to the ED for an evaluation). 25 tablet 3 04/19/2020 at Unknown time  . Omega 3 1000 MG CAPS Take 1 capsule by mouth daily.   04/19/2020 at Unknown time  . pantoprazole (PROTONIX) 40 MG tablet TAKE 1 TABLET BY MOUTH EVERY DAY 90 tablet 1 04/19/2020 at Unknown time  . potassium chloride SA (KLOR-CON) 20 MEQ tablet TAKE 1 TABLET BY MOUTH TWICE DAILY 60 tablet 6 04/19/2020 at Unknown time  . Tiotropium Bromide-Olodaterol (STIOLTO RESPIMAT) 2.5-2.5 MCG/ACT AERS Inhale 1 puff into the lungs 2 (two) times a day.   04/19/2020 at Unknown time  . VENTOLIN HFA 108 (90 Base) MCG/ACT inhaler Inhale 2 puffs into the lungs 2 (two) times daily as needed.   04/19/2020 at Unknown time  . vitamin E 1000 UNIT capsule Take  1,000 Units by mouth daily.   04/19/2020 at Unknown time  . ZETIA 10 MG tablet TAKE 1 TABLET BY MOUTH DAILY 30 tablet 6 04/19/2020 at Unknown time  . Zinc 50 MG CAPS Take 1 capsule by mouth daily.   04/19/2020 at Unknown time   Scheduled:  . amLODipine  5 mg Oral Daily  . arformoterol  15 mcg Nebulization BID  . aspirin EC  81 mg Oral Daily  . ezetimibe  10 mg Oral Daily  . [START ON 04/21/2020] hydrochlorothiazide  25 mg Oral QODAY  . isosorbide mononitrate  30 mg Oral BID  . losartan  50 mg Oral QHS  . multivitamin with minerals  1 tablet Oral Daily  . omega-3 acid ethyl esters  1 capsule Oral Daily  . pantoprazole  40 mg Oral Daily  . potassium chloride SA  20 mEq Oral BID  . vitamin E  1,000 Units Oral Daily  . zinc sulfate  220 mg Oral Daily   Infusions:  . heparin 1,100 Units/hr (04/19/20 1651)   PRN:  Anti-infectives (From admission, onward)   None      Assessment: William Elliott a 80 y.o. male requires anticoagulation with a heparin iv infusion for the indication of  chest pain/ACS. Heparin gtt will be started following pharmacy protocol per pharmacy consult. Patient is not on anticoagulation PTA.   2/19 0043 heparin level <0.10 - subtherapeutic after heparin 4000 unit bolus x1 and heparin gtt 1100 units/hr. No issues with infusion or bleeding per Rn.  Goal of Therapy:  Heparin level 0.3-0.7 units/ml Monitor platelets by anticoagulation protocol: Yes   Plan:  Rebolus heparin 2700 units x1 Increase heparin drip to 1450 units/hr  F/u 8hr heparin level Continue to monitor H&H and plt    Durward Fortes, PharmD Student 04/20/2020,1:46 AM

## 2020-04-21 DIAGNOSIS — I4821 Permanent atrial fibrillation: Secondary | ICD-10-CM | POA: Diagnosis not present

## 2020-04-21 DIAGNOSIS — I10 Essential (primary) hypertension: Secondary | ICD-10-CM | POA: Diagnosis not present

## 2020-04-21 DIAGNOSIS — I214 Non-ST elevation (NSTEMI) myocardial infarction: Secondary | ICD-10-CM | POA: Diagnosis not present

## 2020-04-21 LAB — CBC
HCT: 30.7 % — ABNORMAL LOW (ref 39.0–52.0)
Hemoglobin: 10 g/dL — ABNORMAL LOW (ref 13.0–17.0)
MCH: 27.2 pg (ref 26.0–34.0)
MCHC: 32.6 g/dL (ref 30.0–36.0)
MCV: 83.4 fL (ref 80.0–100.0)
Platelets: 372 10*3/uL (ref 150–400)
RBC: 3.68 MIL/uL — ABNORMAL LOW (ref 4.22–5.81)
RDW: 20.5 % — ABNORMAL HIGH (ref 11.5–15.5)
WBC: 12.5 10*3/uL — ABNORMAL HIGH (ref 4.0–10.5)
nRBC: 0 % (ref 0.0–0.2)

## 2020-04-21 LAB — HEPARIN LEVEL (UNFRACTIONATED)
Heparin Unfractionated: 0.15 IU/mL — ABNORMAL LOW (ref 0.30–0.70)
Heparin Unfractionated: 0.64 IU/mL (ref 0.30–0.70)
Heparin Unfractionated: 0.52 IU/mL (ref 0.30–0.70)

## 2020-04-21 MED ORDER — GUAIFENESIN ER 600 MG PO TB12
1200.0000 mg | ORAL_TABLET | Freq: Two times a day (BID) | ORAL | Status: DC
  Administered 2020-04-21 – 2020-04-23 (×5): 1200 mg via ORAL
  Filled 2020-04-21 (×5): qty 2

## 2020-04-21 MED ORDER — HEPARIN BOLUS VIA INFUSION
2000.0000 [IU] | Freq: Once | INTRAVENOUS | Status: AC
  Administered 2020-04-21: 2000 [IU] via INTRAVENOUS
  Filled 2020-04-21: qty 2000

## 2020-04-21 MED ORDER — BENZONATATE 100 MG PO CAPS
200.0000 mg | ORAL_CAPSULE | Freq: Three times a day (TID) | ORAL | Status: DC | PRN
  Administered 2020-04-21 – 2020-04-22 (×3): 200 mg via ORAL
  Filled 2020-04-21 (×3): qty 2

## 2020-04-21 NOTE — Plan of Care (Signed)

## 2020-04-21 NOTE — Progress Notes (Signed)
ANTICOAGULATION CONSULT NOTE - heparin  Pharmacy Consult for heparin gtt  Indication: chest pain/ACS  Allergies  Allergen Reactions  . Anoro Ellipta [Umeclidinium-Vilanterol] Other (See Comments)    Cardiac problems  . Fenofibrate Other (See Comments)    No energy  . Lipitor [Atorvastatin Calcium] Other (See Comments)    Not known   . Niaspan [Niacin] Other (See Comments)    Not known  . Zocor [Simvastatin] Other (See Comments)    Not known    Patient Measurements: Height: 5\' 10"  (177.8 cm) Weight: 93 kg (205 lb) IBW/kg (Calculated) : 73 Heparin Dosing Weight: HEPARIN DW (KG): 91.8   Vital Signs: Temp: 98.1 F (36.7 C) (02/20 1129) Temp Source: Oral (02/20 1129) BP: 135/79 (02/20 1129) Pulse Rate: 66 (02/20 1129)  Labs: Recent Labs    04/19/20 1432 04/20/20 0043 04/20/20 0619 04/20/20 1009 04/20/20 1842 04/21/20 0058 04/21/20 0939  HGB 11.5*  --   --   --   --  10.0*  --   HCT 37.1*  --   --   --   --  30.7*  --   PLT 477*  --   --   --   --  372  --   HEPARINUNFRC  --    < >  --    < > 0.27* 0.15* 0.64  CREATININE 0.98  --  0.86  --   --   --   --    < > = values in this interval not displayed.    Estimated Creatinine Clearance: 81.1 mL/min (by C-G formula based on SCr of 0.86 mg/dL).   Medical History: Past Medical History:  Diagnosis Date  . Acute lower GI bleeding 2008  . Anxiety   . Asthma   . Carotid artery disease ([place])    Asymptomatic left carotid bruit  . COPD (chronic obstructive pulmonary disease) ([place])   . Coronary artery disease    a. CABG - 1999 with LIMA-LAD, SVG-DIAG, SVG-OM, SVG-RPDA  b. Cath in setting of NSTEMI 03/18/2015: patent LIMA-LAD, occluded SVG-RCA, occluded SVG-D1, 99% stenosis of SVG-OM treated w/ DES  . Depression   . Dyslipidemia   . Erectile dysfunction   . Essential hypertension   . History of blood transfusion 2008  . History of stroke   . Hyperlipidemia   . Myocardial infarction [facility])     Medications:   Medications Prior to Admission  Medication Sig Dispense Refill Last Dose  . ALPRAZolam (XANAX) 1 MG tablet Take 1 tablet (1 mg total) by mouth at bedtime as needed for anxiety. (Patient taking differently: Take 1 mg by mouth 2 (two) times daily as needed for anxiety.) 10 tablet 0 04/18/2020 at Unknown time  . amLODipine (NORVASC) 5 MG tablet TAKE 1 TABLET BY MOUTH DAILY 90 tablet 2 04/19/2020 at Unknown time  . aspirin EC 81 MG tablet Take 1 tablet (81 mg total) by mouth daily. 90 tablet 3 04/18/2020 at Unknown time  . b complex vitamins tablet Take 1 tablet by mouth daily.   04/19/2020 at Unknown time  . benzonatate (TESSALON) 100 MG capsule Take 100 mg by mouth 3 (three) times daily as needed.   Past Month at Unknown time  . Cholecalciferol (VITAMIN D3) 25 MCG (1000 UT) CAPS Take 2 capsules by mouth daily.   04/19/2020 at Unknown time  . hydrochlorothiazide (HYDRODIURIL) 25 MG tablet TAKE 1 TABLET BY MOUTH EVERY DAY (Patient taking differently: Take 25 mg by mouth every other day.) 90 tablet 1  04/19/2020 at Unknown time  . isosorbide mononitrate (IMDUR) 30 MG 24 hr tablet Take 1 tablet (30 mg total) by mouth in the morning and at bedtime. 60 tablet 3 04/19/2020 at Unknown time  . losartan (COZAAR) 50 MG tablet Take 50 mg by mouth daily.   04/19/2020 at Unknown time  . multivitamin (THERAGRAN) per tablet Take 1 tablet by mouth daily.   04/19/2020 at Unknown time  . nitroGLYCERIN (NITROSTAT) 0.4 MG SL tablet Place 1 tablet (0.4 mg total) under the tongue every 5 (five) minutes x 3 doses as needed for chest pain (if no relief after 3rd dose, proceed to the ED for an evaluation). 25 tablet 3 04/19/2020 at Unknown time  . Omega 3 1000 MG CAPS Take 1 capsule by mouth daily.   04/19/2020 at Unknown time  . pantoprazole (PROTONIX) 40 MG tablet TAKE 1 TABLET BY MOUTH EVERY DAY 90 tablet 1 04/19/2020 at Unknown time  . potassium chloride SA (KLOR-CON) 20 MEQ tablet TAKE 1 TABLET BY MOUTH TWICE DAILY 60 tablet 6  04/19/2020 at Unknown time  . Tiotropium Bromide-Olodaterol (STIOLTO RESPIMAT) 2.5-2.5 MCG/ACT AERS Inhale 1 puff into the lungs 2 (two) times a day.   04/19/2020 at Unknown time  . VENTOLIN HFA 108 (90 Base) MCG/ACT inhaler Inhale 2 puffs into the lungs 2 (two) times daily as needed.   04/19/2020 at Unknown time  . vitamin E 1000 UNIT capsule Take 1,000 Units by mouth daily.   04/19/2020 at Unknown time  . ZETIA 10 MG tablet TAKE 1 TABLET BY MOUTH DAILY 30 tablet 6 04/19/2020 at Unknown time  . Zinc 50 MG CAPS Take 1 capsule by mouth daily.   04/19/2020 at Unknown time   Scheduled:  . amLODipine  5 mg Oral Daily  . arformoterol  15 mcg Nebulization BID  . aspirin EC  81 mg Oral Daily  . ezetimibe  10 mg Oral Daily  . guaiFENesin  1,200 mg Oral BID  . hydrochlorothiazide  25 mg Oral QODAY  . isosorbide mononitrate  30 mg Oral BID  . losartan  50 mg Oral QHS  . metoprolol succinate  25 mg Oral Daily  . multivitamin with minerals  1 tablet Oral Daily  . omega-3 acid ethyl esters  1 capsule Oral Daily  . pantoprazole  40 mg Oral Daily  . potassium chloride SA  20 mEq Oral BID  . vitamin E  1,000 Units Oral Daily  . zinc sulfate  220 mg Oral Daily   Infusions:  . heparin 2,100 Units/hr (04/21/20 1200)   PRN:  Anti-infectives (From admission, onward)   None      Assessment: William Elliott a 80 y.o. male requires anticoagulation with a heparin iv infusion for the indication of  chest pain/ACS. Heparin gtt will be started following pharmacy protocol per pharmacy consult. Patient is not on anticoagulation PTA.   Heparin level therapeutic but increased significantly from 0.15 to 0.64 after re-bolus 2000 units and increasing drip rate to 2100 units/hr. Looks like heparin was turned off briefly ~20 min prior to drawing HL this morning which may have contributed to low level. Hgb down 11.5>10, pltc 477>372. No overt bleeding or infusion issues per discussion with nursing  Despite therapeutic  level, will decrease drip rate slightly given significant increase in HL.   Goal of Therapy:  Heparin level 0.3-0.7 units/ml Monitor platelets by anticoagulation protocol: Yes   Plan:  -Decrease IV heparin to 2000 units/hr  -F/u 8 hr HL -  Monitor daily HL, CBC and s/s of bleeding   Richardine Service, PharmD, BCPS PGY2 Cardiology Pharmacy Resident Phone: [phone] 04/21/2020  3:14 PM  Please check [email or web address] for unit-specific pharmacy phone numbers.

## 2020-04-21 NOTE — Progress Notes (Signed)
ANTICOAGULATION CONSULT NOTE - Follow Up Consult  Pharmacy Consult for heparin Indication: NSTEMI  Labs: Recent Labs    04/19/20 1432 04/19/20 1629 04/20/20 0043 04/20/20 0043 04/20/20 0619 04/20/20 1009 04/20/20 1842 04/21/20 0058  HGB 11.5*  --   --   --   --   --   --  10.0*  HCT 37.1*  --   --   --   --   --   --  30.7*  PLT 477*  --   --   --   --   --   --  372  HEPARINUNFRC  --   --  <0.10*   < >  --  0.22* 0.27* 0.15*  CREATININE 0.98  --   --   --  0.86  --   --   --   TROPONINIHS >27,000* >27,000* >27,000*  --  18,037*  --   --   --    < > = values in this interval not displayed.    Assessment: 80yo male subtherapeutic on heparin with lower heparin level despite rate increase; no gtt issues or signs of bleeding per RN.  Goal of Therapy:  Heparin level 0.3-0.7 units/ml   Plan:  Will give small heparin bolus of 2000 units and increase heparin gtt by 3 units/kg/hr to 2100 units/hr and check level in 6 hours.    Wynona Neat, PharmD, BCPS  04/21/2020,3:12 AM

## 2020-04-21 NOTE — Progress Notes (Signed)
[place] for heparin gtt  Indication: chest pain/ACS  Allergies  Allergen Reactions  . Anoro Ellipta [Umeclidinium-Vilanterol] Other (See Comments)    Cardiac problems  . Fenofibrate Other (See Comments)    No energy  . Lipitor [Atorvastatin Calcium] Other (See Comments)    Not known   . Niaspan [Niacin] Other (See Comments)    Not known  . Zocor [Simvastatin] Other (See Comments)    Not known    Patient Measurements: Height: 5\' 10"  (177.8 cm) Weight: 93 kg (205 lb) IBW/kg (Calculated) : 73 Heparin Dosing Weight: HEPARIN DW (KG): 91.8   Vital Signs: Temp: 98 F (36.7 C) (02/20 1936) Temp Source: Oral (02/20 1936) BP: 115/63 (02/20 1936) Pulse Rate: 68 (02/20 1936)  Labs: Recent Labs    04/19/20 1432 04/20/20 0043 04/20/20 0619 04/20/20 1009 04/21/20 0058 04/21/20 0939 04/21/20 2233  HGB 11.5*  --   --   --  10.0*  --   --   HCT 37.1*  --   --   --  30.7*  --   --   PLT 477*  --   --   --  372  --   --   HEPARINUNFRC  --    < >  --    < > 0.15* 0.64 0.52  CREATININE 0.98  --  0.86  --   --   --   --    < > = values in this interval not displayed.     Assessment: 80 y.o. male requires anticoagulation with a heparin iv infusion for the indication of  chest pain/ACS. Heparin gtt will be started following pharmacy protocol per pharmacy consult. Patient is not on anticoagulation PTA.   Heparin level therapeutic (0.52) on gtt at 2000 units/hr. No bleeding noted.   Goal of Therapy:  Heparin level 0.3-0.7 units/ml Monitor platelets by anticoagulation protocol: Yes   Plan:  Continue IV heparin at 2000 units/hr  Will f/u daily heparin level and CBC  Sherlon Handing, PharmD, BCPS Please see amion for complete clinical pharmacist phone list 04/21/2020  11:03 PM

## 2020-04-21 NOTE — Progress Notes (Signed)
Progress Note  Patient Name: William Elliott Date of Encounter: 04/21/2020  [facility] Cardiologist: [name], MD   Subjective   No more chest pain or SOB since the admission.   Inpatient Medications    Scheduled Meds: . amLODipine  5 mg Oral Daily  . arformoterol  15 mcg Nebulization BID  . aspirin EC  81 mg Oral Daily  . ezetimibe  10 mg Oral Daily  . hydrochlorothiazide  25 mg Oral QODAY  . isosorbide mononitrate  30 mg Oral BID  . losartan  50 mg Oral QHS  . metoprolol succinate  25 mg Oral Daily  . multivitamin with minerals  1 tablet Oral Daily  . omega-3 acid ethyl esters  1 capsule Oral Daily  . pantoprazole  40 mg Oral Daily  . potassium chloride SA  20 mEq Oral BID  . vitamin E  1,000 Units Oral Daily  . zinc sulfate  220 mg Oral Daily   Continuous Infusions: . heparin 2,100 Units/hr (04/21/20 0800)   PRN Meds: acetaminophen, albuterol, ALPRAZolam, benzonatate, nitroGLYCERIN, ondansetron (ZOFRAN) IV, zolpidem   Vital Signs    Vitals:   04/21/20 0308 04/21/20 0741 04/21/20 0800 04/21/20 0843  BP: 121/72 138/66 128/83   Pulse:  76 69   Resp: (!) 25 18 19    Temp:  97.7 F (36.5 C) 98.3 F (36.8 C)   TempSrc:  Oral Oral   SpO2:  94% 93% 96%  Weight:      Height:        Intake/Output Summary (Last 24 hours) at 04/21/2020 0925 Last data filed at 04/21/2020 0800 Gross per 24 hour  Intake 1253.28 ml  Output 725 ml  Net 528.28 ml   Last 3 Weights 04/19/2020 04/19/2020 02/09/2019  Weight (lbs) 205 lb 205 lb 205 lb  Weight (kg) 92.987 kg 92.987 kg 92.987 kg      Telemetry    A-fib with ventricular rates in 80', frequent PACs, PVCs - Personally Reviewed  ECG    Atrial fibrillation with ST depressions in the inferolateral leads and frequent PVCs- Personally Reviewed  Physical Exam   GEN: No acute distress.   Neck: No JVD Cardiac: iRRR, no murmurs, rubs, or gallops.  Respiratory: Clear to auscultation bilaterally. GI: Soft, nontender,  non-distended  MS: No edema; No deformity. Neuro:  Nonfocal  Psych: Normal affect   Labs    High Sensitivity Troponin:   Recent Labs  Lab 04/19/20 1432 04/19/20 1629 04/20/20 0043 04/20/20 0619  TROPONINIHS >27,000* >27,000* >27,000* 18,037*      Chemistry Recent Labs  Lab 04/19/20 1432 04/20/20 0619  NA 135 134*  K 4.1 4.2  CL 105 101  CO2 23 24  GLUCOSE 136* 124*  BUN 16 10  CREATININE 0.98 0.86  CALCIUM 9.6 8.6*  GFRNONAA >60 >60  ANIONGAP 7 9     Hematology Recent Labs  Lab 04/19/20 1432 04/21/20 0058  WBC 11.4* 12.5*  RBC 4.28 3.68*  HGB 11.5* 10.0*  HCT 37.1* 30.7*  MCV 86.7 83.4  MCH 26.9 27.2  MCHC 31.0 32.6  RDW 20.9* 20.5*  PLT 477* 372    BNP Recent Labs  Lab 04/20/20 0619  BNP 662.3*     DDimer No results for input(s): DDIMER in the last 168 hours.   Radiology    DG Chest 2 View  Result Date: 04/19/2020 CLINICAL DATA:  Chest pain EXAM: CHEST - 2 VIEW COMPARISON:  December 07, 2015 FINDINGS: The cardiomediastinal silhouette is  enlarged in contour.Status post median sternotomy and CABG. Small bilateral pleural effusions. Atherosclerotic calcifications of the aorta. No pneumothorax. Perihilar vascular prominence, peribronchial cuffing, diffuse interstitial prominence and bibasilar predominant heterogeneous opacities. Status post cholecystectomy. Multilevel degenerative changes of the thoracic spine. IMPRESSION: Constellation of findings are favored to reflect pulmonary edema and small bilateral pleural effusions. Differential considerations include atypical infection. Electronically Signed   By: Valentino Saxon MD   On: 04/19/2020 15:06   ECHOCARDIOGRAM COMPLETE  Result Date: 04/20/2020    ECHOCARDIOGRAM REPORT   Patient Name:   William Elliott Date of Exam: 04/20/2020 Medical Rec #:  [id number]       Height:       70.0 in Accession #:    [id number]      Weight:       205.0 lb Date of Birth:  August 17, 1940      BSA:          2.109 m Patient  Age:    80 years        BP:           153/71 mmHg Patient Gender: M               HR:           77 bpm. Exam Location:  Inpatient Procedure: 2D Echo, Cardiac Doppler, Color Doppler and Intracardiac            Opacification Agent Indications:    NSTEMI  History:        Patient has prior history of Echocardiogram examinations, most                 recent 12/09/2015. CAD and Acute MI, Prior CABG, COPD,                 Arrythmias:Atrial Fibrillation; Risk Factors:Hypertension,                 Dyslipidemia and Former Smoker.  Sonographer:    Clayton Lefort RDCS (AE) Referring Phys: [id number] FAN YE IMPRESSIONS  1. Left ventricular ejection fraction, by estimation, is 55 to 60%. The left ventricle has normal function. The left ventricle demonstrates regional wall motion abnormalities (see scoring diagram/findings for description). There is severe left ventricular hypertrophy. Left ventricular diastolic function could not be evaluated. There is moderate hypokinesis of the left ventricular, basal-mid inferior wall.  2. Right ventricular systolic function is low normal. The right ventricular size is normal. Tricuspid regurgitation signal is inadequate for assessing PA pressure.  3. Left atrial size was severely dilated.  4. Right atrial size was mildly dilated.  5. Moderate pleural effusion in the left lateral region.  6. The mitral valve is abnormal. Trivial mitral valve regurgitation.  7. The aortic valve is tricuspid. Aortic valve regurgitation is not visualized. Mild aortic valve sclerosis is present, with no evidence of aortic valve stenosis. Aortic valve mean gradient measures 6.0 mmHg.  8. The inferior vena cava is dilated in size with <50% respiratory variability, suggesting right atrial pressure of 15 mmHg. Comparison(s): Changes from prior study are noted. 12/09/15: LVEF 65-70%, normal wall motion. FINDINGS  Left Ventricle: Left ventricular ejection fraction, by estimation, is 55 to 60%. The left ventricle has normal  function. The left ventricle demonstrates regional wall motion abnormalities. Moderate hypokinesis of the left ventricular, basal-mid inferior  wall. Definity contrast agent was given IV to delineate the left ventricular endocardial borders. The left ventricular internal cavity size was normal in size. There is severe left  ventricular hypertrophy. Left ventricular diastolic function could not be evaluated due to atrial fibrillation. Left ventricular diastolic function could not be evaluated. Right Ventricle: The right ventricular size is normal. No increase in right ventricular wall thickness. Right ventricular systolic function is low normal. Tricuspid regurgitation signal is inadequate for assessing PA pressure. Left Atrium: Left atrial size was severely dilated. Right Atrium: Right atrial size was mildly dilated. Pericardium: There is no evidence of pericardial effusion. Mitral Valve: The mitral valve is abnormal. There is mild thickening of the mitral valve leaflet(s). Mild mitral annular calcification. Trivial mitral valve regurgitation. Tricuspid Valve: The tricuspid valve is grossly normal. Tricuspid valve regurgitation is trivial. Aortic Valve: The aortic valve is tricuspid. Aortic valve regurgitation is not visualized. Mild aortic valve sclerosis is present, with no evidence of aortic valve stenosis. Aortic valve mean gradient measures 6.0 mmHg. Aortic valve peak gradient measures 10.9 mmHg. Aortic valve area, by VTI measures 2.21 cm. Pulmonic Valve: The pulmonic valve was grossly normal. Pulmonic valve regurgitation is trivial. Aorta: The aortic root and ascending aorta are structurally normal, with no evidence of dilitation. Venous: The inferior vena cava is dilated in size with less than 50% respiratory variability, suggesting right atrial pressure of 15 mmHg. IAS/Shunts: There is right bowing of the interatrial septum, suggestive of elevated left atrial pressure. No atrial level shunt detected by color  flow Doppler. Additional Comments: There is a moderate pleural effusion in the left lateral region.  LEFT VENTRICLE PLAX 2D LVIDd:         4.70 cm LVIDs:         3.10 cm LV PW:         2.00 cm LV IVS:        1.80 cm LVOT diam:     2.00 cm LV SV:         75 LV SV Index:   36 LVOT Area:     3.14 cm  RIGHT VENTRICLE            IVC RV Basal diam:  3.70 cm    IVC diam: 2.80 cm RV Mid diam:    4.40 cm RV S prime:     9.25 cm/s TAPSE (M-mode): 1.2 cm LEFT ATRIUM              Index       RIGHT ATRIUM           Index LA diam:        4.60 cm  2.18 cm/m  RA Area:     24.40 cm LA Vol (A2C):   103.0 ml 48.83 ml/m RA Volume:   72.90 ml  34.56 ml/m LA Vol (A4C):   102.0 ml 48.36 ml/m LA Biplane Vol: 102.0 ml 48.36 ml/m  AORTIC VALVE AV Area (Vmax):    2.25 cm AV Area (Vmean):   2.05 cm AV Area (VTI):     2.21 cm AV Vmax:           165.00 cm/s AV Vmean:          117.667 cm/s AV VTI:            0.341 m AV Peak Grad:      10.9 mmHg AV Mean Grad:      6.0 mmHg LVOT Vmax:         118.33 cm/s LVOT Vmean:        76.733 cm/s LVOT VTI:          0.240 m LVOT/AV  VTI ratio: 0.70  AORTA Ao Root diam: 3.50 cm Ao Asc diam:  3.30 cm MR Peak grad: 80.6 mmHg MR Mean grad: 58.0 mmHg   SHUNTS MR Vmax:      449.00 cm/s Systemic VTI:  0.24 m MR Vmean:     367.0 cm/s  Systemic Diam: 2.00 cm Lyman Bishop MD Electronically signed by Lyman Bishop MD Signature Date/Time: 04/20/2020/1:08:51 PM    Final     Cardiac Studies     Patient Profile     80 y.o. male with a PMHx of permanent atrial fibrillation not on [facility], CAD s/p 4V CABG - 1999 with LIMA-LAD, SVG-DIAG, SVG-OM, SVG-RPDA and DES to SVG-OM in 2017, HTN, HLD, COPD, bilateral carotid artery stenosis, history of CVA, history of GI bleeding required blood transfusion who is transferred from [facility] for chest pain evaluation. Patient presents with atypical angina. Troponin x 2 are significantly elevated. EKGs showed atrial fibrillation without acute ST changes. Currently chest pain  free, on room air. He is mildly volume overloaded with warm extremities on exam.  Assessment & Plan    # NSTEMI # History of 4V CABG and PCI -continue Telemetry - troponin > 27,000 -> 18,037,  -continue ASA and zetia (hx of intolerance of statin medication) -continue heparin gtt, plan for a cardiac cath on Monday morning -continue losartan 50mg  at bedside -continue imdur 30mg  BID -Echo showed LVEF 55-60%  -keep him NPO on Sunday night for a cath  #Permanent atrial fibrillation - usually asymptomatic - no AC, patient denies because of h/o hematochezia - previous bradycardia, but now HR in 80' and frequent PVCs, I will start low dose toprol XL in the settings of NSTEMI - continue to monitor  #HTN - well controlled on current regimen -continue Norvasc -continue losartan  For questions or updates, please contact [facility] Please consult [email or web address] for contact info under     Signed, Ena Dawley, MD  04/21/2020, 9:25 AM

## 2020-04-22 ENCOUNTER — Encounter (HOSPITAL_COMMUNITY)
Admission: EM | Disposition: A | Discharge status: Home / Self Care | Payer: Self-pay | Source: Home / Self Care | Attending: Internal Medicine

## 2020-04-22 DIAGNOSIS — I2510 Atherosclerotic heart disease of native coronary artery without angina pectoris: Secondary | ICD-10-CM

## 2020-04-22 DIAGNOSIS — I25810 Atherosclerosis of coronary artery bypass graft(s) without angina pectoris: Secondary | ICD-10-CM

## 2020-04-22 HISTORY — PX: CORONARY STENT INTERVENTION: CATH118234

## 2020-04-22 HISTORY — PX: LEFT HEART CATH AND CORS/GRAFTS ANGIOGRAPHY: CATH118250

## 2020-04-22 LAB — CBC
HCT: 34.8 % — ABNORMAL LOW (ref 39.0–52.0)
Hemoglobin: 10.8 g/dL — ABNORMAL LOW (ref 13.0–17.0)
MCH: 26.3 pg (ref 26.0–34.0)
MCHC: 31 g/dL (ref 30.0–36.0)
MCV: 84.9 fL (ref 80.0–100.0)
Platelets: 340 10*3/uL (ref 150–400)
RBC: 4.1 MIL/uL — ABNORMAL LOW (ref 4.22–5.81)
RDW: 20.6 % — ABNORMAL HIGH (ref 11.5–15.5)
WBC: 9.6 10*3/uL (ref 4.0–10.5)
nRBC: 0 % (ref 0.0–0.2)

## 2020-04-22 LAB — POCT ACTIVATED CLOTTING TIME: Activated Clotting Time: 338 seconds

## 2020-04-22 LAB — HEPARIN LEVEL (UNFRACTIONATED): Heparin Unfractionated: 0.45 IU/mL (ref 0.30–0.70)

## 2020-04-22 SURGERY — LEFT HEART CATH AND CORS/GRAFTS ANGIOGRAPHY
Anesthesia: LOCAL

## 2020-04-22 MED ORDER — SODIUM CHLORIDE 0.9% FLUSH: 3.0000 mL | Freq: Two times a day (BID) | INTRAVENOUS | Status: DC

## 2020-04-22 MED ORDER — FAMOTIDINE IN NACL 20-0.9 MG/50ML-% IV SOLN
INTRAVENOUS | Status: AC
  Filled 2020-04-22: qty 50

## 2020-04-22 MED ORDER — NITROGLYCERIN IN D5W 200-5 MCG/ML-% IV SOLN: 10.0000 ug/min | INTRAVENOUS | Status: DC

## 2020-04-22 MED ORDER — CLOPIDOGREL BISULFATE 300 MG PO TABS
ORAL_TABLET | ORAL | Status: DC | PRN
  Administered 2020-04-22: 600 mg via ORAL

## 2020-04-22 MED ORDER — HEPARIN SODIUM (PORCINE) 5000 UNIT/ML IJ SOLN
5000.0000 [IU] | Freq: Three times a day (TID) | INTRAMUSCULAR | Status: DC
  Filled 2020-04-22: qty 1

## 2020-04-22 MED ORDER — SODIUM CHLORIDE 0.9% FLUSH: 3.0000 mL | INTRAVENOUS | Status: DC | PRN

## 2020-04-22 MED ORDER — LIDOCAINE HCL (PF) 1 % IJ SOLN
INTRAMUSCULAR | Status: AC
  Filled 2020-04-22: qty 30

## 2020-04-22 MED ORDER — HYDRALAZINE HCL 20 MG/ML IJ SOLN: 10.0000 mg | INTRAMUSCULAR | Status: AC | PRN

## 2020-04-22 MED ORDER — SODIUM CHLORIDE 0.9 % IV SOLN
INTRAVENOUS | Status: DC | PRN
  Administered 2020-04-22: 1.75 mg/kg/h via INTRAVENOUS

## 2020-04-22 MED ORDER — SODIUM CHLORIDE 0.9 % WEIGHT BASED INFUSION
1.0000 mL/kg/h | INTRAVENOUS | Status: DC
  Administered 2020-04-22: 1 mL/kg/h via INTRAVENOUS

## 2020-04-22 MED ORDER — FAMOTIDINE IN NACL 20-0.9 MG/50ML-% IV SOLN
INTRAVENOUS | Status: AC | PRN
  Administered 2020-04-22: 20 mg via INTRAVENOUS

## 2020-04-22 MED ORDER — SODIUM CHLORIDE 0.9 % IV SOLN: 250.0000 mL | INTRAVENOUS | Status: DC | PRN

## 2020-04-22 MED ORDER — LABETALOL HCL 5 MG/ML IV SOLN: 10.0000 mg | INTRAVENOUS | Status: AC | PRN

## 2020-04-22 MED ORDER — ASPIRIN 81 MG PO CHEW
81.0000 mg | CHEWABLE_TABLET | Freq: Every day | ORAL | Status: DC
  Administered 2020-04-23: 81 mg via ORAL
  Filled 2020-04-22: qty 1

## 2020-04-22 MED ORDER — HEPARIN (PORCINE) IN NACL 1000-0.9 UT/500ML-% IV SOLN
INTRAVENOUS | Status: AC
  Filled 2020-04-22: qty 1000

## 2020-04-22 MED ORDER — ONDANSETRON HCL 4 MG/2ML IJ SOLN: 4.0000 mg | Freq: Four times a day (QID) | INTRAMUSCULAR | Status: DC | PRN

## 2020-04-22 MED ORDER — VERAPAMIL HCL 2.5 MG/ML IV SOLN
INTRAVENOUS | Status: AC
  Filled 2020-04-22: qty 2

## 2020-04-22 MED ORDER — MIDAZOLAM HCL 2 MG/2ML IJ SOLN
INTRAMUSCULAR | Status: DC | PRN
  Administered 2020-04-22 (×2): 0.5 mg via INTRAVENOUS

## 2020-04-22 MED ORDER — VERAPAMIL HCL 2.5 MG/ML IV SOLN
INTRAVENOUS | Status: DC | PRN
Start: 2020-04-22 — End: 2020-04-22
  Administered 2020-04-22 (×2): 100 ug via INTRACORONARY

## 2020-04-22 MED ORDER — BIVALIRUDIN BOLUS VIA INFUSION - CUPID
INTRAVENOUS | Status: DC | PRN
  Administered 2020-04-22: 69.75 mg via INTRAVENOUS

## 2020-04-22 MED ORDER — SODIUM CHLORIDE 0.9% FLUSH
3.0000 mL | Freq: Two times a day (BID) | INTRAVENOUS | Status: DC
  Administered 2020-04-22 – 2020-04-23 (×2): 3 mL via INTRAVENOUS

## 2020-04-22 MED ORDER — SODIUM CHLORIDE 0.9 % IV SOLN: INTRAVENOUS | Status: AC

## 2020-04-22 MED ORDER — ACETAMINOPHEN 325 MG PO TABS: 650.0000 mg | ORAL_TABLET | ORAL | Status: DC | PRN

## 2020-04-22 MED ORDER — OXYCODONE HCL 5 MG PO TABS: 5.0000 mg | ORAL_TABLET | ORAL | Status: DC | PRN

## 2020-04-22 MED ORDER — HEPARIN (PORCINE) IN NACL 1000-0.9 UT/500ML-% IV SOLN
INTRAVENOUS | Status: DC | PRN
  Administered 2020-04-22 (×3): 500 mL

## 2020-04-22 MED ORDER — ASPIRIN 81 MG PO CHEW
81.0000 mg | CHEWABLE_TABLET | ORAL | Status: AC
  Administered 2020-04-22: 81 mg via ORAL
  Filled 2020-04-22: qty 1

## 2020-04-22 MED ORDER — NITROGLYCERIN IN D5W 200-5 MCG/ML-% IV SOLN
10.0000 ug/min | INTRAVENOUS | Status: AC
  Administered 2020-04-22: 10 ug/min via INTRAVENOUS

## 2020-04-22 MED ORDER — MIDAZOLAM HCL 2 MG/2ML IJ SOLN
INTRAMUSCULAR | Status: AC
  Filled 2020-04-22: qty 2

## 2020-04-22 MED ORDER — IOHEXOL 350 MG/ML SOLN
INTRAVENOUS | Status: AC
  Filled 2020-04-22: qty 1

## 2020-04-22 MED ORDER — NITROGLYCERIN IN D5W 200-5 MCG/ML-% IV SOLN
INTRAVENOUS | Status: AC | PRN
  Administered 2020-04-22: 10 ug/min via INTRAVENOUS

## 2020-04-22 MED ORDER — CLOPIDOGREL BISULFATE 300 MG PO TABS
ORAL_TABLET | ORAL | Status: AC
  Filled 2020-04-22: qty 2

## 2020-04-22 MED ORDER — BIVALIRUDIN TRIFLUOROACETATE 250 MG IV SOLR
INTRAVENOUS | Status: AC
  Filled 2020-04-22: qty 250

## 2020-04-22 MED ORDER — FENTANYL CITRATE (PF) 100 MCG/2ML IJ SOLN
INTRAMUSCULAR | Status: DC | PRN
  Administered 2020-04-22 (×3): 25 ug via INTRAVENOUS

## 2020-04-22 MED ORDER — IOHEXOL 350 MG/ML SOLN
INTRAVENOUS | Status: DC | PRN
  Administered 2020-04-22: 160 mL

## 2020-04-22 MED ORDER — LIDOCAINE HCL (PF) 1 % IJ SOLN
INTRAMUSCULAR | Status: DC | PRN
  Administered 2020-04-22: 10 mL

## 2020-04-22 MED ORDER — SODIUM CHLORIDE 0.9 % WEIGHT BASED INFUSION: 3.0000 mL/kg/h | INTRAVENOUS | Status: DC

## 2020-04-22 MED ORDER — FENTANYL CITRATE (PF) 100 MCG/2ML IJ SOLN
INTRAMUSCULAR | Status: AC
  Filled 2020-04-22: qty 2

## 2020-04-22 MED ORDER — NITROGLYCERIN IN D5W 200-5 MCG/ML-% IV SOLN
INTRAVENOUS | Status: AC
  Filled 2020-04-22: qty 250

## 2020-04-22 MED ORDER — CLOPIDOGREL BISULFATE 75 MG PO TABS
75.0000 mg | ORAL_TABLET | Freq: Every day | ORAL | Status: DC
  Administered 2020-04-23: 75 mg via ORAL
  Filled 2020-04-22: qty 1

## 2020-04-22 SURGICAL SUPPLY — 24 items
BALLN SAPPHIRE 3.0X12 (BALLOONS) ×2
BALLOON SAPPHIRE 3.0X12 (BALLOONS) IMPLANT
CATH INFINITI 5 FR IM (CATHETERS) ×1 IMPLANT
CATH INFINITI 5FR MPB2 (CATHETERS) ×1 IMPLANT
CATH LAUNCHER 6FR AL1 (CATHETERS) IMPLANT
CATH VISTA GUIDE 6FR IM 90 CM (CATHETERS) ×1 IMPLANT
CATHETER LAUNCHER 6FR AL1 (CATHETERS) ×2
CATHETER LAUNCHER 6FR LCB (CATHETERS) ×1 IMPLANT
DEVICE SPIDERFX EMB PROT 4MM (WIRE) ×1 IMPLANT
GLIDESHEATH SLEND A-KIT 6F 22G (SHEATH) IMPLANT
GUIDEWIRE ANGLED .035X150CM (WIRE) ×1 IMPLANT
GUIDEWIRE INQWIRE 1.5J.035X260 (WIRE) IMPLANT
INQWIRE 1.5J .035X260CM (WIRE)
KIT ENCORE 26 ADVANTAGE (KITS) ×1 IMPLANT
KIT HEART LEFT (KITS) ×2 IMPLANT
PACK CARDIAC CATHETERIZATION (CUSTOM PROCEDURE TRAY) ×2 IMPLANT
SHEATH PINNACLE 5F 10CM (SHEATH) ×1 IMPLANT
SHEATH PINNACLE 6F 10CM (SHEATH) ×1 IMPLANT
SHEATH PROBE COVER 6X72 (BAG) ×1 IMPLANT
STENT RESOLUTE ONYX 4.0X22 (Permanent Stent) ×1 IMPLANT
TRANSDUCER W/STOPCOCK (MISCELLANEOUS) ×2 IMPLANT
TUBING CIL FLEX 10 FLL-RA (TUBING) ×2 IMPLANT
WIRE ASAHI PROWATER 180CM (WIRE) ×2 IMPLANT
WIRE EMERALD 3MM-J .035X150CM (WIRE) ×1 IMPLANT

## 2020-04-22 NOTE — Plan of Care (Signed)

## 2020-04-22 NOTE — Progress Notes (Signed)
Upon shift change, patient found to be sitting at the edge of the bed. This RN educated patient that he needed to be lying down due to sheath being pulled recently. Pt very uncooperative and needed constant reinforcement to lay down. Pt educated about the importance of bedrest. Pt angry at times and cursing at this RN. VSS, groin site level 0. Will continue to monitor the patient.

## 2020-04-22 NOTE — CV Procedure (Signed)
   SVG to obtuse marginal 99% stenosed in the mid vessel beyond the previously placed stent.  Severe disease in the antegrade limb of the obtuse marginal.  Retrograde filling to the entirety of the circumflex distally.  Occluded SVG to PDA.  Occluded SVG to diagonal.  Patent LIMA to LAD.  50% distal left main.  95% ostial LAD before the origin of the first septal perforator followed by LAD total occlusion mild diagonals.  Occlusion of the mid circumflex.  Occlusion of the mid RCA.  Normal LVEDP.  Recent documented echo with EF greater than 50%.  PCI: Technically difficult due to stent overhang into the aorta.  Were able to get a wire down but could not advance a Spider device due to tenuous guide catheter support to origin of the obtuse marginal.  Unable to use and Amplatz due to stent overhang.  Left coronary bypass graft catheter also did not provide coaxial alignment with the graft.  Therefore, predilatation with a 3 oh by 12 balloon, postdilatation intracoronary verapamil, 100 mcg.  Positioning and deployment of a 22 x 4.0 mm Onyx 2 full expansion.  One balloon inflation performed.  12 atm total.  Post deployment, intracoronary verapamil, 100 mcg also given.  Post procedure, TIMI grade III flow with less than 10% stenosis.

## 2020-04-22 NOTE — Progress Notes (Signed)
Isabela for heparin gtt  Indication: chest pain/ACS  Allergies  Allergen Reactions  . Anoro Ellipta [Umeclidinium-Vilanterol] Other (See Comments)    Cardiac problems  . Fenofibrate Other (See Comments)    No energy  . Lipitor [Atorvastatin Calcium] Other (See Comments)    Not known   . Niaspan [Niacin] Other (See Comments)    Not known  . Zocor [Simvastatin] Other (See Comments)    Not known    Patient Measurements: Height: 5\' 10"  (177.8 cm) Weight: 93 kg (205 lb) IBW/kg (Calculated) : 73 Heparin Dosing Weight: HEPARIN DW (KG): 91.8   Vital Signs: Temp: 98 F (36.7 C) (02/21 0736) Temp Source: Oral (02/21 0736) BP: 131/73 (02/21 0800) Pulse Rate: 60 (02/21 0800)  Labs: Recent Labs    04/19/20 1432 04/20/20 0043 04/20/20 0619 04/20/20 1009 04/21/20 0058 04/21/20 0939 04/21/20 2233 04/22/20 0030  HGB 11.5*  --   --   --  10.0*  --   --  10.8*  HCT 37.1*  --   --   --  30.7*  --   --  34.8*  PLT 477*  --   --   --  372  --   --  340  HEPARINUNFRC  --    < >  --    < > 0.15* 0.64 0.52 0.45  CREATININE 0.98  --  0.86  --   --   --   --   --    < > = values in this interval not displayed.     Assessment: 80 y.o. male requires anticoagulation with a heparin iv infusion for the indication of  chest pain/ACS. Heparin gtt will be started following pharmacy protocol per pharmacy consult. Patient is not on anticoagulation PTA.   Heparin level is therapeutic at 0.45, on 2000 units/hr. Hgb 10.8, plt 340. No s/sx of bleeding or infusion issues.   Goal of Therapy:  Heparin level 0.3-0.7 units/ml Monitor platelets by anticoagulation protocol: Yes   Plan:  Continue IV heparin at 2000 units/hr  Will f/u daily heparin level and CBC  Antonietta Jewel, PharmD, [place] Pharmacist  Phone: [phone] 04/22/2020 2:28 PM  Please check AMION for all [place] phone numbers After 10:00 PM, call [place] [phone]

## 2020-04-22 NOTE — Progress Notes (Signed)
Progress Note  Patient Name: William Elliott Date of Encounter: 04/22/2020  [facility] Cardiologist: [name], MD   Subjective   No more chest pain or SOB since the admission. States breathing is good.   Inpatient Medications    Scheduled Meds: . amLODipine  5 mg Oral Daily  . arformoterol  15 mcg Nebulization BID  . aspirin EC  81 mg Oral Daily  . ezetimibe  10 mg Oral Daily  . guaiFENesin  1,200 mg Oral BID  . hydrochlorothiazide  25 mg Oral QODAY  . isosorbide mononitrate  30 mg Oral BID  . losartan  50 mg Oral QHS  . metoprolol succinate  25 mg Oral Daily  . multivitamin with minerals  1 tablet Oral Daily  . omega-3 acid ethyl esters  1 capsule Oral Daily  . pantoprazole  40 mg Oral Daily  . potassium chloride SA  20 mEq Oral BID  . vitamin E  1,000 Units Oral Daily  . zinc sulfate  220 mg Oral Daily   Continuous Infusions: . sodium chloride 1 mL/kg/hr (04/22/20 1100)  . heparin 2,000 Units/hr (04/22/20 1055)   PRN Meds: acetaminophen, albuterol, ALPRAZolam, benzonatate, nitroGLYCERIN, ondansetron (ZOFRAN) IV, zolpidem   Vital Signs    Vitals:   04/22/20 0413 04/22/20 0736 04/22/20 0738 04/22/20 0800  BP: 136/77 133/74  131/73  Pulse: 90 60  60  Resp: (!) 21 (!) 23  19  Temp: 98.2 F (36.8 C) 98 F (36.7 C)    TempSrc: Oral Oral    SpO2: 97% 94% 98% 99%  Weight:      Height:        Intake/Output Summary (Last 24 hours) at 04/22/2020 1102 Last data filed at 04/22/2020 0757 Gross per 24 hour  Intake 801.98 ml  Output 650 ml  Net 151.98 ml   Last 3 Weights 04/19/2020 04/19/2020 02/09/2019  Weight (lbs) 205 lb 205 lb 205 lb  Weight (kg) 92.987 kg 92.987 kg 92.987 kg      Telemetry    A-fib with ventricular rates in 80', frequent PACs, PVCs - Personally Reviewed  ECG    Atrial fibrillation with ST depressions in the inferolateral leads and frequent PVCs- Personally Reviewed  Physical Exam   GEN: No acute distress.   Neck: No  JVD Cardiac: iRRR, no murmurs, rubs, or gallops.  Respiratory: Clear to auscultation bilaterally. GI: Soft, nontender, non-distended  MS: No edema; No deformity. Neuro:  Nonfocal  Psych: Normal affect   Labs    High Sensitivity Troponin:   Recent Labs  Lab 04/19/20 1432 04/19/20 1629 04/20/20 0043 04/20/20 0619  TROPONINIHS >27,000* >27,000* >27,000* 18,037*      Chemistry Recent Labs  Lab 04/19/20 1432 04/20/20 0619  NA 135 134*  K 4.1 4.2  CL 105 101  CO2 23 24  GLUCOSE 136* 124*  BUN 16 10  CREATININE 0.98 0.86  CALCIUM 9.6 8.6*  GFRNONAA >60 >60  ANIONGAP 7 9     Hematology Recent Labs  Lab 04/19/20 1432 04/21/20 0058 04/22/20 0030  WBC 11.4* 12.5* 9.6  RBC 4.28 3.68* 4.10*  HGB 11.5* 10.0* 10.8*  HCT 37.1* 30.7* 34.8*  MCV 86.7 83.4 84.9  MCH 26.9 27.2 26.3  MCHC 31.0 32.6 31.0  RDW 20.9* 20.5* 20.6*  PLT 477* 372 340    BNP Recent Labs  Lab 04/20/20 0619  BNP 662.3*     DDimer No results for input(s): DDIMER in the last 168 hours.  Radiology    ECHOCARDIOGRAM COMPLETE  Result Date: 04/20/2020    ECHOCARDIOGRAM REPORT   Patient Name:   William Elliott Date of Exam: 04/20/2020 Medical Rec #:  [id number]       Height:       70.0 in Accession #:    [id number]      Weight:       205.0 lb Date of Birth:  1940/04/18      BSA:          2.109 m Patient Age:    80 years        BP:           153/71 mmHg Patient Gender: M               HR:           77 bpm. Exam Location:  Inpatient Procedure: 2D Echo, Cardiac Doppler, Color Doppler and Intracardiac            Opacification Agent Indications:    NSTEMI  History:        Patient has prior history of Echocardiogram examinations, most                 recent 12/09/2015. CAD and Acute MI, Prior CABG, COPD,                 Arrythmias:Atrial Fibrillation; Risk Factors:Hypertension,                 Dyslipidemia and Former Smoker.  Sonographer:    Clayton Lefort RDCS (AE) Referring Phys: [id number] FAN YE IMPRESSIONS  1.  Left ventricular ejection fraction, by estimation, is 55 to 60%. The left ventricle has normal function. The left ventricle demonstrates regional wall motion abnormalities (see scoring diagram/findings for description). There is severe left ventricular hypertrophy. Left ventricular diastolic function could not be evaluated. There is moderate hypokinesis of the left ventricular, basal-mid inferior wall.  2. Right ventricular systolic function is low normal. The right ventricular size is normal. Tricuspid regurgitation signal is inadequate for assessing PA pressure.  3. Left atrial size was severely dilated.  4. Right atrial size was mildly dilated.  5. Moderate pleural effusion in the left lateral region.  6. The mitral valve is abnormal. Trivial mitral valve regurgitation.  7. The aortic valve is tricuspid. Aortic valve regurgitation is not visualized. Mild aortic valve sclerosis is present, with no evidence of aortic valve stenosis. Aortic valve mean gradient measures 6.0 mmHg.  8. The inferior vena cava is dilated in size with <50% respiratory variability, suggesting right atrial pressure of 15 mmHg. Comparison(s): Changes from prior study are noted. 12/09/15: LVEF 65-70%, normal wall motion. FINDINGS  Left Ventricle: Left ventricular ejection fraction, by estimation, is 55 to 60%. The left ventricle has normal function. The left ventricle demonstrates regional wall motion abnormalities. Moderate hypokinesis of the left ventricular, basal-mid inferior  wall. Definity contrast agent was given IV to delineate the left ventricular endocardial borders. The left ventricular internal cavity size was normal in size. There is severe left ventricular hypertrophy. Left ventricular diastolic function could not be evaluated due to atrial fibrillation. Left ventricular diastolic function could not be evaluated. Right Ventricle: The right ventricular size is normal. No increase in right ventricular wall thickness. Right  ventricular systolic function is low normal. Tricuspid regurgitation signal is inadequate for assessing PA pressure. Left Atrium: Left atrial size was severely dilated. Right Atrium: Right atrial size was mildly dilated. Pericardium: There is no evidence  of pericardial effusion. Mitral Valve: The mitral valve is abnormal. There is mild thickening of the mitral valve leaflet(s). Mild mitral annular calcification. Trivial mitral valve regurgitation. Tricuspid Valve: The tricuspid valve is grossly normal. Tricuspid valve regurgitation is trivial. Aortic Valve: The aortic valve is tricuspid. Aortic valve regurgitation is not visualized. Mild aortic valve sclerosis is present, with no evidence of aortic valve stenosis. Aortic valve mean gradient measures 6.0 mmHg. Aortic valve peak gradient measures 10.9 mmHg. Aortic valve area, by VTI measures 2.21 cm. Pulmonic Valve: The pulmonic valve was grossly normal. Pulmonic valve regurgitation is trivial. Aorta: The aortic root and ascending aorta are structurally normal, with no evidence of dilitation. Venous: The inferior vena cava is dilated in size with less than 50% respiratory variability, suggesting right atrial pressure of 15 mmHg. IAS/Shunts: There is right bowing of the interatrial septum, suggestive of elevated left atrial pressure. No atrial level shunt detected by color flow Doppler. Additional Comments: There is a moderate pleural effusion in the left lateral region.  LEFT VENTRICLE PLAX 2D LVIDd:         4.70 cm LVIDs:         3.10 cm LV PW:         2.00 cm LV IVS:        1.80 cm LVOT diam:     2.00 cm LV SV:         75 LV SV Index:   36 LVOT Area:     3.14 cm  RIGHT VENTRICLE            IVC RV Basal diam:  3.70 cm    IVC diam: 2.80 cm RV Mid diam:    4.40 cm RV S prime:     9.25 cm/s TAPSE (M-mode): 1.2 cm LEFT ATRIUM              Index       RIGHT ATRIUM           Index LA diam:        4.60 cm  2.18 cm/m  RA Area:     24.40 cm LA Vol (A2C):   103.0 ml 48.83  ml/m RA Volume:   72.90 ml  34.56 ml/m LA Vol (A4C):   102.0 ml 48.36 ml/m LA Biplane Vol: 102.0 ml 48.36 ml/m  AORTIC VALVE AV Area (Vmax):    2.25 cm AV Area (Vmean):   2.05 cm AV Area (VTI):     2.21 cm AV Vmax:           165.00 cm/s AV Vmean:          117.667 cm/s AV VTI:            0.341 m AV Peak Grad:      10.9 mmHg AV Mean Grad:      6.0 mmHg LVOT Vmax:         118.33 cm/s LVOT Vmean:        76.733 cm/s LVOT VTI:          0.240 m LVOT/AV VTI ratio: 0.70  AORTA Ao Root diam: 3.50 cm Ao Asc diam:  3.30 cm MR Peak grad: 80.6 mmHg MR Mean grad: 58.0 mmHg   SHUNTS MR Vmax:      449.00 cm/s Systemic VTI:  0.24 m MR Vmean:     367.0 cm/s  Systemic Diam: 2.00 cm Lyman Bishop MD Electronically signed by Lyman Bishop MD Signature Date/Time: 04/20/2020/1:08:51 PM    Final  Cardiac Studies     Patient Profile     80 y.o. male with a PMHx of permanent atrial fibrillation not on [facility], CAD s/p 4V CABG - 1999 with LIMA-LAD, SVG-DIAG, SVG-OM, SVG-RPDA and DES to SVG-OM in 2017, HTN, HLD, COPD, bilateral carotid artery stenosis, history of CVA, history of GI bleeding required blood transfusion who is transferred from [facility] for chest pain evaluation. Patient presents with atypical angina. Troponin x 2 are significantly elevated. EKGs showed atrial fibrillation without acute ST changes. Currently chest pain free, on room air. He is mildly volume overloaded with warm extremities on exam.  Assessment & Plan    1.  NSTEMI -  History of 4V CABG and PCI. Cardiac cath in 2017 showed patent LIMA to the LAD. Occluded SVGs to RPDA and diagonal. Underwent stenting of SVG to OM then. - troponin > 27,000 -> 18,037,  -continue ASA and zetia (hx of intolerance of statin medication) -continue heparin gtt- Plan for cardiac cath today.  -continue losartan 50mg   -continue imdur 30mg  BID -Echo showed LVEF 55-60%   2. Permanent atrial fibrillation - rate is well controlled.  - no AC, Patient had remote GI  bleed in 2007 with Mallory Weiss tear. Developed AFib in 2017 and was placed on Xarelto. This was discontinued in 2018 because of bleeding hemorrhoids. Never had repeat GI evaluation. [place] vasc score is 6. May want to revisit anticoagulation at some point depending on ischemic work up - previous bradycardia, but now HR in 31' and frequent PVCs, I will start low dose toprol XL in the settings of NSTEMI - continue to monitor  3. HTN - well controlled on current regimen -continue Norvasc -continue losartan  For questions or updates, please contact [place] Please consult [email or web address] for contact info under     Signed, Travers Goodley [place], MD  04/22/2020, 11:02 AM

## 2020-04-22 NOTE — H&P (View-Only) (Signed)
Progress Note  Patient Name: William Elliott Date of Encounter: 04/22/2020  [facility] Cardiologist: [name], MD   Subjective   No more chest pain or SOB since the admission. States breathing is good.   Inpatient Medications    Scheduled Meds: . amLODipine  5 mg Oral Daily  . arformoterol  15 mcg Nebulization BID  . aspirin EC  81 mg Oral Daily  . ezetimibe  10 mg Oral Daily  . guaiFENesin  1,200 mg Oral BID  . hydrochlorothiazide  25 mg Oral QODAY  . isosorbide mononitrate  30 mg Oral BID  . losartan  50 mg Oral QHS  . metoprolol succinate  25 mg Oral Daily  . multivitamin with minerals  1 tablet Oral Daily  . omega-3 acid ethyl esters  1 capsule Oral Daily  . pantoprazole  40 mg Oral Daily  . potassium chloride SA  20 mEq Oral BID  . vitamin E  1,000 Units Oral Daily  . zinc sulfate  220 mg Oral Daily   Continuous Infusions: . sodium chloride 1 mL/kg/hr (04/22/20 1100)  . heparin 2,000 Units/hr (04/22/20 1055)   PRN Meds: acetaminophen, albuterol, ALPRAZolam, benzonatate, nitroGLYCERIN, ondansetron (ZOFRAN) IV, zolpidem   Vital Signs    Vitals:   04/22/20 0413 04/22/20 0736 04/22/20 0738 04/22/20 0800  BP: 136/77 133/74  131/73  Pulse: 90 60  60  Resp: (!) 21 (!) 23  19  Temp: 98.2 F (36.8 C) 98 F (36.7 C)    TempSrc: Oral Oral    SpO2: 97% 94% 98% 99%  Weight:      Height:        Intake/Output Summary (Last 24 hours) at 04/22/2020 1102 Last data filed at 04/22/2020 0757 Gross per 24 hour  Intake 801.98 ml  Output 650 ml  Net 151.98 ml   Last 3 Weights 04/19/2020 04/19/2020 02/09/2019  Weight (lbs) 205 lb 205 lb 205 lb  Weight (kg) 92.987 kg 92.987 kg 92.987 kg      Telemetry    A-fib with ventricular rates in 80', frequent PACs, PVCs - Personally Reviewed  ECG    Atrial fibrillation with ST depressions in the inferolateral leads and frequent PVCs- Personally Reviewed  Physical Exam   GEN: No acute distress.   Neck: No  JVD Cardiac: iRRR, no murmurs, rubs, or gallops.  Respiratory: Clear to auscultation bilaterally. GI: Soft, nontender, non-distended  MS: No edema; No deformity. Neuro:  Nonfocal  Psych: Normal affect   Labs    High Sensitivity Troponin:   Recent Labs  Lab 04/19/20 1432 04/19/20 1629 04/20/20 0043 04/20/20 0619  TROPONINIHS >27,000* >27,000* >27,000* 18,037*      Chemistry Recent Labs  Lab 04/19/20 1432 04/20/20 0619  NA 135 134*  K 4.1 4.2  CL 105 101  CO2 23 24  GLUCOSE 136* 124*  BUN 16 10  CREATININE 0.98 0.86  CALCIUM 9.6 8.6*  GFRNONAA >60 >60  ANIONGAP 7 9     Hematology Recent Labs  Lab 04/19/20 1432 04/21/20 0058 04/22/20 0030  WBC 11.4* 12.5* 9.6  RBC 4.28 3.68* 4.10*  HGB 11.5* 10.0* 10.8*  HCT 37.1* 30.7* 34.8*  MCV 86.7 83.4 84.9  MCH 26.9 27.2 26.3  MCHC 31.0 32.6 31.0  RDW 20.9* 20.5* 20.6*  PLT 477* 372 340    BNP Recent Labs  Lab 04/20/20 0619  BNP 662.3*     DDimer No results for input(s): DDIMER in the last 168 hours.  Radiology    ECHOCARDIOGRAM COMPLETE  Result Date: 04/20/2020    ECHOCARDIOGRAM REPORT   Patient Name:   [facility] Date of Exam: 04/20/2020 Medical Rec #:  [id number]       Height:       70.0 in Accession #:    [id number]      Weight:       205.0 lb Date of Birth:  01-14-1941      BSA:          2.109 m Patient Age:    21 years        BP:           153/71 mmHg Patient Gender: M               HR:           77 bpm. Exam Location:  Inpatient Procedure: 2D Echo, Cardiac Doppler, Color Doppler and Intracardiac            Opacification Agent Indications:    NSTEMI  History:        Patient has prior history of Echocardiogram examinations, most                 recent 12/09/2015. CAD and Acute MI, Prior CABG, COPD,                 Arrythmias:Atrial Fibrillation; Risk Factors:Hypertension,                 Dyslipidemia and Former Smoker.  Sonographer:    Clayton Lefort RDCS (AE) Referring Phys: [id number] FAN YE IMPRESSIONS  1.  Left ventricular ejection fraction, by estimation, is 55 to 60%. The left ventricle has normal function. The left ventricle demonstrates regional wall motion abnormalities (see scoring diagram/findings for description). There is severe left ventricular hypertrophy. Left ventricular diastolic function could not be evaluated. There is moderate hypokinesis of the left ventricular, basal-mid inferior wall.  2. Right ventricular systolic function is low normal. The right ventricular size is normal. Tricuspid regurgitation signal is inadequate for assessing PA pressure.  3. Left atrial size was severely dilated.  4. Right atrial size was mildly dilated.  5. Moderate pleural effusion in the left lateral region.  6. The mitral valve is abnormal. Trivial mitral valve regurgitation.  7. The aortic valve is tricuspid. Aortic valve regurgitation is not visualized. Mild aortic valve sclerosis is present, with no evidence of aortic valve stenosis. Aortic valve mean gradient measures 6.0 mmHg.  8. The inferior vena cava is dilated in size with <50% respiratory variability, suggesting right atrial pressure of 15 mmHg. Comparison(s): Changes from prior study are noted. 12/09/15: LVEF 65-70%, normal wall motion. FINDINGS  Left Ventricle: Left ventricular ejection fraction, by estimation, is 55 to 60%. The left ventricle has normal function. The left ventricle demonstrates regional wall motion abnormalities. Moderate hypokinesis of the left ventricular, basal-mid inferior  wall. Definity contrast agent was given IV to delineate the left ventricular endocardial borders. The left ventricular internal cavity size was normal in size. There is severe left ventricular hypertrophy. Left ventricular diastolic function could not be evaluated due to atrial fibrillation. Left ventricular diastolic function could not be evaluated. Right Ventricle: The right ventricular size is normal. No increase in right ventricular wall thickness. Right  ventricular systolic function is low normal. Tricuspid regurgitation signal is inadequate for assessing PA pressure. Left Atrium: Left atrial size was severely dilated. Right Atrium: Right atrial size was mildly dilated. Pericardium: There is no evidence  of pericardial effusion. Mitral Valve: The mitral valve is abnormal. There is mild thickening of the mitral valve leaflet(s). Mild mitral annular calcification. Trivial mitral valve regurgitation. Tricuspid Valve: The tricuspid valve is grossly normal. Tricuspid valve regurgitation is trivial. Aortic Valve: The aortic valve is tricuspid. Aortic valve regurgitation is not visualized. Mild aortic valve sclerosis is present, with no evidence of aortic valve stenosis. Aortic valve mean gradient measures 6.0 mmHg. Aortic valve peak gradient measures 10.9 mmHg. Aortic valve area, by VTI measures 2.21 cm. Pulmonic Valve: The pulmonic valve was grossly normal. Pulmonic valve regurgitation is trivial. Aorta: The aortic root and ascending aorta are structurally normal, with no evidence of dilitation. Venous: The inferior vena cava is dilated in size with less than 50% respiratory variability, suggesting right atrial pressure of 15 mmHg. IAS/Shunts: There is right bowing of the interatrial septum, suggestive of elevated left atrial pressure. No atrial level shunt detected by color flow Doppler. Additional Comments: There is a moderate pleural effusion in the left lateral region.  LEFT VENTRICLE PLAX 2D LVIDd:         4.70 cm LVIDs:         3.10 cm LV PW:         2.00 cm LV IVS:        1.80 cm LVOT diam:     2.00 cm LV SV:         75 LV SV Index:   36 LVOT Area:     3.14 cm  RIGHT VENTRICLE            IVC RV Basal diam:  3.70 cm    IVC diam: 2.80 cm RV Mid diam:    4.40 cm RV S prime:     9.25 cm/s TAPSE (M-mode): 1.2 cm LEFT ATRIUM              Index       RIGHT ATRIUM           Index LA diam:        4.60 cm  2.18 cm/m  RA Area:     24.40 cm LA Vol (A2C):   103.0 ml 48.83  ml/m RA Volume:   72.90 ml  34.56 ml/m LA Vol (A4C):   102.0 ml 48.36 ml/m LA Biplane Vol: 102.0 ml 48.36 ml/m  AORTIC VALVE AV Area (Vmax):    2.25 cm AV Area (Vmean):   2.05 cm AV Area (VTI):     2.21 cm AV Vmax:           165.00 cm/s AV Vmean:          117.667 cm/s AV VTI:            0.341 m AV Peak Grad:      10.9 mmHg AV Mean Grad:      6.0 mmHg LVOT Vmax:         118.33 cm/s LVOT Vmean:        76.733 cm/s LVOT VTI:          0.240 m LVOT/AV VTI ratio: 0.70  AORTA Ao Root diam: 3.50 cm Ao Asc diam:  3.30 cm MR Peak grad: 80.6 mmHg MR Mean grad: 58.0 mmHg   SHUNTS MR Vmax:      449.00 cm/s Systemic VTI:  0.24 m MR Vmean:     367.0 cm/s  Systemic Diam: 2.00 cm Lyman Bishop MD Electronically signed by Lyman Bishop MD Signature Date/Time: 04/20/2020/1:08:51 PM    Final  Cardiac Studies     Patient Profile     80 y.o. male with a PMHx of permanent atrial fibrillation not on [facility], CAD s/p 4V CABG - 1999 with LIMA-LAD, SVG-DIAG, SVG-OM, SVG-RPDA and DES to SVG-OM in 2017, HTN, HLD, COPD, bilateral carotid artery stenosis, history of CVA, history of GI bleeding required blood transfusion who is transferred from [facility] for chest pain evaluation. Patient presents with atypical angina. Troponin x 2 are significantly elevated. EKGs showed atrial fibrillation without acute ST changes. Currently chest pain free, on room air. He is mildly volume overloaded with warm extremities on exam.  Assessment & Plan    1.  NSTEMI -  History of 4V CABG and PCI. Cardiac cath in 2017 showed patent LIMA to the LAD. Occluded SVGs to RPDA and diagonal. Underwent stenting of SVG to OM then. - troponin > 27,000 -> 18,037,  -continue ASA and zetia (hx of intolerance of statin medication) -continue heparin gtt- Plan for cardiac cath today.  -continue losartan 50mg   -continue imdur 30mg  BID -Echo showed LVEF 55-60%   2. Permanent atrial fibrillation - rate is well controlled.  - no AC, Patient had remote GI  bleed in 2007 with Mallory Weiss tear. Developed AFib in 2017 and was placed on Xarelto. This was discontinued in 2018 because of bleeding hemorrhoids. Never had repeat GI evaluation. [place] vasc score is 6. May want to revisit anticoagulation at some point depending on ischemic work up - previous bradycardia, but now HR in 25' and frequent PVCs, I will start low dose toprol XL in the settings of NSTEMI - continue to monitor  3. HTN - well controlled on current regimen -continue Norvasc -continue losartan  For questions or updates, please contact [place] Please consult [email or web address] for contact info under     Signed, Arren Laminack [place], MD  04/22/2020, 11:02 AM

## 2020-04-22 NOTE — Plan of Care (Signed)

## 2020-04-22 NOTE — Progress Notes (Signed)
Removed Fem Sheath.  VVS every 3 min stable Pressure held for 20 min.   Level 0 Pt educated Nurse educated.   Kathleene Hazel RN

## 2020-04-22 NOTE — Care Management Important Message (Signed)
Important Message  Patient Details  Name: William Elliott MRN: [id number] Date of Birth: [birth year]   Medicare Important Message Given:  Yes     Orbie Pyo 04/22/2020, 3:55 PM

## 2020-04-22 NOTE — Interval H&P Note (Signed)
Cath Lab Visit (complete for each Cath Lab visit)  Clinical Evaluation Leading to the Procedure:   ACS: Yes.    Non-ACS:    Anginal Classification: CCS III  Anti-ischemic medical therapy: Minimal Therapy (1 class of medications)  Non-Invasive Test Results: No non-invasive testing performed  Prior CABG: No previous CABG      History and Physical Interval Note:  04/22/2020 2:45 PM  William Elliott  has presented today for surgery, with the diagnosis of chest pain.  The various methods of treatment have been discussed with the patient and family. After consideration of risks, benefits and other options for treatment, the patient has consented to  Procedure(s): LEFT HEART CATH AND CORS/GRAFTS ANGIOGRAPHY (N/A) as a surgical intervention.  The patient's history has been reviewed, patient examined, no change in status, stable for surgery.  I have reviewed the patient's chart and labs.  Questions were answered to the patient's satisfaction.     Belva Crome III

## 2020-04-23 ENCOUNTER — Encounter (HOSPITAL_COMMUNITY): Payer: Self-pay | Admitting: Interventional Cardiology

## 2020-04-23 ENCOUNTER — Other Ambulatory Visit: Payer: Self-pay | Admitting: Medical

## 2020-04-23 ENCOUNTER — Telehealth: Payer: Self-pay | Admitting: Cardiology

## 2020-04-23 DIAGNOSIS — J449 Chronic obstructive pulmonary disease, unspecified: Secondary | ICD-10-CM

## 2020-04-23 MED ORDER — CLOPIDOGREL BISULFATE 75 MG PO TABS
75.0000 mg | ORAL_TABLET | Freq: Every day | ORAL | 3 refills | Status: DC
Start: 2020-04-24 — End: 2021-03-31

## 2020-04-23 MED ORDER — METOPROLOL SUCCINATE ER 25 MG PO TB24: 25.0000 mg | ORAL_TABLET | Freq: Every day | ORAL | 3 refills | Status: DC

## 2020-04-23 MED ORDER — ISOSORBIDE MONONITRATE ER 30 MG PO TB24
30.0000 mg | ORAL_TABLET | Freq: Two times a day (BID) | ORAL | 3 refills | Status: DC
Start: 2020-04-23 — End: 2020-11-18

## 2020-04-23 MED FILL — METOPROLOL SUCCINATE ER 25: 25 | 90 days supply | Qty: 90 | Fill #0

## 2020-04-23 MED FILL — CLOPIDOGREL 75 MG TABLET: 75 | 90 days supply | Qty: 90 | Fill #0

## 2020-04-23 NOTE — Progress Notes (Signed)
This RN notified by phlebotomist that patient refused morning lab draws.

## 2020-04-23 NOTE — Progress Notes (Signed)
Progress Note  Patient Name: William Elliott Date of Encounter: 04/23/2020  [facility] Cardiologist: [name], MD   Subjective   Feels well. No chest pain or dyspnea.  Inpatient Medications    Scheduled Meds: . amLODipine  5 mg Oral Daily  . arformoterol  15 mcg Nebulization BID  . aspirin  81 mg Oral Daily  . clopidogrel  75 mg Oral Q breakfast  . ezetimibe  10 mg Oral Daily  . guaiFENesin  1,200 mg Oral BID  . heparin  5,000 Units Subcutaneous Q8H  . hydrochlorothiazide  25 mg Oral QODAY  . isosorbide mononitrate  30 mg Oral BID  . losartan  50 mg Oral QHS  . metoprolol succinate  25 mg Oral Daily  . multivitamin with minerals  1 tablet Oral Daily  . omega-3 acid ethyl esters  1 capsule Oral Daily  . pantoprazole  40 mg Oral Daily  . potassium chloride SA  20 mEq Oral BID  . sodium chloride flush  3 mL Intravenous Q12H  . vitamin E  1,000 Units Oral Daily  . zinc sulfate  220 mg Oral Daily   Continuous Infusions: . sodium chloride    . nitroGLYCERIN Stopped (04/23/20 0739)   PRN Meds: sodium chloride, acetaminophen, albuterol, ALPRAZolam, benzonatate, nitroGLYCERIN, ondansetron (ZOFRAN) IV, oxyCODONE, sodium chloride flush, zolpidem   Vital Signs    Vitals:   04/22/20 2300 04/23/20 0300 04/23/20 0748 04/23/20 0803  BP: 131/66 108/67  137/81  Pulse: 82 84  79  Resp: _0 Temp: 98.3 F (36.8 C) 98.5 F (36.9 C)  98.5 F (36.9 C)  TempSrc: Oral Oral  Oral  SpO2: 96% 92% 93% 95%  Weight:      Height:        Intake/Output Summary (Last 24 hours) at 04/23/2020 1032 Last data filed at 04/23/2020 0803 Gross per 24 hour  Intake 1219.72 ml  Output 1525 ml  Net -305.28 ml   Last 3 Weights 04/19/2020 04/19/2020 02/09/2019  Weight (lbs) 205 lb 205 lb 205 lb  Weight (kg) 92.987 kg 92.987 kg 92.987 kg      Telemetry    A-fib with ventricular rates controlled, frequent PACs, PVCs - Personally Reviewed  ECG    Atrial fibrillation with  nonspecific ST changes- Personally Reviewed  Physical Exam   GEN: No acute distress.   Neck: No JVD Cardiac: iRRR, no murmurs, rubs, or gallops.  Respiratory: Clear to auscultation bilaterally. GI: Soft, nontender, non-distended  MS: No edema; No deformity. Right groin without hematoma Neuro:  Nonfocal  Psych: Normal affect   Labs    High Sensitivity Troponin:   Recent Labs  Lab 04/19/20 1432 04/19/20 1629 04/20/20 0043 04/20/20 0619  TROPONINIHS >27,000* >27,000* >27,000* 18,037*      Chemistry Recent Labs  Lab 04/19/20 1432 04/20/20 0619  NA 135 134*  K 4.1 4.2  CL 105 101  CO2 23 24  GLUCOSE 136* 124*  BUN 16 10  CREATININE 0.98 0.86  CALCIUM 9.6 8.6*  GFRNONAA >60 >60  ANIONGAP 7 9     Hematology Recent Labs  Lab 04/19/20 1432 04/21/20 0058 04/22/20 0030  WBC 11.4* 12.5* 9.6  RBC 4.28 3.68* 4.10*  HGB 11.5* 10.0* 10.8*  HCT 37.1* 30.7* 34.8*  MCV 86.7 83.4 84.9  MCH 26.9 27.2 26.3  MCHC 31.0 32.6 31.0  RDW 20.9* 20.5* 20.6*  PLT 477* 372 340    BNP Recent Labs  Lab 04/20/20 0619  BNP 662.3*     DDimer No results for input(s): DDIMER in the last 168 hours.   Radiology    CARDIAC CATHETERIZATION  Result Date: 04/22/2020  A stent was successfully placed.   99% mid body stenosis in SVG to obtuse marginal reduced to 10% with TIMI grade III flow using a 4.0 x 22 Onyx deployed at 12 atm.  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 (The previously placed ostial to proximal saphenous vein to OM stent overhangs into the ascending aorta preventing engagement with extra-support guide catheters.  An internal mammary guide catheter was used to coaxially aim and advanced the guidewire into the graft).  Chronic occlusion of SVG to PDA.  Chronic occlusion of SVG to diagonal.  Patent LIMA to the mid to distal LAD.  Supplies collaterals to the PDA.  Total occlusion of the mid LAD.  95% ostial LAD stenosis with only  septal perforator and a small diagonal at risk.  Total occlusion of the mid circumflex  Total occlusion of the mid RCA.  PDA is supplied by LAD collaterals when the LIMA graft to the LAD is visualized.  Echocardiogram documents preserved LV systolic function, EF greater than 50%.  LVEDP is normal, 7 mmHg. RECOMMENDATIONS:  Aspirin and Plavix for 12 months.  If decision to use anticoagulation for management of atrial fibrillation is made, would recommend discontinuing aspirin after 1 month and continuing Plavix to complete 12 months    Cardiac Studies   CORONARY STENT INTERVENTION  LEFT HEART CATH AND CORS/GRAFTS ANGIOGRAPHY    Conclusion    A stent was successfully placed.    99% mid body stenosis in SVG to obtuse marginal reduced to 10% with TIMI grade III flow using a 4.0 x 22 Onyx deployed at 12 atm.  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 (The previously placed ostial to proximal saphenous vein to OM stent overhangs into the ascending aorta preventing engagement with extra-support guide catheters.  An internal mammary guide catheter was used to coaxially aim and advanced the guidewire into the graft).  Chronic occlusion of SVG to PDA.  Chronic occlusion of SVG to diagonal.  Patent LIMA to the mid to distal LAD.  Supplies collaterals to the PDA.  Total occlusion of the mid LAD.  95% ostial LAD stenosis with only septal perforator and a small diagonal at risk.  Total occlusion of the mid circumflex  Total occlusion of the mid RCA.  PDA is supplied by LAD collaterals when the LIMA graft to the LAD is visualized.  Echocardiogram documents preserved LV systolic function, EF greater than 50%.  LVEDP is normal, 7 mmHg.  RECOMMENDATIONS:   Aspirin and Plavix for 12 months.  If decision to use anticoagulation for management of atrial fibrillation is made, would recommend discontinuing aspirin after 1 month and continuing Plavix to complete  12 months    Patient Profile     80 y.o. male with a PMHx of permanent atrial fibrillation not on [facility], CAD s/p 4V CABG - 1999 with LIMA-LAD, SVG-DIAG, SVG-OM, SVG-RPDA and DES to SVG-OM in 2017, HTN, HLD, COPD, bilateral carotid artery stenosis, history of CVA, history of GI bleeding required blood transfusion who is transferred from [facility] for chest pain evaluation. Patient presents with atypical angina. Troponin x 2 are significantly elevated. EKGs showed atrial fibrillation without acute ST changes. Currently chest pain free, on room air. He is mildly volume overloaded  with warm extremities on exam.  Assessment & Plan    1.  NSTEMI -  History of 4V CABG and PCI. Cardiac cath in 2017 showed patent LIMA to the LAD. Occluded SVGs to RPDA and diagonal. Underwent stenting of SVG to OM then. - Now troponin > 27,000 -> 18,037,  -continue ASA and zetia (hx of intolerance of statin medication) -cardiac cath showed a new lesion in the SVG to OM treated with another DES. Needs to be on ASA and Plavix for a minimum of one year.  -continue losartan 51m  -continue imdur 37mBID -Echo showed LVEF 55-60%   2. Permanent atrial fibrillation - rate is well controlled.  - no AC, Patient had remote GI bleed in 2007 with Mallory Weiss tear. Developed AFib in 2017 and was placed on Xarelto. This was discontinued in 2018 because of bleeding hemorrhoids. Never had repeat GI evaluation. CHMaliasc score is 6. May want to revisit anticoagulation at some point but patient very concerned about bleeding risk. Will defer to Dr McDomenic Polite - previous bradycardia, but now HR in 8052and frequent PVCs,  - continue to monitor  3. HTN - well controlled on current regimen -continue Norvasc -continue losartan  Patient is stable for DC home today.  For questions or updates, please contact [facility] consult [email or web address] for contact info under     Signed, Peter JoMartiniqueMD  04/23/2020, 10:32 AM

## 2020-04-23 NOTE — Discharge Summary (Signed)
Discharge Summary    Patient ID: William Elliott MRN: [id number]; DOB: 06-Aug-1940  Admit date: 04/19/2020 Discharge date: 04/23/2020  PCP:  [name], [place]  Cardiologist:  [name], MD  Advanced Practice Provider:  No care team member to display Electrophysiologist:  None   Discharge Diagnoses    Principal Problem:   Non-STEMI (non-ST elevated myocardial infarction) [facility]) Active Problems:   Hyperlipidemia   COPD (chronic obstructive pulmonary disease) ([place])   Essential hypertension   Permanent atrial fibrillation [facility])    Diagnostic Studies/Procedures    Left heart catheterization 04/22/20: CORONARY STENT INTERVENTION  LEFT HEART CATH AND CORS/GRAFTS ANGIOGRAPHY    Conclusion    A stent was successfully placed.   99% mid body stenosis in SVG to obtuse marginal reduced to 10% with TIMI grade III flow using a 4.0 x 22 Onyx deployed at 12 atm. This is felt to be the culprit lesion for the patient's non-ST elevation MI.(The previously placed ostial to proximal saphenous vein to OM stent overhangs into the ascending aorta preventing engagement with extra-support guide catheters. An internal mammary guide catheter was used to coaxially aim and advanced the guidewire into the graft).  Chronic occlusion of SVG to PDA.  Chronic occlusion of SVG to diagonal.  Patent LIMA to the mid to distal LAD. Supplies collaterals to the PDA.  Total occlusion of the mid LAD. 95% ostial LAD stenosis with only septal perforator and a small diagonal at risk.  Total occlusion of the mid circumflex  Total occlusion of the mid RCA. PDA is supplied by LAD collaterals when the LIMA graft to the LAD is visualized.  Echocardiogram documents preserved LV systolic function, EF greater than 50%. LVEDP is normal, 7 mmHg.  RECOMMENDATIONS:   Aspirin and Plavix for 12 months.  If decision to use anticoagulation for management of atrial  fibrillation is made, would recommend discontinuing aspirin after 1 month and continuing Plavix to complete 12 months  Echocardiogram 04/20/20: 1. Left ventricular ejection fraction, by estimation, is 55 to 60%. The  left ventricle has normal function. The left ventricle demonstrates  regional wall motion abnormalities (see scoring diagram/findings for  description). There is severe left  ventricular hypertrophy. Left ventricular diastolic function could not be  evaluated. There is moderate hypokinesis of the left ventricular,  basal-mid inferior wall.  2. Right ventricular systolic function is low normal. The right  ventricular size is normal. Tricuspid regurgitation signal is inadequate  for assessing PA pressure.  3. Left atrial size was severely dilated.  4. Right atrial size was mildly dilated.  5. Moderate pleural effusion in the left lateral region.  6. The mitral valve is abnormal. Trivial mitral valve regurgitation.  7. The aortic valve is tricuspid. Aortic valve regurgitation is not  visualized. Mild aortic valve sclerosis is present, with no evidence of  aortic valve stenosis. Aortic valve mean gradient measures 6.0 mmHg.  8. The inferior vena cava is dilated in size with <50% respiratory  variability, suggesting right atrial pressure of 15 mmHg.  _____________   History of Present Illness     William Elliott is a 80 y.o. male with a PMHx of permanent atrial fibrillation not on [facility], CAD s/p 4VCABG - 1999 with LIMA-LAD, SVG-DIAG, SVG-OM, SVG-RPDAand DES to SVG-OM in 2017, HTN, HLD, COPD, bilateral carotid artery stenosis, history of CVA, history of GI bleeding required blood transfusion who is transferred from [facility] for chest pain evaluation. Patient reports  he has intermittent episodes of mid-chest "hurting" feeling in the past week that did not trigger by activity specifically, which resolves on its own. He had another episode of "really bad" pain this morning after  he woke up. He describes it as mid-chest "huring" associated with mild dyspnea. The pain radiates to his back, left arm and teeth. He said the pain is worse when he lays flat. The pain lasted for approximately for 17mn and  finally resolved after he took 4 SL nitro. He went to local ER for check up. Currently, he is chest pain free, on room air, resting comfortably on bed. He cannot tell this chest pain is similar to the one he had previously prior to his CABG or PCI. Denies fever, chills, dizziness, syncope,lightheadedness, cough,heart palpitations, nausea, vomiting, bendopnea, orthopnea, PND, early satiety, upper abdominal pain, heartburn unrelated to meals, abdominal fullness, dysuria, diarrhea, or any bleeding events.  He says he had positive COVID-19 4 weeks ago and received some breathing treatment. Reports lost 4-5lbs in a month. He says he has chronic lower extremity swelling which did not change recently. He says he usually sleep in bed with 1 pillow.  Relevant workup at OSH K 4.1, Cr 0.98, troponin >27000-->>27000, Hgb 11.5,    Hospital Course     Consultants: None   1. NSTEMI with CAD s/p CABG in 1999 with subsequent PCI/DES to SVG-OM in 2017: [facility] HsTrop was elevated to 27,000. Echo showed EF 55-60% with basal-mid inferior wall motion abnormalities, severe LAE, and mild RAE. He underwent a LHC 04/22/20 which showed 99% mid body stenosis in SVG to OM managed with PCI/DES, otherwise CTO SVG-PDA and SVG-D1, patent LIMA with collaterals to PDA, and severe native disease. He was recommended for aspirin and plavix a minimum of 12 months with consideration to stop aspirin after 1 month if patient placed back on anticoagulation for his atrial fibrillation. His home imdur was increased to 348mBID and he was started on metoprolol succinate 2521maily.  - Continue aspirin and plavix  2. Permanent atrial fibrillation: rates remained stable this  admission. He has a remote history of GI bleed in 2007 2/2 mallory weiss tear, then diagnosed with atrial fibrillation in 2017 and started on xarelto. He had a subsequent GI bleed in 2018 2/2 hemorrhoids, at which time xarelto was stopped, though never restarted. Patient was very hesitant to restart anticoagulation due to bleeding risk despite CHADS2Vasc score of 6.  - Continue ongoing discussions regarding anticoagulation for stroke ppx. - Continue metoprolol for rate control   3. HTN: BP stable this admission. He was started on metoprolol succinate 25m68mily in addition to home losartan.  - Continue amlodipine, metoprolol succinate, losartan, and imdur  4. HLD: no recent lipids on file. He has been on zetia due to prior intolerance to statins - Would check FLP at outpatient follow-up and refer to lipid clinic given progressive CAD and obstructive graft disease.  - Continue zetia and omega 3    Did the patient have an acute coronary syndrome (MI, NSTEMI, STEMI, etc) this admission?:  Yes                               AHA/ACC Clinical Performance & Quality Measures: 1. Aspirin prescribed? - Yes 2. ADP Receptor Inhibitor (Plavix/Clopidogrel, Brilinta/Ticagrelor or Effient/Prasugrel) prescribed (includes medically managed patients)? - Yes 3. Beta Blocker prescribed? - Yes 4. High  Intensity Statin (Lipitor 40-80mg  or Crestor 20-40mg ) prescribed? - No - prior intolerance 5. EF assessed during THIS hospitalization? - Yes 6. For EF <40%, was ACEI/ARB prescribed? - Yes 7. For EF <40%, Aldosterone Antagonist (Spironolactone or Eplerenone) prescribed? - Not Applicable (EF >/= 11%) 8. Cardiac Rehab Phase II ordered (including medically managed patients)? - Yes       _____________  Discharge Vitals Blood pressure 118/60, pulse (!) 57, temperature 98.3 F (36.8 C), temperature source Oral, resp. rate 20, height 5\' 10"  (1.778 m), weight 93 kg, SpO2 94 %.  Filed Weights   04/19/20 1416 04/19/20  1614  Weight: 93 kg 93 kg    Labs & Radiologic Studies    CBC Recent Labs    04/21/20 0058 04/22/20 0030  WBC 12.5* 9.6  HGB 10.0* 10.8*  HCT 30.7* 34.8*  MCV 83.4 84.9  PLT 372 552   Basic Metabolic Panel No results for input(s): NA, K, CL, CO2, GLUCOSE, BUN, CREATININE, CALCIUM, MG, PHOS in the last 72 hours. Liver Function Tests No results for input(s): AST, ALT, ALKPHOS, BILITOT, PROT, ALBUMIN in the last 72 hours. No results for input(s): LIPASE, AMYLASE in the last 72 hours. High Sensitivity Troponin:   Recent Labs  Lab 04/19/20 1432 04/19/20 1629 04/20/20 0043 04/20/20 0619  TROPONINIHS >27,000* >27,000* >27,000* 18,037*    BNP Invalid input(s): POCBNP D-Dimer No results for input(s): DDIMER in the last 72 hours. Hemoglobin A1C No results for input(s): HGBA1C in the last 72 hours. Fasting Lipid Panel No results for input(s): CHOL, HDL, LDLCALC, TRIG, CHOLHDL, LDLDIRECT in the last 72 hours. Thyroid Function Tests No results for input(s): TSH, T4TOTAL, T3FREE, THYROIDAB in the last 72 hours.  Invalid input(s): FREET3 _____________  DG Chest 2 View  Result Date: 04/19/2020 CLINICAL DATA:  Chest pain EXAM: CHEST - 2 VIEW COMPARISON:  December 07, 2015 FINDINGS: The cardiomediastinal silhouette is enlarged in contour.Status post median sternotomy and CABG. Small bilateral pleural effusions. Atherosclerotic calcifications of the aorta. No pneumothorax. Perihilar vascular prominence, peribronchial cuffing, diffuse interstitial prominence and bibasilar predominant heterogeneous opacities. Status post cholecystectomy. Multilevel degenerative changes of the thoracic spine. IMPRESSION: Constellation of findings are favored to reflect pulmonary edema and small bilateral pleural effusions. Differential considerations include atypical infection. Electronically Signed   By: Valentino Saxon MD   On: 04/19/2020 15:06   CARDIAC CATHETERIZATION  Result Date: 04/22/2020  A  stent was successfully placed.   99% mid body stenosis in SVG to obtuse marginal reduced to 10% with TIMI grade III flow using a 4.0 x 22 Onyx deployed at 12 atm.  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 (The previously placed ostial to proximal saphenous vein to OM stent overhangs into the ascending aorta preventing engagement with extra-support guide catheters.  An internal mammary guide catheter was used to coaxially aim and advanced the guidewire into the graft).  Chronic occlusion of SVG to PDA.  Chronic occlusion of SVG to diagonal.  Patent LIMA to the mid to distal LAD.  Supplies collaterals to the PDA.  Total occlusion of the mid LAD.  95% ostial LAD stenosis with only septal perforator and a small diagonal at risk.  Total occlusion of the mid circumflex  Total occlusion of the mid RCA.  PDA is supplied by LAD collaterals when the LIMA graft to the LAD is visualized.  Echocardiogram documents preserved LV systolic function, EF greater than 50%.  LVEDP is normal, 7 mmHg. RECOMMENDATIONS:  Aspirin and Plavix for 12 months.  If decision to use anticoagulation for management of atrial fibrillation is made, would recommend discontinuing aspirin after 1 month and continuing Plavix to complete 12 months   ECHOCARDIOGRAM COMPLETE  Result Date: 04/20/2020    ECHOCARDIOGRAM REPORT   Patient Name:   ANTWAINE BOOMHOWER Date of Exam: 04/20/2020 Medical Rec #:  [id number]       Height:       70.0 in Accession #:    [id number]      Weight:       205.0 lb Date of Birth:  [birth year]      BSA:          2.109 m Patient Age:    44 years        BP:           153/71 mmHg Patient Gender: M               HR:           77 bpm. Exam Location:  Inpatient Procedure: 2D Echo, Cardiac Doppler, Color Doppler and Intracardiac            Opacification Agent Indications:    NSTEMI  History:        Patient has prior history of Echocardiogram examinations, most                 recent 12/09/2015.  CAD and Acute MI, Prior CABG, COPD,                 Arrythmias:Atrial Fibrillation; Risk Factors:Hypertension,                 Dyslipidemia and Former Smoker.  Sonographer:    Clayton Lefort RDCS (AE) Referring Phys: [id number] FAN YE IMPRESSIONS  1. Left ventricular ejection fraction, by estimation, is 55 to 60%. The left ventricle has normal function. The left ventricle demonstrates regional wall motion abnormalities (see scoring diagram/findings for description). There is severe left ventricular hypertrophy. Left ventricular diastolic function could not be evaluated. There is moderate hypokinesis of the left ventricular, basal-mid inferior wall.  2. Right ventricular systolic function is low normal. The right ventricular size is normal. Tricuspid regurgitation signal is inadequate for assessing PA pressure.  3. Left atrial size was severely dilated.  4. Right atrial size was mildly dilated.  5. Moderate pleural effusion in the left lateral region.  6. The mitral valve is abnormal. Trivial mitral valve regurgitation.  7. The aortic valve is tricuspid. Aortic valve regurgitation is not visualized. Mild aortic valve sclerosis is present, with no evidence of aortic valve stenosis. Aortic valve mean gradient measures 6.0 mmHg.  8. The inferior vena cava is dilated in size with <50% respiratory variability, suggesting right atrial pressure of 15 mmHg. Comparison(s): Changes from prior study are noted. 12/09/15: LVEF 65-70%, normal wall motion. FINDINGS  Left Ventricle: Left ventricular ejection fraction, by estimation, is 55 to 60%. The left ventricle has normal function. The left ventricle demonstrates regional wall motion abnormalities. Moderate hypokinesis of the left ventricular, basal-mid inferior  wall. Definity contrast agent was given IV to delineate the left ventricular endocardial borders. The left ventricular internal cavity size was normal in size. There is severe left ventricular hypertrophy. Left ventricular  diastolic function could not be evaluated due to atrial fibrillation. Left ventricular diastolic function could not be evaluated. Right Ventricle: The right ventricular size is normal. No increase in right ventricular wall thickness. Right ventricular systolic function is low  normal. Tricuspid regurgitation signal is inadequate for assessing PA pressure. Left Atrium: Left atrial size was severely dilated. Right Atrium: Right atrial size was mildly dilated. Pericardium: There is no evidence of pericardial effusion. Mitral Valve: The mitral valve is abnormal. There is mild thickening of the mitral valve leaflet(s). Mild mitral annular calcification. Trivial mitral valve regurgitation. Tricuspid Valve: The tricuspid valve is grossly normal. Tricuspid valve regurgitation is trivial. Aortic Valve: The aortic valve is tricuspid. Aortic valve regurgitation is not visualized. Mild aortic valve sclerosis is present, with no evidence of aortic valve stenosis. Aortic valve mean gradient measures 6.0 mmHg. Aortic valve peak gradient measures 10.9 mmHg. Aortic valve area, by VTI measures 2.21 cm. Pulmonic Valve: The pulmonic valve was grossly normal. Pulmonic valve regurgitation is trivial. Aorta: The aortic root and ascending aorta are structurally normal, with no evidence of dilitation. Venous: The inferior vena cava is dilated in size with less than 50% respiratory variability, suggesting right atrial pressure of 15 mmHg. IAS/Shunts: There is right bowing of the interatrial septum, suggestive of elevated left atrial pressure. No atrial level shunt detected by color flow Doppler. Additional Comments: There is a moderate pleural effusion in the left lateral region.  LEFT VENTRICLE PLAX 2D LVIDd:         4.70 cm LVIDs:         3.10 cm LV PW:         2.00 cm LV IVS:        1.80 cm LVOT diam:     2.00 cm LV SV:         75 LV SV Index:   36 LVOT Area:     3.14 cm  RIGHT VENTRICLE            IVC RV Basal diam:  3.70 cm    IVC  diam: 2.80 cm RV Mid diam:    4.40 cm RV S prime:     9.25 cm/s TAPSE (M-mode): 1.2 cm LEFT ATRIUM              Index       RIGHT ATRIUM           Index LA diam:        4.60 cm  2.18 cm/m  RA Area:     24.40 cm LA Vol (A2C):   103.0 ml 48.83 ml/m RA Volume:   72.90 ml  34.56 ml/m LA Vol (A4C):   102.0 ml 48.36 ml/m LA Biplane Vol: 102.0 ml 48.36 ml/m  AORTIC VALVE AV Area (Vmax):    2.25 cm AV Area (Vmean):   2.05 cm AV Area (VTI):     2.21 cm AV Vmax:           165.00 cm/s AV Vmean:          117.667 cm/s AV VTI:            0.341 m AV Peak Grad:      10.9 mmHg AV Mean Grad:      6.0 mmHg LVOT Vmax:         118.33 cm/s LVOT Vmean:        76.733 cm/s LVOT VTI:          0.240 m LVOT/AV VTI ratio: 0.70  AORTA Ao Root diam: 3.50 cm Ao Asc diam:  3.30 cm MR Peak grad: 80.6 mmHg MR Mean grad: 58.0 mmHg   SHUNTS MR Vmax:      449.00 cm/s Systemic VTI:  0.24 m  MR Vmean:     367.0 cm/s  Systemic Diam: 2.00 cm Lyman Bishop MD Electronically signed by Lyman Bishop MD Signature Date/Time: 04/20/2020/1:08:51 PM    Final    Disposition   Pt is being discharged home today in good condition.  Follow-up Plans & Appointments     Discharge Instructions    Amb Referral to Cardiac Rehabilitation   Complete by: As directed    Diagnosis:  Coronary Stents NSTEMI     After initial evaluation and assessments completed: Virtual Based Care may be provided alone or in conjunction with Phase 2 Cardiac Rehab based on patient barriers.: Yes      Discharge Medications   Allergies as of 04/23/2020      Reactions   Anoro Ellipta [umeclidinium-vilanterol] Other (See Comments)   Cardiac problems   Fenofibrate Other (See Comments)   No energy   Lipitor [atorvastatin Calcium] Other (See Comments)   Not known   Niaspan [niacin] Other (See Comments)   Not known   Zocor [simvastatin] Other (See Comments)   Not known      Medication List    TAKE these medications   ALPRAZolam 1 MG tablet Commonly known as:  XANAX Take 1 tablet (1 mg total) by mouth at bedtime as needed for anxiety. What changed: when to take this   amLODipine 5 MG tablet Commonly known as: NORVASC TAKE 1 TABLET BY MOUTH DAILY   aspirin EC 81 MG tablet Take 1 tablet (81 mg total) by mouth daily.   b complex vitamins tablet Take 1 tablet by mouth daily.   benzonatate 100 MG capsule Commonly known as: TESSALON Take 100 mg by mouth 3 (three) times daily as needed.   clopidogrel 75 MG tablet Commonly known as: PLAVIX Take 1 tablet (75 mg total) by mouth daily with breakfast. Start taking on: April 24, 2020   hydrochlorothiazide 25 MG tablet Commonly known as: HYDRODIURIL TAKE 1 TABLET BY MOUTH EVERY DAY What changed: when to take this   isosorbide mononitrate 30 MG 24 hr tablet Commonly known as: IMDUR Take 1 tablet (30 mg total) by mouth 2 (two) times daily. What changed: when to take this   losartan 50 MG tablet Commonly known as: COZAAR Take 50 mg by mouth daily.   metoprolol succinate 25 MG 24 hr tablet Commonly known as: TOPROL-XL Take 1 tablet (25 mg total) by mouth daily. Start taking on: April 24, 2020   multivitamin per tablet Take 1 tablet by mouth daily.   nitroGLYCERIN 0.4 MG SL tablet Commonly known as: NITROSTAT Place 1 tablet (0.4 mg total) under the tongue every 5 (five) minutes x 3 doses as needed for chest pain (if no relief after 3rd dose, proceed to the ED for an evaluation).   Omega 3 1000 MG Caps Take 1 capsule by mouth daily.   pantoprazole 40 MG tablet Commonly known as: PROTONIX TAKE 1 TABLET BY MOUTH EVERY DAY   potassium chloride SA 20 MEQ tablet Commonly known as: KLOR-CON TAKE 1 TABLET BY MOUTH TWICE DAILY   Stiolto Respimat 2.5-2.5 MCG/ACT Aers Generic drug: Tiotropium Bromide-Olodaterol Inhale 1 puff into the lungs 2 (two) times a day.   Ventolin HFA 108 (90 Base) MCG/ACT inhaler Generic drug: albuterol Inhale 2 puffs into the lungs 2 (two) times daily as  needed.   Vitamin D3 25 MCG (1000 UT) Caps Take 2 capsules by mouth daily.   vitamin E 1000 UNIT capsule Take 1,000 Units by mouth daily.   Zetia  10 MG tablet Generic drug: ezetimibe TAKE 1 TABLET BY MOUTH DAILY   Zinc 50 MG Caps Take 1 capsule by mouth daily.          Outstanding Labs/Studies   Check FLP outpatient.  Duration of Discharge Encounter   Greater than 30 minutes including physician time.  Signed, Abigail Butts, PA-C 04/23/2020, 1:34 PM

## 2020-04-23 NOTE — Progress Notes (Signed)
CARDIAC REHAB PHASE I   PRE:  Rate/Rhythm: 76 Afib  BP:  Sitting: 141/68      SaO2: 94 RA  MODE:  Ambulation: 250 ft   POST:  Rate/Rhythm: 90 Afib with PVCs  BP:  Sitting: 120/86    SaO2: 92 RA   Pt ambulated 24ft in hallway assist of one with front wheel walker. Pt took one short standing rest break c/o SOB. Pt coached through purse lipped breathing. Pt sat on EOB. Sats maintained on RA throughout the walk. Pt and son educated on importance of ASA and Plavix. Pt states he has previously has a GI bleed while on Plavix that needed hospitalization, and is hesitant to restart it. Pt and son educated on importance of antiplatelet medication in the setting of a stent. Pt given stent card, MI book, and heart healthy diet. Reviewed site care, restrictions, and exercise guidelines. Will refer to CRP II [place]. Pt hopeful for d/c today.  [id number] Rufina Falco, RN BSN 04/23/2020 9:23 AM

## 2020-04-23 NOTE — Telephone Encounter (Signed)
Sonia Side (son) called stating that William Elliott was being discharged from [facility] Patient is concerned that they have put him on Plavix.States that last time he was on Plavix he almost bleed out.  [phone].

## 2020-04-23 NOTE — Discharge Instructions (Signed)
Information about your medication: Plavix (anti-platelet agent)  Generic Name (Brand): clopidogrel (Plavix), once daily medication  PURPOSE: You are taking this medication along with aspirin to lower your chance of having a heart attack, stroke, or blood clots in your heart stent. These can be fatal. Plavix and aspirin help prevent platelets from sticking together and forming a clot that can block an artery or your stent.   Common SIDE EFFECTS you may experience include: bruising or bleeding more easily, shortness of breath  Do not stop taking PLAVIX without talking to the doctor who prescribes it for you. People who are treated with a stent and stop taking Plavix too soon, have a higher risk of getting a blood clot in the stent, having a heart attack, or dying. If you stop Plavix because of bleeding, or for other reasons, your risk of a heart attack or stroke may increase.   Avoid taking NSAID agents or anti-inflammatory medications such as ibuprofen, naproxen given increased bleed risk with plavix - can use acetaminophen (Tylenol) if needed for pain.  Avoid taking over the counter stomach medications omeprazole (Prilosec) or esomeprazole (Nexium) since these do interact and make plavix less effective - ask your pharmacist or doctor for alterative agents if needed for heartburn or GERD.   Tell all of your doctors and dentists that you are taking Plavix. They should talk to the doctor who prescribed Plavix for you before you have any surgery or invasive procedure.   Contact your health care provider if you experience: severe or uncontrollable bleeding, pink/red/brown urine, vomiting blood or vomit that looks like "coffee grounds", red or black stools (looks like tar), coughing up blood or blood clots ----------------------------------------------------------------------------------------------------------------------   PLEASE REMEMBER TO BRING ALL OF YOUR MEDICATIONS TO EACH OF YOUR FOLLOW-UP  OFFICE VISITS.  PLEASE ATTEND ALL SCHEDULED FOLLOW-UP APPOINTMENTS.   Activity: Increase activity slowly as tolerated. You may shower, but no soaking baths (or swimming) for 1 week. No driving for 24 hours. No lifting over 5 lbs for 1 week. No sexual activity for 1 week.     You May Return to Work: in 1 week (if applicable)  Wound Care: You may wash cath site gently with soap and water. Keep cath site clean and dry. If you notice pain, swelling, bleeding or pus at your cath site, please call [phone].

## 2020-04-24 ENCOUNTER — Telehealth: Payer: Self-pay | Admitting: Cardiology

## 2020-04-24 NOTE — Telephone Encounter (Signed)
Advised to stay on plavix for now and this can be discussed at this visit on 05/03/20. Verbalized understanding of plan.

## 2020-04-24 NOTE — Telephone Encounter (Signed)
Yes, his prior history of GI bleeding is of concern, but he just underwent DES intervention and it will be important for him to try to stay on aspirin and Plavix at this time if at all possible.

## 2020-04-24 NOTE — Telephone Encounter (Signed)
See previous phone note for details 

## 2020-04-24 NOTE — Telephone Encounter (Signed)
Patient called back requested Alma Friendly to call him back.

## 2020-04-24 NOTE — Telephone Encounter (Signed)
New message     Patient returning call to [facility] , please call when available

## 2020-04-26 ENCOUNTER — Telehealth (HOSPITAL_COMMUNITY): Payer: Self-pay

## 2020-04-26 NOTE — Telephone Encounter (Signed)
Pharmacy Transitions of Care Follow-up Telephone Call  Date of discharge: 04/23/20 Discharge Diagnosis: NSTEMI, stent placement  How have you been since you were released from the hospital? Patient has been doing well. He states he feels good and is glad to still be here. Knows s/sx of bleeding to look out for.  Medication changes made at discharge: yes  Medication changes obtained and verified? yes    Medication Accessibility:  Home Pharmacy: [facility] [address]  Was the patient provided with refills on discharged medications? yes  Have all prescriptions been transferred from [facility] to home pharmacy? yes   . Is the patient able to afford medications? yes    Medication Review:  CLOPIDOGREL (PLAVIX) Clopidogrel 75 mg once daily.  - Educated patient on expected duration of therapy of 1 year with clopidogrel. Advised patient that aspirin will be continued indefinitely.  - Advised patient of medications to avoid (NSAIDs, ASA)  - Educated that Tylenol (acetaminophen) will be the preferred analgesic to prevent risk of bleeding  - Emphasized importance of monitoring for signs and symptoms of bleeding (abnormal bruising, prolonged bleeding, nose bleeds, bleeding from gums, discolored urine, black tarry stools)  - Advised patient to alert all providers of anticoagulation therapy prior to starting a new medication or having a procedure    Follow-up Appointments:  Follow up with Dr. Leonides Sake in Cardiology scheduled for 05/03/20.   If their condition worsens, is the pt aware to call PCP or go to the Emergency Dept.? yes  Final Patient Assessment: Patient doing well. Knows s/sx of bleeding to watch out for. Has f/u scheduled.

## 2020-04-29 DIAGNOSIS — K219 Gastro-esophageal reflux disease without esophagitis: Secondary | ICD-10-CM | POA: Diagnosis not present

## 2020-04-29 DIAGNOSIS — E782 Mixed hyperlipidemia: Secondary | ICD-10-CM | POA: Diagnosis not present

## 2020-04-29 DIAGNOSIS — I10 Essential (primary) hypertension: Secondary | ICD-10-CM | POA: Diagnosis not present

## 2020-04-29 DIAGNOSIS — M791 Myalgia, unspecified site: Secondary | ICD-10-CM | POA: Diagnosis not present

## 2020-04-29 DIAGNOSIS — I480 Paroxysmal atrial fibrillation: Secondary | ICD-10-CM | POA: Diagnosis not present

## 2020-05-02 NOTE — Progress Notes (Signed)
Cardiology Office Note  Date: 05/03/2020   ID: [name], DOB 09-11-1940, MRN [id number]  PCP:  [name], MD  Cardiologist:  [name], MD Electrophysiologist:  None   Chief Complaint: Hospital follow up   History of Present Illness: William Elliott is a 80 y.o. male with permanent atrial fibrillation not on anticoagulation.  CAD status post four-vessel CABG 1999 (LIMA-LAD, SVG-diagonal, SVG-OM, SVG-RPDA), (DES to SVG-OM 2017), HTN, HLD, COPD, carotid artery stenosis, CVA, GI bleed.  Recently transferred from [facility] for chest pain evaluation 04/19/2020.  Reported intermittent episodes of chest pain radiating to back, left arm, teeth.  Aggravated by lying flat.  Associated with dyspnea.  Resolved after sublingual nitro glycerin. Troponins 27,000, NSTEMI.  Echo EF 55 to 60%.  Basal mid inferior wall motion abnormalities, severe LAE, mild RAE.  Left heart cath 04/22/2020: 99% mid body stenosis SVG to OM with placement of DES.  CTO to SVG-PDA and SVG-D1.  Patent LIMA with collaterals to the PDA.  DAPT therapy with aspirin and Plavix for minimum of 12 months with consideration to stop aspirin after 1 month.  Patient placed back on anticoagulation for his atrial fibrillation..  Home Imdur increased to 30 mg p.o. twice daily.  Started Toprol-XL 25 mg daily.  CHA2DS2-VASc score of 6.  Hesitant to restart anticoagulation due to bleeding risk..  Continuing amlodipine, Toprol, losartan, Imdur.  On Zetia and omega-3 due to statin intolerance.  He is here post hospital follow-up for cardiac catheterization status post NSTEMI.  States he is feeling well.  Denies any anginal or exertional symptoms, palpitations or arrhythmias, orthostatic symptoms, CVA or TIA-like symptoms, PND, orthopnea, bleeding.  No claudication-like symptoms, DVT or PE-like symptoms, or lower extremity edema.  He does have some venous varicosities more present in right than left.  Denies any issues with catheter access  site right groin.  Blood pressure elevated today at 148/70.  Past Medical History:  Diagnosis Date  . Acute lower GI bleeding 2008  . Anxiety   . Asthma   . Carotid artery disease ([place])    Asymptomatic left carotid bruit  . COPD (chronic obstructive pulmonary disease) ([place])   . Coronary artery disease    a. CABG - 1999 with LIMA-LAD, SVG-DIAG, SVG-OM, SVG-RPDA  b. Cath in setting of NSTEMI 03/18/2015: patent LIMA-LAD, occluded SVG-RCA, occluded SVG-D1, 99% stenosis of SVG-OM treated w/ DES  . Depression   . Dyslipidemia   . Erectile dysfunction   . Essential hypertension   . History of blood transfusion 2008  . History of stroke   . Hyperlipidemia   . Myocardial infarction [facility])     Past Surgical History:  Procedure Laterality Date  . CARDIAC CATHETERIZATION    . CARDIAC CATHETERIZATION N/A 03/18/2015   Procedure: Left Heart Cath and Cors/Grafts Angiography;  Surgeon: Leonie Man, MD;  Location: [place] [facility];  Service: Cardiovascular;  Laterality: N/A;  . CARDIAC CATHETERIZATION N/A 03/18/2015   Procedure: Coronary Stent Intervention;  Surgeon: Leonie Man, MD;  Location: [place] [facility];  Service: Cardiovascular;  Laterality: N/A;  . CATARACT EXTRACTION W/ INTRAOCULAR LENS  IMPLANT, BILATERAL Bilateral   . CORONARY ANGIOPLASTY    . CORONARY ARTERY BYPASS GRAFT  1999  . CORONARY STENT INTERVENTION N/A 04/22/2020   Procedure: CORONARY STENT INTERVENTION;  Surgeon: Belva Crome, MD;  Location: [place] [facility];  Service: Cardiovascular;  Laterality: N/A;  . FRACTURE SURGERY    . HERNIA REPAIR    .  LEFT HEART CATH AND CORS/GRAFTS ANGIOGRAPHY N/A 04/22/2020   Procedure: LEFT HEART CATH AND CORS/GRAFTS ANGIOGRAPHY;  Surgeon: Belva Crome, MD;  Location: [place] [facility];  Service: Cardiovascular;  Laterality: N/A;  . [facility]; took bone out of my right hip  . TONSILLECTOMY    . UMBILICAL HERNIA REPAIR  ~ 2000     Current Outpatient Medications  Medication Sig Dispense Refill  . ALPRAZolam (XANAX) 1 MG tablet Take 1 tablet (1 mg total) by mouth at bedtime as needed for anxiety. (Patient taking differently: Take 1 mg by mouth 2 (two) times daily as needed for anxiety.) 10 tablet 0  . amLODipine (NORVASC) 5 MG tablet TAKE 1 TABLET BY MOUTH DAILY 90 tablet 2  . aspirin EC 81 MG tablet Take 1 tablet (81 mg total) by mouth daily. 90 tablet 3  . b complex vitamins tablet Take 1 tablet by mouth daily.    . benzonatate (TESSALON) 100 MG capsule Take 100 mg by mouth 3 (three) times daily as needed.    . Cholecalciferol (VITAMIN D3) 25 MCG (1000 UT) CAPS Take 2 capsules by mouth daily.    . clopidogrel (PLAVIX) 75 MG tablet Take 1 tablet (75 mg total) by mouth daily with breakfast. 90 tablet 3  . hydrochlorothiazide (HYDRODIURIL) 25 MG tablet TAKE 1 TABLET BY MOUTH EVERY DAY (Patient taking differently: Take 25 mg by mouth every other day.) 90 tablet 1  . isosorbide mononitrate (IMDUR) 30 MG 24 hr tablet Take 1 tablet (30 mg total) by mouth 2 (two) times daily. 180 tablet 3  . losartan (COZAAR) 50 MG tablet Take 50 mg by mouth daily.    . metoprolol succinate (TOPROL-XL) 25 MG 24 hr tablet Take 1 tablet (25 mg total) by mouth daily. 90 tablet 3  . multivitamin (THERAGRAN) per tablet Take 1 tablet by mouth daily.    . nitroGLYCERIN (NITROSTAT) 0.4 MG SL tablet Place 1 tablet (0.4 mg total) under the tongue every 5 (five) minutes x 3 doses as needed for chest pain (if no relief after 3rd dose, proceed to the ED for an evaluation). 25 tablet 3  . Omega 3 1000 MG CAPS Take 1 capsule by mouth daily.    . pantoprazole (PROTONIX) 40 MG tablet TAKE 1 TABLET BY MOUTH EVERY DAY 90 tablet 1  . potassium chloride SA (KLOR-CON) 20 MEQ tablet TAKE 1 TABLET BY MOUTH TWICE DAILY 60 tablet 6  . Tiotropium Bromide-Olodaterol (STIOLTO RESPIMAT) 2.5-2.5 MCG/ACT AERS Inhale 1 puff into the lungs 2 (two) times a day.    . VENTOLIN  HFA 108 (90 Base) MCG/ACT inhaler Inhale 2 puffs into the lungs 2 (two) times daily as needed.    . vitamin E 1000 UNIT capsule Take 1,000 Units by mouth daily.    Marland Kitchen ZETIA 10 MG tablet TAKE 1 TABLET BY MOUTH DAILY 30 tablet 6  . Zinc 50 MG CAPS Take 1 capsule by mouth daily.     No current facility-administered medications for this visit.   Allergies:  Anoro ellipta [umeclidinium-vilanterol], Fenofibrate, Lipitor [atorvastatin calcium], Niaspan [niacin], and Zocor [simvastatin]   Social History: The patient  reports that he quit smoking about 23 years ago. His smoking use included cigars. He quit after 10.00 years of use. He has never used smokeless tobacco. He reports that he does not drink alcohol and does not use drugs.   Family History: The patient's family history includes Heart attack  in his father; Heart disease in his father; Hypertension in his mother; Stroke in his mother.   ROS:  Please see the history of present illness. Otherwise, complete review of systems is positive for none.  All other systems are reviewed and negative.   Physical Exam: VS:  BP (!) 148/70   Pulse (!) 56   Ht _0  (1.778 m)   Wt 200 lb 12.8 oz (91.1 kg)   SpO2 98%   BMI 28.81 kg/m , BMI Body mass index is 28.81 kg/m.  Wt Readings from Last 3 Encounters:  05/03/20 200 lb 12.8 oz (91.1 kg)  04/19/20 205 lb (93 kg)  02/09/19 205 lb (93 kg)    General: Patient appears comfortable at rest. Neck: Supple, no elevated JVP or carotid bruits, no thyromegaly. Lungs: Clear to auscultation, nonlabored breathing at rest. Cardiac: Regular rate and rhythm, no S3 or significant systolic murmur, no pericardial rub. Extremities: No pitting edema, distal pulses 2+. Skin: Warm and dry. Musculoskeletal: No kyphosis. Neuropsychiatric: Alert and oriented x3, affect grossly appropriate.  ECG:  EKG April 23, 2020 atrial fibrillation with competing junctional pacemaker or premature ventricular or aberrantly conducted  complexes, nonspecific ST abnormality rate of 73.  Recent Labwork: 04/20/2020: B Natriuretic Peptide 662.3; BUN 10; Creatinine, Ser 0.86; Magnesium 1.9; Potassium 4.2; Sodium 134 04/22/2020: Hemoglobin 10.8; Platelets 340     Component Value Date/Time   CHOL 176 12/09/2015 0616   TRIG 157 (H) 12/09/2015 0616   HDL 30 (L) 12/09/2015 0616   CHOLHDL 5.9 12/09/2015 0616   VLDL 31 12/09/2015 0616   LDLCALC 115 (H) 12/09/2015 0616   LDLDIRECT 117.0 04/20/2014 1303    Other Studies Reviewed Today:   Left heart catheterization 04/22/20: CORONARY STENT INTERVENTION  LEFT HEART CATH AND CORS/GRAFTS ANGIOGRAPHY    Conclusion    A stent was successfully placed.   99% mid body stenosis in SVG to obtuse marginal reduced to 10% with TIMI grade III flow using a 4.0 x 22 Onyx deployed at 12 atm. This is felt to be the culprit lesion for the patient's non-ST elevation MI.(The previously placed ostial to proximal saphenous vein to OM stent overhangs into the ascending aorta preventing engagement with extra-support guide catheters. An internal mammary guide catheter was used to coaxially aim and advanced the guidewire into the graft).  Chronic occlusion of SVG to PDA.  Chronic occlusion of SVG to diagonal.  Patent LIMA to the mid to distal LAD. Supplies collaterals to the PDA.  Total occlusion of the mid LAD. 95% ostial LAD stenosis with only septal perforator and a small diagonal at risk.  Total occlusion of the mid circumflex  Total occlusion of the mid RCA. PDA is supplied by LAD collaterals when the LIMA graft to the LAD is visualized.  Echocardiogram documents preserved LV systolic function, EF greater than 50%. LVEDP is normal, 7 mmHg.  RECOMMENDATIONS:   Aspirin and Plavix for 12 months.  If decision to use anticoagulation for management of atrial fibrillation is made, would recommend discontinuing aspirin after 1 month and continuing Plavix to complete 12  months  Diagnostic Dominance: Co-dominant    Intervention           Echocardiogram 04/20/20: 1. Left ventricular ejection fraction, by estimation, is 55 to 60%. The  left ventricle has normal function. The left ventricle demonstrates  regional wall motion abnormalities (see scoring diagram/findings for  description). There is severe left  ventricular hypertrophy. Left ventricular diastolic function could not be  evaluated. There is moderate hypokinesis of the left ventricular,  basal-mid inferior wall.  2. Right ventricular systolic function is low normal. The right  ventricular size is normal. Tricuspid regurgitation signal is inadequate  for assessing PA pressure.  3. Left atrial size was severely dilated.  4. Right atrial size was mildly dilated.  5. Moderate pleural effusion in the left lateral region.  6. The mitral valve is abnormal. Trivial mitral valve regurgitation.  7. The aortic valve is tricuspid. Aortic valve regurgitation is not  visualized. Mild aortic valve sclerosis is present, with no evidence of  aortic valve stenosis. Aortic valve mean gradient measures 6.0 mmHg.  8. The inferior vena cava is dilated in size with <50% respiratory  variability, suggesting right atrial pressure of 15 mmHg.    Carotid Dopplers 02/17/2018: Summary: Right Carotid: Velocities in the right ICA are consistent with a 1-39% stenosis. Non-hemodynamically significant plaque <50% noted in the CCA.  Left Carotid: Velocities in the left ICA are consistent with a 1-39% stenosis. Non-hemodynamically significant plaque noted in the CCA. The ECA appears >50% stenosed.  Vertebrals: Bilateral vertebral arteries demonstrate antegrade flow. Subclavians: Normal flow hemodynamics were seen in bilateral subclavian arteries.  Echocardiogram 12/09/2015: Study Conclusions  - Left ventricle: The cavity size was normal.  There was severe concentric hypertrophy. Systolic function was vigorous. The estimated ejection fraction was in the range of 65% to 70%. Wall motion was normal; there were no regional wall motion abnormalities. The study was not technically sufficient to allow evaluation of LV diastolic dysfunction due to atrial fibrillation. - Aortic valve: Trileaflet; mildly thickened, mildly calcified leaflets. Transvalvular velocity was minimally increased. There was no stenosis. There was mild regurgitation. - Ascending aorta: The ascending aorta was normal in size. - Mitral valve: Mildly thickened leaflets . There was mild regurgitation. - Left atrium: The atrium was mildly dilated. - Right ventricle: Systolic function was normal. - Right atrium: The atrium was normal in size. - Tricuspid valve: Structurally normal valve. There was trivial regurgitation. - Pulmonic valve: Structurally normal valve. - Inferior vena cava: The vessel was normal in size. - Pericardium, extracardiac: There was no pericardial effusion.   Assessment and Plan:  1. NSTEMI (non-ST elevated myocardial infarction) ([place])   2. Permanent atrial fibrillation ([place])   3. Mixed hyperlipidemia   4. Essential hypertension   5. Chronic obstructive pulmonary disease, unspecified COPD type ([place])     1. NSTEMI / Coronary artery disease involving native coronary artery of native heart with angina pectoris [facility]) Recent NSTEMI with subsequent cardiac catheterization.  See cardiac cath report above.  Right groin access site clean and dry with no swelling redness, discoloration with good pulses.  Denies any anginal or exertional symptoms.  Continue aspirin 81 mg daily.  Continue Plavix 75 mg p.o. daily.  Continue Imdur 30 mg p.o. twice daily.  Continue Toprol 25 mg daily.  Continue sublingual nitroglycerin p.o. daily.  2. Permanent atrial fibrillation [facility]) Patient denies any palpitations or arrhythmias.  Heart rate  today was 56 .  Currently on DAPT therapy with aspirin and Plavix status post recent NSTEMI.  Continues to defer anticoagulation due to history of GI bleeding.   3. Mixed hyperlipidemia Intolerant of statin medication.  Currently on Zetia only.  Last LDL 115, direct LDL 117.  Repeat FLP and LFTs requested post hospital discharge from recent hospital stay from NSTEMI for recheck.  4.  Essential hypertension Blood pressure elevated today.  148/70.  Continue amlodipine 5 mg daily.  Continue HCTZ 25 mg daily.  Continue losartan 50 mg daily.  Continue Toprol-XL 25 mg daily.  Continue to monitor blood pressure at home.  Goal is 138/80 or less.  If sustained blood pressures greater than 130/80 please call and let us know.  We may need to adjust some with her antihypertensive medication.  Patient and wife verbalized understanding.  5.  COPD Patient denies any significant DOE, shortness of breath.  He has metered-dose inhalers Stiolto Respimat and Ventolin HFA.  Medication Adjustments/Labs and Tests Ordered: Current medicines are reviewed at length with the patient today.  Concerns regarding medicines are outlined above.   Disposition: Follow-up with Domenic Polite or APP 6 months.  Signed, Levell July, NP 05/03/2020 10:26 AM    [facility] [place], [place], [place] [address] Phone: [phone]; Fax: [phone]

## 2020-05-03 ENCOUNTER — Encounter: Payer: Self-pay | Admitting: Family Medicine

## 2020-05-03 ENCOUNTER — Ambulatory Visit (INDEPENDENT_AMBULATORY_CARE_PROVIDER_SITE_OTHER): Payer: Medicare Other | Admitting: Family Medicine

## 2020-05-03 VITALS — BP 148/70 | HR 56 | Ht 70.0 in | Wt 200.8 lb

## 2020-05-03 DIAGNOSIS — I4821 Permanent atrial fibrillation: Secondary | ICD-10-CM

## 2020-05-03 DIAGNOSIS — J449 Chronic obstructive pulmonary disease, unspecified: Secondary | ICD-10-CM | POA: Diagnosis not present

## 2020-05-03 DIAGNOSIS — E782 Mixed hyperlipidemia: Secondary | ICD-10-CM | POA: Diagnosis not present

## 2020-05-03 DIAGNOSIS — I214 Non-ST elevation (NSTEMI) myocardial infarction: Secondary | ICD-10-CM | POA: Diagnosis not present

## 2020-05-03 DIAGNOSIS — I10 Essential (primary) hypertension: Secondary | ICD-10-CM | POA: Diagnosis not present

## 2020-05-03 NOTE — Patient Instructions (Signed)
Medication Instructions:  Continue all current medications.  Labwork: FLP, LFT - due in 6-8 weeks - will mail reminder when time.  Testing/Procedures: none  Follow-Up: 6 months   Any Other Special Instructions Will Be Listed Below (If Applicable).  If you need a refill on your cardiac medications before your next appointment, please call your pharmacy.

## 2020-05-15 ENCOUNTER — Ambulatory Visit: Payer: Medicare Other | Admitting: Cardiology

## 2020-06-24 ENCOUNTER — Encounter: Payer: Self-pay | Admitting: *Deleted

## 2020-06-24 ENCOUNTER — Other Ambulatory Visit: Payer: Self-pay | Admitting: *Deleted

## 2020-06-24 DIAGNOSIS — E782 Mixed hyperlipidemia: Secondary | ICD-10-CM

## 2020-06-24 DIAGNOSIS — I25119 Atherosclerotic heart disease of native coronary artery with unspecified angina pectoris: Secondary | ICD-10-CM

## 2020-06-30 DIAGNOSIS — K219 Gastro-esophageal reflux disease without esophagitis: Secondary | ICD-10-CM | POA: Diagnosis not present

## 2020-07-17 DIAGNOSIS — E782 Mixed hyperlipidemia: Secondary | ICD-10-CM | POA: Diagnosis not present

## 2020-07-19 ENCOUNTER — Other Ambulatory Visit (HOSPITAL_COMMUNITY): Payer: Self-pay

## 2020-07-23 ENCOUNTER — Encounter: Payer: Self-pay | Admitting: *Deleted

## 2020-08-04 ENCOUNTER — Other Ambulatory Visit: Payer: Self-pay | Admitting: Cardiology

## 2020-08-04 DIAGNOSIS — I119 Hypertensive heart disease without heart failure: Secondary | ICD-10-CM

## 2020-08-08 DIAGNOSIS — K219 Gastro-esophageal reflux disease without esophagitis: Secondary | ICD-10-CM | POA: Diagnosis not present

## 2020-08-13 ENCOUNTER — Encounter: Payer: Self-pay | Admitting: *Deleted

## 2020-09-02 ENCOUNTER — Other Ambulatory Visit: Payer: Self-pay | Admitting: Cardiology

## 2020-10-30 ENCOUNTER — Other Ambulatory Visit: Payer: Self-pay | Admitting: Cardiology

## 2020-11-18 ENCOUNTER — Encounter: Payer: Self-pay | Admitting: Cardiology

## 2020-11-18 ENCOUNTER — Other Ambulatory Visit: Payer: Self-pay

## 2020-11-18 ENCOUNTER — Ambulatory Visit (INDEPENDENT_AMBULATORY_CARE_PROVIDER_SITE_OTHER): Payer: Medicare Other | Admitting: Cardiology

## 2020-11-18 VITALS — BP 130/70 | HR 47 | Ht 70.0 in | Wt 209.6 lb

## 2020-11-18 DIAGNOSIS — T466X5A Adverse effect of antihyperlipidemic and antiarteriosclerotic drugs, initial encounter: Secondary | ICD-10-CM

## 2020-11-18 DIAGNOSIS — M791 Myalgia, unspecified site: Secondary | ICD-10-CM

## 2020-11-18 DIAGNOSIS — I4821 Permanent atrial fibrillation: Secondary | ICD-10-CM

## 2020-11-18 DIAGNOSIS — E782 Mixed hyperlipidemia: Secondary | ICD-10-CM | POA: Diagnosis not present

## 2020-11-18 DIAGNOSIS — I25119 Atherosclerotic heart disease of native coronary artery with unspecified angina pectoris: Secondary | ICD-10-CM | POA: Diagnosis not present

## 2020-11-18 MED ORDER — METOPROLOL SUCCINATE ER 25 MG PO TB24: 12.5000 mg | ORAL_TABLET | Freq: Every day | ORAL | Status: DC

## 2020-11-18 NOTE — Progress Notes (Signed)
Cardiology Office Note  Date: 11/18/2020   ID: William Elliott, DOB 05-23-1940, MRN [id number]  PCP:  William Squibb, MD  Cardiologist:  William Lesches, MD Electrophysiologist:  None   Chief Complaint  Patient presents with   Cardiac follow-up    History of Present Illness: William Elliott is a 80 y.o. male last seen in March by [name] NP.  He is here for a follow-up visit.  He does not report any active angina at this time, does state that he feels dizzy sometimes.  He is bradycardic today, is on Toprol-XL since I last saw him and his heart rate is in the 40s to 50s at rest.  He has been trying to do some walking around his apartment for exercise.  He has declined long-term anticoagulation over time given concerns about recurrent GI bleeding.  This remains the case.  He is however tolerating aspirin and Plavix since PCI back in February.  I reviewed his cardiac testing from February as noted below.  He will see Dr. Nevada Elliott later this year for repeat lab work.  Past Medical History:  Diagnosis Date   Acute lower GI bleeding 2008   Anxiety    Asthma    Carotid artery disease (HCC)    Asymptomatic left carotid bruit   COPD (chronic obstructive pulmonary disease) (HCC)    Coronary artery disease    a. CABG - 1999 with LIMA-LAD, SVG-DIAG, SVG-OM, SVG-RPDA  b. Cath in setting of NSTEMI 03/18/2015: patent LIMA-LAD, occluded SVG-RCA, occluded SVG-D1, 99% stenosis of SVG-OM treated w/ DES   Depression    Dyslipidemia    Erectile dysfunction    Essential hypertension    History of blood transfusion 2008   History of stroke    Hyperlipidemia    Myocardial infarction [facility])     Past Surgical History:  Procedure Laterality Date   CARDIAC CATHETERIZATION     CARDIAC CATHETERIZATION N/A 03/18/2015   Procedure: Left Heart Cath and Cors/Grafts Angiography;  Surgeon: William Man, MD;  Location: [place] [facility];  Service: Cardiovascular;  Laterality: N/A;   CARDIAC CATHETERIZATION  N/A 03/18/2015   Procedure: Coronary Stent Intervention;  Surgeon: William Man, MD;  Location: [place] [facility];  Service: Cardiovascular;  Laterality: N/A;   CATARACT EXTRACTION W/ INTRAOCULAR LENS  IMPLANT, BILATERAL Bilateral    CORONARY ANGIOPLASTY     CORONARY ARTERY BYPASS GRAFT  1999   CORONARY STENT INTERVENTION N/A 04/22/2020   Procedure: CORONARY STENT INTERVENTION;  Surgeon: William Crome, MD;  Location: [place] [facility];  Service: Cardiovascular;  Laterality: N/A;   FRACTURE SURGERY     HERNIA REPAIR     LEFT HEART CATH AND CORS/GRAFTS ANGIOGRAPHY N/A 04/22/2020   Procedure: LEFT HEART CATH AND CORS/GRAFTS ANGIOGRAPHY;  Surgeon: William Crome, MD;  Location: [place] [facility];  Service: Cardiovascular;  Laterality: N/A;   [facility]; took bone out of my right hip   TONSILLECTOMY     UMBILICAL HERNIA REPAIR  ~ 2000    Current Outpatient Medications  Medication Sig Dispense Refill   ALPRAZolam (XANAX) 1 MG tablet Take 1 tablet (1 mg total) by mouth at bedtime as needed for anxiety. (Patient taking differently: Take 1 mg by mouth 2 (two) times daily as needed for anxiety.) 10 tablet 0   amLODipine (NORVASC) 5 MG tablet TAKE 1 TABLET BY MOUTH DAILY 90 tablet 2  aspirin EC 81 MG tablet Take 1 tablet (81 mg total) by mouth daily. 90 tablet 3   b complex vitamins tablet Take 1 tablet by mouth daily.     Cholecalciferol (VITAMIN D3) 25 MCG (1000 UT) CAPS Take 2 capsules by mouth daily.     clopidogrel (PLAVIX) 75 MG tablet Take 1 tablet (75 mg total) by mouth daily with breakfast. 90 tablet 3   hydrochlorothiazide (HYDRODIURIL) 25 MG tablet TAKE 1 TABLET BY MOUTH EVERY DAY 90 tablet 1   isosorbide mononitrate (IMDUR) 30 MG 24 hr tablet TAKE 1 TABLET BY MOUTH TWICE DAILY 60 tablet 6   losartan (COZAAR) 50 MG tablet Take 50 mg by mouth daily.     multivitamin (THERAGRAN) per tablet Take 1 tablet by mouth daily.     nitroGLYCERIN  (NITROSTAT) 0.4 MG SL tablet Place 1 tablet (0.4 mg total) under the tongue every 5 (five) minutes x 3 doses as needed for chest pain (if no relief after 3rd dose, proceed to the ED for an evaluation). 25 tablet 3   Omega 3 1000 MG CAPS Take 1 capsule by mouth daily.     pantoprazole (PROTONIX) 40 MG tablet TAKE 1 TABLET BY MOUTH EVERY DAY 90 tablet 1   potassium chloride SA (KLOR-CON) 20 MEQ tablet TAKE 1 TABLET BY MOUTH TWICE DAILY 60 tablet 6   Tiotropium Bromide-Olodaterol (STIOLTO RESPIMAT) 2.5-2.5 MCG/ACT AERS Inhale 1 puff into the lungs 2 (two) times a day.     VENTOLIN HFA 108 (90 Base) MCG/ACT inhaler Inhale 2 puffs into the lungs 2 (two) times daily as needed.     vitamin E 1000 UNIT capsule Take 1,000 Units by mouth daily.     ZETIA 10 MG tablet TAKE 1 TABLET BY MOUTH DAILY 30 tablet 6   Zinc 50 MG CAPS Take 1 capsule by mouth daily.     metoprolol succinate (TOPROL-XL) 25 MG 24 hr tablet Take 0.5 tablets (12.5 mg total) by mouth daily.     No current facility-administered medications for this visit.   Allergies:  Anoro ellipta [umeclidinium-vilanterol], Fenofibrate, Lipitor [atorvastatin calcium], Niaspan [niacin], and Zocor [simvastatin]   ROS: Hearing loss.  No orthopnea or PND.  No syncope.  Physical Exam: VS:  BP 130/70 (BP Location: Left Arm, Patient Position: Sitting, Cuff Size: Normal)   Pulse (!) 47   Ht $R'5\' 10"'Zh$  (1.778 m)   Wt 209 lb 9.6 oz (95.1 kg)   SpO2 98%   BMI 30.07 kg/m , BMI Body mass index is 30.07 kg/m.  Wt Readings from Last 3 Encounters:  11/18/20 209 lb 9.6 oz (95.1 kg)  05/03/20 200 lb 12.8 oz (91.1 kg)  04/19/20 205 lb (93 kg)    General: Elderly male, appears comfortable at rest. HEENT: Conjunctiva and lids normal, wearing a mask. Neck: Supple, no elevated JVP or carotid bruits, no thyromegaly. Lungs: Clear to auscultation, nonlabored breathing at rest. Cardiac: Irregularly irregular, no S3 or significant systolic murmur. Extremities: No  pitting edema.  ECG:  An ECG dated 04/23/2020 was personally reviewed today and demonstrated:  Atrial fibrillation with PVC and diffuse nonspecific ST-T changes.  Recent Labwork: 04/20/2020: B Natriuretic Peptide 662.3; BUN 10; Creatinine, Ser 0.86; Magnesium 1.9; Potassium 4.2; Sodium 134 04/22/2020: Hemoglobin 10.8; Platelets 340  May 2022: AST 23, ALT 20, cholesterol 177, triglycerides 127, HDL 30, LDL 124  Other Studies Reviewed Today:  Echocardiogram 04/20/2020:  1. Left ventricular ejection fraction, by estimation, is 55 to 60%. The  left  ventricle has normal function. The left ventricle demonstrates  regional wall motion abnormalities (see scoring diagram/findings for  description). There is severe left  ventricular hypertrophy. Left ventricular diastolic function could not be  evaluated. There is moderate hypokinesis of the left ventricular,  basal-mid inferior wall.   2. Right ventricular systolic function is low normal. The right  ventricular size is normal. Tricuspid regurgitation signal is inadequate  for assessing PA pressure.   3. Left atrial size was severely dilated.   4. Right atrial size was mildly dilated.   5. Moderate pleural effusion in the left lateral region.   6. The mitral valve is abnormal. Trivial mitral valve regurgitation.   7. The aortic valve is tricuspid. Aortic valve regurgitation is not  visualized. Mild aortic valve sclerosis is present, with no evidence of  aortic valve stenosis. Aortic valve mean gradient measures 6.0 mmHg.   8. The inferior vena cava is dilated in size with <50% respiratory  variability, suggesting right atrial pressure of 15 mmHg.   Cardiac catheterization 04/22/2020: A stent was successfully placed.   99% mid body stenosis in SVG to obtuse marginal reduced to 10% with TIMI grade III flow using a 4.0 x 22 Onyx deployed at 12 atm.  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 (The previously placed  ostial to proximal saphenous vein to OM stent overhangs into the ascending aorta preventing engagement with extra-support guide catheters.  An internal mammary guide catheter was used to coaxially aim and advanced the guidewire into the graft). Chronic occlusion of SVG to PDA. Chronic occlusion of SVG to diagonal. Patent LIMA to the mid to distal LAD.  Supplies collaterals to the PDA. Total occlusion of the mid LAD.  95% ostial LAD stenosis with only septal perforator and a small diagonal at risk. Total occlusion of the mid circumflex Total occlusion of the mid RCA.  PDA is supplied by LAD collaterals when the LIMA graft to the LAD is visualized. Echocardiogram documents preserved LV systolic function, EF greater than 50%.  LVEDP is normal, 7 mmHg.   RECOMMENDATIONS:   Aspirin and Plavix for 12 months. If decision to use anticoagulation for management of atrial fibrillation is made, would recommend discontinuing aspirin after 1 month and continuing Plavix to complete 12 months  Assessment and Plan:  1.  Permanent atrial fibrillation.  CHA2DS2-VASc score is 6, he declines long-term anticoagulation with concerns about previous GI bleeding.  Given slow heart rate on Toprol-XL we will reduce the dose to 12.5 mg daily, may need to stop it altogether.  Continue observation.  2.  Multivessel CAD status post CABG with graft disease and DES to the SVG to OM in 2017.  He is status post NSTEMI in February of this year with culprit lesion involving the SVG to OM requiring DES intervention.  He also has chronic occlusion of the SVG to PDA and SVG to diagonal with patent LIMA to LAD.  Continue aspirin and Plavix.  He is also on losartan, Imdur, Norvasc, Zetia, and Toprol-XL.  3.  Mixed hyperlipidemia with statin intolerance.  He is on Zetia and declines other medications.  Last LDL was 124.  Medication Adjustments/Labs and Tests Ordered: Current medicines are reviewed at length with the patient today.   Concerns regarding medicines are outlined above.   Tests Ordered: No orders of the defined types were placed in this encounter.   Medication Changes: Meds ordered this encounter  Medications   metoprolol succinate (  TOPROL-XL) 25 MG 24 hr tablet    Sig: Take 0.5 tablets (12.5 mg total) by mouth daily.    Dose decreased 11/18/2020     Disposition:  Follow up  6 months.  Signed, Satira Sark, MD, [facility] 11/18/2020 2:14 PM    [facility] [place], [place], [place] [address] Phone: [phone]; Fax: [phone]

## 2020-11-18 NOTE — Patient Instructions (Signed)
Medication Instructions:  Decrease Toprol XL to 12.5mg  daily.  Continue all other medications.     Labwork: none  Testing/Procedures: none  Follow-Up: 6 months    Any Other Special Instructions Will Be Listed Below (If Applicable).   If you need a refill on your cardiac medications before your next appointment, please call your pharmacy.

## 2020-11-21 DIAGNOSIS — H905 Unspecified sensorineural hearing loss: Secondary | ICD-10-CM | POA: Diagnosis not present

## 2021-01-08 ENCOUNTER — Telehealth: Payer: Self-pay | Admitting: Cardiology

## 2021-01-08 NOTE — Telephone Encounter (Signed)
Pt c/o medication issue:  1. Name of Medication: Clopidogrel  2. How are you currently taking this medication (dosage and times per day)? 1 time a day  3. Are you having a reaction (difficulty breathing--STAT)?   4. What is your medication issue? It make him start bleeding- he stopped taking it about a month ago- Patient says he really want to talk directly to Dr Domenic Polite about this please

## 2021-01-08 NOTE — Telephone Encounter (Signed)
Reports stopping plavix 1 month ago due to rectal bleeding. Reports rectal bleeding resolved after stopping plavix. Says he did not contact his GI doctor r/e rectal bleeding. Advised that it was recommended that he stay on plavix for one year after PCI/DES in 04/2020. Discussed increased risk of forming clots around stents without taking plavix as suggested.

## 2021-01-08 NOTE — Telephone Encounter (Signed)
Patient informed and verbalized understanding but says he declines restarting plavix

## 2021-02-26 DIAGNOSIS — R062 Wheezing: Secondary | ICD-10-CM | POA: Diagnosis not present

## 2021-02-26 DIAGNOSIS — J449 Chronic obstructive pulmonary disease, unspecified: Secondary | ICD-10-CM | POA: Diagnosis not present

## 2021-03-02 ENCOUNTER — Other Ambulatory Visit: Payer: Self-pay | Admitting: Cardiology

## 2021-03-02 DIAGNOSIS — I119 Hypertensive heart disease without heart failure: Secondary | ICD-10-CM

## 2021-03-30 ENCOUNTER — Other Ambulatory Visit: Payer: Self-pay | Admitting: Medical

## 2021-04-03 ENCOUNTER — Other Ambulatory Visit: Payer: Self-pay | Admitting: Cardiology

## 2021-05-19 DIAGNOSIS — J449 Chronic obstructive pulmonary disease, unspecified: Secondary | ICD-10-CM | POA: Diagnosis not present

## 2021-05-19 DIAGNOSIS — R7301 Impaired fasting glucose: Secondary | ICD-10-CM | POA: Diagnosis not present

## 2021-05-19 DIAGNOSIS — R600 Localized edema: Secondary | ICD-10-CM | POA: Diagnosis not present

## 2021-05-19 DIAGNOSIS — I10 Essential (primary) hypertension: Secondary | ICD-10-CM | POA: Diagnosis not present

## 2021-05-23 DIAGNOSIS — I10 Essential (primary) hypertension: Secondary | ICD-10-CM | POA: Diagnosis not present

## 2021-05-23 DIAGNOSIS — R600 Localized edema: Secondary | ICD-10-CM | POA: Diagnosis not present

## 2021-05-23 DIAGNOSIS — J449 Chronic obstructive pulmonary disease, unspecified: Secondary | ICD-10-CM | POA: Diagnosis not present

## 2021-05-23 DIAGNOSIS — R7301 Impaired fasting glucose: Secondary | ICD-10-CM | POA: Diagnosis not present

## 2021-05-23 DIAGNOSIS — D509 Iron deficiency anemia, unspecified: Secondary | ICD-10-CM | POA: Diagnosis not present

## 2021-05-27 ENCOUNTER — Telehealth: Payer: Self-pay | Admitting: Cardiology

## 2021-05-27 NOTE — Telephone Encounter (Signed)
Noted  

## 2021-05-27 NOTE — Telephone Encounter (Signed)
Patient calling to say that he will have his blood work sent to our office for Dr. Domenic Polite to see. Please advise ?

## 2021-05-28 ENCOUNTER — Encounter: Payer: Self-pay | Admitting: *Deleted

## 2021-05-29 ENCOUNTER — Ambulatory Visit (INDEPENDENT_AMBULATORY_CARE_PROVIDER_SITE_OTHER): Payer: Medicare Other | Admitting: Cardiology

## 2021-05-29 ENCOUNTER — Other Ambulatory Visit: Payer: Self-pay | Admitting: Cardiology

## 2021-05-29 ENCOUNTER — Encounter: Payer: Self-pay | Admitting: Cardiology

## 2021-05-29 VITALS — BP 152/70 | HR 47 | Ht 70.0 in | Wt 211.0 lb

## 2021-05-29 DIAGNOSIS — I25119 Atherosclerotic heart disease of native coronary artery with unspecified angina pectoris: Secondary | ICD-10-CM | POA: Diagnosis not present

## 2021-05-29 DIAGNOSIS — M791 Myalgia, unspecified site: Secondary | ICD-10-CM | POA: Diagnosis not present

## 2021-05-29 DIAGNOSIS — I4821 Permanent atrial fibrillation: Secondary | ICD-10-CM

## 2021-05-29 DIAGNOSIS — T466X5A Adverse effect of antihyperlipidemic and antiarteriosclerotic drugs, initial encounter: Secondary | ICD-10-CM

## 2021-05-29 DIAGNOSIS — E782 Mixed hyperlipidemia: Secondary | ICD-10-CM

## 2021-05-29 DIAGNOSIS — T466X5D Adverse effect of antihyperlipidemic and antiarteriosclerotic drugs, subsequent encounter: Secondary | ICD-10-CM

## 2021-05-29 NOTE — Progress Notes (Signed)
? ? ?Cardiology Office Note ? ?Date: 05/29/2021  ? ?ID: William Elliott, DOB 05/15/1940, MRN [id number] ? ?PCP:  [name], MD  ?Cardiologist:  [name], MD ?Electrophysiologist:  None  ? ?Chief Complaint  ?Patient presents with  ? Cardiac follow-up  ? ? ?History of Present Illness: ?William Elliott is a 81 y.o. male last seen in September 2022.  He is here for a routine visit.  He does not report any active angina or nitroglycerin use, describes NYHA class II dyspnea with typical ADLs.  He does not report any sense of palpitations and has had no sudden dizziness or syncope. ? ?I reviewed his medications which are outlined below and stable from a cardiac perspective.  I personally reviewed his ECG today which shows atrial fibrillation at 57 bpm with intermittent PVCs, upright in the inferior leads. ? ?I reviewed his recent lab work as outlined below. ? ?Past Medical History:  ?Diagnosis Date  ? Acute lower GI bleeding 2008  ? Anxiety   ? Asthma   ? Carotid artery disease ([place])   ? Asymptomatic left carotid bruit  ? COPD (chronic obstructive pulmonary disease) ([place])   ? Coronary artery disease   ? a. CABG - 1999 with LIMA-LAD, SVG-DIAG, SVG-OM, SVG-RPDA  b. Cath in setting of NSTEMI 03/18/2015: patent LIMA-LAD, occluded SVG-RCA, occluded SVG-D1, 99% stenosis of SVG-OM treated w/ DES  ? Depression   ? Dyslipidemia   ? Erectile dysfunction   ? Essential hypertension   ? History of blood transfusion 2008  ? History of stroke   ? Hyperlipidemia   ? Myocardial infarction [facility])   ? ? ?Past Surgical History:  ?Procedure Laterality Date  ? CARDIAC CATHETERIZATION    ? CARDIAC CATHETERIZATION N/A 03/18/2015  ? Procedure: Left Heart Cath and Cors/Grafts Angiography;  Surgeon: Leonie Man, MD;  Location: [place] [facility];  Service: Cardiovascular;  Laterality: N/A;  ? CARDIAC CATHETERIZATION N/A 03/18/2015  ? Procedure: Coronary Stent Intervention;  Surgeon: Leonie Man, MD;  Location: [place] [facility];   Service: Cardiovascular;  Laterality: N/A;  ? CATARACT EXTRACTION W/ INTRAOCULAR LENS  IMPLANT, BILATERAL Bilateral   ? CORONARY ANGIOPLASTY    ? CORONARY ARTERY BYPASS GRAFT  1999  ? CORONARY STENT INTERVENTION N/A 04/22/2020  ? Procedure: CORONARY STENT INTERVENTION;  Surgeon: Belva Crome, MD;  Location: [place] [facility];  Service: Cardiovascular;  Laterality: N/A;  ? FRACTURE SURGERY    ? HERNIA REPAIR    ? LEFT HEART CATH AND CORS/GRAFTS ANGIOGRAPHY N/A 04/22/2020  ? Procedure: LEFT HEART CATH AND CORS/GRAFTS ANGIOGRAPHY;  Surgeon: Belva Crome, MD;  Location: [place] [facility];  Service: Cardiovascular;  Laterality: N/A;  ? [place]   ? [facility]; took bone out of my right hip  ? TONSILLECTOMY    ? UMBILICAL HERNIA REPAIR  ~ 2000  ? ? ?Current Outpatient Medications  ?Medication Sig Dispense Refill  ? ALPRAZolam (XANAX) 1 MG tablet Take 1 tablet (1 mg total) by mouth at bedtime as needed for anxiety. (Patient taking differently: Take 1 mg by mouth 2 (two) times daily as needed for anxiety.) 10 tablet 0  ? amLODipine (NORVASC) 5 MG tablet TAKE 1 TABLET BY MOUTH DAILY 90 tablet 2  ? aspirin EC 81 MG tablet Take 1 tablet (81 mg total) by mouth daily. 90 tablet 3  ? b complex vitamins tablet Take 1 tablet by mouth daily.    ?  Cholecalciferol (VITAMIN D3) 25 MCG (1000 UT) CAPS Take 2 capsules by mouth daily.    ? clopidogrel (PLAVIX) 75 MG tablet TAKE 1 TABLET BY MOUTH EVERY DAY WITH BREAKFAST 90 tablet 1  ? co-enzyme Q-10 30 MG capsule Take 30 mg by mouth daily.    ? FEROSUL 325 (65 Fe) MG tablet Take 325 mg by mouth daily.    ? hydrochlorothiazide (HYDRODIURIL) 25 MG tablet TAKE 1 TABLET BY MOUTH EVERY DAY 90 tablet 1  ? isosorbide mononitrate (IMDUR) 30 MG 24 hr tablet TAKE 1 TABLET BY MOUTH TWICE DAILY 60 tablet 6  ? losartan (COZAAR) 50 MG tablet Take 50 mg by mouth daily.    ? MAGNESIUM OXIDE PO Take 1 tablet by mouth daily.    ? metoprolol succinate (TOPROL-XL) 25 MG 24 hr  tablet Take 0.5 tablets (12.5 mg total) by mouth daily. 90 tablet 1  ? multivitamin (THERAGRAN) per tablet Take 1 tablet by mouth daily.    ? nitroGLYCERIN (NITROSTAT) 0.4 MG SL tablet Place 1 tablet (0.4 mg total) under the tongue every 5 (five) minutes x 3 doses as needed for chest pain (if no relief after 3rd dose, proceed to the ED for an evaluation). 25 tablet 3  ? Omega 3 1000 MG CAPS Take 1 capsule by mouth daily.    ? pantoprazole (PROTONIX) 40 MG tablet TAKE 1 TABLET BY MOUTH EVERY DAY 90 tablet 1  ? potassium chloride SA (KLOR-CON M) 20 MEQ tablet TAKE 1 TABLET BY MOUTH TWICE DAILY 60 tablet 6  ? Tiotropium Bromide-Olodaterol (STIOLTO RESPIMAT) 2.5-2.5 MCG/ACT AERS Inhale 1 puff into the lungs 2 (two) times a day.    ? VENTOLIN HFA 108 (90 Base) MCG/ACT inhaler Inhale 2 puffs into the lungs 2 (two) times daily as needed.    ? vitamin E 1000 UNIT capsule Take 1,000 Units by mouth daily.    ? ZETIA 10 MG tablet TAKE 1 TABLET BY MOUTH DAILY 30 tablet 6  ? Zinc 50 MG CAPS Take 1 capsule by mouth daily.    ? ?No current facility-administered medications for this visit.  ? ?Allergies:  Anoro ellipta [umeclidinium-vilanterol], Fenofibrate, Lipitor [atorvastatin calcium], Niaspan [niacin], and Zocor [simvastatin]  ? ?ROS: Hearing loss. ? ?Physical Exam: ?VS:  BP (!) 152/70   Pulse (!) 47   Ht $R'5\' 10"'wY$  (1.778 m)   Wt 211 lb (95.7 kg)   SpO2 98%   BMI 30.28 kg/m? , BMI Body mass index is 30.28 kg/m?. ? ?Wt Readings from Last 3 Encounters:  ?05/29/21 211 lb (95.7 kg)  ?11/18/20 209 lb 9.6 oz (95.1 kg)  ?05/03/20 200 lb 12.8 oz (91.1 kg)  ?  ?General: Patient appears comfortable at rest. ?HEENT: Conjunctiva and lids normal. ?Neck: Supple, no elevated JVP or carotid bruits, no thyromegaly. ?Lungs: Clear to auscultation, nonlabored breathing at rest. ?Cardiac: Irregularly irregular, no S3 or significant systolic murmur. ?Extremities: No pitting edema, distal pulses 2+. ? ?ECG:  An ECG dated 04/23/2020 was personally  reviewed today and demonstrated:  Atrial fibrillation with PVC and diffuse nonspecific ST-T changes. ? ?Recent Labwork: ? ?May 2022: AST 23, ALT 20, cholesterol 177, triglycerides 127, HDL 30, LDL 124 ?March 2023: Hemoglobin A1c 6.1%, cholesterol 151, triglycerides 84, HDL 35, LDL 100, BUN 14, creatinine 1.15, potassium 4.6, AST 21, ALT 18, hemoglobin 9.8, platelets 288 ? ?Other Studies Reviewed Today: ? ?Echocardiogram 04/20/2020: ? 1. Left ventricular ejection fraction, by estimation, is 55 to 60%. The  ?left ventricle has normal  function. The left ventricle demonstrates  ?regional wall motion abnormalities (see scoring diagram/findings for  ?description). There is severe left  ?ventricular hypertrophy. Left ventricular diastolic function could not be  ?evaluated. There is moderate hypokinesis of the left ventricular,  ?basal-mid inferior wall.  ? 2. Right ventricular systolic function is low normal. The right  ?ventricular size is normal. Tricuspid regurgitation signal is inadequate  ?for assessing PA pressure.  ? 3. Left atrial size was severely dilated.  ? 4. Right atrial size was mildly dilated.  ? 5. Moderate pleural effusion in the left lateral region.  ? 6. The mitral valve is abnormal. Trivial mitral valve regurgitation.  ? 7. The aortic valve is tricuspid. Aortic valve regurgitation is not  ?visualized. Mild aortic valve sclerosis is present, with no evidence of  ?aortic valve stenosis. Aortic valve mean gradient measures 6.0 mmHg.  ? 8. The inferior vena cava is dilated in size with <50% respiratory  ?variability, suggesting right atrial pressure of 15 mmHg.  ?  ?Cardiac catheterization 04/22/2020: ?A stent was successfully placed. ?  ?99% mid body stenosis in SVG to obtuse marginal reduced to 10% with TIMI grade III flow using a 4.0 x 22 Onyx deployed at 12 atm.  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 (The previously placed ostial to proximal saphenous vein to OM  stent overhangs into the ascending aorta preventing engagement with extra-support guide catheters.  An internal mammary guide catheter was used to coaxially aim and advanced the guidewire into the graft). ?Chronic

## 2021-05-29 NOTE — Patient Instructions (Addendum)

## 2021-06-12 DIAGNOSIS — J069 Acute upper respiratory infection, unspecified: Secondary | ICD-10-CM | POA: Diagnosis not present

## 2021-06-15 ENCOUNTER — Inpatient Hospital Stay: Admission: AD | Admit: 2021-06-15 | Payer: Medicare Other | Admitting: Internal Medicine

## 2021-06-15 DIAGNOSIS — K625 Hemorrhage of anus and rectum: Secondary | ICD-10-CM | POA: Diagnosis not present

## 2021-06-15 DIAGNOSIS — K922 Gastrointestinal hemorrhage, unspecified: Secondary | ICD-10-CM | POA: Diagnosis not present

## 2021-06-15 DIAGNOSIS — J90 Pleural effusion, not elsewhere classified: Secondary | ICD-10-CM | POA: Diagnosis not present

## 2021-06-15 DIAGNOSIS — K5730 Diverticulosis of large intestine without perforation or abscess without bleeding: Secondary | ICD-10-CM | POA: Diagnosis not present

## 2021-06-15 DIAGNOSIS — Z5329 Procedure and treatment not carried out because of patient's decision for other reasons: Secondary | ICD-10-CM | POA: Diagnosis not present

## 2021-06-15 DIAGNOSIS — Z79899 Other long term (current) drug therapy: Secondary | ICD-10-CM | POA: Diagnosis not present

## 2021-06-15 DIAGNOSIS — I10 Essential (primary) hypertension: Secondary | ICD-10-CM | POA: Diagnosis not present

## 2021-06-15 DIAGNOSIS — K5731 Diverticulosis of large intestine without perforation or abscess with bleeding: Secondary | ICD-10-CM | POA: Diagnosis not present

## 2021-06-25 ENCOUNTER — Other Ambulatory Visit: Payer: Self-pay | Admitting: Cardiology

## 2021-06-27 DIAGNOSIS — H905 Unspecified sensorineural hearing loss: Secondary | ICD-10-CM | POA: Diagnosis not present

## 2021-06-30 DIAGNOSIS — Z8719 Personal history of other diseases of the digestive system: Secondary | ICD-10-CM | POA: Diagnosis not present

## 2021-07-15 ENCOUNTER — Encounter: Payer: Self-pay | Admitting: Internal Medicine

## 2021-08-12 ENCOUNTER — Telehealth: Payer: Self-pay | Admitting: Cardiology

## 2021-08-12 NOTE — Telephone Encounter (Signed)
Patient wife called stating that Dr. Domenic Polite told her to make appt for patient ASAP, so her can put him on medication.  Patient can't wait until his September appt.

## 2021-08-12 NOTE — Telephone Encounter (Signed)
Pt c/o medication issue:  1. Name of Medication:   clopidogrel (PLAVIX) 75 MG tablet    2. How are you currently taking this medication (dosage and times per day)? TAKE 1 TABLET BY MOUTH EVERY DAY WITH BREAKFAST  3. Are you having a reaction (difficulty breathing--STAT)? No  4. What is your medication issue?  Pt states that medication is causing him to bleed. Pt states that Dr. Domenic Polite told him to call and be seen if this happens. Pt would like a call back. Please advise

## 2021-08-12 NOTE — Telephone Encounter (Addendum)
Reports rectal bleeding when taking plavix. Says he stopped plavix 3 months ago and rectal bleeding stopped.

## 2021-08-12 NOTE — Telephone Encounter (Signed)
     Do we know what type of bleeding issues he is having? Is he just experiencing easy bruising or is he having melena or hematochezia? Appears he was evaluated at [facility] ED in 05/2021 but left AMA. The patient has atrial fibrillation but previously declined anticoagulation given concerns for GI bleeding and has been on ASA and Plavix since undergoing stenting in 04/2020.  Signed, Erma Heritage, PA-C 08/12/2021, 4:04 PM

## 2021-08-12 NOTE — Telephone Encounter (Signed)
    If he already stopped this 3 months ago, I do not think there is anything different to do at this time. Would remain off Plavix for now. Will continue ASA 81 mg daily if able to tolerate. Thankfully, he is already more than 12 months out from his last stent.  Signed, Erma Heritage, PA-C 08/12/2021, 4:53 PM Pager: [phone]

## 2021-08-12 NOTE — Telephone Encounter (Signed)
Patient informed and verbalized understanding of plan. 

## 2021-08-13 NOTE — Telephone Encounter (Signed)
Wife made aware that the issue of rectal bleeding was discussed with the patient yesterday and that per Stormy Fabian, the patient is to stay off Plavix if it is causing rectal bleeding and only take one 81 mg aspirin daily until he comes in to see Dr. Domenic Polite on 9/20. Wife verbalized understanding.

## 2021-08-13 NOTE — Telephone Encounter (Signed)
[name] wife is returning call. 

## 2021-08-13 NOTE — Telephone Encounter (Signed)
Left message to call office

## 2021-08-22 DIAGNOSIS — R899 Unspecified abnormal finding in specimens from other organs, systems and tissues: Secondary | ICD-10-CM | POA: Diagnosis not present

## 2021-08-25 DIAGNOSIS — E782 Mixed hyperlipidemia: Secondary | ICD-10-CM | POA: Diagnosis not present

## 2021-08-25 DIAGNOSIS — R7301 Impaired fasting glucose: Secondary | ICD-10-CM | POA: Diagnosis not present

## 2021-09-03 ENCOUNTER — Ambulatory Visit: Payer: Medicare Other | Admitting: Gastroenterology

## 2021-09-03 DIAGNOSIS — I10 Essential (primary) hypertension: Secondary | ICD-10-CM | POA: Diagnosis not present

## 2021-09-03 DIAGNOSIS — T161XXA Foreign body in right ear, initial encounter: Secondary | ICD-10-CM | POA: Diagnosis not present

## 2021-09-04 DIAGNOSIS — R600 Localized edema: Secondary | ICD-10-CM | POA: Diagnosis not present

## 2021-09-04 DIAGNOSIS — I10 Essential (primary) hypertension: Secondary | ICD-10-CM | POA: Diagnosis not present

## 2021-09-04 DIAGNOSIS — I700 Atherosclerosis of aorta: Secondary | ICD-10-CM | POA: Diagnosis not present

## 2021-09-04 DIAGNOSIS — I482 Chronic atrial fibrillation, unspecified: Secondary | ICD-10-CM | POA: Diagnosis not present

## 2021-09-04 DIAGNOSIS — D509 Iron deficiency anemia, unspecified: Secondary | ICD-10-CM | POA: Diagnosis not present

## 2021-09-04 DIAGNOSIS — I2510 Atherosclerotic heart disease of native coronary artery without angina pectoris: Secondary | ICD-10-CM | POA: Diagnosis not present

## 2021-09-04 DIAGNOSIS — G720 Drug-induced myopathy: Secondary | ICD-10-CM | POA: Diagnosis not present

## 2021-09-04 DIAGNOSIS — Z8719 Personal history of other diseases of the digestive system: Secondary | ICD-10-CM | POA: Diagnosis not present

## 2021-09-04 DIAGNOSIS — J449 Chronic obstructive pulmonary disease, unspecified: Secondary | ICD-10-CM | POA: Diagnosis not present

## 2021-09-04 DIAGNOSIS — R7301 Impaired fasting glucose: Secondary | ICD-10-CM | POA: Diagnosis not present

## 2021-09-04 DIAGNOSIS — E782 Mixed hyperlipidemia: Secondary | ICD-10-CM | POA: Diagnosis not present

## 2021-09-23 ENCOUNTER — Other Ambulatory Visit: Payer: Self-pay | Admitting: Cardiology

## 2021-09-23 DIAGNOSIS — I119 Hypertensive heart disease without heart failure: Secondary | ICD-10-CM

## 2021-09-27 ENCOUNTER — Other Ambulatory Visit: Payer: Self-pay | Admitting: Medical

## 2021-10-22 ENCOUNTER — Other Ambulatory Visit: Payer: Self-pay | Admitting: Cardiology

## 2021-10-30 DIAGNOSIS — J449 Chronic obstructive pulmonary disease, unspecified: Secondary | ICD-10-CM | POA: Diagnosis not present

## 2021-10-30 DIAGNOSIS — E782 Mixed hyperlipidemia: Secondary | ICD-10-CM | POA: Diagnosis not present

## 2021-10-30 DIAGNOSIS — I10 Essential (primary) hypertension: Secondary | ICD-10-CM | POA: Diagnosis not present

## 2021-11-19 ENCOUNTER — Ambulatory Visit: Payer: Medicare Other | Attending: Cardiology | Admitting: Cardiology

## 2021-11-19 ENCOUNTER — Encounter: Payer: Self-pay | Admitting: Cardiology

## 2021-11-19 VITALS — BP 118/78 | HR 58 | Ht 70.0 in | Wt 191.4 lb

## 2021-11-19 DIAGNOSIS — T466X5D Adverse effect of antihyperlipidemic and antiarteriosclerotic drugs, subsequent encounter: Secondary | ICD-10-CM

## 2021-11-19 DIAGNOSIS — I25119 Atherosclerotic heart disease of native coronary artery with unspecified angina pectoris: Secondary | ICD-10-CM | POA: Diagnosis not present

## 2021-11-19 DIAGNOSIS — E782 Mixed hyperlipidemia: Secondary | ICD-10-CM | POA: Diagnosis not present

## 2021-11-19 DIAGNOSIS — M791 Myalgia, unspecified site: Secondary | ICD-10-CM | POA: Diagnosis not present

## 2021-11-19 DIAGNOSIS — T466X5A Adverse effect of antihyperlipidemic and antiarteriosclerotic drugs, initial encounter: Secondary | ICD-10-CM

## 2021-11-19 DIAGNOSIS — I4821 Permanent atrial fibrillation: Secondary | ICD-10-CM

## 2021-11-19 NOTE — Patient Instructions (Addendum)

## 2021-11-19 NOTE — Progress Notes (Signed)
Cardiology Office Note  Date: 11/19/2021   ID: William Elliott, DOB [birth year], MRN [id number]  PCP:  [name], MD  Cardiologist:  [name], MD Electrophysiologist:  None   Chief Complaint  Patient presents with   Cardiac follow-up    History of Present Illness: William Elliott is a 81 y.o. male last seen in March.  He is here for a routine visit.  He does not describe any angina or nitroglycerin use in the interim.  NYHA class II dyspnea.  He has been working on portion size and weight loss, has lost at least 20 pounds over several months.  States that he feels better.  I reviewed his medications which are noted below.  He reports compliance with therapy.  He will have follow-up lab work with PCP later in the year.  LDL back in June was 123 on Zetia.  He has known statin intolerance.  Past Medical History:  Diagnosis Date   Acute lower GI bleeding 2008   Anxiety    Asthma    Carotid artery disease (HCC)    Asymptomatic left carotid bruit   COPD (chronic obstructive pulmonary disease) (HCC)    Coronary artery disease    a. CABG - 1999 with LIMA-LAD, SVG-DIAG, SVG-OM, SVG-RPDA  b. Cath in setting of NSTEMI 03/18/2015: patent LIMA-LAD, occluded SVG-RCA, occluded SVG-D1, 99% stenosis of SVG-OM treated w/ DES   Depression    Dyslipidemia    Erectile dysfunction    Essential hypertension    History of blood transfusion 2008   History of stroke    Hyperlipidemia    Myocardial infarction [facility])     Past Surgical History:  Procedure Laterality Date   CARDIAC CATHETERIZATION     CARDIAC CATHETERIZATION N/A 03/18/2015   Procedure: Left Heart Cath and Cors/Grafts Angiography;  Surgeon: Leonie Man, MD;  Location: [place] CV LAB;  Service: Cardiovascular;  Laterality: N/A;   CARDIAC CATHETERIZATION N/A 03/18/2015   Procedure: Coronary Stent Intervention;  Surgeon: Leonie Man, MD;  Location: [place] CV LAB;  Service: Cardiovascular;  Laterality: N/A;    CATARACT EXTRACTION W/ INTRAOCULAR LENS  IMPLANT, BILATERAL Bilateral    CORONARY ANGIOPLASTY     CORONARY ARTERY BYPASS GRAFT  1999   CORONARY STENT INTERVENTION N/A 04/22/2020   Procedure: CORONARY STENT INTERVENTION;  Surgeon: Belva Crome, MD;  Location: [place] CV LAB;  Service: Cardiovascular;  Laterality: N/A;   FRACTURE SURGERY     HERNIA REPAIR     LEFT HEART CATH AND CORS/GRAFTS ANGIOGRAPHY N/A 04/22/2020   Procedure: LEFT HEART CATH AND CORS/GRAFTS ANGIOGRAPHY;  Surgeon: Belva Crome, MD;  Location: [place] CV LAB;  Service: Cardiovascular;  Laterality: N/A;   [facility]; took bone out of my right hip   TONSILLECTOMY     UMBILICAL HERNIA REPAIR  ~ 2000    Current Outpatient Medications  Medication Sig Dispense Refill   ALPRAZolam (XANAX) 1 MG tablet Take 1 tablet (1 mg total) by mouth at bedtime as needed for anxiety. (Patient taking differently: Take 1 mg by mouth 2 (two) times daily as needed for anxiety.) 10 tablet 0   amLODipine (NORVASC) 5 MG tablet TAKE 1 TABLET BY MOUTH DAILY 90 tablet 2   aspirin EC 81 MG tablet Take 1 tablet (81 mg total) by mouth daily. 90 tablet 3   b complex vitamins tablet Take 1 tablet by mouth daily.  Cholecalciferol (VITAMIN D3) 25 MCG (1000 UT) CAPS Take 2 capsules by mouth daily.     co-enzyme Q-10 30 MG capsule Take 30 mg by mouth daily.     FEROSUL 325 (65 Fe) MG tablet Take 325 mg by mouth daily.     isosorbide mononitrate (IMDUR) 30 MG 24 hr tablet TAKE 1 TABLET BY MOUTH TWICE DAILY 60 tablet 6   losartan (COZAAR) 50 MG tablet Take 50 mg by mouth daily.     MAGNESIUM OXIDE PO Take 1 tablet by mouth daily.     metoprolol succinate (TOPROL-XL) 25 MG 24 hr tablet Take 0.5 tablets (12.5 mg total) by mouth daily. 90 tablet 1   multivitamin (THERAGRAN) per tablet Take 1 tablet by mouth daily.     nitroGLYCERIN (NITROSTAT) 0.4 MG SL tablet place ONE tablet UNDER THE TONGUE EVERY FIVE MINUTES  UP TO THREE times AS NEEDED FOR CHEST pain if no RELIEF AFTER 3rd DOSE proceed TO THE ED FOR an evaluation 25 tablet 3   Omega 3 1000 MG CAPS Take 1 capsule by mouth daily.     pantoprazole (PROTONIX) 40 MG tablet TAKE 1 TABLET BY MOUTH EVERY DAY 90 tablet 1   potassium chloride SA (KLOR-CON M) 20 MEQ tablet TAKE 1 TABLET BY MOUTH TWICE DAILY 60 tablet 6   Tiotropium Bromide-Olodaterol (STIOLTO RESPIMAT) 2.5-2.5 MCG/ACT AERS Inhale 1 puff into the lungs 2 (two) times a day.     UNABLE TO FIND Beet Powder-mix with drink once a day     VENTOLIN HFA 108 (90 Base) MCG/ACT inhaler Inhale 2 puffs into the lungs 2 (two) times daily as needed.     vitamin E 1000 UNIT capsule Take 1,000 Units by mouth daily.     ZETIA 10 MG tablet TAKE 1 TABLET BY MOUTH DAILY 30 tablet 6   Zinc 50 MG CAPS Take 1 capsule by mouth daily.     hydrochlorothiazide (HYDRODIURIL) 25 MG tablet TAKE 1 TABLET BY MOUTH EVERY DAY (Patient not taking: Reported on 11/19/2021) 90 tablet 1   No current facility-administered medications for this visit.   Allergies:  Anoro ellipta [umeclidinium-vilanterol], Fenofibrate, Lipitor [atorvastatin calcium], Niaspan [niacin], and Zocor [simvastatin]   ROS:  No syncope.  Physical Exam: VS:  BP 118/78 (BP Location: Left Arm, Patient Position: Sitting, Cuff Size: Normal)   Pulse (!) 58   Ht _0  (1.778 m)   Wt 191 lb 6.4 oz (86.8 kg)   SpO2 98%   BMI 27.46 kg/m , BMI Body mass index is 27.46 kg/m.  Wt Readings from Last 3 Encounters:  11/19/21 191 lb 6.4 oz (86.8 kg)  05/29/21 211 lb (95.7 kg)  11/18/20 209 lb 9.6 oz (95.1 kg)    General: Patient appears comfortable at rest. HEENT: Conjunctiva and lids normal. Neck: Supple, no elevated JVP or carotid bruits, no thyromegaly. Lungs: Clear to auscultation, nonlabored breathing at rest. Cardiac: Irregularly irregular, no S3 or significant systolic murmur. Extremities: No pitting edema.  ECG:  An ECG dated 05/29/2021 was personally  reviewed today and demonstrated:  Atrial fibrillation with intermittent PVCs.  Recent Labwork:  June 2023: Cholesterol 180, triglycerides 97, HDL 39, LDL 123, hemoglobin A1c 5.6%, hemoglobin 11.9, platelets 241, BUN 15, creatinine 1.13, potassium 4.8, AST 29, ALT 30  Other Studies Reviewed Today:  Echocardiogram 04/20/2020:  1. Left ventricular ejection fraction, by estimation, is 55 to 60%. The  left ventricle has normal function. The left ventricle demonstrates  regional wall motion abnormalities (see  scoring diagram/findings for  description). There is severe left  ventricular hypertrophy. Left ventricular diastolic function could not be  evaluated. There is moderate hypokinesis of the left ventricular,  basal-mid inferior wall.   2. Right ventricular systolic function is low normal. The right  ventricular size is normal. Tricuspid regurgitation signal is inadequate  for assessing PA pressure.   3. Left atrial size was severely dilated.   4. Right atrial size was mildly dilated.   5. Moderate pleural effusion in the left lateral region.   6. The mitral valve is abnormal. Trivial mitral valve regurgitation.   7. The aortic valve is tricuspid. Aortic valve regurgitation is not  visualized. Mild aortic valve sclerosis is present, with no evidence of  aortic valve stenosis. Aortic valve mean gradient measures 6.0 mmHg.   8. The inferior vena cava is dilated in size with <50% respiratory  variability, suggesting right atrial pressure of 15 mmHg.    Cardiac catheterization 04/22/2020: A stent was successfully placed.   99% mid body stenosis in SVG to obtuse marginal reduced to 10% with TIMI grade III flow using a 4.0 x 22 Onyx deployed at 12 atm.  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 (The previously placed ostial to proximal saphenous vein to OM stent overhangs into the ascending aorta preventing engagement with extra-support guide catheters.  An  internal mammary guide catheter was used to coaxially aim and advanced the guidewire into the graft). Chronic occlusion of SVG to PDA. Chronic occlusion of SVG to diagonal. Patent LIMA to the mid to distal LAD.  Supplies collaterals to the PDA. Total occlusion of the mid LAD.  95% ostial LAD stenosis with only septal perforator and a small diagonal at risk. Total occlusion of the mid circumflex Total occlusion of the mid RCA.  PDA is supplied by LAD collaterals when the LIMA graft to the LAD is visualized. Echocardiogram documents preserved LV systolic function, EF greater than 50%.  LVEDP is normal, 7 mmHg.  Assessment and Plan:  1.  Multivessel CAD status post CABG with graft disease and DES to the SVG to OM in 2017.  Also status post DES to the SVG to OM and 2022 with occlusion of the SVG to PDA and SVG diagonal, patent LIMA to LAD.  We have been managing him medically with good angina control.  Continue aspirin, Norvasc, Imdur, Cozaar, Toprol-XL, and as needed nitroglycerin.  2.  Permanent atrial fibrillation with CHA2DS2-VASc score of 6.  He is asymptomatic in terms of palpitations and has consistently declined anticoagulation.  Continue current dose of Toprol-XL.  3.  Mixed hyperlipidemia with statin intolerance.  He has preferred to remain on Zetia rather than other options.  Last LDL 123.  Also working on diet and weight loss.  Medication Adjustments/Labs and Tests Ordered: Current medicines are reviewed at length with the patient today.  Concerns regarding medicines are outlined above.   Tests Ordered: No orders of the defined types were placed in this encounter.   Medication Changes: No orders of the defined types were placed in this encounter.   Disposition:  Follow up  6 months.  Signed, Satira Sark, MD, [facility] 11/19/2021 2:51 PM    [facility] [place], [place], [place] [address] Phone: [phone]; Fax: [phone]

## 2021-12-02 DIAGNOSIS — H04123 Dry eye syndrome of bilateral lacrimal glands: Secondary | ICD-10-CM | POA: Diagnosis not present

## 2021-12-02 DIAGNOSIS — H524 Presbyopia: Secondary | ICD-10-CM | POA: Diagnosis not present

## 2021-12-02 DIAGNOSIS — H26492 Other secondary cataract, left eye: Secondary | ICD-10-CM | POA: Diagnosis not present

## 2021-12-02 DIAGNOSIS — H43813 Vitreous degeneration, bilateral: Secondary | ICD-10-CM | POA: Diagnosis not present

## 2021-12-02 DIAGNOSIS — H5213 Myopia, bilateral: Secondary | ICD-10-CM | POA: Diagnosis not present

## 2021-12-12 DIAGNOSIS — Z8719 Personal history of other diseases of the digestive system: Secondary | ICD-10-CM | POA: Diagnosis not present

## 2021-12-12 DIAGNOSIS — J449 Chronic obstructive pulmonary disease, unspecified: Secondary | ICD-10-CM | POA: Diagnosis not present

## 2021-12-12 DIAGNOSIS — I10 Essential (primary) hypertension: Secondary | ICD-10-CM | POA: Diagnosis not present

## 2021-12-12 DIAGNOSIS — R7301 Impaired fasting glucose: Secondary | ICD-10-CM | POA: Diagnosis not present

## 2021-12-12 DIAGNOSIS — D509 Iron deficiency anemia, unspecified: Secondary | ICD-10-CM | POA: Diagnosis not present

## 2021-12-12 DIAGNOSIS — R197 Diarrhea, unspecified: Secondary | ICD-10-CM | POA: Diagnosis not present

## 2021-12-12 DIAGNOSIS — R600 Localized edema: Secondary | ICD-10-CM | POA: Diagnosis not present

## 2021-12-12 DIAGNOSIS — E440 Moderate protein-calorie malnutrition: Secondary | ICD-10-CM | POA: Diagnosis not present

## 2021-12-15 DIAGNOSIS — I482 Chronic atrial fibrillation, unspecified: Secondary | ICD-10-CM | POA: Diagnosis not present

## 2021-12-15 DIAGNOSIS — E782 Mixed hyperlipidemia: Secondary | ICD-10-CM | POA: Diagnosis not present

## 2021-12-15 DIAGNOSIS — D509 Iron deficiency anemia, unspecified: Secondary | ICD-10-CM | POA: Diagnosis not present

## 2021-12-15 DIAGNOSIS — G720 Drug-induced myopathy: Secondary | ICD-10-CM | POA: Diagnosis not present

## 2021-12-15 DIAGNOSIS — I2510 Atherosclerotic heart disease of native coronary artery without angina pectoris: Secondary | ICD-10-CM | POA: Diagnosis not present

## 2021-12-15 DIAGNOSIS — I700 Atherosclerosis of aorta: Secondary | ICD-10-CM | POA: Diagnosis not present

## 2021-12-15 DIAGNOSIS — R7301 Impaired fasting glucose: Secondary | ICD-10-CM | POA: Diagnosis not present

## 2021-12-15 DIAGNOSIS — Z0000 Encounter for general adult medical examination without abnormal findings: Secondary | ICD-10-CM | POA: Diagnosis not present

## 2021-12-15 DIAGNOSIS — R600 Localized edema: Secondary | ICD-10-CM | POA: Diagnosis not present

## 2021-12-15 DIAGNOSIS — I10 Essential (primary) hypertension: Secondary | ICD-10-CM | POA: Diagnosis not present

## 2021-12-15 DIAGNOSIS — J449 Chronic obstructive pulmonary disease, unspecified: Secondary | ICD-10-CM | POA: Diagnosis not present

## 2021-12-16 ENCOUNTER — Encounter: Payer: Self-pay | Admitting: Internal Medicine

## 2021-12-22 ENCOUNTER — Other Ambulatory Visit: Payer: Self-pay | Admitting: Cardiology

## 2022-02-26 ENCOUNTER — Emergency Department (HOSPITAL_COMMUNITY)
Admission: EM | Admit: 2022-02-26 | Discharge: 2022-02-26 | Discharge status: Against Medical Advice | Payer: Medicare Other

## 2022-02-26 DIAGNOSIS — Z7982 Long term (current) use of aspirin: Secondary | ICD-10-CM | POA: Diagnosis not present

## 2022-02-26 DIAGNOSIS — K922 Gastrointestinal hemorrhage, unspecified: Secondary | ICD-10-CM | POA: Diagnosis not present

## 2022-02-26 DIAGNOSIS — Z79899 Other long term (current) drug therapy: Secondary | ICD-10-CM | POA: Diagnosis not present

## 2022-02-26 DIAGNOSIS — I10 Essential (primary) hypertension: Secondary | ICD-10-CM | POA: Diagnosis not present

## 2022-02-26 DIAGNOSIS — R197 Diarrhea, unspecified: Secondary | ICD-10-CM | POA: Diagnosis not present

## 2022-02-26 DIAGNOSIS — Z8673 Personal history of transient ischemic attack (TIA), and cerebral infarction without residual deficits: Secondary | ICD-10-CM | POA: Diagnosis not present

## 2022-03-20 DIAGNOSIS — N289 Disorder of kidney and ureter, unspecified: Secondary | ICD-10-CM | POA: Diagnosis not present

## 2022-03-20 DIAGNOSIS — R7301 Impaired fasting glucose: Secondary | ICD-10-CM | POA: Diagnosis not present

## 2022-03-20 DIAGNOSIS — D509 Iron deficiency anemia, unspecified: Secondary | ICD-10-CM | POA: Diagnosis not present

## 2022-03-20 DIAGNOSIS — I10 Essential (primary) hypertension: Secondary | ICD-10-CM | POA: Diagnosis not present

## 2022-03-22 ENCOUNTER — Other Ambulatory Visit: Payer: Self-pay | Admitting: Cardiology

## 2022-03-22 ENCOUNTER — Other Ambulatory Visit: Payer: Self-pay | Admitting: Medical

## 2022-03-22 DIAGNOSIS — I119 Hypertensive heart disease without heart failure: Secondary | ICD-10-CM

## 2022-03-26 DIAGNOSIS — I482 Chronic atrial fibrillation, unspecified: Secondary | ICD-10-CM | POA: Diagnosis not present

## 2022-03-26 DIAGNOSIS — R600 Localized edema: Secondary | ICD-10-CM | POA: Diagnosis not present

## 2022-03-26 DIAGNOSIS — I2510 Atherosclerotic heart disease of native coronary artery without angina pectoris: Secondary | ICD-10-CM | POA: Diagnosis not present

## 2022-03-26 DIAGNOSIS — R7301 Impaired fasting glucose: Secondary | ICD-10-CM | POA: Diagnosis not present

## 2022-03-26 DIAGNOSIS — I10 Essential (primary) hypertension: Secondary | ICD-10-CM | POA: Diagnosis not present

## 2022-03-26 DIAGNOSIS — J449 Chronic obstructive pulmonary disease, unspecified: Secondary | ICD-10-CM | POA: Diagnosis not present

## 2022-03-26 DIAGNOSIS — E782 Mixed hyperlipidemia: Secondary | ICD-10-CM | POA: Diagnosis not present

## 2022-03-26 DIAGNOSIS — G720 Drug-induced myopathy: Secondary | ICD-10-CM | POA: Diagnosis not present

## 2022-03-26 DIAGNOSIS — N289 Disorder of kidney and ureter, unspecified: Secondary | ICD-10-CM | POA: Diagnosis not present

## 2022-03-26 DIAGNOSIS — Z0000 Encounter for general adult medical examination without abnormal findings: Secondary | ICD-10-CM | POA: Diagnosis not present

## 2022-03-26 DIAGNOSIS — I700 Atherosclerosis of aorta: Secondary | ICD-10-CM | POA: Diagnosis not present

## 2022-03-26 DIAGNOSIS — D509 Iron deficiency anemia, unspecified: Secondary | ICD-10-CM | POA: Diagnosis not present

## 2022-03-27 ENCOUNTER — Encounter: Payer: Self-pay | Admitting: Internal Medicine

## 2022-05-13 DIAGNOSIS — H905 Unspecified sensorineural hearing loss: Secondary | ICD-10-CM | POA: Diagnosis not present

## 2022-06-15 ENCOUNTER — Other Ambulatory Visit: Payer: Self-pay | Admitting: Cardiology

## 2022-06-30 ENCOUNTER — Other Ambulatory Visit: Payer: Self-pay | Admitting: Cardiology

## 2022-07-02 DIAGNOSIS — R7301 Impaired fasting glucose: Secondary | ICD-10-CM | POA: Diagnosis not present

## 2022-07-02 DIAGNOSIS — N289 Disorder of kidney and ureter, unspecified: Secondary | ICD-10-CM | POA: Diagnosis not present

## 2022-07-02 DIAGNOSIS — D509 Iron deficiency anemia, unspecified: Secondary | ICD-10-CM | POA: Diagnosis not present

## 2022-07-02 DIAGNOSIS — I10 Essential (primary) hypertension: Secondary | ICD-10-CM | POA: Diagnosis not present

## 2022-07-09 DIAGNOSIS — Z2839 Other underimmunization status: Secondary | ICD-10-CM | POA: Diagnosis not present

## 2022-07-09 DIAGNOSIS — N289 Disorder of kidney and ureter, unspecified: Secondary | ICD-10-CM | POA: Diagnosis not present

## 2022-07-09 DIAGNOSIS — R600 Localized edema: Secondary | ICD-10-CM | POA: Diagnosis not present

## 2022-07-09 DIAGNOSIS — D509 Iron deficiency anemia, unspecified: Secondary | ICD-10-CM | POA: Diagnosis not present

## 2022-07-09 DIAGNOSIS — J449 Chronic obstructive pulmonary disease, unspecified: Secondary | ICD-10-CM | POA: Diagnosis not present

## 2022-07-09 DIAGNOSIS — I482 Chronic atrial fibrillation, unspecified: Secondary | ICD-10-CM | POA: Diagnosis not present

## 2022-07-09 DIAGNOSIS — R7301 Impaired fasting glucose: Secondary | ICD-10-CM | POA: Diagnosis not present

## 2022-07-09 DIAGNOSIS — G720 Drug-induced myopathy: Secondary | ICD-10-CM | POA: Diagnosis not present

## 2022-07-09 DIAGNOSIS — I700 Atherosclerosis of aorta: Secondary | ICD-10-CM | POA: Diagnosis not present

## 2022-07-09 DIAGNOSIS — E782 Mixed hyperlipidemia: Secondary | ICD-10-CM | POA: Diagnosis not present

## 2022-07-09 DIAGNOSIS — I10 Essential (primary) hypertension: Secondary | ICD-10-CM | POA: Diagnosis not present

## 2022-07-09 DIAGNOSIS — I2510 Atherosclerotic heart disease of native coronary artery without angina pectoris: Secondary | ICD-10-CM | POA: Diagnosis not present

## 2022-07-20 ENCOUNTER — Ambulatory Visit: Payer: Medicare Other | Admitting: Cardiology

## 2022-08-14 ENCOUNTER — Ambulatory Visit: Payer: 59 | Attending: Cardiology | Admitting: Nurse Practitioner

## 2022-08-14 VITALS — BP 142/64 | HR 45 | Ht 70.0 in | Wt 177.2 lb

## 2022-08-14 DIAGNOSIS — I2510 Atherosclerotic heart disease of native coronary artery without angina pectoris: Secondary | ICD-10-CM

## 2022-08-14 DIAGNOSIS — R001 Bradycardia, unspecified: Secondary | ICD-10-CM | POA: Diagnosis not present

## 2022-08-14 DIAGNOSIS — I4821 Permanent atrial fibrillation: Secondary | ICD-10-CM

## 2022-08-14 DIAGNOSIS — E782 Mixed hyperlipidemia: Secondary | ICD-10-CM

## 2022-08-14 DIAGNOSIS — Z789 Other specified health status: Secondary | ICD-10-CM | POA: Diagnosis not present

## 2022-08-14 NOTE — Progress Notes (Unsigned)
Office Visit    Patient Name: William Elliott Date of Encounter: 08/14/2022  PCP:  Benita Stabile, MD   [facility]  Cardiologist:  Nona Dell, MD *** Advanced Practice Provider:  No care team member to display Electrophysiologist:  None  {Press F2 to show EP APP, CHF, sleep or structural heart MD               :[id number]}  { Click here to update then REFRESH NOTE - MD (PCP) or APP (Team Member)  Change PCP Type for MD, Specialty for APP is either Cardiology or Clinical Cardiac Electrophysiology  :[id number]}  Chief Complaint    William Elliott is a 82 y.o. male with a hx of CAD, s/p CABG and drug-eluting stent to SVG to OM in 2017, permanent A-fib, mixed hyperlipidemia, history of lower GI bleed, HTN, dyslipidemia, and past history of stroke, who presents today for 33-month follow-up.  Past Medical History    Past Medical History:  Diagnosis Date   Acute lower GI bleeding 2008   Anxiety    Asthma    Carotid artery disease (HCC)    Asymptomatic left carotid bruit   COPD (chronic obstructive pulmonary disease) (HCC)    Coronary artery disease    a. CABG - 1999 with LIMA-LAD, SVG-DIAG, SVG-OM, SVG-RPDA  b. Cath in setting of NSTEMI 03/18/2015: patent LIMA-LAD, occluded SVG-RCA, occluded SVG-D1, 99% stenosis of SVG-OM treated w/ DES   Depression    Dyslipidemia    Erectile dysfunction    Essential hypertension    History of blood transfusion 2008   History of stroke    Hyperlipidemia    Myocardial infarction [facility])    Past Surgical History:  Procedure Laterality Date   CARDIAC CATHETERIZATION     CARDIAC CATHETERIZATION N/A 03/18/2015   Procedure: Left Heart Cath and Cors/Grafts Angiography;  Surgeon: Marykay Lex, MD;  Location: [facility] INVASIVE CV LAB;  Service: Cardiovascular;  Laterality: N/A;   CARDIAC CATHETERIZATION N/A 03/18/2015   Procedure: Coronary Stent Intervention;  Surgeon: Marykay Lex, MD;  Location: [facility] INVASIVE CV LAB;  Service:  Cardiovascular;  Laterality: N/A;   CATARACT EXTRACTION W/ INTRAOCULAR LENS  IMPLANT, BILATERAL Bilateral    CORONARY ANGIOPLASTY     CORONARY ARTERY BYPASS GRAFT  1999   CORONARY STENT INTERVENTION N/A 04/22/2020   Procedure: CORONARY STENT INTERVENTION;  Surgeon: Lyn Records, MD;  Location: [facility] INVASIVE CV LAB;  Service: Cardiovascular;  Laterality: N/A;   FRACTURE SURGERY     HERNIA REPAIR     LEFT HEART CATH AND CORS/GRAFTS ANGIOGRAPHY N/A 04/22/2020   Procedure: LEFT HEART CATH AND CORS/GRAFTS ANGIOGRAPHY;  Surgeon: Lyn Records, MD;  Location: [facility] INVASIVE CV LAB;  Service: Cardiovascular;  Laterality: N/A;   TIBIA FRACTURE SURGERY Left [facility]; took bone out of my right hip   TONSILLECTOMY     UMBILICAL HERNIA REPAIR  ~ 2000    Allergies  Allergies  Allergen Reactions   Anoro Ellipta [Umeclidinium-Vilanterol] Other (See Comments)    Cardiac problems   Fenofibrate Other (See Comments)    No energy   Lipitor [Atorvastatin Calcium] Other (See Comments)    Not known    Niaspan [Niacin] Other (See Comments)    Not known   Zocor [Simvastatin] Other (See Comments)    Not known    History of Present Illness    William Elliott is a 82 y.o. male with a  MH as mentioned above.  History of CABG and drug-eluting stent to SVG to OM 2017, also received drug-eluting stent to SVG to OM in 2022 with occlusion of SVG to PDA and SVG to diagonal, LIMA to LAD was patent.    Last seen by Dr. Diona Browner on November 19, 2021.  He was doing well at the time, was working on weight loss.  Today he presents for 64-month follow-up.  He states EKGs/Labs/Other Studies Reviewed:   The following studies were reviewed today: ***  EKG:  EKG is *** ordered today.  The ekg ordered today demonstrates ***  Recent Labs: No results found for requested labs within last 365 days.  Recent Lipid Panel    Component Value Date/Time   CHOL 176 12/09/2015 0616   TRIG 157 (H) 12/09/2015  0616   HDL 30 (L) 12/09/2015 0616   CHOLHDL 5.9 12/09/2015 0616   VLDL 31 12/09/2015 0616   LDLCALC 115 (H) 12/09/2015 0616   LDLDIRECT 117.0 04/20/2014 1303    Risk Assessment/Calculations:  {Does this patient have ATRIAL FIBRILLATION?:[phone]}  Home Medications   Current Meds  Medication Sig   ALPRAZolam (XANAX) 1 MG tablet Take 1 tablet (1 mg total) by mouth at bedtime as needed for anxiety. (Patient taking differently: Take 1 mg by mouth 2 (two) times daily as needed for anxiety.)   amLODipine (NORVASC) 5 MG tablet TAKE 1 TABLET BY MOUTH DAILY   aspirin EC 81 MG tablet Take 1 tablet (81 mg total) by mouth daily.   b complex vitamins tablet Take 1 tablet by mouth daily.   Cholecalciferol (VITAMIN D3) 25 MCG (1000 UT) CAPS Take 2 capsules by mouth daily.   FEROSUL 325 (65 Fe) MG tablet Take 325 mg by mouth daily.   furosemide (LASIX) 20 MG tablet Take 20 mg by mouth daily.   hydrochlorothiazide (HYDRODIURIL) 25 MG tablet TAKE 1 TABLET BY MOUTH EVERY DAY   isosorbide mononitrate (IMDUR) 30 MG 24 hr tablet TAKE 1 TABLET BY MOUTH TWICE DAILY   losartan (COZAAR) 50 MG tablet Take 50 mg by mouth daily.   MAGNESIUM OXIDE PO Take 1 tablet by mouth daily.   metoprolol succinate (TOPROL-XL) 25 MG 24 hr tablet TAKE 1/2 TABLET BY MOUTH EVERY DAY   multivitamin (THERAGRAN) per tablet Take 1 tablet by mouth daily.   NEXLIZET 180-10 MG TABS Take 1 tablet by mouth daily.   nitroGLYCERIN (NITROSTAT) 0.4 MG SL tablet place ONE tablet UNDER THE TONGUE EVERY FIVE MINUTES UP TO THREE times AS NEEDED FOR CHEST pain if no RELIEF AFTER 3rd DOSE proceed TO THE ED FOR an evaluation   Omega 3 1000 MG CAPS Take 1 capsule by mouth daily.   pantoprazole (PROTONIX) 40 MG tablet TAKE 1 TABLET BY MOUTH EVERY DAY   potassium chloride SA (KLOR-CON M) 20 MEQ tablet TAKE 1 TABLET BY MOUTH TWICE DAILY   Tiotropium Bromide-Olodaterol (STIOLTO RESPIMAT) 2.5-2.5 MCG/ACT AERS Inhale 1 puff into the lungs 2 (two) times  a day.   VENTOLIN HFA 108 (90 Base) MCG/ACT inhaler Inhale 2 puffs into the lungs 2 (two) times daily as needed.   vitamin E 1000 UNIT capsule Take 1,000 Units by mouth daily.   Zinc 50 MG CAPS Take 1 capsule by mouth daily.     Review of Systems   ***   All other systems reviewed and are otherwise negative except as noted above.  Physical Exam    VS:  BP (!) 142/64   Pulse Marland Kitchen)  45   Ht 5\' 10"  (1.778 m)   Wt 177 lb 3.2 oz (80.4 kg)   SpO2 99%   BMI 25.43 kg/m  , BMI Body mass index is 25.43 kg/m.  Wt Readings from Last 3 Encounters:  08/14/22 177 lb 3.2 oz (80.4 kg)  11/19/21 191 lb 6.4 oz (86.8 kg)  05/29/21 211 lb (95.7 kg)     GEN: Well nourished, well developed, in no acute distress. HEENT: normal. Neck: Supple, no JVD, carotid bruits, or masses. Cardiac: ***RRR, no murmurs, rubs, or gallops. No clubbing, cyanosis, edema.  ***Radials/PT 2+ and equal bilaterally.  Respiratory:  ***Respirations regular and unlabored, clear to auscultation bilaterally. GI: Soft, nontender, nondistended. MS: No deformity or atrophy. Skin: Warm and dry, no rash. Neuro:  Strength and sensation are intact. Psych: Normal affect.  Assessment & Plan    ***  {Are you ordering a CV Procedure (e.g. stress test, cath, DCCV, TEE, etc)?   Press F2        :[id number]}      Disposition: Follow up {follow up:15908} with Nona Dell, MD or APP.  Signed, Sharlene Dory, NP 08/14/2022, 2:23 PM [facility]

## 2022-08-14 NOTE — Patient Instructions (Addendum)
Medication Instructions:  Your physician has recommended you make the following change in your medication:  Stop taking metoprolol  Continue all other medications as prescribed  Labwork: none  Testing/Procedures: none  Follow-Up: Your physician recommends that you schedule a follow-up appointment in: 4-6 weeks   Any Other Special Instructions Will Be Listed Below (If Applicable).  If you need a refill on your cardiac medications before your next appointment, please call your pharmacy.

## 2022-09-10 ENCOUNTER — Other Ambulatory Visit: Payer: Self-pay | Admitting: Cardiology

## 2022-09-15 ENCOUNTER — Ambulatory Visit: Payer: 59 | Admitting: Nurse Practitioner

## 2022-09-26 ENCOUNTER — Other Ambulatory Visit: Payer: Self-pay | Admitting: Cardiology

## 2022-10-09 ENCOUNTER — Ambulatory Visit: Payer: 59 | Admitting: Nurse Practitioner

## 2022-10-09 ENCOUNTER — Encounter: Payer: Self-pay | Admitting: Nurse Practitioner

## 2022-10-09 ENCOUNTER — Telehealth: Payer: Self-pay | Admitting: Nurse Practitioner

## 2022-10-09 ENCOUNTER — Ambulatory Visit (INDEPENDENT_AMBULATORY_CARE_PROVIDER_SITE_OTHER): Payer: 59

## 2022-10-09 VITALS — BP 134/72 | HR 49 | Ht 70.5 in | Wt 185.6 lb

## 2022-10-09 DIAGNOSIS — R001 Bradycardia, unspecified: Secondary | ICD-10-CM | POA: Diagnosis not present

## 2022-10-09 DIAGNOSIS — R42 Dizziness and giddiness: Secondary | ICD-10-CM

## 2022-10-09 DIAGNOSIS — Z789 Other specified health status: Secondary | ICD-10-CM | POA: Diagnosis not present

## 2022-10-09 DIAGNOSIS — I4821 Permanent atrial fibrillation: Secondary | ICD-10-CM | POA: Diagnosis not present

## 2022-10-09 DIAGNOSIS — I2510 Atherosclerotic heart disease of native coronary artery without angina pectoris: Secondary | ICD-10-CM

## 2022-10-09 DIAGNOSIS — E782 Mixed hyperlipidemia: Secondary | ICD-10-CM

## 2022-10-09 DIAGNOSIS — R0989 Other specified symptoms and signs involving the circulatory and respiratory systems: Secondary | ICD-10-CM | POA: Diagnosis not present

## 2022-10-09 MED ORDER — AMLODIPINE BESYLATE 2.5 MG PO TABS: 2.5000 mg | ORAL_TABLET | Freq: Every day | ORAL | 3 refills | Status: DC

## 2022-10-09 NOTE — Telephone Encounter (Signed)
Checking percert on the following   Zio- 7 day- Bradycardia-EP  Carotid US

## 2022-10-09 NOTE — Patient Instructions (Signed)
Medication Instructions:  Your physician has recommended you make the following change in your medication:   -decrease Amlodipine 2.5 mg tablet once daily.   *If you need a refill on your cardiac medications before your next appointment, please call your pharmacy*   Lab Work: None If you have labs (blood work) drawn today and your tests are completely normal, you will receive your results only by: MyChart Message (if you have MyChart) OR A paper copy in the mail If you have any lab test that is abnormal or we need to change your treatment, we will call you to review the results.   Testing/Procedures: Your physician has requested that you have a carotid duplex. This test is an ultrasound of the carotid arteries in your neck. It looks at blood flow through these arteries that supply the brain with blood. Allow one hour for this exam. There are no restrictions or special instructions.    Follow-Up: At [facility], you and your health needs are our priority.  As part of our continuing mission to provide you with exceptional heart care, we have created designated Provider Care Teams.  These Care Teams include your primary Cardiologist (physician) and Advanced Practice Providers (APPs -  Physician Assistants and Nurse Practitioners) who all work together to provide you with the care you need, when you need it.  We recommend signing up for the patient portal called "MyChart".  Sign up information is provided on this After Visit Summary.  MyChart is used to connect with patients for Virtual Visits (Telemedicine).  Patients are able to view lab/test results, encounter notes, upcoming appointments, etc.  Non-urgent messages can be sent to your provider as well.   To learn more about what you can do with MyChart, go to [email or web address].    Your next appointment:   3 month(s)  Provider:   Sharlene Dory, NP    Other Instructions  Christena Deem- Long Term Monitor Instructions   Your  physician has requested you wear your ZIO patch monitor___7____days.   This is a single patch monitor.  [employer] supplies one patch monitor per enrollment.  Additional stickers are not available.   Please do not apply patch if you will be having a Nuclear Stress Test, Echocardiogram, Cardiac CT, MRI, or Chest Xray during the time frame you would be wearing the monitor. The patch cannot be worn during these tests.  You cannot remove and re-apply the ZIO XT patch monitor.   Your ZIO patch monitor will be sent USPS Priority mail from [facility] directly to your home address. The monitor may also be mailed to a PO BOX if home delivery is not available.   It may take 3-5 days to receive your monitor after you have been enrolled.   Once you have received you monitor, please review enclosed instructions.  Your monitor has already been registered assigning a specific monitor serial # to you.   Applying the monitor   Shave hair from upper left chest.   Hold abrader disc by orange tab.  Rub abrader in 40 strokes over left upper chest as indicated in your monitor instructions.   Clean area with 4 enclosed alcohol pads .  Use all pads to assure are is cleaned thoroughly.  Let dry.   Apply patch as indicated in monitor instructions.  Patch will be place under collarbone on left side of chest with arrow pointing upward.   Rub patch adhesive wings for 2 minutes.Remove white label marked "1".  Remove  white label marked "2".  Rub patch adhesive wings for 2 additional minutes.   While looking in a mirror, press and release button in center of patch.  A small green light will flash 3-4 times .  This will be your only indicator the monitor has been turned on.     Do not shower for the first 24 hours.  You may shower after the first 24 hours.   Press button if you feel a symptom. You will hear a small click.  Record Date, Time and Symptom in the Patient Log Book.   When you are ready to remove patch,  follow instructions on last 2 pages of Patient Log Book.  Stick patch monitor onto last page of Patient Log Book.   Place Patient Log Book in [place] box.  Use locking tab on box and tape box closed securely.  The [employer] and [employer] has [employer] on it.  Please place in mailbox as soon as possible.  Your physician should have your test results approximately 7 days after the monitor has been mailed back to [facility].   Call [facility] Customer Care at [phone] if you have questions regarding your ZIO XT patch monitor.  Call them immediately if you see an orange light blinking on your monitor.   If your monitor falls off in less than 4 days contact our Monitor department at [phone].  If your monitor becomes loose or falls off after 4 days call [employer] at [phone] for suggestions on securing your monitor.

## 2022-10-09 NOTE — Progress Notes (Signed)
Office Visit    Patient Name: William Elliott Date of Encounter: 10/09/2022 PCP:  Benita Stabile, MD [facility]  Cardiologist:  Nona Dell, MD  Advanced Practice Provider:  No care team member to display Electrophysiologist:  None   Chief Complaint    William Elliott is a 82 y.o. male with a hx of CAD, s/p CABG and drug-eluting stent to SVG to OM in 2017, permanent A-fib, mixed hyperlipidemia, history of lower GI bleed, bradycardia, HTN, dyslipidemia, and past history of stroke, who presents today for follow-up.  History of CABG and drug-eluting stent to SVG to OM 2017, also received drug-eluting stent to SVG to OM in 2022 with occlusion of SVG to PDA and SVG to diagonal, LIMA to LAD was patent.    Last seen by Dr. Diona Browner on November 19, 2021.  He was doing well at the time, was working on weight loss.  Today he presents for follow-up. At last visit, HR was found to be 45, asymptomatic. Metoprolol was stopped. Presents today for follow-up. Doing well, does admit some dizziness after taking his morning medications, goes away later in the day. BP is stable per his report. Denies any acute cardiac complaints or issues. Denies any chest pain, shortness of breath, palpitations, syncope, presyncope, orthopnea, PND, swelling or significant weight changes, acute bleeding, or claudication.  SH: Wife is Heitor Gwynn, another patient of mine, have been married for close to 60 years.   EKGs/Labs/Other Studies Reviewed:   The following studies were reviewed today:  EKG:  EKG is not ordered today.    LHC 04/2020:  A stent was successfully placed.   99% mid body stenosis in SVG to obtuse marginal reduced to 10% with TIMI grade III flow using a 4.0 x 22 Onyx deployed at 12 atm.  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 (The previously placed ostial to proximal saphenous vein to OM stent overhangs into the ascending aorta preventing  engagement with extra-support guide catheters.  An internal mammary guide catheter was used to coaxially aim and advanced the guidewire into the graft). Chronic occlusion of SVG to PDA. Chronic occlusion of SVG to diagonal. Patent LIMA to the mid to distal LAD.  Supplies collaterals to the PDA. Total occlusion of the mid LAD.  95% ostial LAD stenosis with only septal perforator and a small diagonal at risk. Total occlusion of the mid circumflex Total occlusion of the mid RCA.  PDA is supplied by LAD collaterals when the LIMA graft to the LAD is visualized. Echocardiogram documents preserved LV systolic function, EF greater than 50%.  LVEDP is normal, 7 mmHg.   RECOMMENDATIONS:   Aspirin and Plavix for 12 months. If decision to use anticoagulation for management of atrial fibrillation is made, would recommend discontinuing aspirin after 1 month and continuing Plavix to complete 12 months  Echo 04/2020:  1. Left ventricular ejection fraction, by estimation, is 55 to 60%. The  left ventricle has normal function. The left ventricle demonstrates  regional wall motion abnormalities (see scoring diagram/findings for  description). There is severe left  ventricular hypertrophy. Left ventricular diastolic function could not be  evaluated. There is moderate hypokinesis of the left ventricular,  basal-mid inferior wall.   2. Right ventricular systolic function is low normal. The right  ventricular size is normal. Tricuspid regurgitation signal is inadequate  for assessing PA pressure.   3. Left atrial size was severely dilated.  4. Right atrial size was mildly dilated.   5. Moderate pleural effusion in the left lateral region.   6. The mitral valve is abnormal. Trivial mitral valve regurgitation.   7. The aortic valve is tricuspid. Aortic valve regurgitation is not  visualized. Mild aortic valve sclerosis is present, with no evidence of  aortic valve stenosis. Aortic valve mean gradient measures  6.0 mmHg.   8. The inferior vena cava is dilated in size with <50% respiratory  variability, suggesting right atrial pressure of 15 mmHg.   Comparison(s): Changes from prior study are noted. 12/09/15: LVEF 65-70%,  normal wall motion.  Risk Assessment/Calculations:   CHA2DS2-VASc Score = 6  This indicates a 9.7% annual risk of stroke. The patient's score is based upon: CHF History: 0 HTN History: 1 Diabetes History: 0 Stroke History: 2 Vascular Disease History: 1 Age Score: 2 Gender Score: 0  Review of Systems    All other systems reviewed and are otherwise negative except as noted above.  Physical Exam    VS:  BP 134/72 (BP Location: Left Arm, Patient Position: Sitting, Cuff Size: Normal)   Pulse (!) 49   Ht 5' 10.5" (1.791 m)   Wt 185 lb 9.6 oz (84.2 kg)   SpO2 98%   BMI 26.25 kg/m  , BMI Body mass index is 26.25 kg/m.  Wt Readings from Last 3 Encounters:  10/09/22 185 lb 9.6 oz (84.2 kg)  08/14/22 177 lb 3.2 oz (80.4 kg)  11/19/21 191 lb 6.4 oz (86.8 kg)     GEN: Well nourished, well developed, in no acute distress. HEENT: normal. Neck: Supple, no JVD, L carotid bruit and no R carotid bruit, no masses. Cardiac: S1/S2, irregular rhythm and slow rate, no murmurs, rubs, or gallops. No clubbing, cyanosis, edema.  Radials/PT 2+ and equal bilaterally.  Respiratory:  Respirations regular and unlabored, clear to auscultation bilaterally. MS: No deformity or atrophy. Skin: Warm and dry, no rash. Neuro:  Strength and sensation are intact. Psych: Normal affect.  Assessment & Plan    CAD, s/p CABG Hx graft dx and DES to SVG-OM in 2017, s/p DES to SVG-OM in 2022, occlusion of PDA and SVG diagonal, patent LIMA-LAD. Stable with no anginal symptoms. No indication for ischemic evaluation. Continue Aspirin, Imdur, Losartan, and NTG PRN. Heart healthy diet and regular cardiovascular exercise encouraged.   2. Permanent A-fib, bradycardia Denies any tachycardia or palpitations.  HR today 49 bpm. Will arrange 7 day Zio XT monitor to evaluate for any pauses for arrhythmias. No longer on BB. Heart healthy diet and regular cardiovascular exercise encouraged. Has consistently declined AC in the past.   3. Mixed HLD, hx of statin intolerance, left carotid bruit Past labs from PCP's office showed improved LDL. Continue current medication regimen. Heart healthy diet and regular cardiovascular exercise encouraged. Continue to follow with PCP. Left carotid bruit noted on exam, will arrange carotid doppler.   4. Dizziness Most likely d/t his medications contributing to this. Will change Lasix to 20 mg daily PRN for weight gain, shortness of breath, or leg edema, and decrease amlodipine to 2.5 mg daily and advised him to take this at night time. No other medication changes at this time. Discussed conservative measures and ED precautions discussed.   Disposition: Follow up in 3 months with Nona Dell, MD or APP.  Signed, Sharlene Dory, NP 10/10/2022, 8:12 PM [facility]

## 2022-10-10 DIAGNOSIS — R001 Bradycardia, unspecified: Secondary | ICD-10-CM

## 2022-10-21 ENCOUNTER — Telehealth: Payer: Self-pay | Admitting: Cardiology

## 2022-10-21 ENCOUNTER — Telehealth: Payer: Self-pay | Admitting: Nurse Practitioner

## 2022-10-21 DIAGNOSIS — I4821 Permanent atrial fibrillation: Secondary | ICD-10-CM

## 2022-10-21 DIAGNOSIS — R001 Bradycardia, unspecified: Secondary | ICD-10-CM | POA: Diagnosis not present

## 2022-10-21 NOTE — Telephone Encounter (Signed)
Spoke with representative advised her that we received results this morning and E Peck reviewed and we have referred patient to electrophysiology

## 2022-10-21 NOTE — Telephone Encounter (Signed)
Zio results viewed and EP recommends referral to [facility] referral was sent in

## 2022-10-21 NOTE — Telephone Encounter (Signed)
Calling with abnormal Cardiac results. Call transferred

## 2022-10-22 ENCOUNTER — Telehealth: Payer: Self-pay

## 2022-10-22 NOTE — Telephone Encounter (Signed)
Left detailed message with further results and as previous CMA left. Sent copy to PCP and left number to call if he has any questions.

## 2022-10-22 NOTE — Telephone Encounter (Signed)
-----   Message from Sharlene Dory sent at 10/21/2022  4:52 PM EDT ----- I have also reviewed images on print out given to me by CMA, Sharen Hones.  Patient was predominantly in atrial fibrillation with slow ventricular rate.  It appears he has symptomatic bradycardia as he reported lightheadedness at 1 time while wearing monitor.  Also frequent PVCs are noted, no pauses are revealed.  I recommend EP referral for first available appt based on this report for evaluation.   Thanks!   Sharlene Dory, AGNP-C

## 2022-10-23 DIAGNOSIS — I10 Essential (primary) hypertension: Secondary | ICD-10-CM | POA: Diagnosis not present

## 2022-10-23 DIAGNOSIS — D509 Iron deficiency anemia, unspecified: Secondary | ICD-10-CM | POA: Diagnosis not present

## 2022-10-23 DIAGNOSIS — N289 Disorder of kidney and ureter, unspecified: Secondary | ICD-10-CM | POA: Diagnosis not present

## 2022-10-23 DIAGNOSIS — R7301 Impaired fasting glucose: Secondary | ICD-10-CM | POA: Diagnosis not present

## 2022-10-29 DIAGNOSIS — R600 Localized edema: Secondary | ICD-10-CM | POA: Diagnosis not present

## 2022-10-29 DIAGNOSIS — D509 Iron deficiency anemia, unspecified: Secondary | ICD-10-CM | POA: Diagnosis not present

## 2022-10-29 DIAGNOSIS — I2510 Atherosclerotic heart disease of native coronary artery without angina pectoris: Secondary | ICD-10-CM | POA: Diagnosis not present

## 2022-10-29 DIAGNOSIS — N289 Disorder of kidney and ureter, unspecified: Secondary | ICD-10-CM | POA: Diagnosis not present

## 2022-10-29 DIAGNOSIS — I482 Chronic atrial fibrillation, unspecified: Secondary | ICD-10-CM | POA: Diagnosis not present

## 2022-10-29 DIAGNOSIS — E782 Mixed hyperlipidemia: Secondary | ICD-10-CM | POA: Diagnosis not present

## 2022-10-29 DIAGNOSIS — J449 Chronic obstructive pulmonary disease, unspecified: Secondary | ICD-10-CM | POA: Diagnosis not present

## 2022-10-29 DIAGNOSIS — R7301 Impaired fasting glucose: Secondary | ICD-10-CM | POA: Diagnosis not present

## 2022-10-29 DIAGNOSIS — G720 Drug-induced myopathy: Secondary | ICD-10-CM | POA: Diagnosis not present

## 2022-10-29 DIAGNOSIS — I700 Atherosclerosis of aorta: Secondary | ICD-10-CM | POA: Diagnosis not present

## 2022-10-29 DIAGNOSIS — I10 Essential (primary) hypertension: Secondary | ICD-10-CM | POA: Diagnosis not present

## 2022-10-30 ENCOUNTER — Encounter: Payer: Self-pay | Admitting: Nurse Practitioner

## 2022-11-03 ENCOUNTER — Ambulatory Visit: Payer: 59 | Attending: Nurse Practitioner

## 2022-11-03 DIAGNOSIS — E785 Hyperlipidemia, unspecified: Secondary | ICD-10-CM

## 2022-11-03 DIAGNOSIS — R0989 Other specified symptoms and signs involving the circulatory and respiratory systems: Secondary | ICD-10-CM

## 2022-12-10 ENCOUNTER — Other Ambulatory Visit: Payer: Self-pay | Admitting: Cardiology

## 2023-01-02 IMAGING — DX DG CHEST 2V
2 series · 2 of 2 positions shown · non-contrast
Comparison: December 07, 2015

CLINICAL DATA: Chest pain

EXAM:
CHEST - 2 VIEW

[chest pa]
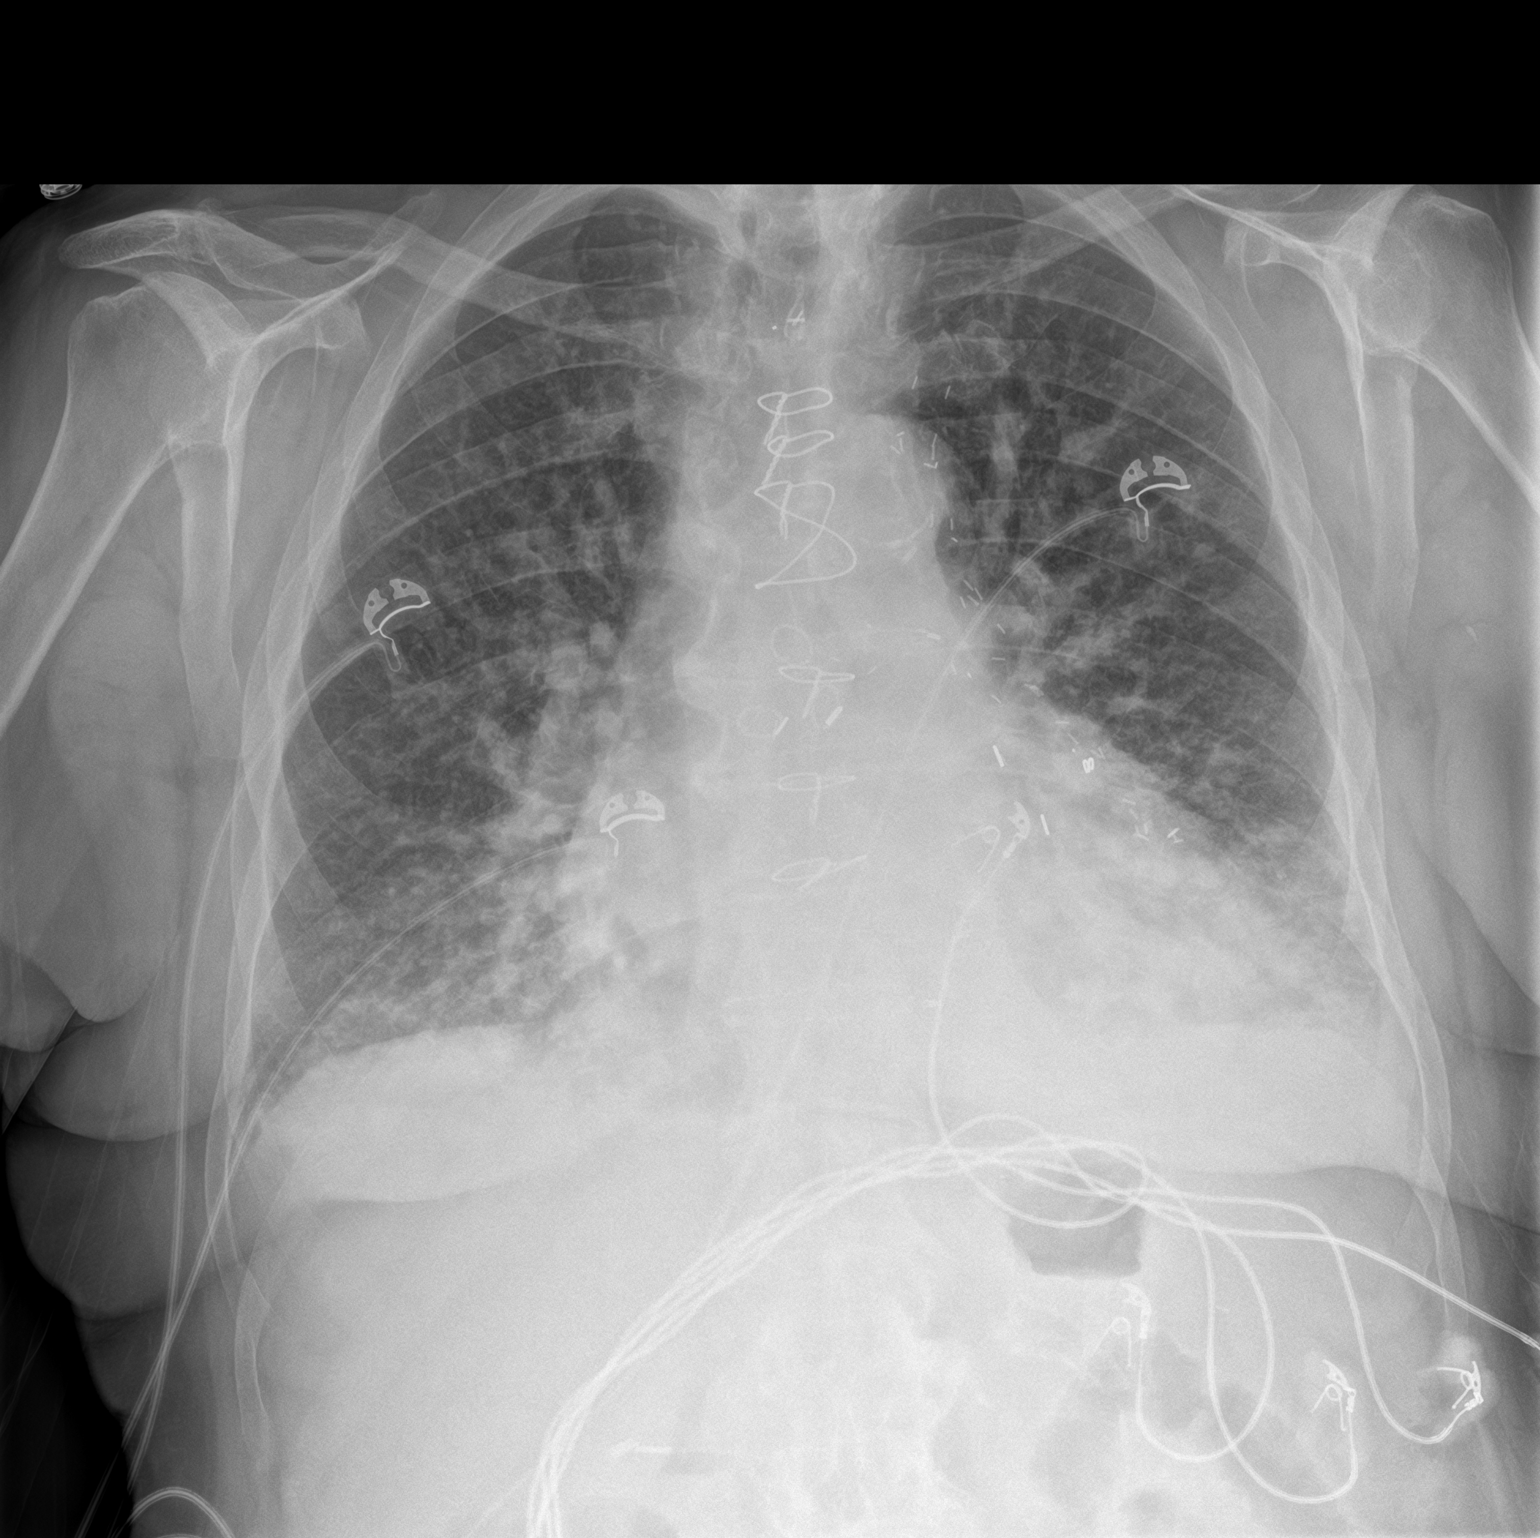

[chest lat]
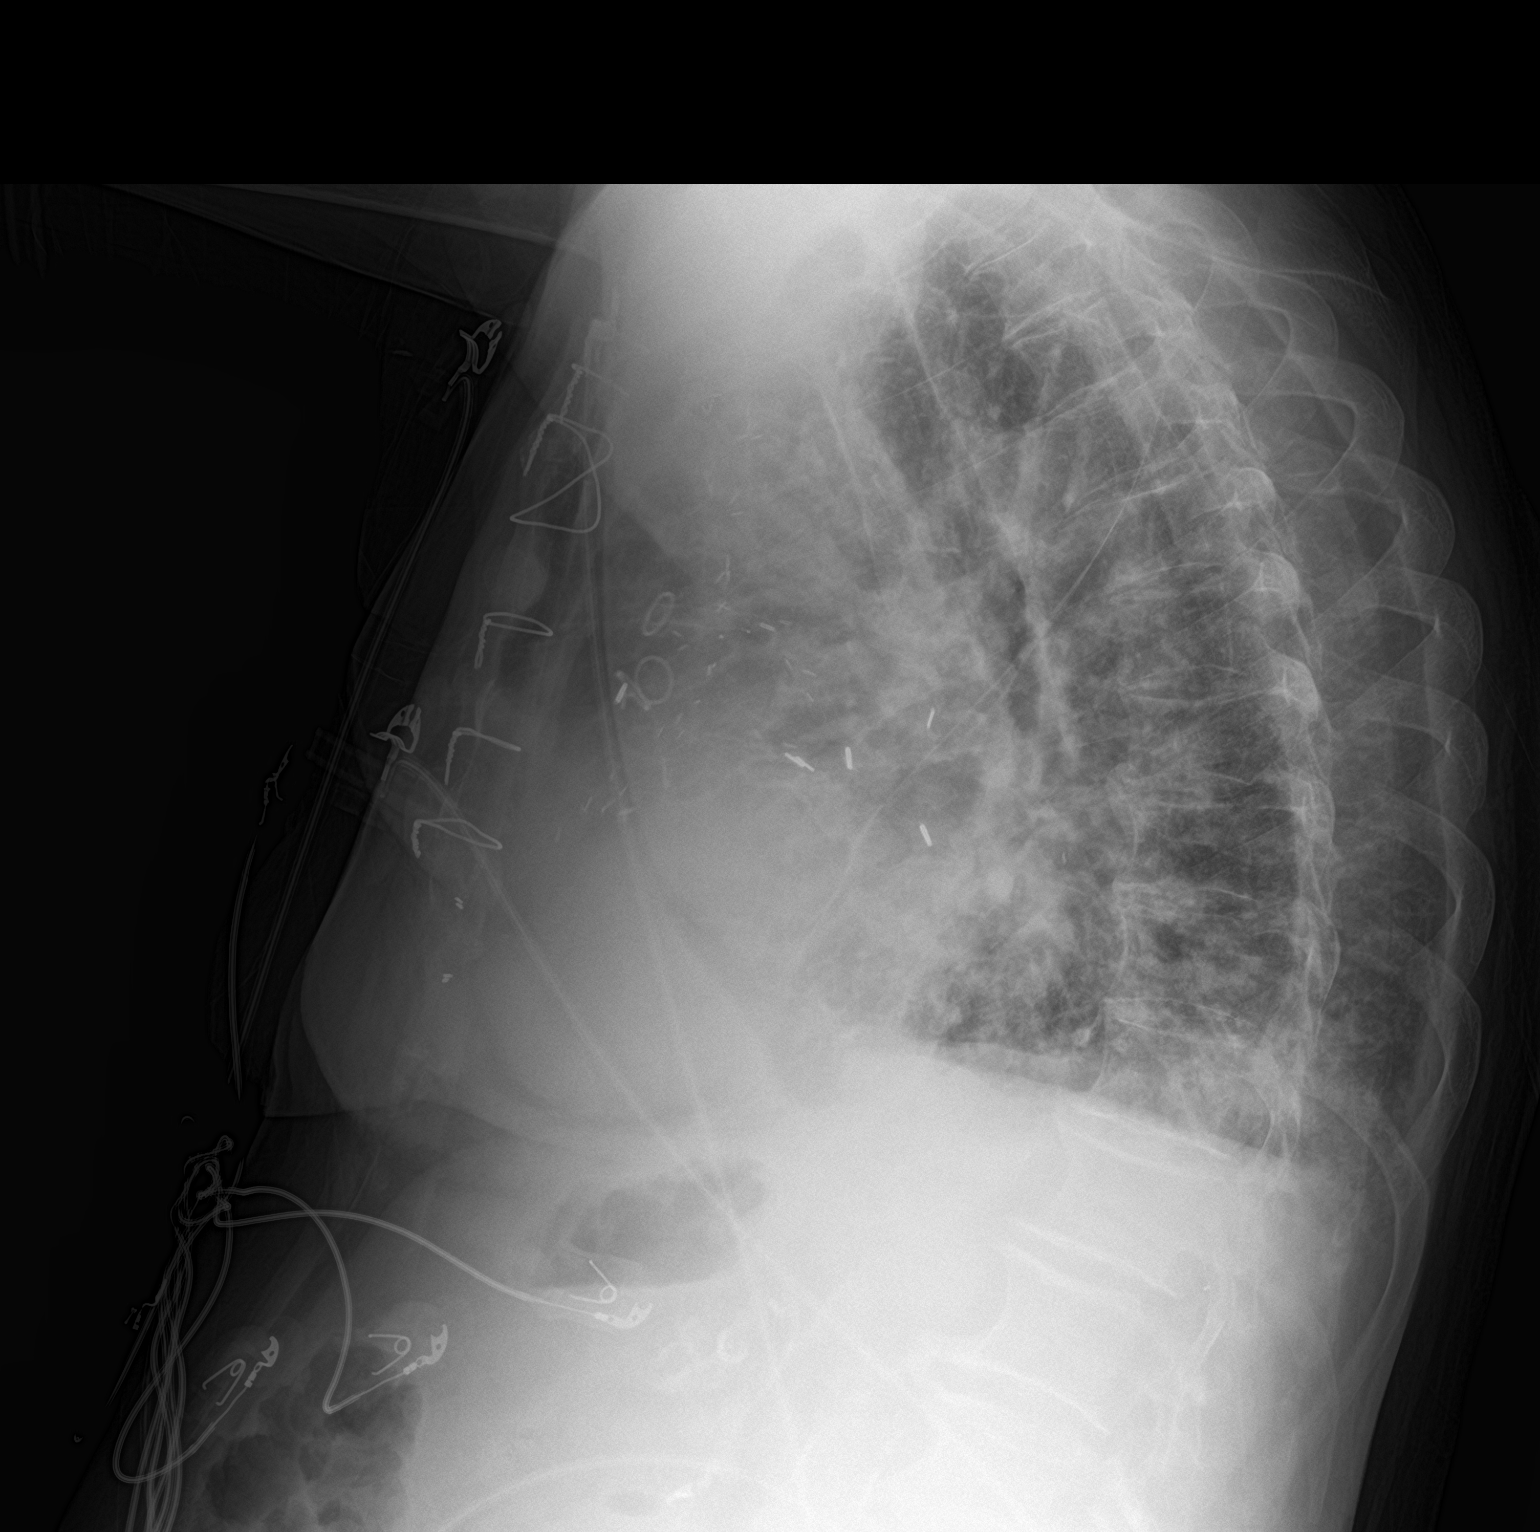

[2 of 2 positions shown; findings below may reference images not displayed]

FINDINGS: The cardiomediastinal silhouette is enlarged in contour.Status post
median sternotomy and CABG. Small bilateral pleural effusions.
Atherosclerotic calcifications of the aorta. No pneumothorax.
Perihilar vascular prominence, peribronchial cuffing, diffuse
interstitial prominence and bibasilar predominant heterogeneous
opacities. Status post cholecystectomy. Multilevel degenerative
changes of the thoracic spine.
IMPRESSION: Constellation of findings are favored to reflect pulmonary edema and
small bilateral pleural effusions. Differential considerations
include atypical infection.

## 2023-01-12 ENCOUNTER — Encounter: Payer: Self-pay | Admitting: Nurse Practitioner

## 2023-01-12 ENCOUNTER — Ambulatory Visit: Payer: 59 | Attending: Nurse Practitioner | Admitting: Nurse Practitioner

## 2023-01-12 VITALS — BP 122/72 | HR 60 | Ht 70.0 in | Wt 194.3 lb

## 2023-01-12 DIAGNOSIS — Z789 Other specified health status: Secondary | ICD-10-CM | POA: Diagnosis not present

## 2023-01-12 DIAGNOSIS — I6523 Occlusion and stenosis of bilateral carotid arteries: Secondary | ICD-10-CM

## 2023-01-12 DIAGNOSIS — R001 Bradycardia, unspecified: Secondary | ICD-10-CM

## 2023-01-12 DIAGNOSIS — I2510 Atherosclerotic heart disease of native coronary artery without angina pectoris: Secondary | ICD-10-CM

## 2023-01-12 DIAGNOSIS — R0989 Other specified symptoms and signs involving the circulatory and respiratory systems: Secondary | ICD-10-CM

## 2023-01-12 DIAGNOSIS — E782 Mixed hyperlipidemia: Secondary | ICD-10-CM | POA: Diagnosis not present

## 2023-01-12 DIAGNOSIS — I4821 Permanent atrial fibrillation: Secondary | ICD-10-CM

## 2023-01-12 NOTE — Patient Instructions (Addendum)
Medication Instructions:  Your physician has recommended you make the following change in your medication:  Please stop amLODipine (NORVASC) 2.5 MG tablet   Labwork: None   Testing/Procedures: None   Follow-Up: Your physician recommends that you schedule a follow-up appointment in: 3-4 months   Any Other Special Instructions Will Be Listed Below (If Applicable).  If you need a refill on your cardiac medications before your next appointment, please call your pharmacy.

## 2023-01-12 NOTE — Progress Notes (Unsigned)
Office Visit    Patient Name: William Elliott Date of Encounter: 10/09/2022 PCP:  Benita Stabile, MD [facility]  Cardiologist:  Nona Dell, MD  Advanced Practice Provider:  No care team member to display Electrophysiologist:  None   Chief Complaint    William Elliott is a 82 y.o. male with a hx of CAD, s/p CABG and drug-eluting stent to SVG to OM in 2017, permanent A-fib, mixed hyperlipidemia, history of lower GI bleed, bradycardia, HTN, dyslipidemia, and past history of stroke, who presents today for follow-up.  History of CABG and drug-eluting stent to SVG to OM 2017, also received drug-eluting stent to SVG to OM in 2022 with occlusion of SVG to PDA and SVG to diagonal, LIMA to LAD was patent.    Last seen by Dr. Diona Browner on November 19, 2021.  He was doing well at the time, was working on weight loss.  Today he presents for follow-up. At last visit, HR was found to be 45, asymptomatic. Metoprolol was stopped. Presents today for follow-up. Doing well, does admit some dizziness after taking his morning medications, goes away later in the day. BP is stable per his report. Denies any acute cardiac complaints or issues. Denies any chest pain, shortness of breath, palpitations, syncope, presyncope, orthopnea, PND, swelling or significant weight changes, acute bleeding, or claudication.  SH: Wife is Abdulloh Conroy, another patient of mine, have been married for close to 60 years.   EKGs/Labs/Other Studies Reviewed:   The following studies were reviewed today:  EKG:  EKG is not ordered today.    LHC 04/2020:  A stent was successfully placed.   99% mid body stenosis in SVG to obtuse marginal reduced to 10% with TIMI grade III flow using a 4.0 x 22 Onyx deployed at 12 atm.  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 (The previously placed ostial to proximal saphenous vein to OM stent overhangs into the ascending aorta preventing  engagement with extra-support guide catheters.  An internal mammary guide catheter was used to coaxially aim and advanced the guidewire into the graft). Chronic occlusion of SVG to PDA. Chronic occlusion of SVG to diagonal. Patent LIMA to the mid to distal LAD.  Supplies collaterals to the PDA. Total occlusion of the mid LAD.  95% ostial LAD stenosis with only septal perforator and a small diagonal at risk. Total occlusion of the mid circumflex Total occlusion of the mid RCA.  PDA is supplied by LAD collaterals when the LIMA graft to the LAD is visualized. Echocardiogram documents preserved LV systolic function, EF greater than 50%.  LVEDP is normal, 7 mmHg.   RECOMMENDATIONS:   Aspirin and Plavix for 12 months. If decision to use anticoagulation for management of atrial fibrillation is made, would recommend discontinuing aspirin after 1 month and continuing Plavix to complete 12 months  Echo 04/2020:  1. Left ventricular ejection fraction, by estimation, is 55 to 60%. The  left ventricle has normal function. The left ventricle demonstrates  regional wall motion abnormalities (see scoring diagram/findings for  description). There is severe left  ventricular hypertrophy. Left ventricular diastolic function could not be  evaluated. There is moderate hypokinesis of the left ventricular,  basal-mid inferior wall.   2. Right ventricular systolic function is low normal. The right  ventricular size is normal. Tricuspid regurgitation signal is inadequate  for assessing PA pressure.   3. Left atrial size was severely dilated.  4. Right atrial size was mildly dilated.   5. Moderate pleural effusion in the left lateral region.   6. The mitral valve is abnormal. Trivial mitral valve regurgitation.   7. The aortic valve is tricuspid. Aortic valve regurgitation is not  visualized. Mild aortic valve sclerosis is present, with no evidence of  aortic valve stenosis. Aortic valve mean gradient measures  6.0 mmHg.   8. The inferior vena cava is dilated in size with <50% respiratory  variability, suggesting right atrial pressure of 15 mmHg.   Comparison(s): Changes from prior study are noted. 12/09/15: LVEF 65-70%,  normal wall motion.  Risk Assessment/Calculations:   CHA2DS2-VASc Score = 6  This indicates a 9.7% annual risk of stroke. The patient's score is based upon: CHF History: 0 HTN History: 1 Diabetes History: 0 Stroke History: 2 Vascular Disease History: 1 Age Score: 2 Gender Score: 0  Review of Systems    All other systems reviewed and are otherwise negative except as noted above.  Physical Exam    VS:  BP 122/72   Pulse 60   Ht 5\' 10"  (1.778 m)   Wt 194 lb 4.8 oz (88.1 kg)   SpO2 98%   BMI 27.88 kg/m  , BMI Body mass index is 27.88 kg/m.  Wt Readings from Last 3 Encounters:  01/12/23 194 lb 4.8 oz (88.1 kg)  10/09/22 185 lb 9.6 oz (84.2 kg)  08/14/22 177 lb 3.2 oz (80.4 kg)     GEN: Well nourished, well developed, in no acute distress. HEENT: normal. Neck: Supple, no JVD, L carotid bruit and no R carotid bruit, no masses. Cardiac: S1/S2, irregular rhythm and slow rate, no murmurs, rubs, or gallops. No clubbing, cyanosis, edema.  Radials/PT 2+ and equal bilaterally.  Respiratory:  Respirations regular and unlabored, clear to auscultation bilaterally. MS: No deformity or atrophy. Skin: Warm and dry, no rash. Neuro:  Strength and sensation are intact. Psych: Normal affect.  Assessment & Plan    CAD, s/p CABG Hx graft dx and DES to SVG-OM in 2017, s/p DES to SVG-OM in 2022, occlusion of PDA and SVG diagonal, patent LIMA-LAD. Stable with no anginal symptoms. No indication for ischemic evaluation. Continue Aspirin, Imdur, Losartan, and NTG PRN. Heart healthy diet and regular cardiovascular exercise encouraged.   2. Permanent A-fib, bradycardia Denies any tachycardia or palpitations. HR today 49 bpm. Will arrange 7 day Zio XT monitor to evaluate for any  pauses for arrhythmias. No longer on BB. Heart healthy diet and regular cardiovascular exercise encouraged. Has consistently declined AC in the past.   3. Mixed HLD, hx of statin intolerance, left carotid bruit Past labs from PCP's office showed improved LDL. Continue current medication regimen. Heart healthy diet and regular cardiovascular exercise encouraged. Continue to follow with PCP. Left carotid bruit noted on exam, will arrange carotid doppler.   4. Dizziness Most likely d/t his medications contributing to this. Will change Lasix to 20 mg daily PRN for weight gain, shortness of breath, or leg edema, and decrease amlodipine to 2.5 mg daily and advised him to take this at night time. No other medication changes at this time. Discussed conservative measures and ED precautions discussed.   Disposition: Follow up in 3 months with Nona Dell, MD or APP.  Signed, Sharlene Dory, NP 01/12/2023, 3:18 PM [facility]

## 2023-01-17 ENCOUNTER — Other Ambulatory Visit: Payer: Self-pay

## 2023-01-17 ENCOUNTER — Inpatient Hospital Stay (HOSPITAL_COMMUNITY)
Admission: EM | Admit: 2023-01-17 | Discharge: 2023-01-22 | DRG: 377 | Disposition: A | Discharge status: Home / Self Care | Payer: 59 | Attending: Family Medicine | Admitting: Family Medicine

## 2023-01-17 ENCOUNTER — Emergency Department (HOSPITAL_COMMUNITY): Payer: 59

## 2023-01-17 ENCOUNTER — Encounter (HOSPITAL_COMMUNITY): Payer: Self-pay

## 2023-01-17 DIAGNOSIS — I2510 Atherosclerotic heart disease of native coronary artery without angina pectoris: Secondary | ICD-10-CM | POA: Diagnosis present

## 2023-01-17 DIAGNOSIS — R001 Bradycardia, unspecified: Secondary | ICD-10-CM | POA: Diagnosis present

## 2023-01-17 DIAGNOSIS — Z8249 Family history of ischemic heart disease and other diseases of the circulatory system: Secondary | ICD-10-CM | POA: Diagnosis not present

## 2023-01-17 DIAGNOSIS — I6523 Occlusion and stenosis of bilateral carotid arteries: Secondary | ICD-10-CM | POA: Diagnosis present

## 2023-01-17 DIAGNOSIS — I259 Chronic ischemic heart disease, unspecified: Secondary | ICD-10-CM | POA: Diagnosis present

## 2023-01-17 DIAGNOSIS — I4589 Other specified conduction disorders: Secondary | ICD-10-CM | POA: Diagnosis present

## 2023-01-17 DIAGNOSIS — Z515 Encounter for palliative care: Secondary | ICD-10-CM | POA: Diagnosis not present

## 2023-01-17 DIAGNOSIS — Z87891 Personal history of nicotine dependence: Secondary | ICD-10-CM | POA: Diagnosis not present

## 2023-01-17 DIAGNOSIS — Z9841 Cataract extraction status, right eye: Secondary | ICD-10-CM

## 2023-01-17 DIAGNOSIS — Z961 Presence of intraocular lens: Secondary | ICD-10-CM | POA: Diagnosis present

## 2023-01-17 DIAGNOSIS — Z955 Presence of coronary angioplasty implant and graft: Secondary | ICD-10-CM

## 2023-01-17 DIAGNOSIS — E782 Mixed hyperlipidemia: Secondary | ICD-10-CM | POA: Diagnosis present

## 2023-01-17 DIAGNOSIS — I4821 Permanent atrial fibrillation: Secondary | ICD-10-CM | POA: Diagnosis present

## 2023-01-17 DIAGNOSIS — K5731 Diverticulosis of large intestine without perforation or abscess with bleeding: Principal | ICD-10-CM | POA: Diagnosis present

## 2023-01-17 DIAGNOSIS — K5790 Diverticulosis of intestine, part unspecified, without perforation or abscess without bleeding: Secondary | ICD-10-CM

## 2023-01-17 DIAGNOSIS — Z79899 Other long term (current) drug therapy: Secondary | ICD-10-CM | POA: Diagnosis not present

## 2023-01-17 DIAGNOSIS — Z951 Presence of aortocoronary bypass graft: Secondary | ICD-10-CM | POA: Diagnosis not present

## 2023-01-17 DIAGNOSIS — I252 Old myocardial infarction: Secondary | ICD-10-CM | POA: Diagnosis not present

## 2023-01-17 DIAGNOSIS — I5023 Acute on chronic systolic (congestive) heart failure: Secondary | ICD-10-CM | POA: Diagnosis present

## 2023-01-17 DIAGNOSIS — N179 Acute kidney failure, unspecified: Secondary | ICD-10-CM | POA: Diagnosis present

## 2023-01-17 DIAGNOSIS — Z7189 Other specified counseling: Secondary | ICD-10-CM | POA: Diagnosis not present

## 2023-01-17 DIAGNOSIS — R0789 Other chest pain: Secondary | ICD-10-CM | POA: Diagnosis not present

## 2023-01-17 DIAGNOSIS — R9431 Abnormal electrocardiogram [ECG] [EKG]: Secondary | ICD-10-CM | POA: Diagnosis not present

## 2023-01-17 DIAGNOSIS — I25700 Atherosclerosis of coronary artery bypass graft(s), unspecified, with unstable angina pectoris: Secondary | ICD-10-CM | POA: Diagnosis not present

## 2023-01-17 DIAGNOSIS — I21A1 Myocardial infarction type 2: Secondary | ICD-10-CM | POA: Diagnosis present

## 2023-01-17 DIAGNOSIS — M6281 Muscle weakness (generalized): Secondary | ICD-10-CM | POA: Diagnosis not present

## 2023-01-17 DIAGNOSIS — Z9842 Cataract extraction status, left eye: Secondary | ICD-10-CM

## 2023-01-17 DIAGNOSIS — I2489 Other forms of acute ischemic heart disease: Secondary | ICD-10-CM | POA: Diagnosis present

## 2023-01-17 DIAGNOSIS — Z8673 Personal history of transient ischemic attack (TIA), and cerebral infarction without residual deficits: Secondary | ICD-10-CM | POA: Diagnosis not present

## 2023-01-17 DIAGNOSIS — I5083 High output heart failure: Secondary | ICD-10-CM | POA: Diagnosis not present

## 2023-01-17 DIAGNOSIS — K625 Hemorrhage of anus and rectum: Secondary | ICD-10-CM | POA: Diagnosis not present

## 2023-01-17 DIAGNOSIS — I5021 Acute systolic (congestive) heart failure: Secondary | ICD-10-CM | POA: Diagnosis not present

## 2023-01-17 DIAGNOSIS — Z789 Other specified health status: Secondary | ICD-10-CM | POA: Diagnosis not present

## 2023-01-17 DIAGNOSIS — I214 Non-ST elevation (NSTEMI) myocardial infarction: Secondary | ICD-10-CM | POA: Diagnosis not present

## 2023-01-17 DIAGNOSIS — R7989 Other specified abnormal findings of blood chemistry: Secondary | ICD-10-CM | POA: Diagnosis not present

## 2023-01-17 DIAGNOSIS — E876 Hypokalemia: Secondary | ICD-10-CM | POA: Diagnosis not present

## 2023-01-17 DIAGNOSIS — J4489 Other specified chronic obstructive pulmonary disease: Secondary | ICD-10-CM | POA: Diagnosis present

## 2023-01-17 DIAGNOSIS — Z888 Allergy status to other drugs, medicaments and biological substances status: Secondary | ICD-10-CM

## 2023-01-17 DIAGNOSIS — D62 Acute posthemorrhagic anemia: Secondary | ICD-10-CM | POA: Diagnosis present

## 2023-01-17 DIAGNOSIS — E7849 Other hyperlipidemia: Secondary | ICD-10-CM | POA: Diagnosis not present

## 2023-01-17 DIAGNOSIS — J449 Chronic obstructive pulmonary disease, unspecified: Secondary | ICD-10-CM | POA: Diagnosis present

## 2023-01-17 DIAGNOSIS — I493 Ventricular premature depolarization: Secondary | ICD-10-CM | POA: Diagnosis present

## 2023-01-17 DIAGNOSIS — Z7982 Long term (current) use of aspirin: Secondary | ICD-10-CM

## 2023-01-17 DIAGNOSIS — I10 Essential (primary) hypertension: Secondary | ICD-10-CM | POA: Diagnosis present

## 2023-01-17 DIAGNOSIS — I110 Hypertensive heart disease with heart failure: Secondary | ICD-10-CM | POA: Diagnosis present

## 2023-01-17 DIAGNOSIS — I4891 Unspecified atrial fibrillation: Secondary | ICD-10-CM | POA: Diagnosis not present

## 2023-01-17 DIAGNOSIS — R079 Chest pain, unspecified: Secondary | ICD-10-CM | POA: Diagnosis not present

## 2023-01-17 DIAGNOSIS — K922 Gastrointestinal hemorrhage, unspecified: Principal | ICD-10-CM | POA: Diagnosis present

## 2023-01-17 DIAGNOSIS — F419 Anxiety disorder, unspecified: Secondary | ICD-10-CM | POA: Diagnosis present

## 2023-01-17 DIAGNOSIS — J811 Chronic pulmonary edema: Secondary | ICD-10-CM | POA: Diagnosis not present

## 2023-01-17 DIAGNOSIS — Z823 Family history of stroke: Secondary | ICD-10-CM

## 2023-01-17 DIAGNOSIS — R0989 Other specified symptoms and signs involving the circulatory and respiratory systems: Secondary | ICD-10-CM | POA: Diagnosis not present

## 2023-01-17 DIAGNOSIS — I3481 Nonrheumatic mitral (valve) annulus calcification: Secondary | ICD-10-CM | POA: Diagnosis present

## 2023-01-17 DIAGNOSIS — J90 Pleural effusion, not elsewhere classified: Secondary | ICD-10-CM | POA: Diagnosis not present

## 2023-01-17 DIAGNOSIS — K921 Melena: Secondary | ICD-10-CM | POA: Diagnosis not present

## 2023-01-17 DIAGNOSIS — I25709 Atherosclerosis of coronary artery bypass graft(s), unspecified, with unspecified angina pectoris: Secondary | ICD-10-CM | POA: Diagnosis not present

## 2023-01-17 DIAGNOSIS — R918 Other nonspecific abnormal finding of lung field: Secondary | ICD-10-CM | POA: Diagnosis not present

## 2023-01-17 LAB — HEPATIC FUNCTION PANEL
ALT: 14 U/L (ref 0–44)
AST: 20 U/L (ref 15–41)
Albumin: 3.7 g/dL (ref 3.5–5.0)
Alkaline Phosphatase: 47 U/L (ref 38–126)
Bilirubin, Direct: 0.2 mg/dL (ref 0.0–0.2)
Indirect Bilirubin: 0.7 mg/dL (ref 0.3–0.9)
Total Bilirubin: 0.9 mg/dL (ref <1.2)
Total Protein: 6.4 g/dL — ABNORMAL LOW (ref 6.5–8.1)

## 2023-01-17 LAB — BASIC METABOLIC PANEL
Anion gap: 11 (ref 5–15)
BUN: 27 mg/dL — ABNORMAL HIGH (ref 8–23)
CO2: 23 mmol/L (ref 22–32)
Calcium: 8.5 mg/dL — ABNORMAL LOW (ref 8.9–10.3)
Chloride: 100 mmol/L (ref 98–111)
Creatinine, Ser: 1.36 mg/dL — ABNORMAL HIGH (ref 0.61–1.24)
GFR, Estimated: 53 mL/min — ABNORMAL LOW (ref >60)
Glucose, Bld: 135 mg/dL — ABNORMAL HIGH (ref 70–99)
Potassium: 3.9 mmol/L (ref 3.5–5.1)
Sodium: 134 mmol/L — ABNORMAL LOW (ref 135–145)

## 2023-01-17 LAB — CBC
HCT: 19.7 % — ABNORMAL LOW (ref 39.0–52.0)
Hemoglobin: 6.1 g/dL — CL (ref 13.0–17.0)
MCH: 26.9 pg (ref 26.0–34.0)
MCHC: 31 g/dL (ref 30.0–36.0)
MCV: 86.8 fL (ref 80.0–100.0)
Platelets: 279 10*3/uL (ref 150–400)
RBC: 2.27 MIL/uL — ABNORMAL LOW (ref 4.22–5.81)
RDW: 15.8 % — ABNORMAL HIGH (ref 11.5–15.5)
WBC: 8.4 10*3/uL (ref 4.0–10.5)
nRBC: 0 % (ref 0.0–0.2)

## 2023-01-17 LAB — GLUCOSE, CAPILLARY
Glucose-Capillary: 102 mg/dL — ABNORMAL HIGH (ref 70–99)
Glucose-Capillary: 103 mg/dL — ABNORMAL HIGH (ref 70–99)
Glucose-Capillary: 120 mg/dL — ABNORMAL HIGH (ref 70–99)

## 2023-01-17 LAB — TROPONIN I (HIGH SENSITIVITY)
Troponin I (High Sensitivity): 36 ng/L — ABNORMAL HIGH (ref <18)
Troponin I (High Sensitivity): 134 ng/L (ref <18)

## 2023-01-17 LAB — PROTIME-INR
INR: 1.2 (ref 0.8–1.2)
Prothrombin Time: 15.2 s (ref 11.4–15.2)

## 2023-01-17 LAB — PREPARE RBC (CROSSMATCH)

## 2023-01-17 LAB — BRAIN NATRIURETIC PEPTIDE: B Natriuretic Peptide: 239 pg/mL — ABNORMAL HIGH (ref 0.0–100.0)

## 2023-01-17 LAB — MRSA NEXT GEN BY PCR, NASAL: MRSA by PCR Next Gen: NOT DETECTED

## 2023-01-17 LAB — ABO/RH: ABO/RH(D): O POS

## 2023-01-17 MED ORDER — FUROSEMIDE 10 MG/ML IJ SOLN
40.0000 mg | Freq: Once | INTRAMUSCULAR | Status: AC
  Administered 2023-01-17: 40 mg via INTRAVENOUS

## 2023-01-17 MED ORDER — ORAL CARE MOUTH RINSE: 15.0000 mL | OROMUCOSAL | Status: DC | PRN

## 2023-01-17 MED ORDER — LOSARTAN POTASSIUM 50 MG PO TABS
50.0000 mg | ORAL_TABLET | Freq: Every day | ORAL | Status: DC
  Administered 2023-01-17 – 2023-01-22 (×6): 50 mg via ORAL
  Filled 2023-01-17 (×6): qty 1

## 2023-01-17 MED ORDER — CHLORHEXIDINE GLUCONATE CLOTH 2 % EX PADS
6.0000 | MEDICATED_PAD | Freq: Every day | CUTANEOUS | Status: DC
  Administered 2023-01-17 – 2023-01-19 (×3): 6 via TOPICAL

## 2023-01-17 MED ORDER — ALBUTEROL SULFATE (2.5 MG/3ML) 0.083% IN NEBU: 2.5000 mg | INHALATION_SOLUTION | RESPIRATORY_TRACT | Status: DC | PRN

## 2023-01-17 MED ORDER — METOPROLOL TARTRATE 25 MG PO TABS
12.5000 mg | ORAL_TABLET | Freq: Two times a day (BID) | ORAL | Status: AC
  Administered 2023-01-18: 12.5 mg via ORAL
  Filled 2023-01-17 (×2): qty 1

## 2023-01-17 MED ORDER — UMECLIDINIUM BROMIDE 62.5 MCG/ACT IN AEPB
1.0000 | INHALATION_SPRAY | Freq: Every day | RESPIRATORY_TRACT | Status: DC
  Administered 2023-01-18 – 2023-01-22 (×3): 1 via RESPIRATORY_TRACT
  Filled 2023-01-17 (×2): qty 7

## 2023-01-17 MED ORDER — SODIUM CHLORIDE 0.9 % IV SOLN: INTRAVENOUS | Status: AC | PRN

## 2023-01-17 MED ORDER — ARFORMOTEROL TARTRATE 15 MCG/2ML IN NEBU
15.0000 ug | INHALATION_SOLUTION | Freq: Two times a day (BID) | RESPIRATORY_TRACT | Status: DC
  Administered 2023-01-17 – 2023-01-22 (×11): 15 ug via RESPIRATORY_TRACT
  Filled 2023-01-17 (×12): qty 2

## 2023-01-17 MED ORDER — ISOSORBIDE MONONITRATE ER 30 MG PO TB24
30.0000 mg | ORAL_TABLET | Freq: Every day | ORAL | Status: DC
  Administered 2023-01-17 – 2023-01-22 (×6): 30 mg via ORAL
  Filled 2023-01-17 (×6): qty 1

## 2023-01-17 MED ORDER — TRAZODONE HCL 50 MG PO TABS
50.0000 mg | ORAL_TABLET | Freq: Every evening | ORAL | Status: DC | PRN
  Administered 2023-01-17 – 2023-01-19 (×2): 50 mg via ORAL
  Filled 2023-01-17 (×2): qty 1

## 2023-01-17 MED ORDER — ONDANSETRON HCL 4 MG PO TABS: 4.0000 mg | ORAL_TABLET | Freq: Four times a day (QID) | ORAL | Status: DC | PRN

## 2023-01-17 MED ORDER — BEMPEDOIC ACID-EZETIMIBE 180-10 MG PO TABS: 1.0000 | ORAL_TABLET | Freq: Every day | ORAL | Status: DC

## 2023-01-17 MED ORDER — ACETAMINOPHEN 650 MG RE SUPP: 650.0000 mg | Freq: Four times a day (QID) | RECTAL | Status: DC | PRN

## 2023-01-17 MED ORDER — PANTOPRAZOLE SODIUM 40 MG IV SOLR
40.0000 mg | Freq: Two times a day (BID) | INTRAVENOUS | Status: DC
  Administered 2023-01-17 – 2023-01-21 (×9): 40 mg via INTRAVENOUS
  Filled 2023-01-17 (×9): qty 10

## 2023-01-17 MED ORDER — ALPRAZOLAM 0.5 MG PO TABS
1.0000 mg | ORAL_TABLET | Freq: Two times a day (BID) | ORAL | Status: DC | PRN
  Administered 2023-01-17 – 2023-01-22 (×8): 1 mg via ORAL
  Filled 2023-01-17 (×8): qty 2

## 2023-01-17 MED ORDER — POLYETHYLENE GLYCOL 3350 17 G PO PACK: 17.0000 g | PACK | Freq: Every day | ORAL | Status: DC | PRN

## 2023-01-17 MED ORDER — BOOST / RESOURCE BREEZE PO LIQD CUSTOM
1.0000 | Freq: Three times a day (TID) | ORAL | Status: DC
  Administered 2023-01-17 – 2023-01-21 (×9): 1 via ORAL

## 2023-01-17 MED ORDER — FUROSEMIDE 10 MG/ML IJ SOLN
40.0000 mg | Freq: Every day | INTRAMUSCULAR | Status: DC
  Administered 2023-01-18 – 2023-01-19 (×2): 40 mg via INTRAVENOUS
  Filled 2023-01-17 (×3): qty 4

## 2023-01-17 MED ORDER — ACETAMINOPHEN 325 MG PO TABS
650.0000 mg | ORAL_TABLET | Freq: Four times a day (QID) | ORAL | Status: DC | PRN
  Administered 2023-01-19: 650 mg via ORAL
  Filled 2023-01-17: qty 2

## 2023-01-17 MED ORDER — SODIUM CHLORIDE 0.9% FLUSH
3.0000 mL | Freq: Two times a day (BID) | INTRAVENOUS | Status: DC
  Administered 2023-01-18 – 2023-01-19 (×4): 3 mL via INTRAVENOUS

## 2023-01-17 MED ORDER — BISACODYL 10 MG RE SUPP: 10.0000 mg | Freq: Every day | RECTAL | Status: DC | PRN

## 2023-01-17 MED ORDER — SODIUM CHLORIDE 0.9% FLUSH: 3.0000 mL | INTRAVENOUS | Status: DC | PRN

## 2023-01-17 MED ORDER — SODIUM CHLORIDE 0.9% FLUSH
3.0000 mL | Freq: Two times a day (BID) | INTRAVENOUS | Status: DC
  Administered 2023-01-17 – 2023-01-22 (×9): 3 mL via INTRAVENOUS

## 2023-01-17 MED ORDER — POTASSIUM CHLORIDE CRYS ER 20 MEQ PO TBCR
20.0000 meq | EXTENDED_RELEASE_TABLET | Freq: Two times a day (BID) | ORAL | Status: DC
  Administered 2023-01-17 – 2023-01-18 (×4): 20 meq via ORAL
  Filled 2023-01-17 (×4): qty 1

## 2023-01-17 MED ORDER — ONDANSETRON HCL 4 MG/2ML IJ SOLN: 4.0000 mg | Freq: Four times a day (QID) | INTRAMUSCULAR | Status: DC | PRN

## 2023-01-17 MED ORDER — METOPROLOL TARTRATE 25 MG PO TABS: 12.5000 mg | ORAL_TABLET | Freq: Two times a day (BID) | ORAL | Status: DC

## 2023-01-17 MED ORDER — SODIUM CHLORIDE 0.9% IV SOLUTION
Freq: Once | INTRAVENOUS | Status: AC
Start: 2023-01-17 — End: 2023-01-17

## 2023-01-17 MED ORDER — SODIUM CHLORIDE 0.9% IV SOLUTION: Freq: Once | INTRAVENOUS | Status: AC

## 2023-01-17 NOTE — ED Notes (Signed)
Date and time results received: 01/17/23 1020 (use smartphrase ".now" to insert current time)  Test: heme Critical Value: 6.1  Name of Provider Notified: Rubin Payor

## 2023-01-17 NOTE — ED Notes (Signed)
Pt in bed, pt states that he is having some sob, pt placed on 2L O2 via [place], repositioned pt.  Pt states that he is breathing better, pt satting 99% on 2L via [place]

## 2023-01-17 NOTE — ED Notes (Signed)
Chaperoned MD for rectal exam, pt has bloody stool, per md pt is heme positive and occult card is not necessary.

## 2023-01-17 NOTE — H&P (Signed)
Patient Demographics:    William Elliott, is a 82 y.o. male  MRN: [id number]   DOB - Aug 26, 1940  Admit Date - 01/17/2023  Outpatient Primary MD for the patient is Benita Stabile, MD   Assessment & Plan:   Assessment and Plan:  1) acute on 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-----Had dark BM on 01/16/2023 -Exam by EDP reveals hematochezia no abdominal pain cannot rule out diverticular bleed, upper GI bleed remains in the differential --Hemoglobin 6.1 baseline usually between 10 and 11, hematocrit 19.7 platelets 279  =-GI consult appreciated -Transfused 2 units of PRBC with Lasix in between -Defer to GI team regarding timing of possible endoluminal evaluation -Protonix as ordered  2) chest pains the patient with CAD/elevated troponins ---H/o  CAD s/p 4V CABG - 1999 with LIMA-LAD, SVG-DIAG, SVG-OM, SVG-RPDA and DES to SVG-OM in 2017-- ----Initial troponin 36 repeat troponin 134 -BNP 239 -EKG with atrial fibrillation rate controlled with PVCs no ACS type findings -LHC in February 2022 with obstructive CAD, stents were placed -Avoid aspirin or heparin given acute GI bleed as above #1 -Add   Imdur -Add low-dose metoprolol --watch closely as patient has history of chronotropic incompetence and propensity towards bradycardia -Echo requested -Cardiology consult in a.m.  3) permanent A-fib--- propensity towards bradycardia (history of chronotropic incompetence)--not on chronic anticoagulation due to recurrent GI bleed requiring transfusion - 4) history of CVA--patient is intolerant to statins -No aspirin due to #1 above  5)AKI----acute kidney injury  -Creatinine up to 1.36 baseline 0.8-1.0 -- renally adjust medications, avoid nephrotoxic agents / dehydration  / hypotension  6) social/ethics--- plan of care and  advanced directive discussed with patient,  patient's wife and son [name] at bedside -he is  a full code -Get palliative care consult  7)COPD--- continue bronchodilators, no acute exacerbation at this time  8)HFpEF--- history of chronic diastolic dysfunction CHF echo from 04/21/2020 with EF of 55 to 60% --BNP 239 --Chest x-ray suggestive of pulmonary edema and trace pleural effusions -IV Lasix as ordered -Troponins elevated as above -Repeat echo requested  Status is: Inpatient  Remains inpatient appropriate because:   Dispo: The patient is from: Home              Anticipated d/c is to: Home              Anticipated d/c date is: 3 days              Patient currently is not medically stable to d/c. Barriers: Not Clinically Stable-    With History of - Reviewed by me  Past Medical History:  Diagnosis Date   Acute lower GI bleeding 2008   Anxiety    Asthma    Carotid artery disease (HCC)    Asymptomatic left carotid bruit   COPD (chronic obstructive pulmonary disease) (HCC)    Coronary artery disease    a. CABG - 1999 with LIMA-LAD, SVG-DIAG, SVG-OM, SVG-RPDA  b. Cath in setting of NSTEMI 03/18/2015: patent LIMA-LAD, occluded SVG-RCA, occluded SVG-D1, 99% stenosis of SVG-OM treated w/ DES   Depression    Dyslipidemia    Erectile dysfunction    Essential hypertension    History of blood transfusion 2008   History of stroke    Hyperlipidemia    Myocardial infarction [facility])       Past Surgical History:  Procedure Laterality Date   CARDIAC CATHETERIZATION     CARDIAC CATHETERIZATION N/A 03/18/2015   Procedure: Left Heart Cath and Cors/Grafts Angiography;  Surgeon: Marykay Lex, MD;  Location: [facility] INVASIVE CV LAB;  Service: Cardiovascular;  Laterality: N/A;   CARDIAC CATHETERIZATION N/A 03/18/2015   Procedure: Coronary Stent Intervention;  Surgeon: Marykay Lex, MD;  Location: [facility] INVASIVE CV LAB;  Service: Cardiovascular;  Laterality: N/A;   CATARACT EXTRACTION W/  INTRAOCULAR LENS  IMPLANT, BILATERAL Bilateral    CORONARY ANGIOPLASTY     CORONARY ARTERY BYPASS GRAFT  1999   CORONARY STENT INTERVENTION N/A 04/22/2020   Procedure: CORONARY STENT INTERVENTION;  Surgeon: Lyn Records, MD;  Location: [facility] INVASIVE CV LAB;  Service: Cardiovascular;  Laterality: N/A;   FRACTURE SURGERY     HERNIA REPAIR     LEFT HEART CATH AND CORS/GRAFTS ANGIOGRAPHY N/A 04/22/2020   Procedure: LEFT HEART CATH AND CORS/GRAFTS ANGIOGRAPHY;  Surgeon: Lyn Records, MD;  Location: [facility] INVASIVE CV LAB;  Service: Cardiovascular;  Laterality: N/A;   TIBIA FRACTURE SURGERY Left [facility]; took bone out of my right hip   TONSILLECTOMY     UMBILICAL HERNIA REPAIR  ~ 2000    Chief Complaint  Patient presents with   Chest Pain      HPI:    William Elliott  is a 82 y.o. male with Pmhx relevant for permanent atrial fibrillation not on [facility] (due to history of GI bleed), CAD s/p 4V CABG - 1999 with LIMA-LAD, SVG-DIAG, SVG-OM, SVG-RPDA and DES to SVG-OM in 2017, HTN, HLD, COPD, bilateral carotid artery stenosis, history of CVA, history of GI bleeding requiring transfusion presents to the ED with chest discomfort radiating to the left shoulder and jaw , associated with dyspnea as well--he took nitro with no relief -Had dark BM on 01/16/2023 -No neck pains, no leg swelling or pleuritic symptoms No fevers no chills no productive cough -Additional history obtained from patient's wife and son [name] at bedside -In the ED patient found to have hemoglobin of 6.1 EDP performed rectal exam with hematochezia noted -GI service was consulted and transfusion of PRBC was initiated -Chest x-ray suggestive of pulmonary edema and trace pleural effusions -Creatinine up to 1.36 baseline 0.8-1.0 -Hemoglobin 6.1 baseline usually between 10 and 11, hematocrit 19.7 platelets 279 WBC 8.4 -Initial troponin 36 repeat troponin 134 -INR 1.2 -BNP 239 -LFTs are not elevated  Review of systems:    In  addition to the HPI above,   A full Review of  Systems was done, all other systems reviewed are negative except as noted above in HPI , .    Social History:  Reviewed by me   Social History   Tobacco Use   Smoking status: Former    Types: Cigars    Quit date: 03/02/1997    Years since quitting: 25.8    Passive exposure: Never   Smokeless tobacco: Never   Tobacco comments:    smoked cigars x 10 yrs, never smoked cigarettes  Substance Use Topics   Alcohol use:  No    Alcohol/week: 0.0 standard drinks of alcohol    Comment: "drank some when I was young; quit in the 1990s"    Family History :  Reviewed by me    Family History  Problem Relation Age of Onset   Heart disease Father    Heart attack Father    Hypertension Mother    Stroke Mother     Home Medications:   Prior to Admission medications   Medication Sig Start Date End Date Taking? Authorizing Provider  albuterol (VENTOLIN HFA) 108 (90 Base) MCG/ACT inhaler Inhale 2 puffs into the lungs every 6 (six) hours as needed for wheezing or shortness of breath.   Yes [provider]  ALPRAZolam Prudy Feeler) 1 MG tablet Take 1 tablet (1 mg total) by mouth at bedtime as needed for anxiety. Patient taking differently: Take 1 mg by mouth 2 (two) times daily as needed for anxiety. 11/01/16  Yes Triplett, Tammy, PA-C  ascorbic acid (VITAMIN C) 500 MG tablet Take 500 mg by mouth daily.   Yes [provider]  aspirin EC 81 MG tablet Take 1 tablet (81 mg total) by mouth daily. 02/12/17  Yes Jonelle Sidle, MD  b complex vitamins tablet Take 1 tablet by mouth daily.   Yes [provider]  calcium carbonate (TUMS - DOSED IN MG ELEMENTAL CALCIUM) 500 MG chewable tablet Chew 1 tablet by mouth daily as needed for indigestion or heartburn.   Yes [provider]  Cholecalciferol (VITAMIN D3) 25 MCG (1000 UT) CAPS Take 1 capsule by mouth in the morning and at bedtime.   Yes [provider]   diphenhydrAMINE (BENADRYL) 25 mg capsule Take 50 mg by mouth every 6 (six) hours as needed for sleep.   Yes [provider]  FEROSUL 325 (65 Fe) MG tablet Take 325 mg by mouth daily. 05/23/21  Yes [provider]  furosemide (LASIX) 20 MG tablet Take 20 mg by mouth daily.   Yes [provider]  hydrochlorothiazide (HYDRODIURIL) 25 MG tablet TAKE 1 TABLET BY MOUTH EVERY DAY Patient taking differently: Take 25 mg by mouth 3 (three) times a week. 10/27/17  Yes Jonelle Sidle, MD  isosorbide mononitrate (IMDUR) 30 MG 24 hr tablet TAKE 1 TABLET BY MOUTH TWICE DAILY 03/23/22  Yes Jonelle Sidle, MD  losartan (COZAAR) 50 MG tablet Take 50 mg by mouth daily. 01/20/19  Yes [provider]  MAGNESIUM OXIDE PO Take 1 tablet by mouth daily.   Yes [provider]  multivitamin [facility]) per tablet Take 1 tablet by mouth daily.   Yes [provider]  NEXLIZET 180-10 MG TABS Take 1 tablet by mouth daily. 07/23/22  Yes [provider]  nitroGLYCERIN (NITROSTAT) 0.4 MG SL tablet place 1 tablet UNDER THE TONGUE EVERY 5 MINUTES UP TO 3 times AS NEEDED FOR CHEST pain if no RELIEF AFTER 3rd DOSE proceed TO THE ED FOR an evaluation 09/28/22  Yes Jonelle Sidle, MD  Omega 3 1000 MG CAPS Take 1 capsule by mouth daily.   Yes [provider]  pantoprazole (PROTONIX) 40 MG tablet TAKE 1 TABLET BY MOUTH EVERY DAY 02/09/19  Yes Jonelle Sidle, MD  potassium chloride SA (KLOR-CON M) 20 MEQ tablet TAKE 1 TABLET BY MOUTH TWICE DAILY 12/10/22  Yes Jonelle Sidle, MD  Tiotropium Bromide-Olodaterol (STIOLTO RESPIMAT) 2.5-2.5 MCG/ACT AERS Inhale 1 puff into the lungs 2 (two) times a day.   Yes [provider]  Zinc 50 MG CAPS Take 1 capsule by mouth daily.   Yes [provider]    Allergies:     Allergies  Allergen Reactions   Anoro Ellipta [Umeclidinium-Vilanterol] Other (See Comments)    Cardiac problems   Lipitor  [Atorvastatin Calcium] Other (See Comments)    Not known    Niaspan [Niacin] Other (See Comments)    Not known   Zocor [Simvastatin] Other (See Comments)    Not known   Fenofibrate Other (See Comments)    No energy     Physical Exam:   Vitals  Blood pressure (!) 152/86, pulse 79, temperature 98 F (36.7 C), temperature source Oral, resp. rate (!) 24, height 5\' 10"  (1.778 m), weight 84 kg, SpO2 99%.  Physical Examination: General appearance - alert,  in no distress  Mental status - alert, oriented to person, place, and time,  Eyes - sclera anicteric Neck - supple, no JVD elevation , Chest - clear  to auscultation bilaterally, symmetrical air movement,  Heart - S1 and S2 normal, regular , CABG scar Abdomen - soft, nontender, nondistended, +BS Neurological - screening mental status exam normal, neck supple without rigidity, cranial nerves II through XII intact, DTR's normal and symmetric Extremities - no pedal edema noted, intact peripheral pulses , prominent Varicose veins Skin - warm, dry    Data Review:    CBC Recent Labs  Lab 01/17/23 1001  WBC 8.4  HGB 6.1*  HCT 19.7*  PLT 279  MCV 86.8  MCH 26.9  MCHC 31.0  RDW 15.8*   ------------------------------------------------------------------------------------------------------------------  Chemistries  Recent Labs  Lab 01/17/23 1001 01/17/23 1007  NA 134*  --   K 3.9  --   CL 100  --   CO2 23  --   GLUCOSE 135*  --   BUN 27*  --   CREATININE 1.36*  --   CALCIUM 8.5*  --   AST  --  20  ALT  --  14  ALKPHOS  --  47  BILITOT  --  0.9   ------------------------------------------------------------------------------------------------------------------ estimated creatinine clearance is 44.7 mL/min (A) (by C-G formula based on SCr of 1.36 mg/dL (H)). ------------------------------------------------------------------------------------------------------------------ Coagulation profile Recent Labs  Lab  01/17/23 1029  INR 1.2   ------------------------------------------------------------------------------------------------------------------    Component Value Date/Time   BNP 239.0 (H) 01/17/2023 1007   Urinalysis    Component Value Date/Time   COLORURINE YELLOW 12/07/2015 0545   APPEARANCEUR CLEAR 12/07/2015 0545   LABSPEC 1.010 12/07/2015 0545   PHURINE 6.0 12/07/2015 0545   GLUCOSEU NEGATIVE 12/07/2015 0545   HGBUR NEGATIVE 12/07/2015 0545   BILIRUBINUR NEGATIVE 12/07/2015 0545   KETONESUR NEGATIVE 12/07/2015 0545   PROTEINUR NEGATIVE 12/07/2015 0545   NITRITE NEGATIVE 12/07/2015 0545   LEUKOCYTESUR NEGATIVE 12/07/2015 0545     Imaging Results:    DG Chest Port 1 View  Result Date: 01/17/2023 CLINICAL DATA:  Atrial fibrillation and chest pain EXAM: PORTABLE CHEST 1 VIEW COMPARISON:  Chest radiograph dated 04/19/2020 FINDINGS: Normal lung volumes. Severe bilateral interstitial opacities. Trace blunting of the bilateral costophrenic angles. No pneumothorax. Similar enlarged cardiomediastinal silhouette. Median sternotomy wires are nondisplaced. IMPRESSION: 1. Severe bilateral interstitial opacities, likely pulmonary edema. 2. Trace blunting of the bilateral costophrenic angles, which may represent trace pleural effusions. 3. Similar cardiomegaly. Electronically Signed   By: Agustin Cree M.D.   On: 01/17/2023 12:31    Radiological Exams on Admission: DG Chest Port 1 View  Result Date: 01/17/2023 CLINICAL DATA:  Atrial  fibrillation and chest pain EXAM: PORTABLE CHEST 1 VIEW COMPARISON:  Chest radiograph dated 04/19/2020 FINDINGS: Normal lung volumes. Severe bilateral interstitial opacities. Trace blunting of the bilateral costophrenic angles. No pneumothorax. Similar enlarged cardiomediastinal silhouette. Median sternotomy wires are nondisplaced. IMPRESSION: 1. Severe bilateral interstitial opacities, likely pulmonary edema. 2. Trace blunting of the bilateral costophrenic angles,  which may represent trace pleural effusions. 3. Similar cardiomegaly. Electronically Signed   By: Agustin Cree M.D.   On: 01/17/2023 12:31    DVT Prophylaxis -SCD/Gi Bleed AM Labs Ordered, also please review Full Orders  Family Communication: Admission, patients condition and plan of care including tests being ordered have been discussed with the patient and wife and son  who indicate understanding and agree with the plan   Condition  -stable   Shon Hale M.D on 01/17/2023 at 5:22 PM Go to [email or web address] -  for contact info  [facility] - Office  [phone]

## 2023-01-17 NOTE — ED Provider Notes (Signed)
[place] EMERGENCY DEPARTMENT AT [facility] Provider Note   CSN: [id number] Arrival date & time: 01/17/23  4098     History  Chief Complaint  Patient presents with   Chest Pain    William Elliott is a 82 y.o. male.   Chest Pain Patient presents with chest pain shortness of breath.  Also some tightness in his arms and jaw.  Started since yesterday.  Nitroglycerin did not help.  History of MI.  Previous CABG.  Also previous GI bleeds.  Patient states he has had some dark stool.  States started bloody and then became darker.  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  Not on anticoagulation but does have history of A-fib.  Recently saw cardiology and had decrease in his amlodipine due to swelling of his legs.    Past Medical History:  Diagnosis Date   Acute lower GI bleeding 2008   Anxiety    Asthma    Carotid artery disease (HCC)    Asymptomatic left carotid bruit   COPD (chronic obstructive pulmonary disease) (HCC)    Coronary artery disease    a. CABG - 1999 with LIMA-LAD, SVG-DIAG, SVG-OM, SVG-RPDA  b. Cath in setting of NSTEMI 03/18/2015: patent LIMA-LAD, occluded SVG-RCA, occluded SVG-D1, 99% stenosis of SVG-OM treated w/ DES   Depression    Dyslipidemia    Erectile dysfunction    Essential hypertension    History of blood transfusion 2008   History of stroke    Hyperlipidemia    Myocardial infarction [facility])     Home Medications Prior to Admission medications   Medication Sig Start Date End Date Taking? Authorizing Provider  aspirin EC 81 MG tablet Take 1 tablet (81 mg total) by mouth daily. 02/12/17  Yes Jonelle Sidle, MD  diphenhydrAMINE (BENADRYL) 25 mg capsule Take 50 mg by mouth every 6 (six) hours as needed for sleep.   Yes [provider]  isosorbide mononitrate (IMDUR) 30 MG 24 hr tablet TAKE 1 TABLET BY MOUTH TWICE DAILY 03/23/22  Yes Jonelle Sidle, MD  MAGNESIUM OXIDE  PO Take 1 tablet by mouth daily.   Yes [provider]  nitroGLYCERIN (NITROSTAT) 0.4 MG SL tablet place 1 tablet UNDER THE TONGUE EVERY 5 MINUTES UP TO 3 times AS NEEDED FOR CHEST pain if no RELIEF AFTER 3rd DOSE proceed TO THE ED FOR an evaluation 09/28/22  Yes Jonelle Sidle, MD  ALPRAZolam Prudy Feeler) 1 MG tablet Take 1 tablet (1 mg total) by mouth at bedtime as needed for anxiety. Patient taking differently: Take 1 mg by mouth 2 (two) times daily as needed for anxiety. 11/01/16   Triplett, Tammy, PA-C  b complex vitamins tablet Take 1 tablet by mouth daily.    [provider]  Cholecalciferol (VITAMIN D3) 25 MCG (1000 UT) CAPS Take 2 capsules by mouth daily.    [provider]  co-enzyme Q-10 30 MG capsule Take 30 mg by mouth daily.    [provider]  FEROSUL 325 (65 Fe) MG tablet Take 325 mg by mouth daily. 05/23/21   [provider]  furosemide (LASIX) 20 MG tablet Take 20 mg by mouth daily as needed for fluid or edema.    [provider]  hydrochlorothiazide (HYDRODIURIL) 25 MG tablet TAKE 1 TABLET BY MOUTH EVERY DAY Patient taking differently: Take 25 mg by mouth 3 (three) times a week. 10/27/17   Jonelle Sidle, MD  losartan (COZAAR) 50 MG tablet Take 50 mg by mouth daily. 01/20/19   [provider]  multivitamin [facility]) per tablet Take 1 tablet by mouth daily.    [provider]  NEXLIZET 180-10 MG TABS Take 1 tablet by mouth daily. 07/23/22   [provider]  Omega 3 1000 MG CAPS Take 1 capsule by mouth daily.    [provider]  pantoprazole (PROTONIX) 40 MG tablet TAKE 1 TABLET BY MOUTH EVERY DAY 02/09/19   Jonelle Sidle, MD  potassium chloride SA (KLOR-CON M) 20 MEQ tablet TAKE 1 TABLET BY MOUTH TWICE DAILY 12/10/22   Jonelle Sidle, MD  Tiotropium Bromide-Olodaterol (STIOLTO RESPIMAT) 2.5-2.5 MCG/ACT AERS Inhale 1 puff into the lungs 2 (two) times a day.    [provider]   Zinc 50 MG CAPS Take 1 capsule by mouth daily.    [provider]      Allergies    Anoro ellipta [umeclidinium-vilanterol], Lipitor [atorvastatin calcium], Niaspan [niacin], Zocor [simvastatin], and Fenofibrate    Review of Systems   Review of Systems  Cardiovascular:  Positive for chest pain.    Physical Exam Updated Vital Signs BP 136/70   Pulse 64   Temp 97.9 F (36.6 C) (Oral)   Resp 20   Ht 5\' 10"  (1.778 m)   Wt 84 kg   SpO2 100%   BMI 26.57 kg/m  Physical Exam Vitals and nursing note reviewed.  Cardiovascular:     Rate and Rhythm: Normal rate. Rhythm irregular.  Chest:     Comments: Somewhat harsh breath sounds. Abdominal:     Tenderness: There is no abdominal tenderness.  Genitourinary:    Comments: Rectal exam did show some gross blood. Musculoskeletal:     Right lower leg: Edema present.     Left lower leg: Edema present.     Comments: Pitting edema bilateral lower extremities.  Skin:    Coloration: Skin is pale.  Neurological:     Mental Status: He is alert.     ED Results / Procedures / Treatments   Labs (all labs ordered are listed, but only abnormal results are displayed) Labs Reviewed  BASIC METABOLIC PANEL - Abnormal; Notable for the following components:      Result Value   Sodium 134 (*)    Glucose, Bld 135 (*)    BUN 27 (*)    Creatinine, Ser 1.36 (*)    Calcium 8.5 (*)    GFR, Estimated 53 (*)    All other components within normal limits  CBC - Abnormal; Notable for the following components:   RBC 2.27 (*)    Hemoglobin 6.1 (*)    HCT 19.7 (*)    RDW 15.8 (*)    All other components within normal limits  HEPATIC FUNCTION PANEL - Abnormal; Notable for the following components:   Total Protein 6.4 (*)    All other components within normal limits  BRAIN NATRIURETIC PEPTIDE - Abnormal; Notable for the following components:   B Natriuretic Peptide 239.0 (*)    All other components within normal limits  GLUCOSE, CAPILLARY -  Abnormal; Notable for the following components:   Glucose-Capillary 102 (*)    All other components within normal limits  TROPONIN I (HIGH SENSITIVITY) - Abnormal; Notable for the following components:   Troponin I (High Sensitivity) 36 (*)    All other components within normal limits  TROPONIN I (HIGH SENSITIVITY) - Abnormal; Notable for the following components:   Troponin  I (High Sensitivity) 134 (*)    All other components within normal limits  MRSA NEXT GEN BY PCR, NASAL  PROTIME-INR  TYPE AND SCREEN  ABO/RH  PREPARE RBC (CROSSMATCH)  PREPARE RBC (CROSSMATCH)    EKG EKG Interpretation Date/Time:  Sunday January 17 2023 10:16:37 EST Ventricular Rate:  83 PR Interval:    QRS Duration:  116 QT Interval:  407 QTC Calculation: 479 R Axis:   4  Text Interpretation: Atrial fibrillation Ventricular premature complex Nonspecific intraventricular conduction delay Repol abnrm, severe global ischemia (LM/MVD) Confirmed by Benjiman Core [phone]) on 01/17/2023 10:25:41 AM  Radiology DG Chest Port 1 View  Result Date: 01/17/2023 CLINICAL DATA:  Atrial fibrillation and chest pain EXAM: PORTABLE CHEST 1 VIEW COMPARISON:  Chest radiograph dated 04/19/2020 FINDINGS: Normal lung volumes. Severe bilateral interstitial opacities. Trace blunting of the bilateral costophrenic angles. No pneumothorax. Similar enlarged cardiomediastinal silhouette. Median sternotomy wires are nondisplaced. IMPRESSION: 1. Severe bilateral interstitial opacities, likely pulmonary edema. 2. Trace blunting of the bilateral costophrenic angles, which may represent trace pleural effusions. 3. Similar cardiomegaly. Electronically Signed   By: Agustin Cree M.D.   On: 01/17/2023 12:31    Procedures Procedures    Medications Ordered in ED Medications  furosemide (LASIX) injection 40 mg (has no administration in time range)  furosemide (LASIX) injection 40 mg (has no administration in time range)  losartan (COZAAR)  tablet 50 mg (50 mg Oral Given 01/17/23 1232)  Bempedoic Acid-Ezetimibe 180-10 MG TABS 1 tablet (1 tablet Oral Not Given 01/17/23 1416)  ALPRAZolam (XANAX) tablet 1 mg (has no administration in time range)  pantoprazole (PROTONIX) injection 40 mg (40 mg Intravenous Given 01/17/23 1231)  potassium chloride SA (KLOR-CON M) CR tablet 20 mEq (20 mEq Oral Given 01/17/23 1232)  arformoterol (BROVANA) nebulizer solution 15 mcg (15 mcg Nebulization Given 01/17/23 1232)    And  umeclidinium bromide (INCRUSE ELLIPTA) 62.5 MCG/ACT 1 puff (1 puff Inhalation Not Given 01/17/23 1316)  sodium chloride flush (NS) 0.9 % injection 3 mL (has no administration in time range)  sodium chloride flush (NS) 0.9 % injection 3 mL (has no administration in time range)  sodium chloride flush (NS) 0.9 % injection 3 mL (has no administration in time range)  0.9 %  sodium chloride infusion (has no administration in time range)  acetaminophen (TYLENOL) tablet 650 mg (has no administration in time range)    Or  acetaminophen (TYLENOL) suppository 650 mg (has no administration in time range)  traZODone (DESYREL) tablet 50 mg (has no administration in time range)  polyethylene glycol (MIRALAX / GLYCOLAX) packet 17 g (has no administration in time range)  bisacodyl (DULCOLAX) suppository 10 mg (has no administration in time range)  ondansetron (ZOFRAN) tablet 4 mg (has no administration in time range)    Or  ondansetron (ZOFRAN) injection 4 mg (has no administration in time range)  albuterol (PROVENTIL) (2.5 MG/3ML) 0.083% nebulizer solution 2.5 mg (has no administration in time range)  Chlorhexidine Gluconate Cloth 2 % PADS 6 each (6 each Topical Given 01/17/23 1221)  Oral care mouth rinse (has no administration in time range)  0.9 %  sodium chloride infusion (Manually program via Guardrails IV Fluids) (0 mLs Intravenous Stopped 01/17/23 1444)  0.9 %  sodium chloride infusion (Manually program via Guardrails IV Fluids) (  Intravenous New Bag/Given 01/17/23 1442)    ED Course/ Medical Decision Making/ A&P  Medical Decision Making Amount and/or Complexity of Data Reviewed Labs: ordered. Radiology: ordered.  Risk Prescription drug management. Decision regarding hospitalization.   Patient shortness of breath.  Has jaw pain and bilateral arm pain.  Does have nonspecific EKG changes potentially could be a global ischemia.  No relief with nitroglycerin.  However found to have hemoglobin of 6.1.  I think this is likely the cause of the cardiac symptoms.  Although does appear to have worsening edema on his legs.  BNP is elevated.  Rectal exam did show gross blood.  Discussed with Dr. Marletta Lor, who will see patient.  Will transfuse 2 units.  Will discuss with hospitalist for admission. I think likely cardiac symptoms are from the anemia.  Would not anticoagulate at this time for cardiac cause due to the GI bleed. CRITICAL CARE Performed by: Benjiman Core Total critical care time: 45 minutes Critical care time was exclusive of separately billable procedures and treating other patients. Critical care was necessary to treat or prevent imminent or life-threatening deterioration. Critical care was time spent personally by me on the following activities: development of treatment plan with patient and/or surrogate as well as nursing, discussions with consultants, evaluation of patient's response to treatment, examination of patient, obtaining history from patient or surrogate, ordering and performing treatments and interventions, ordering and review of laboratory studies, ordering and review of radiographic studies, pulse oximetry and re-evaluation of patient's condition.         Final Clinical Impression(s) / ED Diagnoses Final diagnoses:  Lower GI bleed  Acute blood loss anemia    Rx / DC Orders ED Discharge Orders     None         Benjiman Core, MD 01/17/23  [phone]

## 2023-01-17 NOTE — ED Notes (Signed)
Son Tonia Clowe [phone]

## 2023-01-17 NOTE — ED Triage Notes (Signed)
Pt to er room number 3 via ems, per ems pt is here for arm pain radiating up into his jaw, states that he was recently in his cardiologist office for his a fib and to talked about a defibrillator.  Per ems pt took 4 nitroglycerin with no relief.    Pt states that the nitroglycerin helped a little bit with his pain.  Pt denies chest pain, but states that he has back, arm and jaw pain.

## 2023-01-17 NOTE — Consult Note (Signed)
Consulting  Provider: Dr. Rubin Payor Primary Care Physician:  Benita Stabile, MD Primary Gastroenterologist: None  Reason for Consultation: Acute blood loss anemia, hematochezia  HPI:  William Elliott is a 82 y.o. male with a past medical history of CAD, s/p CABG and drug-eluting stent to SVG to OM in 2017, permanent A-fib, mixed hyperlipidemia, history of lower GI bleed, bradycardia, HTN, dyslipidemia, and stroke who presented to [facility], ER today with chief complaint of chest pain radiating to shoulder, dyspnea.  Also notes some tightness in his jaw.  Pain started yesterday.  Nitroglycerin did not help.  In the ER found to have hemoglobin 6.1, troponin 36, BNP 239.  Rectal exam revealed gross hematochezia.  Patient states he has had rectal bleeding, bright red blood.  Denies any clots.  No abdominal pain.  Does note some indigestion.  No dysphagia odynophagia.  Similar presentation to outside hospital 06/15/2021 for rectal bleeding and acute blood loss anemia.  CT angiogram abdomen pelvis with active bleed at the splenic flexure.  Also noted extensive diverticulosis.  Was recommended patient be admitted to [facility] for possible IR intervention.  While waiting for bed, patient decided to leave AGAINST MEDICAL ADVICE.  States his last colonoscopy was greater than 10 years ago.  No family history of colorectal malignancy.  Denies any chronic NSAID use.  States he had a Mallory-Weiss tear diagnosed greater than 20 years ago.  Denies any nausea vomiting.  Patient required supplemental oxygen during my interview and exam.  Past Medical History:  Diagnosis Date   Acute lower GI bleeding 2008   Anxiety    Asthma    Carotid artery disease (HCC)    Asymptomatic left carotid bruit   COPD (chronic obstructive pulmonary disease) (HCC)    Coronary artery disease    a. CABG - 1999 with LIMA-LAD, SVG-DIAG, SVG-OM, SVG-RPDA  b. Cath in setting of NSTEMI 03/18/2015: patent LIMA-LAD, occluded  SVG-RCA, occluded SVG-D1, 99% stenosis of SVG-OM treated w/ DES   Depression    Dyslipidemia    Erectile dysfunction    Essential hypertension    History of blood transfusion 2008   History of stroke    Hyperlipidemia    Myocardial infarction [facility])     Past Surgical History:  Procedure Laterality Date   CARDIAC CATHETERIZATION     CARDIAC CATHETERIZATION N/A 03/18/2015   Procedure: Left Heart Cath and Cors/Grafts Angiography;  Surgeon: Marykay Lex, MD;  Location: [facility] INVASIVE CV LAB;  Service: Cardiovascular;  Laterality: N/A;   CARDIAC CATHETERIZATION N/A 03/18/2015   Procedure: Coronary Stent Intervention;  Surgeon: Marykay Lex, MD;  Location: [facility] INVASIVE CV LAB;  Service: Cardiovascular;  Laterality: N/A;   CATARACT EXTRACTION W/ INTRAOCULAR LENS  IMPLANT, BILATERAL Bilateral    CORONARY ANGIOPLASTY     CORONARY ARTERY BYPASS GRAFT  1999   CORONARY STENT INTERVENTION N/A 04/22/2020   Procedure: CORONARY STENT INTERVENTION;  Surgeon: Lyn Records, MD;  Location: [facility] INVASIVE CV LAB;  Service: Cardiovascular;  Laterality: N/A;   FRACTURE SURGERY     HERNIA REPAIR     LEFT HEART CATH AND CORS/GRAFTS ANGIOGRAPHY N/A 04/22/2020   Procedure: LEFT HEART CATH AND CORS/GRAFTS ANGIOGRAPHY;  Surgeon: Lyn Records, MD;  Location: [facility] INVASIVE CV LAB;  Service: Cardiovascular;  Laterality: N/A;   TIBIA FRACTURE SURGERY Left [facility]; took bone out of my right hip   TONSILLECTOMY     UMBILICAL HERNIA REPAIR  ~ 2000  Prior to Admission medications   Medication Sig Start Date End Date Taking? Authorizing Provider  ALPRAZolam Prudy Feeler) 1 MG tablet Take 1 tablet (1 mg total) by mouth at bedtime as needed for anxiety. Patient taking differently: Take 1 mg by mouth 2 (two) times daily as needed for anxiety. 11/01/16   Triplett, Tammy, PA-C  aspirin EC 81 MG tablet Take 1 tablet (81 mg total) by mouth daily. 02/12/17   Jonelle Sidle, MD  b complex vitamins tablet Take 1  tablet by mouth daily.    [provider]  Cholecalciferol (VITAMIN D3) 25 MCG (1000 UT) CAPS Take 2 capsules by mouth daily.    [provider]  co-enzyme Q-10 30 MG capsule Take 30 mg by mouth daily.    [provider]  FEROSUL 325 (65 Fe) MG tablet Take 325 mg by mouth daily. 05/23/21   [provider]  furosemide (LASIX) 20 MG tablet Take 20 mg by mouth daily as needed for fluid or edema.    [provider]  hydrochlorothiazide (HYDRODIURIL) 25 MG tablet TAKE 1 TABLET BY MOUTH EVERY DAY 10/27/17   Jonelle Sidle, MD  isosorbide mononitrate (IMDUR) 30 MG 24 hr tablet TAKE 1 TABLET BY MOUTH TWICE DAILY 03/23/22   Jonelle Sidle, MD  losartan (COZAAR) 50 MG tablet Take 50 mg by mouth daily. 01/20/19   [provider]  MAGNESIUM OXIDE PO Take 1 tablet by mouth daily.    [provider]  multivitamin [facility]) per tablet Take 1 tablet by mouth daily.    [provider]  NEXLIZET 180-10 MG TABS Take 1 tablet by mouth daily. 07/23/22   [provider]  nitroGLYCERIN (NITROSTAT) 0.4 MG SL tablet place 1 tablet UNDER THE TONGUE EVERY 5 MINUTES UP TO 3 times AS NEEDED FOR CHEST pain if no RELIEF AFTER 3rd DOSE proceed TO THE ED FOR an evaluation 09/28/22   Jonelle Sidle, MD  Omega 3 1000 MG CAPS Take 1 capsule by mouth daily.    [provider]  pantoprazole (PROTONIX) 40 MG tablet TAKE 1 TABLET BY MOUTH EVERY DAY 02/09/19   Jonelle Sidle, MD  potassium chloride SA (KLOR-CON M) 20 MEQ tablet TAKE 1 TABLET BY MOUTH TWICE DAILY 12/10/22   Jonelle Sidle, MD  Tiotropium Bromide-Olodaterol (STIOLTO RESPIMAT) 2.5-2.5 MCG/ACT AERS Inhale 1 puff into the lungs 2 (two) times a day.    [provider]  Zinc 50 MG CAPS Take 1 capsule by mouth daily.    [provider]    Current Facility-Administered Medications  Medication Dose Route Frequency Provider Last Rate Last Admin   0.9 %   sodium chloride infusion (Manually program via Guardrails IV Fluids)   Intravenous Once Emokpae, Courage, MD       0.9 %  sodium chloride infusion (Manually program via Guardrails IV Fluids)   Intravenous Once Emokpae, Courage, MD       0.9 %  sodium chloride infusion   Intravenous PRN Mariea Clonts, Courage, MD       acetaminophen (TYLENOL) tablet 650 mg  650 mg Oral Q6H PRN Emokpae, Courage, MD       Or   acetaminophen (TYLENOL) suppository 650 mg  650 mg Rectal Q6H PRN Emokpae, Courage, MD       albuterol (PROVENTIL) (2.5 MG/3ML) 0.083% nebulizer solution 2.5 mg  2.5 mg Nebulization Q2H PRN Emokpae, Courage, MD       ALPRAZolam Prudy Feeler) tablet 1 mg  1  mg Oral BID PRN Shon Hale, MD       arformoterol (BROVANA) nebulizer solution 15 mcg  15 mcg Nebulization BID Emokpae, Courage, MD       And   umeclidinium bromide (INCRUSE ELLIPTA) 62.5 MCG/ACT 1 puff  1 puff Inhalation Daily Emokpae, Courage, MD       Bempedoic Acid-Ezetimibe 180-10 MG TABS 1 tablet  1 tablet Oral Daily Emokpae, Courage, MD       bisacodyl (DULCOLAX) suppository 10 mg  10 mg Rectal Daily PRN Emokpae, Courage, MD       Chlorhexidine Gluconate Cloth 2 % PADS 6 each  6 each Topical Daily Emokpae, Courage, MD       furosemide (LASIX) injection 40 mg  40 mg Intravenous Once Shon Hale, MD       [START ON 01/18/2023] furosemide (LASIX) injection 40 mg  40 mg Intravenous Daily Emokpae, Courage, MD       losartan (COZAAR) tablet 50 mg  50 mg Oral Daily Emokpae, Courage, MD       ondansetron (ZOFRAN) tablet 4 mg  4 mg Oral Q6H PRN Emokpae, Courage, MD       Or   ondansetron (ZOFRAN) injection 4 mg  4 mg Intravenous Q6H PRN Emokpae, Courage, MD       pantoprazole (PROTONIX) injection 40 mg  40 mg Intravenous Q12H Emokpae, Courage, MD       polyethylene glycol (MIRALAX / GLYCOLAX) packet 17 g  17 g Oral Daily PRN Emokpae, Courage, MD       potassium chloride SA (KLOR-CON M) CR tablet 20 mEq  20 mEq Oral BID Emokpae, Courage, MD        sodium chloride flush (NS) 0.9 % injection 3 mL  3 mL Intravenous Q12H Emokpae, Courage, MD       sodium chloride flush (NS) 0.9 % injection 3 mL  3 mL Intravenous Q12H Emokpae, Courage, MD       sodium chloride flush (NS) 0.9 % injection 3 mL  3 mL Intravenous PRN Emokpae, Courage, MD       traZODone (DESYREL) tablet 50 mg  50 mg Oral QHS PRN Emokpae, Courage, MD       Current Outpatient Medications  Medication Sig Dispense Refill   ALPRAZolam (XANAX) 1 MG tablet Take 1 tablet (1 mg total) by mouth at bedtime as needed for anxiety. (Patient taking differently: Take 1 mg by mouth 2 (two) times daily as needed for anxiety.) 10 tablet 0   aspirin EC 81 MG tablet Take 1 tablet (81 mg total) by mouth daily. 90 tablet 3   b complex vitamins tablet Take 1 tablet by mouth daily.     Cholecalciferol (VITAMIN D3) 25 MCG (1000 UT) CAPS Take 2 capsules by mouth daily.     co-enzyme Q-10 30 MG capsule Take 30 mg by mouth daily.     FEROSUL 325 (65 Fe) MG tablet Take 325 mg by mouth daily.     furosemide (LASIX) 20 MG tablet Take 20 mg by mouth daily as needed for fluid or edema.     hydrochlorothiazide (HYDRODIURIL) 25 MG tablet TAKE 1 TABLET BY MOUTH EVERY DAY 90 tablet 1   isosorbide mononitrate (IMDUR) 30 MG 24 hr tablet TAKE 1 TABLET BY MOUTH TWICE DAILY 180 tablet 3   losartan (COZAAR) 50 MG tablet Take 50 mg by mouth daily.     MAGNESIUM OXIDE PO Take 1 tablet by mouth daily.     multivitamin (THERAGRAN) per  tablet Take 1 tablet by mouth daily.     NEXLIZET 180-10 MG TABS Take 1 tablet by mouth daily.     nitroGLYCERIN (NITROSTAT) 0.4 MG SL tablet place 1 tablet UNDER THE TONGUE EVERY 5 MINUTES UP TO 3 times AS NEEDED FOR CHEST pain if no RELIEF AFTER 3rd DOSE proceed TO THE ED FOR an evaluation 25 tablet 3   Omega 3 1000 MG CAPS Take 1 capsule by mouth daily.     pantoprazole (PROTONIX) 40 MG tablet TAKE 1 TABLET BY MOUTH EVERY DAY 90 tablet 1   potassium chloride SA (KLOR-CON M) 20 MEQ tablet  TAKE 1 TABLET BY MOUTH TWICE DAILY 180 tablet 0   Tiotropium Bromide-Olodaterol (STIOLTO RESPIMAT) 2.5-2.5 MCG/ACT AERS Inhale 1 puff into the lungs 2 (two) times a day.     Zinc 50 MG CAPS Take 1 capsule by mouth daily.      Allergies as of 01/17/2023 - Review Complete 01/17/2023  Allergen Reaction Noted   Anoro ellipta [umeclidinium-vilanterol] Other (See Comments) 03/17/2015   Fenofibrate Other (See Comments) 07/03/2010   Lipitor [atorvastatin calcium] Other (See Comments) 06/27/2010   Niaspan [niacin] Other (See Comments) 07/03/2010   Zocor [simvastatin] Other (See Comments) 07/03/2010    Family History  Problem Relation Age of Onset   Heart disease Father    Heart attack Father    Hypertension Mother    Stroke Mother     Social History   Socioeconomic History   Marital status: Married    Spouse name: Not on file   Number of children: Not on file   Years of education: Not on file   Highest education level: Not on file  Occupational History   Occupation: Welder x 10 yrs   Tobacco Use   Smoking status: Former    Types: Cigars    Quit date: 03/02/1997    Years since quitting: 25.8    Passive exposure: Never   Smokeless tobacco: Never   Tobacco comments:    smoked cigars x 10 yrs, never smoked cigarettes  Vaping Use   Vaping status: Never Used  Substance and Sexual Activity   Alcohol use: No    Alcohol/week: 0.0 standard drinks of alcohol    Comment: "drank some when I was young; quit in the 1990s"   Drug use: No   Sexual activity: Not Currently  Other Topics Concern   Not on file  Social History Narrative   Not on file   Social Determinants of Health   Financial Resource Strain: Not on file  Food Insecurity: No Food Insecurity (01/17/2023)   Hunger Vital Sign    Worried About Running Out of Food in the Last Year: Never true    Ran Out of Food in the Last Year: Never true  Transportation Needs: No Transportation Needs (01/17/2023)   PRAPARE - Therapist, art (Medical): No    Lack of Transportation (Non-Medical): No  Physical Activity: Not on file  Stress: Not on file  Social Connections: Not on file  Intimate Partner Violence: Not At Risk (01/17/2023)   Humiliation, Afraid, Rape, and Kick questionnaire    Fear of Current or Ex-Partner: No    Emotionally Abused: No    Physically Abused: No    Sexually Abused: No    Review of Systems: General: Negative for anorexia, weight loss, fever, chills, fatigue, weakness. Eyes: Negative for vision changes.  ENT: Negative for hoarseness, difficulty swallowing , nasal congestion. CV: Positive for  chest pain, and worsening peripheral edema peripheral edema.  Respiratory: Positive for dyspnea, cough, sputum, wheezing.  GI: See history of present illness. GU:  Negative for dysuria, hematuria, urinary incontinence, urinary frequency, nocturnal urination.  MS: Negative for joint pain, low back pain.  Derm: Negative for rash or itching.  Neuro: Negative for weakness, abnormal sensation, seizure, frequent headaches, memory loss, confusion.  Psych: Negative for anxiety, depression Endo: Negative for unusual weight change.  Heme: Negative for bruising or bleeding. Allergy: Negative for rash or hives.  Physical Exam: Vital signs in last 24 hours: Temp:  [97.8 F (36.6 C)-98 F (36.7 C)] 97.8 F (36.6 C) (11/17 1152) Pulse Rate:  [71-80] 74 (11/17 1152) Resp:  [18-23] 20 (11/17 1152) BP: (108-151)/(29-80) 150/80 (11/17 1152) SpO2:  [94 %-99 %] 99 % (11/17 1152) Weight:  [90.7 kg] 90.7 kg (11/17 0958)   General:   Alert,  Well-developed, well-nourished, pleasant and cooperative in NAD Head:  Normocephalic and atraumatic. Eyes:  Sclera clear, no icterus.   Conjunctiva pink. Ears:  Normal auditory acuity. Nose:  No deformity, discharge,  or lesions. Mouth:  No deformity or lesions, dentition normal. Neck:  Supple; no masses or thyromegaly. Lungs:  Clear throughout to  auscultation.   No wheezes, crackles, or rhonchi. No acute distress. Heart:  Regular rate and rhythm; no murmurs, clicks, rubs,  or gallops. Abdomen:  Soft, nontender and nondistended. No masses, hepatosplenomegaly or hernias noted. Normal bowel sounds, without guarding, and without rebound.   Msk:  Symmetrical without gross deformities. Normal posture. Pulses:  Normal pulses noted. Extremities: Edema bilateral lower extremities Neurologic:  Alert and  oriented x4;  grossly normal neurologically. Skin:  Intact without significant lesions or rashes. Cervical Nodes:  No significant cervical adenopathy. Psych:  Alert and cooperative. Normal mood and affect.  Intake/Output from previous day: No intake/output data recorded. Intake/Output this shift: No intake/output data recorded.  Lab Results: Recent Labs    01/17/23 1001  WBC 8.4  HGB 6.1*  HCT 19.7*  PLT 279   BMET Recent Labs    01/17/23 1001  NA 134*  K 3.9  CL 100  CO2 23  GLUCOSE 135*  BUN 27*  CREATININE 1.36*  CALCIUM 8.5*   LFT Recent Labs    01/17/23 1007  PROT 6.4*  ALBUMIN 3.7  AST 20  ALT 14  ALKPHOS 47  BILITOT 0.9  BILIDIR 0.2  IBILI 0.7   PT/INR Recent Labs    01/17/23 1029  LABPROT 15.2  INR 1.2   Hepatitis Panel No results for input(s): "HEPBSAG", "HCVAB", "HEPAIGM", "HEPBIGM" in the last 72 hours. C-Diff No results for input(s): "CDIFFTOX" in the last 72 hours.  Studies/Results: No results found.  Assessment: *Acute GI bleed-hematochezia *Acute blood loss anemia *Diverticulosis of the colon  Plan: Etiology of patient's bleeding unclear, differential includes hemorrhoidal, diverticular, AVM, polyp, malignancy, or other.  Agree with PRBC blood transfusion.  Continue monitor H&H.  We will tentatively plan on endoscopic evaluation to further evaluate once clinically improved from a respiratory, cardiac standpoint.  Okay to place on clear liquid diet.  GI to continue to  follow  Hennie Duos. Marletta Lor, D.O. Gastroenterology and Hepatology [facility]    LOS: 0 days     01/17/2023, 12:07 PM

## 2023-01-17 NOTE — Plan of Care (Signed)

## 2023-01-18 ENCOUNTER — Inpatient Hospital Stay (HOSPITAL_COMMUNITY): Payer: 59

## 2023-01-18 ENCOUNTER — Other Ambulatory Visit (HOSPITAL_COMMUNITY): Payer: Self-pay | Admitting: *Deleted

## 2023-01-18 DIAGNOSIS — I25709 Atherosclerosis of coronary artery bypass graft(s), unspecified, with unspecified angina pectoris: Secondary | ICD-10-CM | POA: Diagnosis not present

## 2023-01-18 DIAGNOSIS — D62 Acute posthemorrhagic anemia: Secondary | ICD-10-CM | POA: Diagnosis not present

## 2023-01-18 DIAGNOSIS — Z789 Other specified health status: Secondary | ICD-10-CM

## 2023-01-18 DIAGNOSIS — I5083 High output heart failure: Secondary | ICD-10-CM | POA: Diagnosis not present

## 2023-01-18 DIAGNOSIS — R7989 Other specified abnormal findings of blood chemistry: Secondary | ICD-10-CM | POA: Diagnosis not present

## 2023-01-18 DIAGNOSIS — E7849 Other hyperlipidemia: Secondary | ICD-10-CM

## 2023-01-18 DIAGNOSIS — R9431 Abnormal electrocardiogram [ECG] [EKG]: Secondary | ICD-10-CM

## 2023-01-18 DIAGNOSIS — I214 Non-ST elevation (NSTEMI) myocardial infarction: Secondary | ICD-10-CM | POA: Diagnosis not present

## 2023-01-18 DIAGNOSIS — K922 Gastrointestinal hemorrhage, unspecified: Secondary | ICD-10-CM | POA: Diagnosis not present

## 2023-01-18 DIAGNOSIS — I4821 Permanent atrial fibrillation: Secondary | ICD-10-CM

## 2023-01-18 LAB — HEMOGLOBIN AND HEMATOCRIT, BLOOD
HCT: 26 % — ABNORMAL LOW (ref 39.0–52.0)
Hemoglobin: 8.6 g/dL — ABNORMAL LOW (ref 13.0–17.0)

## 2023-01-18 LAB — ECHOCARDIOGRAM COMPLETE
AR max vel: 1.59 cm2
AV Area VTI: 1.55 cm2
AV Area mean vel: 1.71 cm2
AV Mean grad: 8 mm[Hg]
AV Peak grad: 17.8 mm[Hg]
Ao pk vel: 2.11 m/s
Area-P 1/2: 4.49 cm2
Est EF: 45
Height: 70 in
MV M vel: 4.81 m/s
MV Peak grad: 92.5 mm[Hg]
Radius: 0.3 cm
S' Lateral: 3.6 cm
Weight: 2917.13 [oz_av]

## 2023-01-18 LAB — BASIC METABOLIC PANEL
Anion gap: 10 (ref 5–15)
BUN: 23 mg/dL (ref 8–23)
CO2: 23 mmol/L (ref 22–32)
Calcium: 8.7 mg/dL — ABNORMAL LOW (ref 8.9–10.3)
Chloride: 102 mmol/L (ref 98–111)
Creatinine, Ser: 1.16 mg/dL (ref 0.61–1.24)
GFR, Estimated: >60 mL/min (ref >60)
Glucose, Bld: 100 mg/dL — ABNORMAL HIGH (ref 70–99)
Potassium: 3.7 mmol/L (ref 3.5–5.1)
Sodium: 135 mmol/L (ref 135–145)

## 2023-01-18 LAB — CBC
HCT: 22.8 % — ABNORMAL LOW (ref 39.0–52.0)
Hemoglobin: 7.2 g/dL — ABNORMAL LOW (ref 13.0–17.0)
MCH: 26.9 pg (ref 26.0–34.0)
MCHC: 31.6 g/dL (ref 30.0–36.0)
MCV: 85.1 fL (ref 80.0–100.0)
Platelets: 230 10*3/uL (ref 150–400)
RBC: 2.68 MIL/uL — ABNORMAL LOW (ref 4.22–5.81)
RDW: 15.8 % — ABNORMAL HIGH (ref 11.5–15.5)
WBC: 8.7 10*3/uL (ref 4.0–10.5)
nRBC: 0 % (ref 0.0–0.2)

## 2023-01-18 LAB — GLUCOSE, CAPILLARY
Glucose-Capillary: 113 mg/dL — ABNORMAL HIGH (ref 70–99)
Glucose-Capillary: 117 mg/dL — ABNORMAL HIGH (ref 70–99)
Glucose-Capillary: 120 mg/dL — ABNORMAL HIGH (ref 70–99)
Glucose-Capillary: 93 mg/dL (ref 70–99)

## 2023-01-18 LAB — PREPARE RBC (CROSSMATCH)

## 2023-01-18 LAB — TROPONIN I (HIGH SENSITIVITY): Troponin I (High Sensitivity): 5346 ng/L (ref <18)

## 2023-01-18 MED ORDER — SODIUM CHLORIDE 0.9% IV SOLUTION: Freq: Once | INTRAVENOUS | Status: AC

## 2023-01-18 MED ORDER — FUROSEMIDE 10 MG/ML IJ SOLN
40.0000 mg | Freq: Once | INTRAMUSCULAR | Status: AC
  Administered 2023-01-18: 40 mg via INTRAVENOUS
  Filled 2023-01-18: qty 4

## 2023-01-18 NOTE — Progress Notes (Addendum)
   01/18/23 1209  TOC Brief Assessment  Insurance and Status Reviewed  Patient has primary care physician Yes  Home environment has been reviewed Home with Spouse  Prior level of function: Independent  Prior/Current Home Services No current home services  Social Determinants of Health Reivew SDOH reviewed no interventions necessary  Readmission risk has been reviewed Yes  Transition of care needs no transition of care needs at this time   Palliative and Cardiology consulted  Transition of Care Department [facility]) has reviewed patient and no TOC needs have been identified at this time. We will continue to monitor patient advancement through interdisciplinary progression rounds. If new patient transition needs arise, please place a TOC consult.

## 2023-01-18 NOTE — Progress Notes (Signed)
Son, Yitzchok Ovitt, called checking on patient. Gave son update and called him on patient's phone. Patient is currently talking to son on phone now.

## 2023-01-18 NOTE — Progress Notes (Signed)
   01/18/23 0650  Provider Notification  Provider Name/Title Dr. Thomes Dinning  Date Provider Notified 01/18/23  Time Provider Notified [phone]  Method of Notification Page  Notification Reason Critical Result  Test performed and critical result Troponin 5,346  Date Critical Result Received 01/18/23  Time Critical Result Received [phone]

## 2023-01-18 NOTE — Plan of Care (Signed)
  Problem: Education: Goal: Knowledge of General Education information will improve Description: Including pain rating scale, medication(s)/side effects and non-pharmacologic comfort measures Outcome: Not Progressing   Problem: Health Behavior/Discharge Planning: Goal: Ability to manage health-related needs will improve Outcome: Progressing   Problem: Clinical Measurements: Goal: Ability to maintain clinical measurements within normal limits will improve Outcome: Progressing Goal: Will remain free from infection Outcome: Progressing Goal: Diagnostic test results will improve Outcome: Progressing Goal: Respiratory complications will improve Outcome: Progressing Goal: Cardiovascular complication will be avoided Outcome: Progressing   Problem: Activity: Goal: Risk for activity intolerance will decrease Outcome: Progressing   Problem: Nutrition: Goal: Adequate nutrition will be maintained Outcome: Progressing   Problem: Coping: Goal: Level of anxiety will decrease Outcome: Progressing   Problem: Elimination: Goal: Will not experience complications related to bowel motility Outcome: Progressing Goal: Will not experience complications related to urinary retention Outcome: Progressing   Problem: Pain Management: Goal: General experience of comfort will improve Outcome: Progressing   Problem: Safety: Goal: Ability to remain free from injury will improve Outcome: Progressing   Problem: Skin Integrity: Goal: Risk for impaired skin integrity will decrease Outcome: Progressing

## 2023-01-18 NOTE — Progress Notes (Signed)
*  PRELIMINARY RESULTS* Echocardiogram 2D Echocardiogram has been performed.  Stacey Drain 01/18/2023, 3:31 PM

## 2023-01-18 NOTE — Progress Notes (Signed)
   01/18/23 1110  Spiritual Encounters  Type of Visit Initial  Care provided to: Patient  Referral source Other (comment) (Spiritual consult for AD and prayer)  Reason for visit Advance directives  OnCall Visit No   Chaplain responded to a spiritual consult for prayer and information about advanced directives. As we visited the patient, [name] shared that his wife would be his agent and he did not think he needed to complete an Advanced Directive.. In his situation I agreed but I advised him that if he does change his mind and would like to complete the AD for added information for the medical team to let his nurse know and a chaplain would respond.  William Elliott also asked that I keep him in prayer, which I will.   Valerie Roys [facility]  [phone]

## 2023-01-18 NOTE — Consult Note (Signed)
Cardiology Consultation   Patient ID: William Elliott MRN: [id number]; DOB: 1940/04/17  Admit date: 01/17/2023 Date of Consult: 01/18/2023  PCP:  Benita Stabile, MD   [facility] Providers Cardiologist:  Nona Dell, MD        Patient Profile:   William Elliott is a 82 y.o. male with a hx of   with a hx of CAD, s/p CABG and drug-eluting stent to SVG to OM in 2017, drug-eluting stent to SVG to OM in 2022 with occlusion of SVG to PDA and SVG to diagonal, LIMA to LAD was patent.permanent A-fib, mixed hyperlipidemia, history of lower GI bleed, bradycardia, HTN, dyslipidemia, and past history of strokewho is being seen 01/18/2023 for the evaluation of elevated troponins at the request of Dr. Mariea Clonts.  History of Present Illness:   [name] with above PMHx. Metoprolol stopped in past due to bradycardia. Was seen in office 01/12/23 and doing well.permanent Afib in 40's-asymptomatic, has declined AC.   Patient admitted yesterday chest pain and found to have an acute GI bleed, Hgb 6.1 Patient says he had severe jaw, shoulder and back pain unrelieved with 3 NTG so came to ED. Doesn't know what relieved the pain. Asking to go home.Troponin 36, 136 to 5346. CXR pulm edema, BNP 239, received lasix between transfusions. Currently comfortable, but complaining of congestion.no further chest pain/jaw/arm pain.   Past Medical History:  Diagnosis Date   Acute lower GI bleeding 2008   Anxiety    Asthma    Carotid artery disease (HCC)    Asymptomatic left carotid bruit   COPD (chronic obstructive pulmonary disease) (HCC)    Coronary artery disease    a. CABG - 1999 with LIMA-LAD, SVG-DIAG, SVG-OM, SVG-RPDA  b. Cath in setting of NSTEMI 03/18/2015: patent LIMA-LAD, occluded SVG-RCA, occluded SVG-D1, 99% stenosis of SVG-OM treated w/ DES   Depression    Dyslipidemia    Erectile dysfunction    Essential hypertension    History of blood transfusion 2008   History of stroke     Hyperlipidemia    Myocardial infarction [facility])     Past Surgical History:  Procedure Laterality Date   CARDIAC CATHETERIZATION     CARDIAC CATHETERIZATION N/A 03/18/2015   Procedure: Left Heart Cath and Cors/Grafts Angiography;  Surgeon: Marykay Lex, MD;  Location: [facility] INVASIVE CV LAB;  Service: Cardiovascular;  Laterality: N/A;   CARDIAC CATHETERIZATION N/A 03/18/2015   Procedure: Coronary Stent Intervention;  Surgeon: Marykay Lex, MD;  Location[facility] INVASIVE CV LAB;  Service: Cardiovascular;  Laterality: N/A;   CATARACT EXTRACTION W/ INTRAOCULAR LENS  IMPLANT, BILATERAL Bilateral    CORONARY ANGIOPLASTY     CORONARY ARTERY BYPASS GRAFT  1999   CORONARY STENT INTERVENTION N/A 04/22/2020   Procedure: CORONARY STENT INTERVENTION;  Surgeon: Lyn Records, MD;  Location: [facility] INVASIVE CV LAB;  Service: Cardiovascular;  Laterality: N/A;   FRACTURE SURGERY     HERNIA REPAIR     LEFT HEART CATH AND CORS/GRAFTS ANGIOGRAPHY N/A 04/22/2020   Procedure: LEFT HEART CATH AND CORS/GRAFTS ANGIOGRAPHY;  Surgeon: Lyn Records, MD;  Location: [facility] INVASIVE CV LAB;  Service: Cardiovascular;  Laterality: N/A;   TIBIA FRACTURE SURGERY [facility]; took bone out of my right hip   TONSILLECTOMY     UMBILICAL HERNIA REPAIR  ~ 2000     Home Medications:  Prior to Admission medications   Medication Sig Start Date End Date Taking? Authorizing  Provider  albuterol (VENTOLIN HFA) 108 (90 Base) MCG/ACT inhaler Inhale 2 puffs into the lungs every 6 (six) hours as needed for wheezing or shortness of breath.   Yes [provider]  ALPRAZolam Prudy Feeler) 1 MG tablet Take 1 tablet (1 mg total) by mouth at bedtime as needed for anxiety. Patient taking differently: Take 1 mg by mouth 2 (two) times daily as needed for anxiety. 11/01/16  Yes Triplett, Tammy, PA-C  ascorbic acid (VITAMIN C) 500 MG tablet Take 500 mg by mouth daily.   Yes [provider]  aspirin EC 81 MG tablet Take 1 tablet  (81 mg total) by mouth daily. 02/12/17  Yes Jonelle Sidle, MD  b complex vitamins tablet Take 1 tablet by mouth daily.   Yes [provider]  calcium carbonate (TUMS - DOSED IN MG ELEMENTAL CALCIUM) 500 MG chewable tablet Chew 1 tablet by mouth daily as needed for indigestion or heartburn.   Yes [provider]  Cholecalciferol (VITAMIN D3) 25 MCG (1000 UT) CAPS Take 1 capsule by mouth in the morning and at bedtime.   Yes [provider]  diphenhydrAMINE (BENADRYL) 25 mg capsule Take 50 mg by mouth every 6 (six) hours as needed for sleep.   Yes [provider]  FEROSUL 325 (65 Fe) MG tablet Take 325 mg by mouth daily. 05/23/21  Yes [provider]  furosemide (LASIX) 20 MG tablet Take 20 mg by mouth daily.   Yes [provider]  hydrochlorothiazide (HYDRODIURIL) 25 MG tablet TAKE 1 TABLET BY MOUTH EVERY DAY Patient taking differently: Take 25 mg by mouth 3 (three) times a week. 10/27/17  Yes Jonelle Sidle, MD  isosorbide mononitrate (IMDUR) 30 MG 24 hr tablet TAKE 1 TABLET BY MOUTH TWICE DAILY 03/23/22  Yes Jonelle Sidle, MD  losartan (COZAAR) 50 MG tablet Take 50 mg by mouth daily. 01/20/19  Yes [provider]  MAGNESIUM OXIDE PO Take 1 tablet by mouth daily.   Yes [provider]  multivitamin [facility]) per tablet Take 1 tablet by mouth daily.   Yes [provider]  NEXLIZET 180-10 MG TABS Take 1 tablet by mouth daily. 07/23/22  Yes [provider]  nitroGLYCERIN (NITROSTAT) 0.4 MG SL tablet place 1 tablet UNDER THE TONGUE EVERY 5 MINUTES UP TO 3 times AS NEEDED FOR CHEST pain if no RELIEF AFTER 3rd DOSE proceed TO THE ED FOR an evaluation 09/28/22  Yes Jonelle Sidle, MD  Omega 3 1000 MG CAPS Take 1 capsule by mouth daily.   Yes [provider]  pantoprazole (PROTONIX) 40 MG tablet TAKE 1 TABLET BY MOUTH EVERY DAY 02/09/19  Yes Jonelle Sidle, MD  potassium chloride SA (KLOR-CON  M) 20 MEQ tablet TAKE 1 TABLET BY MOUTH TWICE DAILY 12/10/22  Yes Jonelle Sidle, MD  Tiotropium Bromide-Olodaterol (STIOLTO RESPIMAT) 2.5-2.5 MCG/ACT AERS Inhale 1 puff into the lungs 2 (two) times a day.   Yes [provider]  Zinc 50 MG CAPS Take 1 capsule by mouth daily.   Yes [provider]    Inpatient Medications: Scheduled Meds:  arformoterol  15 mcg Nebulization BID   And   umeclidinium bromide  1 puff Inhalation Daily   Chlorhexidine Gluconate Cloth  6 each Topical Daily   feeding supplement  1 Container Oral TID BM   furosemide  40 mg Intravenous Daily   isosorbide mononitrate  30 mg Oral Daily   losartan  50 mg Oral Daily  metoprolol tartrate  12.5 mg Oral BID   pantoprazole (PROTONIX) IV  40 mg Intravenous Q12H   potassium chloride SA  20 mEq Oral BID   sodium chloride flush  3 mL Intravenous Q12H   sodium chloride flush  3 mL Intravenous Q12H   Continuous Infusions:  sodium chloride     PRN Meds: sodium chloride, acetaminophen **OR** acetaminophen, albuterol, ALPRAZolam, bisacodyl, ondansetron **OR** ondansetron (ZOFRAN) IV, mouth rinse, polyethylene glycol, sodium chloride flush, traZODone  Allergies:    Allergies  Allergen Reactions   Anoro Ellipta [Umeclidinium-Vilanterol] Other (See Comments)    Cardiac problems   Lipitor [Atorvastatin Calcium] Other (See Comments)    Not known    Niaspan [Niacin] Other (See Comments)    Not known   Zocor [Simvastatin] Other (See Comments)    Not known   Fenofibrate Other (See Comments)    No energy    Social History:   Social History   Socioeconomic History   Marital status: Married    Spouse name: Not on file   Number of children: Not on file   Years of education: Not on file   Highest education level: Not on file  Occupational History   Occupation: Welder x 10 yrs   Tobacco Use   Smoking status: Former    Types: Cigars    Quit date: 03/02/1997    Years since quitting: 25.8     Passive exposure: Never   Smokeless tobacco: Never   Tobacco comments:    smoked cigars x 10 yrs, never smoked cigarettes  Vaping Use   Vaping status: Never Used  Substance and Sexual Activity   Alcohol use: No    Alcohol/week: 0.0 standard drinks of alcohol    Comment: "drank some when I was young; quit in the 1990s"   Drug use: No   Sexual activity: Not Currently  Other Topics Concern   Not on file  Social History Narrative   Not on file   Social Determinants of Health   Financial Resource Strain: Not on file  Food Insecurity: No Food Insecurity (01/17/2023)   Hunger Vital Sign    Worried About Running Out of Food in the Last Year: Never true    Ran Out of Food in the Last Year: Never true  Transportation Needs: No Transportation Needs (01/17/2023)   PRAPARE - Administrator, Civil Service (Medical): No    Lack of Transportation (Non-Medical): No  Physical Activity: Not on file  Stress: Not on file  Social Connections: Not on file  Intimate Partner Violence: Not At Risk (01/17/2023)   Humiliation, Afraid, Rape, and Kick questionnaire    Fear of Current or Ex-Partner: No    Emotionally Abused: No    Physically Abused: No    Sexually Abused: No    Family History:     Family History  Problem Relation Age of Onset   Heart disease Father    Heart attack Father    Hypertension Mother    Stroke Mother      ROS:  Please see the history of present illness.  Review of Systems  Constitutional: Negative.  HENT: Negative.    Cardiovascular: Negative.   Respiratory: Negative.    Endocrine: Negative.   Hematologic/Lymphatic: Negative.   Musculoskeletal: Negative.   Gastrointestinal: Negative.   Genitourinary: Negative.   Neurological: Negative.     All other ROS reviewed and negative.     Physical Exam/Data:   Vitals:   01/18/23 0400 01/18/23 0500  01/18/23 0600 01/18/23 0748  BP: (!) 130/55 136/77 (!) 94/52   Pulse: (!) 55 64 70   Resp: 19 18 20     Temp: 98 F (36.7 C)     TempSrc: Oral     SpO2: 92% 91% 95% 97%  Weight:   82.7 kg   Height:        Intake/Output Summary (Last 24 hours) at 01/18/2023 0831 Last data filed at 01/18/2023 0500 Gross per 24 hour  Intake 807.16 ml  Output 2300 ml  Net -1492.84 ml      01/18/2023    6:00 AM 01/17/2023   12:23 PM 01/17/2023    9:58 AM  Last 3 Weights  Weight (lbs) 182 lb 5.1 oz 185 lb 3 oz 200 lb  Weight (kg) 82.7 kg 84 kg 90.719 kg     Body mass index is 26.16 kg/m.  General:  Well nourished, well developed, in no acute distress  HEENT: normal Neck: increased JVD Vascular: No carotid bruits; Distal pulses 2+ bilaterally Cardiac:  normal S1, S2; RRR;2/6 systolic murmur LSB Lungs:  rales at bases Abd: soft, nontender, no hepatomegaly  Ext: plus 2 edema Musculoskeletal:  No deformities, BUE and BLE strength normal and equal Skin: warm and dry  Neuro:  CNs 2-12 intact, no focal abnormalities noted Psych:  Normal affect   EKG:  The EKG was personally reviewed and demonstrates:  Afib with ST elevation in AVR and ST depression inflat leads Telemetry:  Telemetry was personally reviewed and demonstrates:  Afib with NSVT, PVC's  Relevant CV Studies:   Carotid duplex 11/2022:  Summary:  Right Carotid: Velocities in the right ICA are consistent with a 1-39%  stenosis. Non-hemodynamically significant plaque <50% noted in the  CCA. The ECA appears <50% stenosed.   Left Carotid: Velocities in the left ICA are consistent with a 1-39%  stenosis. Non-hemodynamically significant plaque <50% noted in the  CCA. The ECA appears <50% stenosed.   Vertebrals:  Bilateral vertebral arteries demonstrate antegrade flow.  Subclavians: Normal flow hemodynamics were seen in bilateral subclavian arteries.   Cardiac monitor 10/2022:  ZIO monitor reviewed.  6 days, 3 hours analyzed.   Predominant rhythm is atrial fibrillation with heart rate ranging from 30 bpm up to 75 bpm and average heart  rate 41 bpm.  In the right clinical context, could be consistent with chronotropic incompetence and symptomatic bradycardia. There were frequent PVCs representing 21.6% total beats.  Otherwise rare ventricular couplets and triplets were noted as well as limited episodes of ventricular bigeminy and trigeminy. No pauses.   LHC 04/2020:  A stent was successfully placed.   99% mid body stenosis in SVG to obtuse marginal reduced to 10% with TIMI grade III flow using a 4.0 x 22 Onyx deployed at 12 atm.  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 (The previously placed ostial to proximal saphenous vein to OM stent overhangs into the ascending aorta preventing engagement with extra-support guide catheters.  An internal mammary guide catheter was used to coaxially aim and advanced the guidewire into the graft). Chronic occlusion of SVG to PDA. Chronic occlusion of SVG to diagonal. Patent LIMA to the mid to distal LAD.  Supplies collaterals to the PDA. Total occlusion of the mid LAD.  95% ostial LAD stenosis with only septal perforator and a small diagonal at risk. Total occlusion of the mid circumflex Total occlusion of the mid RCA.  PDA is supplied by LAD collaterals  when the LIMA graft to the LAD is visualized. Echocardiogram documents preserved LV systolic function, EF greater than 50%.  LVEDP is normal, 7 mmHg.   RECOMMENDATIONS:   Aspirin and Plavix for 12 months. If decision to use anticoagulation for management of atrial fibrillation is made, would recommend discontinuing aspirin after 1 month and continuing Plavix to complete 12 months   Echo 04/2020:  1. Left ventricular ejection fraction, by estimation, is 55 to 60%. The  left ventricle has normal function. The left ventricle demonstrates  regional wall motion abnormalities (see scoring diagram/findings for  description). There is severe left  ventricular hypertrophy. Left ventricular diastolic function  could not be  evaluated. There is moderate hypokinesis of the left ventricular,  basal-mid inferior wall.   2. Right ventricular systolic function is low normal. The right  ventricular size is normal. Tricuspid regurgitation signal is inadequate  for assessing PA pressure.   3. Left atrial size was severely dilated.   4. Right atrial size was mildly dilated.   5. Moderate pleural effusion in the left lateral region.   6. The mitral valve is abnormal. Trivial mitral valve regurgitation.   7. The aortic valve is tricuspid. Aortic valve regurgitation is not  visualized. Mild aortic valve sclerosis is present, with no evidence of  aortic valve stenosis. Aortic valve mean gradient measures 6.0 mmHg.   8. The inferior vena cava is dilated in size with <50% respiratory  variability, suggesting right atrial pressure of 15 mmHg.   Comparison(s): Changes from prior study are noted. 12/09/15: LVEF 65-70%,  normal wall motion. Laboratory Data:  High Sensitivity Troponin:   Recent Labs  Lab 01/17/23 1001 01/17/23 1145 01/18/23 0441  TROPONINIHS 36* 134* 5,346*     Chemistry Recent Labs  Lab 01/17/23 1001 01/18/23 0412  NA 134* 135  K 3.9 3.7  CL 100 102  CO2 23 23  GLUCOSE 135* 100*  BUN 27* 23  CREATININE 1.36* 1.16  CALCIUM 8.5* 8.7*  GFRNONAA 53* >60  ANIONGAP 11 10    Recent Labs  Lab 01/17/23 1007  PROT 6.4*  ALBUMIN 3.7  AST 20  ALT 14  ALKPHOS 47  BILITOT 0.9   Lipids No results for input(s): "CHOL", "TRIG", "HDL", "LABVLDL", "LDLCALC", "CHOLHDL" in the last 168 hours.  Hematology Recent Labs  Lab 01/17/23 1001 01/18/23 0412  WBC 8.4 8.7  RBC 2.27* 2.68*  HGB 6.1* 7.2*  HCT 19.7* 22.8*  MCV 86.8 85.1  MCH 26.9 26.9  MCHC 31.0 31.6  RDW 15.8* 15.8*  PLT 279 230   Thyroid No results for input(s): "TSH", "FREET4" in the last 168 hours.  BNP Recent Labs  Lab 01/17/23 1007  BNP 239.0*    DDimer No results for input(s): "DDIMER" in the last 168  hours.   Radiology/Studies:  DG Chest Port 1 View  Result Date: 01/17/2023 CLINICAL DATA:  Atrial fibrillation and chest pain EXAM: PORTABLE CHEST 1 VIEW COMPARISON:  Chest radiograph dated 04/19/2020 FINDINGS: Normal lung volumes. Severe bilateral interstitial opacities. Trace blunting of the bilateral costophrenic angles. No pneumothorax. Similar enlarged cardiomediastinal silhouette. Median sternotomy wires are nondisplaced. IMPRESSION: 1. Severe bilateral interstitial opacities, likely pulmonary edema. 2. Trace blunting of the bilateral costophrenic angles, which may represent trace pleural effusions. 3. Similar cardiomegaly. Electronically Signed   By: Agustin Cree M.D.   On: 01/17/2023 12:31     Assessment and Plan:    NSTEMI with troponins >5000 and EKG changes consistent with ischemia, now pain free,  in the setting of GI bleed. Discussed potential cardiac catheterization but patient wants medical therapy and declines transfer and cath. Check echo, diureses. Repeat EKG pending.  Acute CHF-I/o's negative 1.5L but still with edema and rales-check echo, continue Lasix 40 mg IV ddaily  Acute GI bleed Hbg 6.1 s/p 2 unit PRBC's per GI  CAD, s/p CABG Hx graft dx and DES to SVG-OM in 2017, s/p DES to SVG-OM in 2022, occlusion of PDA and SVG diagonal, patent LIMA-LAD. Continue Imdur, Losartan, Nexlizet, and NTG PRN. ASA stopped due to GI bleed     Permanent A-fib, bradycardia Denies any tachycardia or palpitations. Previous monitor revealed predominant A-fib with average HR 41 bpm,  could be consistent with chronotropic incompetence and symptomatic bradycardia, frequent PVC's also noted, no pauses. No longer on BB as outpatient but ordered here 12.5 mg bid-HR in 70's but doubt he'll tolerate as OP.   Has consistently declined AC in the past.     Mixed HLD, hx of statin intolerance, carotid artery stenosis Carotid duplex revealed 1-39% stenosis along bilateral ICA, denies any symptoms.  Risk  Assessment/Risk Scores:     TIMI Risk Score for Unstable Angina or Non-ST Elevation MI:   The patient's TIMI risk score is 7, which indicates a 41% risk of all cause mortality, new or recurrent myocardial infarction or need for urgent revascularization in the next 14 days.  New York Heart Association (NYHA) Functional Class NYHA Class IV  CHA2DS2-VASc Score = 6   This indicates a 9.7% annual risk of stroke. The patient's score is based upon: CHF History: 0 HTN History: 1 Diabetes History: 0 Stroke History: 2 Vascular Disease History: 1 Age Score: 2 Gender Score: 0         For questions or updates, please contact [facility] Please consult [email or web address] for contact info under    Signed, Jacolyn Reedy, PA-C  01/18/2023 8:31 AM

## 2023-01-18 NOTE — Progress Notes (Addendum)
Gastroenterology Progress Note    Patient ID: William Elliott; [id number]; 03-15-1940    Subjective   No abdominal pain, N/V. No overt GI bleeding. No BM since admission. Denies chest pain, shortness of breath. Receiving additional 1 unit PRBCs currently.    Objective   Vital signs in last 24 hours Temp:  [97.7 F (36.5 C)-98.1 F (36.7 C)] 98 F (36.7 C) (11/18 0400) Pulse Rate:  [42-82] 69 (11/18 0901) Resp:  [15-24] 24 (11/18 0900) BP: (94-176)/(29-146) 133/58 (11/18 0901) SpO2:  [91 %-100 %] 97 % (11/18 0900) Weight:  [82.7 kg-90.7 kg] 82.7 kg (11/18 0600) Last BM Date : 01/16/23  Physical Exam General:   Alert and oriented, pleasant, pale Head:  Normocephalic and atraumatic. Abdomen:  Bowel sounds present, soft, non-tender, non-distended. No HSM or hernias noted. No rebound or guarding. No masses appreciated  Neurologic:  Alert and  oriented x4   Intake/Output from previous day: 11/17 0701 - 11/18 0700 In: 807.2 [P.O.:120; I.V.:43.8; Blood:643.3] Out: 2300 [Urine:2300] Intake/Output this shift: Total I/O In: 3 [I.V.:3] Out: 375 [Urine:375]  Lab Results  Recent Labs    01/17/23 1001 01/18/23 0412  WBC 8.4 8.7  HGB 6.1* 7.2*  HCT 19.7* 22.8*  PLT 279 230   BMET Recent Labs    01/17/23 1001 01/18/23 0412  NA 134* 135  K 3.9 3.7  CL 100 102  CO2 23 23  GLUCOSE 135* 100*  BUN 27* 23  CREATININE 1.36* 1.16  CALCIUM 8.5* 8.7*   LFT Recent Labs    01/17/23 1007  PROT 6.4*  ALBUMIN 3.7  AST 20  ALT 14  ALKPHOS 47  BILITOT 0.9  BILIDIR 0.2  IBILI 0.7   PT/INR Recent Labs    01/17/23 1029  LABPROT 15.2  INR 1.2     Studies/Results DG Chest Port 1 View  Result Date: 01/17/2023 CLINICAL DATA:  Atrial fibrillation and chest pain EXAM: PORTABLE CHEST 1 VIEW COMPARISON:  Chest radiograph dated 04/19/2020 FINDINGS: Normal lung volumes. Severe bilateral interstitial opacities. Trace blunting of the bilateral costophrenic angles. No  pneumothorax. Similar enlarged cardiomediastinal silhouette. Median sternotomy wires are nondisplaced. IMPRESSION: 1. Severe bilateral interstitial opacities, likely pulmonary edema. 2. Trace blunting of the bilateral costophrenic angles, which may represent trace pleural effusions. 3. Similar cardiomegaly. Electronically Signed   By: Agustin Cree M.D.   On: 01/17/2023 12:31    Assessment  82 y.o. male with a history of CAD, CABG s/p DES in 2017, afib, hyperlipidemia, bradycardia, HTN, stroke, presenting this admission with acute blood loss anemia in setting of GI bleed, NSTEMI, troponins greater than 5000 and EKG changes in setting of acute GI bleed. Patient desires medical therapy and declining transfer for cath. ECHO ordered by cardiology. Admitting Hgb 6.1. Last HGb on file was 11 in Aug 2024 through PCP.   Received 2 units PRBCs this admission with improvement to 7.2. Receiving 3rd unit currently. No active GI bleeding reported by patient or staff.   Prior history of GI bleeding last year at outside hospital with positive CTA April 2023 noting active GI bleeding originating from the colon at splenic flexure and extensive diverticulosis. He notes colonoscopy many years ago and no recent EGD.   Cardiology is on board and ordered ECHO. Patient with NSTEMI but desires medical intervention and declining transfer. Will need to await ECHO findings. Unclear etiology but differentials including diverticular, polyp, malignancy, AVM, etc.     Plan / Recommendations  Clear liquids Supportive care ECHO  ordered and pending PPI BID Colonoscopy +/- EGD when stabilized from cardiopulmonary standpoint.     LOS: 1 day    01/18/2023, 9:47 AM  Gelene Mink, PhD, ANP-BC [facility] G[facility]enter[facility]

## 2023-01-18 NOTE — Progress Notes (Signed)
Progress Note  RN called due to troponin increasing to 5, 346 from 136.  Patient was admitted yesterday due to acute on 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 in which patient's hemoglobin was noted to be at 6.1 (baseline usually 10 and 11).  GI was already consulted. Patient with history of CAD status post 4V CABG-19  with LIMA-LAD, SVG-DIAG, SVG-OM, SVG-RPDA and DES to SVG-OM in 2017. Patient was in no acute distress and denies any chest pain. EKG was repeated and showed A-fib with rate control. This may still be due to type II demand ischemia considering the GI bleed.  However, it would be reasonable to rule out NSTEMI Continue telemetry Cardiology was already consulted and we shall await further recommendations  Note: Please refer to admission H&P and gastroenterology consult note for details regarding the care of this patient   Total time:  26 minutes This includes time reviewing the chart including progress notes, labs, EKGs, taking medical decisions, ordering labs and documenting findings.

## 2023-01-18 NOTE — Progress Notes (Signed)
PROGRESS NOTE William Elliott, is a 82 y.o. male, DOB - [birth year], ZOX:[id number]  Admit date - 01/17/2023   Admitting Physician Denaja Verhoeven Mariea Clonts, MD  Outpatient Primary MD for the patient is Benita Stabile, MD  LOS - 1  Chief Complaint  Patient presents with   Chest Pain        Brief Narrative:  82 y.o. male with Pmhx relevant for permanent atrial fibrillation not on [facility] (due to history of GI bleed), CAD s/p 4V CABG - 1999 with LIMA-LAD, SVG-DIAG, SVG-OM, SVG-RPDA and DES to SVG-OM in 2017, HTN, HLD, COPD, bilateral carotid artery stenosis, history of CVA, history of GI bleeding admitted on 01/17/2023 with chest pain-elevated troponin in the setting of GI bleed with hemoglobin down to 6.1   -Assessment and Plan: 1)Acute on 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-----Had dark BM on 01/16/2023 -Exam by EDP reveals hematochezia no abdominal pain cannot rule out diverticular bleed, upper GI bleed remains in the differential =-GI consult appreciated 01/18/23 -Hgb is up to 7.2 from 6.1 after transfusion of 2 units of PRBC on 01/17/2023 -Baseline hemoglobin usually between 10 and 11 -Will transfuse additional 1 unit of PRBC for total 3 units of PRBC this admission -Defer to GI team regarding timing of possible endoluminal evaluation -Protonix as ordered   2)Chest pains the patient with CAD/eElevated Troponins/NSTEMI ---H/o  CAD s/p 4V CABG - 1999 with LIMA-LAD, SVG-DIAG, SVG-OM, SVG-RPDA and DES to SVG-OM in 2017-- ----Troponin 36 >>134 >>5,346 -BNP 239 -EKG with atrial fibrillation rate controlled with PVCs no ACS type findings -LHC in February 2022 with obstructive CAD, stents were placed -added   Imdur -Added low-dose metoprolol --watch closely as patient has history of chronotropic incompetence and propensity towards bradycardia -Echo pending -Cardiology consult appreciated -Patient declines repeat LHC, patient declines [facility] to [facility]--he wants conservative management --Avoid aspirin  or heparin given acute GI bleed as above #1   3)Permanent A-fib--- propensity towards bradycardia (history of chronotropic incompetence)--not on chronic anticoagulation due to recurrent GI bleed requiring transfusion -Watch closely while on low-dose metoprolol as noted above #2 - 4) history of CVA--patient is intolerant to statins -No aspirin due to #1 above   5)AKI----acute kidney injury  -Creatinine up to 1.36 - baseline 0.8-1.0 -Creatinine  improved down to 1.1 -- renally adjust medications, avoid nephrotoxic agents / dehydration  / hypotension   6) social/ethics--- plan of care and advanced directive discussed with patient,  patient's wife and son [name] at bedside -he is  a full code -Get palliative care consult   7)COPD--- continue bronchodilators, no acute exacerbation at this time   8)HFpEF--- history of chronic diastolic dysfunction CHF echo from 04/21/2020 with EF of 55 to 60% --BNP 239 --Chest x-ray suggestive of pulmonary edema and trace pleural effusions -c/n IV Lasix  -Troponins elevated as above -Repeat echo pending   Status is: Inpatient   Remains inpatient appropriate because:    Status is: Inpatient   Disposition: The patient is from: Home              Anticipated d/c is to: Home              Anticipated d/c date is: 2 days              Patient currently is not medically stable to d/c. Barriers: Not Clinically Stable-   Code Status : -  Code Status: Full Code   Family Communication:   (patient is alert, awake and coherent)  Discussed with  wife and daughter [name]  DVT Prophylaxis  :   - SCDs  SCDs Start: 01/17/23 1156 Place TED hose Start: 01/17/23 1156   Lab Results  Component Value Date   PLT 230 01/18/2023    Inpatient Medications  Scheduled Meds:  sodium chloride   Intravenous Once   arformoterol  15 mcg Nebulization BID   And   umeclidinium bromide  1 puff Inhalation Daily   Chlorhexidine Gluconate Cloth  6 each Topical Daily   feeding  supplement  1 Container Oral TID BM   furosemide  40 mg Intravenous Daily   furosemide  40 mg Intravenous Once   isosorbide mononitrate  30 mg Oral Daily   losartan  50 mg Oral Daily   metoprolol tartrate  12.5 mg Oral BID   pantoprazole (PROTONIX) IV  40 mg Intravenous Q12H   potassium chloride SA  20 mEq Oral BID   sodium chloride flush  3 mL Intravenous Q12H   sodium chloride flush  3 mL Intravenous Q12H   Continuous Infusions:  sodium chloride     PRN Meds:.sodium chloride, acetaminophen **OR** acetaminophen, albuterol, ALPRAZolam, bisacodyl, ondansetron **OR** ondansetron (ZOFRAN) IV, mouth rinse, polyethylene glycol, sodium chloride flush, traZODone   Anti-infectives (From admission, onward)    None        Subjective: William Elliott today has no fevers, no emesis,  No chest pain,   -Complains of dyspnea -he is voiding well --no further BM  Objective: Vitals:   01/18/23 0800 01/18/23 0900 01/18/23 0901 01/18/23 1000  BP: (!) 109/55 (!) 133/58 (!) 133/58 (!) 126/56  Pulse: 67 69 69 (!) 59  Resp: (!) 23 (!) 24  19  Temp:    98.2 F (36.8 C)  TempSrc:    Oral  SpO2: 94% 97%  97%  Weight:      Height:        Intake/Output Summary (Last 24 hours) at 01/18/2023 1020 Last data filed at 01/18/2023 1019 Gross per 24 hour  Intake 810.16 ml  Output 2975 ml  Net -2164.84 ml   Filed Weights   01/17/23 0958 01/17/23 1223 01/18/23 0600  Weight: 90.7 kg 84 kg 82.7 kg   Physical Exam  Gen:- Awake Alert,  in no apparent distress  HEENT:- [place].AT, No sclera icterus Neck-Supple Neck,No JVD,.  Lungs-  CTAB , fair symmetrical air movement CV- S1, S2 normal, regular , CABG scar Abd-  +ve B.Sounds, Abd Soft, No tenderness,    Extremity/Skin:- No  edema, pedal pulses present , prominent Varicose veins  Psych-affect is appropriate, oriented x3 Neuro-no new focal deficits, no tremors  Data Reviewed: I have personally reviewed following labs and imaging  studies  CBC: Recent Labs  Lab 01/17/23 1001 01/18/23 0412  WBC 8.4 8.7  HGB 6.1* 7.2*  HCT 19.7* 22.8*  MCV 86.8 85.1  PLT 279 230   Basic Metabolic Panel: Recent Labs  Lab 01/17/23 1001 01/18/23 0412  NA 134* 135  K 3.9 3.7  CL 100 102  CO2 23 23  GLUCOSE 135* 100*  BUN 27* 23  CREATININE 1.36* 1.16  CALCIUM 8.5* 8.7*   GFR: Estimated Creatinine Clearance: 52.4 mL/min (by C-G formula based on SCr of 1.16 mg/dL). Liver Function Tests: Recent Labs  Lab 01/17/23 1007  AST 20  ALT 14  ALKPHOS 47  BILITOT 0.9  PROT 6.4*  ALBUMIN 3.7   Recent Results (from the past 240 hour(s))  MRSA Next Gen by PCR, Nasal     Status:  None   Collection Time: 01/17/23 12:25 PM   Specimen: Nasal Mucosa; Nasal Swab  Result Value Ref Range Status   MRSA by PCR Next Gen NOT DETECTED NOT DETECTED Final    Comment: (NOTE) The GeneXpert MRSA Assay (FDA approved for NASAL specimens only), is one component of a comprehensive MRSA colonization surveillance program. It is not intended to diagnose MRSA infection nor to guide or monitor treatment for MRSA infections. Test performance is not FDA approved in patients less than 90 years old. Performed at [facility], [address] [place], [place] [address]     Radiology Studies: [facility] 1 View  Result Date: 01/17/2023 CLINICAL DATA:  Atrial fibrillation and chest pain EXAM: PORTABLE CHEST 1 VIEW COMPARISON:  Chest radiograph dated 04/19/2020 FINDINGS: Normal lung volumes. Severe bilateral interstitial opacities. Trace blunting of the bilateral costophrenic angles. No pneumothorax. Similar enlarged cardiomediastinal silhouette. Median sternotomy wires are nondisplaced. IMPRESSION: 1. Severe bilateral interstitial opacities, likely pulmonary edema. 2. Trace blunting of the bilateral costophrenic angles, which may represent trace pleural effusions. 3. Similar cardiomegaly. Electronically Signed   By: Agustin Cree M.D.   On: 01/17/2023 12:31     Scheduled Meds:  sodium chloride   Intravenous Once   arformoterol  15 mcg Nebulization BID   And   umeclidinium bromide  1 puff Inhalation Daily   Chlorhexidine Gluconate Cloth  6 each Topical Daily   feeding supplement  1 Container Oral TID BM   furosemide  40 mg Intravenous Daily   furosemide  40 mg Intravenous Once   isosorbide mononitrate  30 mg Oral Daily   losartan  50 mg Oral Daily   metoprolol tartrate  12.5 mg Oral BID   pantoprazole (PROTONIX) IV  40 mg Intravenous Q12H   potassium chloride SA  20 mEq Oral BID   sodium chloride flush  3 mL Intravenous Q12H   sodium chloride flush  3 mL Intravenous Q12H   Continuous Infusions:  sodium chloride      LOS: 1 day   Shon Hale M.D on 01/18/2023 at 10:20 AM  Go to [email or web address] - for contact info  [facility] - Office  [phone]  If 7PM-7AM, please contact night-coverage [email or web address] 01/18/2023, 10:20 AM

## 2023-01-19 ENCOUNTER — Encounter (HOSPITAL_COMMUNITY): Payer: Self-pay | Admitting: Family Medicine

## 2023-01-19 DIAGNOSIS — D62 Acute posthemorrhagic anemia: Secondary | ICD-10-CM | POA: Diagnosis not present

## 2023-01-19 DIAGNOSIS — K922 Gastrointestinal hemorrhage, unspecified: Principal | ICD-10-CM

## 2023-01-19 DIAGNOSIS — I214 Non-ST elevation (NSTEMI) myocardial infarction: Secondary | ICD-10-CM

## 2023-01-19 DIAGNOSIS — I5021 Acute systolic (congestive) heart failure: Secondary | ICD-10-CM

## 2023-01-19 DIAGNOSIS — Z515 Encounter for palliative care: Secondary | ICD-10-CM

## 2023-01-19 DIAGNOSIS — Z7189 Other specified counseling: Secondary | ICD-10-CM | POA: Diagnosis not present

## 2023-01-19 DIAGNOSIS — I4821 Permanent atrial fibrillation: Secondary | ICD-10-CM | POA: Diagnosis not present

## 2023-01-19 LAB — BASIC METABOLIC PANEL
Anion gap: 9 (ref 5–15)
BUN: 18 mg/dL (ref 8–23)
CO2: 25 mmol/L (ref 22–32)
Calcium: 8.6 mg/dL — ABNORMAL LOW (ref 8.9–10.3)
Chloride: 100 mmol/L (ref 98–111)
Creatinine, Ser: 1.17 mg/dL (ref 0.61–1.24)
GFR, Estimated: >60 mL/min (ref >60)
Glucose, Bld: 110 mg/dL — ABNORMAL HIGH (ref 70–99)
Potassium: 3.4 mmol/L — ABNORMAL LOW (ref 3.5–5.1)
Sodium: 134 mmol/L — ABNORMAL LOW (ref 135–145)

## 2023-01-19 LAB — BPAM RBC
Blood Product Expiration Date: 202412012359
Blood Product Expiration Date: 202412012359
Blood Product Expiration Date: 202412022359
ISSUE DATE / TIME: 202411171235
ISSUE DATE / TIME: 202411181003
ISSUE DATE / TIME: 202411171435
Unit Type and Rh: 5100
Unit Type and Rh: 5100
Unit Type and Rh: 5100

## 2023-01-19 LAB — TYPE AND SCREEN
ABO/RH(D): O POS
Antibody Screen: NEGATIVE
Unit division: 0
Unit division: 0
Unit division: 0

## 2023-01-19 LAB — GLUCOSE, CAPILLARY
Glucose-Capillary: 110 mg/dL — ABNORMAL HIGH (ref 70–99)
Glucose-Capillary: 95 mg/dL (ref 70–99)
Glucose-Capillary: 124 mg/dL — ABNORMAL HIGH (ref 70–99)

## 2023-01-19 LAB — CBC
HCT: 27.3 % — ABNORMAL LOW (ref 39.0–52.0)
Hemoglobin: 8.9 g/dL — ABNORMAL LOW (ref 13.0–17.0)
MCH: 27.7 pg (ref 26.0–34.0)
MCHC: 32.6 g/dL (ref 30.0–36.0)
MCV: 85 fL (ref 80.0–100.0)
Platelets: 229 10*3/uL (ref 150–400)
RBC: 3.21 MIL/uL — ABNORMAL LOW (ref 4.22–5.81)
RDW: 15.8 % — ABNORMAL HIGH (ref 11.5–15.5)
WBC: 9.4 10*3/uL (ref 4.0–10.5)
nRBC: 0 % (ref 0.0–0.2)

## 2023-01-19 LAB — TROPONIN I (HIGH SENSITIVITY): Troponin I (High Sensitivity): 2482 ng/L (ref <18)

## 2023-01-19 MED ORDER — METOPROLOL TARTRATE 12.5 MG HALF TABLET
12.5000 mg | ORAL_TABLET | Freq: Two times a day (BID) | ORAL | Status: DC
  Administered 2023-01-21 – 2023-01-22 (×3): 12.5 mg via ORAL
  Filled 2023-01-19 (×4): qty 1

## 2023-01-19 MED ORDER — POTASSIUM CHLORIDE CRYS ER 20 MEQ PO TBCR
40.0000 meq | EXTENDED_RELEASE_TABLET | ORAL | Status: AC
  Administered 2023-01-19 (×2): 40 meq via ORAL
  Filled 2023-01-19 (×2): qty 2

## 2023-01-19 MED ORDER — POTASSIUM CHLORIDE CRYS ER 20 MEQ PO TBCR
20.0000 meq | EXTENDED_RELEASE_TABLET | Freq: Two times a day (BID) | ORAL | Status: DC
  Administered 2023-01-20 – 2023-01-21 (×4): 20 meq via ORAL
  Filled 2023-01-19 (×4): qty 1

## 2023-01-19 MED ORDER — ALBUTEROL SULFATE (2.5 MG/3ML) 0.083% IN NEBU: 2.5000 mg | INHALATION_SOLUTION | RESPIRATORY_TRACT | Status: DC | PRN

## 2023-01-19 NOTE — Plan of Care (Signed)

## 2023-01-19 NOTE — Progress Notes (Addendum)
   Patient Name: William Elliott Date of Encounter: 01/19/2023 [facility] Cardiologist: Nona Dell, MD    Interval Summary  .    Feeling much better. Lying flat, son at bedside. Discussed echo results.  Vital Signs .    Vitals:   01/19/23 0600 01/19/23 0700 01/19/23 0711 01/19/23 0715  BP: (!) 138/55 (!) 150/55    Pulse: 64 61    Resp: 18 20    Temp:      TempSrc:      SpO2:  96% 97% 97%  Weight:      Height:        Intake/Output Summary (Last 24 hours) at 01/19/2023 0744 Last data filed at 01/19/2023 0224 Gross per 24 hour  Intake 641.34 ml  Output 2855 ml  Net -2213.66 ml      01/18/2023    6:00 AM 01/17/2023   12:23 PM 01/17/2023    9:58 AM  Last 3 Weights  Weight (lbs) 182 lb 5.1 oz 185 lb 3 oz 200 lb  Weight (kg) 82.7 kg 84 kg 90.719 kg      Telemetry/ECG    Afib with PVC's - Personally Reviewed  Physical Exam .   GEN: No acute distress.   Neck: No JVD Cardiac:  irreg irreg,2/6 systolic murmur LSB,no rubs, or gallops.  Respiratory: Clear to auscultation bilaterally. GI: Soft, nontender, non-distended  MS: No edema  Assessment & Plan .     NSTEMI with troponins >5000 now coming down to 2482,and EKG changes consistent with ischemia, now pain free, in the setting of GI bleed. Discussed potential cardiac catheterization but patient wants medical therapy and declines transfer and cath. New LVD EF 45% on echo, diureses. Repeat EKG still with diffuse ST depression. Will arrange close outpatient f/u in [place] office.   Acute CHF-I/o's negative 3.7L but still with edema and rales-check echo, continue Lasix 40 mg IV today and convert to oral tomorrow. Echo yest LVEF 45%, severe asymmetric LVH, mild AS   Acute GI bleed Hbg 8.9 s/p several unit PRBC's per GI   CAD, s/p CABG Hx graft dx and DES to SVG-OM in 2017, s/p DES to SVG-OM in 2022, occlusion of PDA and SVG diagonal, patent LIMA-LAD. Continue Imdur, Losartan, Nexlizet, and NTG PRN. ASA  stopped due to GI bleed     Permanent A-fib, bradycardia Denies any tachycardia or palpitations. Previous monitor revealed predominant A-fib with average HR 41 bpm,  could be consistent with chronotropic incompetence and symptomatic bradycardia, frequent PVC's also noted, no pauses. No longer on BB as outpatient but ordered here 12.5 mg bid-HR in 70's but doubt he'll tolerate as OP.   Has consistently declined AC in the past.     Mixed HLD, hx of statin intolerance, carotid artery stenosis Carotid duplex revealed 1-39% stenosis along bilateral ICA, denies any symptoms. For questions or updates, please contact [facility] Please consult [email or web address] for contact info under        Signed, Jacolyn Reedy, PA-C

## 2023-01-19 NOTE — Progress Notes (Signed)
Initial Nutrition Assessment  DOCUMENTATION CODES:   Not applicable  INTERVENTION:   Continue Boost Breeze po TID, each supplement provides 250 kcal and 9 grams of protein  NUTRITION DIAGNOSIS:   Inadequate oral intake related to altered GI function as evidenced by other (comment) (clear liquid diet).  GOAL:   Patient will meet greater than or equal to 90% of their needs  MONITOR:   PO intake, Supplement acceptance, Diet advancement  REASON FOR ASSESSMENT:   Malnutrition Screening Tool    ASSESSMENT:   82 yo male admitted with chest pain, NSTEMI, symptomatic anemia, suspected GIB. PMH includes HLD, CAD, HLD, COPD, HTN, asthma, stroke, former smoker.  S/P 3 units PRBC transfusions since admission.   Patient reports good intake of clear liquids. He has been finishing everything on his meal trays. He has also had some Boost. He was weighing in the 190s, but now is in the 180s. He is not unhappy about losing weight. Weight history reviewed. No significant weight changes noted. Weight has fluctuated between 80 and 88 kg since June 2024. Currently 82.7 kg.  Labs reviewed. Na 134, K 3.4, Hgb 8.9 (up from 6.1 on admit) CBG: 120-93-110  Medications reviewed and include lasix, protonix, klor-con.  NUTRITION - FOCUSED PHYSICAL EXAM:  Flowsheet Row Most Recent Value  Orbital Region No depletion  Upper Arm Region No depletion  Thoracic and Lumbar Region No depletion  Buccal Region No depletion  Temple Region Mild depletion  Clavicle Bone Region No depletion  Clavicle and Acromion Bone Region No depletion  Scapular Bone Region No depletion  Dorsal Hand No depletion  Patellar Region Unable to assess  Anterior Thigh Region Unable to assess  Posterior Calf Region No depletion  Edema (RD Assessment) Mild  Hair Reviewed  Eyes Reviewed  Mouth Reviewed  Skin Reviewed  Nails Reviewed       Diet Order:   Diet Order             Diet clear liquid Room service appropriate?  Yes; Fluid consistency: Thin  Diet effective now                   EDUCATION NEEDS:   No education needs have been identified at this time  Skin:  Skin Assessment: Reviewed RN Assessment  Last BM:  11/18  Height:   Ht Readings from Last 1 Encounters:  01/17/23 5\' 10"  (1.778 m)    Weight:   Wt Readings from Last 1 Encounters:  01/18/23 82.7 kg    Ideal Body Weight:  75.5 kg  BMI:  Body mass index is 26.16 kg/m.  Estimated Nutritional Needs:   Kcal:  2000-2200  Protein:  100-115 gm  Fluid:  2-2.2 L   Gabriel Rainwater RD, LDN, CNSC Please refer to Amion for contact information.

## 2023-01-19 NOTE — Progress Notes (Signed)
Subjective: Had a dark stool yesterday. No brbpr since admission.  States he had bright red blood then dark red blood per rectum prior to admission.  No melena prior to admission.  No abdominal pain, nausea, or vomiting. Chest pain, back, pain, arm pain have all resolved. No SOB on room air.   Was on 81 mg aspirin at home.   Objective: Vital signs in last 24 hours: Temp:  [97.4 F (36.3 C)-98.7 F (37.1 C)] 98 F (36.7 C) (11/19 0527) Pulse Rate:  [39-84] 68 (11/19 0800) Resp:  [16-24] 17 (11/19 0800) BP: (100-161)/(42-83) 100/82 (11/19 0800) SpO2:  [91 %-100 %] 92 % (11/19 0800) Last BM Date : 01/18/23 General:   Alert and oriented, pleasant, elderly, no acute distress, sitting up on side of the bed. Head:  Normocephalic and atraumatic. Eyes:  No icterus, sclera clear. Conjuctiva pink.  Heart:  S1, S2 present, no murmurs noted.  Lungs: Clear to auscultation bilaterally, without wheezing, rales, or rhonchi.  Abdomen:  Bowel sounds present, soft, non-tender, non-distended. No rebound or guarding. Msk:  Symmetrical without gross deformities. Normal posture. Extremities:  Without edema. Neurologic:  Alert and  oriented x4;  grossly normal neurologically. Psych: Normal mood and affect.  Intake/Output from previous day: 11/18 0701 - 11/19 0700 In: 641.3 [P.O.:150; I.V.:29.7; Blood:461.7] Out: 2855 [Urine:2855] Intake/Output this shift: No intake/output data recorded.  Lab Results: Recent Labs    01/17/23 1001 01/18/23 0412 01/18/23 1451 01/19/23 0443  WBC 8.4 8.7  --  9.4  HGB 6.1* 7.2* 8.6* 8.9*  HCT 19.7* 22.8* 26.0* 27.3*  PLT 279 230  --  229   BMET Recent Labs    01/17/23 1001 01/18/23 0412 01/19/23 0443  NA 134* 135 134*  K 3.9 3.7 3.4*  CL 100 102 100  CO2 23 23 25   GLUCOSE 135* 100* 110*  BUN 27* 23 18  CREATININE 1.36* 1.16 1.17  CALCIUM 8.5* 8.7* 8.6*   LFT Recent Labs    01/17/23 1007  PROT 6.4*  ALBUMIN 3.7  AST 20  ALT 14  ALKPHOS 47   BILITOT 0.9  BILIDIR 0.2  IBILI 0.7   PT/INR Recent Labs    01/17/23 1029  LABPROT 15.2  INR 1.2    Studies/Results: ECHOCARDIOGRAM COMPLETE  Result Date: 01/18/2023    ECHOCARDIOGRAM REPORT   Patient Name:   JENNINGS MAKRIS Date of Exam: 01/18/2023 Medical Rec #:  [id number]       Height:       70.0 in Accession #:    [id number]      Weight:       182.3 lb Date of Birth:  Nov 03, 1940      BSA:          2.007 m Patient Age:    82 years        BP:           132/48 mmHg Patient Gender: M               HR:           70 bpm. [facility] Procedure: 2D Echo, Cardiac Doppler and Color Doppler Indications:    Abnormal [facility] R94.31  History:        Patient has prior history of Echocardiogram examinations, most                 recent 04/20/2020. CAD and Previous Myocardial Infarction, Prior  CABG, COPD, Arrythmias:Atrial Fibrillation; Risk                 Factors:Hypertension, Dyslipidemia and Former Smoker.  Sonographer:    Celesta Gentile RCS Referring Phys: [phone] [employer]AGE [employer]MOKPAE IMPRESSIONS  1. Left ventricular ejection fraction, by estimation, is 45%. The left ventricle has mildly decreased function. Left ventricular endocardial border not optimally defined to evaluate regional wall motion. There is severe asymmetric left ventricular hypertrophy of the lateral segment. Left ventricular diastolic function could not be evaluated. There is the interventricular septum is flattened in systole and diastole, consistent with right ventricular pressure and volume overload.  2. Right ventricular systolic function is mildly reduced. The right ventricular size is mildly enlarged. There is moderately elevated pulmonary artery systolic pressure. The estimated right ventricular systolic pressure is 48.4 mmHg.  3. Left atrial size was severely dilated.  4. Right atrial size was severely dilated.  5. The mitral valve is abnormal. Mild mitral valve regurgitation. No evidence of mitral stenosis.  Moderate mitral annular calcification.  6. The aortic valve is tricuspid. Aortic valve regurgitation is not visualized. Mild aortic valve stenosis. Aortic valve area, by VTI measures 1.55 cm. Aortic valve mean gradient measures 8.0 mmHg. Aortic valve Vmax measures 2.11 m/s.  7. The inferior vena cava is dilated in size with <50% respiratory variability, suggesting right atrial pressure of 15 mmHg. Comparison(s): Prior images reviewed side by side. Changes from prior study are noted. LVEF worsened from normal to 45% now. FINDINGS  Left Ventricle: Left ventricular ejection fraction, by estimation, is 45%. The left ventricle has mildly decreased function. Left ventricular endocardial border not optimally defined to evaluate regional wall motion. The left ventricular internal cavity  size was normal in size. There is severe asymmetric left ventricular hypertrophy of the lateral segment. The interventricular septum is flattened in systole and diastole, consistent with right ventricular pressure and volume overload. Left ventricular diastolic function could not be evaluated due to atrial fibrillation. Left ventricular diastolic function could not be evaluated. Right Ventricle: The right ventricular size is mildly enlarged. No increase in right ventricular wall thickness. Right ventricular systolic function is mildly reduced. There is moderately elevated pulmonary artery systolic pressure. The tricuspid regurgitant velocity is 2.89 m/s, and with an assumed right atrial pressure of 15 mmHg, the estimated right ventricular systolic pressure is 48.4 mmHg. Left Atrium: Left atrial size was severely dilated. Right Atrium: Right atrial size was severely dilated. Pericardium: There is no evidence of pericardial effusion. Mitral Valve: The mitral valve is abnormal. Moderate mitral annular calcification. Mild mitral valve regurgitation. No evidence of mitral valve stenosis. Tricuspid Valve: The tricuspid valve is normal in  structure. Tricuspid valve regurgitation is mild . No evidence of tricuspid stenosis. Aortic Valve: The aortic valve is tricuspid. Aortic valve regurgitation is not visualized. Mild aortic stenosis is present. Aortic valve mean gradient measures 8.0 mmHg. Aortic valve peak gradient measures 17.8 mmHg. Aortic valve area, by VTI measures 1.55 cm. Pulmonic Valve: The pulmonic valve was normal in structure. Pulmonic valve regurgitation is trivial. No evidence of pulmonic stenosis. Aorta: The aortic root is normal in size and structure. Venous: The inferior vena cava is dilated in size with less than 50% respiratory variability, suggesting right atrial pressure of 15 mmHg. IAS/Shunts: No atrial level shunt detected by color flow Doppler.  LEFT VENTRICLE PLAX 2D LVIDd:         5.00 cm LVIDs:         3.60 cm LV PW:  1.80 cm LV IVS:        1.30 cm LVOT diam:     1.90 cm LV SV:         70 LV SV Index:   35 LVOT Area:     2.84 cm  RIGHT VENTRICLE RV S prime:     9.69 cm/s TAPSE (M-mode): 1.9 cm LEFT ATRIUM              Index        RIGHT ATRIUM           Index LA diam:        5.40 cm  2.69 cm/m   RA Area:     27.90 cm LA Vol (A2C):   148.0 ml 73.75 ml/m  RA Volume:   91.90 ml  45.79 ml/m LA Vol (A4C):   132.0 ml 65.78 ml/m LA Biplane Vol: 140.0 ml 69.76 ml/m  AORTIC VALVE AV Area (Vmax):    1.59 cm AV Area (Vmean):   1.71 cm AV Area (VTI):     1.55 cm AV Vmax:           211.00 cm/s AV Vmean:          129.000 cm/s AV VTI:            0.454 m AV Peak Grad:      17.8 mmHg AV Mean Grad:      8.0 mmHg LVOT Vmax:         118.00 cm/s LVOT Vmean:        77.900 cm/s LVOT VTI:          0.248 m LVOT/AV VTI ratio: 0.55  AORTA Ao Root diam: 3.90 cm MITRAL VALVE                  TRICUSPID VALVE MV Area (PHT): 4.49 cm       TR Peak grad:   33.4 mmHg MV Decel Time: 169 msec       TR Vmax:        289.00 cm/s MR Peak grad:    92.5 mmHg MR Mean grad:    60.0 mmHg    SHUNTS MR Vmax:         481.00 cm/s  Systemic VTI:  0.25 m MR  Vmean:        362.0 cm/s   Systemic Diam: 1.90 cm MR PISA:         0.57 cm MR PISA Eff ROA: 4 mm MR PISA Radius:  0.30 cm MV E velocity: 165.00 cm/s Vishnu Priya Mallipeddi Electronically signed by Winfield Rast Mallipeddi Signature Date/Time: 01/18/2023/3:36:05 PM    Final    DG Chest Port 1 View  Result Date: 01/17/2023 CLINICAL DATA:  Atrial fibrillation and chest pain EXAM: PORTABLE CHEST 1 VIEW COMPARISON:  Chest radiograph dated 04/19/2020 FINDINGS: Normal lung volumes. Severe bilateral interstitial opacities. Trace blunting of the bilateral costophrenic angles. No pneumothorax. Similar enlarged cardiomediastinal silhouette. Median sternotomy wires are nondisplaced. IMPRESSION: 1. Severe bilateral interstitial opacities, likely pulmonary edema. 2. Trace blunting of the bilateral costophrenic angles, which may represent trace pleural effusions. 3. Similar cardiomegaly. Electronically Signed   By: Agustin Cree M.D.   On: 01/17/2023 12:31    Assessment: 82 y.o. male with a history of CAD, CABG s/p DES in 2017 on 81 mg aspirin at home, afib, hyperlipidemia, bradycardia, HTN, stroke, presenting this admission with acute blood loss anemia in setting of GI bleed, NSTEMI, troponins greater than 5000 and  EKG changes in setting of acute GI bleed. Patient desires medical therapy and declining transfer for cath. ECHO completed yesterday with LVEF 45%, severe asymmetric LVH, mild AS.   GI bleed/symptomatic anemia:  Acute onset bright red blood per rectum followed by dark red blood prior to admission.  No frank GI bleeding since admission though he did have a dark stool yesterday.  Heme been 6.1 on admission. He has received 3 units PRBCs this admission with hemoglobin improved to 8.9 this morning.  Notably, he had similar presentation at outside hospital in April of last year with CT angiogram abdomen and pelvis showing active bleeding at the splenic flexure, extensive diverticulosis.  His last colonoscopy was  greater than 10 years ago.  Suspect patient may have experienced a diverticular bleed, but as he has not had any sort of endoscopic evaluation in greater than 10 years and has also had dark stool, differential is broad including but not limited to polyps, malignancy, AVMs, gastritis, duodenitis, PUD.   He needs colonoscopy and possible EGD for further evaluation especially as cardiology would like to rechallenge with 81 mg aspirin.  Notably, he is very high risk for anesthesia.  Case has been discussed with anesthesiologist who has recommended transfer to [facility] due to anesthesia risk.    NSTEMI:  Suspected to be secondary to acute on chronic anemia/GI bleed.  Troponins peaked at greater than 5000, now coming down to 2482 today.  EKG was consistent with ischemia.  Patient is now pain-free.  Cardiology recommended cardiac cath, but patient requested medical therapy.  He did have echocardiogram yesterday showing LVEF 45%, severe asymmetric LVH, mild AS.  Cardiology is recommending close outpatient follow-up.  They would like to rechallenge him on antiplatelet therapy pending evaluation of GI bleed.   Plan: Needs colonoscopy +/- EGD but due to anesthesia risk, he will need to be transferred to [facility]. Continue to monitor H&H closely and transfuse as needed to keep hemoglobin greater than 8. Continue to monitor for overt GI bleeding.   Continue IV PPI twice daily    LOS: 2 days    01/19/2023, 8:47 AM   Ermalinda Memos, PA-C [facility]

## 2023-01-19 NOTE — Consult Note (Signed)
Consultation Note Date: 01/19/2023   Patient Name: William Elliott  DOB: 03/23/1940  MRN: [id number]  Age / Sex: 82 y.o., male  PCP: Benita Stabile, MD Referring Physician: Shon Hale, MD  Reason for Consultation: Establishing goals of care  HPI/Patient Profile: 82 y.o. male  with past medical history of permanent atrial fibrillation not on [facility] (due to history of GI bleed), CAD s/p 4V CABG - 1999 with LIMA-LAD, SVG-DIAG, SVG-OM, SVG-RPDA and DES to SVG-OM in 2017, HTN, HLD, COPD, bilateral carotid artery stenosis, history of CVA, history of GI bleeding  admitted on 01/17/2023 with acute on chronic symptomatic anemia due to acute blood loss, chest pains with NSTEMI being treated medically.   Clinical Assessment and Goals of Care: I have reviewed medical records including EPIC notes, labs and imaging, received report from RN, assessed the patient.  Mr. Skates is lying quietly in bed.  He appears acutely/chronically ill and somewhat frail.  He is alert and oriented x 3, able to make his needs known.  There is no family at bedside at this time although bedside nursing staff is present attending to needs.  We meet at the bedside to discuss diagnosis prognosis, GOC, EOL wishes, disposition and options.  I introduced Palliative Medicine as specialized medical care for people living with serious illness. It focuses on providing relief from the symptoms and stress of a serious illness. The goal is to improve quality of life for both the patient and the family.  We then focused on their current illness.  Overall, Mr. Monge is knowledgeable about his acute and chronic health concerns.  He states that he has met with cardiology and understands and agrees that he will be treated medically.  He states that he does not feel that at his age it would benefit him for more invasive cardiac workup.  We also talk about the plan  from the GI standpoint.  We talked about scoping with the hope of finding a treatable source of bleeding.  He is agreeable to scope if offered.  The natural disease trajectory and expectations at EOL were discussed.  Advanced directives, concepts specific to code status, artifical feeding and hydration, and rehospitalization were considered and discussed.  We talked about the concept of "treat the treatable, but allowing natural passing.  Mr. Bosma states that he will consider his choices.  He had wanted everything done to "stay alive".  Discussed the importance of continued conversation with family and the medical providers regarding overall plan of care and treatment options, ensuring decisions are within the context of the patient's values and GOCs. Questions and concerns were addressed. The family was encouraged to call with questions or concerns.  PMT will continue to support holistically.  Conference with attending, cardiology, bedside nursing staff, transition of care team related to patient condition, needs, goals of care, disposition.   HCPOA  NEXT OF KIN -states his wife of 60 years, Marquist Dumaine, would be his healthcare surrogate.  He shares that they have 2 children, sons Dorene Sorrow  and Jeannett Senior.    SUMMARY OF RECOMMENDATIONS   At this point continue to treat the treatable Considering CODE STATUS Agrees to medical management only for cardiac condition Agreeable to scope if offered by GI PMT to follow   Code Status/Advance Care Planning: Full code - We talked about the concept of "treat the treatable, but allowing natural passing.  Mr. Heagerty states that he will consider his choices.  He had wanted everything done to "stay alive".  Symptom Management:  Per hospitalist, no additional needs at this time.  Palliative Prophylaxis:  Frequent Pain Assessment  Additional Recommendations (Limitations, Scope, Preferences): Full Scope Treatment  Psycho-social/Spiritual:  Desire for  further Chaplaincy support:no Additional Recommendations: Caregiving  Support/Resources  Prognosis:  Unable to determine, based on outcomes.  Guarded at this point due to acuity of illness  Discharge Planning: To Be Determined     Primary Diagnoses: Present on Admission:  Acute GI bleeding  Permanent atrial fibrillation (HCC)  Ischemic heart disease  Essential hypertension  Coronary artery disease involving coronary bypass graft of native heart with unstable angina pectoris (HCC)  COPD (chronic obstructive pulmonary disease) (HCC)   I have reviewed the medical record, interviewed the patient and family, and examined the patient. The following aspects are pertinent.  Past Medical History:  Diagnosis Date   Acute lower GI bleeding 2008   Anxiety    Asthma    Carotid artery disease (HCC)    Asymptomatic left carotid bruit   COPD (chronic obstructive pulmonary disease) (HCC)    Coronary artery disease    a. CABG - 1999 with LIMA-LAD, SVG-DIAG, SVG-OM, SVG-RPDA  b. Cath in setting of NSTEMI 03/18/2015: patent LIMA-LAD, occluded SVG-RCA, occluded SVG-D1, 99% stenosis of SVG-OM treated w/ DES   Depression    Dyslipidemia    Erectile dysfunction    Essential hypertension    History of blood transfusion 2008   History of stroke    Hyperlipidemia    Myocardial infarction [facility])    Social History   Socioeconomic History   Marital status: Married    Spouse name: Not on file   Number of children: Not on file   Years of education: Not on file   Highest education level: Not on file  Occupational History   Occupation: Welder x 10 yrs   Tobacco Use   Smoking status: Former    Types: Cigars    Quit date: 03/02/1997    Years since quitting: 25.9    Passive exposure: Never   Smokeless tobacco: Never   Tobacco comments:    smoked cigars x 10 yrs, never smoked cigarettes  Vaping Use   Vaping status: Never Used  Substance and Sexual Activity   Alcohol use: No    Alcohol/week: 0.0  standard drinks of alcohol    Comment: "drank some when I was young; quit in the 1990s"   Drug use: No   Sexual activity: Not Currently  Other Topics Concern   Not on file  Social History Narrative   Not on file   Social Determinants of Health   Financial Resource Strain: Not on file  Food Insecurity: No Food Insecurity (01/17/2023)   Hunger Vital Sign    Worried About Running Out of Food in the Last Year: Never true    Ran Out of Food in the Last Year: Never true  Transportation Needs: No Transportation Needs (01/17/2023)   PRAPARE - Administrator, Civil Service (Medical): No    Lack of Transportation (  Non-Medical): No  Physical Activity: Not on file  Stress: Not on file  Social Connections: Not on file   Family History  Problem Relation Age of Onset   Heart disease Father    Heart attack Father    Hypertension Mother    Stroke Mother    Scheduled Meds:  arformoterol  15 mcg Nebulization BID   And   umeclidinium bromide  1 puff Inhalation Daily   Chlorhexidine Gluconate Cloth  6 each Topical Daily   feeding supplement  1 Container Oral TID BM   furosemide  40 mg Intravenous Daily   isosorbide mononitrate  30 mg Oral Daily   losartan  50 mg Oral Daily   pantoprazole (PROTONIX) IV  40 mg Intravenous Q12H   [START ON 01/20/2023] potassium chloride SA  20 mEq Oral BID   potassium chloride  40 mEq Oral Q3H   sodium chloride flush  3 mL Intravenous Q12H   sodium chloride flush  3 mL Intravenous Q12H   Continuous Infusions: PRN Meds:.acetaminophen **OR** acetaminophen, albuterol, ALPRAZolam, bisacodyl, ondansetron **OR** ondansetron (ZOFRAN) IV, mouth rinse, polyethylene glycol, sodium chloride flush, traZODone Medications Prior to Admission:  Prior to Admission medications   Medication Sig Start Date End Date Taking? Authorizing Provider  albuterol (VENTOLIN HFA) 108 (90 Base) MCG/ACT inhaler Inhale 2 puffs into the lungs every 6 (six) hours as needed for  wheezing or shortness of breath.   Yes [provider]  ALPRAZolam Prudy Feeler) 1 MG tablet Take 1 tablet (1 mg total) by mouth at bedtime as needed for anxiety. Patient taking differently: Take 1 mg by mouth 2 (two) times daily as needed for anxiety. 11/01/16  Yes Triplett, Tammy, PA-C  ascorbic acid (VITAMIN C) 500 MG tablet Take 500 mg by mouth daily.   Yes [provider]  aspirin EC 81 MG tablet Take 1 tablet (81 mg total) by mouth daily. 02/12/17  Yes Jonelle Sidle, MD  b complex vitamins tablet Take 1 tablet by mouth daily.   Yes [provider]  calcium carbonate (TUMS - DOSED IN MG ELEMENTAL CALCIUM) 500 MG chewable tablet Chew 1 tablet by mouth daily as needed for indigestion or heartburn.   Yes [provider]  Cholecalciferol (VITAMIN D3) 25 MCG (1000 UT) CAPS Take 1 capsule by mouth in the morning and at bedtime.   Yes [provider]  diphenhydrAMINE (BENADRYL) 25 mg capsule Take 50 mg by mouth every 6 (six) hours as needed for sleep.   Yes [provider]  FEROSUL 325 (65 Fe) MG tablet Take 325 mg by mouth daily. 05/23/21  Yes [provider]  furosemide (LASIX) 20 MG tablet Take 20 mg by mouth daily.   Yes [provider]  hydrochlorothiazide (HYDRODIURIL) 25 MG tablet TAKE 1 TABLET BY MOUTH EVERY DAY Patient taking differently: Take 25 mg by mouth 3 (three) times a week. 10/27/17  Yes Jonelle Sidle, MD  isosorbide mononitrate (IMDUR) 30 MG 24 hr tablet TAKE 1 TABLET BY MOUTH TWICE DAILY 03/23/22  Yes Jonelle Sidle, MD  losartan (COZAAR) 50 MG tablet Take 50 mg by mouth daily. 01/20/19  Yes [provider]  MAGNESIUM OXIDE PO Take 1 tablet by mouth daily.   Yes [provider]  multivitamin [facility]) per tablet Take 1 tablet by mouth daily.   Yes [provider]  NEXLIZET 180-10 MG TABS Take 1 tablet by mouth daily. 07/23/22  Yes [provider]  nitroGLYCERIN  (NITROSTAT) 0.4 MG  SL tablet place 1 tablet UNDER THE TONGUE EVERY 5 MINUTES UP TO 3 times AS NEEDED FOR CHEST pain if no RELIEF AFTER 3rd DOSE proceed TO THE ED FOR an evaluation 09/28/22  Yes Jonelle Sidle, MD  Omega 3 1000 MG CAPS Take 1 capsule by mouth daily.   Yes [provider]  pantoprazole (PROTONIX) 40 MG tablet TAKE 1 TABLET BY MOUTH EVERY DAY 02/09/19  Yes Jonelle Sidle, MD  potassium chloride SA (KLOR-CON M) 20 MEQ tablet TAKE 1 TABLET BY MOUTH TWICE DAILY 12/10/22  Yes Jonelle Sidle, MD  Tiotropium Bromide-Olodaterol (STIOLTO RESPIMAT) 2.5-2.5 MCG/ACT AERS Inhale 1 puff into the lungs 2 (two) times a day.   Yes [provider]  Zinc 50 MG CAPS Take 1 capsule by mouth daily.   Yes [provider]   Allergies  Allergen Reactions   Anoro Ellipta [Umeclidinium-Vilanterol] Other (See Comments)    Cardiac problems   Lipitor [Atorvastatin Calcium] Other (See Comments)    Not known    Niaspan [Niacin] Other (See Comments)    Not known   Zocor [Simvastatin] Other (See Comments)    Not known   Fenofibrate Other (See Comments)    No energy   Review of Systems  Unable to perform ROS: Age    Physical Exam Vitals and nursing note reviewed.  Constitutional:      General: He is not in acute distress.    Appearance: He is ill-appearing.  Cardiovascular:     Rate and Rhythm: Normal rate.  Pulmonary:     Effort: Pulmonary effort is normal. No tachypnea.  Skin:    General: Skin is warm and dry.  Neurological:     Mental Status: He is alert and oriented to person, place, and time.  Psychiatric:        Mood and Affect: Mood normal.        Behavior: Behavior normal.     Vital Signs: BP 100/82   Pulse 68   Temp 98 F (36.7 C) (Oral)   Resp 17   Ht 5\' 10"  (1.778 m)   Wt 82.7 kg   SpO2 92%   BMI 26.16 kg/m  Pain Scale: 0-10   Pain Score: 0-No pain   SpO2: SpO2: 92 % O2 Device:SpO2: 92 % O2 Flow Rate: .O2 Flow Rate (L/min): 2  L/min  IO: Intake/output summary:  Intake/Output Summary (Last 24 hours) at 01/19/2023 0958 Last data filed at 01/19/2023 0913 Gross per 24 hour  Intake 638.34 ml  Output 2880 ml  Net -2241.66 ml    LBM: Last BM Date : 01/18/23 Baseline Weight: Weight: 90.7 kg Most recent weight: Weight: 82.7 kg     Palliative Assessment/Data:     Time In: 1030   Time Out: 1125 Time Total: 55 minutes  Greater than 50%  of this time was spent counseling and coordinating care related to the above assessment and plan.  Signed by: Katheran Awe, NP   Please contact Palliative Medicine Team phone at [phone] for questions and concerns.  For individual provider: See Loretha Stapler

## 2023-01-19 NOTE — Plan of Care (Signed)

## 2023-01-19 NOTE — Progress Notes (Addendum)
PROGRESS NOTE William Elliott, is a 82 y.o. male, DOB - 23-Sep-1940, QIO:[id number]  Admit date - 01/17/2023   Admitting Physician William Elliott William Clonts, MD  Outpatient Primary MD for the patient is William Stabile, MD  LOS - 2  Chief Complaint  Patient presents with   Chest Pain      Brief Narrative:  82 y.o. male with Pmhx relevant for permanent atrial fibrillation not on [facility] (due to history of GI bleed), CAD s/p 4V CABG - 1999 with LIMA-LAD, SVG-DIAG, SVG-OM, SVG-RPDA and DES to SVG-OM in 2017, HTN, HLD, COPD, bilateral carotid artery stenosis, history of CVA, history of GI bleeding admitted on 01/17/2023 with chest pain-elevated troponin in the setting of GI bleed with hemoglobin down to 6.1 -Acute GI bleed/NSTEMI  and CHF-- Cardiologist requesting endoluminal evaluation prior to restarting antiplatelet agent in the setting of NSTEMI --GI team at [facility]  is willing to do EGD and colonoscopy however anesthesia at [facility] wants patient to go to [facility] because it is too high risk for sedation he had an implant to allow for endoluminal evaluation -Patient will stay under TRH service- -@Gi  Team here at [facility] reaching out to GI team at [facility] @Cardiology  here at [facility] reaching out to cardiology team at [facility] --Ongoing palliative care conversations continue, Currently Full Code -- Transfer to [facility] as stated above   -Assessment and Plan: 1)Acute on 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-----Had dark BM on 01/16/2023 -Exam by EDP reveals hematochezia no abdominal pain cannot rule out diverticular bleed, upper GI bleed remains in the differential =-GI consult appreciated 01/19/23 -Hgb is up to 8.9 from 6.1 after transfusion of at total of 3 units of PRBC  -Baseline hemoglobin usually between 10 and 11 -Defer to GI team regarding timing of possible endoluminal evaluation -Protonix as ordered -Patient had dark stool on 01/19/2023, No BRBPR   2)Chest pains the patient with CAD/Elevated  Troponins/NSTEMI ---H/o  CAD s/p 4V CABG - 1999 with LIMA-LAD, SVG-DIAG, SVG-OM, SVG-RPDA and DES to SVG-OM in 2017-- ----Troponin 36 >>134 >>5,346> 2,482 -BNP 239 -EKG with atrial fibrillation rate controlled with PVCs no ACS type findings -LHC in February 2022 with obstructive CAD, stents were placed -Patient is open to repeat LHC if indicated -added   Imdur -Added low-dose metoprolol --watch closely as patient has history of chronotropic incompetence and propensity towards bradycardia -Echo from 01/18/2023 with EF down to 45% from 55 to 60% back in February 2022, severe LVH noted, no mitral stenosis, mild aortic stenosis noted, there is evidence of volume overload -Cardiology consult appreciated -Cardiology team will like GI team to complete endoluminal evaluation prior to committing to antiplatelet therapy due to high risk of rebleeding   3)Permanent A-fib--- propensity towards bradycardia (history of chronotropic incompetence)--not on Full anticoagulation due to recurrent GI bleed requiring transfusion -Watch closely while on low-dose metoprolol as noted above #2 - 4) history of CVA--patient is intolerant to statins -Please see discussion above in #2 regarding antiplatelet agent   5)AKI----acute kidney injury  -Creatinine up to 1.36 - baseline 0.8-1.0 -Creatinine  improved down to 1.1 -- renally adjust medications, avoid nephrotoxic agents / dehydration  / hypotension   6) social/ethics--- plan of care and advanced directive discussed with patient,  patient's wife and son William Elliott at bedside -he is  a full code -Palliative care consult appreciated   7)COPD--- continue bronchodilators, no acute exacerbation at this time   8)HFrEF--- acute systolic dysfunction CHF ---Echo as noted above in #2  with evidence of volume overload --BNP 239 --Chest x-ray suggestive of pulmonary edema and trace pleural effusions -Troponins elevated as above -Continue IV Lasix   Disposition:-Transfer to  [facility] on 01/19/2023 for further GI workup Acute GI bleed/NSTEMI  and CHF-- Cardiologist requesting endoluminal evaluation prior to restarting antiplatelet agent in the setting of NSTEMI --GI team at [facility]  is willing to do EGD and colonoscopy however anesthesia at [facility] wants patient to go to [facility] because it is too high risk for sedation he had an implant to allow for endoluminal evaluation -Patient will stay under TRH service- -@Gi  Team here at [facility] reaching out to GI team at [facility] @Cardiology  here at [facility] reaching out to cardiology team at [facility] --Ongoing palliative care conversations continue, Currently Full Code  Status is: Inpatient   Remains inpatient appropriate because:    Status is: Inpatient   Disposition: The patient is from: Home              Anticipated d/c is to: Home              Anticipated d/c date is: 2 days              Patient currently is not medically stable to d/c. Barriers: Not Clinically Stable-   Code Status : -  Code Status: Full Code   Family Communication:   (patient is alert, awake and coherent)  Discussed with wife and Son William Elliott  DVT Prophylaxis  :   - SCDs  SCDs Start: 01/17/23 1156 Place TED hose Start: 01/17/23 1156   Lab Results  Component Value Date   PLT 229 01/19/2023   Inpatient Medications Scheduled Meds:  arformoterol  15 mcg Nebulization BID   And   umeclidinium bromide  1 puff Inhalation Daily   Chlorhexidine Gluconate Cloth  6 each Topical Daily   feeding supplement  1 Container Oral TID BM   furosemide  40 mg Intravenous Daily   isosorbide mononitrate  30 mg Oral Daily   losartan  50 mg Oral Daily   pantoprazole (PROTONIX) IV  40 mg Intravenous Q12H   [START ON 01/20/2023] potassium chloride SA  20 mEq Oral BID   sodium chloride flush  3 mL Intravenous Q12H   sodium chloride flush  3 mL Intravenous Q12H   Continuous Infusions:   PRN Meds:.acetaminophen **OR** acetaminophen, albuterol, ALPRAZolam, bisacodyl,  ondansetron **OR** ondansetron (ZOFRAN) IV, mouth rinse, polyethylene glycol, sodium chloride flush, traZODone   Anti-infectives (From admission, onward)    None       Subjective: William Elliott today has no fevers, no emesis,  No chest pain,   -Son Dorene Sorrow is at bedside... Questions answered -No Significant dyspnea -Voiding well Had dark BM, no BRBPR  -No dizziness no palpitations  Objective: Vitals:   01/19/23 0715 01/19/23 0800 01/19/23 0900 01/19/23 1200  BP:  100/82 (!) 147/106 (!) 139/101  Pulse:  68 61   Resp:  17 17 16   Temp:      TempSrc:      SpO2: 97% 92% 97% 100%  Weight:      Height:        Intake/Output Summary (Last 24 hours) at 01/19/2023 1503 Last data filed at 01/19/2023 1200 Gross per 24 hour  Intake 176.67 ml  Output 2530 ml  Net -2353.33 ml   Filed Weights   01/17/23 0958 01/17/23 1223 01/18/23 0600  Weight: 90.7 kg 84 kg 82.7 kg   Physical Exam  Gen:-  Awake Alert,  in no apparent distress  HEENT:- [place].AT, No sclera icterus Neck-Supple Neck,No JVD,.  Lungs-  CTAB , fair symmetrical air movement CV- S1, S2 normal, regular , CABG scar Abd-  +ve B.Sounds, Abd Soft, No tenderness,    Extremity/Skin:- No  edema, pedal pulses present , prominent Varicose veins  Psych-affect is appropriate, oriented x3 Neuro-no new focal deficits, no tremors  Data Reviewed: I have personally reviewed following labs and imaging studies  CBC: Recent Labs  Lab 01/17/23 1001 01/18/23 0412 01/18/23 1451 01/19/23 0443  WBC 8.4 8.7  --  9.4  HGB 6.1* 7.2* 8.6* 8.9*  HCT 19.7* 22.8* 26.0* 27.3*  MCV 86.8 85.1  --  85.0  PLT 279 230  --  229   Basic Metabolic Panel: Recent Labs  Lab 01/17/23 1001 01/18/23 0412 01/19/23 0443  NA 134* 135 134*  K 3.9 3.7 3.4*  CL 100 102 100  CO2 23 23 25   GLUCOSE 135* 100* 110*  BUN 27* 23 18  CREATININE 1.36* 1.16 1.17  CALCIUM 8.5* 8.7* 8.6*   GFR: Estimated Creatinine Clearance: 52 mL/min (by C-G formula based on  SCr of 1.17 mg/dL). Liver Function Tests: Recent Labs  Lab 01/17/23 1007  AST 20  ALT 14  ALKPHOS 47  BILITOT 0.9  PROT 6.4*  ALBUMIN 3.7   Recent Results (from the past 240 hour(s))  MRSA Next Gen by PCR, Nasal     Status: None   Collection Time: 01/17/23 12:25 PM   Specimen: Nasal Mucosa; Nasal Swab  Result Value Ref Range Status   MRSA by PCR Next Gen NOT DETECTED NOT DETECTED Final    Comment: (NOTE) The GeneXpert MRSA Assay (FDA approved for NASAL specimens only), is one component of a comprehensive MRSA colonization surveillance program. It is not intended to diagnose MRSA infection nor to guide or monitor treatment for MRSA infections. Test performance is not FDA approved in patients less than 74 years old. Performed at [facility], [address], [place], [place] [address]     Radiology Studies: ECHOCARDIOGRAM COMPLETE  Result Date: 01/18/2023    ECHOCARDIOGRAM REPORT   Patient Name:   FELDER CRISCIONE Date of Exam: 01/18/2023 Medical Rec #:  [id number]       Height:       70.0 in Accession #:    [id number]      Weight:       182.3 lb Date of Birth:  05/21/1940      BSA:          2.007 m Patient Age:    81 years        BP:           132/48 mmHg Patient Gender: M               HR:           70 bpm. Exam [facility] Procedure: 2D Echo, Cardiac Doppler and Color Doppler Indications:    Abnormal [facility] R94.31  History:        Patient has prior history of Echocardiogram examinations, most                 recent 04/20/2020. CAD and Previous Myocardial Infarction, Prior                 CABG, COPD, Arrythmias:Atrial Fibrillation; Risk                 Factors:Hypertension, Dyslipidemia and Former  Smoker.  Sonographer:    Celesta Gentile RCS Referring Phys: [id number] Doyal Saric IMPRESSIONS  1. Left ventricular ejection fraction, by estimation, is 45%. The left ventricle has mildly decreased function. Left ventricular endocardial border not optimally defined to evaluate regional  wall motion. There is severe asymmetric left ventricular hypertrophy of the lateral segment. Left ventricular diastolic function could not be evaluated. There is the interventricular septum is flattened in systole and diastole, consistent with right ventricular pressure and volume overload.  2. Right ventricular systolic function is mildly reduced. The right ventricular size is mildly enlarged. There is moderately elevated pulmonary artery systolic pressure. The estimated right ventricular systolic pressure is 48.4 mmHg.  3. Left atrial size was severely dilated.  4. Right atrial size was severely dilated.  5. The mitral valve is abnormal. Mild mitral valve regurgitation. No evidence of mitral stenosis. Moderate mitral annular calcification.  6. The aortic valve is tricuspid. Aortic valve regurgitation is not visualized. Mild aortic valve stenosis. Aortic valve area, by VTI measures 1.55 cm. Aortic valve mean gradient measures 8.0 mmHg. Aortic valve Vmax measures 2.11 m/s.  7. The inferior vena cava is dilated in size with <50% respiratory variability, suggesting right atrial pressure of 15 mmHg. Comparison(s): Prior images reviewed side by side. Changes from prior study are noted. LVEF worsened from normal to 45% now. FINDINGS  Left Ventricle: Left ventricular ejection fraction, by estimation, is 45%. The left ventricle has mildly decreased function. Left ventricular endocardial border not optimally defined to evaluate regional wall motion. The left ventricular internal cavity  size was normal in size. There is severe asymmetric left ventricular hypertrophy of the lateral segment. The interventricular septum is flattened in systole and diastole, consistent with right ventricular pressure and volume overload. Left ventricular diastolic function could not be evaluated due to atrial fibrillation. Left ventricular diastolic function could not be evaluated. Right Ventricle: The right ventricular size is mildly  enlarged. No increase in right ventricular wall thickness. Right ventricular systolic function is mildly reduced. There is moderately elevated pulmonary artery systolic pressure. The tricuspid regurgitant velocity is 2.89 m/s, and with an assumed right atrial pressure of 15 mmHg, the estimated right ventricular systolic pressure is 48.4 mmHg. Left Atrium: Left atrial size was severely dilated. Right Atrium: Right atrial size was severely dilated. Pericardium: There is no evidence of pericardial effusion. Mitral Valve: The mitral valve is abnormal. Moderate mitral annular calcification. Mild mitral valve regurgitation. No evidence of mitral valve stenosis. Tricuspid Valve: The tricuspid valve is normal in structure. Tricuspid valve regurgitation is mild . No evidence of tricuspid stenosis. Aortic Valve: The aortic valve is tricuspid. Aortic valve regurgitation is not visualized. Mild aortic stenosis is present. Aortic valve mean gradient measures 8.0 mmHg. Aortic valve peak gradient measures 17.8 mmHg. Aortic valve area, by VTI measures 1.55 cm. Pulmonic Valve: The pulmonic valve was normal in structure. Pulmonic valve regurgitation is trivial. No evidence of pulmonic stenosis. Aorta: The aortic root is normal in size and structure. Venous: The inferior vena cava is dilated in size with less than 50% respiratory variability, suggesting right atrial pressure of 15 mmHg. IAS/Shunts: No atrial level shunt detected by color flow Doppler.  LEFT VENTRICLE PLAX 2D LVIDd:         5.00 cm LVIDs:         3.60 cm LV PW:         1.80 cm LV IVS:        1.30 cm LVOT diam:  1.90 cm LV SV:         70 LV SV Index:   35 LVOT Area:     2.84 cm  RIGHT VENTRICLE RV S prime:     9.69 cm/s TAPSE (M-mode): 1.9 cm LEFT ATRIUM              Index        RIGHT ATRIUM           Index LA diam:        5.40 cm  2.69 cm/m   RA Area:     27.90 cm LA Vol (A2C):   148.0 ml 73.75 ml/m  RA Volume:   91.90 ml  45.79 ml/m LA Vol (A4C):   132.0 ml  65.78 ml/m LA Biplane Vol: 140.0 ml 69.76 ml/m  AORTIC VALVE AV Area (Vmax):    1.59 cm AV Area (Vmean):   1.71 cm AV Area (VTI):     1.55 cm AV Vmax:           211.00 cm/s AV Vmean:          129.000 cm/s AV VTI:            0.454 m AV Peak Grad:      17.8 mmHg AV Mean Grad:      8.0 mmHg LVOT Vmax:         118.00 cm/s LVOT Vmean:        77.900 cm/s LVOT VTI:          0.248 m LVOT/AV VTI ratio: 0.55  AORTA Ao Root diam: 3.90 cm MITRAL VALVE                  TRICUSPID VALVE MV Area (PHT): 4.49 cm       TR Peak grad:   33.4 mmHg MV Decel Time: 169 msec       TR Vmax:        289.00 cm/s MR Peak grad:    92.5 mmHg MR Mean grad:    60.0 mmHg    SHUNTS MR Vmax:         481.00 cm/s  Systemic VTI:  0.25 m MR Vmean:        362.0 cm/s   Systemic Diam: 1.90 cm MR PISA:         0.57 cm MR PISA Eff ROA: 4 mm MR PISA Radius:  0.30 cm MV E velocity: 165.00 cm/s Vishnu Priya Mallipeddi Electronically signed by Winfield Rast Mallipeddi Signature Date/Time: 01/18/2023/3:36:05 PM    Final     Scheduled Meds:  arformoterol  15 mcg Nebulization BID   And   umeclidinium bromide  1 puff Inhalation Daily   Chlorhexidine Gluconate Cloth  6 each Topical Daily   feeding supplement  1 Container Oral TID BM   furosemide  40 mg Intravenous Daily   isosorbide mononitrate  30 mg Oral Daily   losartan  50 mg Oral Daily   pantoprazole (PROTONIX) IV  40 mg Intravenous Q12H   [START ON 01/20/2023] potassium chloride SA  20 mEq Oral BID   sodium chloride flush  3 mL Intravenous Q12H   sodium chloride flush  3 mL Intravenous Q12H   Continuous Infusions:  LOS: 2 days   Shon Hale M.D on 01/19/2023 at 3:03 PM  Go to [email or web address] - for contact info  [facility] - Office  [phone]  If 7PM-7AM, please contact night-coverage [email or web address] 01/19/2023, 3:03 PM

## 2023-01-19 NOTE — Plan of Care (Signed)
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
## 2023-01-19 NOTE — Progress Notes (Signed)
[facility] here to take patient to [facility]. Report called to Tedra Coupe, RN on 4E.

## 2023-01-20 ENCOUNTER — Inpatient Hospital Stay (HOSPITAL_COMMUNITY): Payer: 59

## 2023-01-20 DIAGNOSIS — I259 Chronic ischemic heart disease, unspecified: Secondary | ICD-10-CM

## 2023-01-20 DIAGNOSIS — K921 Melena: Secondary | ICD-10-CM | POA: Diagnosis not present

## 2023-01-20 DIAGNOSIS — K922 Gastrointestinal hemorrhage, unspecified: Secondary | ICD-10-CM | POA: Diagnosis not present

## 2023-01-20 DIAGNOSIS — D62 Acute posthemorrhagic anemia: Secondary | ICD-10-CM | POA: Diagnosis not present

## 2023-01-20 LAB — CBC
HCT: 27.3 % — ABNORMAL LOW (ref 39.0–52.0)
Hemoglobin: 8.7 g/dL — ABNORMAL LOW (ref 13.0–17.0)
MCH: 26.9 pg (ref 26.0–34.0)
MCHC: 31.9 g/dL (ref 30.0–36.0)
MCV: 84.5 fL (ref 80.0–100.0)
Platelets: 221 10*3/uL (ref 150–400)
RBC: 3.23 MIL/uL — ABNORMAL LOW (ref 4.22–5.81)
RDW: 16.1 % — ABNORMAL HIGH (ref 11.5–15.5)
WBC: 13.3 10*3/uL — ABNORMAL HIGH (ref 4.0–10.5)
nRBC: 0 % (ref 0.0–0.2)

## 2023-01-20 LAB — RENAL FUNCTION PANEL
Albumin: 3.3 g/dL — ABNORMAL LOW (ref 3.5–5.0)
Anion gap: 7 (ref 5–15)
BUN: 15 mg/dL (ref 8–23)
CO2: 23 mmol/L (ref 22–32)
Calcium: 8.7 mg/dL — ABNORMAL LOW (ref 8.9–10.3)
Chloride: 104 mmol/L (ref 98–111)
Creatinine, Ser: 1.2 mg/dL (ref 0.61–1.24)
GFR, Estimated: >60 mL/min (ref >60)
Glucose, Bld: 111 mg/dL — ABNORMAL HIGH (ref 70–99)
Phosphorus: 3.2 mg/dL (ref 2.5–4.6)
Potassium: 4 mmol/L (ref 3.5–5.1)
Sodium: 134 mmol/L — ABNORMAL LOW (ref 135–145)

## 2023-01-20 LAB — GLUCOSE, CAPILLARY
Glucose-Capillary: 116 mg/dL — ABNORMAL HIGH (ref 70–99)
Glucose-Capillary: 119 mg/dL — ABNORMAL HIGH (ref 70–99)

## 2023-01-20 MED ORDER — FUROSEMIDE 40 MG PO TABS
40.0000 mg | ORAL_TABLET | Freq: Every day | ORAL | Status: DC
  Filled 2023-01-20: qty 1

## 2023-01-20 MED ORDER — ASPIRIN 81 MG PO CHEW
81.0000 mg | CHEWABLE_TABLET | Freq: Every day | ORAL | Status: DC
  Administered 2023-01-20: 81 mg via ORAL
  Filled 2023-01-20 (×2): qty 1

## 2023-01-20 NOTE — Progress Notes (Addendum)
Patient Name: William Elliott Date of Encounter: 01/20/2023 [facility] Cardiologist: Nona Dell, MD    Interval Summary  .    Remembers his chest pain, did not have any overnight. No SOB  Vital Signs .    Vitals:   01/19/23 2017 01/19/23 2312 01/20/23 0340 01/20/23 0743  BP: (!) 119/59 (!) 131/49 (!) 100/48   Pulse:    (!) 49  Resp: 20 18 20 16   Temp:  99.2 F (37.3 C) 98.3 F (36.8 C)   TempSrc:  Oral Oral   SpO2: 91% 98%    Weight:      Height:        Intake/Output Summary (Last 24 hours) at 01/20/2023 0749 Last data filed at 01/19/2023 1757 Gross per 24 hour  Intake --  Output 950 ml  Net -950 ml      01/18/2023    6:00 AM 01/17/2023   12:23 PM 01/17/2023    9:58 AM  Last 3 Weights  Weight (lbs) 182 lb 5.1 oz 185 lb 3 oz 200 lb  Weight (kg) 82.7 kg 84 kg 90.719 kg      Latest Ref Rng & Units 01/20/2023    4:23 AM 01/19/2023    4:43 AM 01/18/2023    4:12 AM  BMP  Glucose 70 - 99 mg/dL 161  096  045   BUN 8 - 23 mg/dL 15  18  23    Creatinine 0.61 - 1.24 mg/dL 4.09  8.11  9.14   Sodium 135 - 145 mmol/L 134  134  135   Potassium 3.5 - 5.1 mmol/L 4.0  3.4  3.7   Chloride 98 - 111 mmol/L 104  100  102   CO2 22 - 32 mmol/L 23  25  23    Calcium 8.9 - 10.3 mg/dL 8.7  8.6  8.7        Telemetry/ECG    Afib, HR 50s yesterday, 40s overnight with PVC's - Personally Reviewed  ECHO: 11/18 1. Left ventricular ejection fraction, by estimation, is 45%. The left  ventricle has mildly decreased function. Left ventricular endocardial  border not optimally defined to evaluate regional wall motion. There is  severe asymmetric left ventricular  hypertrophy of the lateral segment. Left ventricular diastolic function  could not be evaluated. There is the interventricular septum is flattened  in systole and diastole, consistent with right ventricular pressure and  volume overload.   2. Right ventricular systolic function is mildly reduced. The right   ventricular size is mildly enlarged. There is moderately elevated  pulmonary artery systolic pressure. The estimated right ventricular  systolic pressure is 48.4 mmHg.   3. Left atrial size was severely dilated.   4. Right atrial size was severely dilated.   5. The mitral valve is abnormal. Mild mitral valve regurgitation. No  evidence of mitral stenosis. Moderate mitral annular calcification.   6. The aortic valve is tricuspid. Aortic valve regurgitation is not  visualized. Mild aortic valve stenosis. Aortic valve area, by VTI measures  1.55 cm. Aortic valve mean gradient measures 8.0 mmHg. Aortic valve Vmax  measures 2.11 m/s.   7. The inferior vena cava is dilated in size with <50% respiratory  variability, suggesting right atrial pressure of 15 mmHg.   Comparison(s): Prior images reviewed side by side. Changes from prior  study are noted. LVEF worsened from normal to 45% now.   Physical Exam .    General: Well developed, well nourished, male in no acute  distress Head: Eyes PERRLA, Head normocephalic and atraumatic Lungs: clear bilaterally to auscultation except few basilar rales. Heart: Irreg R&R, S1 S2, without rub or gallop. No murmur. 4/4 extremity pulses are 2+ & equal. No JVD. Abdomen: Bowel sounds are present, abdomen soft and non-tender without masses or  hernias noted. Msk: Normal strength and tone for age. Extremities: No clubbing, cyanosis or edema.    Skin:  No rashes or lesions noted. Neuro: Alert and oriented X 3. Psych:  Good affect, responds appropriately   Assessment & Plan .     NSTEMI with troponins >5000 now coming down to 2482,and EKG changes consistent with ischemia, now pain free, in the setting of GI bleed. Discussed potential cardiac catheterization but patient wants medical therapy and declines transfer and cath. New LVD EF 45% on echo, diureses. Repeat EKG still with diffuse ST depression. Will arrange close outpatient f/u in [facility] office. - not able  to do ASA 2nd GIB, BB problematic 2nd bradycardia - on Imdur 30 and losartan 50 mg, BP stable   Acute CHF-I/o's negative 4.6L but edema resolved, lung sounds improved  - wt down 6 kg - on Lasix 40 mg IV, agree w/ converting to Lasix 40 mg po every day >> done   Acute GI bleed Hbg 6.1 >> 8.9 s/p several unit PRBC's -  per GI   CAD, s/p CABG Hx graft dx and DES to SVG-OM in 2017, s/p DES to SVG-OM in 2022, occlusion of PDA and SVG diagonal, patent LIMA-LAD. Continue Imdur, Losartan.  Nexlizet has been stopped, on NTG PRN. ASA stopped due to GI bleed     Permanent A-fib, bradycardia Denies any tachycardia or palpitations. Previous monitor revealed predominant A-fib with average HR 41 bpm,  could be consistent with chronotropic incompetence and symptomatic bradycardia, frequent PVC's also noted, no pauses. No longer on BB as outpatient but ordered here 12.5 mg bid-HR in 70's but doubt he'll tolerate as OP.   Has consistently declined AC in the past.  - not interested in PPM, parameters put on BB, doubt he will get it    Mixed HLD, hx of statin intolerance, carotid artery stenosis Carotid duplex revealed 1-39% stenosis along bilateral ICA, denies any symptoms.  For questions or updates, please contact [facility] Please consult [email or web address] for contact info under        Signed, Theodore Demark, PA-C   Patient seen and examined with RB PA-C.  Agree as above, with the following exceptions and changes as noted below. Pt seen with family at bedside.  Feels well overall but curious about plans for GI workup Gen: NAD, CV: RRR, no murmurs, Lungs: clear, Abd: soft, Extrem: Warm, well perfused, no edema, Neuro/Psych: alert and oriented x 3, normal mood and affect. All available labs, radiology testing, previous records reviewed.  We discussed in detail that NSTEMI has occurred in the setting of anemia but he is currently chest pain-free, but significantly limited options for both  interventional and medical management of NSTEMI in the setting of GI bleeding.  Agree as above that while he is tolerating beta-blocker in the hospital he may not tolerate this as an outpatient as it was discontinued previously per review of medical record.  Cardiology will follow peripherally and will arrange follow-up. Parke Poisson, MD 01/20/23 10:47 AM

## 2023-01-20 NOTE — Progress Notes (Signed)
PROGRESS NOTE    William Elliott  ZOX:[id number] DOB: 11-Jan-1941 DOA: 01/17/2023 PCP: Benita Stabile, MD   Brief Narrative:  This 82 yrs old male with PMH significant for permanent atrial fibrillation not on [facility] (due to history of GI bleed), CAD s/p 4V CABG - 1999 with LIMA-LAD, SVG-DIAG, SVG-OM, SVG-RPDA and DES to SVG-OM in 2017, HTN, HLD, COPD, bilateral carotid artery stenosis, history of CVA, history of GI bleeding admitted on 01/17/2023 with chest pain-elevated troponin in the setting of GI bleed with hemoglobin down to 6.1. He was admitted for Acute GI bleed/NSTEMI  and CHF-- Cardiologist recommended endoscopic evaluation prior to starting antiplatelet in the setting of NSTEMI.  Patient is transferred from [facility] given high risk for endoscopy.  Cardiology and GI is on the board.  Assessment & Plan:   Principal Problem:   Acute GI bleeding Active Problems:   Acute blood loss anemia   Ischemic heart disease   COPD (chronic obstructive pulmonary disease) (HCC)   Essential hypertension   Coronary artery disease involving coronary bypass graft of native heart with unstable angina pectoris (HCC)   Permanent atrial fibrillation (HCC)   Diverticulosis   Lower GI bleed  Acute on chronic symptomatic anemia: Acute blood loss anemia: Patient presented with dark-colored stools, stool for occult blood+ Patient denies any abdominal pain, cannot rule out diverticular bleed. GI consulted.  Hemoglobin improved to 8.9 from 6.1 after 3 units PRBC transfusion. Baseline hemoglobin runs between 10 and 11. Patient is transferred from [name] for GI and cardio evaluation given high risk. Defer to GI team regarding timing for possible endoscopic evaluation Continue Protonix.  Chest pain consistent with NSTEMI: H/o  CAD s/p 4V CABG - 1999  Troponin 36 >>134 >>5,346> 2,482 BNP 239 EKG with atrial fibrillation rate controlled with PVCs no ACS type findings. LHC in February 2022 with  obstructive CAD, stents were placed. Patient is open to repeat left heart cath if indicated. Continue Imdur, low-dose metoprolol. Echo from 01/18/2023 with EF down to 45% from 55 to 60% back in February 2022, severe LVH noted, no mitral stenosis, mild aortic stenosis noted, there is evidence of volume overload Cardiology consult appreciated Cardiology team will like GI team to complete endoluminal evaluation prior to committing to antiplatelet therapy due to high risk of rebleeding.   Atrial fibrillation : propensity towards bradycardia (history of chronotropic incompetence). Not on Full anticoagulation due to recurrent GI bleed requiring transfusion Watch closely while on low-dose metoprolol.  History of CVA: Patient is intolerant to statins. Hold Aspirin.   Acute kidney injury Creatinine up to 1.36,  baseline 0.8-1.0 Creatinine  improved down to 1.1 renally adjust medications, avoid nephrotoxic agents / dehydration  / hypotension   Social  situation: plan of care and advanced directive discussed with patient,  patient's wife and son [name] at bedside He is Full code. Palliative care consult appreciated   COPD:-  Continue bronchodilators, no acute exacerbation at this time.   HFrEF-- acute systolic dysfunction CHF Echo with evidence of volume overload. BNP 239 Chest x-ray suggestive of pulmonary edema and trace pleural effusions Troponins elevated as above Continue IV Lasix   Disposition:-Transfer from [facility] on 01/19/2023 for further GI workup.   DVT prophylaxis: SCDs Code Status: Full code Family Communication: No family at bed side Disposition Plan:   Status is: Inpatient Remains inpatient appropriate because: Admitted for NSTEMI and GI bleed,  GI and cardiology is consulted.    Consultants:  GI Cardiology  Procedures:  Antimicrobials:  Anti-infectives (From admission, onward)    None      Subjective: Patient was seen and examined at bedside.  Overnight  events noted.  Patient denies any chest pain and shortness of breath. Son was at bedside.  He is going to have endoscopy today.  Objective: Vitals:   01/20/23 0743 01/20/23 0811 01/20/23 0929 01/20/23 1236  BP:  (!) 134/53  (!) 134/54  Pulse: (!) 49     Resp: 16 16 14 18   Temp:  98.2 F (36.8 C)  97.7 F (36.5 C)  TempSrc:  Oral  Oral  SpO2:  98% 98% 97%  Weight:      Height:        Intake/Output Summary (Last 24 hours) at 01/20/2023 1445 Last data filed at 01/20/2023 1300 Gross per 24 hour  Intake 483 ml  Output 200 ml  Net 283 ml   Filed Weights   01/17/23 0958 01/17/23 1223 01/18/23 0600  Weight: 90.7 kg 84 kg 82.7 kg    Examination:  General exam: Appears calm and comfortable, deconditioned, not in any acute distress. Respiratory system: Clear to auscultation. Respiratory effort normal.  RR 16 Cardiovascular system: S1 & S2 heard, RRR. No JVD, murmurs, rubs, gallops or clicks. No pedal edema. Gastrointestinal system: Abdomen is nondistended, soft and nontender. Normal bowel sounds heard. Central nervous system: Alert and oriented x 3. No focal neurological deficits. Extremities: No edema, no cyanosis, no clubbing. Skin: No rashes, lesions or ulcers Psychiatry: Judgement and insight appear normal. Mood & affect appropriate.     Data Reviewed: I have personally reviewed following labs and imaging studies  CBC: Recent Labs  Lab 01/17/23 1001 01/18/23 0412 01/18/23 1451 01/19/23 0443 01/20/23 0423  WBC 8.4 8.7  --  9.4 13.3*  HGB 6.1* 7.2* 8.6* 8.9* 8.7*  HCT 19.7* 22.8* 26.0* 27.3* 27.3*  MCV 86.8 85.1  --  85.0 84.5  PLT 279 230  --  229 221   Basic Metabolic Panel: Recent Labs  Lab 01/17/23 1001 01/18/23 0412 01/19/23 0443 01/20/23 0423  NA 134* 135 134* 134*  K 3.9 3.7 3.4* 4.0  CL 100 102 100 104  CO2 23 23 25 23   GLUCOSE 135* 100* 110* 111*  BUN 27* 23 18 15   CREATININE 1.36* 1.16 1.17 1.20  CALCIUM 8.5* 8.7* 8.6* 8.7*  PHOS  --   --   --   3.2   GFR: Estimated Creatinine Clearance: 50.7 mL/min (by C-G formula based on SCr of 1.2 mg/dL). Liver Function Tests: Recent Labs  Lab 01/17/23 1007 01/20/23 0423  AST 20  --   ALT 14  --   ALKPHOS 47  --   BILITOT 0.9  --   PROT 6.4*  --   ALBUMIN 3.7 3.3*   No results for input(s): "LIPASE", "AMYLASE" in the last 168 hours. No results for input(s): "AMMONIA" in the last 168 hours. Coagulation Profile: Recent Labs  Lab 01/17/23 1029  INR 1.2   Cardiac Enzymes: No results for input(s): "CKTOTAL", "CKMB", "CKMBINDEX", "TROPONINI" in the last 168 hours. BNP (last 3 results) No results for input(s): "PROBNP" in the last 8760 hours. HbA1C: No results for input(s): "HGBA1C" in the last 72 hours. CBG: Recent Labs  Lab 01/19/23 0519 01/19/23 1155 01/19/23 1814 01/20/23 0003 01/20/23 0614  GLUCAP 110* 95 124* 116* 119*   Lipid Profile: No results for input(s): "CHOL", "HDL", "LDLCALC", "TRIG", "CHOLHDL", "LDLDIRECT" in the last 72 hours. Thyroid Function Tests: No results for  input(s): "TSH", "T4TOTAL", "FREET4", "T3FREE", "THYROIDAB" in the last 72 hours. Anemia Panel: No results for input(s): "VITAMINB12", "FOLATE", "FERRITIN", "TIBC", "IRON", "RETICCTPCT" in the last 72 hours. Sepsis Labs: No results for input(s): "PROCALCITON", "LATICACIDVEN" in the last 168 hours.  Recent Results (from the past 240 hour(s))  MRSA Next Gen by PCR, Nasal     Status: None   Collection Time: 01/17/23 12:25 PM   Specimen: Nasal Mucosa; Nasal Swab  Result Value Ref Range Status   MRSA by PCR Next Gen NOT DETECTED NOT DETECTED Final    Comment: (NOTE) The GeneXpert MRSA Assay (FDA approved for NASAL specimens only), is one component of a comprehensive MRSA colonization surveillance program. It is not intended to diagnose MRSA infection nor to guide or monitor treatment for MRSA infections. Test performance is not FDA approved in patients less than 68 years old. Performed at  [facility], [address], [place], [place] [address]     Radiology Studies: DG Chest 2 View  Result Date: 01/20/2023 CLINICAL DATA:  Chest pain and dyspnea. EXAM: CHEST - 2 VIEW COMPARISON:  01/17/2023 FINDINGS: Stable enlarged cardiac silhouette. Increased prominence of the pulmonary vasculature. Decreased prominence of the interstitial markings with fewer Kerley lines. Small bilateral pleural effusions, decreased. No airspace consolidation. Stable post CABG changes. Thoracic spine degenerative changes. IMPRESSION: 1. Increased pulmonary vascular congestion. 2. Decreased interstitial pulmonary edema and small bilateral pleural effusions. 3. Stable cardiomegaly. Electronically Signed   By: Beckie Salts M.D.   On: 01/20/2023 11:31   ECHOCARDIOGRAM COMPLETE  Result Date: 01/18/2023    ECHOCARDIOGRAM REPORT   Patient Name:   KALLAN COATNEY Date of Exam: 01/18/2023 Medical Rec #:  [id number]       Height:       70.0 in Accession #:    [id number]      Weight:       182.3 lb Date of Birth:  06-10-1940      BSA:          2.007 m Patient Age:    81 years        BP:           132/48 mmHg Patient Gender: M               HR:           70 bpm. [facility] Procedure: 2D Echo, Cardiac Doppler and Color Doppler Indications:    Abnormal [facility] R94.31  History:        Patient has prior history of Echocardiogram examinations, most                 recent 04/20/2020. CAD and Previous Myocardial Infarction, Prior                 CABG, COPD, Arrythmias:Atrial Fibrillation; Risk                 Factors:Hypertension, Dyslipidemia and Former Smoker.  Sonographer:    Celesta Gentile RCS Referring Phys: [phone] [employer]RAGE [employer]MOKPAE IMPRESSIONS  1. Left ventricular ejection fraction, by estimation, is 45%. The left ventricle has mildly decreased function. Left ventricular endocardial border not optimally defined to evaluate regional wall motion. There is severe asymmetric left ventricular hypertrophy of the lateral segment.  Left ventricular diastolic function could not be evaluated. There is the interventricular septum is flattened in systole and diastole, consistent with right ventricular pressure and volume overload.  2. Right ventricular systolic function is mildly reduced. The  right ventricular size is mildly enlarged. There is moderately elevated pulmonary artery systolic pressure. The estimated right ventricular systolic pressure is 48.4 mmHg.  3. Left atrial size was severely dilated.  4. Right atrial size was severely dilated.  5. The mitral valve is abnormal. Mild mitral valve regurgitation. No evidence of mitral stenosis. Moderate mitral annular calcification.  6. The aortic valve is tricuspid. Aortic valve regurgitation is not visualized. Mild aortic valve stenosis. Aortic valve area, by VTI measures 1.55 cm. Aortic valve mean gradient measures 8.0 mmHg. Aortic valve Vmax measures 2.11 m/s.  7. The inferior vena cava is dilated in size with <50% respiratory variability, suggesting right atrial pressure of 15 mmHg. Comparison(s): Prior images reviewed side by side. Changes from prior study are noted. LVEF worsened from normal to 45% now. FINDINGS  Left Ventricle: Left ventricular ejection fraction, by estimation, is 45%. The left ventricle has mildly decreased function. Left ventricular endocardial border not optimally defined to evaluate regional wall motion. The left ventricular internal cavity  size was normal in size. There is severe asymmetric left ventricular hypertrophy of the lateral segment. The interventricular septum is flattened in systole and diastole, consistent with right ventricular pressure and volume overload. Left ventricular diastolic function could not be evaluated due to atrial fibrillation. Left ventricular diastolic function could not be evaluated. Right Ventricle: The right ventricular size is mildly enlarged. No increase in right ventricular wall thickness. Right ventricular systolic function is  mildly reduced. There is moderately elevated pulmonary artery systolic pressure. The tricuspid regurgitant velocity is 2.89 m/s, and with an assumed right atrial pressure of 15 mmHg, the estimated right ventricular systolic pressure is 48.4 mmHg. Left Atrium: Left atrial size was severely dilated. Right Atrium: Right atrial size was severely dilated. Pericardium: There is no evidence of pericardial effusion. Mitral Valve: The mitral valve is abnormal. Moderate mitral annular calcification. Mild mitral valve regurgitation. No evidence of mitral valve stenosis. Tricuspid Valve: The tricuspid valve is normal in structure. Tricuspid valve regurgitation is mild . No evidence of tricuspid stenosis. Aortic Valve: The aortic valve is tricuspid. Aortic valve regurgitation is not visualized. Mild aortic stenosis is present. Aortic valve mean gradient measures 8.0 mmHg. Aortic valve peak gradient measures 17.8 mmHg. Aortic valve area, by VTI measures 1.55 cm. Pulmonic Valve: The pulmonic valve was normal in structure. Pulmonic valve regurgitation is trivial. No evidence of pulmonic stenosis. Aorta: The aortic root is normal in size and structure. Venous: The inferior vena cava is dilated in size with less than 50% respiratory variability, suggesting right atrial pressure of 15 mmHg. IAS/Shunts: No atrial level shunt detected by color flow Doppler.  LEFT VENTRICLE PLAX 2D LVIDd:         5.00 cm LVIDs:         3.60 cm LV PW:         1.80 cm LV IVS:        1.30 cm LVOT diam:     1.90 cm LV SV:         70 LV SV Index:   35 LVOT Area:     2.84 cm  RIGHT VENTRICLE RV S prime:     9.69 cm/s TAPSE (M-mode): 1.9 cm LEFT ATRIUM              Index        RIGHT ATRIUM           Index LA diam:        5.40 cm  2.69  cm/m   RA Area:     27.90 cm LA Vol (A2C):   148.0 ml 73.75 ml/m  RA Volume:   91.90 ml  45.79 ml/m LA Vol (A4C):   132.0 ml 65.78 ml/m LA Biplane Vol: 140.0 ml 69.76 ml/m  AORTIC VALVE AV Area (Vmax):    1.59 cm AV Area  (Vmean):   1.71 cm AV Area (VTI):     1.55 cm AV Vmax:           211.00 cm/s AV Vmean:          129.000 cm/s AV VTI:            0.454 m AV Peak Grad:      17.8 mmHg AV Mean Grad:      8.0 mmHg LVOT Vmax:         118.00 cm/s LVOT Vmean:        77.900 cm/s LVOT VTI:          0.248 m LVOT/AV VTI ratio: 0.55  AORTA Ao Root diam: 3.90 cm MITRAL VALVE                  TRICUSPID VALVE MV Area (PHT): 4.49 cm       TR Peak grad:   33.4 mmHg MV Decel Time: 169 msec       TR Vmax:        289.00 cm/s MR Peak grad:    92.5 mmHg MR Mean grad:    60.0 mmHg    SHUNTS MR Vmax:         481.00 cm/s  Systemic VTI:  0.25 m MR Vmean:        362.0 cm/s   Systemic Diam: 1.90 cm MR PISA:         0.57 cm MR PISA Eff ROA: 4 mm MR PISA Radius:  0.30 cm MV E velocity: 165.00 cm/s Vishnu Priya Mallipeddi Electronically signed by Winfield Rast Mallipeddi Signature Date/Time: 01/18/2023/3:36:05 PM    Final     Scheduled Meds:  arformoterol  15 mcg Nebulization BID   And   umeclidinium bromide  1 puff Inhalation Daily   Chlorhexidine Gluconate Cloth  6 each Topical Daily   feeding supplement  1 Container Oral TID BM   furosemide  40 mg Oral Daily   isosorbide mononitrate  30 mg Oral Daily   losartan  50 mg Oral Daily   metoprolol tartrate  12.5 mg Oral BID   pantoprazole (PROTONIX) IV  40 mg Intravenous Q12H   potassium chloride SA  20 mEq Oral BID   sodium chloride flush  3 mL Intravenous Q12H   sodium chloride flush  3 mL Intravenous Q12H   Continuous Infusions:   LOS: 3 days    Time spent: 50 mins    Willeen Niece, MD [facility]   If 7PM-7AM, please contact night-coverage

## 2023-01-20 NOTE — Progress Notes (Signed)
Pt refused PO lasix, stating, "the swelling in my left leg is better than it was".   HR 52. Metoprolol not given.   Theodore Demark, PA made aware.

## 2023-01-20 NOTE — Plan of Care (Signed)
  Problem: Pain Management: Goal: General experience of comfort will improve Outcome: Progressing   Problem: Safety: Goal: Ability to remain free from injury will improve Outcome: Progressing   Problem: Skin Integrity: Goal: Risk for impaired skin integrity will decrease Outcome: Progressing

## 2023-01-20 NOTE — Care Management Important Message (Signed)
Important Message  Patient Details  Name: CAIUS CONNELLEY MRN: [id number] Date of Birth: 1940/11/08   Important Message Given:  Yes - Medicare IM     Dorena Bodo 01/20/2023, 2:25 PM

## 2023-01-20 NOTE — Care Management Important Message (Signed)
Important Message  Patient Details  Name: SHANNA RUGAR MRN: [id number] Date of Birth: [birth year]   Important Message Given:  Yes - Medicare IM     Sherilyn Banker 01/20/2023, 3:49 PM

## 2023-01-20 NOTE — Consult Note (Addendum)
Attending physician's note   I have taken a history, reviewed the chart, and examined the patient. I performed a substantive portion of this encounter, including complete performance of at least one of the key components, in conjunction with the APP. I agree with the APP's note, impression, and recommendations with my edits.   82 year old male with medical history as outlined below, transferred overnight from [facility] for additional evaluation of NSTEMI and GI bleed.  By his history, he has actually been having intermittent bleeding for the last 3 weeks or so.  No associated abdominal pain.  Was then admitted to [facility] on 11/17 with significantly elevated troponin and acute blood loss anemia with hemoglobin 6.1.  Was transfused 2 unit RBCs and has maintained stable H/H since then.  No further bleeding since Monday, but was transferred.  Was seen by Cardiology service here and was felt to be demand ischemia with recommendation for medical management.  We had an in-depth conversation with the patient and his son at bedside today.  Discussed potential etiology for bleeding, to include stuttering diverticular bleed, AVMs, ulcer, polyps, and possibly malignant bleed.  Discussed role/utility of colonoscopy at length.  Certainly carries elevated periprocedural risks, particular with regards to sedation due to his recent acute NSTEMI and underlying cardiovascular disease.  Additionally, since no bleeding in the last 2 days or so, this could be a largely diagnostic procedure, and patient and son are in agreement that risks seem to outweigh benefits at this juncture.  We also discussed restarting antiplatelet therapy with close observation, and he is in favor of this approach.  - Restart ASA 81 mg - Close observation for signs of rebleeding - Continue serial CBC checks - If any rebleeding, stat CTA and if positive, immediate consult to IR for potential selective angiography with coil embolization - Per  in-depth conversation with patient, will otherwise reserve colonoscopy for emergent/urgent therapeutic intent - GI service will continue to follow - Discussed recommendation with primary Hospital service and consulting Cardiology service   [address], [place], [place] [phone] office         Consultation  Referring Provider: TRH/Khatri Primary Care Physician:  Benita Stabile, MD Primary Gastroenterologist: Gentry Fitz, transferred from [facility]  Reason for Consultation: Subacute GI bleed  HPI: William Elliott is a 82 y.o. male who was transferred from [facility] last evening, after he had been admitted there 4 days ago.  He has history of significant coronary artery disease, status post prior CABG and drug-eluting stent 2017, not currently on chronic anticoagulation other than baby aspirin, history of permanent atrial fibrillation, hyperlipidemia, prior CVA, and history of recurrent lower GI bleeding. He had presented to the ER on 01/17/2023 with complaints of chest pain radiating to his shoulder and shortness of breath as well as some tightness in his jaw.  It had actually started the day previous, he had taken nitroglycerin at home without much benefit. In the background of that he also now says that he did started noticing blood in his stools about 3 weeks ago, initially this was bright red blood, and a significant amount.  It sounds as if he had had intermittent bleeding since that time, and did have dark red blood in his bowel movements this past weekend. No complaints of associated abdominal pain or cramping, no nausea or vomiting.  He has had several GI bleeds in the past, he did have a CTA done at [facility] in 2023 when he bled and  that showed diffuse diverticulosis and no extravasation at that time.  His son relates that he had a couple of bleeds in 2010 2012 timeframe and had undergone several colonoscopies in the past the last was greater than 10 years ago and probably done in  [place]. Also had a remote Mallory-Weiss tear perhaps 20 years ago.  Only on baby aspirin recently at home  Evaluation in the emergency room on 01/17/2023 found hemoglobin of 6.1, initial troponin was 36 then markedly elevated to 5346 x 11/18 /2024  He was transfused 2 units of packed RBCs.  Interestingly he has not had any further bowel movements or bleeding since admission on Monday  Cardiology was consulted at [facility] and recommendation was for cardiac catheterization which she initially declined and then later apparently was agreeable to and he was transferred here.  He has not had any further chest pain, today he says he is feeling fine, no shortness of breath, he is hungry  No abdominal imaging since admission  Labs today WBC 13.3/hemoglobin 8.7 stable over the past 24 hours BUN 15/creatinine 1.2  Cardiology has seen here and have diagnosed with NSTEMI.  Potential catheterization was discussed and because the MI occurred in the setting of progressive anemia this was felt to be demand ischemia and cardiology not planning catheterization at this time as options limited for both interventional and medical management.    Past Medical History:  Diagnosis Date   Acute lower GI bleeding 2008   Anxiety    Asthma    Carotid artery disease (HCC)    Asymptomatic left carotid bruit   COPD (chronic obstructive pulmonary disease) (HCC)    Coronary artery disease    a. CABG - 1999 with LIMA-LAD, SVG-DIAG, SVG-OM, SVG-RPDA  b. Cath in setting of NSTEMI 03/18/2015: patent LIMA-LAD, occluded SVG-RCA, occluded SVG-D1, 99% stenosis of SVG-OM treated w/ DES   Depression    Dyslipidemia    Erectile dysfunction    Essential hypertension    History of blood transfusion 2008   History of stroke    Hyperlipidemia    Myocardial infarction [facility])     Past Surgical History:  Procedure Laterality Date   CARDIAC CATHETERIZATION     CARDIAC CATHETERIZATION N/A 03/18/2015   Procedure: Left  Heart Cath and Cors/Grafts Angiography;  Surgeon: Marykay Lex, MD;  Location: [facility] INVASIVE CV LAB;  Service: Cardiovascular;  Laterality: N/A;   CARDIAC CATHETERIZATION N/A 03/18/2015   Procedure: Coronary Stent Intervention;  Surgeon: Marykay Lex, MD;  Location: [facility] INVASIVE CV LAB;  Service: Cardiovascular;  Laterality: N/A;   CATARACT EXTRACTION W/ INTRAOCULAR LENS  IMPLANT, BILATERAL Bilateral    CORONARY ANGIOPLASTY     CORONARY ARTERY BYPASS GRAFT  1999   CORONARY STENT INTERVENTION N/A 04/22/2020   Procedure: CORONARY STENT INTERVENTION;  Surgeon: Lyn Records, MD;  Location: [facility] INVASIVE CV LAB;  Service: Cardiovascular;  Laterality: N/A;   FRACTURE SURGERY     HERNIA REPAIR     LEFT HEART CATH AND CORS/GRAFTS ANGIOGRAPHY N/A 04/22/2020   Procedure: LEFT HEART CATH AND CORS/GRAFTS ANGIOGRAPHY;  Surgeon: Lyn Records, MD;  Location: [facility] INVASIVE CV LAB;  Service: Cardiovascular;  Laterality: N/A;   TIBIA FRACTURE SURGERY Left [facility]; took bone out of my right hip   TONSILLECTOMY     UMBILICAL HERNIA REPAIR  ~ 2000    Prior to Admission medications   Medication Sig Start Date End Date Taking? Authorizing Provider  albuterol (VENTOLIN HFA) 108 (90 Base) MCG/ACT inhaler Inhale 2 puffs into the lungs every 6 (six) hours as needed for wheezing or shortness of breath.   Yes [provider]  ALPRAZolam Prudy Feeler) 1 MG tablet Take 1 tablet (1 mg total) by mouth at bedtime as needed for anxiety. Patient taking differently: Take 1 mg by mouth 2 (two) times daily as needed for anxiety. 11/01/16  Yes Triplett, Tammy, PA-C  ascorbic acid (VITAMIN C) 500 MG tablet Take 500 mg by mouth daily.   Yes [provider]  aspirin EC 81 MG tablet Take 1 tablet (81 mg total) by mouth daily. 02/12/17  Yes Jonelle Sidle, MD  b complex vitamins tablet Take 1 tablet by mouth daily.   Yes [provider]  calcium carbonate (TUMS - DOSED IN MG ELEMENTAL CALCIUM)  500 MG chewable tablet Chew 1 tablet by mouth daily as needed for indigestion or heartburn.   Yes [provider]  Cholecalciferol (VITAMIN D3) 25 MCG (1000 UT) CAPS Take 1 capsule by mouth in the morning and at bedtime.   Yes [provider]  diphenhydrAMINE (BENADRYL) 25 mg capsule Take 50 mg by mouth every 6 (six) hours as needed for sleep.   Yes [provider]  FEROSUL 325 (65 Fe) MG tablet Take 325 mg by mouth daily. 05/23/21  Yes [provider]  furosemide (LASIX) 20 MG tablet Take 20 mg by mouth daily.   Yes [provider]  hydrochlorothiazide (HYDRODIURIL) 25 MG tablet TAKE 1 TABLET BY MOUTH EVERY DAY Patient taking differently: Take 25 mg by mouth 3 (three) times a week. 10/27/17  Yes Jonelle Sidle, MD  isosorbide mononitrate (IMDUR) 30 MG 24 hr tablet TAKE 1 TABLET BY MOUTH TWICE DAILY 03/23/22  Yes Jonelle Sidle, MD  losartan (COZAAR) 50 MG tablet Take 50 mg by mouth daily. 01/20/19  Yes [provider]  MAGNESIUM OXIDE PO Take 1 tablet by mouth daily.   Yes [provider]  multivitamin [facility]) per tablet Take 1 tablet by mouth daily.   Yes [provider]  NEXLIZET 180-10 MG TABS Take 1 tablet by mouth daily. 07/23/22  Yes [provider]  nitroGLYCERIN (NITROSTAT) 0.4 MG SL tablet place 1 tablet UNDER THE TONGUE EVERY 5 MINUTES UP TO 3 times AS NEEDED FOR CHEST pain if no RELIEF AFTER 3rd DOSE proceed TO THE ED FOR an evaluation 09/28/22  Yes Jonelle Sidle, MD  Omega 3 1000 MG CAPS Take 1 capsule by mouth daily.   Yes [provider]  pantoprazole (PROTONIX) 40 MG tablet TAKE 1 TABLET BY MOUTH EVERY DAY 02/09/19  Yes Jonelle Sidle, MD  potassium chloride SA (KLOR-CON M) 20 MEQ tablet TAKE 1 TABLET BY MOUTH TWICE DAILY 12/10/22  Yes Jonelle Sidle, MD  Tiotropium Bromide-Olodaterol (STIOLTO RESPIMAT) 2.5-2.5 MCG/ACT AERS Inhale 1 puff into the lungs 2 (two) times a day.    Yes [provider]  Zinc 50 MG CAPS Take 1 capsule by mouth daily.   Yes [provider]    Current Facility-Administered Medications  Medication Dose Route Frequency Provider Last Rate Last Admin   acetaminophen (TYLENOL) tablet 650 mg  650 mg Oral Q6H PRN Emokpae, Courage, MD   650 mg at 01/19/23 2311   Or   acetaminophen (TYLENOL) suppository 650 mg  650 mg Rectal Q6H PRN Emokpae, Courage, MD       albuterol (PROVENTIL) (2.5 MG/3ML) 0.083% nebulizer solution 2.5 mg  2.5 mg Nebulization Q2H PRN Shon Hale, MD       ALPRAZolam Prudy Feeler) tablet 1 mg  1 mg Oral BID PRN Shon Hale, MD   1 mg at 01/19/23 1736   arformoterol (BROVANA) nebulizer solution 15 mcg  15 mcg Nebulization BID Shon Hale, MD   15 mcg at 01/20/23 0743   And   umeclidinium bromide (INCRUSE ELLIPTA) 62.5 MCG/ACT 1 puff  1 puff Inhalation Daily Shon Hale, MD   1 puff at 01/19/23 0711   bisacodyl (DULCOLAX) suppository 10 mg  10 mg Rectal Daily PRN Shon Hale, MD       Chlorhexidine Gluconate Cloth 2 % PADS 6 each  6 each Topical Daily Emokpae, Courage, MD   6 each at 01/20/23 0932   feeding supplement (BOOST / RESOURCE BREEZE) liquid 1 Container  1 Container Oral TID BM Shon Hale, MD   1 Container at 01/20/23 1343   furosemide (LASIX) tablet 40 mg  40 mg Oral Daily Barrett, Rhonda G, PA-C       isosorbide mononitrate (IMDUR) 24 hr tablet 30 mg  30 mg Oral Daily Emokpae, Courage, MD   30 mg at 01/20/23 0929   losartan (COZAAR) tablet 50 mg  50 mg Oral Daily Emokpae, Courage, MD   50 mg at 01/20/23 0929   metoprolol tartrate (LOPRESSOR) tablet 12.5 mg  12.5 mg Oral BID Barrett, Rhonda G, PA-C       ondansetron (ZOFRAN) tablet 4 mg  4 mg Oral Q6H PRN Emokpae, Courage, MD       Or   ondansetron (ZOFRAN) injection 4 mg  4 mg Intravenous Q6H PRN Emokpae, Courage, MD       Oral care mouth rinse  15 mL Mouth Rinse PRN Emokpae, Courage, MD       pantoprazole (PROTONIX) injection  40 mg  40 mg Intravenous Q12H Emokpae, Courage, MD   40 mg at 01/20/23 0928   polyethylene glycol (MIRALAX / GLYCOLAX) packet 17 g  17 g Oral Daily PRN Emokpae, Courage, MD       potassium chloride SA (KLOR-CON M) CR tablet 20 mEq  20 mEq Oral BID Emokpae, Courage, MD   20 mEq at 01/20/23 0929   sodium chloride flush (NS) 0.9 % injection 3 mL  3 mL Intravenous Q12H Emokpae, Courage, MD   3 mL at 01/19/23 2126   sodium chloride flush (NS) 0.9 % injection 3 mL  3 mL Intravenous Q12H Emokpae, Courage, MD   3 mL at 01/20/23 1610   sodium chloride flush (NS) 0.9 % injection 3 mL  3 mL Intravenous PRN Emokpae, Courage, MD       traZODone (DESYREL) tablet 50 mg  50 mg Oral QHS PRN Mariea Clonts, Courage, MD   50 mg at 01/19/23 2317    Allergies as of 01/17/2023 - Review Complete 01/17/2023  Allergen Reaction Noted   Anoro ellipta [umeclidinium-vilanterol] Other (See Comments) 03/17/2015   Lipitor [atorvastatin calcium] Other (See Comments) 06/27/2010   Niaspan [niacin] Other (See Comments) 07/03/2010   Zocor [simvastatin] Other (See Comments) 07/03/2010   Fenofibrate Other (See Comments) 07/03/2010    Family History  Problem Relation Age of Onset   Heart disease Father    Heart attack Father    Hypertension Mother    Stroke Mother     Social History   Socioeconomic History   Marital status: Married    Spouse name: Not on file   Number of children: Not on file   Years  of education: Not on file   Highest education level: Not on file  Occupational History   Occupation: Welder x 10 yrs   Tobacco Use   Smoking status: Former    Types: Cigars    Quit date: 03/02/1997    Years since quitting: 25.9    Passive exposure: Never   Smokeless tobacco: Never   Tobacco comments:    smoked cigars x 10 yrs, never smoked cigarettes  Vaping Use   Vaping status: Never Used  Substance and Sexual Activity   Alcohol use: No    Alcohol/week: 0.0 standard drinks of alcohol    Comment: "drank some when I was  young; quit in the 1990s"   Drug use: No   Sexual activity: Not Currently  Other Topics Concern   Not on file  Social History Narrative   Not on file   Social Determinants of Health   Financial Resource Strain: Not on file  Food Insecurity: No Food Insecurity (01/17/2023)   Hunger Vital Sign    Worried About Running Out of Food in the Last Year: Never true    Ran Out of Food in the Last Year: Never true  Transportation Needs: No Transportation Needs (01/17/2023)   PRAPARE - Administrator, Civil Service (Medical): No    Lack of Transportation (Non-Medical): No  Physical Activity: Not on file  Stress: Not on file  Social Connections: Not on file  Intimate Partner Violence: Not At Risk (01/17/2023)   Humiliation, Afraid, Rape, and Kick questionnaire    Fear of Current or Ex-Partner: No    Emotionally Abused: No    Physically Abused: No    Sexually Abused: No    Review of Systems: Pertinent positive and negative review of systems were noted in the above HPI section.  All other review of systems was otherwise negative.   Physical Exam: Vital signs in last 24 hours: Temp:  [97.4 F (36.3 C)-99.2 F (37.3 C)] 97.4 F (36.3 C) (11/20 1623) Pulse Rate:  [49-61] 49 (11/20 0743) Resp:  [14-21] 21 (11/20 1623) BP: (95-148)/(38-80) 148/80 (11/20 1623) SpO2:  [91 %-99 %] 97 % (11/20 1623) Last BM Date : 01/18/23 General:   Alert,  Well-developed, well-nourished, elderly white male pleasant and cooperative in NAD, son at bedside Head:  Normocephalic and atraumatic. Eyes:  Sclera clear, no icterus.   Conjunctiva pink. Ears:  Normal auditory acuity. Nose:  No deformity, discharge,  or lesions. Mouth:  No deformity or lesions.   Neck:  Supple; no masses or thyromegaly. Lungs:  Clear throughout to auscultation.   No wheezes, crackles, or rhonchi.  Heart:  Regular rate and rhythm; no murmurs, clicks, rubs,  or gallops. Abdomen:  Soft,nontender, BS active,nonpalp mass or  hsm.   Rectal: Not done Msk:  Symmetrical without gross deformities. . Pulses:  Normal pulses noted. Extremities:  Without clubbing or edema.  Bruising on both upper extremities Neurologic:  Alert and  oriented x4;  grossly normal neurologically. Skin:  Intact without significant lesions or rashes.. Psych:  Alert and cooperative. Normal mood and affect.  Intake/Output from previous day: 11/19 0701 - 11/20 0700 In: -  Out: 950 [Urine:950] Intake/Output this shift: Total I/O In: 483 [P.O.:480; I.V.:3] Out: -   Lab Results: Recent Labs    01/18/23 0412 01/18/23 1451 01/19/23 0443 01/20/23 0423  WBC 8.7  --  9.4 13.3*  HGB 7.2* 8.6* 8.9* 8.7*  HCT 22.8* 26.0* 27.3* 27.3*  PLT 230  --  229 221   BMET Recent Labs    01/18/23 0412 01/19/23 0443 01/20/23 0423  NA 135 134* 134*  K 3.7 3.4* 4.0  CL 102 100 104  CO2 23 25 23   GLUCOSE 100* 110* 111*  BUN 23 18 15   CREATININE 1.16 1.17 1.20  CALCIUM 8.7* 8.6* 8.7*   LFT Recent Labs    01/20/23 0423  ALBUMIN 3.3*   PT/INR No results for input(s): "LABPROT", "INR" in the last 72 hours. Hepatitis Panel No results for input(s): "HEPBSAG", "HCVAB", "HEPAIGM", "HEPBIGM" in the last 72 hours.    IMPRESSION:  #51 82 year old white male with subacute GI bleeding over the past couple of weeks, last bowel movement 01/17/2023. Recurrent lower GI bleeding felt diverticular in etiology and this current episode is consistent with recurrent diverticular hemorrhage. He has not had colonoscopy in greater than 10 years so cannot absolutely determine that this was diverticular, he could have possible other etiology i.e. AVMs, polyp, occult neoplasm.  #2 anemia acute secondary to subacute GI blood loss, presenting with hemoglobin of 6.1. Has been transfused x 2 and hemoglobin since stable in the 8.7 range  #3 acute NSTEMI-likely demand ischemia in setting of subacute bleeding Cardiology not planning cardiac cath at this time  #4  history of coronary artery disease status post prior CABG and stents  #5 permanent 8 fibrillation #6.  Hyperlipidemia #7.  Mild acute CHF   PLAN: Full liquid diet Continue serial hemoglobins and transfuse as indicated Favor restarting baby aspirin, and observing in hospital for any evidence of recurrent bleeding. He does have recurrent active bleeding should go for stat CTA, and if CTA positive then to IR for angiography and embolization  Colonoscopy unlikely to uncover culprit lesion, and patient is definitely at high risk for complications with sedation given MI within the past 48 hours.  GI will follow with you, plans  were discussed with both the patient and his son and they are in agreement.    Amy EsterwoodPA-C  01/20/2023, 4:33 PM

## 2023-01-21 DIAGNOSIS — K922 Gastrointestinal hemorrhage, unspecified: Secondary | ICD-10-CM | POA: Diagnosis not present

## 2023-01-21 DIAGNOSIS — I25700 Atherosclerosis of coronary artery bypass graft(s), unspecified, with unstable angina pectoris: Secondary | ICD-10-CM

## 2023-01-21 DIAGNOSIS — D62 Acute posthemorrhagic anemia: Secondary | ICD-10-CM | POA: Diagnosis not present

## 2023-01-21 LAB — CBC
HCT: 27.1 % — ABNORMAL LOW (ref 39.0–52.0)
Hemoglobin: 8.7 g/dL — ABNORMAL LOW (ref 13.0–17.0)
MCH: 27.8 pg (ref 26.0–34.0)
MCHC: 32.1 g/dL (ref 30.0–36.0)
MCV: 86.6 fL (ref 80.0–100.0)
Platelets: 216 10*3/uL (ref 150–400)
RBC: 3.13 MIL/uL — ABNORMAL LOW (ref 4.22–5.81)
RDW: 16.5 % — ABNORMAL HIGH (ref 11.5–15.5)
WBC: 7.9 10*3/uL (ref 4.0–10.5)
nRBC: 0 % (ref 0.0–0.2)

## 2023-01-21 LAB — BASIC METABOLIC PANEL
Anion gap: 6 (ref 5–15)
BUN: 15 mg/dL (ref 8–23)
CO2: 22 mmol/L (ref 22–32)
Calcium: 8.8 mg/dL — ABNORMAL LOW (ref 8.9–10.3)
Chloride: 103 mmol/L (ref 98–111)
Creatinine, Ser: 1.08 mg/dL (ref 0.61–1.24)
GFR, Estimated: >60 mL/min (ref >60)
Glucose, Bld: 110 mg/dL — ABNORMAL HIGH (ref 70–99)
Potassium: 4.5 mmol/L (ref 3.5–5.1)
Sodium: 131 mmol/L — ABNORMAL LOW (ref 135–145)

## 2023-01-21 LAB — MAGNESIUM: Magnesium: 2.1 mg/dL (ref 1.7–2.4)

## 2023-01-21 LAB — HEMOGLOBIN AND HEMATOCRIT, BLOOD
HCT: 29.2 % — ABNORMAL LOW (ref 39.0–52.0)
Hemoglobin: 9.1 g/dL — ABNORMAL LOW (ref 13.0–17.0)

## 2023-01-21 LAB — PHOSPHORUS: Phosphorus: 2.9 mg/dL (ref 2.5–4.6)

## 2023-01-21 MED ORDER — ASPIRIN 81 MG PO TBEC
81.0000 mg | DELAYED_RELEASE_TABLET | Freq: Every day | ORAL | Status: DC
  Administered 2023-01-21 – 2023-01-22 (×2): 81 mg via ORAL
  Filled 2023-01-21 (×2): qty 1

## 2023-01-21 MED ORDER — ENSURE ENLIVE PO LIQD
237.0000 mL | Freq: Two times a day (BID) | ORAL | Status: DC
  Administered 2023-01-21 – 2023-01-22 (×2): 237 mL via ORAL

## 2023-01-21 MED ORDER — PANTOPRAZOLE SODIUM 40 MG PO TBEC
40.0000 mg | DELAYED_RELEASE_TABLET | Freq: Every day | ORAL | Status: DC
  Administered 2023-01-22: 40 mg via ORAL
  Filled 2023-01-21: qty 1

## 2023-01-21 NOTE — Progress Notes (Signed)
Palliative:   Chart review completed.  It seems that William Elliott has stabilized from a GI standpoint with no further GI bleeding.  He has opted to be treated medically for his heart issues.   Conference with attending, bedside nursing staff, transition of care team related to patient condition, needs, goals of care, disposition.   Plan:  Continue to treat the treatable, agreeable to attempted resuscitation.  Would benefit from outpatient palliative services for further goals of care discussions (not discussed due to transfer).  Anticipate return home via EMS due to mobility issues/WC on 11/22.  Follow up out patient as scheduled.       No charge Lillia Carmel, NP Palliative medicine team Team phone [phone]

## 2023-01-21 NOTE — Progress Notes (Addendum)
Nutrition Brief Note  Pt's diet advanced to full liquids then heart healthy Today. Pt endorses good app/PO intake. He has been eating most of his meals, 90% documented this morning. Pt states drinking his Boost Breeze and so far has had 1 Ensure today due to diet advancement. Pt is willing to continue Ensure and would like it twice a day. He also is willing to have strawberry greek yogurt as a snack. He reports no N/V and no blood in stool. Last bowel movement was 11/20.   Per MD, if pt remains stable can be D/C tomorrow.   INTERVENTION:    Discontinue Boost Breeze Provide Ensure Enlive BID, each supplement provides 350 kcal and 20 grams of protein.   If nutrition issues arise, please consult RD.   Elliot Dally, RD Registered Dietitian  See Amion for more information

## 2023-01-21 NOTE — Progress Notes (Signed)
PROGRESS NOTE    William Elliott  IHK:[id number] DOB: 05/23/1940 DOA: 01/17/2023 PCP: Benita Stabile, MD   Brief Narrative:  This 82 yrs old male with PMH significant for permanent atrial fibrillation not on [facility] (due to history of GI bleed), CAD s/p 4V CABG - 1999 with LIMA-LAD, SVG-DIAG, SVG-OM, SVG-RPDA and DES to SVG-OM in 2017, HTN, HLD, COPD, bilateral carotid artery stenosis, history of CVA, history of GI bleeding admitted on 01/17/2023 with chest pain-elevated troponin in the setting of GI bleed with hemoglobin down to 6.1. He was admitted for Acute GI bleed/NSTEMI  and CHF-- Cardiologist recommended endoscopic evaluation prior to starting antiplatelet in the setting of NSTEMI.  Patient is transferred from [facility] given high risk for endoscopy.  Cardiology and GI is on the board.  Assessment & Plan:   Principal Problem:   Acute GI bleeding Active Problems:   Acute blood loss anemia   Ischemic heart disease   COPD (chronic obstructive pulmonary disease) (HCC)   Essential hypertension   Coronary artery disease involving coronary bypass graft of native heart with unstable angina pectoris (HCC)   Permanent atrial fibrillation (HCC)   Diverticulosis   Lower GI bleed   Hematochezia  Acute on chronic symptomatic anemia: Acute blood loss anemia: Patient presented with dark-colored stools, stool for occult blood+ Patient denies any abdominal pain, cannot rule out diverticular bleed. GI consulted.  Hemoglobin improved to 8.9 from 6.1 after 3 units PRBC transfusion. Baseline hemoglobin runs between 10 and 11. Patient is transferred from [facility] for GI and cardio evaluation given high risk. Defer to GI team regarding timing for possible endoscopic evaluation. Bleeding felt secondary to diverticular bleed which is now resolved. Given NSTEMI GI therefore do not plan on pursuing any endoscopic evaluation at this time. Continue Protonix 40 mg p.o. twice daily. If H/H remains stable  patient can be discharged home tomorrow.  Chest pain consistent with NSTEMI: H/o  CAD s/p 4V CABG - 1999  Troponin 36 >>134 >>5,346> 2,482 BNP 239 EKG with atrial fibrillation rate controlled with PVCs no ACS type findings. LHC in February 2022 with obstructive CAD, stents were placed. Patient is open to repeat left heart cath if indicated. Continue Imdur, low-dose metoprolol. Echo from 01/18/2023 with EF down to 45% from 55 to 60% back in February 2022, severe LVH noted, no mitral stenosis, mild aortic stenosis noted, there is evidence of volume overload Cardiology consult appreciated, patient started on aspirin 81 mg daily. Cardiology not planning further cardiac workup at this time.   Atrial fibrillation : propensity towards bradycardia (history of chronotropic incompetence). Not on Full anticoagulation due to recurrent GI bleed requiring transfusion Watch closely while on low-dose metoprolol.  History of CVA: Patient is intolerant to statins. Resumed Aspirin.   Acute kidney injury > Resolved. Creatinine up to 1.36,  baseline 0.8-1.0 Creatinine  improved down to 1.1 renally adjust medications, avoid nephrotoxic agents / dehydration  / hypotension.   Social  situation: plan of care and advanced directive discussed with patient,  patient's wife and son [name] at bedside He is Full code. Palliative care consult appreciated   COPD:-  Continue bronchodilators, no acute exacerbation at this time.  HFrEF-- acute systolic dysfunction CHF Echo with evidence of volume overload. BNP 239 Chest x-ray suggestive of pulmonary edema and trace pleural effusions Troponins elevated as above Continue IV Lasix   Disposition:-Transfer from [facility] on 01/19/2023 for further GI workup.   DVT prophylaxis: SCDs Code Status: Full code Family  Communication: No family at bed side Disposition Plan:   Status is: Inpatient Remains inpatient appropriate because: Admitted for NSTEMI and GI bleed,  GI  and cardiology is consulted. No plan for any endoscopy at this time, bleeding most likely diverticular which is now resolved.  Cardiology not planning any workup at this time.    Consultants:  GI Cardiology  Procedures:  Antimicrobials:  Anti-infectives (From admission, onward)    None      Subjective: Patient was seen and examined at bedside.  Overnight events noted.   Patient denies any chest pain or shortness of breath. Patient has not had a bowel movement since yesterday but denies any further bleeding.  Objective: Vitals:   01/20/23 2334 01/21/23 0446 01/21/23 0846 01/21/23 1232  BP: (!) 124/98 (!) 145/63 135/62 (!) 99/50  Pulse: (!) 59 (!) 52 61 (!) 52  Resp: 16 19 16 19   Temp: 98 F (36.7 C) 97.9 F (36.6 C) 98.5 F (36.9 C) 98.1 F (36.7 C)  TempSrc: Oral Oral Oral Oral  SpO2: 98% 98% 96% 96%  Weight:      Height:        Intake/Output Summary (Last 24 hours) at 01/21/2023 1327 Last data filed at 01/21/2023 1000 Gross per 24 hour  Intake 360 ml  Output 250 ml  Net 110 ml   Filed Weights   01/17/23 0958 01/17/23 1223 01/18/23 0600  Weight: 90.7 kg 84 kg 82.7 kg    Examination:  General exam: Appears comfortable, deconditioned, not in any acute distress. Respiratory system: Clear to auscultation. Respiratory effort normal.  RR 14 Cardiovascular system: S1 & S2 heard, RRR. No JVD, murmurs, rubs, gallops or clicks. No pedal edema. Gastrointestinal system: Abdomen is nondistended, soft and nontender. Normal bowel sounds heard. Central nervous system: Alert and oriented x 3. No focal neurological deficits. Extremities: No edema, no cyanosis, no clubbing. Skin: No rashes, lesions or ulcers Psychiatry: Judgement and insight appear normal. Mood & affect appropriate.     Data Reviewed: I have personally reviewed following labs and imaging studies  CBC: Recent Labs  Lab 01/17/23 1001 01/18/23 0412 01/18/23 1451 01/19/23 0443 01/20/23 0423  01/21/23 0351 01/21/23 1209  WBC 8.4 8.7  --  9.4 13.3* 7.9  --   HGB 6.1* 7.2* 8.6* 8.9* 8.7* 8.7* 9.1*  HCT 19.7* 22.8* 26.0* 27.3* 27.3* 27.1* 29.2*  MCV 86.8 85.1  --  85.0 84.5 86.6  --   PLT 279 230  --  229 221 216  --    Basic Metabolic Panel: Recent Labs  Lab 01/17/23 1001 01/18/23 0412 01/19/23 0443 01/20/23 0423 01/21/23 0351  NA 134* 135 134* 134* 131*  K 3.9 3.7 3.4* 4.0 4.5  CL 100 102 100 104 103  CO2 23 23 25 23 22   GLUCOSE 135* 100* 110* 111* 110*  BUN 27* 23 18 15 15   CREATININE 1.36* 1.16 1.17 1.20 1.08  CALCIUM 8.5* 8.7* 8.6* 8.7* 8.8*  MG  --   --   --   --  2.1  PHOS  --   --   --  3.2 2.9   GFR: Estimated Creatinine Clearance: 56.3 mL/min (by C-G formula based on SCr of 1.08 mg/dL). Liver Function Tests: Recent Labs  Lab 01/17/23 1007 01/20/23 0423  AST 20  --   ALT 14  --   ALKPHOS 47  --   BILITOT 0.9  --   PROT 6.4*  --   ALBUMIN 3.7 3.3*   No  results for input(s): "LIPASE", "AMYLASE" in the last 168 hours. No results for input(s): "AMMONIA" in the last 168 hours. Coagulation Profile: Recent Labs  Lab 01/17/23 1029  INR 1.2   Cardiac Enzymes: No results for input(s): "CKTOTAL", "CKMB", "CKMBINDEX", "TROPONINI" in the last 168 hours. BNP (last 3 results) No results for input(s): "PROBNP" in the last 8760 hours. HbA1C: No results for input(s): "HGBA1C" in the last 72 hours. CBG: Recent Labs  Lab 01/19/23 0519 01/19/23 1155 01/19/23 1814 01/20/23 0003 01/20/23 0614  GLUCAP 110* 95 124* 116* 119*   Lipid Profile: No results for input(s): "CHOL", "HDL", "LDLCALC", "TRIG", "CHOLHDL", "LDLDIRECT" in the last 72 hours. Thyroid Function Tests: No results for input(s): "TSH", "T4TOTAL", "FREET4", "T3FREE", "THYROIDAB" in the last 72 hours. Anemia Panel: No results for input(s): "VITAMINB12", "FOLATE", "FERRITIN", "TIBC", "IRON", "RETICCTPCT" in the last 72 hours. Sepsis Labs: No results for input(s): "PROCALCITON",  "LATICACIDVEN" in the last 168 hours.  Recent Results (from the past 240 hour(s))  MRSA Next Gen by PCR, Nasal     Status: None   Collection Time: 01/17/23 12:25 PM   Specimen: Nasal Mucosa; Nasal Swab  Result Value Ref Range Status   MRSA by PCR Next Gen NOT DETECTED NOT DETECTED Final    Comment: (NOTE) The GeneXpert MRSA Assay (FDA approved for NASAL specimens only), is one component of a comprehensive MRSA colonization surveillance program. It is not intended to diagnose MRSA infection nor to guide or monitor treatment for MRSA infections. Test performance is not FDA approved in patients less than 68 years old. Performed at [facility], [address], [place], [place] [address]     Radiology Studies: DG Chest 2 View  Result Date: 01/20/2023 CLINICAL DATA:  Chest pain and dyspnea. EXAM: CHEST - 2 VIEW COMPARISON:  01/17/2023 FINDINGS: Stable enlarged cardiac silhouette. Increased prominence of the pulmonary vasculature. Decreased prominence of the interstitial markings with fewer Kerley lines. Small bilateral pleural effusions, decreased. No airspace consolidation. Stable post CABG changes. Thoracic spine degenerative changes. IMPRESSION: 1. Increased pulmonary vascular congestion. 2. Decreased interstitial pulmonary edema and small bilateral pleural effusions. 3. Stable cardiomegaly. Electronically Signed   By: Beckie Salts M.D.   On: 01/20/2023 11:31    Scheduled Meds:  arformoterol  15 mcg Nebulization BID   And   umeclidinium bromide  1 puff Inhalation Daily   aspirin EC  81 mg Oral Daily   feeding supplement  1 Container Oral TID BM   furosemide  40 mg Oral Daily   isosorbide mononitrate  30 mg Oral Daily   losartan  50 mg Oral Daily   metoprolol tartrate  12.5 mg Oral BID   pantoprazole  40 mg Oral Q0600   potassium chloride SA  20 mEq Oral BID   sodium chloride flush  3 mL Intravenous Q12H   sodium chloride flush  3 mL Intravenous Q12H   Continuous Infusions:   LOS:  4 days    Time spent: 35 mins    Willeen Niece, MD [facility]   If 7PM-7AM, please contact night-coverage

## 2023-01-21 NOTE — Progress Notes (Addendum)
Patient ID: William Elliott, male   DOB: 07/08/1940, 82 y.o.   MRN: [id number]     Attending physician's note   I have taken a history, reviewed the chart, and examined the patient. I performed a substantive portion of this encounter, including complete performance of at least one of the key components, in conjunction with the APP. I agree with the APP's note, impression, and recommendations with my edits.   William Castrillon, DO, FACG [phone] office          Progress Note   Subjective   Day # 5 CC; subacute GI bleed/acute NSTEMI  Baby aspirin restarted yesterday WBC 7.9/hemoglobin 8.7/hematocrit 27.1 very stable Sodium 131/potassium 4.5/BUN 15/creatinine 1.08  Patient is sitting up on the side of the bed eating breakfast, no bowel movement since yesterday morning, stool at that time was brown. No complaints of chest pain or shortness of breath, no abdominal discomfort.   Objective   Vital signs in last 24 hours: Temp:  [97.4 F (36.3 C)-98.5 F (36.9 C)] 98.5 F (36.9 C) (11/21 0846) Pulse Rate:  [52-63] 61 (11/21 0846) Resp:  [16-22] 16 (11/21 0846) BP: (124-148)/(49-98) 135/62 (11/21 0846) SpO2:  [96 %-98 %] 96 % (11/21 0846) Last BM Date : 01/20/23 General:    Elderly white male, son at bedside Heart:  Regular rate and rhythm; no murmurs Lungs: Respirations even and unlabored, lungs CTA bilaterally Abdomen:  Soft, nontender and nondistended. Normal bowel sounds. Extremities:  Without edema. Neurologic:  Alert and oriented,  grossly normal neurologically. Psych:  Cooperative. Normal mood and affect.  Intake/Output from previous day: 11/20 0701 - 11/21 0700 In: 723 [P.O.:720; I.V.:3] Out: 250 [Urine:250] Intake/Output this shift: No intake/output data recorded.  Lab Results: Recent Labs    01/19/23 0443 01/20/23 0423 01/21/23 0351  WBC 9.4 13.3* 7.9  HGB 8.9* 8.7* 8.7*  HCT 27.3* 27.3* 27.1*  PLT 229 221 216   BMET Recent Labs     01/19/23 0443 01/20/23 0423 01/21/23 0351  NA 134* 134* 131*  K 3.4* 4.0 4.5  CL 100 104 103  CO2 25 23 22   GLUCOSE 110* 111* 110*  BUN 18 15 15   CREATININE 1.17 1.20 1.08  CALCIUM 8.6* 8.7* 8.8*   LFT Recent Labs    01/20/23 0423  ALBUMIN 3.3*   PT/INR No results for input(s): "LABPROT", "INR" in the last 72 hours.  Studies/Results: DG Chest 2 View  Result Date: 01/20/2023 CLINICAL DATA:  Chest pain and dyspnea. EXAM: CHEST - 2 VIEW COMPARISON:  01/17/2023 FINDINGS: Stable enlarged cardiac silhouette. Increased prominence of the pulmonary vasculature. Decreased prominence of the interstitial markings with fewer Kerley lines. Small bilateral pleural effusions, decreased. No airspace consolidation. Stable post CABG changes. Thoracic spine degenerative changes. IMPRESSION: 1. Increased pulmonary vascular congestion. 2. Decreased interstitial pulmonary edema and small bilateral pleural effusions. 3. Stable cardiomegaly. Electronically Signed   By: Beckie Salts M.D.   On: 01/20/2023 11:31       Assessment / Plan:    #86- 32 year-old white male with history of recurrent lower GI bleeding felt to be diverticular in etiology, who presented to [facility] with subacute bleeding noting intermittent blood in his stools and at times larger amounts over the past couple of weeks prior to admission. Last bowel movement 01/17/2023, he did have a bowel movement yesterday which was brown  Bleeding felt secondary to stuttering diverticular bleed which has now resolved Not had a colonoscopy in greater than 10 years  however he did have an acute NSTEMI this admission and therefore do not plan on pursuing any endoscopic evaluation at this time  #2 anemia acute secondary to subacute GI blood loss initially presented with hemoglobin 6.1 currently stable at 8.7  #3 acute NSTEMI likely demand ischemia in setting of subacute bleeding and anemia Cardiology not planning further cardiac workup at this  time  Baby aspirin resumed yesterday  #4 history of coronary artery disease status post prior CABG and prior stents #5 permanent atrial fibrillation #6.  Mild acute CHF   Plan; continue serial hemoglobins every 12 hours, transfuse for hemoglobin less than 8 given comorbidities and acute MI earlier this week DC IV Protonix, will change to Protonix 40 mg p.o. every morning Continue baby aspirin 1 daily Advance to heart healthy diet If blood counts remain very stable, plan will be to discharge home tomorrow  (Patient and son asking if he can be transported home and his son does not have a way to get him into the home, needs wheelchair etc.)    Principal Problem:   Acute GI bleeding Active Problems:   Ischemic heart disease   COPD (chronic obstructive pulmonary disease) (HCC)   Essential hypertension   Coronary artery disease involving coronary bypass graft of native heart with unstable angina pectoris (HCC)   Permanent atrial fibrillation (HCC)   Acute blood loss anemia   Diverticulosis   Lower GI bleed   Hematochezia     LOS: 4 days   Amy Esterwood PA-C 01/21/2023, 11:15 AM

## 2023-01-22 DIAGNOSIS — K922 Gastrointestinal hemorrhage, unspecified: Secondary | ICD-10-CM | POA: Diagnosis not present

## 2023-01-22 DIAGNOSIS — D62 Acute posthemorrhagic anemia: Secondary | ICD-10-CM | POA: Diagnosis not present

## 2023-01-22 LAB — HEMOGLOBIN AND HEMATOCRIT, BLOOD
HCT: 25.9 % — ABNORMAL LOW (ref 39.0–52.0)
HCT: 29.1 % — ABNORMAL LOW (ref 39.0–52.0)
Hemoglobin: 8.1 g/dL — ABNORMAL LOW (ref 13.0–17.0)
Hemoglobin: 9.1 g/dL — ABNORMAL LOW (ref 13.0–17.0)

## 2023-01-22 LAB — CBC
HCT: 28.2 % — ABNORMAL LOW (ref 39.0–52.0)
Hemoglobin: 8.8 g/dL — ABNORMAL LOW (ref 13.0–17.0)
MCH: 27.3 pg (ref 26.0–34.0)
MCHC: 31.2 g/dL (ref 30.0–36.0)
MCV: 87.6 fL (ref 80.0–100.0)
Platelets: 231 10*3/uL (ref 150–400)
RBC: 3.22 MIL/uL — ABNORMAL LOW (ref 4.22–5.81)
RDW: 16.9 % — ABNORMAL HIGH (ref 11.5–15.5)
WBC: 9.1 10*3/uL (ref 4.0–10.5)
nRBC: 0 % (ref 0.0–0.2)

## 2023-01-22 LAB — BASIC METABOLIC PANEL
Anion gap: 9 (ref 5–15)
BUN: 18 mg/dL (ref 8–23)
CO2: 24 mmol/L (ref 22–32)
Calcium: 9 mg/dL (ref 8.9–10.3)
Chloride: 102 mmol/L (ref 98–111)
Creatinine, Ser: 1.26 mg/dL — ABNORMAL HIGH (ref 0.61–1.24)
GFR, Estimated: 58 mL/min — ABNORMAL LOW (ref >60)
Glucose, Bld: 113 mg/dL — ABNORMAL HIGH (ref 70–99)
Potassium: 4.6 mmol/L (ref 3.5–5.1)
Sodium: 135 mmol/L (ref 135–145)

## 2023-01-22 MED ORDER — METOPROLOL TARTRATE 25 MG PO TABS: 12.5000 mg | ORAL_TABLET | Freq: Two times a day (BID) | ORAL | 0 refills | Status: DC

## 2023-01-22 MED ORDER — FUROSEMIDE 40 MG PO TABS: 40.0000 mg | ORAL_TABLET | Freq: Every day | ORAL | 0 refills | Status: DC

## 2023-01-22 NOTE — Progress Notes (Addendum)
Patient ID: William Elliott, male   DOB: [birth year], 82 y.o.   MRN: [id number]     Attending physician's note   I have reviewed the chart and discussed his care on rounds. I performed a substantive portion of this encounter, including complete performance of at least one of the key components, in conjunction with the APP. I agree with the APP's note, impression, and recommendations with my edits.   William Forrester, DO, FACG [phone] office          Progress Note   Subjective   Day #6 CC: Subacute GI bleed/acute NSTEMI  Baby aspirin resumed 01/20/2023  Hemoglobin this a.m. quite stable at 9.1/hematocrit 29.1-not required any transfusions since transfer to [facility] has not had any evidence of ongoing bleeding  Tolerating solid diet and looking forward to discharge    Objective   Vital signs in last 24 hours: Temp:  [97.8 F (36.6 C)-98.8 F (37.1 C)] 98.8 F (37.1 C) (11/22 0853) Pulse Rate:  [50-89] 89 (11/22 0853) Resp:  [15-20] 16 (11/22 0853) BP: (119-143)/(52-71) 143/58 (11/22 0853) SpO2:  [95 %-100 %] 100 % (11/22 0853) Last BM Date : 01/21/23 General:    Elderly white male in NAD Heart:  iRegular rate and rhythm; no murmurs Lungs: Respirations even and unlabored, lungs CTA bilaterally Abdomen:  Soft, nontender and nondistended. Normal bowel sounds. Extremities:  Without edema. Neurologic:  Alert and oriented,  grossly normal neurologically. Psych:  Cooperative. Normal mood and affect.  Intake/Output from previous day: 11/21 0701 - 11/22 0700 In: 360 [P.O.:360] Out: -  Intake/Output this shift: Total I/O In: 120 [P.O.:120] Out: -   Lab Results: Recent Labs    01/20/23 0423 01/21/23 0351 01/21/23 1209 01/21/23 2333 01/22/23 0405 01/22/23 1059  WBC 13.3* 7.9  --   --  9.1  --   HGB 8.7* 8.7*   < > 8.1* 8.8* 9.1*  HCT 27.3* 27.1*   < > 25.9* 28.2* 29.1*  PLT 221 216  --   --  231  --    < > = values in this interval not displayed.    BMET Recent Labs    01/20/23 0423 01/21/23 0351 01/22/23 0405  NA 134* 131* 135  K 4.0 4.5 4.6  CL 104 103 102  CO2 23 22 24   GLUCOSE 111* 110* 113*  BUN 15 15 18   CREATININE 1.20 1.08 1.26*  CALCIUM 8.7* 8.8* 9.0   LFT Recent Labs    01/20/23 0423  ALBUMIN 3.3*   PT/INR No results for input(s): "LABPROT", "INR" in the last 72 hours.     Assessment / Plan:    #26 82 year old white male with history of recurrent lower GI bleeding felt diverticular in etiology, initially admitted to [facility] 6 days ago with subacute bleeding.  Patient had been noting intermittent bright red blood and then bloody stools over 2 weeks prior to admission No evidence of any GI bleeding since 01/17/2023  Last colonoscopy was greater than 10 years ago  Bleeding felt secondary to stuttering diverticular bleed which has resolved  Is not a candidate for colonoscopy right now as he just had an MI earlier in the week  #2 acute NSTEMI felt secondary to demand ischemia in setting of subacute bleeding and anemia  #3 history of coronary artery disease status post prior stents and prior CABG-had only been on baby aspirin prior to admission  #4 permanent atrial fibrillation #5.  Mild acute CHF resolved  Plan; clear  for discharge from GI perspective Leave him on Protonix 40 mg p.o. every morning long-term Baby aspirin 1 daily Follow-up with his PCP for repeat labs in 1 week and GI follow-up will be with [facility]/Dr. Levon Hedger    Principal Problem:   Acute GI bleeding Active Problems:   Ischemic heart disease   COPD (chronic obstructive pulmonary disease) (HCC)   Essential hypertension   Coronary artery disease involving coronary bypass graft of native heart with unstable angina pectoris (HCC)   Permanent atrial fibrillation (HCC)   Acute blood loss anemia   Diverticulosis   Lower GI bleed   Hematochezia     LOS: 5 days   Amy Esterwood PA-C 01/22/2023, 12:40 PM

## 2023-01-22 NOTE — TOC Transition Note (Signed)
Transition of Care (TOC) - CM/SW Discharge Note Donn Pierini RN, BSN Transitions of Care Unit 4E- RN Case Manager See Treatment Team for direct phone #   Patient Details  Name: William Elliott MRN: [id number] Date of Birth: 1940/11/15  Transition of Care [facility]) CM/SW Contact:  Darrold Span, RN Phone Number: 01/22/2023, 11:58 AM   Clinical Narrative:    Pt stable for transition home today, son to transport home.  DME- rollator arranged- pt/son did not have a preference for provider- [employer] called for DME referral- rollator to be delivered to room prior to discharge.   No further TOC needs noted.    Final next level of care: Home/Self Care Barriers to Discharge: Barriers Resolved   Patient Goals and CMS Choice CMS Medicare.gov Compare Post Acute Care list provided to:: Patient Choice offered to / list presented to : Patient, Adult Children  Discharge Placement               Home          Discharge Plan and Services Additional resources added to the After Visit Summary for     Discharge Planning Services: CM Consult Post Acute Care Choice: Durable Medical Equipment          DME Arranged: Walker rolling with seat DME Agency: [facility] Date DME Agency Contacted: 01/22/23 Time DME Agency Contacted: 1030 Representative spoke with at DME Agency: Zollie Beckers HH Arranged: NA HH Agency: NA        Social Determinants of Health (SDOH) Interventions SDOH Screenings   Food Insecurity: No Food Insecurity (01/17/2023)  Housing: Patient Declined (01/17/2023)  Transportation Needs: No Transportation Needs (01/17/2023)  Utilities: Not At Risk (01/17/2023)  Tobacco Use: Medium Risk (01/19/2023)     Readmission Risk Interventions    01/22/2023   11:58 AM  Readmission Risk Prevention Plan  Post Dischage Appt Complete  Medication Screening Complete  Transportation Screening Complete

## 2023-01-22 NOTE — Discharge Instructions (Signed)
Advised to follow-up with primary care physician in 1 week. Advised to continue follow-up with cardiology and gastroenterology as scheduled. Advised to take aspirin 81 mg daily.

## 2023-01-22 NOTE — Discharge Summary (Addendum)
Physician Discharge Summary  William Elliott JSE:[id number] DOB: 1940-03-16 DOA: 01/17/2023  PCP: William Stabile, MD  Admit date: 01/17/2023  Discharge date: 01/22/2023  Admitted From: Home  Disposition:Home.  Recommendations for Outpatient Follow-up:  Follow up with PCP in 1-2 weeks. Please obtain BMP/CBC in one week Advised to Follow-up with Cardiology and gastroenterology as scheduled. Advised to take aspirin 81 mg daily. Resume losartan once renal functions improved.  Home Health:None Equipment/Devices:None  Discharge Condition: Stable CODE STATUS:Full code Diet recommendation: Heart Healthy  Brief [facility] Course: This 82 yrs old male with PMH significant for permanent atrial fibrillation not on C[facility] - [place] (due to history of GI bleed), CAD s/p 4V CABG - 1999 with LIMA-LAD, SVG-DIAG, SVG-OM, SVG-RPDA and DES to SVG-OM in 2017, HTN, HLD, COPD, bilateral carotid artery stenosis, history of CVA, history of GI bleeding admitted on 01/17/2023 with chest pain-elevated troponin in the setting of GI bleed with hemoglobin down to 6.1. He was admitted for Acute GI bleed/NSTEMI  and CHF-- Cardiologist recommended endoscopic evaluation prior to starting antiplatelet in the setting of NSTEMI.  Patient was transferred from [facility] given high risk for endoscopy.Cardiology and GI is on the board.  Bleeding was thought secondary to diverticular bleed which has now resolved. Gastroenterology deferred doing any endoscopic evaluation until emergent situation.H&H remains stable after 3 units of PRBC was given at [facility].  Cardiologist has started aspirin 81 mg daily.  Patient has tolerated well.  H&H remains stable, no any further evidence of bleeding.  Cardiologist not planning any further cardiac workup at this time.  GI signed off , states patient can be discharged on Protonix.  Cardiology signed off,  recommended outpatient follow-up with.  Renal functions improved.  Patient feels better and  wants to be discharged,  home health services arranged.  Patient being discharged home.  Discharge Diagnoses:  Principal Problem:   Acute GI bleeding Active Problems:   Acute blood loss anemia   Ischemic heart disease   COPD (chronic obstructive pulmonary disease) (HCC)   Essential hypertension   Coronary artery disease involving coronary bypass graft of native heart with unstable angina pectoris (HCC)   Permanent atrial fibrillation (HCC)   Diverticulosis   Lower GI bleed   Hematochezia  Acute on chronic symptomatic anemia: Acute blood loss anemia: Patient presented with dark-colored stools, stool for occult blood+ Patient denies any abdominal pain, cannot rule out diverticular bleed. GI consulted.  Hemoglobin improved to 8.9 from 6.1 after 3 units PRBC transfusion. Baseline hemoglobin runs between 10 and 11. Patient is transferred from [facility] for GI and cardio evaluation given high risk. Defer to GI team regarding timing for possible endoscopic evaluation. Bleeding felt secondary to diverticular bleed which is now resolved. Given NSTEMI GI therefore do not plan on pursuing any endoscopic evaluation at this time. Continue Protonix 40 mg p.o. twice daily. If H/H remains stable,  patient can be discharged home.   Chest pain consistent with NSTEMI: H/o  CAD s/p 4V CABG - 1999  Troponin 36 >>134 >>5,346> 2,482 BNP 239 EKG with atrial fibrillation rate controlled with PVCs no ACS type findings. LHC in February 2022 with obstructive CAD, stents were placed. Patient is open to repeat left heart cath if indicated. Continue Imdur, low-dose metoprolol. Echo from 01/18/2023 with EF down to 45% from 55 to 60% back in February 2022, severe LVH noted, no mitral stenosis, mild aortic stenosis noted, there is evidence of volume overload Cardiology consult appreciated, patient started on aspirin 81  mg daily. Cardiology not planning further cardiac workup at this time.   Atrial fibrillation  : propensity towards bradycardia (history of chronotropic incompetence). Not on Full anticoagulation due to recurrent GI bleed requiring transfusion Watch closely while on low-dose metoprolol.   History of CVA: Patient is intolerant to statins. Resumed Aspirin.   Acute kidney injury > Resolved. Creatinine up to 1.36,  baseline 0.8-1.0 Creatinine  improved down to 1.1 renally adjust medications, avoid nephrotoxic agents / dehydration  / hypotension.   Social  situation: plan of care and advanced directive discussed with patient,  patient's wife and son William Elliott at bedside He is Full code. Palliative care consult appreciated   COPD:-  Continue bronchodilators, no acute exacerbation at this time.   HFrEF-- acute systolic dysfunction CHF Echo with evidence of volume overload. BNP 239 Chest x-ray suggestive of pulmonary edema and trace pleural effusions Troponins elevated as above Continue IV Lasix, changed to oral lasix.   Disposition:-Transfer from [facility] on 01/19/2023 for further GI workup. DC home 01/22/23  Discharge Instructions  Discharge Instructions     Call MD for:  difficulty breathing, headache or visual disturbances   Complete by: As directed    Call MD for:  persistant dizziness or light-headedness   Complete by: As directed    Call MD for:  persistant nausea and vomiting   Complete by: As directed    Diet - low sodium heart healthy   Complete by: As directed    Diet Carb Modified   Complete by: As directed    Discharge instructions   Complete by: As directed    Advised to follow-up with primary care physician in 1 week. Advised to continue follow-up with cardiology and gastroenterology as scheduled. Advised to take aspirin 81 mg daily.   Increase activity slowly   Complete by: As directed       Allergies as of 01/22/2023       Reactions   Anoro Ellipta [umeclidinium-vilanterol] Other (See Comments)   Cardiac problems   Lipitor [atorvastatin Calcium] Other  (See Comments)   Not known   Niaspan [niacin] Other (See Comments)   Not known   Zocor [simvastatin] Other (See Comments)   Not known   Fenofibrate Other (See Comments)   No energy        Medication List     STOP taking these medications    hydrochlorothiazide 25 MG tablet Commonly known as: HYDRODIURIL   losartan 50 MG tablet Commonly known as: COZAAR       TAKE these medications    albuterol 108 (90 Base) MCG/ACT inhaler Commonly known as: VENTOLIN HFA Inhale 2 puffs into the lungs every 6 (six) hours as needed for wheezing or shortness of breath.   ALPRAZolam 1 MG tablet Commonly known as: XANAX Take 1 tablet (1 mg total) by mouth at bedtime as needed for anxiety. What changed: when to take this   ascorbic acid 500 MG tablet Commonly known as: VITAMIN C Take 500 mg by mouth daily.   aspirin EC 81 MG tablet Take 1 tablet (81 mg total) by mouth daily.   b complex vitamins tablet Take 1 tablet by mouth daily.   calcium carbonate 500 MG chewable tablet Commonly known as: TUMS - dosed in mg elemental calcium Chew 1 tablet by mouth daily as needed for indigestion or heartburn.   diphenhydrAMINE 25 mg capsule Commonly known as: BENADRYL Take 50 mg by mouth every 6 (six) hours as needed for sleep.   FeroSul  325 (65 Fe) MG tablet Generic drug: ferrous sulfate Take 325 mg by mouth daily.   furosemide 40 MG tablet Commonly known as: LASIX Take 1 tablet (40 mg total) by mouth daily. Start taking on: January 23, 2023 What changed:  medication strength how much to take   isosorbide mononitrate 30 MG 24 hr tablet Commonly known as: IMDUR TAKE 1 TABLET BY MOUTH TWICE DAILY   MAGNESIUM OXIDE PO Take 1 tablet by mouth daily.   metoprolol tartrate 25 MG tablet Commonly known as: LOPRESSOR Take 0.5 tablets (12.5 mg total) by mouth 2 (two) times daily.   multivitamin per tablet Take 1 tablet by mouth daily.   Nexlizet 180-10 MG Tabs Generic drug:  Bempedoic Acid-Ezetimibe Take 1 tablet by mouth daily.   nitroGLYCERIN 0.4 MG SL tablet Commonly known as: NITROSTAT place 1 tablet UNDER THE TONGUE EVERY 5 MINUTES UP TO 3 times AS NEEDED FOR CHEST pain if no RELIEF AFTER 3rd DOSE proceed TO THE ED FOR an evaluation   Omega 3 1000 MG Caps Take 1 capsule by mouth daily.   pantoprazole 40 MG tablet Commonly known as: PROTONIX TAKE 1 TABLET BY MOUTH EVERY DAY   potassium chloride SA 20 MEQ tablet Commonly known as: KLOR-CON M TAKE 1 TABLET BY MOUTH TWICE DAILY   Stiolto Respimat 2.5-2.5 MCG/ACT Aers Generic drug: Tiotropium Bromide-Olodaterol Inhale 1 puff into the lungs 2 (two) times a day.   Vitamin D3 25 MCG (1000 UT) Caps Take 1 capsule by mouth in the morning and at bedtime.   Zinc 50 MG Caps Take 1 capsule by mouth daily.               Durable Medical Equipment  (From admission, onward)           Start     Ordered   01/21/23 1425  For home use only DME 4 wheeled rolling walker with seat  Once       Question:  Patient needs a walker to treat with the following condition  Answer:  Weakness generalized   01/21/23 1424            Follow-up Information     [facility] [employer] Follow up.   Why: Rollator arranged- to be delivered to room Contact information: [address] [place] [address] [phone]         William Stabile, MD Follow up in 1 week(s).   Specialty: Internal Medicine Contact information: [address] [place] [address] [phone]                Allergies  Allergen Reactions   Anoro Ellipta [Umeclidinium-Vilanterol] Other (See Comments)    Cardiac problems   Lipitor [Atorvastatin Calcium] Other (See Comments)    Not known    Niaspan [Niacin] Other (See Comments)    Not known   Zocor [Simvastatin] Other (See Comments)    Not known   Fenofibrate Other (See Comments)    No energy     Consultations: Cardiology Gastroenterology   Procedures/Studies: DG Chest 2 View  Result Date: 01/20/2023 CLINICAL DATA:  Chest pain and dyspnea. EXAM: CHEST - 2 VIEW COMPARISON:  01/17/2023 FINDINGS: Stable enlarged cardiac silhouette. Increased prominence of the pulmonary vasculature. Decreased prominence of the interstitial markings with fewer Kerley lines. Small bilateral pleural effusions, decreased. No airspace consolidation. Stable post CABG changes. Thoracic spine degenerative changes. IMPRESSION: 1. Increased pulmonary vascular congestion. 2. Decreased interstitial pulmonary edema and small bilateral pleural effusions.  3. Stable cardiomegaly. Electronically Signed   By: Beckie Salts M.D.   On: 01/20/2023 11:31   ECHOCARDIOGRAM COMPLETE  Result Date: 01/18/2023    ECHOCARDIOGRAM REPORT   Patient Name:   TRICE SCHMOCK Date of Exam: 01/18/2023 Medical Rec #:  [id number]       Height:       70.0 in Accession #:    [id number]      Weight:       182.3 lb Date of Birth:  10-17-1940      BSA:          2.007 m Patient Age:    81 years        BP:           132/48 mmHg Patient Gender: M               HR:           70 bpm. [facility] Procedure: 2D Echo, Cardiac Doppler and Color Doppler Indications:    Abnormal [facility] R94.31  History:        Patient has prior history of Echocardiogram examinations, most                 recent 04/20/2020. CAD and Previous Myocardial Infarction, Prior                 CABG, COPD, Arrythmias:Atrial Fibrillation; Risk                 Factors:Hypertension, Dyslipidemia and Former Smoker.  Sonographer:    Celesta Gentile RCS Referring Phys: [phone] COURAGE EMOKPAE IMPRESSIONS  1. Left ventricular ejection fraction, by estimation, is 45%. The left ventricle has mildly decreased function. Left ventricular endocardial border not optimally defined to evaluate regional wall motion. There is severe asymmetric left ventricular hypertrophy of the lateral segment. Left  ventricular diastolic function could not be evaluated. There is the interventricular septum is flattened in systole and diastole, consistent with right ventricular pressure and volume overload.  2. Right ventricular systolic function is mildly reduced. The right ventricular size is mildly enlarged. There is moderately elevated pulmonary artery systolic pressure. The estimated right ventricular systolic pressure is 48.4 mmHg.  3. Left atrial size was severely dilated.  4. Right atrial size was severely dilated.  5. The mitral valve is abnormal. Mild mitral valve regurgitation. No evidence of mitral stenosis. Moderate mitral annular calcification.  6. The aortic valve is tricuspid. Aortic valve regurgitation is not visualized. Mild aortic valve stenosis. Aortic valve area, by VTI measures 1.55 cm. Aortic valve mean gradient measures 8.0 mmHg. Aortic valve Vmax measures 2.11 m/s.  7. The inferior vena cava is dilated in size with <50% respiratory variability, suggesting right atrial pressure of 15 mmHg. Comparison(s): Prior images reviewed side by side. Changes from prior study are noted. LVEF worsened from normal to 45% now. FINDINGS  Left Ventricle: Left ventricular ejection fraction, by estimation, is 45%. The left ventricle has mildly decreased function. Left ventricular endocardial border not optimally defined to evaluate regional wall motion. The left ventricular internal cavity  size was normal in size. There is severe asymmetric left ventricular hypertrophy of the lateral segment. The interventricular septum is flattened in systole and diastole, consistent with right ventricular pressure and volume overload. Left ventricular diastolic function could not be evaluated due to atrial fibrillation. Left ventricular diastolic function could not be evaluated. Right Ventricle: The right ventricular size is mildly enlarged. No increase in right ventricular wall  thickness. Right ventricular systolic function is mildly  reduced. There is moderately elevated pulmonary artery systolic pressure. The tricuspid regurgitant velocity is 2.89 m/s, and with an assumed right atrial pressure of 15 mmHg, the estimated right ventricular systolic pressure is 48.4 mmHg. Left Atrium: Left atrial size was severely dilated. Right Atrium: Right atrial size was severely dilated. Pericardium: There is no evidence of pericardial effusion. Mitral Valve: The mitral valve is abnormal. Moderate mitral annular calcification. Mild mitral valve regurgitation. No evidence of mitral valve stenosis. Tricuspid Valve: The tricuspid valve is normal in structure. Tricuspid valve regurgitation is mild . No evidence of tricuspid stenosis. Aortic Valve: The aortic valve is tricuspid. Aortic valve regurgitation is not visualized. Mild aortic stenosis is present. Aortic valve mean gradient measures 8.0 mmHg. Aortic valve peak gradient measures 17.8 mmHg. Aortic valve area, by VTI measures 1.55 cm. Pulmonic Valve: The pulmonic valve was normal in structure. Pulmonic valve regurgitation is trivial. No evidence of pulmonic stenosis. Aorta: The aortic root is normal in size and structure. Venous: The inferior vena cava is dilated in size with less than 50% respiratory variability, suggesting right atrial pressure of 15 mmHg. IAS/Shunts: No atrial level shunt detected by color flow Doppler.  LEFT VENTRICLE PLAX 2D LVIDd:         5.00 cm LVIDs:         3.60 cm LV PW:         1.80 cm LV IVS:        1.30 cm LVOT diam:     1.90 cm LV SV:         70 LV SV Index:   35 LVOT Area:     2.84 cm  RIGHT VENTRICLE RV S prime:     9.69 cm/s TAPSE (M-mode): 1.9 cm LEFT ATRIUM              Index        RIGHT ATRIUM           Index LA diam:        5.40 cm  2.69 cm/m   RA Area:     27.90 cm LA Vol (A2C):   148.0 ml 73.75 ml/m  RA Volume:   91.90 ml  45.79 ml/m LA Vol (A4C):   132.0 ml 65.78 ml/m LA Biplane Vol: 140.0 ml 69.76 ml/m  AORTIC VALVE AV Area (Vmax):    1.59 cm AV Area  (Vmean):   1.71 cm AV Area (VTI):     1.55 cm AV Vmax:           211.00 cm/s AV Vmean:          129.000 cm/s AV VTI:            0.454 m AV Peak Grad:      17.8 mmHg AV Mean Grad:      8.0 mmHg LVOT Vmax:         118.00 cm/s LVOT Vmean:        77.900 cm/s LVOT VTI:          0.248 m LVOT/AV VTI ratio: 0.55  AORTA Ao Root diam: 3.90 cm MITRAL VALVE                  TRICUSPID VALVE MV Area (PHT): 4.49 cm       TR Peak grad:   33.4 mmHg MV Decel Time: 169 msec       TR Vmax:        289.00  cm/s MR Peak grad:    92.5 mmHg MR Mean grad:    60.0 mmHg    SHUNTS MR Vmax:         481.00 cm/s  Systemic VTI:  0.25 m MR Vmean:        362.0 cm/s   Systemic Diam: 1.90 cm MR PISA:         0.57 cm MR PISA Eff ROA: 4 mm MR PISA Radius:  0.30 cm MV E velocity: 165.00 cm/s Vishnu Priya Mallipeddi Electronically signed by Winfield Rast Mallipeddi Signature Date/Time: 01/18/2023/3:36:05 PM    Final    DG Chest Port 1 View  Result Date: 01/17/2023 CLINICAL DATA:  Atrial fibrillation and chest pain EXAM: PORTABLE CHEST 1 VIEW COMPARISON:  Chest radiograph dated 04/19/2020 FINDINGS: Normal lung volumes. Severe bilateral interstitial opacities. Trace blunting of the bilateral costophrenic angles. No pneumothorax. Similar enlarged cardiomediastinal silhouette. Median sternotomy wires are nondisplaced. IMPRESSION: 1. Severe bilateral interstitial opacities, likely pulmonary edema. 2. Trace blunting of the bilateral costophrenic angles, which may represent trace pleural effusions. 3. Similar cardiomegaly. Electronically Signed   By: Agustin Cree M.D.   On: 01/17/2023 12:31    Subjective: Patient was seen and examined at bedside.  Overnight events noted.   Patient reports doing much better.  Wants to be discharged.  Denies any further bleeding.  Discharge Exam: Vitals:   01/22/23 0349 01/22/23 0853  BP: (!) 133/59 (!) 143/58  Pulse: (!) 56 89  Resp: 16 16  Temp: 97.8 F (36.6 C) 98.8 F (37.1 C)  SpO2: 100% 100%   Vitals:    01/21/23 1955 01/21/23 2318 01/22/23 0349 01/22/23 0853  BP:  (!) 137/55 (!) 133/59 (!) 143/58  Pulse: (!) 54 (!) 50 (!) 56 89  Resp: 15 20 16 16   Temp:  97.9 F (36.6 C) 97.8 F (36.6 C) 98.8 F (37.1 C)  TempSrc:  Oral Oral Oral  SpO2: 95% 100% 100% 100%  Weight:      Height:        General: Pt is alert, awake, not in acute distress Cardiovascular: RRR, S1/S2 +, no rubs, no gallops Respiratory: CTA bilaterally, no wheezing, no rhonchi Abdominal: Soft, NT, ND, bowel sounds + Extremities: no edema, no cyanosis    The results of significant diagnostics from this hospitalization (including imaging, microbiology, ancillary and laboratory) are listed below for reference.     Microbiology: Recent Results (from the past 240 hour(s))  MRSA Next Gen by PCR, Nasal     Status: None   Collection Time: 01/17/23 12:25 PM   Specimen: Nasal Mucosa; Nasal Swab  Result Value Ref Range Status   MRSA by PCR Next Gen NOT DETECTED NOT DETECTED Final    Comment: (NOTE) The GeneXpert MRSA Assay (FDA approved for NASAL specimens only), is one component of a comprehensive MRSA colonization surveillance program. It is not intended to diagnose MRSA infection nor to guide or monitor treatment for MRSA infections. Test performance is not FDA approved in patients less than 35 years old. Performed at [facility], [address], [place], [place] [address]      Labs: BNP (last 3 results) Recent Labs    01/17/23 1007  BNP 239.0*   Basic Metabolic Panel: Recent Labs  Lab 01/18/23 0412 01/19/23 0443 01/20/23 0423 01/21/23 0351 01/22/23 0405  NA 135 134* 134* 131* 135  K 3.7 3.4* 4.0 4.5 4.6  CL 102 100 104 103 102  CO2 23 25 23 22 24   GLUCOSE  100* 110* 111* 110* 113*  BUN 23 18 15 15 18   CREATININE 1.16 1.17 1.20 1.08 1.26*  CALCIUM 8.7* 8.6* 8.7* 8.8* 9.0  MG  --   --   --  2.1  --   PHOS  --   --  3.2 2.9  --    Liver Function Tests: Recent Labs  Lab 01/17/23 1007 01/20/23 0423   AST 20  --   ALT 14  --   ALKPHOS 47  --   BILITOT 0.9  --   PROT 6.4*  --   ALBUMIN 3.7 3.3*   No results for input(s): "LIPASE", "AMYLASE" in the last 168 hours. No results for input(s): "AMMONIA" in the last 168 hours. CBC: Recent Labs  Lab 01/18/23 0412 01/18/23 1451 01/19/23 0443 01/20/23 0423 01/21/23 0351 01/21/23 1209 01/21/23 2333 01/22/23 0405 01/22/23 1059  WBC 8.7  --  9.4 13.3* 7.9  --   --  9.1  --   HGB 7.2*   < > 8.9* 8.7* 8.7* 9.1* 8.1* 8.8* 9.1*  HCT 22.8*   < > 27.3* 27.3* 27.1* 29.2* 25.9* 28.2* 29.1*  MCV 85.1  --  85.0 84.5 86.6  --   --  87.6  --   PLT 230  --  229 221 216  --   --  231  --    < > = values in this interval not displayed.   Cardiac Enzymes: No results for input(s): "CKTOTAL", "CKMB", "CKMBINDEX", "TROPONINI" in the last 168 hours. BNP: Invalid input(s): "POCBNP" CBG: Recent Labs  Lab 01/19/23 0519 01/19/23 1155 01/19/23 1814 01/20/23 0003 01/20/23 0614  GLUCAP 110* 95 124* 116* 119*   D-Dimer No results for input(s): "DDIMER" in the last 72 hours. Hgb A1c No results for input(s): "HGBA1C" in the last 72 hours. Lipid Profile No results for input(s): "CHOL", "HDL", "LDLCALC", "TRIG", "CHOLHDL", "LDLDIRECT" in the last 72 hours. Thyroid function studies No results for input(s): "TSH", "T4TOTAL", "T3FREE", "THYROIDAB" in the last 72 hours.  Invalid input(s): "FREET3" Anemia work up No results for input(s): "VITAMINB12", "FOLATE", "FERRITIN", "TIBC", "IRON", "RETICCTPCT" in the last 72 hours. Urinalysis    Component Value Date/Time   COLORURINE YELLOW 12/07/2015 0545   APPEARANCEUR CLEAR 12/07/2015 0545   LABSPEC 1.010 12/07/2015 0545   PHURINE 6.0 12/07/2015 0545   GLUCOSEU NEGATIVE 12/07/2015 0545   HGBUR NEGATIVE 12/07/2015 0545   BILIRUBINUR NEGATIVE 12/07/2015 0545   KETONESUR NEGATIVE 12/07/2015 0545   PROTEINUR NEGATIVE 12/07/2015 0545   NITRITE NEGATIVE 12/07/2015 0545   LEUKOCYTESUR NEGATIVE 12/07/2015  0545   Sepsis Labs Recent Labs  Lab 01/19/23 0443 01/20/23 0423 01/21/23 0351 01/22/23 0405  WBC 9.4 13.3* 7.9 9.1   Microbiology Recent Results (from the past 240 hour(s))  MRSA Next Gen by PCR, Nasal     Status: None   Collection Time: 01/17/23 12:25 PM   Specimen: Nasal Mucosa; Nasal Swab  Result Value Ref Range Status   MRSA by PCR Next Gen NOT DETECTED NOT DETECTED Final    Comment: (NOTE) The GeneXpert MRSA Assay (FDA approved for NASAL specimens only), is one component of a comprehensive MRSA colonization surveillance program. It is not intended to diagnose MRSA infection nor to guide or monitor treatment for MRSA infections. Test performance is not FDA approved in patients less than 20 years old. Performed at [facility], [address], [place], [place] [address]      Time coordinating discharge: Over 30 minutes  SIGNED:   Alverda Skeans  Idelle Leech, MD  [facility] 01/22/2023, 2:46 PM Pager   If 7PM-7AM, please contact night-coverage

## 2023-01-22 NOTE — Progress Notes (Signed)
Pt discharging home with family.  Rollator delivered to room.  All instructions given and reviewed, all questions answered.

## 2023-01-25 ENCOUNTER — Other Ambulatory Visit: Payer: Self-pay | Admitting: Cardiology

## 2023-02-03 DIAGNOSIS — B356 Tinea cruris: Secondary | ICD-10-CM | POA: Diagnosis not present

## 2023-02-03 DIAGNOSIS — I252 Old myocardial infarction: Secondary | ICD-10-CM | POA: Diagnosis not present

## 2023-02-03 DIAGNOSIS — R001 Bradycardia, unspecified: Secondary | ICD-10-CM | POA: Diagnosis not present

## 2023-02-03 DIAGNOSIS — I4891 Unspecified atrial fibrillation: Secondary | ICD-10-CM | POA: Diagnosis not present

## 2023-02-03 DIAGNOSIS — N179 Acute kidney failure, unspecified: Secondary | ICD-10-CM | POA: Diagnosis not present

## 2023-02-03 DIAGNOSIS — K922 Gastrointestinal hemorrhage, unspecified: Secondary | ICD-10-CM | POA: Diagnosis not present

## 2023-02-03 DIAGNOSIS — I509 Heart failure, unspecified: Secondary | ICD-10-CM | POA: Diagnosis not present

## 2023-02-12 ENCOUNTER — Encounter: Payer: Self-pay | Admitting: Nurse Practitioner

## 2023-02-12 ENCOUNTER — Ambulatory Visit: Payer: 59 | Attending: Nurse Practitioner | Admitting: Nurse Practitioner

## 2023-02-12 VITALS — BP 120/68 | HR 60 | Ht 70.0 in | Wt 185.0 lb

## 2023-02-12 DIAGNOSIS — E782 Mixed hyperlipidemia: Secondary | ICD-10-CM

## 2023-02-12 DIAGNOSIS — Z789 Other specified health status: Secondary | ICD-10-CM | POA: Diagnosis not present

## 2023-02-12 DIAGNOSIS — I4821 Permanent atrial fibrillation: Secondary | ICD-10-CM | POA: Diagnosis not present

## 2023-02-12 DIAGNOSIS — I5020 Unspecified systolic (congestive) heart failure: Secondary | ICD-10-CM

## 2023-02-12 DIAGNOSIS — I25119 Atherosclerotic heart disease of native coronary artery with unspecified angina pectoris: Secondary | ICD-10-CM

## 2023-02-12 DIAGNOSIS — I25110 Atherosclerotic heart disease of native coronary artery with unstable angina pectoris: Secondary | ICD-10-CM

## 2023-02-12 DIAGNOSIS — R001 Bradycardia, unspecified: Secondary | ICD-10-CM | POA: Diagnosis not present

## 2023-02-12 DIAGNOSIS — I5022 Chronic systolic (congestive) heart failure: Secondary | ICD-10-CM

## 2023-02-12 DIAGNOSIS — I2510 Atherosclerotic heart disease of native coronary artery without angina pectoris: Secondary | ICD-10-CM | POA: Diagnosis not present

## 2023-02-12 DIAGNOSIS — I6523 Occlusion and stenosis of bilateral carotid arteries: Secondary | ICD-10-CM

## 2023-02-12 NOTE — Patient Instructions (Addendum)
Medication Instructions:  Your physician recommends that you continue on your current medications as directed. Please refer to the Current Medication list given to you today.  Labwork: In 3 weeks at [facility]   Testing/Procedures: None   Follow-Up: Your physician recommends that you schedule a follow-up appointment in: 6 weeks   Any Other Special Instructions Will Be Listed Below (If Applicable).  If you need a refill on your cardiac medications before your next appointment, please call your pharmacy.

## 2023-02-12 NOTE — Progress Notes (Unsigned)
Office Visit    Patient Name: William Elliott Date of Encounter: 02/12/2023 PCP:  Benita Stabile, MD [facility]  Cardiologist:  Nona Dell, MD  Advanced Practice Provider:  Sharlene Dory, NP Electrophysiologist:  None   Chief Complaint    EFTON Elliott is a 82 y.o. male with a hx of CAD, s/p CABG and drug-eluting stent to SVG to OM in 2017, permanent A-fib, mixed hyperlipidemia, history of lower GI bleed, bradycardia, HTN, dyslipidemia, and past history of stroke, who presents today for follow-up.  History of CABG and drug-eluting stent to SVG to OM 2017, also received drug-eluting stent to SVG to OM in 2022 with occlusion of SVG to PDA and SVG to diagonal, LIMA to LAD was patent.    Last seen by Dr. Diona Browner on November 19, 2021.  He was doing well at the time, was working on weight loss.  10/09/2022 - Today he presents for follow-up. At last visit, HR was found to be 45, asymptomatic. Metoprolol was stopped. Presents today for follow-up. Doing well, does admit some dizziness after taking his morning medications, goes away later in the day. BP is stable per his report. Denies any acute cardiac complaints or issues. Denies any chest pain, shortness of breath, palpitations, syncope, presyncope, orthopnea, PND, swelling or significant weight changes, acute bleeding, or claudication.  01/12/2023 - Was doing well. Says he is only taking Amlodipine PRN. Denies any chest pain, shortness of breath, palpitations, syncope, presyncope, dizziness, orthopnea, PND, swelling or significant weight changes, acute bleeding, or claudication.   02/12/2023 -he presents today for follow-up.  Doing well and denies any recurrent bleeding episodes.  He confirms that he has returned to taking aspirin 81 mg daily, denies any bleeding issues.  Tolerating Lopressor well.  Does admit to recent edema in bilateral lower extremities. Denies any chest pain, shortness of breath, palpitations,  syncope, presyncope, dizziness, orthopnea, PND, significant weight changes, acute bleeding, or claudication.  SH: Wife is William Elliott, another patient of mine, have been married for close to 60 years.   EKGs/Labs/Other Studies Reviewed:   The following studies were reviewed today:  EKG:  EKG is not ordered today.    Echo 01/2023:  1. Left ventricular ejection fraction, by estimation, is 45%. The left  ventricle has mildly decreased function. Left ventricular endocardial  border not optimally defined to evaluate regional wall motion. There is  severe asymmetric left ventricular  hypertrophy of the lateral segment. Left ventricular diastolic function  could not be evaluated. There is the interventricular septum is flattened in systole and diastole, consistent with right ventricular pressure and volume overload.   2. Right ventricular systolic function is mildly reduced. The right  ventricular size is mildly enlarged. There is moderately elevated  pulmonary artery systolic pressure. The estimated right ventricular  systolic pressure is 48.4 mmHg.   3. Left atrial size was severely dilated.   4. Right atrial size was severely dilated.   5. The mitral valve is abnormal. Mild mitral valve regurgitation. No  evidence of mitral stenosis. Moderate mitral annular calcification.   6. The aortic valve is tricuspid. Aortic valve regurgitation is not  visualized. Mild aortic valve stenosis. Aortic valve area, by VTI measures 1.55 cm. Aortic valve mean gradient measures 8.0 mmHg. Aortic valve Vmax measures 2.11 m/s.   7. The inferior vena cava is dilated in size with <50% respiratory  variability, suggesting right atrial pressure of 15 mmHg.   Comparison(s): Prior images reviewed  side by side. Changes from prior  study are noted. LVEF worsened from normal to 45% now. Carotid duplex 11/2022:  Summary:  Right Carotid: Velocities in the right ICA are consistent with a 1-39%  stenosis.  Non-hemodynamically significant plaque <50% noted in the  CCA. The ECA appears <50% stenosed.   Left Carotid: Velocities in the left ICA are consistent with a 1-39%  stenosis. Non-hemodynamically significant plaque <50% noted in the  CCA. The ECA appears <50% stenosed.   Vertebrals:  Bilateral vertebral arteries demonstrate antegrade flow.  Subclavians: Normal flow hemodynamics were seen in bilateral subclavian arteries.  Cardiac monitor 10/2022:  ZIO monitor reviewed.  6 days, 3 hours analyzed.   Predominant rhythm is atrial fibrillation with heart rate ranging from 30 bpm up to 75 bpm and average heart rate 41 bpm.  In the right clinical context, could be consistent with chronotropic incompetence and symptomatic bradycardia. There were frequent PVCs representing 21.6% total beats.  Otherwise rare ventricular couplets and triplets were noted as well as limited episodes of ventricular bigeminy and trigeminy. No pauses.  LHC 04/2020:  A stent was successfully placed.   99% mid body stenosis in SVG to obtuse marginal reduced to 10% with TIMI grade III flow using a 4.0 x 22 Onyx deployed at 12 atm.  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 (The previously placed ostial to proximal saphenous vein to OM stent overhangs into the ascending aorta preventing engagement with extra-support guide catheters.  An internal mammary guide catheter was used to coaxially aim and advanced the guidewire into the graft). Chronic occlusion of SVG to PDA. Chronic occlusion of SVG to diagonal. Patent LIMA to the mid to distal LAD.  Supplies collaterals to the PDA. Total occlusion of the mid LAD.  95% ostial LAD stenosis with only septal perforator and a small diagonal at risk. Total occlusion of the mid circumflex Total occlusion of the mid RCA.  PDA is supplied by LAD collaterals when the LIMA graft to the LAD is visualized. Echocardiogram documents preserved LV systolic  function, EF greater than 50%.  LVEDP is normal, 7 mmHg.   RECOMMENDATIONS:   Aspirin and Plavix for 12 months. If decision to use anticoagulation for management of atrial fibrillation is made, would recommend discontinuing aspirin after 1 month and continuing Plavix to complete 12 months  Echo 04/2020:  1. Left ventricular ejection fraction, by estimation, is 55 to 60%. The  left ventricle has normal function. The left ventricle demonstrates  regional wall motion abnormalities (see scoring diagram/findings for  description). There is severe left  ventricular hypertrophy. Left ventricular diastolic function could not be  evaluated. There is moderate hypokinesis of the left ventricular,  basal-mid inferior wall.   2. Right ventricular systolic function is low normal. The right  ventricular size is normal. Tricuspid regurgitation signal is inadequate  for assessing PA pressure.   3. Left atrial size was severely dilated.   4. Right atrial size was mildly dilated.   5. Moderate pleural effusion in the left lateral region.   6. The mitral valve is abnormal. Trivial mitral valve regurgitation.   7. The aortic valve is tricuspid. Aortic valve regurgitation is not  visualized. Mild aortic valve sclerosis is present, with no evidence of  aortic valve stenosis. Aortic valve mean gradient measures 6.0 mmHg.   8. The inferior vena cava is dilated in size with <50% respiratory  variability, suggesting right atrial pressure of 15 mmHg.  Comparison(s): Changes from prior study are noted. 12/09/15: LVEF 65-70%,  normal wall motion.  Risk Assessment/Calculations:   CHA2DS2-VASc Score = 6  This indicates a 9.7% annual risk of stroke. The patient's score is based upon: CHF History: 0 HTN History: 1 Diabetes History: 0 Stroke History: 2 Vascular Disease History: 1 Age Score: 2 Gender Score: 0  Review of Systems    All other systems reviewed and are otherwise negative except as noted  above.  Physical Exam    VS:  BP 120/68   Pulse 60   Ht 5\' 10"  (1.778 m)   Wt 185 lb (83.9 kg)   SpO2 100%   BMI 26.54 kg/m  , BMI Body mass index is 26.54 kg/m.  Wt Readings from Last 3 Encounters:  02/12/23 185 lb (83.9 kg)  01/18/23 182 lb 5.1 oz (82.7 kg)  01/12/23 194 lb 4.8 oz (88.1 kg)     GEN: Well nourished, well developed, in no acute distress. HEENT: normal. Neck: Supple, no JVD, L carotid bruit and no R carotid bruit, no masses. Cardiac: S1/S2, irregular rhythm and regular rate, no murmurs, rubs, or gallops. No clubbing, cyanosis.  Nonpitting edema to BLE.  Radials/PT 2+ and equal bilaterally.  Respiratory:  Respirations regular and unlabored, clear to auscultation bilaterally. MS: No deformity or atrophy. Skin: Warm and dry, no rash. Neuro:  Strength and sensation are intact. Psych: Normal affect.  Assessment & Plan    HFmrEF Stage C, NYHA class I symptoms.  Most recent echocardiogram revealed EF 45%, severe asymmetric left ventricle hypertrophy with findings consistent with right ventricular pressure and volume overload.  Moderately elevated PASP with estimated right ventricular systolic pressure at 48.4 mmHg. Euvolemic and well compensated on exam, does have some minimal leg edema on exam, recommended compression stockings.  Will obtain BMET and CBC prior to adjusting medications.  Continue current GDMT at this time. Low sodium diet, fluid restriction <2L, and daily weights encouraged. Educated to contact our office for weight gain of 2 lbs overnight or 5 lbs in one week.   CAD, s/p CABG Hx graft dx and DES to SVG-OM in 2017, s/p DES to SVG-OM in 2022, occlusion of PDA and SVG diagonal, patent LIMA-LAD. Stable with no anginal symptoms. No indication for ischemic evaluation. Continue Aspirin, Imdur, Losartan, Nexlizet, and NTG PRN.  Heart healthy diet and regular cardiovascular exercise encouraged.   2. Permanent A-fib, bradycardia Denies any tachycardia or  palpitations. Previous monitor revealed predominant A-fib with average HR 41 bpm. Was noted that in right clinical context, could be consistent with chronotropic incompetence and symptomatic bradycardia, frequent PVC's also noted, no pauses. Tolerating Lopressor well, HR 60 bpm. Denies any recent symptoms. Will continue to monitor as he denies any recent symptoms. Heart healthy diet and regular cardiovascular exercise encouraged. Has consistently declined AC in the past.   3. Mixed HLD, hx of statin intolerance, carotid artery stenosis Most labs from PCP's office showed stable LDL. Continue current medication regimen. Heart healthy diet and regular cardiovascular exercise encouraged. Continue to follow with PCP. Carotid duplex revealed 1-39% stenosis along bilateral ICA, denies any symptoms. LDL goal < 55, will discuss further options at next OV, previously referred to [facility].    Disposition: Follow up in 6 weeks with Nona Dell, MD or APP.  Signed, Sharlene Dory, NP

## 2023-02-16 ENCOUNTER — Encounter: Payer: Self-pay | Admitting: Gastroenterology

## 2023-02-16 ENCOUNTER — Ambulatory Visit: Payer: 59 | Admitting: Gastroenterology

## 2023-02-16 NOTE — Progress Notes (Deleted)
GI Office Note    Referring Provider: Benita Stabile, MD Primary Care Physician:  Benita Stabile, MD Primary Gastroenterologist: Hennie Duos. Marletta Lor, DO  Date:  02/16/2023  ID:  William Elliott, DOB 06/17/1940, MRN [id number]   Chief Complaint   No chief complaint on file.   History of Present Illness  William Elliott is a 82 y.o. male with a history of CAD s/p CABG and drug-eluting stent in 2017, permanent A-fib, HLD, bradycardia, history of lower GI bleed, HTN, HLD, and stroke presenting today for hospital follow-up.  Seen during hospitalization 01/17/2023.  Patient presented with chest pain radiating to his shoulder as well as dyspnea and tightness in his jaw.  He did not have any improvement with nitroglycerin.  His hemoglobin was found to be 6.1 and had rectal exam with gross hematochezia he denied any abdominal pain or blood clots but did note some indigestion.  Hospital consult note outlines previous hospitalization in April 2023 for rectal bleeding and acute blood loss anemia with CTA at the time with active bleed in the splenic flexure and extensive diverticulosis and he was recommended to be admitted to [facility] for IR intervention however he left AMA.  Patient had reported his last colonoscopy was greater than 10 years prior and no family history of colon cancer.  He was transfused during his hospitalization with plans for endoscopic intervention once he improved from a respiratory standpoint.  Cardiology was following him and he was deemed to be an NSTEMI and had further workup with cardiology and was refusing transfer to [facility] initially.  He was considered to be too high risk to perform any procedures any.  It was recommended to transfer to [facility] in [place] and he was eventually transferred.  His bleeding had slowed up and [facility]: Seen him in consultation and recommended CTA stat if he began to have rebleeding and suggested IR consult if positive.  After in-depth  conversation with patient and family it was decided that he would reserve colonoscopy for emergent/urgent therapeutic intent.  His hemoglobins were monitored serially and he was resumed on baby aspirin and PPI changed to p.o.  Bleeding felt to be secondary to stuttering diverticular bleed and he had no further rectal bleeding after transfer and had normal stools prior to discharge.  Hemoglobin was stable at 9.1 on D/c.      Latest Ref Rng & Units 01/22/2023   10:59 AM 01/22/2023    4:05 AM 01/21/2023   11:33 PM  CBC  WBC 4.0 - 10.5 K/uL  9.1    Hemoglobin 13.0 - 17.0 g/dL 9.1  8.8  8.1   Hematocrit 39.0 - 52.0 % 29.1  28.2  25.9   Platelets 150 - 400 K/uL  231      Outpatient labs reviewed from 12/4 with hemoglobin stable at 9.1.  Today:    Wt Readings from Last 3 Encounters:  02/12/23 185 lb (83.9 kg)  01/18/23 182 lb 5.1 oz (82.7 kg)  01/12/23 194 lb 4.8 oz (88.1 kg)    Current Outpatient Medications  Medication Sig Dispense Refill   albuterol (VENTOLIN HFA) 108 (90 Base) MCG/ACT inhaler Inhale 2 puffs into the lungs every 6 (six) hours as needed for wheezing or shortness of breath.     ALPRAZolam (XANAX) 1 MG tablet Take 1 tablet (1 mg total) by mouth at bedtime as needed for anxiety. (Patient taking differently: Take 1 mg by mouth 2 (two) times daily as needed  for anxiety.) 10 tablet 0   ascorbic acid (VITAMIN C) 500 MG tablet Take 500 mg by mouth daily.     aspirin EC 81 MG tablet Take 1 tablet (81 mg total) by mouth daily. 90 tablet 3   b complex vitamins tablet Take 1 tablet by mouth daily.     calcium carbonate (TUMS - DOSED IN MG ELEMENTAL CALCIUM) 500 MG chewable tablet Chew 1 tablet by mouth daily as needed for indigestion or heartburn.     Cholecalciferol (VITAMIN D3) 25 MCG (1000 UT) CAPS Take 1 capsule by mouth in the morning and at bedtime.     diphenhydrAMINE (BENADRYL) 25 mg capsule Take 50 mg by mouth every 6 (six) hours as needed for sleep.     FEROSUL 325 (65  Fe) MG tablet Take 325 mg by mouth daily.     furosemide (LASIX) 40 MG tablet Take 1 tablet (40 mg total) by mouth daily. 30 tablet 0   isosorbide mononitrate (IMDUR) 30 MG 24 hr tablet TAKE 1 TABLET BY MOUTH TWICE DAILY 180 tablet 3   MAGNESIUM OXIDE PO Take 1 tablet by mouth daily.     metoprolol tartrate (LOPRESSOR) 25 MG tablet Take 0.5 tablets (12.5 mg total) by mouth 2 (two) times daily. 30 tablet 0   multivitamin (THERAGRAN) per tablet Take 1 tablet by mouth daily.     NEXLIZET 180-10 MG TABS Take 1 tablet by mouth daily.     nitroGLYCERIN (NITROSTAT) 0.4 MG SL tablet DISSOLVE 1 TABLET UNDER THE TONGUE EVERY 5 MINUTES AS NEEDED FOR CHEST PAIN. DO NOT EXCEED 3 DOSES IN 15 MIN. CALL 911 IF NO RELIEF 25 tablet 3   Omega 3 1000 MG CAPS Take 1 capsule by mouth daily.     pantoprazole (PROTONIX) 40 MG tablet TAKE 1 TABLET BY MOUTH EVERY DAY 90 tablet 1   potassium chloride SA (KLOR-CON M) 20 MEQ tablet TAKE 1 TABLET BY MOUTH TWICE DAILY 180 tablet 0   Tiotropium Bromide-Olodaterol (STIOLTO RESPIMAT) 2.5-2.5 MCG/ACT AERS Inhale 1 puff into the lungs 2 (two) times a day.     Zinc 50 MG CAPS Take 1 capsule by mouth daily.     No current facility-administered medications for this visit.    Past Medical History:  Diagnosis Date   Acute lower GI bleeding 2008   Anxiety    Asthma    Carotid artery disease (HCC)    Asymptomatic left carotid bruit   COPD (chronic obstructive pulmonary disease) (HCC)    Coronary artery disease    a. CABG - 1999 with LIMA-LAD, SVG-DIAG, SVG-OM, SVG-RPDA  b. Cath in setting of NSTEMI 03/18/2015: patent LIMA-LAD, occluded SVG-RCA, occluded SVG-D1, 99% stenosis of SVG-OM treated w/ DES   Depression    Dyslipidemia    Erectile dysfunction    Essential hypertension    History of blood transfusion 2008   History of stroke    Hyperlipidemia    Myocardial infarction [facility])     Past Surgical History:  Procedure Laterality Date   CARDIAC CATHETERIZATION     CARDIAC  CATHETERIZATION N/A 03/18/2015   Procedure: Left Heart Cath and Cors/Grafts Angiography;  Surgeon: Marykay Lex, MD;  Location: [facility] INVASIVE CV LAB;  Service: Cardiovascular;  Laterality: N/A;   CARDIAC CATHETERIZATION N/A 03/18/2015   Procedure: Coronary Stent Intervention;  Surgeon: Marykay Lex, MD;  Location: [facility] INVASIVE CV LAB;  Service: Cardiovascular;  Laterality: N/A;   CATARACT EXTRACTION W/ INTRAOCULAR LENS  IMPLANT, BILATERAL Bilateral  CORONARY ANGIOPLASTY     CORONARY ARTERY BYPASS GRAFT  1999   CORONARY STENT INTERVENTION N/A 04/22/2020   Procedure: CORONARY STENT INTERVENTION;  Surgeon: Lyn Records, MD;  Location: [facility] INVASIVE CV LAB;  Service: Cardiovascular;  Laterality: N/A;   FRACTURE SURGERY     HERNIA REPAIR     LEFT HEART CATH AND CORS/GRAFTS ANGIOGRAPHY N/A 04/22/2020   Procedure: LEFT HEART CATH AND CORS/GRAFTS ANGIOGRAPHY;  Surgeon: Lyn Records, MD;  Location: [facility] INVASIVE CV LAB;  Service: Cardiovascular;  Laterality: N/A;   TIBIA FRACTURE SURGERY Left [facility]; took bone out of my right hip   TONSILLECTOMY     UMBILICAL HERNIA REPAIR  ~ 2000    Family History  Problem Relation Age of Onset   Heart disease Father    Heart attack Father    Hypertension Mother    Stroke Mother     Allergies as of 02/16/2023 - Review Complete 02/12/2023  Allergen Reaction Noted   Anoro ellipta [umeclidinium-vilanterol] Other (See Comments) 03/17/2015   Lipitor [atorvastatin calcium] Other (See Comments) 06/27/2010   Niaspan [niacin] Other (See Comments) 07/03/2010   Zocor [simvastatin] Other (See Comments) 07/03/2010   Fenofibrate Other (See Comments) 07/03/2010    Social History   Socioeconomic History   Marital status: Married    Spouse name: Not on file   Number of children: Not on file   Years of education: Not on file   Highest education level: Not on file  Occupational History   Occupation: Welder x 10 yrs   Tobacco Use   Smoking  status: Former    Types: Cigars    Quit date: 03/02/1997    Years since quitting: 25.9    Passive exposure: Never   Smokeless tobacco: Never   Tobacco comments:    smoked cigars x 10 yrs, never smoked cigarettes  Vaping Use   Vaping status: Never Used  Substance and Sexual Activity   Alcohol use: No    Alcohol/week: 0.0 standard drinks of alcohol    Comment: "drank some when I was young; quit in the 1990s"   Drug use: No   Sexual activity: Not Currently  Other Topics Concern   Not on file  Social History Narrative   Not on file   Social Drivers of Health   Financial Resource Strain: Not on file  Food Insecurity: No Food Insecurity (01/17/2023)   Hunger Vital Sign    Worried About Running Out of Food in the Last Year: Never true    Ran Out of Food in the Last Year: Never true  Transportation Needs: No Transportation Needs (01/17/2023)   PRAPARE - Administrator, Civil Service (Medical): No    Lack of Transportation (Non-Medical): No  Physical Activity: Not on file  Stress: Not on file  Social Connections: Not on file     Review of Systems   Gen: Denies fever, chills, anorexia. Denies fatigue, weakness, weight loss.  CV: Denies chest pain, palpitations, syncope, peripheral edema, and claudication. Resp: Denies dyspnea at rest, cough, wheezing, coughing up blood, and pleurisy. GI: See HPI Derm: Denies rash, itching, dry skin Psych: Denies depression, anxiety, memory loss, confusion. No homicidal or suicidal ideation.  Heme: Denies bruising, bleeding, and enlarged lymph nodes.  Physical Exam   There were no vitals taken for this visit.  General:   Alert and oriented. No distress noted. Pleasant and cooperative.  Head:  Normocephalic and atraumatic. Eyes:  Conjuctiva  clear without scleral icterus. Mouth:  Oral mucosa pink and moist. Good dentition. No lesions. Lungs:  Clear to auscultation bilaterally. No wheezes, rales, or rhonchi. No distress.  Heart:   S1, S2 present without murmurs appreciated.  Abdomen:  +BS, soft, non-tender and non-distended. No rebound or guarding. No HSM or masses noted. Rectal: *** Msk:  Symmetrical without gross deformities. Normal posture. Extremities:  Without edema. Neurologic:  Alert and  oriented x4 Psych:  Alert and cooperative. Normal mood and affect.  Assessment  William Elliott is a 82 y.o. male with a history of CAD s/p CABG and drug-eluting stent in 2017, permanent A-fib, HLD, bradycardia, history of lower GI bleed, HTN, HLD, and stroke  presenting today for hospital follow-up.  Anemia, lower GI bleed:   PLAN   *** Monitor for recurrent bleeding Continue pantoprazole 40 mg daily Continue daily iron *** Follow up in 6 months    Brooke Bonito, MSN, FNP-BC, AGACNP-BC [facility]

## 2023-02-19 ENCOUNTER — Other Ambulatory Visit: Payer: Self-pay | Admitting: Cardiology

## 2023-03-08 ENCOUNTER — Other Ambulatory Visit: Payer: Self-pay | Admitting: Cardiology

## 2023-03-08 DIAGNOSIS — I119 Hypertensive heart disease without heart failure: Secondary | ICD-10-CM

## 2023-03-24 ENCOUNTER — Telehealth: Payer: Self-pay | Admitting: Nurse Practitioner

## 2023-03-24 NOTE — Telephone Encounter (Signed)
Patient is asking if his appt tomorrow could be changed to a virtual visit due to being sick

## 2023-03-25 ENCOUNTER — Encounter: Payer: Self-pay | Admitting: Nurse Practitioner

## 2023-03-25 ENCOUNTER — Ambulatory Visit: Payer: 59 | Attending: Nurse Practitioner | Admitting: Nurse Practitioner

## 2023-03-25 VITALS — BP 129/76 | HR 54 | Ht 70.0 in | Wt 188.0 lb

## 2023-03-25 DIAGNOSIS — Z789 Other specified health status: Secondary | ICD-10-CM

## 2023-03-25 DIAGNOSIS — I2510 Atherosclerotic heart disease of native coronary artery without angina pectoris: Secondary | ICD-10-CM | POA: Diagnosis not present

## 2023-03-25 DIAGNOSIS — I6523 Occlusion and stenosis of bilateral carotid arteries: Secondary | ICD-10-CM | POA: Diagnosis not present

## 2023-03-25 DIAGNOSIS — R0602 Shortness of breath: Secondary | ICD-10-CM

## 2023-03-25 DIAGNOSIS — I480 Paroxysmal atrial fibrillation: Secondary | ICD-10-CM

## 2023-03-25 DIAGNOSIS — I5022 Chronic systolic (congestive) heart failure: Secondary | ICD-10-CM

## 2023-03-25 DIAGNOSIS — E782 Mixed hyperlipidemia: Secondary | ICD-10-CM

## 2023-03-25 DIAGNOSIS — I25110 Atherosclerotic heart disease of native coronary artery with unstable angina pectoris: Secondary | ICD-10-CM

## 2023-03-25 DIAGNOSIS — I4821 Permanent atrial fibrillation: Secondary | ICD-10-CM | POA: Diagnosis not present

## 2023-03-25 DIAGNOSIS — R001 Bradycardia, unspecified: Secondary | ICD-10-CM | POA: Diagnosis not present

## 2023-03-25 DIAGNOSIS — I25119 Atherosclerotic heart disease of native coronary artery with unspecified angina pectoris: Secondary | ICD-10-CM

## 2023-03-25 MED ORDER — FUROSEMIDE 40 MG PO TABS: 40.0000 mg | ORAL_TABLET | Freq: Every day | ORAL | 1 refills | Status: DC

## 2023-03-25 NOTE — Progress Notes (Signed)
Virtual Visit via Telephone Note   Because of Deion Swift Streng's co-morbid illnesses, he is at least at moderate risk for complications without adequate follow up.  This format is felt to be most appropriate for this patient at this time.  The patient did not have access to video technology/had technical difficulties with video requiring transitioning to audio format only (telephone).  All issues noted in this document were discussed and addressed.  No physical exam could be performed with this format.  Please refer to the patient's chart for his consent to telehealth for [facility].    Date:  03/25/2023 ID:  GAVON MAJANO, DOB 02-05-1941, MRN [id number] The patient was identified using 2 identifiers. Patient Location: Home Provider Location: Office/Clinic PCP:  Benita Stabile, MD [facility] Providers Cardiologist:  Nona Dell, MD Cardiology APP:  Sharlene Dory, NP     Evaluation Performed:  Follow-Up Visit  Chief Complaint:  Here for follow-up  History of Present Illness:    ZAYLON BOSSIER is a 83 y.o. male with hx of CAD, s/p CABG and drug-eluting stent to SVG to OM in 2017, HFmrEF, permanent A-fib, bradycardia, mixed hyperlipidemia, history of lower GI bleed, dizziness, HTN, dyslipidemia, and past history of stroke, who presents today for follow-up.  History of CABG and drug-eluting stent to SVG to OM 2017, also received drug-eluting stent to SVG to OM in 2022 with occlusion of SVG to PDA and SVG to diagonal, LIMA to LAD was patent.    Last seen by Dr. Diona Browner on November 19, 2021.  He was doing well at the time, was working on weight loss.  Last seen on 02/12/2023. Was doing well. Admitted to some recent leg edema. Denied any chest pain, shortness of breath, palpitations, syncope, presyncope, dizziness, orthopnea, PND, significant weight changes, acute bleeding, or claudication.  03/25/2023 - He is here for telehealth visit as he is sick today and  feeling under the weather. Has been ongoing for the last 6-7 days. Taking a breathing treatment every day. Says now he has more edema in his feet/legs, wants to know if he can go up on his fluid pills. Does admit to shortness of breath when laying down, sleeps at an incline, denies any extra pillow use. Weight is around 188-189 lbs at home. Denies any chest pain, shortness of breath, palpitations, syncope, presyncope, dizziness, PND, acute bleeding, or claudication.  SH: Wife is Heinrich Fertig, another patient of mine, have been married for close to 60 years.   ROS:   Please see the history of present illness.    All other systems reviewed and are negative.   Prior CV studies:   The following studies were reviewed today:  EKG:  EKG is not ordered today.    Echo 01/2023:  1. Left ventricular ejection fraction, by estimation, is 45%. The left ventricle has mildly decreased function. Left ventricular endocardial border not optimally defined to evaluate regional wall motion. There is severe asymmetric left ventricular  hypertrophy of the lateral segment. Left ventricular diastolic function could not be evaluated. There is the interventricular septum is flattened in systole and diastole, consistent with right ventricular pressure and volume overload.   2. Right ventricular systolic function is mildly reduced. The right  ventricular size is mildly enlarged. There is moderately elevated  pulmonary artery systolic pressure. The estimated right ventricular systolic pressure is 48.4 mmHg.   3. Left atrial size was severely dilated.   4. Right atrial size was severely  dilated.   5. The mitral valve is abnormal. Mild mitral valve regurgitation. No evidence of mitral stenosis. Moderate mitral annular calcification.   6. The aortic valve is tricuspid. Aortic valve regurgitation is not  visualized. Mild aortic valve stenosis. Aortic valve area, by VTI measures 1.55 cm. Aortic valve mean gradient measures  8.0 mmHg. Aortic valve Vmax measures 2.11 m/s.   7. The inferior vena cava is dilated in size with <50% respiratory  variability, suggesting right atrial pressure of 15 mmHg.   Comparison(s): Prior images reviewed side by side. Changes from prior study are noted. LVEF worsened from normal to 45% now.  Carotid duplex 11/2022:  Summary:  Right Carotid: Velocities in the right ICA are consistent with a 1-39% stenosis. Non-hemodynamically significant plaque <50% noted in the CCA. The ECA appears <50% stenosed.   Left Carotid: Velocities in the left ICA are consistent with a 1-39% stenosis. Non-hemodynamically significant plaque <50% noted in the CCA. The ECA appears <50% stenosed.   Vertebrals:  Bilateral vertebral arteries demonstrate antegrade flow. Subclavians: Normal flow hemodynamics were seen in bilateral subclavian arteries.  Cardiac monitor 10/2022:  ZIO monitor reviewed.  6 days, 3 hours analyzed.   Predominant rhythm is atrial fibrillation with heart rate ranging from 30 bpm up to 75 bpm and average heart rate 41 bpm.  In the right clinical context, could be consistent with chronotropic incompetence and symptomatic bradycardia. There were frequent PVCs representing 21.6% total beats.  Otherwise rare ventricular couplets and triplets were noted as well as limited episodes of ventricular bigeminy and trigeminy. No pauses.  LHC 04/2020:  A stent was successfully placed.   99% mid body stenosis in SVG to obtuse marginal reduced to 10% with TIMI grade III flow using a 4.0 x 22 Onyx deployed at 12 atm.  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 (The previously placed ostial to proximal saphenous vein to OM stent overhangs into the ascending aorta preventing engagement with extra-support guide catheters.  An internal mammary guide catheter was used to coaxially aim and advanced the guidewire into the graft). Chronic occlusion of SVG to PDA. Chronic occlusion  of SVG to diagonal. Patent LIMA to the mid to distal LAD.  Supplies collaterals to the PDA. Total occlusion of the mid LAD.  95% ostial LAD stenosis with only septal perforator and a small diagonal at risk. Total occlusion of the mid circumflex Total occlusion of the mid RCA.  PDA is supplied by LAD collaterals when the LIMA graft to the LAD is visualized. Echocardiogram documents preserved LV systolic function, EF greater than 50%.  LVEDP is normal, 7 mmHg.   RECOMMENDATIONS:   Aspirin and Plavix for 12 months. If decision to use anticoagulation for management of atrial fibrillation is made, would recommend discontinuing aspirin after 1 month and continuing Plavix to complete 12 months  Echo 04/2020:  1. Left ventricular ejection fraction, by estimation, is 55 to 60%. The  left ventricle has normal function. The left ventricle demonstrates  regional wall motion abnormalities (see scoring diagram/findings for  description). There is severe left  ventricular hypertrophy. Left ventricular diastolic function could not be  evaluated. There is moderate hypokinesis of the left ventricular,  basal-mid inferior wall.   2. Right ventricular systolic function is low normal. The right  ventricular size is normal. Tricuspid regurgitation signal is inadequate  for assessing PA pressure.   3. Left atrial size was severely dilated.   4. Right atrial size  was mildly dilated.   5. Moderate pleural effusion in the left lateral region.   6. The mitral valve is abnormal. Trivial mitral valve regurgitation.   7. The aortic valve is tricuspid. Aortic valve regurgitation is not  visualized. Mild aortic valve sclerosis is present, with no evidence of  aortic valve stenosis. Aortic valve mean gradient measures 6.0 mmHg.   8. The inferior vena cava is dilated in size with <50% respiratory  variability, suggesting right atrial pressure of 15 mmHg.   Comparison(s): Changes from prior study are noted. 12/09/15:  LVEF 65-70%,  normal wall motion.  Labs/Other Tests and Data Reviewed:     Wt Readings from Last 3 Encounters:  03/25/23 188 lb (85.3 kg)  02/12/23 185 lb (83.9 kg)  01/18/23 182 lb 5.1 oz (82.7 kg)     Risk Assessment/Calculations:    CHA2DS2-VASc Score = 6  This indicates a 9.7% annual risk of stroke. The patient's score is based upon: CHF History: 0 HTN History: 1 Diabetes History: 0 Stroke History: 2 Vascular Disease History: 1 Age Score: 2 Gender Score: 0     Objective:    Vital Signs:  BP 129/76   Pulse (!) 54   Ht 5\' 10"  (1.778 m)   Wt 188 lb (85.3 kg)   SpO2 97%   BMI 26.98 kg/m    Due to nature of today's visit, a physical exam was unable to be performed.  ASSESSMENT & PLAN:    HFmrEF, shortness of breath Stage C, NYHA class I symptoms.  Weight is up a few pounds from last month.  Most recent echocardiogram revealed EF 45%, severe asymmetric left ventricle hypertrophy with findings consistent with right ventricular pressure and volume overload.  Moderately elevated PASP with estimated right ventricular systolic pressure at 48.4 mmHg.  Patient does note some leg edema as well as some shortness of breath with laying down.   He has been out of Lasix for the last few days.  Will restart Lasix 40 mg daily and obtain CBC, CMET, magnesium in 1 week.  He will update [place] in 1 week about how his leg edema and symptoms are doing. Continue rest of GDMT at this time. Low sodium diet, fluid restriction <2L, and daily weights encouraged. Educated to contact our office for weight gain of 2 lbs overnight or 5 lbs in one week.   CAD, s/p CABG  Hx graft dx and DES to SVG-OM in 2017, s/p DES to SVG-OM in 2022, occlusion of PDA and SVG diagonal, patent LIMA-LAD. Stable with no anginal symptoms. No indication for ischemic evaluation. Continue current medication regimen.  Heart healthy diet and regular cardiovascular exercise encouraged.   2. Permanent A-fib, bradycardia Denies any  tachycardia or palpitations. Previous monitor revealed predominant A-fib with average HR 41 bpm. Was noted that in right clinical context, could be consistent with chronotropic incompetence and symptomatic bradycardia, frequent PVC's also noted, no pauses. Tolerating Lopressor well. Denies any recent symptoms. Will continue to monitor as he denies any recent symptoms. Heart healthy diet and regular cardiovascular exercise encouraged. Has consistently declined AC in the past.   3. Mixed HLD, hx of statin intolerance, carotid artery stenosis Last LDL in 80's. Goal LDL is < 55. Continue current medication regimen. Heart healthy diet and regular cardiovascular exercise encouraged. Continue to follow with PCP. Carotid duplex revealed 1-39% stenosis along bilateral ICA, denies any symptoms. Will obtain lipid panel.    Time:   Today, I have spent 12 minutes with the  patient with telehealth technology discussing the above problems.     Medication Adjustments/Labs and Tests Ordered: Current medicines are reviewed at length with the patient today.  Concerns regarding medicines are outlined above.   Tests Ordered: No orders of the defined types were placed in this encounter.   Medication Changes: No orders of the defined types were placed in this encounter.   Follow Up:  In Person in 6 week(s)  Signed, Sharlene Dory, NP

## 2023-03-25 NOTE — Telephone Encounter (Signed)
  Patient Consent for Virtual Visit        MAXINE NISH has provided verbal consent on 03/25/2023 for a virtual visit (video or telephone).   CONSENT FOR VIRTUAL VISIT FOR:  Ruben Reason  By participating in this virtual visit I agree to the following:  I hereby voluntarily request, consent and authorize [facility] and its employed or contracted physicians, physician assistants, nurse practitioners or other licensed health care professionals (the Practitioner), to provide me with telemedicine health care services (the "Services") as deemed necessary by the treating Practitioner. I acknowledge and consent to receive the Services by the Practitioner via telemedicine. I understand that the telemedicine visit will involve communicating with the Practitioner through live audiovisual communication technology and the disclosure of certain medical information by electronic transmission. I acknowledge that I have been given the opportunity to request an in-person assessment or other available alternative prior to the telemedicine visit and am voluntarily participating in the telemedicine visit.  I understand that I have the right to withhold or withdraw my consent to the use of telemedicine in the course of my care at any time, without affecting my right to future care or treatment, and that the Practitioner or I may terminate the telemedicine visit at any time. I understand that I have the right to inspect all information obtained and/or recorded in the course of the telemedicine visit and may receive copies of available information for a reasonable fee.  I understand that some of the potential risks of receiving the Services via telemedicine include:  Delay or interruption in medical evaluation due to technological equipment failure or disruption; Information transmitted may not be sufficient (e.g. poor resolution of images) to allow for appropriate medical decision making by the  Practitioner; and/or  In rare instances, security protocols could fail, causing a breach of personal health information.  Furthermore, I acknowledge that it is my responsibility to provide information about my medical history, conditions and care that is complete and accurate to the best of my ability. I acknowledge that Practitioner's advice, recommendations, and/or decision may be based on factors not within their control, such as incomplete or inaccurate data provided by me or distortions of diagnostic images or specimens that may result from electronic transmissions. I understand that the practice of medicine is not an exact science and that Practitioner makes no warranties or guarantees regarding treatment outcomes. I acknowledge that a copy of this consent can be made available to me via my patient portal [facility] MyChart), or I can request a printed copy by calling the office of [facility].    I understand that my insurance will be billed for this visit.   I have read or had this consent read to me. I understand the contents of this consent, which adequately explains the benefits and risks of the Services being provided via telemedicine.  I have been provided ample opportunity to ask questions regarding this consent and the Services and have had my questions answered to my satisfaction. I give my informed consent for the services to be provided through the use of telemedicine in my medical care

## 2023-03-25 NOTE — Telephone Encounter (Signed)
Patient informed and verbalized understanding of plan. 

## 2023-03-25 NOTE — Patient Instructions (Signed)
Medication Instructions:  Your physician recommends that you continue on your current medications as directed. Please refer to the Current Medication list given to you today.  Labwork: In 1 week (Lab orders mailed to patient home)   Testing/Procedures: None   Follow-Up: Your physician recommends that you schedule a follow-up appointment in: 6 weeks in office   Any Other Special Instructions Will Be Listed Below (If Applicable).  If you need a refill on your cardiac medications before your next appointment, please call your pharmacy.

## 2023-03-29 DIAGNOSIS — J069 Acute upper respiratory infection, unspecified: Secondary | ICD-10-CM | POA: Diagnosis not present

## 2023-03-29 DIAGNOSIS — R062 Wheezing: Secondary | ICD-10-CM | POA: Diagnosis not present

## 2023-04-09 DIAGNOSIS — I25110 Atherosclerotic heart disease of native coronary artery with unstable angina pectoris: Secondary | ICD-10-CM | POA: Diagnosis not present

## 2023-04-09 DIAGNOSIS — I2510 Atherosclerotic heart disease of native coronary artery without angina pectoris: Secondary | ICD-10-CM | POA: Diagnosis not present

## 2023-04-09 DIAGNOSIS — I25119 Atherosclerotic heart disease of native coronary artery with unspecified angina pectoris: Secondary | ICD-10-CM | POA: Diagnosis not present

## 2023-04-10 LAB — BASIC METABOLIC PANEL
BUN/Creatinine Ratio: 20 (ref 10–24)
BUN: 28 mg/dL — ABNORMAL HIGH (ref 8–27)
CO2: 24 mmol/L (ref 20–29)
Calcium: 9.3 mg/dL (ref 8.6–10.2)
Chloride: 101 mmol/L (ref 96–106)
Creatinine, Ser: 1.37 mg/dL — ABNORMAL HIGH (ref 0.76–1.27)
Glucose: 88 mg/dL (ref 70–99)
Potassium: 4.3 mmol/L (ref 3.5–5.2)
Sodium: 140 mmol/L (ref 134–144)
eGFR: 52 mL/min/{1.73_m2} — ABNORMAL LOW (ref >59)

## 2023-04-10 LAB — CBC
Hematocrit: 34.3 % — ABNORMAL LOW (ref 37.5–51.0)
Hemoglobin: 10.7 g/dL — ABNORMAL LOW (ref 13.0–17.7)
MCH: 26.3 pg — ABNORMAL LOW (ref 26.6–33.0)
MCHC: 31.2 g/dL — ABNORMAL LOW (ref 31.5–35.7)
MCV: 84 fL (ref 79–97)
Platelets: 211 10*3/uL (ref 150–450)
RBC: 4.07 x10E6/uL — ABNORMAL LOW (ref 4.14–5.80)
RDW: 16.2 % — ABNORMAL HIGH (ref 11.6–15.4)
WBC: 7.7 10*3/uL (ref 3.4–10.8)

## 2023-04-19 ENCOUNTER — Other Ambulatory Visit: Payer: Self-pay | Admitting: Cardiology

## 2023-04-23 ENCOUNTER — Ambulatory Visit: Payer: 59 | Admitting: Cardiology

## 2023-05-03 DIAGNOSIS — I10 Essential (primary) hypertension: Secondary | ICD-10-CM | POA: Diagnosis not present

## 2023-05-03 DIAGNOSIS — N289 Disorder of kidney and ureter, unspecified: Secondary | ICD-10-CM | POA: Diagnosis not present

## 2023-05-03 DIAGNOSIS — R7301 Impaired fasting glucose: Secondary | ICD-10-CM | POA: Diagnosis not present

## 2023-05-03 DIAGNOSIS — D509 Iron deficiency anemia, unspecified: Secondary | ICD-10-CM | POA: Diagnosis not present

## 2023-05-04 ENCOUNTER — Ambulatory Visit: Payer: 59 | Admitting: Nurse Practitioner

## 2023-05-14 DIAGNOSIS — N179 Acute kidney failure, unspecified: Secondary | ICD-10-CM | POA: Diagnosis not present

## 2023-05-14 DIAGNOSIS — I509 Heart failure, unspecified: Secondary | ICD-10-CM | POA: Diagnosis not present

## 2023-05-14 DIAGNOSIS — R001 Bradycardia, unspecified: Secondary | ICD-10-CM | POA: Diagnosis not present

## 2023-05-14 DIAGNOSIS — I4891 Unspecified atrial fibrillation: Secondary | ICD-10-CM | POA: Diagnosis not present

## 2023-05-14 DIAGNOSIS — J449 Chronic obstructive pulmonary disease, unspecified: Secondary | ICD-10-CM | POA: Diagnosis not present

## 2023-05-14 DIAGNOSIS — R600 Localized edema: Secondary | ICD-10-CM | POA: Diagnosis not present

## 2023-05-14 DIAGNOSIS — I252 Old myocardial infarction: Secondary | ICD-10-CM | POA: Diagnosis not present

## 2023-05-14 DIAGNOSIS — I110 Hypertensive heart disease with heart failure: Secondary | ICD-10-CM | POA: Diagnosis not present

## 2023-05-14 DIAGNOSIS — E782 Mixed hyperlipidemia: Secondary | ICD-10-CM | POA: Diagnosis not present

## 2023-05-14 DIAGNOSIS — R7301 Impaired fasting glucose: Secondary | ICD-10-CM | POA: Diagnosis not present

## 2023-05-18 ENCOUNTER — Encounter: Payer: Self-pay | Admitting: Family Medicine

## 2023-05-18 ENCOUNTER — Ambulatory Visit: Payer: 59 | Admitting: Nurse Practitioner

## 2023-06-09 DIAGNOSIS — H905 Unspecified sensorineural hearing loss: Secondary | ICD-10-CM | POA: Diagnosis not present

## 2023-06-17 ENCOUNTER — Ambulatory Visit: Payer: 59 | Attending: Nurse Practitioner | Admitting: Nurse Practitioner

## 2023-06-17 ENCOUNTER — Encounter: Payer: Self-pay | Admitting: Nurse Practitioner

## 2023-06-17 VITALS — BP 122/70 | HR 68 | Ht 70.5 in | Wt 181.6 lb

## 2023-06-17 DIAGNOSIS — Z789 Other specified health status: Secondary | ICD-10-CM | POA: Diagnosis not present

## 2023-06-17 DIAGNOSIS — I4821 Permanent atrial fibrillation: Secondary | ICD-10-CM | POA: Diagnosis not present

## 2023-06-17 DIAGNOSIS — R001 Bradycardia, unspecified: Secondary | ICD-10-CM | POA: Diagnosis not present

## 2023-06-17 DIAGNOSIS — I6523 Occlusion and stenosis of bilateral carotid arteries: Secondary | ICD-10-CM

## 2023-06-17 DIAGNOSIS — I5022 Chronic systolic (congestive) heart failure: Secondary | ICD-10-CM | POA: Diagnosis not present

## 2023-06-17 DIAGNOSIS — E782 Mixed hyperlipidemia: Secondary | ICD-10-CM

## 2023-06-17 DIAGNOSIS — I2510 Atherosclerotic heart disease of native coronary artery without angina pectoris: Secondary | ICD-10-CM

## 2023-06-17 NOTE — Patient Instructions (Signed)

## 2023-06-17 NOTE — Progress Notes (Signed)
 Cardiology Office Note:  .   Date:  06/17/2023 ID:  William Elliott, DOB [birth year], MRN [id number] PCP: Omie Bickers, MD  [facility] Providers Cardiologist:  Teddie Favre, MD Cardiology APP:  Lasalle Pointer, NP    History of Present Illness: .   William Elliott is a 83 y.o. male with a PMH of CAD, s/p CABG and drug-eluting stent to SVG to OM in 2017, HFmrEF, permanent A-fib, bradycardia, mixed hyperlipidemia, history of lower GI bleed, dizziness, HTN, dyslipidemia, and past history of stroke, who presents today for follow-up.  History of CABG and drug-eluting stent to SVG to OM 2017, also received drug-eluting stent to SVG to OM in 2022 with occlusion of SVG to PDA and SVG to diagonal, LIMA to LAD was patent.    Last seen by Dr. Londa Rival on November 19, 2021.  He was doing well at the time, was working on weight loss.  Last seen on 02/12/2023. Was doing well. Admitted to some recent leg edema. Denied any chest pain, shortness of breath, palpitations, syncope, presyncope, dizziness, orthopnea, PND, significant weight changes, acute bleeding, or claudication.  03/25/2023 - He is here for telehealth visit as he is sick today and feeling under the weather. Has been ongoing for the last 6-7 days. Taking a breathing treatment every day. Says now he has more edema in his feet/legs, wants to know if he can go up on his fluid pills. Does admit to shortness of breath when laying down, sleeps at an incline, denies any extra pillow use. Weight is around 188-189 lbs at home. Denies any chest pain, shortness of breath, palpitations, syncope, presyncope, dizziness, PND, acute bleeding, or claudication.  06/17/2023 - Today he presents for follow-up. Says he is feeling great. Admits to good diuresis since now back on Lasix . Denies any chest pain, shortness of breath, palpitations, syncope, presyncope, dizziness, orthopnea, PND, swelling or significant weight changes, acute bleeding, or  claudication.   ROS: Negative. See HPI.   SH: Wife is William Elliott, another patient of mine, have been married for close to 60 years.   Studies Reviewed: Aaron Aas     EKG: EKG is not ordered today.   Echo 01/2023:  1. Left ventricular ejection fraction, by estimation, is 45%. The left ventricle has mildly decreased function. Left ventricular endocardial border not optimally defined to evaluate regional wall motion. There is severe asymmetric left ventricular  hypertrophy of the lateral segment. Left ventricular diastolic function could not be evaluated. There is the interventricular septum is flattened in systole and diastole, consistent with right ventricular pressure and volume overload.   2. Right ventricular systolic function is mildly reduced. The right  ventricular size is mildly enlarged. There is moderately elevated  pulmonary artery systolic pressure. The estimated right ventricular systolic pressure is 48.4 mmHg.   3. Left atrial size was severely dilated.   4. Right atrial size was severely dilated.   5. The mitral valve is abnormal. Mild mitral valve regurgitation. No evidence of mitral stenosis. Moderate mitral annular calcification.   6. The aortic valve is tricuspid. Aortic valve regurgitation is not  visualized. Mild aortic valve stenosis. Aortic valve area, by VTI measures 1.55 cm. Aortic valve mean gradient measures 8.0 mmHg. Aortic valve Vmax measures 2.11 m/s.   7. The inferior vena cava is dilated in size with <50% respiratory  variability, suggesting right atrial pressure of 15 mmHg.   Comparison(s): Prior images reviewed side by side. Changes from prior study are noted. LVEF  worsened from normal to 45% now.  Carotid duplex 11/2022:  Summary:  Right Carotid: Velocities in the right ICA are consistent with a 1-39% stenosis. Non-hemodynamically significant plaque <50% noted in the CCA. The ECA appears <50% stenosed.   Left Carotid: Velocities in the left ICA are  consistent with a 1-39% stenosis. Non-hemodynamically significant plaque <50% noted in the CCA. The ECA appears <50% stenosed.   Vertebrals:  Bilateral vertebral arteries demonstrate antegrade flow. Subclavians: Normal flow hemodynamics were seen in bilateral subclavian arteries.  Cardiac monitor 10/2022:  ZIO monitor reviewed.  6 days, 3 hours analyzed.   Predominant rhythm is atrial fibrillation with heart rate ranging from 30 bpm up to 75 bpm and average heart rate 41 bpm.  In the right clinical context, could be consistent with chronotropic incompetence and symptomatic bradycardia. There were frequent PVCs representing 21.6% total beats.  Otherwise rare ventricular couplets and triplets were noted as well as limited episodes of ventricular bigeminy and trigeminy. No pauses.  LHC 04/2020:  A stent was successfully placed.   99% mid body stenosis in SVG to obtuse marginal reduced to 10% with TIMI grade III flow using a 4.0 x 22 Onyx deployed at 12 atm.  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 (The previously placed ostial to proximal saphenous vein to OM stent overhangs into the ascending aorta preventing engagement with extra-support guide catheters.  An internal mammary guide catheter was used to coaxially aim and advanced the guidewire into the graft). Chronic occlusion of SVG to PDA. Chronic occlusion of SVG to diagonal. Patent LIMA to the mid to distal LAD.  Supplies collaterals to the PDA. Total occlusion of the mid LAD.  95% ostial LAD stenosis with only septal perforator and a small diagonal at risk. Total occlusion of the mid circumflex Total occlusion of the mid RCA.  PDA is supplied by LAD collaterals when the LIMA graft to the LAD is visualized. Echocardiogram documents preserved LV systolic function, EF greater than 50%.  LVEDP is normal, 7 mmHg.   RECOMMENDATIONS:   Aspirin  and Plavix  for 12 months. If decision to use anticoagulation for  management of atrial fibrillation is made, would recommend discontinuing aspirin  after 1 month and continuing Plavix  to complete 12 months  Echo 04/2020:  1. Left ventricular ejection fraction, by estimation, is 55 to 60%. The  left ventricle has normal function. The left ventricle demonstrates  regional wall motion abnormalities (see scoring diagram/findings for  description). There is severe left  ventricular hypertrophy. Left ventricular diastolic function could not be  evaluated. There is moderate hypokinesis of the left ventricular,  basal-mid inferior wall.   2. Right ventricular systolic function is low normal. The right  ventricular size is normal. Tricuspid regurgitation signal is inadequate  for assessing PA pressure.   3. Left atrial size was severely dilated.   4. Right atrial size was mildly dilated.   5. Moderate pleural effusion in the left lateral region.   6. The mitral valve is abnormal. Trivial mitral valve regurgitation.   7. The aortic valve is tricuspid. Aortic valve regurgitation is not  visualized. Mild aortic valve sclerosis is present, with no evidence of  aortic valve stenosis. Aortic valve mean gradient measures 6.0 mmHg.   8. The inferior vena cava is dilated in size with <50% respiratory  variability, suggesting right atrial pressure of 15 mmHg.   Comparison(s): Changes from prior study are noted. 12/09/15: LVEF 65-70%, normal wall motion.  Risk Assessment/Calculations:    CHA2DS2-VASc Score = 6  } This indicates a 9.7% annual risk of stroke. The patient's score is based upon: CHF History: 0 HTN History: 1 Diabetes History: 0 Stroke History: 2 Vascular Disease History: 1 Age Score: 2 Gender Score: 0  Physical Exam:   VS:  BP 122/70   Pulse 68   Ht 5' 10.5" (1.791 m)   Wt 181 lb 9.6 oz (82.4 kg)   SpO2 99%   BMI 25.69 kg/m    Wt Readings from Last 3 Encounters:  06/17/23 181 lb 9.6 oz (82.4 kg)  03/25/23 188 lb (85.3 kg)  02/12/23 185 lb  (83.9 kg)    GEN: Well nourished, well developed in no acute distress NECK: No JVD; No carotid bruits CARDIAC: S1/S2, irregularly irregular rhythm, no murmurs, rubs, gallops RESPIRATORY:  Clear to auscultation without rales, wheezing or rhonchi  ABDOMEN: Soft, non-tender, non-distended EXTREMITIES:  No edema; No deformity   ASSESSMENT AND PLAN: .    HFmrEF Stage C, NYHA class I symptoms. Weight is better since back on Lasix . Most recent echocardiogram revealed EF 45%, severe asymmetric left ventricle hypertrophy with findings consistent with right ventricular pressure and volume overload.  Moderately elevated PASP with estimated right ventricular systolic pressure at 48.4 mmHg. Continue rest of GDMT at this time. Low sodium diet, fluid restriction <2L, and daily weights encouraged. Educated to contact our office for weight gain of 2 lbs overnight or 5 lbs in one week.  2. CAD, s/p CABG  Hx graft dx and DES to SVG-OM in 2017, s/p DES to SVG-OM in 2022, occlusion of PDA and SVG diagonal, patent LIMA-LAD. Stable with no anginal symptoms. No indication for ischemic evaluation. Continue current medication regimen.  Heart healthy diet and regular cardiovascular exercise encouraged.   3. Permanent A-fib, bradycardia Denies any tachycardia or palpitations. Previous monitor revealed predominant A-fib with average HR 41 bpm. Was noted that in right clinical context, could be consistent with chronotropic incompetence and symptomatic bradycardia, frequent PVC's also noted, no pauses. Tolerating Lopressor  well. Denies any recent symptoms. Will continue to monitor as he denies any recent symptoms. Heart healthy diet and regular cardiovascular exercise encouraged. Has consistently declined AC in the past.   4. Mixed HLD, hx of statin intolerance, carotid artery stenosis Last LDL in 80's. Goal LDL is < 55. Continue current medication regimen. Has some hx of statin intolerance in the past (see allergy list),  discussed [id number] - pt declines. Heart healthy diet and regular cardiovascular exercise encouraged. Continue to follow with PCP. Carotid duplex revealed 1-39% stenosis along bilateral ICA, denies any symptoms.    Medication Adjustments/Labs and Tests Ordered: Current medicines are reviewed at length with the patient today.  Concerns regarding medicines are outlined above.   Tests Ordered: No orders of the defined types were placed in this encounter.   Medication Changes: No orders of the defined types were placed in this encounter.   Follow Up: With Dr. Londa Rival or APP in 6 months or sooner if anything changes.    Signed, Lasalle Pointer, NP

## 2023-08-20 ENCOUNTER — Other Ambulatory Visit: Payer: Self-pay | Admitting: Cardiology

## 2023-08-30 DIAGNOSIS — J40 Bronchitis, not specified as acute or chronic: Secondary | ICD-10-CM | POA: Diagnosis not present

## 2023-08-30 DIAGNOSIS — J069 Acute upper respiratory infection, unspecified: Secondary | ICD-10-CM | POA: Diagnosis not present

## 2023-08-30 NOTE — Progress Notes (Signed)
 Assessment and Plan:   Assessment & Plan Bronchitis  Orders: .  predniSONE (DELTASONE) 10 MG tablet; Take 6 tablets (60 mg total) by mouth daily for 1 day, THEN 5 tablets (50 mg total) daily for 1 day, THEN 4 tablets (40 mg total) daily for 1 day, THEN 3 tablets (30 mg total) daily for 1 day, THEN 2 tablets (20 mg total) daily for 1 day, THEN 1 tablet (10 mg total) daily for 1 day.  Upper respiratory tract infection, unspecified type  Orders: .  azithromycin (ZITHROMAX) 250 MG tablet; Take 2 tablets (500 mg total) by mouth daily for 1 day, THEN 1 tablet (250 mg total) daily for 4 days.     The following portions of the patient's history were reviewed and updated as appropriate: allergies, current medications, past family history, past medical history, past social history, past surgical history and problem list.  Subjective:   Patient ID: William Elliott is a 83 y.o. male Chief Complaint  Patient presents with  . Cough  . Fever    Patient complaining of wet cough, fever and chills for 2 months off and on.    Cough This is a new problem. The current episode started 1 to 4 weeks ago. The problem has been unchanged. The cough is Productive of sputum (became productive in the last week of yellow sputum). Associated symptoms include a fever, nasal congestion and wheezing. Pertinent negatives include no chest pain or shortness of breath. His past medical history is significant for asthma and COPD.  Fever  This is a new problem. The current episode started in the past 7 days. The problem occurs intermittently. Associated symptoms include coughing and wheezing. Pertinent negatives include no chest pain, diarrhea or urinary pain.    ROS: Negative unless otherwise noted in HPI.  Objective:   Vital Signs:  BP 148/63 (BP Site: L Arm, BP Position: Sitting)   Pulse 52   Temp 36.3 C (97.3 F) (Oral)   Resp 18   Ht 177.8 cm (5' 10)   Wt 77.7 kg (171 lb 4.8 oz)   SpO2 97%   BMI  24.58 kg/m   Body mass index is 24.58 kg/m. No LMP for male patient.  Physical Exam Vitals and nursing note reviewed.  Constitutional:      General: He is not in acute distress.    Appearance: Normal appearance. He is normal weight. He is not ill-appearing or toxic-appearing.  HENT:     Head: Normocephalic.     Right Ear: Tympanic membrane, ear canal and external ear normal.     Left Ear: Tympanic membrane, ear canal and external ear normal.     Nose: Nose normal.     Mouth/Throat:     Mouth: Mucous membranes are moist.     Pharynx: Oropharynx is clear.   Eyes:     Extraocular Movements: Extraocular movements intact.     Conjunctiva/sclera: Conjunctivae normal.     Pupils: Pupils are equal, round, and reactive to light.    Cardiovascular:     Rate and Rhythm: Normal rate and regular rhythm.     Heart sounds: Normal heart sounds.  Pulmonary:     Effort: Pulmonary effort is normal. No respiratory distress.     Breath sounds: Wheezing present.  Abdominal:     General: Abdomen is flat.     Palpations: Abdomen is soft.   Musculoskeletal:        General: Normal range of motion.  Cervical back: Normal range of motion.   Skin:    General: Skin is warm and dry.     Findings: No rash.   Neurological:     General: No focal deficit present.     Mental Status: He is alert and oriented to person, place, and time. Mental status is at baseline.   Psychiatric:        Mood and Affect: Mood normal.        Behavior: Behavior normal.        Thought Content: Thought content normal.        Judgment: Judgment normal.      PHQ-2 Score: 0  PHQ-9 Score:    Screening complete, no depression identified / no further action needed today Strict follow up precautions provided.   Follow-up as Needed  and Follow-up with PCP  Disposition Upon Discharge:  1.  Routine symptom specific, illness specific and/or disease specific instructions were discussed with the patient and/or  caregiver at length.  2.  The differential diagnosis for the patient's specific symptoms were reviewed and discussed with the patient/caregiver at length; the patient/caregiver expressed understanding of all discussed and their relevant questions were all satisfactorily answered.  3.  Return to care should the presenting symptoms recur, persist, or worsen in any way; or in the alternative, if new symptoms or complaints develop.  4.  The patient and any family present were given verbal and/or written discharge instructions as clinically indicated and appropriate.  5.  Continue OTC medications as needed and tolerated for control of the currently reported symptoms, if no conflict with the currently prescribed medications.

## 2023-09-12 ENCOUNTER — Encounter (HOSPITAL_COMMUNITY): Payer: Self-pay

## 2023-09-12 ENCOUNTER — Inpatient Hospital Stay (HOSPITAL_COMMUNITY)
Admission: EM | Admit: 2023-09-12 | Discharge: 2023-09-16 | DRG: 377 | Disposition: A | Discharge status: Home / Self Care | Attending: Internal Medicine | Admitting: Internal Medicine

## 2023-09-12 ENCOUNTER — Emergency Department (HOSPITAL_COMMUNITY)

## 2023-09-12 ENCOUNTER — Other Ambulatory Visit: Payer: Self-pay

## 2023-09-12 DIAGNOSIS — G4700 Insomnia, unspecified: Secondary | ICD-10-CM | POA: Diagnosis not present

## 2023-09-12 DIAGNOSIS — I252 Old myocardial infarction: Secondary | ICD-10-CM

## 2023-09-12 DIAGNOSIS — I700 Atherosclerosis of aorta: Secondary | ICD-10-CM | POA: Diagnosis present

## 2023-09-12 DIAGNOSIS — Z888 Allergy status to other drugs, medicaments and biological substances status: Secondary | ICD-10-CM | POA: Diagnosis not present

## 2023-09-12 DIAGNOSIS — Z79899 Other long term (current) drug therapy: Secondary | ICD-10-CM

## 2023-09-12 DIAGNOSIS — I4821 Permanent atrial fibrillation: Secondary | ICD-10-CM | POA: Diagnosis present

## 2023-09-12 DIAGNOSIS — F32A Depression, unspecified: Secondary | ICD-10-CM | POA: Diagnosis present

## 2023-09-12 DIAGNOSIS — Z886 Allergy status to analgesic agent status: Secondary | ICD-10-CM

## 2023-09-12 DIAGNOSIS — J811 Chronic pulmonary edema: Secondary | ICD-10-CM | POA: Diagnosis not present

## 2023-09-12 DIAGNOSIS — Z823 Family history of stroke: Secondary | ICD-10-CM | POA: Diagnosis not present

## 2023-09-12 DIAGNOSIS — I5023 Acute on chronic systolic (congestive) heart failure: Secondary | ICD-10-CM

## 2023-09-12 DIAGNOSIS — Z961 Presence of intraocular lens: Secondary | ICD-10-CM | POA: Diagnosis present

## 2023-09-12 DIAGNOSIS — N179 Acute kidney failure, unspecified: Secondary | ICD-10-CM | POA: Diagnosis present

## 2023-09-12 DIAGNOSIS — K922 Gastrointestinal hemorrhage, unspecified: Secondary | ICD-10-CM | POA: Diagnosis not present

## 2023-09-12 DIAGNOSIS — R079 Chest pain, unspecified: Secondary | ICD-10-CM

## 2023-09-12 DIAGNOSIS — Z8249 Family history of ischemic heart disease and other diseases of the circulatory system: Secondary | ICD-10-CM

## 2023-09-12 DIAGNOSIS — I482 Chronic atrial fibrillation, unspecified: Secondary | ICD-10-CM

## 2023-09-12 DIAGNOSIS — Z87891 Personal history of nicotine dependence: Secondary | ICD-10-CM

## 2023-09-12 DIAGNOSIS — Z01810 Encounter for preprocedural cardiovascular examination: Secondary | ICD-10-CM | POA: Diagnosis not present

## 2023-09-12 DIAGNOSIS — I25810 Atherosclerosis of coronary artery bypass graft(s) without angina pectoris: Secondary | ICD-10-CM | POA: Diagnosis present

## 2023-09-12 DIAGNOSIS — D62 Acute posthemorrhagic anemia: Secondary | ICD-10-CM | POA: Diagnosis present

## 2023-09-12 DIAGNOSIS — I5043 Acute on chronic combined systolic (congestive) and diastolic (congestive) heart failure: Secondary | ICD-10-CM | POA: Diagnosis present

## 2023-09-12 DIAGNOSIS — Z7984 Long term (current) use of oral hypoglycemic drugs: Secondary | ICD-10-CM

## 2023-09-12 DIAGNOSIS — N182 Chronic kidney disease, stage 2 (mild): Secondary | ICD-10-CM | POA: Diagnosis present

## 2023-09-12 DIAGNOSIS — R0789 Other chest pain: Secondary | ICD-10-CM | POA: Diagnosis not present

## 2023-09-12 DIAGNOSIS — J4489 Other specified chronic obstructive pulmonary disease: Secondary | ICD-10-CM | POA: Diagnosis present

## 2023-09-12 DIAGNOSIS — R197 Diarrhea, unspecified: Secondary | ICD-10-CM | POA: Diagnosis present

## 2023-09-12 DIAGNOSIS — I517 Cardiomegaly: Secondary | ICD-10-CM | POA: Diagnosis not present

## 2023-09-12 DIAGNOSIS — Z8673 Personal history of transient ischemic attack (TIA), and cerebral infarction without residual deficits: Secondary | ICD-10-CM

## 2023-09-12 DIAGNOSIS — I21A1 Myocardial infarction type 2: Secondary | ICD-10-CM | POA: Diagnosis present

## 2023-09-12 DIAGNOSIS — D72829 Elevated white blood cell count, unspecified: Secondary | ICD-10-CM

## 2023-09-12 DIAGNOSIS — K921 Melena: Secondary | ICD-10-CM | POA: Diagnosis present

## 2023-09-12 DIAGNOSIS — Z9841 Cataract extraction status, right eye: Secondary | ICD-10-CM

## 2023-09-12 DIAGNOSIS — R000 Tachycardia, unspecified: Secondary | ICD-10-CM | POA: Diagnosis present

## 2023-09-12 DIAGNOSIS — Z7982 Long term (current) use of aspirin: Secondary | ICD-10-CM

## 2023-09-12 DIAGNOSIS — Z9842 Cataract extraction status, left eye: Secondary | ICD-10-CM

## 2023-09-12 DIAGNOSIS — I2489 Other forms of acute ischemic heart disease: Secondary | ICD-10-CM | POA: Diagnosis not present

## 2023-09-12 DIAGNOSIS — I119 Hypertensive heart disease without heart failure: Secondary | ICD-10-CM

## 2023-09-12 DIAGNOSIS — N529 Male erectile dysfunction, unspecified: Secondary | ICD-10-CM | POA: Diagnosis present

## 2023-09-12 DIAGNOSIS — I2510 Atherosclerotic heart disease of native coronary artery without angina pectoris: Secondary | ICD-10-CM | POA: Diagnosis present

## 2023-09-12 DIAGNOSIS — I130 Hypertensive heart and chronic kidney disease with heart failure and stage 1 through stage 4 chronic kidney disease, or unspecified chronic kidney disease: Secondary | ICD-10-CM | POA: Diagnosis present

## 2023-09-12 DIAGNOSIS — R001 Bradycardia, unspecified: Secondary | ICD-10-CM | POA: Diagnosis present

## 2023-09-12 DIAGNOSIS — F419 Anxiety disorder, unspecified: Secondary | ICD-10-CM | POA: Diagnosis present

## 2023-09-12 DIAGNOSIS — D649 Anemia, unspecified: Principal | ICD-10-CM | POA: Diagnosis present

## 2023-09-12 DIAGNOSIS — R6884 Jaw pain: Secondary | ICD-10-CM | POA: Diagnosis present

## 2023-09-12 DIAGNOSIS — Z955 Presence of coronary angioplasty implant and graft: Secondary | ICD-10-CM | POA: Diagnosis not present

## 2023-09-12 DIAGNOSIS — I214 Non-ST elevation (NSTEMI) myocardial infarction: Secondary | ICD-10-CM | POA: Diagnosis present

## 2023-09-12 LAB — BASIC METABOLIC PANEL WITH GFR
Anion gap: 12 (ref 5–15)
BUN: 48 mg/dL — ABNORMAL HIGH (ref 8–23)
CO2: 20 mmol/L — ABNORMAL LOW (ref 22–32)
Calcium: 9 mg/dL (ref 8.9–10.3)
Chloride: 106 mmol/L (ref 98–111)
Creatinine, Ser: 1.55 mg/dL — ABNORMAL HIGH (ref 0.61–1.24)
GFR, Estimated: 45 mL/min — ABNORMAL LOW (ref >60)
Glucose, Bld: 170 mg/dL — ABNORMAL HIGH (ref 70–99)
Potassium: 4.4 mmol/L (ref 3.5–5.1)
Sodium: 138 mmol/L (ref 135–145)

## 2023-09-12 LAB — CBC
HCT: 21.6 % — ABNORMAL LOW (ref 39.0–52.0)
Hemoglobin: 6.7 g/dL — CL (ref 13.0–17.0)
MCH: 27.8 pg (ref 26.0–34.0)
MCHC: 31 g/dL (ref 30.0–36.0)
MCV: 89.6 fL (ref 80.0–100.0)
Platelets: 390 K/uL (ref 150–400)
RBC: 2.41 MIL/uL — ABNORMAL LOW (ref 4.22–5.81)
RDW: 15.1 % (ref 11.5–15.5)
WBC: 25.2 K/uL — ABNORMAL HIGH (ref 4.0–10.5)
nRBC: 0 % (ref 0.0–0.2)

## 2023-09-12 LAB — BRAIN NATRIURETIC PEPTIDE: B Natriuretic Peptide: 351.8 pg/mL — ABNORMAL HIGH (ref 0.0–100.0)

## 2023-09-12 LAB — HEMOGLOBIN AND HEMATOCRIT, BLOOD
HCT: 22.8 % — ABNORMAL LOW (ref 39.0–52.0)
Hemoglobin: 7.2 g/dL — ABNORMAL LOW (ref 13.0–17.0)

## 2023-09-12 LAB — PREPARE RBC (CROSSMATCH)

## 2023-09-12 LAB — TROPONIN I (HIGH SENSITIVITY)
Troponin I (High Sensitivity): 134 ng/L (ref <18)
Troponin I (High Sensitivity): 1437 ng/L (ref <18)

## 2023-09-12 MED ORDER — PANTOPRAZOLE SODIUM 40 MG IV SOLR
40.0000 mg | Freq: Two times a day (BID) | INTRAVENOUS | Status: DC
  Administered 2023-09-12 – 2023-09-15 (×8): 40 mg via INTRAVENOUS
  Filled 2023-09-12 (×8): qty 10

## 2023-09-12 MED ORDER — FUROSEMIDE 10 MG/ML IJ SOLN: 60.0000 mg | Freq: Every day | INTRAMUSCULAR | Status: DC

## 2023-09-12 MED ORDER — SODIUM CHLORIDE 0.9% IV SOLUTION: Freq: Once | INTRAVENOUS | Status: AC

## 2023-09-12 MED ORDER — METOPROLOL TARTRATE 12.5 MG HALF TABLET
12.5000 mg | ORAL_TABLET | Freq: Two times a day (BID) | ORAL | Status: DC
  Administered 2023-09-12 – 2023-09-14 (×4): 12.5 mg via ORAL
  Filled 2023-09-12 (×4): qty 1

## 2023-09-12 MED ORDER — FUROSEMIDE 10 MG/ML IJ SOLN
60.0000 mg | Freq: Once | INTRAMUSCULAR | Status: AC
  Administered 2023-09-12: 60 mg via INTRAVENOUS
  Filled 2023-09-12: qty 6

## 2023-09-12 MED ORDER — MORPHINE SULFATE (PF) 4 MG/ML IV SOLN
4.0000 mg | Freq: Once | INTRAVENOUS | Status: AC
  Administered 2023-09-12: 4 mg via INTRAVENOUS
  Filled 2023-09-12: qty 1

## 2023-09-12 MED ORDER — SODIUM CHLORIDE 0.9 % IV SOLN: INTRAVENOUS | Status: DC

## 2023-09-12 MED ORDER — ASPIRIN 81 MG PO CHEW: 324.0000 mg | CHEWABLE_TABLET | Freq: Once | ORAL | Status: DC

## 2023-09-12 MED ORDER — IPRATROPIUM-ALBUTEROL 0.5-2.5 (3) MG/3ML IN SOLN
3.0000 mL | Freq: Four times a day (QID) | RESPIRATORY_TRACT | Status: DC | PRN
  Administered 2023-09-13: 3 mL via RESPIRATORY_TRACT
  Filled 2023-09-12: qty 3

## 2023-09-12 MED ORDER — UMECLIDINIUM BROMIDE 62.5 MCG/ACT IN AEPB
1.0000 | INHALATION_SPRAY | Freq: Every day | RESPIRATORY_TRACT | Status: DC
  Administered 2023-09-12 – 2023-09-16 (×3): 1 via RESPIRATORY_TRACT
  Filled 2023-09-12 (×2): qty 7

## 2023-09-12 MED ORDER — ARFORMOTEROL TARTRATE 15 MCG/2ML IN NEBU
15.0000 ug | INHALATION_SOLUTION | Freq: Two times a day (BID) | RESPIRATORY_TRACT | Status: DC
  Administered 2023-09-12 – 2023-09-16 (×8): 15 ug via RESPIRATORY_TRACT
  Filled 2023-09-12 (×9): qty 2

## 2023-09-12 NOTE — Consult Note (Addendum)
 Consultation Note   Referring Provider:  [facility] PCP: [name], MD Primary Gastroenterologist:    Sampson (seen by Dr. San inpatient only)      Reason for Consultation:   Anemia DOA: 09/12/2023         Hospital Day: 1   ASSESSMENT    83 year old male with hemodynamically stable GI bleed, presumably lower but upper source not excluded.   Acute on chronic normocytic anemia in setting of what sounds to be worsening of subacute GI bleeding Presenting hemoglobin 6.7, down from baseline of around 10.7.   CAD status post CABG and DES NSTEMI in setting of severe anemia.   Similar presentation in November 2024 Cardiology has evaluated. His ECG shows signs of global ischemia.  Suspected to have demand ischemia Hs Troponin 134 ->1437  Acute on chronic systolic heart heart failure Bilateral lower extremity edema.  Chest x-ray with mild bilateral pulmonary edema and suspected small effusions  Leukocytosis WBC 25K, ? Reactive  See PMH for additional history  Principal Problem:   Symptomatic anemia Active Problems:   NSTEMI (non-ST elevated myocardial infarction) (HCC)     PLAN:   --He has received a unit of blood , we will see what follow-up H&H shows  -- Probably not an upper GI bleed but would continue home PPI --Not actively bleeding.  DRE negative for blood  /black stool . If he does have recurrent bleeding could consider a CTA though right now appears to have AKI so not ideal.  He is high risk candidate for endoscopic evaluation.  However may need to consider as this is the second time within one year that he has presented with GI bleed /severe anemia and he has not had endoscopic evaluation in several years  HPI   83 y.o. year old male with a medical history including but not limited to CAD status post CABG and DES in 2017, atrial fibrillation, hypertension, CVA history lower GI bleeding  Brief GI  history  Kahmari was hospitalized November 2024 with chest pain / NSTEMI in setting of GI bleed and anemia with hgb around 6.  Seen by [facility]  and per their note he had a prior similar presentation at an outside hospital in April 2023 with CT angio showing active bleeding at the splenic flexure and extensive diverticulosis.  Ultimately transferred to [facility] due to his increased risk for endoscopic procedures.  Upon arrival he was seen by our team for further evaluation of GI bleed, see our 01/20/2023 consult note.  Per IR note the patient's son mentioned he had had previous gastrointestinal bleeding back in 2010-2012 and had undergone multiple colonoscopies in [facility].  We felt his bleeding was probably diverticular in nature.  We did not proceed with endoscopic evaluation.   Interval history Patient brought by EMS to ED early this a.m. with chest pressure radiating to jaw and neck.  Also complained of bright red blood in stools.  Patient is a limited historian (some memory issues).  Son is in the room and helps provide the history.  In the ED he was found to have recurrent acute on chronic anemia and markedly elevated high-sensitivity troponins.   Patient does state that he has been having  some bleeding from his rectum for a couple of days.  His son offers more details.  Sounds like patient has been bleeding periodically for an unknown specified amount of time but over the last few days the bleeding progressed.  Patient describes onset of bright red blood per rectum about a week ago.  Sounds like the blood turned more burgundy in color the following day.  He has not had a bowel movement since last night and that one was reportedly brown in color without blood.  Patient has not had any abdominal pain.  He did have an episode of nausea and vomiting the day yesterday prior to coming to ED. no emesis, he vomited food.  Patient is not anticoagulated.  He does take daily pantoprazole  for chronic   stomach aches   Relevant workup thus far: Hemoglobin 6.7, down from 10.7 in February of this year.  WBC 25,000, BUN 48, creatinine 1.55, HS troponin 134>> 1437   Labs and Imaging: Recent Labs    09/12/23 0407  WBC 25.2*  HGB 6.7*  HCT 21.6*  MCV 89.6  PLT 390   Recent Labs    09/12/23 0407  NA 138  K 4.4  CL 106  CO2 20*  GLUCOSE 170*  BUN 48*  CREATININE 1.55*  CALCIUM 9.0     DG Chest Port 1 View EXAM: 1 VIEW XRAY OF THE CHEST 09/12/2023 04:27:42 AM  COMPARISON: 1 view chest x-ray dated 09/19/2022.  CLINICAL HISTORY: CP.  FINDINGS:  LUNGS AND PLEURA: Mild edema is present bilaterally. Small effusions are suspected.  HEART AND MEDIASTINUM: The heart is enlarged. Atherosclerotic calcifications are present at the aortic arch.  BONES AND SOFT TISSUES: No acute osseous abnormality.  IMPRESSION: 1. Cardiomegaly with mild bilateral pulmonary edema and suspected small effusions, consistent with congestive heart failure . 2. Atherosclerosis.  Electronically signed by: Lonni Necessary MD 09/12/2023 04:44 AM EDT RP Workstation: [id number]   Pertinent GI Studies   No recent endoscopic evaluation.  Nothing in epic.  Sounds like his last colonoscopy was greater than 10 years ago.   Past Medical History:  Diagnosis Date   Acute lower GI bleeding 2008   Anxiety    Asthma    Carotid artery disease (HCC)    Asymptomatic left carotid bruit   COPD (chronic obstructive pulmonary disease) (HCC)    Coronary artery disease    a. CABG - 1999 with LIMA-LAD, SVG-DIAG, SVG-OM, SVG-RPDA  b. Cath in setting of NSTEMI 03/18/2015: patent LIMA-LAD, occluded SVG-RCA, occluded SVG-D1, 99% stenosis of SVG-OM treated w/ DES   Depression    Dyslipidemia    Erectile dysfunction    Essential hypertension    History of blood transfusion 2008   History of stroke    Hyperlipidemia    Myocardial infarction [facility])     Past Surgical History:  Procedure Laterality Date    CARDIAC CATHETERIZATION     CARDIAC CATHETERIZATION N/A 03/18/2015   Procedure: Left Heart Cath and Cors/Grafts Angiography;  Surgeon: Alm LELON Clay, MD;  Location: [facility] INVASIVE CV LAB;  Service: Cardiovascular;  Laterality: N/A;   CARDIAC CATHETERIZATION N/A 03/18/2015   Procedure: Coronary Stent Intervention;  Surgeon: Alm LELON Clay, MD;  Location: [facility] INVASIVE CV LAB;  Service: Cardiovascular;  Laterality: N/A;   CATARACT EXTRACTION W/ INTRAOCULAR LENS  IMPLANT, BILATERAL Bilateral    CORONARY ANGIOPLASTY     CORONARY ARTERY BYPASS GRAFT  1999   CORONARY STENT INTERVENTION N/A 04/22/2020   Procedure: CORONARY STENT INTERVENTION;  Surgeon: Claudene Victory ORN, MD;  Location: [facility] INVASIVE CV LAB;  Service: Cardiovascular;  Laterality: N/A;   FRACTURE SURGERY     HERNIA REPAIR     LEFT HEART CATH AND CORS/GRAFTS ANGIOGRAPHY N/A 04/22/2020   Procedure: LEFT HEART CATH AND CORS/GRAFTS ANGIOGRAPHY;  Surgeon: Claudene Victory ORN, MD;  Location: [facility] INVASIVE CV LAB;  Service: Cardiovascular;  Laterality: N/A;   TIBIA FRACTURE SURGERY Left [facility]; took bone out of my right hip   TONSILLECTOMY     UMBILICAL HERNIA REPAIR  ~ 2000    Family History  Problem Relation Age of Onset   Heart disease Father    Heart attack Father    Hypertension Mother    Stroke Mother     Prior to Admission medications   Medication Sig Start Date End Date Taking? Authorizing Provider  albuterol  (VENTOLIN  HFA) 108 (90 Base) MCG/ACT inhaler Inhale 2 puffs into the lungs every 6 (six) hours as needed for wheezing or shortness of breath.   Yes [provider]  ALPRAZolam  (XANAX ) 1 MG tablet Take 1 tablet (1 mg total) by mouth at bedtime as needed for anxiety. Patient taking differently: Take 0.5-1 mg by mouth every 8 (eight) hours as needed for anxiety. 11/01/16  Yes Triplett, Tammy, PA-C  amLODipine  (NORVASC ) 5 MG tablet Take 5 mg by mouth daily. 03/18/23  Yes [provider]  aspirin  EC 81 MG  tablet Take 1 tablet (81 mg total) by mouth daily. 02/12/17  Yes Debera Jayson MATSU, MD  furosemide  (LASIX ) 20 MG tablet Take 20 mg by mouth daily.   Yes [provider]  ipratropium-albuterol  (DUONEB) 0.5-2.5 (3) MG/3ML SOLN Take 3 mLs by nebulization every 4 (four) hours as needed (wheezing, shortness of breath). 03/11/23  Yes [provider]  isosorbide  mononitrate (IMDUR ) 30 MG 24 hr tablet TAKE 1 TABLET BY MOUTH TWICE DAILY 03/08/23  Yes Debera Jayson MATSU, MD  Tiotropium Bromide -Olodaterol (STIOLTO RESPIMAT ) 2.5-2.5 MCG/ACT AERS Inhale 2 puffs into the lungs daily.   Yes [provider]  furosemide  (LASIX ) 40 MG tablet TAKE 1 TABLET BY MOUTH DAILY - EMERGENCY FILL FAXED MD Patient not taking: Reported on 09/12/2023 08/20/23   Debera Jayson MATSU, MD  NEXLIZET 180-10 MG TABS Take 1 tablet by mouth daily. 07/23/22   [provider]  nitroGLYCERIN  (NITROSTAT ) 0.4 MG SL tablet DISSOLVE 1 TABLET UNDER THE TONGUE EVERY 5 MINUTES AS NEEDED FOR CHEST PAIN. DO NOT EXCEED 3 DOSES IN 15 MIN. CALL 911 IF NO RELIEF 04/19/23   Debera Jayson MATSU, MD  Omega 3 1000 MG CAPS Take 1 capsule by mouth daily.    [provider]  pantoprazole  (PROTONIX ) 40 MG tablet TAKE 1 TABLET BY MOUTH EVERY DAY 02/09/19   Debera Jayson MATSU, MD  potassium chloride  SA (KLOR-CON  M) 20 MEQ tablet TAKE 1 TABLET BY MOUTH TWICE DAILY EMERGENCY FILL - FAXED MD 08/20/23   Debera Jayson MATSU, MD  Zinc  50 MG CAPS Take 1 capsule by mouth daily.    [provider]    Current Facility-Administered Medications  Medication Dose Route Frequency Provider Last Rate Last Admin   0.9 %  sodium chloride  infusion (Manually program via Guardrails IV Fluids)   Intravenous Once Countryman, Chase, MD   Held at 09/12/23 0807   arformoterol  (BROVANA ) nebulizer solution 15 mcg  15 mcg Nebulization BID Georgina Basket, MD       And   umeclidinium bromide  (INCRUSE ELLIPTA ) 62.5 MCG/ACT  1 puff  1 puff Inhalation  Daily Georgina Basket, MD       ipratropium-albuterol  (DUONEB) 0.5-2.5 (3) MG/3ML nebulizer solution 3 mL  3 mL Nebulization Q6H PRN Georgina Basket, MD       metoprolol  tartrate (LOPRESSOR ) tablet 12.5 mg  12.5 mg Oral BID Georgina Basket, MD       pantoprazole  (PROTONIX ) injection 40 mg  40 mg Intravenous Q12H Georgina Basket, MD       Current Outpatient Medications  Medication Sig Dispense Refill   albuterol  (VENTOLIN  HFA) 108 (90 Base) MCG/ACT inhaler Inhale 2 puffs into the lungs every 6 (six) hours as needed for wheezing or shortness of breath.     ALPRAZolam  (XANAX ) 1 MG tablet Take 1 tablet (1 mg total) by mouth at bedtime as needed for anxiety. (Patient taking differently: Take 0.5-1 mg by mouth every 8 (eight) hours as needed for anxiety.) 10 tablet 0   amLODipine  (NORVASC ) 5 MG tablet Take 5 mg by mouth daily.     aspirin  EC 81 MG tablet Take 1 tablet (81 mg total) by mouth daily. 90 tablet 3   furosemide  (LASIX ) 20 MG tablet Take 20 mg by mouth daily.     ipratropium-albuterol  (DUONEB) 0.5-2.5 (3) MG/3ML SOLN Take 3 mLs by nebulization every 4 (four) hours as needed (wheezing, shortness of breath).     isosorbide  mononitrate (IMDUR ) 30 MG 24 hr tablet TAKE 1 TABLET BY MOUTH TWICE DAILY 180 tablet 3   Tiotropium Bromide -Olodaterol (STIOLTO RESPIMAT ) 2.5-2.5 MCG/ACT AERS Inhale 2 puffs into the lungs daily.     furosemide  (LASIX ) 40 MG tablet TAKE 1 TABLET BY MOUTH DAILY - EMERGENCY FILL FAXED MD (Patient not taking: Reported on 09/12/2023) 30 tablet 2   NEXLIZET 180-10 MG TABS Take 1 tablet by mouth daily.     nitroGLYCERIN  (NITROSTAT ) 0.4 MG SL tablet DISSOLVE 1 TABLET UNDER THE TONGUE EVERY 5 MINUTES AS NEEDED FOR CHEST PAIN. DO NOT EXCEED 3 DOSES IN 15 MIN. CALL 911 IF NO RELIEF 25 tablet 3   Omega 3 1000 MG CAPS Take 1 capsule by mouth daily.     pantoprazole  (PROTONIX ) 40 MG tablet TAKE 1 TABLET BY MOUTH EVERY DAY 90 tablet 1   potassium chloride  SA (KLOR-CON  M) 20 MEQ tablet TAKE 1 TABLET  BY MOUTH TWICE DAILY EMERGENCY FILL - FAXED MD 60 tablet 3   Zinc  50 MG CAPS Take 1 capsule by mouth daily.      Allergies as of 09/12/2023 - Review Complete 09/12/2023  Allergen Reaction Noted   Anoro ellipta  [umeclidinium-vilanterol] Other (See Comments) 03/17/2015   Lipitor [atorvastatin calcium] Other (See Comments) 06/27/2010   Niaspan [niacin] Other (See Comments) 07/03/2010   Tricor [fenofibrate] Other (See Comments) 09/12/2023   Zocor [simvastatin] Other (See Comments) 07/03/2010    Social History   Socioeconomic History   Marital status: Married    Spouse name: Not on file   Number of children: Not on file   Years of education: Not on file   Highest education level: Not on file  Occupational History   Occupation: Welder x 10 yrs   Tobacco Use   Smoking status: Former    Types: Cigars    Quit date: 03/02/1997    Years since quitting: 26.5    Passive exposure: Never   Smokeless tobacco: Never   Tobacco comments:    smoked cigars x 10 yrs, never smoked cigarettes  Vaping Use   Vaping status: Never Used  Substance and Sexual  Activity   Alcohol use: No    Alcohol/week: 0.0 standard drinks of alcohol    Comment: drank some when I was young; quit in the 1990s   Drug use: No   Sexual activity: Not Currently  Other Topics Concern   Not on file  Social History Narrative   Not on file   Social Drivers of Health   Financial Resource Strain: Not on file  Food Insecurity: No Food Insecurity (01/17/2023)   Hunger Vital Sign    Worried About Running Out of Food in the Last Year: Never true    Ran Out of Food in the Last Year: Never true  Transportation Needs: No Transportation Needs (01/17/2023)   PRAPARE - Administrator, Civil Service (Medical): No    Lack of Transportation (Non-Medical): No  Physical Activity: Not on file  Stress: Not on file  Social Connections: Not on file  Intimate Partner Violence: Not At Risk (01/17/2023)   Humiliation, Afraid,  Rape, and Kick questionnaire    Fear of Current or Ex-Partner: No    Emotionally Abused: No    Physically Abused: No    Sexually Abused: No     Code Status   Code Status: Full Code  Review of Systems: All systems reviewed and negative except where noted in HPI.  Physical Exam: Vital signs in last 24 hours: Temp:  [98 F (36.7 C)-98.2 F (36.8 C)] 98.1 F (36.7 C) (07/13 0655) Pulse Rate:  [87-102] 96 (07/13 0655) Resp:  [16-20] 19 (07/13 0655) BP: (120-137)/(70-84) 127/70 (07/13 0655) SpO2:  [96 %-100 %] 99 % (07/13 0655) Weight:  [77.6 kg] 77.6 kg (07/13 0401)    General:  Pleasant male in NAD Psych:  Cooperative. Normal mood and affect Eyes: Pupils equal Ears:  Normal auditory acuity Nose: No deformity, discharge or lesions Neck:  Supple, no masses felt Lungs:  Clear to auscultation.  Heart:  Regular rate, regular rhythm.  Abdomen:  Soft, nondistended, nontender, active bowel sounds, no masses felt Rectal : Scant amount of light residual stool in vault.  No blood  /dark stool found Msk: Symmetrical without gross deformities.  Neurologic:  Alert, oriented, grossly normal neurologically Extremities : Pitting edema of bilateral lower extremities  Skin:  Intact without significant lesions.    Intake/Output from previous day: No intake/output data recorded. Intake/Output this shift:  No intake/output data recorded.   Vina Dasen, NP-C   09/12/2023, 8:33 AM  I have taken an interval history, thoroughly reviewed the chart and examined the patient. I agree with the Advanced Practitioner's note, impression and recommendations, and have recorded additional findings, impressions and recommendations below. I performed a substantive portion of this encounter (>50% time spent), including a complete performance of the medical decision making.  My additional thoughts are as follows:  Hematochezia with acute blood loss anemia and GI bleeding episode causing non-STEMI.   Patient also somewhat volume depleted with worsening lower extremity edema.  Cardiology consultants finishing their evaluation at the time I came to see him with his son.  Patient has had similar presentations that have been believed to be diverticular in nature, and this one likely is as well.  Colonic AVMs to be considered as possible source.  We had a long discussion about available testing trying to catch a diverticular bleed when it happens, and in the future I hope he may be able to come to the hospital sooner rather than bleed at home for days.  (Understanding some apparent memory  and cognitive challenges here) He seems to have stopped overtly bleeding at this point and I agree it is not likely an upper GI source based on his overall clinical presentation. Has AKI, so no CTA at this point if he should have recurrent brisk bleeding.  Would need to be tagged RBC scan that, if positive, might at least localize the approximate area of the source.  Though a full angiogram to follow that would risk ATN. Nevertheless, colonoscopy typically of low yield in this situation when there is a suspected diagnosis, and is also high risk with his acute cardiac event. So no immediate plans for colonoscopy, and I have told both him and his son that he might not have one at all of the risk benefit ratio continues to be unfavorable.  Our service will follow, Dr. Suzann coming on duty tomorrow.  Victory LITTIE Brand III Office:[phone]

## 2023-09-12 NOTE — ED Notes (Signed)
 CCMD called.

## 2023-09-12 NOTE — ED Provider Notes (Signed)
 [facility] EMERGENCY DEPARTMENT AT [facility] Provider Note   CSN: [id number] Arrival date & time: 09/12/23  9647     History Chief Complaint  Patient presents with   Chest Pain    HPI William Elliott is a 83 y.o. male presenting for chief complaint of chest pain. History of similar. CAD s/p bypass in 1999. Patient states that he has had approximately 10 days of bright red blood per rectum and that it stopped yesterday.  States that he started having chest pain overnight.  Very similar presentation in the past where he had an NSTEMI last November secondary to acute blood loss anemia.  At that time, the bleeding was not stopping during the hospitalization he required multiple transfusions.  This time, he states that it was much less bleeding than prior evaluations and that he was hopeful would resolve spontaneously until the chest pain worsened into the evening. He was discharged on aspirin  but is not on anticoagulation secondary to the above hospitalizations for bleeding and history of lower GI bleeds. [name] recorded medical, surgical, social, medication list and allergies were reviewed in the Snapshot window as part of the initial history.   Review of Systems   Review of Systems  Constitutional:  Negative for chills and fever.  HENT:  Negative for ear pain and sore throat.   Eyes:  Negative for pain and visual disturbance.  Respiratory:  Negative for cough and shortness of breath.   Cardiovascular:  Positive for chest pain. Negative for palpitations.  Gastrointestinal:  Positive for blood in stool. Negative for abdominal pain and vomiting.  Genitourinary:  Negative for dysuria and hematuria.  Musculoskeletal:  Negative for arthralgias and back pain.  Skin:  Negative for color change and rash.  Neurological:  Negative for seizures and syncope.  All other systems reviewed and are negative.   Physical Exam Updated Vital Signs BP 135/71   Pulse 95   Temp 98 F  (36.7 C) (Oral)   Resp 20   Ht 5' 10 (1.778 m)   Wt 77.6 kg   SpO2 97%   BMI 24.54 kg/m  Physical Exam Vitals and nursing note reviewed.  Constitutional:      General: He is not in acute distress.    Appearance: He is well-developed.  HENT:     Head: Normocephalic and atraumatic.  Eyes:     Conjunctiva/sclera: Conjunctivae normal.  Cardiovascular:     Rate and Rhythm: Normal rate and regular rhythm.     Heart sounds: No murmur heard. Pulmonary:     Effort: Pulmonary effort is normal. No respiratory distress.     Breath sounds: Normal breath sounds.  Abdominal:     Palpations: Abdomen is soft.     Tenderness: There is no abdominal tenderness.  Musculoskeletal:        General: No swelling.     Cervical back: Neck supple.  Skin:    General: Skin is warm and dry.     Capillary Refill: Capillary refill takes less than 2 seconds.  Neurological:     Mental Status: He is alert.  Psychiatric:        Mood and Affect: Mood normal.      ED Course/ Medical Decision Making/ A&P    Procedures .Critical Care  Performed by: Jerral Meth, MD Authorized by: Jerral Meth, MD   Critical care provider statement:    Critical care time (minutes):  90   Critical care was time spent personally by me on  the following activities:  Development of treatment plan with patient or surrogate, discussions with consultants, evaluation of patient's response to treatment, examination of patient, ordering and review of laboratory studies, ordering and review of radiographic studies, ordering and performing treatments and interventions, pulse oximetry, re-evaluation of patient's condition, review of old charts and obtaining history from patient or surrogate   Care discussed with: admitting provider      Medications Ordered in ED Medications  0.9 %  sodium chloride  infusion (Manually program via Guardrails IV Fluids) (has no administration in time range)  morphine  (PF) 4 MG/ML injection 4  mg (has no administration in time range)    Medical Decision Making:   This is a critically ill 83 year old male.  Presenting with comorbid bright red blood per rectum/lower GI bleeding as well as chest pain and EKG changes with global depressions (no ST elevations) He is medically limited in interventions due to his GI bleeding. Discussed with patient and family at bedside.  He is consented for blood transfusion for stabilization. Message to GI for a.m. evaluation though the bleeding has ceased rectally.  Consult to cardiology for recommendations regarding management of his NSTEMI though likely type II secondary to his anemia and tachycardia. Will arrange for admission to medicine in the interim for ongoing care and management.  Disposition:   Based on the above findings, I believe this patient is stable for admission.    Patient/family educated about specific findings on our evaluation and explained exact reasons for admission.  Patient/family educated about clinical situation and time was allowed to answer questions.   Admission team communicated with and agreed with need for admission. Patient admitted. Patient  ready to move at this time.     Emergency Department Medication Summary:   Medications  0.9 %  sodium chloride  infusion (Manually program via Guardrails IV Fluids) (has no administration in time range)  morphine  (PF) 4 MG/ML injection 4 mg (has no administration in time range)         Clinical Impression:  1. Anemia, unspecified type   2. Nonspecific chest pain      Data Unavailable   Final Clinical Impression(s) / ED Diagnoses Final diagnoses:  Anemia, unspecified type  Nonspecific chest pain    Rx / DC Orders ED Discharge Orders     None         Jerral Meth, MD 09/12/23 [phone]

## 2023-09-12 NOTE — ED Notes (Signed)
Cards PA at bedside. 

## 2023-09-12 NOTE — ED Notes (Signed)
 Pharmacy messaged regarding missing incruse inhaler

## 2023-09-12 NOTE — H&P (Signed)
 History and Physical    Patient: William Elliott FMW:[id number] DOB: January 09, 1941 DOA: 09/12/2023 DOS: the patient was seen and examined on 09/12/2023 PCP: William Norleen PEDLAR, MD  Patient coming from: Home  Chief Complaint:  Chief Complaint  Patient presents with   Chest Pain   HPI: William Elliott is a 83 y.o. male with medical history significant of PAF (not on OAC due to history of GI bleed), CAD s/p 4vCABG, HTN, HLD, COPD, bilateral carotid artery stenosis, history of CVA, history of GI bleeding, who was last admitted on 01/17/2023 with chest pain/NSTEMI in the setting of GIB p/w recurrent NSTEMI and GIB.  Pt states that he was in his USOH until this morning when he awoke to cp. He took 3 doses of his SLN over 15 minutes, and when his cp did not resolve, he notified his wife, who then called his son, William Elliott, who activated EMS. Of note, pt reports that over the past week he has had BRBPR but did not present to the ED as he didn't want to be admitted. He is certain the BRBPR stopped on Saturday evening. He denied dizziness/LOC.  In the ED, pt AFVSS. Labs notable for Cr 1.55, BNP 351.8, troponin 134-->1437, WBC 25.2 and Hb 6.7. CXR showed b/l pulmonary edema and b/l pleural effusions c/w ADHF. EDP transfused 1U PRBC, consulted GI/Cards and admitted to medicine.    Review of Systems: As mentioned in the history of present illness. All other systems reviewed and are negative. Past Medical History:  Diagnosis Date   Acute lower GI bleeding 2008   Anxiety    Asthma    Carotid artery disease (HCC)    Asymptomatic left carotid bruit   COPD (chronic obstructive pulmonary disease) (HCC)    Coronary artery disease    a. CABG - 1999 with LIMA-LAD, SVG-DIAG, SVG-OM, SVG-RPDA  b. Cath in setting of NSTEMI 03/18/2015: patent LIMA-LAD, occluded SVG-RCA, occluded SVG-D1, 99% stenosis of SVG-OM treated w/ DES   Depression    Dyslipidemia    Erectile dysfunction    Essential hypertension    History of blood  transfusion 2008   History of stroke    Hyperlipidemia    Myocardial infarction [facility])    Past Surgical History:  Procedure Laterality Date   CARDIAC CATHETERIZATION     CARDIAC CATHETERIZATION N/A 03/18/2015   Procedure: Left Heart Cath and Cors/Grafts Angiography;  Surgeon: Alm LELON Clay, MD;  Location: [facility] INVASIVE CV LAB;  Service: Cardiovascular;  Laterality: N/A;   CARDIAC CATHETERIZATION N/A 03/18/2015   Procedure: Coronary Stent Intervention;  Surgeon: Alm LELON Clay, MD;  Location: [facility] INVASIVE CV LAB;  Service: Cardiovascular;  Laterality: N/A;   CATARACT EXTRACTION W/ INTRAOCULAR LENS  IMPLANT, BILATERAL Bilateral    CORONARY ANGIOPLASTY     CORONARY ARTERY BYPASS GRAFT  1999   CORONARY STENT INTERVENTION N/A 04/22/2020   Procedure: CORONARY STENT INTERVENTION;  Surgeon: Claudene Victory LELON, MD;  Location: [facility] INVASIVE CV LAB;  Service: Cardiovascular;  Laterality: N/A;   FRACTURE SURGERY     HERNIA REPAIR     LEFT HEART CATH AND CORS/GRAFTS ANGIOGRAPHY N/A 04/22/2020   Procedure: LEFT HEART CATH AND CORS/GRAFTS ANGIOGRAPHY;  Surgeon: Claudene Victory LELON, MD;  Location: [facility] INVASIVE CV LAB;  Service: Cardiovascular;  Laterality: N/A;   TIBIA FRACTURE SURGERY Left [facility]; took bone out of my right hip   TONSILLECTOMY     UMBILICAL HERNIA REPAIR  ~ 2000   Social  History:  reports that he quit smoking about 26 years ago. His smoking use included cigars. He has never been exposed to tobacco smoke. He has never used smokeless tobacco. He reports that he does not drink alcohol and does not use drugs.  Allergies  Allergen Reactions   Anoro Ellipta  [Umeclidinium-Vilanterol] Other (See Comments)    Cardiac problems   Lipitor [Atorvastatin Calcium] Other (See Comments)    Not known    Niaspan [Niacin] Other (See Comments)    Not known   Zocor [Simvastatin] Other (See Comments)    Not known   Fenofibrate Other (See Comments)    No energy    Family History  Problem  Relation Age of Onset   Heart disease Father    Heart attack Father    Hypertension Mother    Stroke Mother     Prior to Admission medications   Medication Sig Start Date End Date Taking? Authorizing Provider  albuterol  (VENTOLIN  HFA) 108 (90 Base) MCG/ACT inhaler Inhale 2 puffs into the lungs every 6 (six) hours as needed for wheezing or shortness of breath.    [provider]  ALPRAZolam  (XANAX ) 1 MG tablet Take 1 tablet (1 mg total) by mouth at bedtime as needed for anxiety. Patient taking differently: Take 1 mg by mouth 2 (two) times daily as needed for anxiety. 11/01/16   Triplett, Tammy, PA-C  amLODipine  (NORVASC ) 5 MG tablet Take 5 mg by mouth daily. 03/18/23   [provider]  ascorbic acid  (VITAMIN C ) 500 MG tablet Take 500 mg by mouth daily.    [provider]  aspirin  EC 81 MG tablet Take 1 tablet (81 mg total) by mouth daily. 02/12/17   Debera Jayson MATSU, MD  b complex vitamins tablet Take 1 tablet by mouth daily.    [provider]  calcium carbonate (TUMS - DOSED IN MG ELEMENTAL CALCIUM) 500 MG chewable tablet Chew 1 tablet by mouth daily as needed for indigestion or heartburn.    [provider]  Cholecalciferol  (VITAMIN D3) 25 MCG (1000 UT) CAPS Take 1 capsule by mouth in the morning and at bedtime.    [provider]  diphenhydrAMINE  (BENADRYL ) 25 mg capsule Take 50 mg by mouth every 6 (six) hours as needed for sleep.    [provider]  FEROSUL 325 (65 Fe) MG tablet Take 325 mg by mouth daily. 05/23/21   [provider]  furosemide  (LASIX ) 40 MG tablet TAKE 1 TABLET BY MOUTH DAILY - EMERGENCY FILL FAXED MD 08/20/23   Debera Jayson MATSU, MD  ipratropium-albuterol  (DUONEB) 0.5-2.5 (3) MG/3ML SOLN Take 3 mLs by nebulization every 4 (four) hours as needed. 03/11/23   [provider]  isosorbide  mononitrate (IMDUR ) 30 MG 24 hr tablet TAKE 1 TABLET BY MOUTH TWICE DAILY 03/08/23   Debera Jayson MATSU, MD   MAGNESIUM  OXIDE PO Take 1 tablet by mouth daily.    [provider]  metoprolol  tartrate (LOPRESSOR ) 25 MG tablet Take 0.5 tablets (12.5 mg total) by mouth 2 (two) times daily. 01/22/23   Leotis Bogus, MD  multivitamin (THERAGRAN) per tablet Take 1 tablet by mouth daily.    [provider]  NEXLIZET 180-10 MG TABS Take 1 tablet by mouth daily. 07/23/22   [provider]  nitroGLYCERIN  (NITROSTAT ) 0.4 MG SL tablet DISSOLVE 1 TABLET UNDER THE TONGUE EVERY 5 MINUTES AS NEEDED FOR CHEST PAIN. DO NOT EXCEED 3 DOSES IN 15 MIN. CALL 911 IF NO RELIEF 04/19/23  Debera Jayson MATSU, MD  Omega 3 1000 MG CAPS Take 1 capsule by mouth daily.    [provider]  pantoprazole  (PROTONIX ) 40 MG tablet TAKE 1 TABLET BY MOUTH EVERY DAY 02/09/19   Debera Jayson MATSU, MD  potassium chloride  SA (KLOR-CON  M) 20 MEQ tablet TAKE 1 TABLET BY MOUTH TWICE DAILY EMERGENCY FILL - FAXED MD 08/20/23   Debera Jayson MATSU, MD  Tiotropium Bromide -Olodaterol (STIOLTO RESPIMAT ) 2.5-2.5 MCG/ACT AERS Inhale 1 puff into the lungs 2 (two) times a day.    [provider]  Zinc  50 MG CAPS Take 1 capsule by mouth daily.    [provider]    Physical Exam: Vitals:   09/12/23 0530 09/12/23 0600 09/12/23 0633 09/12/23 0655  BP: 132/70 135/71 120/71 127/70  Pulse: 100 95 99 96  Resp: 19 20 19 19   Temp:   98.2 F (36.8 C) 98.1 F (36.7 C)  TempSrc:   Oral Oral  SpO2: 98% 97% 96% 99%  Weight:      Height:       General: Alert, oriented x3, resting comfortably in no acute distress Respiratory: Bibasilar rales; no wheezing Cardiovascular: Regular rate and rhythm w/o m/r/g Abdomen: Soft, nontender, nondistended. Positive bowel sounds MSK: BLE 1+ pitting edema to calves   Data Reviewed:  Lab Results  Component Value Date   WBC 25.2 (H) 09/12/2023   HGB 6.7 (LL) 09/12/2023   HCT 21.6 (L) 09/12/2023   MCV 89.6 09/12/2023   PLT 390 09/12/2023   Lab Results  Component Value  Date   GLUCOSE 170 (H) 09/12/2023   CALCIUM 9.0 09/12/2023   NA 138 09/12/2023   K 4.4 09/12/2023   CO2 20 (L) 09/12/2023   CL 106 09/12/2023   BUN 48 (H) 09/12/2023   CREATININE 1.55 (H) 09/12/2023   Lab Results  Component Value Date   ALT 14 01/17/2023   AST 20 01/17/2023   ALKPHOS 47 01/17/2023   BILITOT 0.9 01/17/2023   Lab Results  Component Value Date   INR 1.2 01/17/2023   INR 0.98 03/13/2015    Radiology: DG Chest Port 1 View Result Date: 09/12/2023 EXAM: 1 VIEW XRAY OF THE CHEST 09/12/2023 04:27:42 AM COMPARISON: 1 view chest x-ray dated 09/19/2022. CLINICAL HISTORY: CP. FINDINGS: LUNGS AND PLEURA: Mild edema is present bilaterally. Small effusions are suspected. HEART AND MEDIASTINUM: The heart is enlarged. Atherosclerotic calcifications are present at the aortic arch. BONES AND SOFT TISSUES: No acute osseous abnormality. IMPRESSION: 1. Cardiomegaly with mild bilateral pulmonary edema and suspected small effusions, consistent with congestive heart failure . 2. Atherosclerosis. Electronically signed by: Lonni Necessary MD 09/12/2023 04:44 AM EDT RP Workstation: [id number]    Assessment and Plan: 72M h/o PAF (not on OAC due to history of GI bleed), CAD s/p 4vCABG, HTN, HLD, COPD, bilateral carotid artery stenosis, history of CVA, history of GI bleeding, who was last admitted on 01/17/2023 with chest pain/NSTEMI in the setting of GIB p/w recurrent NSTEMI and GIB.  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 Inpatient status is judged to be reasonable and necessary in order to provide the required intensity of service to ensure the patient's safety. The patient's presenting symptoms, physical exam findings, and initial radiographic and laboratory data in the context of their chronic comorbidities is felt to place them at high risk for further clinical deterioration. Furthermore, it is not anticipated that the patient will be medically stable for discharge from the hospital within 2 midnights of admission.   * I certify that at the point of admission it is my clinical judgment that the patient will require inpatient hospital care spanning beyond 2 midnights from the point of admission due to high intensity of service, high risk for further deterioration and high frequency of surveillance required.*   ------- I spent 55 minutes reviewing previous labs/notes, obtaining separate history at the bedside, counseling/discussing the treatment plan outlined above, ordering medications/tests, and performing clinical documentation.  Author: Marsha Ada, MD 09/12/2023 7:17 AM  For on call review [email or web address].

## 2023-09-12 NOTE — Consult Note (Addendum)
 Cardiology Consultation   Patient ID: William Elliott MRN: [id number]; DOB: [birth year]  Admit date: 09/12/2023 Date of Consult: 09/12/2023  PCP:  [name], MD   [facility] Providers Cardiologist:  Jayson Sierras, MD  Cardiology APP:  Miriam Norris, NP       Patient Profile: William Elliott is a 83 y.o. 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  History of Present Illness: William Elliott follows with [facility] and last seen on 06/17/23 by Norris Miriam NP, he was doing well and asymptomatic.  His cardiac history includes CAD s/p CABG in 1999 and DES to OM in 2017. With NSTEMI in 2022, with PCI s/p DES to SVG to OM, noted chronic occlusion of SVG to PDA and SVG to diagonal and patent LIMA to mid to distal LAD. He is statin intolerant has declined PCSK9i.   Recently diagnosed with HFmrEF.  Echocardiogram 01/18/23 showed LVEF 45% with severe asymmetric LVH of the lateral segment. Evidence of right ventricular pressure and volume overload. Mildly reduced RV systolic function with mildly enlarged RV. Severely dilated LA and RA. Moderate MAC and mild MR. Mild AS. He was continued on his metoprolol  and daily lasix , 20 mg daily most recently.  Last heart monitor 10/2022 showed permanent atrial fibrillation avg HR 41 bpm with frequent PVCs (21.6 %) and no pauses. Patient has not been on anticoagulation since November 2024 when he was hospitalized with GI bleed requiring multiple blood transfusions. He was referred to GI outpatient for endoscopy/colonoscopy in December, however he deferred.   Presented to the ED 09/12/2023 for chest pain with radiation to jaw and neck with bright red stool for 10 days. Patient had ASA 325 mg, 4  nitroglycerin  and fentanyl  prior to ED presentation. Hgb 6.7, Cr. 1.55 Troponin 134-> 1437 ECG AF bigeminy, VR 100 bpm Poor R wave progression global ST changes [ST depression I, avL, V2, V4, V5, V6 and ST elevation III and aVR] similar when compared to ECG 01/17/23 when patient presented with previous GI bleed CXR showed cardiomegaly with mild bilateral pulmonary edema and small pleural effusions consistent with CHF. Patient currently receiving blood transfusion. Not on heparin  given active bleed.  On interview patient shared he had an additional episode of chest pain on Friday, took 2 nitroglycerin  with positive effect. This morning he was up watching TV when he started having chest pain took 3 nitroglycerin  without relief. Called EMS received chest pain relief with morphine  in the ED. Remains chest pain free.   Patient shared he had been having diarrhea and bright red bloody stools for the past 10 days, did not tell his family. Bleeding stopped yesterday.  Had a chronic productive cough about 2-3 weeks ago, went to urgent care and diagnosed with bronchitis, given z-pac and prednisone. Patient reported improvement in cough. Reports orthopnea and PND. Per patient and son, peripheral edema prior to presentation to ED, unsure how long. Patient had been taking lasix  20 mg daily, he reduced his dose from 40 mg because he was worried about side effects.  He has random occasional palpitations. When asked about the endoscopy/colonoscopy that he deferred in Dec. He stated that it is God's will and that he did not want to  go. Son felt differently about his father's view.   Past Medical History:  Diagnosis Date   Acute lower GI bleeding 2008   Anxiety    Asthma    Carotid artery disease (HCC)    Asymptomatic left carotid bruit   COPD (chronic obstructive pulmonary disease) (HCC)    Coronary artery disease    a. CABG - 1999 with LIMA-LAD, SVG-DIAG, SVG-OM, SVG-RPDA  b. Cath in setting of NSTEMI 03/18/2015:  patent LIMA-LAD, occluded SVG-RCA, occluded SVG-D1, 99% stenosis of SVG-OM treated w/ DES   Depression    Dyslipidemia    Erectile dysfunction    Essential hypertension    History of blood transfusion 2008   History of stroke    Hyperlipidemia    Myocardial infarction [facility])     Past Surgical History:  Procedure Laterality Date   CARDIAC CATHETERIZATION     CARDIAC CATHETERIZATION N/A 03/18/2015   Procedure: Left Heart Cath and Cors/Grafts Angiography;  Surgeon: Alm LELON Clay, MD;  Location: [facility] INVASIVE CV LAB;  Service: Cardiovascular;  Laterality: N/A;   CARDIAC CATHETERIZATION N/A 03/18/2015   Procedure: Coronary Stent Intervention;  Surgeon: Alm LELON Clay, MD;  Location: [facility] INVASIVE CV LAB;  Service: Cardiovascular;  Laterality: N/A;   CATARACT EXTRACTION W/ INTRAOCULAR LENS  IMPLANT, BILATERAL Bilateral    CORONARY ANGIOPLASTY     CORONARY ARTERY BYPASS GRAFT  1999   CORONARY STENT INTERVENTION N/A 04/22/2020   Procedure: CORONARY STENT INTERVENTION;  Surgeon: Claudene Victory LELON, MD;  Location: [facility] INVASIVE CV LAB;  Service: Cardiovascular;  Laterality: N/A;   FRACTURE SURGERY     HERNIA REPAIR     LEFT HEART CATH AND CORS/GRAFTS ANGIOGRAPHY N/A 04/22/2020   Procedure: LEFT HEART CATH AND CORS/GRAFTS ANGIOGRAPHY;  Surgeon: Claudene Victory LELON, MD;  Location: [facility] INVASIVE CV LAB;  Service: Cardiovascular;  Laterality: N/A;   TIBIA FRACTURE SURGERY [facility]; took bone out of my right hip   TONSILLECTOMY     UMBILICAL HERNIA REPAIR  ~ 2000       Scheduled Meds:  sodium chloride    Intravenous Once   arformoterol   15 mcg Nebulization BID   And   umeclidinium bromide   1 puff Inhalation Daily   metoprolol  tartrate  12.5 mg Oral BID   pantoprazole  (PROTONIX ) IV  40 mg Intravenous Q12H   Continuous Infusions:  PRN Meds: ipratropium-albuterol   Allergies:    Allergies  Allergen Reactions   Anoro Ellipta  [Umeclidinium-Vilanterol] Other (See Comments)    Cardiac  problems   Lipitor [Atorvastatin Calcium] Other (See Comments)    Not known    Niaspan [Niacin] Other (See Comments)    Not known   Zocor [Simvastatin] Other (See Comments)    Not known   Fenofibrate Other (See Comments)    No energy    Social History:   Social History   Socioeconomic History   Marital status: Married    Spouse name: Not on file   Number of children: Not on file   Years of education: Not on file   Highest education level: Not on file  Occupational History   Occupation: Welder x 10 yrs   Tobacco Use   Smoking status: Former    Types: Cigars    Quit date: 03/02/1997    Years since quitting: 26.5    Passive exposure: Never   Smokeless tobacco: Never   Tobacco comments:    smoked cigars x 10 yrs, never smoked cigarettes  Vaping Use  Vaping status: Never Used  Substance and Sexual Activity   Alcohol use: No    Alcohol/week: 0.0 standard drinks of alcohol    Comment: drank some when I was young; quit in the 1990s   Drug use: No   Sexual activity: Not Currently  Other Topics Concern   Not on file  Social History Narrative   Not on file   Social Drivers of Health   Financial Resource Strain: Not on file  Food Insecurity: No Food Insecurity (01/17/2023)   Hunger Vital Sign    Worried About Running Out of Food in the Last Year: Never true    Ran Out of Food in the Last Year: Never true  Transportation Needs: No Transportation Needs (01/17/2023)   PRAPARE - Administrator, Civil Service (Medical): No    Lack of Transportation (Non-Medical): No  Physical Activity: Not on file  Stress: Not on file  Social Connections: Not on file  Intimate Partner Violence: Not At Risk (01/17/2023)   Humiliation, Afraid, Rape, and Kick questionnaire    Fear of Current or Ex-Partner: No    Emotionally Abused: No    Physically Abused: No    Sexually Abused: No    Family History:    Family History  Problem Relation Age of Onset   Heart disease Father     Heart attack Father    Hypertension Mother    Stroke Mother      ROS:  Please see the history of present illness.  All other ROS reviewed and negative.     Physical Exam/Data: Vitals:   09/12/23 0530 09/12/23 0600 09/12/23 0633 09/12/23 0655  BP: 132/70 135/71 120/71 127/70  Pulse: 100 95 99 96  Resp: 19 20 19 19   Temp:   98.2 F (36.8 C) 98.1 F (36.7 C)  TempSrc:   Oral Oral  SpO2: 98% 97% 96% 99%  Weight:      Height:       No intake or output data in the 24 hours ending 09/12/23 0734    09/12/2023    4:01 AM 06/17/2023    3:13 PM 03/25/2023    3:43 PM  Last 3 Weights  Weight (lbs) 171 lb 181 lb 9.6 oz 188 lb  Weight (kg) 77.565 kg 82.373 kg 85.276 kg     Body mass index is 24.54 kg/m.  General:  Chronically ill, elderly sitting up in stretcher in no acute distress HEENT: normal Neck: no JVD Vascular:Radial pulses 1+ bilaterally Cardiac:  Tachycardiac rate, irregular rhythm; no murmur  Lungs:  Bilateral wheezing & rhonchi Abd: Distended but compressible, nontender, hyperactive bowel sounds  Ext: 4+ pitting edema bilaterally to lower legs, 2+ dependent pitting edema to bilateral thighs Musculoskeletal:  No deformities Skin: warm and dry  Neuro:  CNs 2-12 intact, no focal abnormalities noted Psych:  Normal affect   EKG:  The EKG was personally reviewed and demonstrates:  See HPI. Telemetry:  Telemetry was personally reviewed and demonstrates:  AF with frequent PVCs [HR ~95]  Relevant CV Studies: Echocardiogram 01/18/23 IMPRESSIONS     1. Left ventricular ejection fraction, by estimation, is 45%. The left  ventricle has mildly decreased function. Left ventricular endocardial  border not optimally defined to evaluate regional wall motion. There is  severe asymmetric left ventricular  hypertrophy of the lateral segment. Left ventricular diastolic function  could not be evaluated. There is the interventricular septum is flattened  in systole and diastole,  consistent with right ventricular  pressure and  volume overload.   2. Right ventricular systolic function is mildly reduced. The right  ventricular size is mildly enlarged. There is moderately elevated  pulmonary artery systolic pressure. The estimated right ventricular  systolic pressure is 48.4 mmHg.   3. Left atrial size was severely dilated.   4. Right atrial size was severely dilated.   5. The mitral valve is abnormal. Mild mitral valve regurgitation. No  evidence of mitral stenosis. Moderate mitral annular calcification.   6. The aortic valve is tricuspid. Aortic valve regurgitation is not  visualized. Mild aortic valve stenosis. Aortic valve area, by VTI measures  1.55 cm. Aortic valve mean gradient measures 8.0 mmHg. Aortic valve Vmax  measures 2.11 m/s.   7. The inferior vena cava is dilated in size with <50% respiratory  variability, suggesting right atrial pressure of 15 mmHg.   Comparison(s): Prior images reviewed side by side. Changes from prior  study are noted. LVEF worsened from normal to 45% now.   Heart monitor 10/09/22 ZIO monitor reviewed.  6 days, 3 hours analyzed.   Predominant rhythm is atrial fibrillation with heart rate ranging from 30 bpm up to 75 bpm and average heart rate 41 bpm.  In the right clinical context, could be consistent with chronotropic incompetence and symptomatic bradycardia. There were frequent PVCs representing 21.6% total beats.  Otherwise rare ventricular couplets and triplets were noted as well as limited episodes of ventricular bigeminy and trigeminy. No pauses  LHC 04/22/20  A stent was successfully placed.  99% mid body stenosis in SVG to obtuse marginal reduced to 10% with TIMI grade III flow using a 4.0 x 22 Onyx deployed at 12 atm.  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 (The previously placed ostial to proximal saphenous vein to OM stent overhangs into the ascending aorta preventing engagement with  extra-support guide catheters.  An internal mammary guide catheter was used to coaxially aim and advanced the guidewire into the graft). Chronic occlusion of SVG to PDA. Chronic occlusion of SVG to diagonal. Patent LIMA to the mid to distal LAD.  Supplies collaterals to the PDA. Total occlusion of the mid LAD.  95% ostial LAD stenosis with only septal perforator and a small diagonal at risk. Total occlusion of the mid circumflex Total occlusion of the mid RCA.  PDA is supplied by LAD collaterals when the LIMA graft to the LAD is visualized. Echocardiogram documents preserved LV systolic function, EF greater than 50%.  LVEDP is normal, 7 mmHg.   Laboratory Data: High Sensitivity Troponin:   Recent Labs  Lab 09/12/23 0407  TROPONINIHS 134*     Chemistry Recent Labs  Lab 09/12/23 0407  NA 138  K 4.4  CL 106  CO2 20*  GLUCOSE 170*  BUN 48*  CREATININE 1.55*  CALCIUM 9.0  GFRNONAA 45*  ANIONGAP 12    No results for input(s): PROT, ALBUMIN, AST, ALT, ALKPHOS, BILITOT in the last 168 hours. Lipids No results for input(s): CHOL, TRIG, HDL, LABVLDL, LDLCALC, CHOLHDL in the last 168 hours.  Hematology Recent Labs  Lab 09/12/23 0407  WBC 25.2*  RBC 2.41*  HGB 6.7*  HCT 21.6*  MCV 89.6  MCH 27.8  MCHC 31.0  RDW 15.1  PLT 390   Thyroid  No results for input(s): TSH, FREET4 in the last 168 hours.  BNPNo results for input(s): BNP, PROBNP in the last 168 hours.  DDimer No results for input(s): DDIMER in the last  168 hours.  Radiology/Studies:  DG Chest Port 1 View Result Date: 09/12/2023 EXAM: 1 VIEW XRAY OF THE CHEST 09/12/2023 04:27:42 AM COMPARISON: 1 view chest x-ray dated 09/19/2022. CLINICAL HISTORY: CP. FINDINGS: LUNGS AND PLEURA: Mild edema is present bilaterally. Small effusions are suspected. HEART AND MEDIASTINUM: The heart is enlarged. Atherosclerotic calcifications are present at the aortic arch. BONES AND SOFT TISSUES: No acute  osseous abnormality. IMPRESSION: 1. Cardiomegaly with mild bilateral pulmonary edema and suspected small effusions, consistent with congestive heart failure . 2. Atherosclerosis. Electronically signed by: Lonni Necessary MD 09/12/2023 04:44 AM EDT RP Workstation: [id number]     Assessment and Plan: NSTEMI Type II CAD s/p CABG s/p PCI with DES to SVG to OM in 2017 & 2022, CTO to SVG to PDA and SVG to Diag Acute Blood Loss Anemia Presented with similar symptoms Nov 2024. Patient was to have endoscopy/colonoscopy in Dec, he deferred.  Chest pain this morning with radiation to left arm and jaw. Did not respond to nitroglycerin , did respond to morphine .  He has had 10 days of bright red stools. Hgb 6.7, has currently received 1 unit of PRBCs.  HDS.  His ECG shows signs of global ischemia. Hs Troponin 134 -[id number] Patient is currently chest pain free.   High suspicion for demand ischemia in a patient with elevated troponin and ischemic ECG changes in a patient with known severe coronary artery disease 2/2 of acute blood loss anemia. Patient is currently chest pain free. Unfortunately unable to start heparin  due to current blood loss anemia requiring transfusions.   -hold anticoagulation and aspirin  in the setting of blood loss anemia -continue to transfuse with hgb goal > 8 as patient has known severe coronary artery disease.  -patient is statin intolerance and has deferred PCSK9i  -continue metoprolol  tartrate 12.5 mg BID (see below)  Acute on chronic systolic heart failure  Patient had recent respiratory illness. He has noticed orthopnea, PND, and peripheral edema prior to presentation.  Currently receiving blood products.  Cxr showed signs of pulmonary edema and pleural effusions.  On exam he is volume overloaded. Will check BNP.  Cr. 1.55 most likely from volume overload and anemia. He has been trending up since Nov 2024. Echocardiogram pending Strict I/O  -Start IV lasix  60 mg daily,  will need to monitor patient with ongoing transfusions as he may need higher dose of lasix   -continue metoprolol  tartrate 12.5 mg BID for now, will need to discontinue if he becomes decompensated   Preoperative Risk Stratification His RCRI score is 3, he has a perioperative risk of a major cardiac event of 11 %.  No ischemic evaluation planned at this time. Unable to heparinize.  Will obtain echocardiogram to assess structure and function to guide IV diuresis plans and for risk stratification. We will continue to optimize with diuresis.  The patient is high risk for a low risk procedure of endoscopy/colonoscopy. If this level of risk is acceptable to the patient and surgical team, patient may proceed from a cardiovascular standpoint.  Pending MD review.   Permanent Atrial Fibrillation Bradycardia Frequent PVCs Heart monitor 10/2022 showed permanent AF with avg HR 41 bpm. He had a PVC burden of 21.6 %. Patient had a recent GI bleed Nov 2024, anticoagulation was stopped. Would recommend to not restart as patient is currently severely anemic from current GI bleed.  [place] Vasc Score 7 -continue metoprolol  tartrate 12.5 mg BID (see above)  Hypertension BP: 120/75 Currently normotensive without medication management. Starting IV diuresis,  will need to monitor BP with current blood loss anemia. May hold BB if needed for diuresis.  10. Disposition Would recommend goals of care conversation with patient's family present as they appear to having some differing views.  Per primary COPD H/o CVA   Risk Assessment/Risk Scores:    TIMI Risk Score for Unstable Angina or Non-ST Elevation MI:   The patient's TIMI risk score is 6, which indicates a 41% risk of all cause mortality, new or recurrent myocardial infarction or need for urgent revascularization in the next 14 days.  New York  Heart Association (NYHA) Functional Class NYHA Class II  CHA2DS2-VASc Score = 7   This indicates a 11.2% annual  risk of stroke. The patient's score is based upon: CHF History: 1 HTN History: 1 Diabetes History: 0 Stroke History: 2 Vascular Disease History: 1 Age Score: 2 Gender Score: 0        For questions or updates, please contact [facility] Please consult [email or web address] for contact info under    Signed, Leontine LOISE Garrick DEVONNA  09/12/2023 7:34 AM  Patient seen and examined with Leontine Garrick PA-C.  Agree as above, with the following exceptions and changes as noted below.  83 year old male seen with his son at the bedside who presents with 10 days of bright red blood per rectum and chest pain, with acute blood loss anemia and likely demand ischemia.  Echocardiogram is pending. Gen: NAD, appears pale, CV: RRR, no murmurs, Lungs: clear, Abd: soft, Extrem: Cool, 3+ pitting edema, Neuro/Psych: alert and oriented x 3, normal mood and affect. All available labs, radiology testing, previous records reviewed.  As I was seeing the patient GI was present as well, timing of potential endoscopy/colonoscopy is to be determined based on necessity.  Troponin elevation likely secondary to demand ischemia, type II NSTEMI.  Cannot use heparin  or aspirin  at this time, supportive care for acute blood loss anemia from GI bleeding.  Patient on metoprolol  at home with no signs of low output heart failure, reasonable to continue to mitigate heart rate in setting of permanent atrial fibrillation.  If patient appears to have low urine output or signs of progressing low output heart failure, stop metoprolol .  Accordingly, with acute anemia patient is not on anticoagulation at this time for atrial fibrillation.  Recommend transfusion goal of greater than 8.  Agree with preoperative restratification as above, patient is overall high risk for general anesthesia, but permissible risk for moderate sedation or MAC, if endoscopy is felt necessary to investigate GI bleeding.  Diurese with Lasix  IV 60 mg today, monitor  response and creatinine, with bleeding we will gently diurese today and can perform more aggressive diuresis tomorrow pending hemodynamics and renal function.  William Anzaldo A Benaiah Behan, MD 09/12/23 12:51 PM

## 2023-09-12 NOTE — ED Triage Notes (Addendum)
 Pt BIB [facility] from home. Pt endorses chest pressure since around 2300 yesterday that radiates to his jaw and neck. Pt reports he has also been having bright red stools x10 days. Prior to EMS arrival he took 324 ASA, 3 nitroglycerin . EMS gave 1 nitroglycerin  and 100 mcg fentanyl . Hx CABGx3, MI  20LAC

## 2023-09-12 NOTE — ED Notes (Signed)
Moved patient to hospital bed.

## 2023-09-13 ENCOUNTER — Inpatient Hospital Stay (HOSPITAL_COMMUNITY)

## 2023-09-13 DIAGNOSIS — I5023 Acute on chronic systolic (congestive) heart failure: Secondary | ICD-10-CM

## 2023-09-13 DIAGNOSIS — N179 Acute kidney failure, unspecified: Secondary | ICD-10-CM

## 2023-09-13 DIAGNOSIS — I2489 Other forms of acute ischemic heart disease: Secondary | ICD-10-CM | POA: Diagnosis not present

## 2023-09-13 DIAGNOSIS — D649 Anemia, unspecified: Secondary | ICD-10-CM | POA: Diagnosis not present

## 2023-09-13 DIAGNOSIS — R079 Chest pain, unspecified: Secondary | ICD-10-CM

## 2023-09-13 DIAGNOSIS — D62 Acute posthemorrhagic anemia: Secondary | ICD-10-CM

## 2023-09-13 DIAGNOSIS — I214 Non-ST elevation (NSTEMI) myocardial infarction: Secondary | ICD-10-CM

## 2023-09-13 DIAGNOSIS — K922 Gastrointestinal hemorrhage, unspecified: Secondary | ICD-10-CM | POA: Diagnosis not present

## 2023-09-13 LAB — HEMOGLOBIN AND HEMATOCRIT, BLOOD
HCT: 23.9 % — ABNORMAL LOW (ref 39.0–52.0)
HCT: 24.2 % — ABNORMAL LOW (ref 39.0–52.0)
Hemoglobin: 7.7 g/dL — ABNORMAL LOW (ref 13.0–17.0)
Hemoglobin: 7.9 g/dL — ABNORMAL LOW (ref 13.0–17.0)

## 2023-09-13 LAB — ECHOCARDIOGRAM COMPLETE
AR max vel: 2.39 cm2
AV Area VTI: 2.76 cm2
AV Area mean vel: 2.8 cm2
AV Mean grad: 5 mmHg
AV Peak grad: 13.4 mmHg
Ao pk vel: 1.83 m/s
Area-P 1/2: 3.2 cm2
Calc EF: 38 %
Height: 70 in
S' Lateral: 4.2 cm
Single Plane A2C EF: 37.2 %
Single Plane A4C EF: 39.7 %
Weight: 2736 [oz_av]

## 2023-09-13 LAB — BASIC METABOLIC PANEL WITH GFR
Anion gap: 9 (ref 5–15)
BUN: 43 mg/dL — ABNORMAL HIGH (ref 8–23)
CO2: 23 mmol/L (ref 22–32)
Calcium: 8.3 mg/dL — ABNORMAL LOW (ref 8.9–10.3)
Chloride: 102 mmol/L (ref 98–111)
Creatinine, Ser: 1.44 mg/dL — ABNORMAL HIGH (ref 0.61–1.24)
GFR, Estimated: 49 mL/min — ABNORMAL LOW (ref >60)
Glucose, Bld: 110 mg/dL — ABNORMAL HIGH (ref 70–99)
Potassium: 3.8 mmol/L (ref 3.5–5.1)
Sodium: 134 mmol/L — ABNORMAL LOW (ref 135–145)

## 2023-09-13 LAB — CBC
HCT: 20.9 % — ABNORMAL LOW (ref 39.0–52.0)
Hemoglobin: 6.5 g/dL — CL (ref 13.0–17.0)
MCH: 27.2 pg (ref 26.0–34.0)
MCHC: 31.1 g/dL (ref 30.0–36.0)
MCV: 87.4 fL (ref 80.0–100.0)
Platelets: 260 K/uL (ref 150–400)
RBC: 2.39 MIL/uL — ABNORMAL LOW (ref 4.22–5.81)
RDW: 15.4 % (ref 11.5–15.5)
WBC: 12.4 K/uL — ABNORMAL HIGH (ref 4.0–10.5)
nRBC: 0 % (ref 0.0–0.2)

## 2023-09-13 LAB — PREPARE RBC (CROSSMATCH)

## 2023-09-13 LAB — TROPONIN I (HIGH SENSITIVITY): Troponin I (High Sensitivity): 9138 ng/L (ref <18)

## 2023-09-13 MED ORDER — OMEGA-3-ACID ETHYL ESTERS 1 G PO CAPS
1.0000 g | ORAL_CAPSULE | Freq: Every day | ORAL | Status: DC
  Administered 2023-09-13 – 2023-09-16 (×4): 1 g via ORAL
  Filled 2023-09-13 (×4): qty 1

## 2023-09-13 MED ORDER — FUROSEMIDE 10 MG/ML IJ SOLN
40.0000 mg | Freq: Two times a day (BID) | INTRAMUSCULAR | Status: DC
  Administered 2023-09-13 – 2023-09-15 (×6): 40 mg via INTRAVENOUS
  Filled 2023-09-13 (×6): qty 4

## 2023-09-13 MED ORDER — ALPRAZOLAM 0.5 MG PO TABS
0.5000 mg | ORAL_TABLET | Freq: Three times a day (TID) | ORAL | Status: DC | PRN
  Administered 2023-09-13 – 2023-09-16 (×5): 0.5 mg via ORAL
  Filled 2023-09-13 (×5): qty 1

## 2023-09-13 MED ORDER — POTASSIUM CHLORIDE CRYS ER 20 MEQ PO TBCR
20.0000 meq | EXTENDED_RELEASE_TABLET | Freq: Once | ORAL | Status: AC
  Administered 2023-09-13: 20 meq via ORAL
  Filled 2023-09-13: qty 1

## 2023-09-13 MED ORDER — SODIUM CHLORIDE 0.9% IV SOLUTION: Freq: Once | INTRAVENOUS | Status: DC

## 2023-09-13 MED ORDER — ADULT MULTIVITAMIN W/MINERALS CH
1.0000 | ORAL_TABLET | Freq: Every day | ORAL | Status: DC
  Administered 2023-09-14 – 2023-09-16 (×3): 1 via ORAL
  Filled 2023-09-13 (×3): qty 1

## 2023-09-13 NOTE — ED Notes (Signed)
 PT'S HGB 6.5 ATTENDING GLORIETTA MD) NOTIFIED

## 2023-09-13 NOTE — ED Notes (Signed)
 Pt's condom cath came off per family. CC replaced

## 2023-09-13 NOTE — Progress Notes (Signed)
   09/13/23 1646  Spiritual Encounters  Type of Visit Declined chaplain visit  OnCall Visit No   Chaplain visited Pt, per family member no chaplain services  were not needed at this.

## 2023-09-13 NOTE — TOC CM/SW Note (Signed)
 Transition of Care [name]) - Inpatient Brief Assessment   Patient Details  Name: William Elliott MRN: [id number] Date of Birth: 09-Feb-1941  [name]) CM/SW Contact:    William Barnie Rama, RN Phone Number: 09/13/2023, 3:38 PM   Clinical Narrative: From home with son, has PCP William Elliott)  and insurance on file, states has no HH services in place at this time or DME at home.  States family member will transport them home at [employer] and family is support system, states gets medications from [employer].  Pta self ambulatory.  Patient gives this NCM permission to speak with son.   Transition of Care Asessment: Insurance and Status: Insurance coverage has been reviewed Patient has primary care physician: Yes Home environment has been reviewed: home with son Prior level of function:: ambulatory Prior/Current Home Services: No current home services Social Drivers of Health Review: SDOH reviewed no interventions necessary Readmission risk has been reviewed: Yes Transition of care needs: no transition of care needs at this time

## 2023-09-13 NOTE — Progress Notes (Signed)
  Echocardiogram 2D Echocardiogram has been performed.  William Elliott 09/13/2023, 4:10 PM

## 2023-09-13 NOTE — Progress Notes (Signed)
 Inpatient Progress Note     Patient Profile/Chief Complaint  83 year old male admitted chest pain and demand ischemia in the setting of hematochezia, maroon stool suspected to be related to diverticular bleeding, acute on chronic anemia.    Interval History   -- Denies having any bowel movements this morning -- No abdominal pain or cramping -- Hgb trend 6.7 --> 7.2 --> 6.5    Objective   Vital signs in last 24 hours: Temp:  [97.8 F (36.6 C)-98.3 F (36.8 C)] 98 F (36.7 C) (07/14 1226) Pulse Rate:  [38-90] 73 (07/14 1027) Resp:  [14-26] 18 (07/14 1226) BP: (98-144)/(53-104) 119/78 (07/14 1226) SpO2:  [77 %-100 %] 99 % (07/14 1226) Last BM Date : 09/12/23 General:    Elderly gentleman resting quietly in bed Heart:  Regular rate and rhythm; no murmurs Lungs: Respirations even and unlabored, lungs CTA bilaterally Abdomen:  Soft, nontender and nondistended. Normal bowel sounds. Extremities:  Without edema. Neurologic:  Alert and oriented,  grossly normal neurologically. Psych:  Cooperative. Normal mood and affect.  Intake/Output from previous day: 07/13 0701 - 07/14 0700 In: -  Out: 1200 [Urine:1200] Intake/Output this shift: Total I/O In: 542.5 [Blood:542.5] Out: 750 [Urine:750]  Lab Results: Recent Labs    09/12/23 0407 09/12/23 1505 09/13/23 0301  WBC 25.2*  --  12.4*  HGB 6.7* 7.2* 6.5*  HCT 21.6* 22.8* 20.9*  PLT 390  --  260   BMET Recent Labs    09/12/23 0407 09/13/23 0301  NA 138 134*  K 4.4 3.8  CL 106 102  CO2 20* 23  GLUCOSE 170* 110*  BUN 48* 43*  CREATININE 1.55* 1.44*  CALCIUM 9.0 8.3*   LFT No results for input(s): PROT, ALBUMIN, AST, ALT, ALKPHOS, BILITOT, BILIDIR, IBILI in the last 72 hours. PT/INR No results for input(s): LABPROT, INR in the last 72 hours.  Studies/Results: DG Chest Port 1 View Result Date: 09/12/2023 EXAM: 1 VIEW XRAY OF THE CHEST 09/12/2023 04:27:42 AM COMPARISON: 1 view chest x-ray  dated 09/19/2022. CLINICAL HISTORY: CP. FINDINGS: LUNGS AND PLEURA: Mild edema is present bilaterally. Small effusions are suspected. HEART AND MEDIASTINUM: The heart is enlarged. Atherosclerotic calcifications are present at the aortic arch. BONES AND SOFT TISSUES: No acute osseous abnormality. IMPRESSION: 1. Cardiomegaly with mild bilateral pulmonary edema and suspected small effusions, consistent with congestive heart failure . 2. Atherosclerosis. Electronically signed by: Lonni Necessary MD 09/12/2023 04:44 AM EDT RP Workstation: [id number]    Endoscopic Studies: None this admission   Clinical Impression   84 year old gentleman with history of  CAD status post CABG and DES in 2017, atrial fibrillation, hypertension, CVA, 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  Based upon clinical presentation of stuttering, painless hematochezia and maroon stool as well as prior history of diverticular bleeding, the etiology of current bleeding is most likely diverticular.  Upper GI bleeding less likely given the patient's clinical stability.  I concur with the clinical recommendation made by my colleagues over the weekend to pursue conservative management as diverticular bleeding will typically cease on its own.  Colonoscopy is of little utility in the setting of diverticular bleeding from an interventional perspective and there is risk in the setting of patient's recent demand ischemia.   Plan  Monitor for signs of overt GI bleeding Monitor serial hemoglobin and hematocrit and transfuse  as appropriate to maintain hemoglobin > = 8 If there is evidence of ongoing bleeding may consider tagged RBC scan to try to localize a source of bleeding.  In the setting of AKI would avoid contrast with CTA. No plan for endoscopic evaluation/intervention  at this time.  Reviewed with patient and his son that it would be appropriate for him to have follow-up endoscopic evaluation in the outpatient setting once he is stable from a hemoglobin and cardiac standpoint. Continue Protonix  40 mg IV twice daily for now.   LOS: 1 day   William Elliott  09/13/2023, 1:12 PM  William Hausen, MD [facility]

## 2023-09-13 NOTE — ED Notes (Signed)
 MESSAGED THE ATTENDING AGAIN FOR HGB 6.5, WAITING ON ORDERS

## 2023-09-13 NOTE — ED Notes (Signed)
 MESSAGED THE ONCOMING ATTENDING REGARDING PT'S HGB 6.25

## 2023-09-13 NOTE — Progress Notes (Signed)
 Progress Note   Patient: William Elliott FMW:[id number] DOB: 08/23/1940 DOA: 09/12/2023     1 DOS: the patient was seen and examined on 09/13/2023   Brief hospital course: William Elliott is a 83 y.o. male with medical history significant of PAF (not on OAC due to history of GI bleed), CAD s/p 4vCABG, HTN, HLD, COPD, bilateral carotid artery stenosis, history of CVA, history of GI bleeding, who was last admitted on 01/17/2023 with chest pain/NSTEMI in the setting of GIB p/w recurrent NSTEMI and GIB. He is admitted to [facility] service with Cardiology and GI evaluation. Patient got 1 unit PRBC 09/12/23.   Assessment and Plan: Type II MI In the setting of severe anemia. Denies chest discomfort or shortness of breath. Troponin trended up to 1437. Trend troponin. Did receive total 2 units PRBC. No heparin / eliquis due to active bleeding, drop in h/h. Cardiology evaluation and follow up appreciated. Echo pending.  Acute GI bleed. Acute blood loss anemia S/p 1unit PRBC transfusion Hb today 6.5 Another unit of PRBC ordered. He has occasional BRBPR. GI evaluation appreciated. Continue to monitor hemoglobin. Transfuse for Hb less than 7. Continue Protonix  40mg  IV daily, If active bleeding GI advised tagged RBC.  Acute on chronic systolic CHF- Echo pending. Continue IV diuresis with 40mg  IV lasix  bid. Continue lopressor  12.5 bid. Monitor daily weight, renal function, electrolytes, strict I/O. Cardiology follow up.  AKI- In the setting of heart failure. Kidney function worsening since 2 -3months. Continue to monitor renal function daily on IV diuresis. Monitor electrolytes.  COPD- No exacerbation. Continue home inhalers.  CAD s/p CABG- Continue statin. Hold Aspirin  in the setting of GI bleed.  Paroxysmal Afib- Not on Eliquis due to GI bleed. Continue metoprolol  12.5 bid. Continue telemetry.      Out of bed to chair. Incentive spirometry. Nursing supportive care. Fall, aspiration  precautions. Diet:  Diet Orders (From admission, onward)     Start     Ordered   09/12/23 0715  Diet clear liquid Room service appropriate? Yes; Fluid consistency: Thin  Diet effective now       Question Answer Comment  Room service appropriate? Yes   Fluid consistency: Thin      09/12/23 0714           DVT prophylaxis: SCDs Start: 09/12/23 0714  Level of care: Telemetry Cardiac   Code Status: Full Code  Subjective: Patient is seen and examined today morning. He is lying in bed. Has no active bleeding. Son at bedside. Denies chest pain. Did not get out of bed.  Physical Exam: Vitals:   09/13/23 0929 09/13/23 1014 09/13/23 1027 09/13/23 1226  BP: (!) 118/58 121/77  119/78  Pulse: 65 (!) 56 73   Resp: 16 16  18   Temp: 97.8 F (36.6 C) 98.1 F (36.7 C)  98 F (36.7 C)  TempSrc:  Oral  Oral  SpO2:  94%  99%  Weight:      Height:        General - Elderly Caucasian male, no apparent distress HEENT - PERRLA, EOMI, atraumatic head, non tender sinuses. Lung - Clear, basal rales, rhonchi, no wheezes. Heart - S1, S2 heard, no murmurs, rubs, 1+ pedal edema. Abdomen - Soft, non tender, bowel sounds good Neuro - Alert, awake and oriented x 3, non focal exam. Skin - Warm and dry.  Data Reviewed:      Latest Ref Rng & Units 09/13/2023    1:58 PM 09/13/2023  3:01 AM 09/12/2023    3:05 PM  CBC  WBC 4.0 - 10.5 K/uL  12.4    Hemoglobin 13.0 - 17.0 g/dL 7.7  6.5  7.2   Hematocrit 39.0 - 52.0 % 23.9  20.9  22.8   Platelets 150 - 400 K/uL  260        Latest Ref Rng & Units 09/13/2023    3:01 AM 09/12/2023    4:07 AM 04/09/2023   11:06 AM  BMP  Glucose 70 - 99 mg/dL 889  829  88   BUN 8 - 23 mg/dL 43  48  28   Creatinine 0.61 - 1.24 mg/dL 8.55  8.44  8.62   BUN/Creat Ratio 10 - 24   20   Sodium 135 - 145 mmol/L 134  138  140   Potassium 3.5 - 5.1 mmol/L 3.8  4.4  4.3   Chloride 98 - 111 mmol/L 102  106  101   CO2 22 - 32 mmol/L 23  20  24    Calcium 8.9 - 10.3 mg/dL 8.3   9.0  9.3    DG Chest Port 1 View Result Date: 09/12/2023 EXAM: 1 VIEW XRAY OF THE CHEST 09/12/2023 04:27:42 AM COMPARISON: 1 view chest x-ray dated 09/19/2022. CLINICAL HISTORY: CP. FINDINGS: LUNGS AND PLEURA: Mild edema is present bilaterally. Small effusions are suspected. HEART AND MEDIASTINUM: The heart is enlarged. Atherosclerotic calcifications are present at the aortic arch. BONES AND SOFT TISSUES: No acute osseous abnormality. IMPRESSION: 1. Cardiomegaly with mild bilateral pulmonary edema and suspected small effusions, consistent with congestive heart failure . 2. Atherosclerosis. Electronically signed by: Lonni Necessary MD 09/12/2023 04:44 AM EDT RP Workstation: [id number]    Family Communication: Discussed with patient, son at bedside, They understand and agree. All questions answered.  Disposition: Status is: Inpatient Remains inpatient appropriate because: PRBC transfusion, IV lasix , Hb monitoring  Planned Discharge Destination: Home with Home Health     Time spent: 42 minutes  Author: Concepcion Riser, MD 09/13/2023 2:51 PM Secure chat 7am to 7pm For on call review [email or web address].

## 2023-09-13 NOTE — Progress Notes (Signed)
 Rounding Note   Patient Name: William Elliott Date of Encounter: 09/13/2023  [facility] Cardiologist: Jayson Sierras, MD   Subjective Denies any chest pain.  BP 113/60.  Incomplete I/os.  Creatinine improved (1.55 > 1.44).  Hemoglobin down to 6.5 this morning  Scheduled Meds:  sodium chloride    Intravenous Once   arformoterol   15 mcg Nebulization BID   And   umeclidinium bromide   1 puff Inhalation Daily   metoprolol  tartrate  12.5 mg Oral BID   pantoprazole  (PROTONIX ) IV  40 mg Intravenous Q12H   Continuous Infusions:  PRN Meds: ipratropium-albuterol    Vital Signs  Vitals:   09/13/23 0056 09/13/23 0200 09/13/23 0600 09/13/23 0721  BP:  114/62 113/60   Pulse:  65 65   Resp:  18 20   Temp: 98.3 F (36.8 C)   97.8 F (36.6 C)  TempSrc:    Oral  SpO2:  98% 92%   Weight:      Height:        Intake/Output Summary (Last 24 hours) at 09/13/2023 0826 Last data filed at 09/12/2023 1904 Gross per 24 hour  Intake --  Output 1200 ml  Net -1200 ml      09/12/2023    4:01 AM 06/17/2023    3:13 PM 03/25/2023    3:43 PM  Last 3 Weights  Weight (lbs) 171 lb 181 lb 9.6 oz 188 lb  Weight (kg) 77.565 kg 82.373 kg 85.276 kg      Telemetry Rate controlled A-fib with PVCs- Personally Reviewed  ECG  No new ECG- Personally Reviewed  Physical Exam  GEN: No acute distress.   Neck: + JVD Cardiac: Irregular, normal rate no murmur Respiratory: Diminished breath sounds GI: Soft, nontender, non-distended  MS: 2+ lower extremity edema;  Neuro:  Nonfocal  Psych: Normal affect   Labs High Sensitivity Troponin:   Recent Labs  Lab 09/12/23 0407 09/12/23 0626  TROPONINIHS 134* 1,437*     Chemistry Recent Labs  Lab 09/12/23 0407 09/13/23 0301  NA 138 134*  K 4.4 3.8  CL 106 102  CO2 20* 23  GLUCOSE 170* 110*  BUN 48* 43*  CREATININE 1.55* 1.44*  CALCIUM 9.0 8.3*  GFRNONAA 45* 49*  ANIONGAP 12 9    Lipids No results for input(s): CHOL, TRIG, HDL,  LABVLDL, LDLCALC, CHOLHDL in the last 168 hours.  Hematology Recent Labs  Lab 09/12/23 0407 09/12/23 1505 09/13/23 0301  WBC 25.2*  --  12.4*  RBC 2.41*  --  2.39*  HGB 6.7* 7.2* 6.5*  HCT 21.6* 22.8* 20.9*  MCV 89.6  --  87.4  MCH 27.8  --  27.2  MCHC 31.0  --  31.1  RDW 15.1  --  15.4  PLT 390  --  260   Thyroid  No results for input(s): TSH, FREET4 in the last 168 hours.  BNP Recent Labs  Lab 09/12/23 1031  BNP 351.8*    DDimer No results for input(s): DDIMER in the last 168 hours.   Radiology  DG Chest Port 1 View Result Date: 09/12/2023 EXAM: 1 VIEW XRAY OF THE CHEST 09/12/2023 04:27:42 AM COMPARISON: 1 view chest x-ray dated 09/19/2022. CLINICAL HISTORY: CP. FINDINGS: LUNGS AND PLEURA: Mild edema is present bilaterally. Small effusions are suspected. HEART AND MEDIASTINUM: The heart is enlarged. Atherosclerotic calcifications are present at the aortic arch. BONES AND SOFT TISSUES: No acute osseous abnormality. IMPRESSION: 1. Cardiomegaly with mild bilateral pulmonary edema and suspected small effusions, consistent with  congestive heart failure . 2. Atherosclerosis. Electronically signed by: Lonni Necessary MD 09/12/2023 04:44 AM EDT RP Workstation: [id number]    Cardiac Studies   Patient Profile   83 y.o. male  with a hx of CAD s/p CABG in 1999 and DES to OM in 2017 & 2022, HFmrEF, permanent A-fib not on AOC, bradycardia, mixed hyperlipidemia, h/o lower GI bleed, dizziness, hypertension, and h/o CVA who is being seen 09/12/2023 for the evaluation of elevated troponin   Assessment & Plan  NSTEMI Type II CAD s/p CABG s/p PCI with DES to SVG to OM in 2017 & 2022, CTO to SVG to PDA and SVG to Diag Acute Blood Loss Anemia Presented with similar symptoms Nov 2024. Patient was to have endoscopy/colonoscopy in Dec, he deferred.  Chest pain this admission with radiation to left arm and jaw. Did not respond to nitroglycerin , did respond to morphine .  He has had 10  days of bright red stools. Hgb 6.7 on presentation, has currently received 1 unit of PRBCs.  HDS.  His ECG shows signs of global ischemia. Hs Troponin 134 -[id number] Patient is currently chest pain free.  Hgb down to 6.5 this morning   High suspicion for demand ischemia in a patient with elevated troponin and ischemic ECG changes in a patient with known severe coronary artery disease 2/2 of acute blood loss anemia. Patient is currently chest pain free. Unfortunately unable to start heparin  due to current blood loss anemia requiring transfusions.    -hold anticoagulation and aspirin  in the setting of blood loss anemia -continue to transfuse with hgb goal > 8 as patient has known severe coronary artery disease.  -patient is statin intolerance and has deferred [id number]  -continue metoprolol  tartrate 12.5 mg BID  -check echo   Acute on chronic systolic heart failure  Patient had recent respiratory illness. He has noticed orthopnea, PND, and peripheral edema prior to presentation.  Currently receiving blood products.  Cxr showed signs of pulmonary edema and pleural effusions.  On exam he is volume overloaded. BNP 352 Cr. 1.55 most likely from volume overload and anemia. He has been trending up since Nov 2024. Echocardiogram pending Strict I/O   - Appears significantly volume overloaded on exam, will diurese with IV Lasix  40 mg twice daily -continue metoprolol  tartrate 12.5 mg BID for now     Permanent Atrial Fibrillation Bradycardia Frequent PVCs Heart monitor 10/2022 showed permanent AF with avg HR 41 bpm. He had a PVC burden of 21.6 %. Patient had a recent GI bleed Nov 2024, anticoagulation was stopped. Would recommend to not restart as patient is currently severely anemic from current GI bleed.  [place] Vasc Score 7 -continue metoprolol  tartrate 12.5 mg BID   Hypertension BP: 120/75 Currently normotensive without medication management. Starting IV diuresis, will need to monitor BP with  current blood loss anemia. May hold BB if needed for diuresis.   Disposition Would recommend goals of care conversation with patient's family present as they appear to having some differing views.   Per primary COPD H/o CVA  For questions or updates, please contact [facility] Please consult [email or web address] for contact info under     Signed, [name], MD  09/13/2023, 8:26 AM

## 2023-09-14 ENCOUNTER — Telehealth (HOSPITAL_COMMUNITY): Payer: Self-pay

## 2023-09-14 ENCOUNTER — Other Ambulatory Visit (HOSPITAL_COMMUNITY): Payer: Self-pay

## 2023-09-14 DIAGNOSIS — D62 Acute posthemorrhagic anemia: Secondary | ICD-10-CM | POA: Diagnosis not present

## 2023-09-14 DIAGNOSIS — I5023 Acute on chronic systolic (congestive) heart failure: Secondary | ICD-10-CM | POA: Diagnosis not present

## 2023-09-14 DIAGNOSIS — D649 Anemia, unspecified: Secondary | ICD-10-CM | POA: Diagnosis not present

## 2023-09-14 DIAGNOSIS — I2489 Other forms of acute ischemic heart disease: Secondary | ICD-10-CM | POA: Diagnosis not present

## 2023-09-14 DIAGNOSIS — I482 Chronic atrial fibrillation, unspecified: Secondary | ICD-10-CM | POA: Diagnosis not present

## 2023-09-14 DIAGNOSIS — I214 Non-ST elevation (NSTEMI) myocardial infarction: Secondary | ICD-10-CM | POA: Diagnosis not present

## 2023-09-14 DIAGNOSIS — K922 Gastrointestinal hemorrhage, unspecified: Secondary | ICD-10-CM | POA: Diagnosis not present

## 2023-09-14 LAB — BASIC METABOLIC PANEL WITH GFR
Anion gap: 12 (ref 5–15)
BUN: 32 mg/dL — ABNORMAL HIGH (ref 8–23)
CO2: 25 mmol/L (ref 22–32)
Calcium: 8.1 mg/dL — ABNORMAL LOW (ref 8.9–10.3)
Chloride: 97 mmol/L — ABNORMAL LOW (ref 98–111)
Creatinine, Ser: 1.15 mg/dL (ref 0.61–1.24)
GFR, Estimated: >60 mL/min (ref >60)
Glucose, Bld: 89 mg/dL (ref 70–99)
Potassium: 2.9 mmol/L — ABNORMAL LOW (ref 3.5–5.1)
Sodium: 134 mmol/L — ABNORMAL LOW (ref 135–145)

## 2023-09-14 LAB — CBC
HCT: 22.3 % — ABNORMAL LOW (ref 39.0–52.0)
Hemoglobin: 7.3 g/dL — ABNORMAL LOW (ref 13.0–17.0)
MCH: 28 pg (ref 26.0–34.0)
MCHC: 32.7 g/dL (ref 30.0–36.0)
MCV: 85.4 fL (ref 80.0–100.0)
Platelets: 223 K/uL (ref 150–400)
RBC: 2.61 MIL/uL — ABNORMAL LOW (ref 4.22–5.81)
RDW: 15 % (ref 11.5–15.5)
WBC: 12.6 K/uL — ABNORMAL HIGH (ref 4.0–10.5)
nRBC: 0 % (ref 0.0–0.2)

## 2023-09-14 LAB — TROPONIN I (HIGH SENSITIVITY): Troponin I (High Sensitivity): 9510 ng/L (ref <18)

## 2023-09-14 LAB — PREPARE RBC (CROSSMATCH)

## 2023-09-14 LAB — MAGNESIUM: Magnesium: 1.7 mg/dL (ref 1.7–2.4)

## 2023-09-14 MED ORDER — POTASSIUM CHLORIDE CRYS ER 20 MEQ PO TBCR
40.0000 meq | EXTENDED_RELEASE_TABLET | Freq: Two times a day (BID) | ORAL | Status: DC
  Administered 2023-09-14 – 2023-09-16 (×5): 40 meq via ORAL
  Filled 2023-09-14 (×5): qty 2

## 2023-09-14 MED ORDER — SODIUM CHLORIDE 0.9% IV SOLUTION: Freq: Once | INTRAVENOUS | Status: AC

## 2023-09-14 MED ORDER — DIPHENHYDRAMINE HCL 25 MG PO CAPS
25.0000 mg | ORAL_CAPSULE | Freq: Once | ORAL | Status: DC | PRN
  Filled 2023-09-14: qty 1

## 2023-09-14 MED ORDER — MAGNESIUM SULFATE 2 GM/50ML IV SOLN
2.0000 g | Freq: Once | INTRAVENOUS | Status: AC
  Administered 2023-09-14: 2 g via INTRAVENOUS
  Filled 2023-09-14: qty 50

## 2023-09-14 NOTE — Plan of Care (Signed)

## 2023-09-14 NOTE — Progress Notes (Signed)
 Heart Failure Nurse Navigator Progress Note  PCP: [name], MD PCP-Cardiologist: Debera Admission Diagnosis: Anemia, nonspecific chest pain.  Admitted from: Home via EMS  Presentation:   William Elliott presented with chest pressure that radiates to his jaw and neck, been having bright red stools for 10 days. BP 135/71, HR 95, BNP 351, troponin 1437, Hgb 6.7 has received multiple units of PRBC. CXR showed bilateral pulmonary edema and pleural effusions consistent with congestive heart failure. Per GI there are no plans this admission for EGD or colonoscopy, however will plan for outpatient.   Patient was educated on the sign and symptoms of heart failure , daily weights, when to call his doctor or go to the ED. Diet/ fluid restrictions , patient reported to using salt in his cooking, but doesn't add extra to his food, and he usually will drink 1-2 pepsi's per week. Continued education on taking all medications as prescribed and attending all medical appointments. Patient verbalized his understanding of all education, a HF TOC appointment was scheduled for 09/21/2023 @ 3:30 pm.   ECHO/ LVEF: 35-40%  Clinical Course:  Past Medical History:  Diagnosis Date   Acute lower GI bleeding 2008   Anxiety    Asthma    Carotid artery disease (HCC)    Asymptomatic left carotid bruit   COPD (chronic obstructive pulmonary disease) (HCC)    Coronary artery disease    a. CABG - 1999 with LIMA-LAD, SVG-DIAG, SVG-OM, SVG-RPDA  b. Cath in setting of NSTEMI 03/18/2015: patent LIMA-LAD, occluded SVG-RCA, occluded SVG-D1, 99% stenosis of SVG-OM treated w/ DES   Depression    Dyslipidemia    Erectile dysfunction    Essential hypertension    History of blood transfusion 2008   History of stroke    Hyperlipidemia    Myocardial infarction [facility])      Social History   Socioeconomic History   Marital status: Married    Spouse name: Not on file   Number of children: Not on file   Years of education:  Not on file   Highest education level: Not on file  Occupational History   Occupation: Welder x 10 yrs   Tobacco Use   Smoking status: Former    Types: Cigars    Quit date: 03/02/1997    Years since quitting: 26.5    Passive exposure: Never   Smokeless tobacco: Never   Tobacco comments:    smoked cigars x 10 yrs, never smoked cigarettes  Vaping Use   Vaping status: Never Used  Substance and Sexual Activity   Alcohol use: No    Alcohol/week: 0.0 standard drinks of alcohol    Comment: drank some when I was young; quit in the 1990s   Drug use: No   Sexual activity: Not Currently  Other Topics Concern   Not on file  Social History Narrative   Not on file   Social Drivers of Health   Financial Resource Strain: Not on file  Food Insecurity: No Food Insecurity (09/13/2023)   Hunger Vital Sign    Worried About Running Out of Food in the Last Year: Never true    Ran Out of Food in the Last Year: Never true  Transportation Needs: No Transportation Needs (09/13/2023)   PRAPARE - Administrator, Civil Service (Medical): No    Lack of Transportation (Non-Medical): No  Physical Activity: Not on file  Stress: Not on file  Social Connections: Socially Integrated (09/13/2023)   Social Connection and  Isolation Panel    Frequency of Communication with Friends and Family: More than three times a week    Frequency of Social Gatherings with Friends and Family: More than three times a week    Attends Religious Services: More than 4 times per year    Active Member of [employer] or Organizations: Yes    Attends Engineer, structural: More than 4 times per year    Marital Status: Married   Water engineer and Provision:  Detailed education and instructions provided on heart failure disease management including the following:  Signs and symptoms of Heart Failure When to call the physician Importance of daily weights Low sodium diet Fluid restriction Medication  management Anticipated future follow-up appointments  Patient education given on each of the above topics.  Patient acknowledges understanding via teach back method and acceptance of all instructions.  Education Materials:  Living Better With Heart Failure Booklet, HF zone tool, & Daily Weight Tracker Tool.  Patient has scale at home: Yes Patient has pill box at home: Yes    High Risk Criteria for Readmission and/or Poor Patient Outcomes: Heart failure hospital admissions (last 6 months): 0  No Show rate: 3 % Difficult social situation: No, lives with his wife Demonstrates medication adherence: Yes Primary Language: English Literacy level: Reading, writing, and comprehension.   Barriers of Care:   Diet/ fluid restrictions ( uses salt with his cooking and drinks 1-2 pepsi per week) Daily weights  Considerations/Referrals:   Referral made to Heart Failure Pharmacist Stewardship: Yes Referral made to Heart Failure CSW/NCM TOC: NA Referral made to Heart & Vascular TOC clinic: Yes, 09/21/2023 @ 3:30 pm.   Items for Follow-up on DC/TOC: Continued HF education Diet/ fluid restrictions Daily weights.    Stephane Haddock, BSN, Scientist, clinical (histocompatibility and immunogenetics) Only

## 2023-09-14 NOTE — Progress Notes (Signed)
   Heart Failure Stewardship Pharmacist Progress Note   PCP: [name], MD PCP-Cardiologist: Jayson Sierras, MD    HPI:  83 yo M with PMH of afib, CAD s/p CABG, HTN, HLD, COPD, bilateral carotid artery stenosis, CVA, and GI bleeding.   Presented to the ED on 7/13 with chest pressure radiating to jaw and neck and blood in stool. Labs notable for Cr 1.55, BNP 351.8, troponin 134>1437, WBC 25.2 and Hb 6.7 s/p multiple transfusions. CXR showed bilateral pulmonary edema and pleural effusions consistent with congestive heart failure. ECHO 7/14 with LVEF 35-40% (last 45% in 01/2023), global hypokinesis, RV mildly reduced, moderately elevated PA pressure, mild to moderate MR. GI and cardiology have been consulted. No plans for colonoscopy or EGD this admission per GI - will plan to have done as outpatient.   Met with patient at bedside. Denies shortness of breath today. 2+ LE edema on exam. States he felt his medications were working well for him before admission. He states he was taking lasix  20 mg daily instead of 40 mg daily because he was worried about side effects. He is agreeable to using [facility] pharmacy on discharge. He recently moved and switched his pharmacy from [facility] to [facility] - he states they provide delivery of his medications and he normally uses a pill box at home.    Current HF Medications: Diuretic: furosemide  40 mg IV BID Beta Blocker: metoprolol  tartrate 12.5 mg BID  Prior to admission HF Medications: Diuretic: furosemide  20 mg daily  Pertinent Lab Values: Serum creatinine 1.15, BUN 32, Potassium 2.9, Sodium 134, BNP 351.8, Magnesium  1.7   Vital Signs: Weight: 171 lbs (admission weight: 171 lbs) Blood pressure: 110/50s  Heart rate: 50-60s  I/O: net -0.2L yesterday; net -1.4L since admission  Medication Assistance / Insurance Benefits Check: Does the patient have prescription insurance?  Yes Type of insurance plan: UHC Medicare + [place] Medicaid  Outpatient  Pharmacy:  Prior to admission outpatient pharmacy: [facility] Is the patient willing to use [facility] TOC pharmacy at discharge? Yes Is the patient willing to transition their outpatient pharmacy to utilize a [facility] outpatient pharmacy?   No    Assessment: 1. Acute on chronic systolic CHF (LVEF 35-40%). NYHA class III symptoms. - Continue furosemide  40 mg IV BID. Strict I/Os and daily weights. Keep K>4 and Mg>2. KCl 40 mEq BID ordered for replacement.  - On metoprolol  tartrate 12.5 mg BID - with new HFrEF, would consolidate to metoprolol  XL 25 mg daily once restarted - now holding with HR 50s.  - ARB/ARNI and MRA pending improvement in BP - Consider starting SGLT2i prior to discharge   Plan: 1) Medication changes recommended at this time: - Switch to metoprolol  XL 25 mg daily once BB resumed  2) Patient assistance: - Jardiance /Farxiga copay $0  3)  Education  - Initial education complete - To be completed prior to discharge  Duwaine Plant, PharmD, BCPS Heart Failure Engineer, building services Phone [phone]

## 2023-09-14 NOTE — Telephone Encounter (Signed)
 Patient Advocate Encounter  Test billing for this patients current coverage ([employer]) returns copay of $0 for 90 day supply of either Farxiga or Jardiance .  Rachel DEL, CPhT Rx Patient Advocate Phone: [phone]

## 2023-09-14 NOTE — Progress Notes (Signed)
 [facility] night cross cover note:   Per patient's request for Benadryl  for sleep, I have ordered Benadryl  25 mg po x 1 dose prn for insomnia.      Eva Pore, DO Hospitalist

## 2023-09-14 NOTE — Progress Notes (Signed)
 Progress Note   Patient: William Elliott FMW:[id number] DOB: 04/14/1940 DOA: 09/12/2023     2 DOS: the patient was seen and examined on 09/14/2023   Brief hospital course: William Elliott is a 83 y.o. male with medical history significant of PAF (not on OAC due to history of GI bleed), CAD s/p 4vCABG, HTN, HLD, COPD, bilateral carotid artery stenosis, history of CVA, history of GI bleeding, who was last admitted on 01/17/2023 with chest pain/NSTEMI in the setting of GIB p/w recurrent NSTEMI and GIB. He is admitted to [facility] service with Cardiology and GI evaluation. Patient got 1 unit PRBC 09/12/23.   Assessment and Plan: Type II MI In the setting of severe blood loss anemia. Denies chest discomfort or shortness of breath. Troponin trended up to 9500. Did receive total 2 units PRBC. Another unit order 7/15 for Hb greater than 8. No heparin / eliquis or aspirin  due to active bleeding, drop in h/h. Cardiology follow up appreciated. Echo showed EF 35-40%, global hypokinesis, mildly reduced RV function, PASP 50.5 mmHg.  Continue IV Lasix  40 mg twice daily.  Acute GI bleed. Acute blood loss anemia S/p 2units PRBC transfusion Hb today 7.3 Another unit of PRBC ordered to maintain Hb ~8.  GI follow up appreciated. Continue to monitor hemoglobin.  Continue Protonix  40mg  IV daily, If active bleeding GI advised tagged RBC or consider CTA as kidney function improving. Outpatient EGD once stable cardiac wise.  Acute on chronic systolic CHF- Echo showed EF 35 to 40%, global hypokinesis, mild reduced RV function. Continue IV diuresis with 40mg  IV lasix  bid.  Net negative fluid balance 1.4 L. Continue lopressor  12.5 bid. Monitor daily weight, renal function, electrolytes, strict I/O. Cardiology follow up.  Acute kidney injury- In the setting of heart failure. Kidney function improved with IV diuresis. Continue to monitor renal function daily on IV diuresis. Monitor electrolytes.  COPD- No  exacerbation. Continue home inhalers.  CAD s/p CABG- Continue statin. Hold Aspirin  in the setting of GI bleed.  Paroxysmal Afib- Continue metoprolol  12.5 bid.  Not on anticoagulation due to GI bleed. Continue telemetry.      Out of bed to chair. Incentive spirometry. Nursing supportive care. Fall, aspiration precautions. Diet:  Diet Orders (From admission, onward)     Start     Ordered   09/12/23 0715  Diet clear liquid Room service appropriate? Yes; Fluid consistency: Thin  Diet effective now       Question Answer Comment  Room service appropriate? Yes   Fluid consistency: Thin      09/12/23 0714           DVT prophylaxis: Place and maintain sequential compression device Start: 09/14/23 1530 SCDs Start: 09/12/23 0714  Level of care: Telemetry Cardiac   Code Status: Full Code  Subjective: Patient is seen and examined today morning. He is lying in bed.  Feels better, getting out of bed. Denies chest pain, shortness of breath  Physical Exam: Vitals:   09/14/23 0827 09/14/23 1015 09/14/23 1030 09/14/23 1350  BP:  (!) 101/42 (!) 113/52 (!) 110/59  Pulse: 62 (!) 54 (!) 56 (!) 54  Resp:  18 16 15   Temp:  98.2 F (36.8 C) 98.2 F (36.8 C) 98.2 F (36.8 C)  TempSrc:  Oral  Oral  SpO2:      Weight:      Height:        General - Elderly Caucasian male, no apparent distress HEENT - PERRLA, EOMI, atraumatic head, non  tender sinuses. Lung - Clear, basal rales, rhonchi, no wheezes. Heart - S1, S2 heard, no murmurs, rubs, 1+ pedal edema. Abdomen - Soft, non tender, bowel sounds good Neuro - Alert, awake and oriented x 3, non focal exam. Skin - Warm and dry.  Varicose veins noted on right lower extremity.  Data Reviewed:      Latest Ref Rng & Units 09/14/2023    2:53 AM 09/13/2023    8:40 PM 09/13/2023    1:58 PM  CBC  WBC 4.0 - 10.5 K/uL 12.6     Hemoglobin 13.0 - 17.0 g/dL 7.3  7.9  7.7   Hematocrit 39.0 - 52.0 % 22.3  24.2  23.9   Platelets 150 - 400 K/uL  223         Latest Ref Rng & Units 09/14/2023    2:53 AM 09/13/2023    3:01 AM 09/12/2023    4:07 AM  BMP  Glucose 70 - 99 mg/dL 89  889  829   BUN 8 - 23 mg/dL 32  43  48   Creatinine 0.61 - 1.24 mg/dL 8.84  8.55  8.44   Sodium 135 - 145 mmol/L 134  134  138   Potassium 3.5 - 5.1 mmol/L 2.9  3.8  4.4   Chloride 98 - 111 mmol/L 97  102  106   CO2 22 - 32 mmol/L 25  23  20    Calcium 8.9 - 10.3 mg/dL 8.1  8.3  9.0    ECHOCARDIOGRAM COMPLETE Result Date: 09/13/2023    ECHOCARDIOGRAM REPORT   Patient Name:   William Elliott Date of Exam: 09/13/2023 Medical Rec #:  [id number]       Height:       70.0 in Accession #:    [id number]      Weight:       171.0 lb Date of Birth:  05-03-1940      BSA:          1.953 m Patient Age:    82 years        BP:           119/78 mmHg Patient Gender: M               HR:           50 bpm. Exam Location:  Inpatient Procedure: 2D Echo, Cardiac Doppler and Color Doppler (Both Spectral and Color            Flow Doppler were utilized during procedure). Indications:    Chest pain, unspecified  History:        Patient has prior history of Echocardiogram examinations, most                 recent 01/18/2023. CHF, Previous Myocardial Infarction and CAD,                 Abnormal ECG, COPD, Arrythmias:Atrial Fibrillation,                 Signs/Symptoms:Dyspnea and Shortness of Breath; Risk                 Factors:Hypertension and Dyslipidemia.  Sonographer:    Ellouise Mose RDCS Referring Phys: [id number] LOGAN N LOCKWOOD IMPRESSIONS  1. Left ventricular ejection fraction, by estimation, is 35 to 40%. The left ventricle has moderately decreased function. The left ventricle demonstrates global hypokinesis. Left ventricular diastolic function could not be evaluated. There is the interventricular septum is flattened in  systole and diastole, consistent with right ventricular pressure and volume overload.  2. Right ventricular systolic function is mildly reduced. The right ventricular size is  moderately enlarged. There is moderately elevated pulmonary artery systolic pressure. The estimated right ventricular systolic pressure is 50.5 mmHg.  3. Left atrial size was moderately dilated.  4. Right atrial size was mildly dilated.  5. The mitral valve is degenerative. Mild to moderate mitral valve regurgitation.  6. The aortic valve is tricuspid. Aortic valve regurgitation is mild. Aortic valve sclerosis is present, with no evidence of aortic valve stenosis.  7. The inferior vena cava is dilated in size with <50% respiratory variability, suggesting right atrial pressure of 15 mmHg. Comparison(s): A prior study was performed on 01/18/2023. LVEF was reported to be 45% and now 35-40%. FINDINGS  Left Ventricle: Left ventricular ejection fraction, by estimation, is 35 to 40%. The left ventricle has moderately decreased function. The left ventricle demonstrates global hypokinesis. The left ventricular internal cavity size was normal in size. There is no left ventricular hypertrophy. The interventricular septum is flattened in systole and diastole, consistent with right ventricular pressure and volume overload. Left ventricular diastolic function could not be evaluated due to atrial fibrillation. Left ventricular diastolic function could not be evaluated. Right Ventricle: The right ventricular size is moderately enlarged. Right vetricular wall thickness was not well visualized. Right ventricular systolic function is mildly reduced. There is moderately elevated pulmonary artery systolic pressure. The tricuspid regurgitant velocity is 2.98 m/s, and with an assumed right atrial pressure of 15 mmHg, the estimated right ventricular systolic pressure is 50.5 mmHg. Left Atrium: Left atrial size was moderately dilated. Right Atrium: Right atrial size was mildly dilated. Pericardium: There is no evidence of pericardial effusion. Mitral Valve: The mitral valve is degenerative in appearance. There is mild thickening of the  mitral valve leaflet(s). Normal mobility of the mitral valve leaflets. Mild mitral annular calcification. Mild to moderate mitral valve regurgitation. Tricuspid Valve: The tricuspid valve is normal in structure. Tricuspid valve regurgitation is mild . No evidence of tricuspid stenosis. Aortic Valve: The aortic valve is tricuspid. Aortic valve regurgitation is mild. Aortic valve sclerosis is present, with no evidence of aortic valve stenosis. Aortic valve mean gradient measures 5.0 mmHg. Aortic valve peak gradient measures 13.4 mmHg. Aortic valve area, by VTI measures 2.76 cm. Pulmonic Valve: The pulmonic valve was normal in structure. Pulmonic valve regurgitation is mild to moderate. No evidence of pulmonic stenosis. Aorta: The aortic root and ascending aorta are structurally normal, with no evidence of dilitation. Venous: The inferior vena cava is dilated in size with less than 50% respiratory variability, suggesting right atrial pressure of 15 mmHg. IAS/Shunts: The atrial septum is grossly normal.  LEFT VENTRICLE PLAX 2D LVIDd:         5.50 cm LVIDs:         4.20 cm LV PW:         1.20 cm LV IVS:        1.20 cm LVOT diam:     2.20 cm LV SV:         90 LV SV Index:   46 LVOT Area:     3.80 cm  LV Volumes (MOD) LV vol d, MOD A2C: 180.0 ml LV vol d, MOD A4C: 159.0 ml LV vol s, MOD A2C: 113.0 ml LV vol s, MOD A4C: 95.8 ml LV SV MOD A2C:     67.0 ml LV SV MOD A4C:     159.0 ml  LV SV MOD BP:      65.8 ml RIGHT VENTRICLE             IVC RV S prime:     10.30 cm/s  IVC diam: 2.70 cm TAPSE (M-mode): 1.2 cm LEFT ATRIUM             Index        RIGHT ATRIUM           Index LA diam:        5.80 cm 2.97 cm/m   RA Area:     23.40 cm LA Vol (A2C):   79.0 ml 40.45 ml/m  RA Volume:   73.50 ml  37.64 ml/m LA Vol (A4C):   99.8 ml 51.10 ml/m LA Biplane Vol: 90.1 ml 46.14 ml/m  AORTIC VALVE AV Area (Vmax):    2.39 cm AV Area (Vmean):   2.80 cm AV Area (VTI):     2.76 cm AV Vmax:           183.00 cm/s AV Vmean:           94.600 cm/s AV VTI:            0.327 m AV Peak Grad:      13.4 mmHg AV Mean Grad:      5.0 mmHg LVOT Vmax:         115.00 cm/s LVOT Vmean:        69.700 cm/s LVOT VTI:          0.237 m LVOT/AV VTI ratio: 0.72  AORTA Ao Root diam: 3.80 cm Ao Asc diam:  3.80 cm MITRAL VALVE                TRICUSPID VALVE MV Area (PHT): 3.20 cm     TR Peak grad:   35.5 mmHg MV Decel Time: 237 msec     TR Vmax:        298.00 cm/s MV E velocity: 127.00 cm/s                             SHUNTS                             Systemic VTI:  0.24 m                             Systemic Diam: 2.20 cm Sunit Tolia Electronically signed by Madonna Large Signature Date/Time: 09/13/2023/7:02:20 PM    Final     Family Communication: Discussed with patient, he understand and agree. All questions answered.  Disposition: Status is: Inpatient Remains inpatient appropriate because: PRBC transfusion, IV lasix , Hb monitoring  Planned Discharge Destination: Home with Home Health     Time spent: 40 minutes  Author: Concepcion Riser, MD 09/14/2023 4:06 PM Secure chat 7am to 7pm For on call review [email or web address].

## 2023-09-14 NOTE — Progress Notes (Signed)
 Inpatient Progress Note     Patient Profile/Chief Complaint  83 year old male admitted chest pain and demand ischemia in the setting of hematochezia, maroon stool suspected to be related to diverticular bleeding, acute on chronic anemia.    Interval History   -- Denies having any bowel movements over last 48 hours -- No abdominal pain or cramping -- Hgb trend: 7.7 --> 7.9 --> 7.3 -now receiving an additional unit of packed red blood cells    Objective   Vital signs in last 24 hours: Temp:  [98 F (36.7 C)-98.4 F (36.9 C)] 98.2 F (36.8 C) (07/15 1030) Pulse Rate:  [54-69] 56 (07/15 1030) Resp:  [16-18] 16 (07/15 1030) BP: (101-141)/(42-91) 113/52 (07/15 1030) SpO2:  [98 %-99 %] 98 % (07/15 0720) Last BM Date : 09/12/23 General:    Elderly gentleman resting quietly in bed Heart:  Regular rate and rhythm; no murmurs Lungs: Respirations even and unlabored, lungs CTA bilaterally Abdomen:  Soft, nontender and nondistended. Normal bowel sounds. Extremities:  Without edema. Neurologic:  Alert and oriented,  grossly normal neurologically. Psych:  Cooperative. Normal mood and affect.  Intake/Output from previous day: 07/14 0701 - 07/15 0700 In: 542.5 [Blood:542.5] Out: 750 [Urine:750] Intake/Output this shift: Total I/O In: 240 [P.O.:240] Out: -   Lab Results: Recent Labs    09/12/23 0407 09/12/23 1505 09/13/23 0301 09/13/23 1358 09/13/23 2040 09/14/23 0253  WBC 25.2*  --  12.4*  --   --  12.6*  HGB 6.7*   < > 6.5* 7.7* 7.9* 7.3*  HCT 21.6*   < > 20.9* 23.9* 24.2* 22.3*  PLT 390  --  260  --   --  223   < > = values in this interval not displayed.   BMET Recent Labs    09/12/23 0407 09/13/23 0301 09/14/23 0253  NA 138 134* 134*  K 4.4 3.8 2.9*  CL 106 102 97*  CO2 20* 23 25  GLUCOSE 170* 110* 89  BUN 48* 43* 32*  CREATININE 1.55* 1.44* 1.15  CALCIUM 9.0 8.3* 8.1*   LFT No results for input(s): PROT, ALBUMIN, AST, ALT, ALKPHOS, BILITOT,  BILIDIR, IBILI in the last 72 hours. PT/INR No results for input(s): LABPROT, INR in the last 72 hours.  Studies/Results: ECHOCARDIOGRAM COMPLETE Result Date: 09/13/2023    ECHOCARDIOGRAM REPORT   Patient Name:   TOMMIE BOHLKEN Date of Exam: 09/13/2023 Medical Rec #:  [id number]       Height:       70.0 in Accession #:    [id number]      Weight:       171.0 lb Date of Birth:  Nov 23, 1940      BSA:          1.953 m Patient Age:    82 years        BP:           119/78 mmHg Patient Gender: M               HR:           50 bpm. Exam Location:  Inpatient Procedure: 2D Echo, Cardiac Doppler and Color Doppler (Both Spectral and Color            Flow Doppler were utilized during procedure). Indications:    Chest pain, unspecified  History:        Patient has prior history of Echocardiogram examinations, most  recent 01/18/2023. CHF, Previous Myocardial Infarction and CAD,                 Abnormal ECG, COPD, Arrythmias:Atrial Fibrillation,                 Signs/Symptoms:Dyspnea and Shortness of Breath; Risk                 Factors:Hypertension and Dyslipidemia.  Sonographer:    Ellouise Mose RDCS Referring Phys: [id number] LOGAN N LOCKWOOD IMPRESSIONS  1. Left ventricular ejection fraction, by estimation, is 35 to 40%. The left ventricle has moderately decreased function. The left ventricle demonstrates global hypokinesis. Left ventricular diastolic function could not be evaluated. There is the interventricular septum is flattened in systole and diastole, consistent with right ventricular pressure and volume overload.  2. Right ventricular systolic function is mildly reduced. The right ventricular size is moderately enlarged. There is moderately elevated pulmonary artery systolic pressure. The estimated right ventricular systolic pressure is 50.5 mmHg.  3. Left atrial size was moderately dilated.  4. Right atrial size was mildly dilated.  5. The mitral valve is degenerative. Mild to moderate mitral valve  regurgitation.  6. The aortic valve is tricuspid. Aortic valve regurgitation is mild. Aortic valve sclerosis is present, with no evidence of aortic valve stenosis.  7. The inferior vena cava is dilated in size with <50% respiratory variability, suggesting right atrial pressure of 15 mmHg. Comparison(s): A prior study was performed on 01/18/2023. LVEF was reported to be 45% and now 35-40%. FINDINGS  Left Ventricle: Left ventricular ejection fraction, by estimation, is 35 to 40%. The left ventricle has moderately decreased function. The left ventricle demonstrates global hypokinesis. The left ventricular internal cavity size was normal in size. There is no left ventricular hypertrophy. The interventricular septum is flattened in systole and diastole, consistent with right ventricular pressure and volume overload. Left ventricular diastolic function could not be evaluated due to atrial fibrillation. Left ventricular diastolic function could not be evaluated. Right Ventricle: The right ventricular size is moderately enlarged. Right vetricular wall thickness was not well visualized. Right ventricular systolic function is mildly reduced. There is moderately elevated pulmonary artery systolic pressure. The tricuspid regurgitant velocity is 2.98 m/s, and with an assumed right atrial pressure of 15 mmHg, the estimated right ventricular systolic pressure is 50.5 mmHg. Left Atrium: Left atrial size was moderately dilated. Right Atrium: Right atrial size was mildly dilated. Pericardium: There is no evidence of pericardial effusion. Mitral Valve: The mitral valve is degenerative in appearance. There is mild thickening of the mitral valve leaflet(s). Normal mobility of the mitral valve leaflets. Mild mitral annular calcification. Mild to moderate mitral valve regurgitation. Tricuspid Valve: The tricuspid valve is normal in structure. Tricuspid valve regurgitation is mild . No evidence of tricuspid stenosis. Aortic Valve: The  aortic valve is tricuspid. Aortic valve regurgitation is mild. Aortic valve sclerosis is present, with no evidence of aortic valve stenosis. Aortic valve mean gradient measures 5.0 mmHg. Aortic valve peak gradient measures 13.4 mmHg. Aortic valve area, by VTI measures 2.76 cm. Pulmonic Valve: The pulmonic valve was normal in structure. Pulmonic valve regurgitation is mild to moderate. No evidence of pulmonic stenosis. Aorta: The aortic root and ascending aorta are structurally normal, with no evidence of dilitation. Venous: The inferior vena cava is dilated in size with less than 50% respiratory variability, suggesting right atrial pressure of 15 mmHg. IAS/Shunts: The atrial septum is grossly normal.  LEFT VENTRICLE PLAX 2D LVIDd:  5.50 cm LVIDs:         4.20 cm LV PW:         1.20 cm LV IVS:        1.20 cm LVOT diam:     2.20 cm LV SV:         90 LV SV Index:   46 LVOT Area:     3.80 cm  LV Volumes (MOD) LV vol d, MOD A2C: 180.0 ml LV vol d, MOD A4C: 159.0 ml LV vol s, MOD A2C: 113.0 ml LV vol s, MOD A4C: 95.8 ml LV SV MOD A2C:     67.0 ml LV SV MOD A4C:     159.0 ml LV SV MOD BP:      65.8 ml RIGHT VENTRICLE             IVC RV S prime:     10.30 cm/s  IVC diam: 2.70 cm TAPSE (M-mode): 1.2 cm LEFT ATRIUM             Index        RIGHT ATRIUM           Index LA diam:        5.80 cm 2.97 cm/m   RA Area:     23.40 cm LA Vol (A2C):   79.0 ml 40.45 ml/m  RA Volume:   73.50 ml  37.64 ml/m LA Vol (A4C):   99.8 ml 51.10 ml/m LA Biplane Vol: 90.1 ml 46.14 ml/m  AORTIC VALVE AV Area (Vmax):    2.39 cm AV Area (Vmean):   2.80 cm AV Area (VTI):     2.76 cm AV Vmax:           183.00 cm/s AV Vmean:          94.600 cm/s AV VTI:            0.327 m AV Peak Grad:      13.4 mmHg AV Mean Grad:      5.0 mmHg LVOT Vmax:         115.00 cm/s LVOT Vmean:        69.700 cm/s LVOT VTI:          0.237 m LVOT/AV VTI ratio: 0.72  AORTA Ao Root diam: 3.80 cm Ao Asc diam:  3.80 cm MITRAL VALVE                TRICUSPID VALVE MV  Area (PHT): 3.20 cm     TR Peak grad:   35.5 mmHg MV Decel Time: 237 msec     TR Vmax:        298.00 cm/s MV E velocity: 127.00 cm/s                             SHUNTS                             Systemic VTI:  0.24 m                             Systemic Diam: 2.20 cm Sunit Tolia Electronically signed by Madonna Large Signature Date/Time: 09/13/2023/7:02:20 PM    Final     Endoscopic Studies: None this admission   Clinical Impression   83 year old gentleman with history of  CAD status post CABG and DES in 2017, atrial  fibrillation, hypertension, CVA, 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  Based upon clinical presentation of stuttering, painless hematochezia and maroon stool as well as prior history of diverticular bleeding, the etiology of current bleeding is most likely diverticular.  Upper GI bleeding less likely given the patient's clinical stability.  I concur with the clinical recommendation made by my colleagues over the weekend to pursue conservative management as diverticular bleeding will typically cease on its own.  Colonoscopy is of little utility in the setting of diverticular bleeding from an interventional perspective and there is risk in the setting of patient's recent demand ischemia.  He denies having any bowel movements or overt bleeding over the last 48 hours.  Hemoglobin has fluctuated and decreased to 7.3 this morning for which he was receiving another unit of packed red blood cells.  Remains hemodynamically stable.  Denies chest pain.   Plan  Monitor for signs of overt GI bleeding Monitor serial hemoglobin and hematocrit and transfuse as appropriate to maintain hemoglobin > = 8 If there is evidence of ongoing bleeding may consider radiographic evaluation for localization of source of bleeding.  AKI is  improving so could potentially reconsider possibility of CTA if needed versus tagged red cell scan. No plan for endoscopic evaluation/intervention at this time.  Reviewed with patient and his son that it would be appropriate for him to have follow-up endoscopic evaluation in the outpatient setting once he is stable from a hemoglobin and cardiac standpoint. Continue Protonix  40 mg IV twice daily for now.   LOS: 2 days   Inocente CHRISTELLA Hausen  09/14/2023, 12:00 PM  Inocente Hausen, MD [facility]

## 2023-09-14 NOTE — Progress Notes (Signed)
 Mobility Specialist Progress Note:    09/14/23 1621  Mobility  Activity Ambulated with assistance in hallway  Level of Assistance Contact guard assist, steadying assist  Assistive Device None  Distance Ambulated (ft) 250 ft  Activity Response Tolerated well  Mobility Referral Yes  Mobility visit 1 Mobility  Mobility Specialist Start Time (ACUTE ONLY) 1532  Mobility Specialist Stop Time (ACUTE ONLY) 1542  Mobility Specialist Time Calculation (min) (ACUTE ONLY) 10 min   Received pt in bed and agreeable to mobility. No physical assistance needed. No c/o throughout. Returned to room without fault. Left pt in chair with personal belongings and call light within reach. All needs met.  Lavanda Pollack Mobility Specialist  Please contact via [employer] or  Rehab Office [phone]

## 2023-09-14 NOTE — Progress Notes (Addendum)
 Progress Note  Patient Name: William Elliott Date of Encounter: 09/14/2023 [facility] Cardiologist: William Sierras, MD   Interval Summary   Patient reports feeling overall good, no acute complaints Denies chest pain, shortness of breath, palpitations, dizziness, lightheadedness His main complaint is surrounding his blood draw from early this AM Preparing for another unit of RBC this AM  Vital Signs Vitals:   09/13/23 1939 09/13/23 2347 09/14/23 0720 09/14/23 0827  BP: (!) 141/91 (!) 116/58 (!) 113/52   Pulse: 69 62 (!) 57 62  Resp: 18 18 18    Temp: 98.4 F (36.9 C) 98.2 F (36.8 C) 98.2 F (36.8 C)   TempSrc: Oral Oral Oral   SpO2: 99% 98% 98%   Weight:      Height:        Intake/Output Summary (Last 24 hours) at 09/14/2023 0953 Last data filed at 09/13/2023 1226 Gross per 24 hour  Intake 542.5 ml  Output 750 ml  Net -207.5 ml      09/12/2023    4:01 AM 06/17/2023    3:13 PM 03/25/2023    3:43 PM  Last 3 Weights  Weight (lbs) 171 lb 181 lb 9.6 oz 188 lb  Weight (kg) 77.565 kg 82.373 kg 85.276 kg     Telemetry/ECG  A. Fib with HR 50-60s, PVCs - Personally Reviewed  Physical Exam  GEN: No acute distress.   Neck: mild JVD Cardiac: irregularly irregular, bradycardic, no murmurs, rubs, or gallops.  Respiratory: diminished breath sounds bilaterally. GI: Soft, nontender, non-distended  MS: 2+ bilateral LE edema  Assessment & Plan  83 y.o. 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  Acute blood loss anemia  DES to SVG-OM in 2017, s/p DES to SVG-OM in 2022, occlusion of PDA and SVG diagonal, patent LIMA-LAD  Presented with chest pain, radiation to left arm and jaw  Pain did not improve with NTG x 3, did improve with morphine    Patient reported bright red stools x 10 days  Hemoglobin 6.7 > 7.2 > 6.5 > 7.7 > 7.9 > 7.3 today Currently received 1 unit of pRBCs, preparing another unit today Troponin level 134 > 1,437 > 9,1738 > 9,510 Currently chest pain free  Suspect demand ischemia with known ABLA Unable to start heparin  with patient actively requiring blood transfusions  Continue to hold AC and ASA in setting of ABLA  Continue to transfuse with goal hemoglobin > 8, with severe CAD history  Currently on Lopressor  12.5 mg BID, would hold with bradycardia  Acute on chronic systolic heart failure  Reported orthopnea, PND, LE edema prior to admission Recently underwent respiratory illness CXR showed pulmonary edema, pleural effusions BNP 352 Creatinine improved, 1.15 today (from 1.44 yesterday) Net -1.4 L this admission, I&O's noted to be incomplete  Weight has not been checked since admission  Echo this admission: LVEF 35-40%, global hypokinesis, mildly reduced RV function, PASP 50.5 mmHg, BAE, mild to moderate MR, mild AR, dilated IVC Continue IV Lasix  40 mg BID  Currently on Lopressor  12.5 mg BID, will hold with bradycardia Continue strict I&O's, daily weights, daily BMPs  Permanent A. Fib  Bradycardia Frequent PVCs Cardiac monitor from Aug. 2024 showed permanent A. Fib with average HR  41, PVC burden 21.6% Recently had GI bleed Nov. 2024 where [facility] was stopped  Defer re-starting AC as patient is currently anemic with active GI bleed Hold Lopressor  with bradycardia  Hypertension  Most recent BP 113/52, HR 60s Currently receiving IV diuresis  Hold Lopressor  as above  Per primary  Acute GI bleed Acute blood loss anemia  AKI COPD  History of CVA  Electrolyte disturbances    For questions or updates, please contact [facility] Please consult [email or web address] for contact info under       Signed, William DELENA Donath, PA-C   Patient seen and examined.  Agree with above documentation.  On exam,  patient is alert and oriented, regular rate and rhythm, no murmurs, diminished breath sounds, + JVD, 1+ bilateral lower extremity edema.  BP 101/42.  Telemetry with A-fib with rates down to 50s.  Incomplete I/os.  Renal function improving with diuresis, has normalized.  Troponin up to 9510.  He is chest pain-free.  Hemoglobin down to 7.3, receiving another unit PRBC today.  Significant troponin elevation, but suspect likely demand ischemia in setting of anemia.  Suspect likely does have underlying obstructive CAD given extent of troponin elevation but not a candidate for heart catheterization in setting of his ongoing anemia.  Will plan medical management.  He is also volume overloaded on exam, would continue IV Lasix .  William LITTIE Nanas, MD

## 2023-09-14 NOTE — Progress Notes (Signed)
   09/14/23 1210  Spiritual Encounters  Type of Visit Follow up  Care provided to: Pt and family  Referral source Clinical staff  Reason for visit Advance directives  OnCall Visit No  Spiritual Framework  Presenting Themes Values and beliefs;Meaning/purpose/sources of inspiration  Community/Connection Family  Patient Stress Factors Health changes  Family Stress Factors Major life changes  Interventions  Spiritual Care Interventions Made Compassionate presence;Established relationship of care and support;Encouragement  Intervention Outcomes  Outcomes Awareness of health;Awareness of support;Reduced anxiety  Advance Directives (For Healthcare)  Does Patient Have a Medical Advance Directive? No  Would patient like information on creating a medical advance directive? No - Patient declined   Chaplain visited Pt.at bedside. The [name], Bless Belshe, was present during the visit. The family engaged in reflective conversation, sharing memories of life and playing football. Chaplain provided supportive presence and listened attentively.  Chaplain reviewed the Advance Directive (A.D) with the patient, The Pt. Stated his wife is designated to make healthcare decisions, follow by his son, Sparsh Callens [phone], if needed.At the family's request, the visit concluded with a word of prayer.

## 2023-09-15 DIAGNOSIS — D62 Acute posthemorrhagic anemia: Secondary | ICD-10-CM | POA: Diagnosis not present

## 2023-09-15 DIAGNOSIS — I482 Chronic atrial fibrillation, unspecified: Secondary | ICD-10-CM | POA: Diagnosis not present

## 2023-09-15 DIAGNOSIS — I2489 Other forms of acute ischemic heart disease: Secondary | ICD-10-CM | POA: Diagnosis not present

## 2023-09-15 DIAGNOSIS — K922 Gastrointestinal hemorrhage, unspecified: Secondary | ICD-10-CM | POA: Diagnosis not present

## 2023-09-15 DIAGNOSIS — I5023 Acute on chronic systolic (congestive) heart failure: Secondary | ICD-10-CM | POA: Diagnosis not present

## 2023-09-15 DIAGNOSIS — D649 Anemia, unspecified: Secondary | ICD-10-CM | POA: Diagnosis not present

## 2023-09-15 DIAGNOSIS — I214 Non-ST elevation (NSTEMI) myocardial infarction: Secondary | ICD-10-CM | POA: Diagnosis not present

## 2023-09-15 LAB — TYPE AND SCREEN
ABO/RH(D): O POS
Antibody Screen: NEGATIVE
Unit division: 0
Unit division: 0
Unit division: 0
Unit division: 0

## 2023-09-15 LAB — BPAM RBC
Blood Product Expiration Date: 202507172359
Blood Product Expiration Date: 202508182359
Blood Product Expiration Date: 202508102359
ISSUE DATE / TIME: 202507151006
ISSUE DATE / TIME: 202507130611
ISSUE DATE / TIME: 202507140856
ISSUE DATE / TIME: 202507141247
ISSUE DATE / TIME: 202508112359
Unit Type and Rh: 9500
Unit Type and Rh: 202507172359
Unit Type and Rh: 202508112359
Unit Type and Rh: 5100
Unit Type and Rh: 5100
Unit Type and Rh: 9500

## 2023-09-15 LAB — BASIC METABOLIC PANEL WITH GFR
Anion gap: 14 (ref 5–15)
BUN: 23 mg/dL (ref 8–23)
CO2: 25 mmol/L (ref 22–32)
Calcium: 8.8 mg/dL — ABNORMAL LOW (ref 8.9–10.3)
Chloride: 97 mmol/L — ABNORMAL LOW (ref 98–111)
Creatinine, Ser: 1.22 mg/dL (ref 0.61–1.24)
GFR, Estimated: 60 mL/min — ABNORMAL LOW (ref >60)
Glucose, Bld: 93 mg/dL (ref 70–99)
Potassium: 3.3 mmol/L — ABNORMAL LOW (ref 3.5–5.1)
Sodium: 136 mmol/L (ref 135–145)

## 2023-09-15 LAB — MAGNESIUM: Magnesium: 2.1 mg/dL (ref 1.7–2.4)

## 2023-09-15 LAB — CBC
HCT: 29.6 % — ABNORMAL LOW (ref 39.0–52.0)
Hemoglobin: 9.5 g/dL — ABNORMAL LOW (ref 13.0–17.0)
MCH: 27.6 pg (ref 26.0–34.0)
MCHC: 32.1 g/dL (ref 30.0–36.0)
MCV: 86 fL (ref 80.0–100.0)
Platelets: 273 K/uL (ref 150–400)
RBC: 3.44 MIL/uL — ABNORMAL LOW (ref 4.22–5.81)
RDW: 14.8 % (ref 11.5–15.5)
WBC: 11.5 K/uL — ABNORMAL HIGH (ref 4.0–10.5)
nRBC: 0 % (ref 0.0–0.2)

## 2023-09-15 MED ORDER — ALPRAZOLAM 0.5 MG PO TABS
0.5000 mg | ORAL_TABLET | Freq: Once | ORAL | Status: AC
  Administered 2023-09-15: 0.5 mg via ORAL
  Filled 2023-09-15: qty 1

## 2023-09-15 NOTE — Progress Notes (Signed)
 Inpatient Progress Note     Patient Profile/Chief Complaint  83 year old male admitted chest pain and demand ischemia in the setting of hematochezia, maroon stool suspected to be related to diverticular bleeding, acute on chronic anemia.    Interval History   -- Denies having any bowel movements over last 72 hours -- No abdominal pain or cramping -- Hgb 7.3 yesterday -received an additional unit of packed red blood cells -- Hgb stable at 9.5 this morning    Objective   Vital signs in last 24 hours: Temp:  [97.9 F (36.6 C)-98.7 F (37.1 C)] 98.6 F (37 C) (07/16 0900) Pulse Rate:  [52-68] 52 (07/16 0900) Resp:  [15-20] 20 (07/16 0900) BP: (101-142)/(42-85) 142/64 (07/16 0900) SpO2:  [94 %-99 %] 94 % (07/16 0900) Weight:  [73.3 kg] 73.3 kg (07/16 0347) Last BM Date : 09/12/23 General:    Elderly gentleman resting quietly in bed Heart:  Regular rate and rhythm; no murmurs Lungs: Respirations even and unlabored, lungs CTA bilaterally Abdomen:  Soft, nontender and nondistended. Normal bowel sounds. Extremities:  Without edema. Neurologic:  Alert and oriented,  grossly normal neurologically. Psych:  Cooperative. Normal mood and affect.  Intake/Output from previous day: 07/15 0701 - 07/16 0700 In: 1525 [P.O.:1160; Blood:315; IV Piggyback:50] Out: 1100 [Urine:1100] Intake/Output this shift: Total I/O In: 240 [P.O.:240] Out: -   Lab Results: Recent Labs    09/13/23 0301 09/13/23 1358 09/13/23 2040 09/14/23 0253 09/15/23 0357  WBC 12.4*  --   --  12.6* 11.5*  HGB 6.5*   < > 7.9* 7.3* 9.5*  HCT 20.9*   < > 24.2* 22.3* 29.6*  PLT 260  --   --  223 273   < > = values in this interval not displayed.   BMET Recent Labs    09/13/23 0301 09/14/23 0253 09/15/23 0357  NA 134* 134* 136  K 3.8 2.9* 3.3*  CL 102 97* 97*  CO2 23 25 25   GLUCOSE 110* 89 93  BUN 43* 32* 23  CREATININE 1.44* 1.15 1.22  CALCIUM 8.3* 8.1* 8.8*   LFT No results for input(s): PROT,  ALBUMIN, AST, ALT, ALKPHOS, BILITOT, BILIDIR, IBILI in the last 72 hours. PT/INR No results for input(s): LABPROT, INR in the last 72 hours.  Studies/Results: ECHOCARDIOGRAM COMPLETE Result Date: 09/13/2023    ECHOCARDIOGRAM REPORT   Patient Name:   KORI COLIN Date of Exam: 09/13/2023 Medical Rec #:  [id number]       Height:       70.0 in Accession #:    [id number]      Weight:       171.0 lb Date of Birth:  1940-08-16      BSA:          1.953 m Patient Age:    82 years        BP:           119/78 mmHg Patient Gender: M               HR:           50 bpm. Exam Location:  Inpatient Procedure: 2D Echo, Cardiac Doppler and Color Doppler (Both Spectral and Color            Flow Doppler were utilized during procedure). Indications:    Chest pain, unspecified  History:        Patient has prior history of Echocardiogram examinations, most  recent 01/18/2023. CHF, Previous Myocardial Infarction and CAD,                 Abnormal ECG, COPD, Arrythmias:Atrial Fibrillation,                 Signs/Symptoms:Dyspnea and Shortness of Breath; Risk                 Factors:Hypertension and Dyslipidemia.  Sonographer:    Ellouise Mose RDCS Referring Phys: [id number] LOGAN N LOCKWOOD IMPRESSIONS  1. Left ventricular ejection fraction, by estimation, is 35 to 40%. The left ventricle has moderately decreased function. The left ventricle demonstrates global hypokinesis. Left ventricular diastolic function could not be evaluated. There is the interventricular septum is flattened in systole and diastole, consistent with right ventricular pressure and volume overload.  2. Right ventricular systolic function is mildly reduced. The right ventricular size is moderately enlarged. There is moderately elevated pulmonary artery systolic pressure. The estimated right ventricular systolic pressure is 50.5 mmHg.  3. Left atrial size was moderately dilated.  4. Right atrial size was mildly dilated.  5. The mitral valve  is degenerative. Mild to moderate mitral valve regurgitation.  6. The aortic valve is tricuspid. Aortic valve regurgitation is mild. Aortic valve sclerosis is present, with no evidence of aortic valve stenosis.  7. The inferior vena cava is dilated in size with <50% respiratory variability, suggesting right atrial pressure of 15 mmHg. Comparison(s): A prior study was performed on 01/18/2023. LVEF was reported to be 45% and now 35-40%. FINDINGS  Left Ventricle: Left ventricular ejection fraction, by estimation, is 35 to 40%. The left ventricle has moderately decreased function. The left ventricle demonstrates global hypokinesis. The left ventricular internal cavity size was normal in size. There is no left ventricular hypertrophy. The interventricular septum is flattened in systole and diastole, consistent with right ventricular pressure and volume overload. Left ventricular diastolic function could not be evaluated due to atrial fibrillation. Left ventricular diastolic function could not be evaluated. Right Ventricle: The right ventricular size is moderately enlarged. Right vetricular wall thickness was not well visualized. Right ventricular systolic function is mildly reduced. There is moderately elevated pulmonary artery systolic pressure. The tricuspid regurgitant velocity is 2.98 m/s, and with an assumed right atrial pressure of 15 mmHg, the estimated right ventricular systolic pressure is 50.5 mmHg. Left Atrium: Left atrial size was moderately dilated. Right Atrium: Right atrial size was mildly dilated. Pericardium: There is no evidence of pericardial effusion. Mitral Valve: The mitral valve is degenerative in appearance. There is mild thickening of the mitral valve leaflet(s). Normal mobility of the mitral valve leaflets. Mild mitral annular calcification. Mild to moderate mitral valve regurgitation. Tricuspid Valve: The tricuspid valve is normal in structure. Tricuspid valve regurgitation is mild . No  evidence of tricuspid stenosis. Aortic Valve: The aortic valve is tricuspid. Aortic valve regurgitation is mild. Aortic valve sclerosis is present, with no evidence of aortic valve stenosis. Aortic valve mean gradient measures 5.0 mmHg. Aortic valve peak gradient measures 13.4 mmHg. Aortic valve area, by VTI measures 2.76 cm. Pulmonic Valve: The pulmonic valve was normal in structure. Pulmonic valve regurgitation is mild to moderate. No evidence of pulmonic stenosis. Aorta: The aortic root and ascending aorta are structurally normal, with no evidence of dilitation. Venous: The inferior vena cava is dilated in size with less than 50% respiratory variability, suggesting right atrial pressure of 15 mmHg. IAS/Shunts: The atrial septum is grossly normal.  LEFT VENTRICLE PLAX 2D LVIDd:  5.50 cm LVIDs:         4.20 cm LV PW:         1.20 cm LV IVS:        1.20 cm LVOT diam:     2.20 cm LV SV:         90 LV SV Index:   46 LVOT Area:     3.80 cm  LV Volumes (MOD) LV vol d, MOD A2C: 180.0 ml LV vol d, MOD A4C: 159.0 ml LV vol s, MOD A2C: 113.0 ml LV vol s, MOD A4C: 95.8 ml LV SV MOD A2C:     67.0 ml LV SV MOD A4C:     159.0 ml LV SV MOD BP:      65.8 ml RIGHT VENTRICLE             IVC RV S prime:     10.30 cm/s  IVC diam: 2.70 cm TAPSE (M-mode): 1.2 cm LEFT ATRIUM             Index        RIGHT ATRIUM           Index LA diam:        5.80 cm 2.97 cm/m   RA Area:     23.40 cm LA Vol (A2C):   79.0 ml 40.45 ml/m  RA Volume:   73.50 ml  37.64 ml/m LA Vol (A4C):   99.8 ml 51.10 ml/m LA Biplane Vol: 90.1 ml 46.14 ml/m  AORTIC VALVE AV Area (Vmax):    2.39 cm AV Area (Vmean):   2.80 cm AV Area (VTI):     2.76 cm AV Vmax:           183.00 cm/s AV Vmean:          94.600 cm/s AV VTI:            0.327 m AV Peak Grad:      13.4 mmHg AV Mean Grad:      5.0 mmHg LVOT Vmax:         115.00 cm/s LVOT Vmean:        69.700 cm/s LVOT VTI:          0.237 m LVOT/AV VTI ratio: 0.72  AORTA Ao Root diam: 3.80 cm Ao Asc diam:  3.80 cm  MITRAL VALVE                TRICUSPID VALVE MV Area (PHT): 3.20 cm     TR Peak grad:   35.5 mmHg MV Decel Time: 237 msec     TR Vmax:        298.00 cm/s MV E velocity: 127.00 cm/s                             SHUNTS                             Systemic VTI:  0.24 m                             Systemic Diam: 2.20 cm Sunit Tolia Electronically signed by Madonna Large Signature Date/Time: 09/13/2023/7:02:20 PM    Final     Endoscopic Studies: None this admission   Clinical Impression   83 year old gentleman with history of  CAD status post CABG and DES in 2017, atrial  fibrillation, hypertension, CVA, 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  Based upon clinical presentation of stuttering, painless hematochezia and maroon stool as well as prior history of diverticular bleeding, the etiology of current bleeding is most likely diverticular.  Upper GI bleeding less likely given the patient's clinical stability.  I concur with the clinical recommendation made by my colleagues over the weekend to pursue conservative management as diverticular bleeding will typically cease on its own.  Colonoscopy is of little utility in the setting of diverticular bleeding from an interventional perspective and there is risk in the setting of patient's recent demand ischemia.  He denies having any bowel movements or overt bleeding over the last 72 hours.  Hemoglobin has fluctuated and decreased to 7.3 yesterday for which he was received 1 unit of packed red blood cells.  Hemoglobin today is 9.5.  Remains hemodynamically stable.  Denies chest pain.  Given absence of overt GI bleeding and stable hemoglobin we discussed advancing diet and rechecking hemoglobin later today.  If stable could consider discharge.   Plan  Monitor for signs of overt GI  bleeding Monitor serial hemoglobin and hematocrit and transfuse as appropriate to maintain hemoglobin > = 8 If there is evidence of ongoing bleeding may consider radiographic evaluation for localization of source of bleeding.  AKI is improving so could potentially reconsider possibility of CTA if needed versus tagged red cell scan. No plan for endoscopic evaluation/intervention at this time.  Reviewed with patient and his son that it would be appropriate for him to have follow-up endoscopic evaluation in the outpatient setting once he is stable from a hemoglobin and cardiac standpoint. Continue Protonix  40 mg IV twice daily for now. Patient will need GI follow-up as an outpatient.  We have discussed that it would be appropriate to perform an EGD/colonoscopy when he is stable in the outpatient setting given that he has now had 2 hospitalizations for bleeding.  Patient has been seen by [facility] as well as [facility]-can confirm with patient if he has a preference which group he would like to follow-up with after discharge.   LOS: 3 days   Inocente CHRISTELLA Hausen  09/15/2023, 10:14 AM  Inocente Hausen, MD [facility]

## 2023-09-15 NOTE — TOC Transition Note (Signed)
 Transition of Care [facility]) - Discharge Note   Patient Details  Name: William Elliott MRN: [id number] Date of Birth: January 20, 1941  [facility] CM/SW Contact:  [name], RN Phone Number: 09/15/2023, 12:54 PM   Clinical Narrative:    For possible dc today, he has transport home.           Patient Goals and CMS Choice            Discharge Placement                       Discharge Plan and Services Additional resources added to the After Visit Summary for                                       Social Drivers of Health (SDOH) Interventions SDOH Screenings   Food Insecurity: No Food Insecurity (09/13/2023)  Housing: Low Risk  (09/13/2023)  Transportation Needs: No Transportation Needs (09/13/2023)  Utilities: Not At Risk (09/13/2023)  Alcohol Screen: Low Risk  (09/14/2023)  Financial Resource Strain: Low Risk  (09/14/2023)  Social Connections: Socially Integrated (09/13/2023)  Tobacco Use: Medium Risk (09/12/2023)     Readmission Risk Interventions    09/13/2023    3:37 PM 01/22/2023   11:58 AM  Readmission Risk Prevention Plan  Post Dischage Appt  Complete  Medication Screening  Complete  Transportation Screening Complete Complete  PCP or Specialist Appt within 5-7 Days Complete   Home Care Screening Complete   Medication Review (RN CM) Complete

## 2023-09-15 NOTE — Progress Notes (Signed)
 Progress Note   Patient: William Elliott FMW:[id number] DOB: May 30, 1940 DOA: 09/12/2023     3 DOS: the patient was seen and examined on 09/15/2023   Brief hospital course: William Elliott is a 83 y.o. male with medical history significant of PAF (not on OAC due to history of GI bleed), CAD s/p 4vCABG, HTN, HLD, COPD, bilateral carotid artery stenosis, history of CVA, history of GI bleeding, who was last admitted on 01/17/2023 with chest pain/NSTEMI in the setting of GIB p/w recurrent NSTEMI and GIB. He is admitted to [facility] service with Cardiology and GI evaluation. Patient got 2 unit PRBC 09/12/23 and one more on 09/14/23.  Assessment and Plan: Type II MI In the setting of severe blood loss anemia. Denies chest discomfort or shortness of breath. Troponin trended up to 9500. He received total 3 units PRBC.  No heparin / eliquis or aspirin  due to active bleeding, drop in h/h. Cardiology follow up appreciated. Echo showed EF 35-40%, global hypokinesis, mildly reduced RV function, PASP 50.5 mmHg.  Continue IV Lasix  40 mg twice daily for one more day. Will discuss aspirin  81mg  daily therapy with GI.  Acute GI bleed. Acute blood loss anemia S/p total 3 units PRBC transfusion Hb today 9.5 GI follow up appreciated. Advised to advance diet, can dc home if Hb stable, with outpatient GI work up in. Continue Protonix  40mg  oral daily.  Acute on chronic systolic CHF- Echo showed EF 35 to 40%, global hypokinesis, mild reduced RV function. Continue IV diuresis with 40mg  IV lasix  bid.  Net negative fluid balance 1.4 L. Continue lopressor  12.5 bid. Monitor daily weight, renal function, electrolytes, strict I/O. Cardiology follow up.  Acute kidney injury- In the setting of heart failure. Kidney function improved with IV diuresis. Continue to monitor renal function daily on IV diuresis. Monitor electrolytes.  COPD- No exacerbation. Continue home inhalers.  CAD s/p CABG- Continue statin. Hold  Aspirin  in the setting of GI bleed.  Paroxysmal Afib- Continue metoprolol  12.5 bid.  Not on anticoagulation due to GI bleed. Continue telemetry.      Out of bed to chair. Incentive spirometry. Nursing supportive care. Fall, aspiration precautions. Diet:  Diet Orders (From admission, onward)     Start     Ordered   09/15/23 0910  DIET SOFT Room service appropriate? Yes; Fluid consistency: Thin  Diet effective now       Question Answer Comment  Room service appropriate? Yes   Fluid consistency: Thin      09/15/23 0909           DVT prophylaxis: Place and maintain sequential compression device Start: 09/14/23 1530 SCDs Start: 09/12/23 0714  Level of care: Telemetry Cardiac   Code Status: Full Code  Subjective: Patient is seen and examined today morning. He is lying in bed.  Feels better, no active bleeding, chest pain or shortness of breath. Eager to go home.  Physical Exam: Vitals:   09/15/23 0359 09/15/23 0849 09/15/23 0850 09/15/23 0900  BP: 133/60   (!) 142/64  Pulse: 67   (!) 52  Resp: 19   20  Temp: 98.7 F (37.1 C)   98.6 F (37 C)  TempSrc: Oral   Oral  SpO2: 96% 95% 96% 94%  Weight:      Height:        General - Elderly Caucasian male, no apparent distress HEENT - PERRLA, EOMI, atraumatic head, non tender sinuses. Lung - Clear, basal rales, rhonchi, no wheezes. Heart - S1, S2  heard, no murmurs, rubs, 1+ pedal edema. Abdomen - Soft, non tender, bowel sounds good Neuro - Alert, awake and oriented x 3, non focal exam. Skin - Warm and dry.  Varicose veins noted on right lower extremity.  Data Reviewed:      Latest Ref Rng & Units 09/15/2023    3:57 AM 09/14/2023    2:53 AM 09/13/2023    8:40 PM  CBC  WBC 4.0 - 10.5 K/uL 11.5  12.6    Hemoglobin 13.0 - 17.0 g/dL 9.5  7.3  7.9   Hematocrit 39.0 - 52.0 % 29.6  22.3  24.2   Platelets 150 - 400 K/uL 273  223        Latest Ref Rng & Units 09/15/2023    3:57 AM 09/14/2023    2:53 AM 09/13/2023    3:01  AM  BMP  Glucose 70 - 99 mg/dL 93  89  889   BUN 8 - 23 mg/dL 23  32  43   Creatinine 0.61 - 1.24 mg/dL 8.77  8.84  8.55   Sodium 135 - 145 mmol/L 136  134  134   Potassium 3.5 - 5.1 mmol/L 3.3  2.9  3.8   Chloride 98 - 111 mmol/L 97  97  102   CO2 22 - 32 mmol/L 25  25  23    Calcium 8.9 - 10.3 mg/dL 8.8  8.1  8.3    No results found.   Family Communication: Discussed with patient, son they understand and agree. All questions answered.  Disposition: Status is: Inpatient Remains inpatient appropriate because:  IV lasix , Hb monitoring  Planned Discharge Destination: Home with Home Health     Time spent: 41 minutes  Author: Concepcion Riser, MD 09/15/2023 5:40 PM Secure chat 7am to 7pm For on call review [email or web address].

## 2023-09-15 NOTE — Progress Notes (Signed)
 Mobility Specialist Progress Note:   09/15/23 1015  Mobility  Activity Ambulated with assistance in hallway  Level of Assistance Standby assist, set-up cues, supervision of patient - no hands on  Assistive Device None  Distance Ambulated (ft) 300 ft  Activity Response Tolerated well  Mobility Referral Yes  Mobility visit 1 Mobility  Mobility Specialist Start Time (ACUTE ONLY) 1015  Mobility Specialist Stop Time (ACUTE ONLY) 1030  Mobility Specialist Time Calculation (min) (ACUTE ONLY) 15 min   Pt agreeable to mobility session. Required no physical assistance throughout ambulation with no AD use. No c/o throughout, pt back sitting EOB with all needs met, son present.  Therisa Rana Mobility Specialist Please contact via SecureChat or  Rehab office at [phone]

## 2023-09-15 NOTE — Progress Notes (Addendum)
 Progress Note  Patient Name: William Elliott Date of Encounter: 09/15/2023 [facility] Cardiologist: Jayson Sierras, MD   Interval Summary   Reports feeling much better today  Eager to get home  Vital Signs Vitals:   09/15/23 0359 09/15/23 0849 09/15/23 0850 09/15/23 0900  BP: 133/60   (!) 142/64  Pulse: 67   (!) 52  Resp: 19   20  Temp: 98.7 F (37.1 C)   98.6 F (37 C)  TempSrc: Oral   Oral  SpO2: 96% 95% 96% 94%  Weight:      Height:       Intake/Output Summary (Last 24 hours) at 09/15/2023 1002 Last data filed at 09/15/2023 9166 Gross per 24 hour  Intake 1525 ml  Output 1100 ml  Net 425 ml      09/15/2023    3:47 AM 09/12/2023    4:01 AM 06/17/2023    3:13 PM  Last 3 Weights  Weight (lbs) 161 lb 11.2 oz 171 lb 181 lb 9.6 oz  Weight (kg) 73.347 kg 77.565 kg 82.373 kg     Telemetry/ECG  Rate controlled A. Fib, HR 60s - Personally Reviewed  Physical Exam  GEN: No acute distress.   Cardiac: irregularly irregular, no murmurs, rubs, or gallops.  Respiratory: diminished breath sounds bilaterally. GI: Soft, nontender, non-distended  MS: 1+ LE edema  Assessment & Plan  83 y.o. 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  Acute blood loss anemia  DES to SVG-OM in 2017, s/p DES to SVG-OM in 2022, occlusion of PDA and SVG diagonal, patent LIMA-LAD  Presented with chest pain, radiation to left arm and jaw  Pain did not improve with NTG x 3, did improve with morphine   Patient reported bright red stools x 10 days  Hemoglobin 6.7 > 7.2 > 6.5 > 7.7 > 7.9 > 7.3 today Currently received 1 unit of pRBCs, preparing another unit today Troponin level 134 > 1,437 > 9,1738 > 9,510 Currently chest pain free  Suspect demand ischemia with  known ABLA Unable to start heparin  with patient actively requiring blood transfusions  Continue to hold AC in setting of ABLA.  Would restart ASA 81 mg daily once ok per GI Continue to transfuse with goal hemoglobin > 8, with severe CAD history  Holding lopressor  with bradycardia Not currently cardiac cath candidate given ongoing anemia    Acute on chronic systolic heart failure  Reported orthopnea, PND, LE edema prior to admission Recently underwent respiratory illness CXR showed pulmonary edema, pleural effusions BNP 352 Creatinine stable, 1.22 today (from 1.15 yesterday) Incomplete I&O's  Weight has not been checked since admission  Echo this admission: LVEF 35-40%, global hypokinesis, mildly reduced RV function, PASP 50.5 mmHg, BAE, mild to moderate MR, mild AR, dilated IVC Weight 161 lb today compared to 171 lb on admission  Continue IV Lasix  40 mg BID --remains volume overloaded on exam, would continue IV Lasix  today Holding lopressor  with bradycardia Continue strict I&O's, daily weights, daily BMPs   Permanent A. Fib  Bradycardia Frequent PVCs Cardiac monitor from Aug. 2024 showed permanent A. Fib with average HR 41, PVC burden 21.6% Recently had GI bleed Nov. 2024 where [facility] was stopped  Defer  re-starting AC as patient is currently anemic with active GI bleed Hold Lopressor  with bradycardia   Hypertension  Most recent BP 113/52, HR 60s Currently receiving IV diuresis  Hold Lopressor  as above   Per primary  Acute GI bleed Acute blood loss anemia  AKI COPD  History of CVA  Electrolyte disturbances   For questions or updates, please contact [facility] Please consult [email or web address] for contact info under       Signed, [name], PA-C   Patient seen and examined.  Agree with above documentation.  On exam, patient is alert and oriented, irregular rhythm, normal rate no murmurs, lungs CTAB, 1+ bilateral lower extremity edema, + JVD. Hgb improved to 9.5  after transfusion.  Cr stable at 1.2.  BP 142/64.  He remains volume overloaded, would continue IV Lasix  today  [name], MD

## 2023-09-15 NOTE — Plan of Care (Signed)

## 2023-09-15 NOTE — Progress Notes (Signed)
   Heart Failure Stewardship Pharmacist Progress Note   PCP: [name], MD PCP-Cardiologist: Jayson Sierras, MD    HPI:  83 yo M with PMH of afib, CAD s/p CABG, HTN, HLD, COPD, bilateral carotid artery stenosis, CVA, and GI bleeding.   Presented to the ED on 7/13 with chest pressure radiating to jaw and neck and blood in stool. Labs notable for Cr 1.55, BNP 351.8, troponin 134>1437, WBC 25.2 and Hb 6.7 s/p multiple transfusions. CXR showed bilateral pulmonary edema and pleural effusions consistent with congestive heart failure. ECHO 7/14 with LVEF 35-40% (last 45% in 01/2023), global hypokinesis, RV mildly reduced, moderately elevated PA pressure, mild to moderate MR. GI and cardiology have been consulted. No plans for colonoscopy or EGD this admission per GI - will plan to have done as outpatient.   Met with patient at bedside. Denies shortness of breath today. LE edema improved on exam but still 1+. He states he was taking lasix  20 mg daily instead of 40 mg daily because he was worried about side effects. He is agreeable to using [facility] pharmacy on discharge. He recently moved and switched his pharmacy from [facility] to [facility] - he states they provide delivery of his medications and he normally uses a pill box at home.    Current HF Medications: Diuretic: furosemide  40 mg IV BID  Prior to admission HF Medications: Diuretic: furosemide  20 mg daily  Pertinent Lab Values: Serum creatinine 1.22, BUN 23, Potassium 3.3, Sodium 136, BNP 351.8, Magnesium  2.1  Vital Signs: Weight: 161 lbs (admission weight: 171 lbs) Blood pressure: 130/60s  Heart rate: 50-60s  I/O: net +0.2L yesterday; net -0.7L since admission  Medication Assistance / Insurance Benefits Check: Does the patient have prescription insurance?  Yes Type of insurance plan: UHC Medicare + [place] Medicaid  Outpatient Pharmacy:  Prior to admission outpatient pharmacy: [facility] Is the patient willing to use [facility]  TOC pharmacy at discharge? Yes Is the patient willing to transition their outpatient pharmacy to utilize a [facility] outpatient pharmacy?   No    Assessment: 1. Acute on chronic systolic CHF (LVEF 35-40%). NYHA class III symptoms. - Continue furosemide  40 mg IV BID. Strict I/Os and daily weights. Keep K>4 and Mg>2. KCl 40 mEq BID ordered for replacement. Would give additional 40 mEq today. - Was on metoprolol  tartrate 12.5 mg BID, holding with bradycardia - with new HFrEF, would consolidate to metoprolol  XL 25 mg daily once restarted - ARB/ARNI and MRA pending improvement in BP - Consider starting SGLT2i prior to discharge   Plan: 1) Medication changes recommended at this time: - Extra KCl 40 mEq x 1 today - Switch to metoprolol  XL 25 mg daily once BB resumed  2) Patient assistance: - Jardiance /Farxiga copay $0  3)  Education  - Initial education complete - To be completed prior to discharge  Duwaine Plant, PharmD, BCPS Heart Failure Engineer, building services Phone [phone]

## 2023-09-15 NOTE — Progress Notes (Signed)
 [facility] night cross cover note:   The patient is complaining of some insomnia.  The patient and the patient's son, who is present at bedside, conveys that the patient is on prn Xanax  as an outpatient for anxiety, but also on scheduled evening Xanax  to help him sleep. Pt/his son requested that the patient receive a dose of Xanax  at this time to help him get some sleep so that he can be more wakeful for dayshift having been able to achieve some sleep in the interval.  Per their request, I subsequently ordered Xanax  0.5 mg p.o. x 1 dose now.   Eva Pore, DO Hospitalist

## 2023-09-16 ENCOUNTER — Other Ambulatory Visit (HOSPITAL_COMMUNITY): Payer: Self-pay

## 2023-09-16 ENCOUNTER — Encounter: Payer: Self-pay | Admitting: Gastroenterology

## 2023-09-16 DIAGNOSIS — I482 Chronic atrial fibrillation, unspecified: Secondary | ICD-10-CM | POA: Diagnosis not present

## 2023-09-16 DIAGNOSIS — K922 Gastrointestinal hemorrhage, unspecified: Secondary | ICD-10-CM

## 2023-09-16 DIAGNOSIS — D649 Anemia, unspecified: Secondary | ICD-10-CM | POA: Diagnosis not present

## 2023-09-16 DIAGNOSIS — D62 Acute posthemorrhagic anemia: Secondary | ICD-10-CM | POA: Diagnosis not present

## 2023-09-16 DIAGNOSIS — I2489 Other forms of acute ischemic heart disease: Secondary | ICD-10-CM | POA: Diagnosis not present

## 2023-09-16 DIAGNOSIS — I5023 Acute on chronic systolic (congestive) heart failure: Secondary | ICD-10-CM | POA: Diagnosis not present

## 2023-09-16 LAB — CBC
HCT: 28.8 % — ABNORMAL LOW (ref 39.0–52.0)
Hemoglobin: 9.4 g/dL — ABNORMAL LOW (ref 13.0–17.0)
MCH: 28.6 pg (ref 26.0–34.0)
MCHC: 32.6 g/dL (ref 30.0–36.0)
MCV: 87.5 fL (ref 80.0–100.0)
Platelets: 282 K/uL (ref 150–400)
RBC: 3.29 MIL/uL — ABNORMAL LOW (ref 4.22–5.81)
RDW: 15 % (ref 11.5–15.5)
WBC: 11 K/uL — ABNORMAL HIGH (ref 4.0–10.5)
nRBC: 0 % (ref 0.0–0.2)

## 2023-09-16 LAB — BASIC METABOLIC PANEL WITH GFR
Anion gap: 11 (ref 5–15)
BUN: 21 mg/dL (ref 8–23)
CO2: 25 mmol/L (ref 22–32)
Calcium: 9.1 mg/dL (ref 8.9–10.3)
Chloride: 98 mmol/L (ref 98–111)
Creatinine, Ser: 1.09 mg/dL (ref 0.61–1.24)
GFR, Estimated: >60 mL/min (ref >60)
Glucose, Bld: 114 mg/dL — ABNORMAL HIGH (ref 70–99)
Potassium: 3.8 mmol/L (ref 3.5–5.1)
Sodium: 134 mmol/L — ABNORMAL LOW (ref 135–145)

## 2023-09-16 LAB — MAGNESIUM: Magnesium: 1.8 mg/dL (ref 1.7–2.4)

## 2023-09-16 MED ORDER — FUROSEMIDE 40 MG PO TABS
40.0000 mg | ORAL_TABLET | Freq: Every day | ORAL | Status: DC
  Filled 2023-09-16: qty 1

## 2023-09-16 MED ORDER — EMPAGLIFLOZIN 10 MG PO TABS
10.0000 mg | ORAL_TABLET | Freq: Every day | ORAL | 1 refills | Status: DC
  Filled 2023-09-16: qty 90, 90d supply, fill #0

## 2023-09-16 MED ORDER — ISOSORBIDE MONONITRATE ER 30 MG PO TB24: 15.0000 mg | ORAL_TABLET | Freq: Every day | ORAL | Status: DC

## 2023-09-16 MED ORDER — EMPAGLIFLOZIN 10 MG PO TABS
10.0000 mg | ORAL_TABLET | Freq: Every day | ORAL | Status: DC
  Administered 2023-09-16: 10 mg via ORAL
  Filled 2023-09-16: qty 1

## 2023-09-16 MED ORDER — POTASSIUM CHLORIDE CRYS ER 20 MEQ PO TBCR: 40.0000 meq | EXTENDED_RELEASE_TABLET | Freq: Every day | ORAL | Status: DC

## 2023-09-16 MED ORDER — ASPIRIN 81 MG PO TBEC
81.0000 mg | DELAYED_RELEASE_TABLET | Freq: Every day | ORAL | Status: DC
  Administered 2023-09-16: 81 mg via ORAL
  Filled 2023-09-16: qty 1

## 2023-09-16 NOTE — Progress Notes (Signed)
   Heart Failure Stewardship Pharmacist Progress Note   PCP: [name], MD PCP-Cardiologist: Jayson Sierras, MD    HPI:  83 yo M with PMH of afib, CAD s/p CABG, HTN, HLD, COPD, bilateral carotid artery stenosis, CVA, and GI bleeding.   Presented to the ED on 7/13 with chest pressure radiating to jaw and neck and blood in stool. Labs notable for Cr 1.55, BNP 351.8, troponin 134>1437, WBC 25.2 and Hb 6.7 s/p multiple transfusions. CXR showed bilateral pulmonary edema and pleural effusions consistent with congestive heart failure. ECHO 7/14 with LVEF 35-40% (last 45% in 01/2023), global hypokinesis, RV mildly reduced, moderately elevated PA pressure, mild to moderate MR. GI and cardiology have been consulted. No plans for colonoscopy or EGD this admission per GI - will plan to have done as outpatient.   Met with patient at bedside. Denies shortness of breath today, trace edema on exam. GDMT limited by lower BP. He states he was taking lasix  20 mg daily instead of 40 mg daily because he was worried about side effects. He is agreeable to using [facility] pharmacy on discharge. He recently moved and switched his pharmacy from [facility] to [facility] - he states they provide delivery of his medications and he normally uses a pill box at home.    Current HF Medications: Diuretic: furosemide  40 mg PO daily  Prior to admission HF Medications: Diuretic: furosemide  20 mg daily  Pertinent Lab Values: Serum creatinine 1.09, BUN 21, Potassium 3.8, Sodium 134, BNP 351.8, Magnesium  1.8  Vital Signs: Weight: 160 lbs (admission weight: 171 lbs) Blood pressure: 100/60s  Heart rate: 50-60s  I/O: net +0.5L yesterday; net -0.9L since admission  Medication Assistance / Insurance Benefits Check: Does the patient have prescription insurance?  Yes Type of insurance plan: UHC Medicare + [place] Medicaid  Outpatient Pharmacy:  Prior to admission outpatient pharmacy: [facility] Is the patient willing to  use [facility] TOC pharmacy at discharge? Yes Is the patient willing to transition their outpatient pharmacy to utilize a [facility] outpatient pharmacy?   No    Assessment: 1. Acute on chronic systolic CHF (LVEF 35-40%). NYHA class II symptoms. - Agree with transitioning to furosemide  40 mg PO daily. Strict I/Os and daily weights. Keep K>4 and Mg>2. - Was on metoprolol  tartrate 12.5 mg BID, holding with bradycardia - with new HFrEF, would consolidate to metoprolol  XL 25 mg daily once restarted - ARB/ARNI and MRA pending improvement in BP - Consider starting SGLT2i prior to discharge   Plan: 1) Medication changes recommended at this time: - Start Jardiance  10 mg daily - Switch to metoprolol  XL 25 mg daily once BB resumed  2) Patient assistance: - Jardiance /Farxiga copay $0  3)  Education  - Patient has been educated on current HF medications and potential additions to HF medication regimen - Patient verbalizes understanding that over the next few months, these medication doses may change and more medications may be added to optimize HF regimen - Patient has been educated on basic disease state pathophysiology and goals of therapy   Duwaine Plant, PharmD, BCPS Heart Failure Stewardship Pharmacist Phone [phone]

## 2023-09-16 NOTE — Plan of Care (Signed)
  Problem: Education: Goal: Knowledge of General Education information will improve Description: Including pain rating scale, medication(s)/side effects and non-pharmacologic comfort measures Outcome: Progressing   Problem: Clinical Measurements: Goal: Diagnostic test results will improve Outcome: Progressing   

## 2023-09-16 NOTE — Progress Notes (Signed)
 Inpatient Progress Note     Patient Profile/Chief Complaint  83 year old male admitted chest pain and demand ischemia in the setting of hematochezia, maroon stool suspected to be related to diverticular bleeding, acute on chronic anemia.    Interval History   -- Denies having any bowel movements over last 96 hours -- No abdominal pain or cramping -- Hgb stable at 9.4 this morning    Objective   Vital signs in last 24 hours: Temp:  [97.7 F (36.5 C)-98.2 F (36.8 C)] 98.2 F (36.8 C) (07/17 0805) Pulse Rate:  [52-76] 60 (07/17 0805) Resp:  [14-20] 19 (07/17 0805) BP: (99-128)/(50-60) 109/60 (07/17 0805) SpO2:  [97 %-100 %] 100 % (07/17 0805) Weight:  [72.9 kg] 72.9 kg (07/17 0446) Last BM Date : 09/12/23 General:    Elderly gentleman resting quietly in bed Heart:  Regular rate and rhythm; no murmurs Lungs: Respirations even and unlabored, lungs CTA bilaterally Abdomen:  Soft, nontender and nondistended. Normal bowel sounds. Extremities:  Without edema. Neurologic:  Alert and oriented,  grossly normal neurologically. Psych:  Cooperative. Normal mood and affect.  Intake/Output from previous day: 07/16 0701 - 07/17 0700 In: 650 [P.O.:650] Out: 700 [Urine:700] Intake/Output this shift: Total I/O In: 100 [P.O.:100] Out: -   Lab Results: Recent Labs    09/14/23 0253 09/15/23 0357 09/16/23 0733  WBC 12.6* 11.5* 11.0*  HGB 7.3* 9.5* 9.4*  HCT 22.3* 29.6* 28.8*  PLT 223 273 282   BMET Recent Labs    09/14/23 0253 09/15/23 0357 09/16/23 0733  NA 134* 136 134*  K 2.9* 3.3* 3.8  CL 97* 97* 98  CO2 25 25 25   GLUCOSE 89 93 114*  BUN 32* 23 21  CREATININE 1.15 1.22 1.09  CALCIUM 8.1* 8.8* 9.1   LFT No results for input(s): PROT, ALBUMIN, AST, ALT, ALKPHOS, BILITOT, BILIDIR, IBILI in the last 72 hours. PT/INR No results for input(s): LABPROT, INR in the last 72 hours.  Studies/Results: No results found.   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  Based upon clinical presentation of stuttering, painless hematochezia and maroon stool as well as prior history of diverticular bleeding, the etiology of current bleeding is most likely diverticular.  Upper GI bleeding less likely given the patient's clinical stability.  I concur with the clinical recommendation made by my colleagues over the weekend to pursue conservative management as diverticular bleeding will typically cease on its own.  Colonoscopy is of little utility in the setting of diverticular bleeding from an interventional perspective and there is risk in the setting of patient's recent demand ischemia.  He denies having any bowel movements or overt bleeding over the last 96 hours.  Hemoglobin is now stable at 9.4.  Given spontaneous resolution of bleeding suspect diverticular source was because of recent episode.    Plan  Agree with that patient is stable for discharge today Resume home pantoprazole  40 mg orally daily Discussed GI follow-up with patient and son.  They would like to follow-up with [facility] in the future.  I will contact our office to have a follow-up appointment set up within the next 4 to 8 weeks.  We  previously discussed that it would be appropriate to perform an EGD/colonoscopy when he is stable in the outpatient setting now that he has had 2 hospitalizations for bleeding.  He and his son are in agreement.  His stability for future procedures can be assessed in the clinic.   LOS: 4 days   William Elliott  09/16/2023, 10:24 AM  William Hausen, MD [facility]

## 2023-09-16 NOTE — Progress Notes (Addendum)
 Progress Note  Patient Name: William Elliott Date of Encounter: 09/16/2023 [facility] Cardiologist: Jayson Sierras, MD   Interval Summary   Incomplete I/O's.  Creatinine stable at 1.09.  Hemoglobin stable at 9.4.  He denies any chest pain or dyspnea  Vital Signs Vitals:   09/15/23 2012 09/16/23 0041 09/16/23 0446 09/16/23 0805  BP: (!) 128/55 (!) 99/59 (!) 107/56 109/60  Pulse: 63 (!) 52 (!) 57 60  Resp: 18 17 18 19   Temp: 98 F (36.7 C) 98.2 F (36.8 C) 98.2 F (36.8 C) 98.2 F (36.8 C)  TempSrc: Oral Oral Oral Oral  SpO2: 100% 99% 99% 100%  Weight:   72.9 kg   Height:       Intake/Output Summary (Last 24 hours) at 09/16/2023 0936 Last data filed at 09/16/2023 0900 Gross per 24 hour  Intake 510 ml  Output 700 ml  Net -190 ml      09/16/2023    4:46 AM 09/15/2023    3:47 AM 09/12/2023    4:01 AM  Last 3 Weights  Weight (lbs) 160 lb 11.5 oz 161 lb 11.2 oz 171 lb  Weight (kg) 72.9 kg 73.347 kg 77.565 kg     Telemetry/ECG  Rate controlled A. Fib, HR 60s - Personally Reviewed  Physical Exam  GEN: No acute distress.   Cardiac: irregularly irregular, no murmurs, rubs, or gallops.  Respiratory: diminished breath sounds bilaterally. GI: Soft, nontender, non-distended  MS: Trivial LE edema  Assessment & Plan  83 y.o. 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  Acute blood loss anemia  DES to SVG-OM in 2017, s/p DES to SVG-OM in 2022, occlusion of PDA and SVG diagonal, patent LIMA-LAD  Presented with chest pain, radiation to left arm and jaw  Pain did not improve with NTG x 3, did improve with morphine   Patient reported bright red stools x 10 days  Hemoglobin 6.7 > 7.2 > 6.5 > 7.7 > 7.9 > 7.3 today Currently received 1 unit of  pRBCs, preparing another unit today Troponin level 134 > 1,437 > 9,1738 > 9,510 Currently chest pain free  Suspect demand ischemia with known ABLA Unable to start heparin  with patient actively requiring blood transfusions  Continue to hold AC in setting of ABLA.  Okay per GI to restart aspirin  81 mg daily given his coronary history Continue to transfuse with goal hemoglobin > 8, with severe CAD history  Holding lopressor  with bradycardia Not currently cardiac cath candidate given ongoing anemia    Acute on chronic systolic heart failure  Reported orthopnea, PND, LE edema prior to admission Recently underwent respiratory illness CXR showed pulmonary edema, pleural effusions BNP 352 Creatinine stable, 1.22 today (from 1.15 yesterday) Incomplete I&O's  Weight has not been checked since admission  Echo this admission: LVEF 35-40%, global hypokinesis, mildly reduced RV function, PASP 50.5 mmHg, BAE, mild to moderate MR, mild AR, dilated IVC I/O's not well recorded but weight is down 10 pounds on admission.  Volume status significantly improved, will transition to p.o. Lasix  BP has been soft, which is limited GDMT.  Will add jardiance  10 mg daily Holding lopressor  with bradycardia   Permanent A. Fib  Bradycardia Frequent PVCs Cardiac monitor  from Aug. 2024 showed permanent A. Fib with average HR 41, PVC burden 21.6% Recently had GI bleed Nov. 2024 where [facility] was stopped  Defer re-starting AC as patient is currently anemic with active GI bleed Hold Lopressor  with bradycardia   Hypertension  Most recent BP 113/52, HR 60s Currently receiving IV diuresis  Hold Lopressor  as above   Per primary  Acute GI bleed Acute blood loss anemia  AKI COPD  History of CVA  Electrolyte disturbances  [facility] will sign off.   Medication Recommendations: Lasix  40 mg daily, aspirin  81 mg daily, Jardiance  10 mg daily, imdur  15 mg daily Other recommendations (labs, testing, etc): BMET in 1  week Follow up as an outpatient: Has follow-up scheduled 7/22    For questions or updates, please contact [facility] Please consult [email or web address] for contact info under       Signed, [name], MD

## 2023-09-16 NOTE — Discharge Summary (Signed)
 Physician Discharge Summary   Patient: William Elliott MRN: [id number] DOB: [birth year]  Admit date:     09/12/2023  Discharge date: {dischdate:26783}  Discharge Physician: Concepcion Riser   PCP: [name], MD   Recommendations at discharge:  {Tip this will not be part of the note when signed- Example include specific recommendations for outpatient follow-up, pending tests to follow-up on. (Optional):26781}  ***  Discharge Diagnoses: Principal Problem:   Symptomatic anemia Active Problems:   NSTEMI (non-ST elevated myocardial infarction) (HCC)   ABLA (acute blood loss anemia)   Demand ischemia (HCC)   Acute on chronic systolic heart failure (HCC)   Chronic atrial fibrillation (HCC)  Resolved Problems:   * No resolved hospital problems. [facility] Course: No notes on file  Assessment and Plan: No notes have been filed under this hospital service. Service: Hospitalist     {Tip this will not be part of the note when signed Body mass index is 23.06 kg/m. , ,  (Optional):26781}  {(NOTE) Pain control PDMP Statment (Optional):26782} Consultants: *** Procedures performed: ***  Disposition: {Plan; Disposition:26390} Diet recommendation:  Discharge Diet Orders (From admission, onward)     Start     Ordered   09/16/23 0000  Diet - low sodium heart healthy        09/16/23 0958           {Diet_Plan:26776} DISCHARGE MEDICATION: Allergies as of 09/16/2023       Reactions   Anoro Ellipta  [umeclidinium-vilanterol] Other (See Comments)   Cardiac problems   Lipitor [atorvastatin Calcium] Other (See Comments)   Unknown reaction   Niaspan [niacin] Other (See Comments)   Unknown reaction   Tricor [fenofibrate] Other (See Comments)   Fatigue    Zocor [simvastatin] Other (See Comments)   Unknown reaction     Med Rec must be completed prior to using this [facility]***       Follow-up Information     Connect with your PCP/Specialist as discussed. Schedule  an appointment as soon as possible for a visit .   Contact information: [email or web address] Call our physician referral line at [phone].        [facility] Follow up in 5 day(s).   Specialty: Cardiology Why: Hospital follow up 09/21/2023 @ 3:30 pm PLEASE bring a current medication list to appointment FREE valet parking, Entrance C, off [employer] [facility] information: [address] [place]  [phone]               Discharge Exam: William Elliott   09/12/23 0401 09/15/23 0347 09/16/23 0446  Weight: 77.6 kg 73.3 kg 72.9 kg   ***  Condition at discharge: {DC Condition:26389}  The results of significant diagnostics from this hospitalization (including imaging, microbiology, ancillary and laboratory) are listed below for reference.   Imaging Studies: ECHOCARDIOGRAM COMPLETE Result Date: 09/13/2023    ECHOCARDIOGRAM REPORT   Patient Name:   William Elliott Date of Exam: 09/13/2023 Medical Rec #:  [id number]       Height:       70.0 in Accession #:    [id number]      Weight:       171.0 lb Date of Birth:  [birth year]      BSA:          1.953 m Patient Age:    83 years        BP:  119/78 mmHg Patient Gender: M               HR:           50 bpm. Exam Location:  Inpatient Procedure: 2D Echo, Cardiac Doppler and Color Doppler (Both Spectral and Color            Flow Doppler were utilized during procedure). Indications:    Chest pain, unspecified  History:        Patient has prior history of Echocardiogram examinations, most                 recent 01/18/2023. CHF, Previous Myocardial Infarction and CAD,                 Abnormal ECG, COPD, Arrythmias:Atrial Fibrillation,                 Signs/Symptoms:Dyspnea and Shortness of Breath; Risk                 Factors:Hypertension and Dyslipidemia.  Sonographer:    Ellouise Mose RDCS  Referring Phys: [id number] LOGAN N LOCKWOOD IMPRESSIONS  1. Left ventricular ejection fraction, by estimation, is 35 to 40%. The left ventricle has moderately decreased function. The left ventricle demonstrates global hypokinesis. Left ventricular diastolic function could not be evaluated. There is the interventricular septum is flattened in systole and diastole, consistent with right ventricular pressure and volume overload.  2. Right ventricular systolic function is mildly reduced. The right ventricular size is moderately enlarged. There is moderately elevated pulmonary artery systolic pressure. The estimated right ventricular systolic pressure is 50.5 mmHg.  3. Left atrial size was moderately dilated.  4. Right atrial size was mildly dilated.  5. The mitral valve is degenerative. Mild to moderate mitral valve regurgitation.  6. The aortic valve is tricuspid. Aortic valve regurgitation is mild. Aortic valve sclerosis is present, with no evidence of aortic valve stenosis.  7. The inferior vena cava is dilated in size with <50% respiratory variability, suggesting right atrial pressure of 15 mmHg. Comparison(s): A prior study was performed on 01/18/2023. LVEF was reported to be 45% and now 35-40%. FINDINGS  Left Ventricle: Left ventricular ejection fraction, by estimation, is 35 to 40%. The left ventricle has moderately decreased function. The left ventricle demonstrates global hypokinesis. The left ventricular internal cavity size was normal in size. There is no left ventricular hypertrophy. The interventricular septum is flattened in systole and diastole, consistent with right ventricular pressure and volume overload. Left ventricular diastolic function could not be evaluated due to atrial fibrillation. Left ventricular diastolic function could not be evaluated. Right Ventricle: The right ventricular size is moderately enlarged. Right vetricular wall thickness was not well visualized. Right ventricular systolic  function is mildly reduced. There is moderately elevated pulmonary artery systolic pressure. The tricuspid regurgitant velocity is 2.98 m/s, and with an assumed right atrial pressure of 15 mmHg, the estimated right ventricular systolic pressure is 50.5 mmHg. Left Atrium: Left atrial size was moderately dilated. Right Atrium: Right atrial size was mildly dilated. Pericardium: There is no evidence of pericardial effusion. Mitral Valve: The mitral valve is degenerative in appearance. There is mild thickening of the mitral valve leaflet(s). Normal mobility of the mitral valve leaflets. Mild mitral annular calcification. Mild to moderate mitral valve regurgitation. Tricuspid Valve: The tricuspid valve is normal in structure. Tricuspid valve regurgitation is mild . No evidence of tricuspid stenosis. Aortic Valve: The aortic valve is tricuspid. Aortic valve regurgitation is mild. Aortic valve sclerosis  is present, with no evidence of aortic valve stenosis. Aortic valve mean gradient measures 5.0 mmHg. Aortic valve peak gradient measures 13.4 mmHg. Aortic valve area, by VTI measures 2.76 cm. Pulmonic Valve: The pulmonic valve was normal in structure. Pulmonic valve regurgitation is mild to moderate. No evidence of pulmonic stenosis. Aorta: The aortic root and ascending aorta are structurally normal, with no evidence of dilitation. Venous: The inferior vena cava is dilated in size with less than 50% respiratory variability, suggesting right atrial pressure of 15 mmHg. IAS/Shunts: The atrial septum is grossly normal.  LEFT VENTRICLE PLAX 2D LVIDd:         5.50 cm LVIDs:         4.20 cm LV PW:         1.20 cm LV IVS:        1.20 cm LVOT diam:     2.20 cm LV SV:         90 LV SV Index:   46 LVOT Area:     3.80 cm  LV Volumes (MOD) LV vol d, MOD A2C: 180.0 ml LV vol d, MOD A4C: 159.0 ml LV vol s, MOD A2C: 113.0 ml LV vol s, MOD A4C: 95.8 ml LV SV MOD A2C:     67.0 ml LV SV MOD A4C:     159.0 ml LV SV MOD BP:      65.8 ml RIGHT  VENTRICLE             IVC RV S prime:     10.30 cm/s  IVC diam: 2.70 cm TAPSE (M-mode): 1.2 cm LEFT ATRIUM             Index        RIGHT ATRIUM           Index LA diam:        5.80 cm 2.97 cm/m   RA Area:     23.40 cm LA Vol (A2C):   79.0 ml 40.45 ml/m  RA Volume:   73.50 ml  37.64 ml/m LA Vol (A4C):   99.8 ml 51.10 ml/m LA Biplane Vol: 90.1 ml 46.14 ml/m  AORTIC VALVE AV Area (Vmax):    2.39 cm AV Area (Vmean):   2.80 cm AV Area (VTI):     2.76 cm AV Vmax:           183.00 cm/s AV Vmean:          94.600 cm/s AV VTI:            0.327 m AV Peak Grad:      13.4 mmHg AV Mean Grad:      5.0 mmHg LVOT Vmax:         115.00 cm/s LVOT Vmean:        69.700 cm/s LVOT VTI:          0.237 m LVOT/AV VTI ratio: 0.72  AORTA Ao Root diam: 3.80 cm Ao Asc diam:  3.80 cm MITRAL VALVE                TRICUSPID VALVE MV Area (PHT): 3.20 cm     TR Peak grad:   35.5 mmHg MV Decel Time: 237 msec     TR Vmax:        298.00 cm/s MV E velocity: 127.00 cm/s                             SHUNTS  Systemic VTI:  0.24 m                             Systemic Diam: 2.20 cm Sunit Tolia Electronically signed by Madonna Large Signature Date/Time: 09/13/2023/7:02:20 PM    Final    DG Chest Port 1 View Result Date: 09/12/2023 EXAM: 1 VIEW XRAY OF THE CHEST 09/12/2023 04:27:42 AM COMPARISON: 1 view chest x-ray dated 09/19/2022. CLINICAL HISTORY: CP. FINDINGS: LUNGS AND PLEURA: Mild edema is present bilaterally. Small effusions are suspected. HEART AND MEDIASTINUM: The heart is enlarged. Atherosclerotic calcifications are present at the aortic arch. BONES AND SOFT TISSUES: No acute osseous abnormality. IMPRESSION: 1. Cardiomegaly with mild bilateral pulmonary edema and suspected small effusions, consistent with congestive heart failure . 2. Atherosclerosis. Electronically signed by: Lonni Necessary MD 09/12/2023 04:44 AM EDT RP Workstation: [id number]    Microbiology: Results for orders placed or performed during  the hospital encounter of 01/17/23  MRSA Next Gen by PCR, Nasal     Status: None   Collection Time: 01/17/23 12:25 PM   Specimen: Nasal Mucosa; Nasal Swab  Result Value Ref Range Status   MRSA by PCR Next Gen NOT DETECTED NOT DETECTED Final    Comment: (NOTE) The GeneXpert MRSA Assay (FDA approved for NASAL specimens only), is one component of a comprehensive MRSA colonization surveillance program. It is not intended to diagnose MRSA infection nor to guide or monitor treatment for MRSA infections. Test performance is not FDA approved in patients less than 4 years old. Performed at [facility], [address], [place], [place] [address]     Labs: CBC: Recent Labs  Lab 09/12/23 0407 09/12/23 1505 09/13/23 0301 09/13/23 1358 09/13/23 2040 09/14/23 0253 09/15/23 0357 09/16/23 0733  WBC 25.2*  --  12.4*  --   --  12.6* 11.5* 11.0*  HGB 6.7*   < > 6.5* 7.7* 7.9* 7.3* 9.5* 9.4*  HCT 21.6*   < > 20.9* 23.9* 24.2* 22.3* 29.6* 28.8*  MCV 89.6  --  87.4  --   --  85.4 86.0 87.5  PLT 390  --  260  --   --  223 273 282   < > = values in this interval not displayed.   Basic Metabolic Panel: Recent Labs  Lab 09/12/23 0407 09/13/23 0301 09/14/23 0253 09/15/23 0357 09/16/23 0733  NA 138 134* 134* 136 134*  K 4.4 3.8 2.9* 3.3* 3.8  CL 106 102 97* 97* 98  CO2 20* 23 25 25 25   GLUCOSE 170* 110* 89 93 114*  BUN 48* 43* 32* 23 21  CREATININE 1.55* 1.44* 1.15 1.22 1.09  CALCIUM 9.0 8.3* 8.1* 8.8* 9.1  MG  --   --  1.7 2.1 1.8   Liver Function Tests: No results for input(s): AST, ALT, ALKPHOS, BILITOT, PROT, ALBUMIN in the last 168 hours. CBG: No results for input(s): GLUCAP in the last 168 hours.  Discharge time spent: {LESS THAN/GREATER UYJW:73611} 30 minutes.  Signed: Concepcion Riser, MD [facility] 09/16/2023

## 2023-09-17 ENCOUNTER — Telehealth: Payer: Self-pay

## 2023-09-17 NOTE — Transitions of Care (Post Inpatient/ED Visit) (Signed)
   09/17/2023  Name: BREAKER SPRINGER MRN: [id number] DOB: 08-09-1940  Today's TOC FU Call Status: Today's TOC FU Call Status:: Unsuccessful Call (1st Attempt) Unsuccessful Call (1st Attempt) Date: 09/17/23  Attempted to reach the patient regarding the most recent Inpatient/ED visit.  Follow Up Plan: Additional outreach attempts will be made to reach the patient to complete the Transitions of Care (Post Inpatient/ED visit) call.   Sherrye Puga J. Meekah Math RN, MSN [facility], [facility] RN Care Manager Direct Dial: [phone]  Fax: [phone] Website: [email or web address]

## 2023-09-20 ENCOUNTER — Telehealth: Payer: Self-pay | Admitting: *Deleted

## 2023-09-20 NOTE — Transitions of Care (Post Inpatient/ED Visit) (Signed)
   09/20/2023  Name: William Elliott MRN: [id number] DOB: May 13, 1940  Today's TOC FU Call Status: Today's TOC FU Call Status:: Unsuccessful Call (2nd Attempt) Unsuccessful Call (2nd Attempt) Date: 09/20/23  Attempted to reach the patient regarding the most recent Inpatient/ED visit.  Follow Up Plan: Additional outreach attempts will be made to reach the patient to complete the Transitions of Care (Post Inpatient/ED visit) call.   Andrea Dimes RN, BSN [place]  [facility] RN Care Manager [phone]

## 2023-09-21 ENCOUNTER — Encounter (HOSPITAL_COMMUNITY)

## 2023-09-21 ENCOUNTER — Telehealth: Payer: Self-pay

## 2023-09-21 NOTE — Transitions of Care (Post Inpatient/ED Visit) (Signed)
   09/21/2023  Name: William Elliott MRN: [id number] DOB: 08/04/1940  Today's TOC FU Call Status: Today's TOC FU Call Status:: Unsuccessful Call (3rd Attempt) Unsuccessful Call (3rd Attempt) Date: 09/21/23  Attempted to reach the patient regarding the most recent Inpatient/ED visit.  Follow Up Plan: No further outreach attempts will be made at this time. We have been unable to contact the patient.  Rodolphe Edmonston J. Kenya Kook RN, MSN [facility], [facility] RN Care Manager Direct Dial: [phone]  Fax: [phone] Website: [email or web address]

## 2023-09-29 ENCOUNTER — Telehealth (HOSPITAL_COMMUNITY): Payer: Self-pay

## 2023-09-29 NOTE — Progress Notes (Incomplete)
 HEART & VASCULAR TRANSITION OF CARE CONSULT NOTE     Referring Physician: [name], MD  Cardiologist: Dr. Debera  Chief Complaint:   HPI: Referred to clinic by *** for heart failure consultation.   William Elliott is a 83 y.o. male with history of CAD s/p CABG in 1999 and DES to OM in 2017 and 2022, HFmrEF, permanent AF not on anticoagulation, hx CVA, bradycardia, frequent PVCs, hx GI bleed.  She was admitted in 11/24 with NSTEMI. Hs troponin > 5000 and had ECG changes. Aggressive management limited by GI bleed requiring multiple transfusions. GI saw patient and felt bleeding most likely 2/2 diverticular bleed. Cardiac cath discussed with patient but medical therapy ultimately decided. Echo with EF 45%, severe asymmetric LVH, septal flattening, RV mildly reduced, severe BAE. He was also noted to have bradycardia with rates in the 40s and frequent PVCs.  He was readmitted in July 2025 with chest pain and bright red stools X 10 days. HS troponin elevated to 1400. Felt to be 2/2 demand ischemia in setting of anemia/GI bleed. Echo during admit with LVEF 35-40%, septal flattening, RV enlarged and mildly HK, RVSP 50 mmHg. He was volume overloaded and required IV diuresis in addition to transfusions. GDMT limited by BP. 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 He did not undergo endoscopy.  He is here for post hospital CHF follow-up.   Past Medical History:  Diagnosis Date   Acute lower GI bleeding 2008   Anxiety    Asthma    Carotid artery disease (HCC)    Asymptomatic left carotid bruit   COPD (chronic obstructive pulmonary disease) (HCC)    Coronary artery disease    a. CABG - 1999 with LIMA-LAD, SVG-DIAG, SVG-OM, SVG-RPDA  b. Cath in setting of NSTEMI 03/18/2015: patent LIMA-LAD, occluded SVG-RCA, occluded SVG-D1, 99% stenosis of SVG-OM treated w/ DES   Depression    Dyslipidemia    Erectile dysfunction    Essential hypertension    History of blood transfusion  2008   History of stroke    Hyperlipidemia    Myocardial infarction [facility])     Current Outpatient Medications  Medication Sig Dispense Refill   acetaminophen  (TYLENOL ) 500 MG tablet Take 1,000 mg by mouth 2 (two) times daily as needed for moderate pain (pain score 4-6), fever or headache.     albuterol  (VENTOLIN  HFA) 108 (90 Base) MCG/ACT inhaler Inhale 2 puffs into the lungs every 6 (six) hours as needed for wheezing or shortness of breath.     ALPRAZolam  (XANAX ) 1 MG tablet Take 1 tablet (1 mg total) by mouth at bedtime as needed for anxiety. (Patient taking differently: Take 0.5-1 mg by mouth every 8 (eight) hours as needed for anxiety.) 10 tablet 0   Ascorbic Acid  (VITAMIN C  PO) Take 1 tablet by mouth daily with breakfast.     aspirin  EC 81 MG tablet Take 1 tablet (81 mg total) by mouth daily. 90 tablet 3   B Complex-C (SUPER B COMPLEX PO) Take 1 capsule by mouth daily with breakfast.     CHELATED ZINC  PO Take 1 tablet by mouth daily with breakfast.     empagliflozin  (JARDIANCE ) 10 MG TABS tablet Take 1 tablet (10 mg total) by mouth daily. 90 tablet 1   Ferrous Sulfate (IRON PO) Take 1 tablet by mouth daily with breakfast.     furosemide  (LASIX ) 40 MG tablet TAKE 1 TABLET BY MOUTH DAILY - EMERGENCY FILL FAXED MD (  Patient not taking: Reported on 09/12/2023) 30 tablet 2   ipratropium-albuterol  (DUONEB) 0.5-2.5 (3) MG/3ML SOLN Take 3 mLs by nebulization every 4 (four) hours as needed (wheezing, shortness of breath).     isosorbide  mononitrate (IMDUR ) 30 MG 24 hr tablet Take 0.5 tablets (15 mg total) by mouth daily.     Multiple Vitamins-Minerals (MENS 50+ MULTIVITAMIN) TABS Take 1 tablet by mouth daily with breakfast.     NEXLIZET 180-10 MG TABS Take 1 tablet by mouth daily.     nitroGLYCERIN  (NITROSTAT ) 0.4 MG SL tablet DISSOLVE 1 TABLET UNDER THE TONGUE EVERY 5 MINUTES AS NEEDED FOR CHEST PAIN. DO NOT EXCEED 3 DOSES IN 15 MIN. CALL 911 IF NO RELIEF 25 tablet 3   Omega-3 Fatty Acids (FISH OIL  PO) Take 1 capsule by mouth daily with breakfast.     pantoprazole  (PROTONIX ) 40 MG tablet TAKE 1 TABLET BY MOUTH EVERY DAY 90 tablet 1   potassium chloride  SA (KLOR-CON  M) 20 MEQ tablet Take 2 tablets (40 mEq total) by mouth daily.     Tiotropium Bromide -Olodaterol (STIOLTO RESPIMAT ) 2.5-2.5 MCG/ACT AERS Inhale 2 puffs into the lungs daily.     No current facility-administered medications for this visit.    Allergies  Allergen Reactions   Anoro Ellipta  [Umeclidinium-Vilanterol] Other (See Comments)    Cardiac problems   Lipitor [Atorvastatin Calcium] Other (See Comments)    Unknown reaction   Niaspan [Niacin] Other (See Comments)    Unknown reaction   Tricor [Fenofibrate] Other (See Comments)    Fatigue    Zocor [Simvastatin] Other (See Comments)    Unknown reaction      Social History   Socioeconomic History   Marital status: Married    Spouse name: Not on file   Number of children: Not on file   Years of education: Not on file   Highest education level: Not on file  Occupational History   Occupation: Welder x 10 yrs   Tobacco Use   Smoking status: Former    Types: Cigars    Quit date: 03/02/1997    Years since quitting: 26.5    Passive exposure: Never   Smokeless tobacco: Never   Tobacco comments:    smoked cigars x 10 yrs, never smoked cigarettes  Vaping Use   Vaping status: Never Used  Substance and Sexual Activity   Alcohol use: No    Alcohol/week: 0.0 standard drinks of alcohol    Comment: drank some when I was young; quit in the 1990s   Drug use: No   Sexual activity: Not Currently  Other Topics Concern   Not on file  Social History Narrative   Not on file   Social Drivers of Health   Financial Resource Strain: Low Risk  (09/14/2023)   Overall Financial Resource Strain (CARDIA)    Difficulty of Paying Living Expenses: Not very hard  Food Insecurity: No Food Insecurity (09/13/2023)   Hunger Vital Sign    Worried About Running Out of Food in the Last  Year: Never true    Ran Out of Food in the Last Year: Never true  Transportation Needs: No Transportation Needs (09/13/2023)   PRAPARE - Administrator, Civil Service (Medical): No    Lack of Transportation (Non-Medical): No  Physical Activity: Not on file  Stress: Not on file  Social Connections: Socially Integrated (09/13/2023)   Social Connection and Isolation Panel    Frequency of Communication with Friends and Family: More than three times  a week    Frequency of Social Gatherings with Friends and Family: More than three times a week    Attends Religious Services: More than 4 times per year    Active Member of [employer] or Organizations: Yes    Attends Engineer, structural: More than 4 times per year    Marital Status: Married  Catering manager Violence: Not At Risk (09/13/2023)   Humiliation, Afraid, Rape, and Kick questionnaire    Fear of Current or Ex-Partner: No    Emotionally Abused: No    Physically Abused: No    Sexually Abused: No      Family History  Problem Relation Age of Onset   Heart disease Father    Heart attack Father    Hypertension Mother    Stroke Mother     There were no vitals filed for this visit.  PHYSICAL EXAM: General:  Well appearing. No respiratory difficulty HEENT: normal Neck: supple. no JVD. Carotids 2+ bilat; no bruits. No lymphadenopathy or thryomegaly appreciated. Cor: PMI nondisplaced. Regular rate & rhythm. No rubs, gallops or murmurs. Lungs: clear Abdomen: soft, nontender, nondistended. No hepatosplenomegaly. No bruits or masses. Good bowel sounds. Extremities: no cyanosis, clubbing, rash, edema Neuro: alert & oriented x 3, cranial nerves grossly intact. moves all 4 extremities w/o difficulty. Affect pleasant.  ECG:   ASSESSMENT & PLAN: HFmrEF  2. Permanent atrial fibrillation Bradycardia  3. PVCs  4. CAD     Referred to HFSW (PCP, Medications, Transportation, ETOH Abuse, Drug Abuse, Insurance, Financial  ): Yes or No Refer to Pharmacy: Yes or No Refer to Home Health: Yes on No Refer to Advanced [facility] Clinic: Yes or no  Refer to General Cardiology: Yes or No  Follow up

## 2023-09-29 NOTE — Telephone Encounter (Signed)
 Called to confirm/remind patient of their appointment at the [facility] on 09/30/2023 2:00.   Appointment:   [] Confirmed  [] Left mess   [x] No answer/No voice mail  [] VM Full/unable to leave message  [] Phone not in service  Patient reminded to bring all medications and/or complete list.  Confirmed patient has transportation. Gave directions, instructed to utilize valet parking.

## 2023-09-30 ENCOUNTER — Encounter (HOSPITAL_COMMUNITY)

## 2023-09-30 ENCOUNTER — Telehealth (HOSPITAL_COMMUNITY): Payer: Self-pay | Admitting: Cardiology

## 2023-09-30 NOTE — Telephone Encounter (Signed)
Patients son aware and voiced understanding

## 2023-09-30 NOTE — Telephone Encounter (Signed)
 Patients son called with concerns of diarrhea. Reports diarrhea x 5 days  With recent hospitalization (GI bleed) and new cardiac condition, , ok from cardiac standpoint to take something stronger/ different anti diarrheal   Reports he is using imodium OTC with no relief  Patient reports he feels fine, no weakness, fever, body aches, etc

## 2023-09-30 NOTE — Telephone Encounter (Signed)
 We have never seen this patient in clinic. This should be addressed with his Cardiologist or PCP

## 2023-10-04 NOTE — Progress Notes (Signed)
 HEART & VASCULAR TRANSITION OF CARE CONSULT NOTE     Referring Physician: Dr. Darci PCP: William Norleen PEDLAR, MD  Cardiologist: Dr. Debera  Chief Complaint: HFmrEF  HPI: Referred to clinic by Dr. Darci for heart failure consultation.   William Elliott is a 83 y.o. 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  8/24: Cardiac monitor showed permanent A. Fib with average HR 41, PVC burden 21.6%   He was admitted 11/24 with NSTEMI. Hs troponin > 5000 and had ECG changes. Aggressive management limited by GI bleed requiring multiple transfusions. GI saw patient and felt bleeding most likely 2/2 diverticular bleed. Cardiac cath discussed with patient but medical therapy ultimately decided. Echo with EF 45%, severe asymmetric LVH, septal flattening, RV mildly reduced, severe BAE. He was also noted to have bradycardia with rates in the 40s and frequent PVCs.  He was readmitted in July 2025 with chest pain and bright red stools X 10 days. HS troponin elevated to 1400. Felt to be 2/2 demand ischemia in setting of anemia/GI bleed. Echo during admit with LVEF 35-40%, septal flattening, RV enlarged and mildly HK, RVSP 50 mmHg. He was volume overloaded and required IV diuresis in addition to transfusions. GDMT limited by BP. 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 He did not undergo endoscopy.  Today he presents for [facility] clinic visit with his son. Overall feeling ok. Denies palpitations, CP, or PND/Orthopnea. Reports ongoing swelling in his legs. Has been feeling dizzy since starting the medications, though he has been taking Imdur  30 mg BID. SOB with activity. Appetite ok, his wife primarily cooks. They use [employer] Dash. No fever or chills. Does not weight daily at home. Taking all medications. Denies ETOH, tobacco or drug use. Dad passed away from MI, mom passed away  from stroke. Sister with HTN. No cardiac history in children. His son, him and his wife just moved close together on 10 acres [place] land. His son plans on going on walks with him to keep him active.    Past Medical History:  Diagnosis Date   Acute lower GI bleeding 2008   Anxiety    Asthma    Carotid artery disease (HCC)    Asymptomatic left carotid bruit   COPD (chronic obstructive pulmonary disease) (HCC)    Coronary artery disease    a. CABG - 1999 with LIMA-LAD, SVG-DIAG, SVG-OM, SVG-RPDA  b. Cath in setting of NSTEMI 03/18/2015: patent LIMA-LAD, occluded SVG-RCA, occluded SVG-D1, 99% stenosis of SVG-OM treated w/ DES   Depression    Dyslipidemia    Erectile dysfunction    Essential hypertension    History of blood transfusion 2008   History of stroke    Hyperlipidemia    Myocardial infarction William Elliott)     Current Outpatient Medications  Medication Sig Dispense Refill   acetaminophen  (TYLENOL ) 500 MG tablet Take 1,000 mg by mouth 2 (two) times daily as needed for moderate pain (pain score 4-6), fever or headache.     albuterol  (VENTOLIN  HFA) 108 (90 Base) MCG/ACT inhaler Inhale 2 puffs into the lungs every 6 (six) hours as needed for wheezing or shortness of breath.     ALPRAZolam  (XANAX ) 1 MG tablet Take 1 tablet (1 mg total) by mouth at bedtime as needed for anxiety. 10 tablet 0   Ascorbic Acid  (VITAMIN C  PO)  Take 1 tablet by mouth daily with breakfast.     aspirin  EC 81 MG tablet Take 1 tablet (81 mg total) by mouth daily. 90 tablet 3   B Complex-C (SUPER B COMPLEX PO) Take 1 capsule by mouth daily with breakfast.     CHELATED ZINC  PO Take 1 tablet by mouth daily with breakfast.     Ferrous Sulfate (IRON PO) Take 1 tablet by mouth daily with breakfast.     furosemide  (LASIX ) 40 MG tablet TAKE 1 TABLET BY MOUTH DAILY - EMERGENCY FILL FAXED MD 30 tablet 2   ipratropium-albuterol  (DUONEB) 0.5-2.5 (3) MG/3ML SOLN Take 3 mLs by nebulization every 4 (four) hours as needed (wheezing,  shortness of breath).     isosorbide  mononitrate (IMDUR ) 30 MG 24 hr tablet Take 0.5 tablets (15 mg total) by mouth daily.     Multiple Vitamins-Minerals (MENS 50+ MULTIVITAMIN) TABS Take 1 tablet by mouth daily with breakfast.     NEXLIZET 180-10 MG TABS Take 1 tablet by mouth daily.     nitroGLYCERIN  (NITROSTAT ) 0.4 MG SL tablet DISSOLVE 1 TABLET UNDER THE TONGUE EVERY 5 MINUTES AS NEEDED FOR CHEST PAIN. DO NOT EXCEED 3 DOSES IN 15 MIN. CALL 911 IF NO RELIEF 25 tablet 3   Omega-3 Fatty Acids (FISH OIL PO) Take 1 capsule by mouth daily with breakfast.     pantoprazole  (PROTONIX ) 40 MG tablet TAKE 1 TABLET BY MOUTH EVERY DAY 90 tablet 1   potassium chloride  SA (KLOR-CON  M) 20 MEQ tablet Take 2 tablets (40 mEq total) by mouth daily.     Tiotropium Bromide -Olodaterol (STIOLTO RESPIMAT ) 2.5-2.5 MCG/ACT AERS Inhale 2 puffs into the lungs daily.     empagliflozin  (JARDIANCE ) 10 MG TABS tablet Take 1 tablet (10 mg total) by mouth daily. (Patient not taking: Reported on 10/07/2023) 90 tablet 1   No current facility-administered medications for this encounter.    Allergies  Allergen Reactions   Anoro Ellipta  [Umeclidinium-Vilanterol] Other (See Comments)    Cardiac problems   Lipitor [Atorvastatin Calcium] Other (See Comments)    Unknown reaction   Niaspan [Niacin] Other (See Comments)    Unknown reaction   Tricor [Fenofibrate] Other (See Comments)    Fatigue    Zocor [Simvastatin] Other (See Comments)    Unknown reaction      Social History   Socioeconomic History   Marital status: Married    Spouse name: Not on file   Number of children: Not on file   Years of education: Not on file   Highest education level: Not on file  Occupational History   Occupation: Welder x 10 yrs   Tobacco Use   Smoking status: Former    Types: Cigars    Quit date: 03/02/1997    Years since quitting: 26.6    Passive exposure: Never   Smokeless tobacco: Never   Tobacco comments:    smoked cigars x 10 yrs,  never smoked cigarettes  Vaping Use   Vaping status: Never Used  Substance and Sexual Activity   Alcohol use: No    Alcohol/week: 0.0 standard drinks of alcohol    Comment: drank some when I was young; quit in the 1990s   Drug use: No   Sexual activity: Not Currently  Other Topics Concern   Not on file  Social History Narrative   Not on file   Social Drivers of Health   Financial Resource Strain: Low Risk  (09/14/2023)   Overall Financial Resource Strain (CARDIA)  Difficulty of Paying Living Expenses: Not very hard  Food Insecurity: No Food Insecurity (09/13/2023)   Hunger Vital Sign    Worried About Running Out of Food in the Last Year: Never true    Ran Out of Food in the Last Year: Never true  Transportation Needs: No Transportation Needs (09/13/2023)   PRAPARE - Administrator, Civil Service (Medical): No    Lack of Transportation (Non-Medical): No  Physical Activity: Not on file  Stress: Not on file  Social Connections: Socially Integrated (09/13/2023)   Social Connection and Isolation Panel    Frequency of Communication with Friends and Family: More than three times a week    Frequency of Social Gatherings with Friends and Family: More than three times a week    Attends Religious Services: More than 4 times per year    Active Member of [employer] or Organizations: Yes    Attends Engineer, structural: More than 4 times per year    Marital Status: Married  Catering manager Violence: Not At Risk (09/13/2023)   Humiliation, Afraid, Rape, and Kick questionnaire    Fear of Current or Ex-Partner: No    Emotionally Abused: No    Physically Abused: No    Sexually Abused: No      Family History  Problem Relation Age of Onset   Heart disease Father    Heart attack Father    Hypertension Mother    Stroke Mother     Vitals:   10/07/23 1515  BP: 130/76  Pulse: (!) 53  SpO2: 99%  Weight: 79.1 kg (174 lb 6.4 oz)  Height: 5' 10 (1.778 m)    PHYSICAL  EXAM: General:  elderly appearing.   Neck: JVD ~10 cm.  Cor: Regular rate & irregular rhythm. No murmurs. Lungs: clear Extremities: +2-3 BLE edema to knee Neuro: alert & oriented x 3, slightly forgetful. Affect pleasant.   Wt Readings from Last 3 Encounters:  10/07/23 79.1 kg (174 lb 6.4 oz)  09/16/23 72.9 kg (160 lb 11.5 oz)  06/17/23 82.4 kg (181 lb 9.6 oz)     ECG: a fib with PVCs 50s (Personally reviewed)     ASSESSMENT & PLAN: HFmrEF, iCM -EF 55-60% in 02/22 -Echo 11/24 EF 45%, severe asymmetric LVH, septal flattening, RV mildly reduced, severe BAE. -Echo 07/25 LVEF 35-40%, septal flattening, RV enlarged and mildly HK, RVSP 50 mmHg - NYHA IIIa - Volume overloaded on exam.  - Increase lasix  40>60 mg daily. Keep KDUR where its at as we are starting spiro today.  - Restart Jardiance  10 mg daily. Had stopped it 3 days ago as he read that it caused breathing problems. Reeducated that Jardiance  does not cause breathing issues (unless its associated with allergic reaction), agreed to restart.  - Start spiro 12.5 mg daily - BMET/BNP today. Will need repeat labs at f/u,  - Encouraged to wear compression socks, he has some at home.   2. Permanent atrial fibrillation Bradycardia - Off AC with GIB - Continue ASA - SB on EKG today, stable  3. PVCs - 8/24: Cardiac monitor showed permanent A. Fib with average HR 41, PVC burden 21.6%  - a fib with PVCs on EKG today   4. CAD -S/p CABG 1999 -S/p PCI/DES to SVG to OM in 2017 and 2022. Has known occlusion of SVG to PDA and SVG to diagonal. LIMA to LAD patent on last cath. -NSTEMI 11/24 and 07/25 in setting of GI bleed. Refused cardiac cath.  -  Decrease Imdur  30 mg BID > 15 mg daily (suspect this was the cause of dizziness) - Continue ASA daily - Denies CP   Referred to HFSW (PCP, Medications, Transportation, ETOH Abuse, Drug Abuse, Insurance, Financial ): No Refer to Pharmacy: No Refer to Home Health: No Refer to Advanced [facility]  Failure Clinic: No  Refer to General Cardiology: Patient of Dr. Debera  Follow up in 1 week to reassess volume. May benefit from ReDS clip. Needs BMET/BNP.

## 2023-10-06 ENCOUNTER — Telehealth (HOSPITAL_COMMUNITY): Payer: Self-pay

## 2023-10-06 NOTE — Telephone Encounter (Signed)
 Called to confirm/remind patient of their appointment at the [facility] on 10/07/2023 2:45.   Appointment:   [] Confirmed  [x] Left mess   [] No answer/No voice mail  [] VM Full/unable to leave message  [] Phone not in service  Patient reminded to bring all medications and/or complete list.  Confirmed patient has transportation. Gave directions, instructed to utilize valet parking.

## 2023-10-07 ENCOUNTER — Encounter (HOSPITAL_COMMUNITY): Payer: Self-pay

## 2023-10-07 ENCOUNTER — Ambulatory Visit (HOSPITAL_COMMUNITY)
Admission: RE | Admit: 2023-10-07 | Discharge: 2023-10-07 | Disposition: A | Discharge status: Home / Self Care | Source: Ambulatory Visit | Attending: Internal Medicine | Admitting: Internal Medicine

## 2023-10-07 VITALS — BP 130/76 | HR 53 | Ht 70.0 in | Wt 174.4 lb

## 2023-10-07 DIAGNOSIS — Z7982 Long term (current) use of aspirin: Secondary | ICD-10-CM | POA: Diagnosis not present

## 2023-10-07 DIAGNOSIS — Z79899 Other long term (current) drug therapy: Secondary | ICD-10-CM | POA: Diagnosis not present

## 2023-10-07 DIAGNOSIS — I4821 Permanent atrial fibrillation: Secondary | ICD-10-CM | POA: Insufficient documentation

## 2023-10-07 DIAGNOSIS — Z955 Presence of coronary angioplasty implant and graft: Secondary | ICD-10-CM | POA: Diagnosis not present

## 2023-10-07 DIAGNOSIS — I255 Ischemic cardiomyopathy: Secondary | ICD-10-CM | POA: Diagnosis not present

## 2023-10-07 DIAGNOSIS — Z8249 Family history of ischemic heart disease and other diseases of the circulatory system: Secondary | ICD-10-CM | POA: Insufficient documentation

## 2023-10-07 DIAGNOSIS — M7989 Other specified soft tissue disorders: Secondary | ICD-10-CM | POA: Diagnosis not present

## 2023-10-07 DIAGNOSIS — R001 Bradycardia, unspecified: Secondary | ICD-10-CM | POA: Diagnosis not present

## 2023-10-07 DIAGNOSIS — I25810 Atherosclerosis of coronary artery bypass graft(s) without angina pectoris: Secondary | ICD-10-CM | POA: Diagnosis not present

## 2023-10-07 DIAGNOSIS — I252 Old myocardial infarction: Secondary | ICD-10-CM | POA: Insufficient documentation

## 2023-10-07 DIAGNOSIS — Z8673 Personal history of transient ischemic attack (TIA), and cerebral infarction without residual deficits: Secondary | ICD-10-CM | POA: Insufficient documentation

## 2023-10-07 DIAGNOSIS — I2510 Atherosclerotic heart disease of native coronary artery without angina pectoris: Secondary | ICD-10-CM | POA: Diagnosis not present

## 2023-10-07 DIAGNOSIS — I110 Hypertensive heart disease with heart failure: Secondary | ICD-10-CM | POA: Insufficient documentation

## 2023-10-07 DIAGNOSIS — Z7984 Long term (current) use of oral hypoglycemic drugs: Secondary | ICD-10-CM | POA: Diagnosis not present

## 2023-10-07 DIAGNOSIS — I5022 Chronic systolic (congestive) heart failure: Secondary | ICD-10-CM | POA: Diagnosis not present

## 2023-10-07 DIAGNOSIS — Z951 Presence of aortocoronary bypass graft: Secondary | ICD-10-CM | POA: Diagnosis not present

## 2023-10-07 DIAGNOSIS — I493 Ventricular premature depolarization: Secondary | ICD-10-CM | POA: Diagnosis not present

## 2023-10-07 MED ORDER — EMPAGLIFLOZIN 10 MG PO TABS: 10.0000 mg | ORAL_TABLET | Freq: Every day | ORAL | 5 refills | Status: DC

## 2023-10-07 MED ORDER — FUROSEMIDE 40 MG PO TABS: 60.0000 mg | ORAL_TABLET | Freq: Every day | ORAL | 5 refills | Status: DC

## 2023-10-07 MED ORDER — FUROSEMIDE 20 MG PO TABS
60.0000 mg | ORAL_TABLET | Freq: Every day | ORAL | 3 refills | Status: DC
Start: 2023-10-07 — End: 2023-11-12

## 2023-10-07 MED ORDER — SPIRONOLACTONE 25 MG PO TABS: 12.5000 mg | ORAL_TABLET | Freq: Every day | ORAL | 3 refills | Status: DC

## 2023-10-07 MED ORDER — ISOSORBIDE MONONITRATE ER 30 MG PO TB24: 15.0000 mg | ORAL_TABLET | Freq: Every evening | ORAL | 3 refills | Status: DC

## 2023-10-07 NOTE — Patient Instructions (Signed)
 Medication Changes:  DECREASE ISOSORBIDE  TO 15MG  ONCE DAILY   RESTART JARDIANCE  10MG  ONCE DAILY   INCREASE FUROSEMIDE  TO 60MG  ONCE DAILY   START SPIRONOLACTONE  12.5MG  ONCE DAILY   Lab Work:  Labs done today, your results will be available in MyChart, we will contact you for abnormal readings.  Special Instructions // Education:  PLEASE BUY COMPRESSION STOCKINGS   Follow-Up in: NEXT WEEK AS SCHEDULED   At the [facility], you and your health needs are our priority. We have a designated team specialized in the treatment of Heart Failure. This Care Team includes your primary Heart Failure Specialized Cardiologist (physician), Advanced Practice Providers (APPs- Physician Assistants and Nurse Practitioners), and Pharmacist who all work together to provide you with the care you need, when you need it.   You may see any of the following providers on your designated Care Team at your next follow up:  Dr. Toribio Fuel Dr. Ezra Shuck Dr. Ria Commander Dr. Odis Brownie Greig Mosses, NP [place]affie Shed, [place] [facility] [place], [place] Beckey Coe, NP [place] Lee, NP Tinnie Redman, PharmD   Please be sure to bring in all your medications bottles to every appointment.   Need to Contact Us :  If you have any questions or concerns before your next appointment please send us  a message through [place] or call our office at [phone].    TO LEAVE A MESSAGE FOR THE NURSE SELECT OPTION 2, PLEASE LEAVE A MESSAGE INCLUDING: YOUR NAME DATE OF BIRTH CALL BACK NUMBER REASON FOR CALL**this is important as we prioritize the call backs  YOU WILL RECEIVE A CALL BACK THE SAME DAY AS LONG AS YOU CALL BEFORE 4:00 PM

## 2023-10-08 ENCOUNTER — Ambulatory Visit (HOSPITAL_COMMUNITY): Payer: Self-pay | Admitting: Internal Medicine

## 2023-10-08 LAB — BASIC METABOLIC PANEL WITH GFR
Anion gap: 15 (ref 5–15)
BUN: 17 mg/dL (ref 8–23)
CO2: 24 mmol/L (ref 22–32)
Calcium: 9.4 mg/dL (ref 8.9–10.3)
Chloride: 103 mmol/L (ref 98–111)
Creatinine, Ser: 1.14 mg/dL (ref 0.61–1.24)
GFR, Estimated: >60 mL/min (ref >60)
Glucose, Bld: 39 mg/dL — CL (ref 70–99)
Potassium: 4.9 mmol/L (ref 3.5–5.1)
Sodium: 142 mmol/L (ref 135–145)

## 2023-10-08 LAB — BRAIN NATRIURETIC PEPTIDE: B Natriuretic Peptide: 897.1 pg/mL — ABNORMAL HIGH (ref 0.0–100.0)

## 2023-10-11 ENCOUNTER — Telehealth (HOSPITAL_COMMUNITY): Payer: Self-pay

## 2023-10-11 NOTE — Telephone Encounter (Signed)
 Called to confirm/remind patient of their appointment at the [facility] on 10/12/23 1:30.   Appointment:   [] Confirmed  [x] Left mess   [] No answer/No voice mail  [] VM Full/unable to leave message  [] Phone not in service  Patient reminded to bring all medications and/or complete list.  Confirmed patient has transportation. Gave directions, instructed to utilize valet parking.

## 2023-10-12 ENCOUNTER — Ambulatory Visit (HOSPITAL_COMMUNITY)
Admission: RE | Admit: 2023-10-12 | Discharge: 2023-10-12 | Disposition: A | Discharge status: Home / Self Care | Source: Ambulatory Visit | Attending: Cardiology | Admitting: Cardiology

## 2023-10-12 ENCOUNTER — Ambulatory Visit (HOSPITAL_COMMUNITY): Payer: Self-pay | Admitting: Cardiology

## 2023-10-12 ENCOUNTER — Encounter (HOSPITAL_COMMUNITY): Payer: Self-pay

## 2023-10-12 VITALS — BP 120/48 | HR 53 | Ht 70.0 in | Wt 164.6 lb

## 2023-10-12 DIAGNOSIS — R0601 Orthopnea: Secondary | ICD-10-CM | POA: Diagnosis not present

## 2023-10-12 DIAGNOSIS — I493 Ventricular premature depolarization: Secondary | ICD-10-CM | POA: Insufficient documentation

## 2023-10-12 DIAGNOSIS — I4821 Permanent atrial fibrillation: Secondary | ICD-10-CM | POA: Diagnosis not present

## 2023-10-12 DIAGNOSIS — I255 Ischemic cardiomyopathy: Secondary | ICD-10-CM | POA: Insufficient documentation

## 2023-10-12 DIAGNOSIS — Z8673 Personal history of transient ischemic attack (TIA), and cerebral infarction without residual deficits: Secondary | ICD-10-CM | POA: Diagnosis not present

## 2023-10-12 DIAGNOSIS — R001 Bradycardia, unspecified: Secondary | ICD-10-CM | POA: Diagnosis not present

## 2023-10-12 DIAGNOSIS — I252 Old myocardial infarction: Secondary | ICD-10-CM | POA: Diagnosis not present

## 2023-10-12 DIAGNOSIS — Z7984 Long term (current) use of oral hypoglycemic drugs: Secondary | ICD-10-CM | POA: Diagnosis not present

## 2023-10-12 DIAGNOSIS — I2510 Atherosclerotic heart disease of native coronary artery without angina pectoris: Secondary | ICD-10-CM | POA: Insufficient documentation

## 2023-10-12 DIAGNOSIS — Z951 Presence of aortocoronary bypass graft: Secondary | ICD-10-CM | POA: Diagnosis not present

## 2023-10-12 DIAGNOSIS — Z955 Presence of coronary angioplasty implant and graft: Secondary | ICD-10-CM | POA: Insufficient documentation

## 2023-10-12 DIAGNOSIS — I482 Chronic atrial fibrillation, unspecified: Secondary | ICD-10-CM | POA: Diagnosis not present

## 2023-10-12 DIAGNOSIS — I5022 Chronic systolic (congestive) heart failure: Secondary | ICD-10-CM | POA: Insufficient documentation

## 2023-10-12 DIAGNOSIS — Z7982 Long term (current) use of aspirin: Secondary | ICD-10-CM | POA: Insufficient documentation

## 2023-10-12 DIAGNOSIS — I110 Hypertensive heart disease with heart failure: Secondary | ICD-10-CM | POA: Insufficient documentation

## 2023-10-12 DIAGNOSIS — Z79899 Other long term (current) drug therapy: Secondary | ICD-10-CM | POA: Diagnosis not present

## 2023-10-12 LAB — BASIC METABOLIC PANEL WITH GFR
Anion gap: 10 (ref 5–15)
BUN: 22 mg/dL (ref 8–23)
CO2: 29 mmol/L (ref 22–32)
Calcium: 9.2 mg/dL (ref 8.9–10.3)
Chloride: 101 mmol/L (ref 98–111)
Creatinine, Ser: 1.48 mg/dL — ABNORMAL HIGH (ref 0.61–1.24)
GFR, Estimated: 47 mL/min — ABNORMAL LOW (ref >60)
Glucose, Bld: 120 mg/dL — ABNORMAL HIGH (ref 70–99)
Potassium: 5 mmol/L (ref 3.5–5.1)
Sodium: 140 mmol/L (ref 135–145)

## 2023-10-12 LAB — BRAIN NATRIURETIC PEPTIDE: B Natriuretic Peptide: 842.4 pg/mL — ABNORMAL HIGH (ref 0.0–100.0)

## 2023-10-12 MED ORDER — SPIRONOLACTONE 25 MG PO TABS: 25.0000 mg | ORAL_TABLET | Freq: Every day | ORAL | 5 refills | Status: DC

## 2023-10-12 MED ORDER — MEXILETINE HCL 200 MG PO CAPS: 200.0000 mg | ORAL_CAPSULE | Freq: Two times a day (BID) | ORAL | 3 refills | Status: DC

## 2023-10-12 NOTE — Progress Notes (Signed)
 ReDS Vest / Clip - 10/12/23 1410       ReDS Vest / Clip   Station Marker C    Ruler Value 26    ReDS Value Range Low volume    ReDS Actual Value 35

## 2023-10-12 NOTE — Patient Instructions (Signed)
 Medication Changes:  START MEXILETINE 200MG  TWICE DAILY   INCREASE SPIRONOLACTONE  25MG  ONCE DAILY    Lab Work:  Labs done today, your results will be available in MyChart, we will contact you for abnormal readings.  Follow-Up in: IN 1 MONTH AS SCHEDULED WITH TOC CLINIC  At the [facility], you and your health needs are our priority. We have a designated team specialized in the treatment of Heart Failure. This Care Team includes your primary Heart Failure Specialized Cardiologist (physician), Advanced Practice Providers (APPs- Physician Assistants and Nurse Practitioners), and Pharmacist who all work together to provide you with the care you need, when you need it.   You may see any of the following providers on your designated Care Team at your next follow up:  Dr. Toribio Fuel Dr. Ezra Shuck Dr. Ria Commander Dr. Odis Brownie Greig Mosses, NP [place] Shed, [place] [facility] [place], [place] Beckey Coe, NP [place] Lee, NP Tinnie Redman, PharmD   Please be sure to bring in all your medications bottles to every appointment.   Need to Contact Us :  If you have any questions or concerns before your next appointment please send us  a message through [place] or call our office at [phone].    TO LEAVE A MESSAGE FOR THE NURSE SELECT OPTION 2, PLEASE LEAVE A MESSAGE INCLUDING: YOUR NAME DATE OF BIRTH CALL BACK NUMBER REASON FOR CALL**this is important as we prioritize the call backs  YOU WILL RECEIVE A CALL BACK THE SAME DAY AS LONG AS YOU CALL BEFORE 4:00 PM

## 2023-10-12 NOTE — Progress Notes (Signed)
 HEART & VASCULAR TRANSITION OF CARE PROGRESS NOTE     Referring Physician: Dr. Darci PCP: [name], MD  Cardiologist: Dr. Debera  Chief Complaint: f/u for systolic heart failure   HPI: Referred to clinic by Dr. Darci for heart failure consultation.   BARNIE SOPKO is a 83 y.o. 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  8/24: Cardiac monitor showed permanent A. Fib with average HR 41, PVC burden 21.6%   He was admitted 11/24 with NSTEMI. Hs troponin > 5000 and had ECG changes. Aggressive management limited by GI bleed requiring multiple transfusions. GI saw patient and felt bleeding most likely 2/2 diverticular bleed. Cardiac cath discussed with patient but medical therapy ultimately decided. Echo with EF 45%, severe asymmetric LVH, septal flattening, RV mildly reduced, severe BAE. He was also noted to have bradycardia with rates in the 40s and frequent PVCs.  He was readmitted in July 2025 with chest pain and bright red stools X 10 days. HS troponin elevated to 1400. Felt to be 2/2 demand ischemia in setting of anemia/GI bleed. Echo during admit with LVEF 35-40%, septal flattening, RV enlarged and mildly HK, RVSP 50 mmHg. He was volume overloaded and required IV diuresis in addition to transfusions. GDMT limited by BP. 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 He did not undergo endoscopy.  He was seen in [facility] clinic last wk and was volume overloaded. Pt had self discontinued Jardiance  due to concern for potential side effects that he had heard about on a commercial but he was agreeable to restart. Jardiance  was restarted. Lasix  increased from 40>>60 mg daily. Spironolactone  also started at 12.5 mg daily.  He presents back again today to reassess volume status. Here w/ his son. Doing ok. Wt down 10 lb since last visit but c/w orthopnea at  night and LEE, though also w/ chronic venous insuffiencey. His son just purchased compression stockings but he is not wearing them today. ReDs 35%, upper limits of normal.   EKG shows chronic Afib w/ frequent PVCs, 61 bpm.   BP 120/48. He c/w some occasional positional dizzness but no syncope/ near syncope. He denies CP.    Denies ETOH, tobacco or drug use. Dad passed away from MI, mom passed away from stroke. Sister with HTN. No cardiac history in children. His son, him and his wife just moved close together on 10 acres of land. His son plans on going on walks with him to keep him active  Zio 8/24 ZIO monitor reviewed.  6 days, 3 hours analyzed.   Predominant rhythm is atrial fibrillation with heart rate ranging from 30 bpm up to 75 bpm and average heart rate 41 bpm.  In the right clinical context, could be consistent with chronotropic incompetence and symptomatic bradycardia. There were frequent PVCs representing 21.6% total beats.  Otherwise rare ventricular couplets and triplets were noted as well as limited episodes of ventricular bigeminy and trigeminy. No pauses.     Past Medical History:  Diagnosis Date   Acute lower GI bleeding 2008   Anxiety    Asthma    Carotid artery disease (HCC)    Asymptomatic left carotid bruit   COPD (chronic obstructive pulmonary disease) (HCC)    Coronary artery disease    a. CABG - 1999 with LIMA-LAD, SVG-DIAG, SVG-OM, SVG-RPDA  b. Cath in  setting of NSTEMI 03/18/2015: patent LIMA-LAD, occluded SVG-RCA, occluded SVG-D1, 99% stenosis of SVG-OM treated w/ DES   Depression    Dyslipidemia    Erectile dysfunction    Essential hypertension    History of blood transfusion 2008   History of stroke    Hyperlipidemia    Myocardial infarction [facility])     Current Outpatient Medications  Medication Sig Dispense Refill   acetaminophen  (TYLENOL ) 500 MG tablet Take 1,000 mg by mouth 2 (two) times daily as needed for moderate pain (pain score 4-6), fever or  headache.     albuterol  (VENTOLIN  HFA) 108 (90 Base) MCG/ACT inhaler Inhale 2 puffs into the lungs every 6 (six) hours as needed for wheezing or shortness of breath.     ALPRAZolam  (XANAX ) 1 MG tablet Take 1 tablet (1 mg total) by mouth at bedtime as needed for anxiety. 10 tablet 0   Ascorbic Acid  (VITAMIN C  PO) Take 1 tablet by mouth daily with breakfast.     aspirin  EC 81 MG tablet Take 1 tablet (81 mg total) by mouth daily. 90 tablet 3   B Complex-C (SUPER B COMPLEX PO) Take 1 capsule by mouth daily with breakfast.     CHELATED ZINC  PO Take 1 tablet by mouth daily with breakfast.     empagliflozin  (JARDIANCE ) 10 MG TABS tablet Take 1 tablet (10 mg total) by mouth daily. 30 tablet 5   Ferrous Sulfate (IRON PO) Take 1 tablet by mouth daily with breakfast.     furosemide  (LASIX ) 20 MG tablet Take 3 tablets (60 mg total) by mouth daily. 90 tablet 3   ipratropium-albuterol  (DUONEB) 0.5-2.5 (3) MG/3ML SOLN Take 3 mLs by nebulization every 4 (four) hours as needed (wheezing, shortness of breath).     isosorbide  mononitrate (IMDUR ) 30 MG 24 hr tablet Take 0.5 tablets (15 mg total) by mouth at bedtime. 45 tablet 3   Multiple Vitamins-Minerals (MENS 50+ MULTIVITAMIN) TABS Take 1 tablet by mouth daily with breakfast.     NEXLIZET 180-10 MG TABS Take 1 tablet by mouth daily.     nitroGLYCERIN  (NITROSTAT ) 0.4 MG SL tablet DISSOLVE 1 TABLET UNDER THE TONGUE EVERY 5 MINUTES AS NEEDED FOR CHEST PAIN. DO NOT EXCEED 3 DOSES IN 15 MIN. CALL 911 IF NO RELIEF 25 tablet 3   Omega-3 Fatty Acids (FISH OIL PO) Take 1 capsule by mouth daily with breakfast.     pantoprazole  (PROTONIX ) 40 MG tablet TAKE 1 TABLET BY MOUTH EVERY DAY 90 tablet 1   potassium chloride  SA (KLOR-CON  M) 20 MEQ tablet Take 2 tablets (40 mEq total) by mouth daily.     spironolactone  (ALDACTONE ) 25 MG tablet Take 0.5 tablets (12.5 mg total) by mouth daily. 45 tablet 3   Tiotropium Bromide -Olodaterol (STIOLTO RESPIMAT ) 2.5-2.5 MCG/ACT AERS Inhale 2  puffs into the lungs daily.     No current facility-administered medications for this encounter.    Allergies  Allergen Reactions   Anoro Ellipta  [Umeclidinium-Vilanterol] Other (See Comments)    Cardiac problems   Lipitor [Atorvastatin Calcium] Other (See Comments)    Unknown reaction   Niaspan [Niacin] Other (See Comments)    Unknown reaction   Tricor [Fenofibrate] Other (See Comments)    Fatigue    Zocor [Simvastatin] Other (See Comments)    Unknown reaction      Social History   Socioeconomic History   Marital status: Married    Spouse name: Not on file   Number of children: Not on file  Years of education: Not on file   Highest education level: Not on file  Occupational History   Occupation: Welder x 10 yrs   Tobacco Use   Smoking status: Former    Types: Cigars    Quit date: 03/02/1997    Years since quitting: 26.6    Passive exposure: Never   Smokeless tobacco: Never   Tobacco comments:    smoked cigars x 10 yrs, never smoked cigarettes  Vaping Use   Vaping status: Never Used  Substance and Sexual Activity   Alcohol use: No    Alcohol/week: 0.0 standard drinks of alcohol    Comment: drank some when I was young; quit in the 1990s   Drug use: No   Sexual activity: Not Currently  Other Topics Concern   Not on file  Social History Narrative   Not on file   Social Drivers of Health   Financial Resource Strain: Low Risk  (09/14/2023)   Overall Financial Resource Strain (CARDIA)    Difficulty of Paying Living Expenses: Not very hard  Food Insecurity: No Food Insecurity (09/13/2023)   Hunger Vital Sign    Worried About Running Out of Food in the Last Year: Never true    Ran Out of Food in the Last Year: Never true  Transportation Needs: No Transportation Needs (09/13/2023)   PRAPARE - Administrator, Civil Service (Medical): No    Lack of Transportation (Non-Medical): No  Physical Activity: Not on file  Stress: Not on file  Social  Connections: Socially Integrated (09/13/2023)   Social Connection and Isolation Panel    Frequency of Communication with Friends and Family: More than three times a week    Frequency of Social Gatherings with Friends and Family: More than three times a week    Attends Religious Services: More than 4 times per year    Active Member of [employer] or Organizations: Yes    Attends Engineer, structural: More than 4 times per year    Marital Status: Married  Catering manager Violence: Not At Risk (09/13/2023)   Humiliation, Afraid, Rape, and Kick questionnaire    Fear of Current or Ex-Partner: No    Emotionally Abused: No    Physically Abused: No    Sexually Abused: No      Family History  Problem Relation Age of Onset   Heart disease Father    Heart attack Father    Hypertension Mother    Stroke Mother     Vitals:   10/12/23 1410  BP: (!) 120/48  Pulse: (!) 53  SpO2: 98%  Weight: 74.7 kg (164 lb 9.6 oz)  Height: 5' 10 (1.778 m)   Wt Readings from Last 3 Encounters:  10/12/23 74.7 kg (164 lb 9.6 oz)  10/07/23 79.1 kg (174 lb 6.4 oz)  09/16/23 72.9 kg (160 lb 11.5 oz)    Physical Exam  ReDs 35%, upper limits of normal  GENERAL: elderly male, walked in to clinic w/o assistance. NAD Lungs- decreased BS at the bases bilaterally  CARDIAC:  JVP: 5-6 cm cm          Normal irreguarlly irregular, 1-2+ b/l ankle edema ABDOMEN: Soft, non-tender, non-distended.  EXTREMITIES: Warm and well perfused.  NEUROLOGIC: No obvious FND    ECG: permanent Afib w/ frequent PVCs 61 bpm (Personally reviewed)      ASSESSMENT & PLAN: HFmrEF, iCM +/- PVC mediated  - EF 55-60% in 02/22 - Progressive decline in EF since 2024, in  the setting of high PVC burden (22% by [facility])  - Echo 11/24 EF 45%, severe asymmetric LVH, septal flattening, RV mildly reduced, severe BAE. - Echo 07/25 LVEF 35-40%, septal flattening, RV enlarged and mildly HK, RVSP 50 mmHg - NYHA II-III, Volume status improving w/  wt down 10 lb but c/w mild volume overload on exam w/ LEE. ReDs 35%    - Increase Spironolactone  to 25 mg daily  - Continue Lasix  60 mg daily  - Continue Jardiance  10 mg daily   - Check BMP and BNP today   - Instructed to wear compression stockings   2. Permanent atrial fibrillation Bradycardia - rate controlled. Off beta blocker due to bradycardia  - Off AC with GIB  3. PVCs - 8/24: Cardiac monitor showed permanent A. Fib with average HR 41, PVC burden 21.6%  - frequent PVCs noted on today's EKG  - suspect contributing to CM - would benefit from suppression. Will avoid amio and ? blocker due to h/o symptomatic bradycardia - recommend mexiletine 200 mg bid   4. CAD - S/p CABG 1999 - S/p PCI/DES to SVG to OM in 2017 and 2022. Has known occlusion of SVG to PDA and SVG to diagonal. LIMA to LAD patent on last cath. - NSTEMI 11/24 and 07/25 in setting of GI bleed. Refused cardiac cath.  - denies CP  - Continue Imdur  15 mg daily. Did not tolerate higher doses due to dizziness  - Continue ASA 81 mg daily   Referred to HFSW (PCP, Medications, Transportation, ETOH Abuse, Drug Abuse, Insurance, Financial ): No Refer to Pharmacy: No Refer to Home Health: No Refer to Advanced Heart Failure Clinic: No  Refer to General Cardiology: Patient of Dr. Debera  F/u in 4 wks in [facility] clinic for final visit to assess response to mexiletine and reassess volume status. Keep f/u w/ cardiology in [place] in 2 months   Ndeye Tenorio Marcine, [place]  10/12/2023

## 2023-10-14 MED ORDER — SPIRONOLACTONE 25 MG PO TABS: 25.0000 mg | ORAL_TABLET | Freq: Every day | ORAL | 5 refills | Status: DC

## 2023-10-18 ENCOUNTER — Telehealth (HOSPITAL_COMMUNITY): Payer: Self-pay | Admitting: Cardiology

## 2023-10-18 NOTE — Telephone Encounter (Signed)
 Patients son called for clarification, reports he was told to get cough med OTC without DM at last OV.  Reports he nor the [facility] pharmacist was unable to find cough suppressant without DM    Attempted to review HF OTC MED LIST with son however he would like provider to clarify   Please clarify

## 2023-10-18 NOTE — Telephone Encounter (Signed)
[name] aware and voiced understanding

## 2023-10-25 ENCOUNTER — Telehealth (HOSPITAL_COMMUNITY): Payer: Self-pay | Admitting: Cardiology

## 2023-10-25 NOTE — Telephone Encounter (Signed)
Patients son aware and voiced understanding

## 2023-10-25 NOTE — Telephone Encounter (Signed)
 Patients son would like to know if its ok to use OTC vitamin D  from a cardiac standpoint.   Reports he wanted to be sure supplement wouldn't cause any harm  Reports they have 500 I.U and 1000 I.U -how often can he take   Please advise

## 2023-10-25 NOTE — Telephone Encounter (Signed)
 Usually okay from cardiac perspective but would review dosing with PCP. They may want to test vitamin d  level before adding supplement

## 2023-11-05 ENCOUNTER — Ambulatory Visit: Admitting: Gastroenterology

## 2023-11-10 ENCOUNTER — Ambulatory Visit: Admitting: Gastroenterology

## 2023-11-12 ENCOUNTER — Ambulatory Visit (HOSPITAL_COMMUNITY)
Admission: RE | Admit: 2023-11-12 | Discharge: 2023-11-12 | Disposition: A | Discharge status: Home / Self Care | Source: Ambulatory Visit | Attending: Cardiology | Admitting: Cardiology

## 2023-11-12 ENCOUNTER — Other Ambulatory Visit (HOSPITAL_COMMUNITY): Payer: Self-pay

## 2023-11-12 VITALS — BP 140/70 | HR 60 | Ht 70.0 in | Wt 169.4 lb

## 2023-11-12 DIAGNOSIS — Z7982 Long term (current) use of aspirin: Secondary | ICD-10-CM | POA: Insufficient documentation

## 2023-11-12 DIAGNOSIS — Z8249 Family history of ischemic heart disease and other diseases of the circulatory system: Secondary | ICD-10-CM | POA: Insufficient documentation

## 2023-11-12 DIAGNOSIS — E8770 Fluid overload, unspecified: Secondary | ICD-10-CM | POA: Insufficient documentation

## 2023-11-12 DIAGNOSIS — I25810 Atherosclerosis of coronary artery bypass graft(s) without angina pectoris: Secondary | ICD-10-CM | POA: Insufficient documentation

## 2023-11-12 DIAGNOSIS — I110 Hypertensive heart disease with heart failure: Secondary | ICD-10-CM | POA: Diagnosis not present

## 2023-11-12 DIAGNOSIS — I4821 Permanent atrial fibrillation: Secondary | ICD-10-CM | POA: Diagnosis not present

## 2023-11-12 DIAGNOSIS — Z8673 Personal history of transient ischemic attack (TIA), and cerebral infarction without residual deficits: Secondary | ICD-10-CM | POA: Insufficient documentation

## 2023-11-12 DIAGNOSIS — Z955 Presence of coronary angioplasty implant and graft: Secondary | ICD-10-CM | POA: Insufficient documentation

## 2023-11-12 DIAGNOSIS — Z7984 Long term (current) use of oral hypoglycemic drugs: Secondary | ICD-10-CM | POA: Insufficient documentation

## 2023-11-12 DIAGNOSIS — Z79899 Other long term (current) drug therapy: Secondary | ICD-10-CM | POA: Diagnosis not present

## 2023-11-12 DIAGNOSIS — I5022 Chronic systolic (congestive) heart failure: Secondary | ICD-10-CM | POA: Diagnosis not present

## 2023-11-12 DIAGNOSIS — I252 Old myocardial infarction: Secondary | ICD-10-CM | POA: Diagnosis not present

## 2023-11-12 DIAGNOSIS — I493 Ventricular premature depolarization: Secondary | ICD-10-CM | POA: Insufficient documentation

## 2023-11-12 DIAGNOSIS — Z91128 Patient's intentional underdosing of medication regimen for other reason: Secondary | ICD-10-CM | POA: Insufficient documentation

## 2023-11-12 LAB — BASIC METABOLIC PANEL WITH GFR
Anion gap: 12 (ref 5–15)
BUN: 21 mg/dL (ref 8–23)
CO2: 26 mmol/L (ref 22–32)
Calcium: 9 mg/dL (ref 8.9–10.3)
Chloride: 100 mmol/L (ref 98–111)
Creatinine, Ser: 1.29 mg/dL — ABNORMAL HIGH (ref 0.61–1.24)
GFR, Estimated: 56 mL/min — ABNORMAL LOW (ref >60)
Glucose, Bld: 105 mg/dL — ABNORMAL HIGH (ref 70–99)
Potassium: 3.1 mmol/L — ABNORMAL LOW (ref 3.5–5.1)
Sodium: 138 mmol/L (ref 135–145)

## 2023-11-12 LAB — BRAIN NATRIURETIC PEPTIDE: B Natriuretic Peptide: 1145 pg/mL — ABNORMAL HIGH (ref 0.0–100.0)

## 2023-11-12 MED ORDER — FUROSEMIDE 20 MG PO TABS: 80.0000 mg | ORAL_TABLET | Freq: Every day | ORAL | 3 refills | Status: DC

## 2023-11-12 MED ORDER — LOSARTAN POTASSIUM 25 MG PO TABS: 12.5000 mg | ORAL_TABLET | Freq: Every day | ORAL | 3 refills | Status: DC

## 2023-11-12 NOTE — Progress Notes (Signed)
 HEART & VASCULAR TRANSITION OF CARE PROGRESS NOTE   Referring Physician: Dr. Darci PCP: [name], MD  Cardiologist: Dr. Debera  HPI: Referred to clinic by Dr. Darci for heart failure consultation.   William Elliott is a 83 y.o. 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  8/24: Cardiac monitor showed permanent A. Fib with average HR 41, PVC burden 21.6%   He was admitted 11/24 with NSTEMI. Hs troponin > 5000 and had ECG changes. Aggressive management limited by GI bleed requiring multiple transfusions. GI saw patient and felt bleeding most likely 2/2 diverticular bleed. Cardiac cath discussed with patient but medical therapy ultimately decided. Echo with EF 45%, severe asymmetric LVH, septal flattening, RV mildly reduced, severe BAE. He was also noted to have bradycardia with rates in the 40s and frequent PVCs.  He was readmitted in July 2025 with chest pain and bright red stools X 10 days. HS troponin elevated to 1400. Felt to be 2/2 demand ischemia in setting of anemia/GI bleed. Echo during admit with LVEF 35-40%, septal flattening, RV enlarged and mildly HK, RVSP 50 mmHg. He was volume overloaded and required IV diuresis in addition to transfusions. GDMT limited by BP. 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 He did not undergo endoscopy.  He was seen in [facility] clinic the last to visits with volume overloaded. Pt had self discontinued Jardiance  due to concern for potential side effects that he had heard about on a commercial but he was agreeable to restart. Jardiance  was restarted. Lasix  increased from 40>>60 mg daily. Spironolactone  also started at 12.5 mg daily.  Today he  presents for transition of care follow up visit with son to reassess volume. Overall feeling ok. NYHA III. Reports lower extremity edema, dyspnea and fatigue. Denies chest  pain, orthopnea, palpitations, dizziness, and abnormal bleeding. Able to perform ADLs. Appetite okay, reports that he does eat food with a lot of salt. Has scale and BP cuff at home, will start taking daily. Wearing compression socks. Has a sleep number bed at home, is going to start sleeping with legs slightly elevated. Reports good diuretic response to Lasix . Compliant with all medications. Denies ETOH, tobacco, or drug use.   Denies ETOH, tobacco or drug use. Dad passed away from MI, mom passed away from stroke. Sister with HTN. No cardiac history in children. His son, him, and his wife just moved close together on 10 acres of land. His son plans on going on walks with him to keep him active  Cardiac Studies: Zio 8/24 - 6 days, 3 hours analyzed.  Predominant rhythm is atrial fibrillation with heart rate ranging from 30 bpm up to 75 bpm and average heart rate 41 bpm.  In the right clinical context, could be consistent with chronotropic incompetence and symptomatic bradycardia. There were frequent PVCs representing 21.6% total beats.  Otherwise rare ventricular couplets and triplets were noted as well as limited episodes of ventricular bigeminy and trigeminy. No pauses.   Past Medical History:  Diagnosis Date   Acute lower GI bleeding 2008   Anxiety    Asthma    Carotid artery disease (HCC)    Asymptomatic left carotid bruit   COPD (chronic obstructive pulmonary disease) (HCC)    Coronary artery disease    a. CABG - 1999 with LIMA-LAD, SVG-DIAG, SVG-OM, SVG-RPDA  b.  Cath in setting of NSTEMI 03/18/2015: patent LIMA-LAD, occluded SVG-RCA, occluded SVG-D1, 99% stenosis of SVG-OM treated w/ DES   Depression    Dyslipidemia    Erectile dysfunction    Essential hypertension    History of blood transfusion 2008   History of stroke    Hyperlipidemia    Myocardial infarction [facility])     Current Outpatient Medications  Medication Sig Dispense Refill   acetaminophen  (TYLENOL ) 500 MG tablet Take  1,000 mg by mouth 2 (two) times daily as needed for moderate pain (pain score 4-6), fever or headache.     albuterol  (VENTOLIN  HFA) 108 (90 Base) MCG/ACT inhaler Inhale 2 puffs into the lungs every 6 (six) hours as needed for wheezing or shortness of breath.     ALPRAZolam  (XANAX ) 1 MG tablet Take 1 tablet (1 mg total) by mouth at bedtime as needed for anxiety. 10 tablet 0   Ascorbic Acid  (VITAMIN C  PO) Take 1 tablet by mouth daily with breakfast.     aspirin  EC 81 MG tablet Take 1 tablet (81 mg total) by mouth daily. 90 tablet 3   B Complex-C (SUPER B COMPLEX PO) Take 1 capsule by mouth daily with breakfast.     CHELATED ZINC  PO Take 1 tablet by mouth daily with breakfast.     empagliflozin  (JARDIANCE ) 10 MG TABS tablet Take 1 tablet (10 mg total) by mouth daily. 30 tablet 5   Ferrous Sulfate (IRON PO) Take 1 tablet by mouth daily with breakfast.     furosemide  (LASIX ) 20 MG tablet Take 3 tablets (60 mg total) by mouth daily. 90 tablet 3   ipratropium-albuterol  (DUONEB) 0.5-2.5 (3) MG/3ML SOLN Take 3 mLs by nebulization every 4 (four) hours as needed (wheezing, shortness of breath).     isosorbide  mononitrate (IMDUR ) 30 MG 24 hr tablet Take 0.5 tablets (15 mg total) by mouth at bedtime. 45 tablet 3   mexiletine (MEXITIL) 200 MG capsule Take 1 capsule (200 mg total) by mouth 2 (two) times daily. 60 capsule 3   Multiple Vitamins-Minerals (MENS 50+ MULTIVITAMIN) TABS Take 1 tablet by mouth daily with breakfast.     NEXLIZET 180-10 MG TABS Take 1 tablet by mouth daily.     nitroGLYCERIN  (NITROSTAT ) 0.4 MG SL tablet DISSOLVE 1 TABLET UNDER THE TONGUE EVERY 5 MINUTES AS NEEDED FOR CHEST PAIN. DO NOT EXCEED 3 DOSES IN 15 MIN. CALL 911 IF NO RELIEF 25 tablet 3   Omega-3 Fatty Acids (FISH OIL PO) Take 1 capsule by mouth daily with breakfast.     pantoprazole  (PROTONIX ) 40 MG tablet TAKE 1 TABLET BY MOUTH EVERY DAY 90 tablet 1   spironolactone  (ALDACTONE ) 25 MG tablet Take 1 tablet (25 mg total) by mouth  daily. 30 tablet 5   Tiotropium Bromide -Olodaterol (STIOLTO RESPIMAT ) 2.5-2.5 MCG/ACT AERS Inhale 2 puffs into the lungs daily.     potassium chloride  SA (KLOR-CON  M) 20 MEQ tablet Take 2 tablets (40 mEq total) by mouth daily. (Patient not taking: Reported on 11/12/2023)     No current facility-administered medications for this encounter.    Allergies  Allergen Reactions   Anoro Ellipta  [Umeclidinium-Vilanterol] Other (See Comments)    Cardiac problems   Lipitor [Atorvastatin Calcium] Other (See Comments)    Unknown reaction   Niaspan [Niacin] Other (See Comments)    Unknown reaction   Tricor [Fenofibrate] Other (See Comments)    Fatigue    Zocor [Simvastatin] Other (See Comments)    Unknown reaction  Social History   Socioeconomic History   Marital status: Married    Spouse name: Not on file   Number of children: Not on file   Years of education: Not on file   Highest education level: Not on file  Occupational History   Occupation: Welder x 10 yrs   Tobacco Use   Smoking status: Former    Types: Cigars    Quit date: 03/02/1997    Years since quitting: 26.7    Passive exposure: Never   Smokeless tobacco: Never   Tobacco comments:    smoked cigars x 10 yrs, never smoked cigarettes  Vaping Use   Vaping status: Never Used  Substance and Sexual Activity   Alcohol use: No    Alcohol/week: 0.0 standard drinks of alcohol    Comment: drank some when I was young; quit in the 1990s   Drug use: No   Sexual activity: Not Currently  Other Topics Concern   Not on file  Social History Narrative   Not on file   Social Drivers of Health   Financial Resource Strain: Low Risk  (09/14/2023)   Overall Financial Resource Strain (CARDIA)    Difficulty of Paying Living Expenses: Not very hard  Food Insecurity: No Food Insecurity (09/13/2023)   Hunger Vital Sign    Worried About Running Out of Food in the Last Year: Never true    Ran Out of Food in the Last Year: Never true   Transportation Needs: No Transportation Needs (09/13/2023)   PRAPARE - Administrator, Civil Service (Medical): No    Lack of Transportation (Non-Medical): No  Physical Activity: Not on file  Stress: Not on file  Social Connections: Socially Integrated (09/13/2023)   Social Connection and Isolation Panel    Frequency of Communication with Friends and Family: More than three times a week    Frequency of Social Gatherings with Friends and Family: More than three times a week    Attends Religious Services: More than 4 times per year    Active Member of [employer] or Organizations: Yes    Attends Banker Meetings: More than 4 times per year    Marital Status: Married  Catering manager Violence: Not At Risk (09/13/2023)   Humiliation, Afraid, Rape, and Kick questionnaire    Fear of Current or Ex-Partner: No    Emotionally Abused: No    Physically Abused: No    Sexually Abused: No      Family History  Problem Relation Age of Onset   Heart disease Father    Heart attack Father    Hypertension Mother    Stroke Mother     Vitals:   11/12/23 1347  BP: (!) 140/70  Pulse: 60  SpO2: 95%  Weight: 76.8 kg (169 lb 6.4 oz)  Height: 5' 10 (1.778 m)    Wt Readings from Last 3 Encounters:  10/12/23 74.7 kg (164 lb 9.6 oz)  10/07/23 79.1 kg (174 lb 6.4 oz)  09/16/23 72.9 kg (160 lb 11.5 oz)   Filed Weights   11/12/23 1347  Weight: 76.8 kg (169 lb 6.4 oz)    Physical Exam  General: Elderly appearing. No distress on RA Cardiac: JVP ~10cm. Irregular. S1 and S2 present. No murmurs  Abdomen: Soft, non-tender, non-distended.  Extremities: Warm and dry.  2+ BLE edema. Wearing compression socks Neuro: Alert and oriented x3. Affect pleasant.   ECG: Afib 54 bpm, occasional PVCs (personally reviewed)'  ASSESSMENT & PLAN: HFmrEF, iCM +/-  PVC mediated  - EF 55-60% in 02/22 - Progressive decline in EF since 2024, in the setting of high PVC burden (22% by Zio)  - Echo  11/24 EF 45%, severe asymmetric LVH, septal flattening, RV mildly reduced, severe BAE. - Echo 07/25 LVEF 35-40%, septal flattening, RV enlarged and mildly HK, RVSP 50 mmHg - NYHA II-III. Volume up on exam.  - Continue Spironolactone  to 25 mg daily  - Increase Lasix  to 80 mg daily, if fluid does not improve may need bid dosing (does not think he can do) - Continue Jardiance  10 mg daily   - Start losartan  12.5 mg daily - Wearing compression socks. Discussed cutting back fluid intake as well as limiting salt, reports that he eats a high salt diet.   2. Permanent atrial fibrillation Bradycardia - rate controlled. Off beta blocker due to bradycardia  - Off AC with GIB  3. PVCs - 8/24: Cardiac monitor showed permanent A. Fib with average HR 41, PVC burden 21.6%  - occasional PVCs noted on ECG - suspect contributing to CM - would benefit from suppression. Will avoid amio and ? blocker due to h/o symptomatic bradycardia - continue mexiletine 200 mg bid  - would place ZIO at outpatient visit to quantify  4. CAD - S/p CABG 1999 - S/p PCI/DES to SVG to OM in 2017 and 2022. Has known occlusion of SVG to PDA and SVG to diagonal. LIMA to LAD patent on last cath. - NSTEMI 11/24 and 7/25 in setting of GI bleed. Refused cardiac cath.  - Denies CP  - Continue Imdur  15 mg daily. Did not tolerate higher doses due to dizziness. - Continue ASA 81 mg daily   Referred to HFSW (PCP, Medications, Transportation, ETOH Abuse, Drug Abuse, Insurance, Financial ): No Refer to Pharmacy: No Refer to Home Health: No Refer to Advanced Heart Failure Clinic: No  Refer to General Cardiology: Patient of Dr. Debera  F/u with cardiology in [place] in 1 month  [place] Gibson Telleria, NP 11/12/2023

## 2023-11-12 NOTE — Patient Instructions (Addendum)
 Medication Changes:  START LOSARTAN  12.5MG  ONCE DAILY   INCREASE FUROSEMIDE  TO 80MG  ONCE DAILY   Lab Work:  Labs done today, your results will be available in MyChart, we will contact you for abnormal readings.  THEN PLEASE GO TO [facility] [place] FOR REPEAT LABS IN 1 WEEK (AROUND 9/19)  Special Instructions // Education:  DECREASE FLUID INTAKE   DECREASE SALT INTAKE   CHECK YOUR BLOOD PRESSURE AND WEIGHT ONCE DAILY AND WRITE THESE NUMBERS DOWN   Follow-Up in: WITH [facility] [place] AS SCHEDULED   At the [facility], you and your health needs are our priority. We have a designated team specialized in the treatment of Heart Failure. This Care Team includes your primary Heart Failure Specialized Cardiologist (physician), Advanced Practice Providers (APPs- Physician Assistants and Nurse Practitioners), and Pharmacist who all work together to provide you with the care you need, when you need it.   You may see any of the following providers on your designated Care Team at your next follow up:  Dr. Toribio Fuel Dr. Ezra Shuck Dr. Ria Commander Dr. Odis Brownie Greig Mosses, NP Caffie Shed, [place] [facility] [place], [place] Beckey Coe, NP [place] Lee, NP Tinnie Redman, PharmD   Please be sure to bring in all your medications bottles to every appointment.   Need to Contact Us :  If you have any questions or concerns before your next appointment please send us  a message through [place] or call our office at [phone].    TO LEAVE A MESSAGE FOR THE NURSE SELECT OPTION 2, PLEASE LEAVE A MESSAGE INCLUDING: YOUR NAME DATE OF BIRTH CALL BACK NUMBER REASON FOR CALL**this is important as we prioritize the call backs  YOU WILL RECEIVE A CALL BACK THE SAME DAY AS LONG AS YOU CALL BEFORE 4:00 PM

## 2023-11-15 ENCOUNTER — Ambulatory Visit (HOSPITAL_COMMUNITY): Payer: Self-pay | Admitting: Cardiology

## 2023-12-16 ENCOUNTER — Ambulatory Visit: Admitting: Nurse Practitioner

## 2023-12-16 NOTE — Progress Notes (Deleted)
 Cardiology Office Note:  .   Date:  06/17/2023 ID:  William Elliott, DOB 03/07/1940, MRN [id number] PCP: [name], MD  [facility] Providers Cardiologist:  Jayson Sierras, MD Cardiology APP:  Miriam Norris, NP    History of Present Illness: .   William Elliott is a 83 y.o. male with a PMH of CAD, s/p CABG and drug-eluting stent to SVG to OM in 2017, HFmrEF, permanent A-fib, bradycardia, mixed hyperlipidemia, history of lower GI bleed, dizziness, HTN, dyslipidemia, and past history of stroke, who presents today for follow-up.  History of CABG and drug-eluting stent to SVG to OM 2017, also received drug-eluting stent to SVG to OM in 2022 with occlusion of SVG to PDA and SVG to diagonal, LIMA to LAD was patent.    Last seen by Dr. Sierras on November 19, 2021.  He was doing well at the time, was working on weight loss.  Last seen on 02/12/2023. Was doing well. Admitted to some recent leg edema. Denied any chest pain, shortness of breath, palpitations, syncope, presyncope, dizziness, orthopnea, PND, significant weight changes, acute bleeding, or claudication.  03/25/2023 - He is here for telehealth visit as he is sick today and feeling under the weather. Has been ongoing for the last 6-7 days. Taking a breathing treatment every day. Says now he has more edema in his feet/legs, wants to know if he can go up on his fluid pills. Does admit to shortness of breath when laying down, sleeps at an incline, denies any extra pillow use. Weight is around 188-189 lbs at home. Denies any chest pain, shortness of breath, palpitations, syncope, presyncope, dizziness, PND, acute bleeding, or claudication.  06/17/2023 - Today he presents for follow-up. Says he is feeling great. Admits to good diuresis since now back on Lasix . Denies any chest pain, shortness of breath, palpitations, syncope, presyncope, dizziness, orthopnea, PND, swelling or significant weight changes, acute bleeding, or  claudication.   ROS: Negative. See HPI.   SH: Wife is William Elliott, another patient of mine, have been married for close to 60 years.   Studies Reviewed: SABRA     EKG: EKG is not ordered today.   Echo 01/2023:  1. Left ventricular ejection fraction, by estimation, is 45%. The left ventricle has mildly decreased function. Left ventricular endocardial border not optimally defined to evaluate regional wall motion. There is severe asymmetric left ventricular  hypertrophy of the lateral segment. Left ventricular diastolic function could not be evaluated. There is the interventricular septum is flattened in systole and diastole, consistent with right ventricular pressure and volume overload.   2. Right ventricular systolic function is mildly reduced. The right  ventricular size is mildly enlarged. There is moderately elevated  pulmonary artery systolic pressure. The estimated right ventricular systolic pressure is 48.4 mmHg.   3. Left atrial size was severely dilated.   4. Right atrial size was severely dilated.   5. The mitral valve is abnormal. Mild mitral valve regurgitation. No evidence of mitral stenosis. Moderate mitral annular calcification.   6. The aortic valve is tricuspid. Aortic valve regurgitation is not  visualized. Mild aortic valve stenosis. Aortic valve area, by VTI measures 1.55 cm. Aortic valve mean gradient measures 8.0 mmHg. Aortic valve Vmax measures 2.11 m/s.   7. The inferior vena cava is dilated in size with <50% respiratory  variability, suggesting right atrial pressure of 15 mmHg.   Comparison(s): Prior images reviewed side by side. Changes from prior study are noted. LVEF  worsened from normal to 45% now.  Carotid duplex 11/2022:  Summary:  Right Carotid: Velocities in the right ICA are consistent with a 1-39% stenosis. Non-hemodynamically significant plaque <50% noted in the CCA. The ECA appears <50% stenosed.   Left Carotid: Velocities in the left ICA are  consistent with a 1-39% stenosis. Non-hemodynamically significant plaque <50% noted in the CCA. The ECA appears <50% stenosed.   Vertebrals:  Bilateral vertebral arteries demonstrate antegrade flow. Subclavians: Normal flow hemodynamics were seen in bilateral subclavian arteries.  Cardiac monitor 10/2022:  ZIO monitor reviewed.  6 days, 3 hours analyzed.   Predominant rhythm is atrial fibrillation with heart rate ranging from 30 bpm up to 75 bpm and average heart rate 41 bpm.  In the right clinical context, could be consistent with chronotropic incompetence and symptomatic bradycardia. There were frequent PVCs representing 21.6% total beats.  Otherwise rare ventricular couplets and triplets were noted as well as limited episodes of ventricular bigeminy and trigeminy. No pauses.  LHC 04/2020:  A stent was successfully placed.   99% mid body stenosis in SVG to obtuse marginal reduced to 10% with TIMI grade III flow using a 4.0 x 22 Onyx deployed at 12 atm.  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 (The previously placed ostial to proximal saphenous vein to OM stent overhangs into the ascending aorta preventing engagement with extra-support guide catheters.  An internal mammary guide catheter was used to coaxially aim and advanced the guidewire into the graft). Chronic occlusion of SVG to PDA. Chronic occlusion of SVG to diagonal. Patent LIMA to the mid to distal LAD.  Supplies collaterals to the PDA. Total occlusion of the mid LAD.  95% ostial LAD stenosis with only septal perforator and a small diagonal at risk. Total occlusion of the mid circumflex Total occlusion of the mid RCA.  PDA is supplied by LAD collaterals when the LIMA graft to the LAD is visualized. Echocardiogram documents preserved LV systolic function, EF greater than 50%.  LVEDP is normal, 7 mmHg.   RECOMMENDATIONS:   Aspirin  and Plavix  for 12 months. If decision to use anticoagulation for  management of atrial fibrillation is made, would recommend discontinuing aspirin  after 1 month and continuing Plavix  to complete 12 months  Echo 04/2020:  1. Left ventricular ejection fraction, by estimation, is 55 to 60%. The  left ventricle has normal function. The left ventricle demonstrates  regional wall motion abnormalities (see scoring diagram/findings for  description). There is severe left  ventricular hypertrophy. Left ventricular diastolic function could not be  evaluated. There is moderate hypokinesis of the left ventricular,  basal-mid inferior wall.   2. Right ventricular systolic function is low normal. The right  ventricular size is normal. Tricuspid regurgitation signal is inadequate  for assessing PA pressure.   3. Left atrial size was severely dilated.   4. Right atrial size was mildly dilated.   5. Moderate pleural effusion in the left lateral region.   6. The mitral valve is abnormal. Trivial mitral valve regurgitation.   7. The aortic valve is tricuspid. Aortic valve regurgitation is not  visualized. Mild aortic valve sclerosis is present, with no evidence of  aortic valve stenosis. Aortic valve mean gradient measures 6.0 mmHg.   8. The inferior vena cava is dilated in size with <50% respiratory  variability, suggesting right atrial pressure of 15 mmHg.   Comparison(s): Changes from prior study are noted. 12/09/15: LVEF 65-70%, normal wall motion.  Risk Assessment/Calculations:    CHA2DS2-VASc Score = 7  } This indicates a 11.2% annual risk of stroke. The patient's score is based upon: CHF History: 1 HTN History: 1 Diabetes History: 0 Stroke History: 2 Vascular Disease History: 1 Age Score: 2 Gender Score: 0  Physical Exam:   VS:  There were no vitals taken for this visit.   Wt Readings from Last 3 Encounters:  11/12/23 169 lb 6.4 oz (76.8 kg)  10/12/23 164 lb 9.6 oz (74.7 kg)  10/07/23 174 lb 6.4 oz (79.1 kg)    GEN: Well nourished, well developed in  no acute distress NECK: No JVD; No carotid bruits CARDIAC: S1/S2, irregularly irregular rhythm, no murmurs, rubs, gallops RESPIRATORY:  Clear to auscultation without rales, wheezing or rhonchi  ABDOMEN: Soft, non-tender, non-distended EXTREMITIES:  No edema; No deformity   ASSESSMENT AND PLAN: .    HFmrEF Stage C, NYHA class I symptoms. Weight is better since back on Lasix . Most recent echocardiogram revealed EF 45%, severe asymmetric left ventricle hypertrophy with findings consistent with right ventricular pressure and volume overload.  Moderately elevated PASP with estimated right ventricular systolic pressure at 48.4 mmHg. Continue rest of GDMT at this time. Low sodium diet, fluid restriction <2L, and daily weights encouraged. Educated to contact our office for weight gain of 2 lbs overnight or 5 lbs in one week.  2. CAD, s/p CABG  Hx graft dx and DES to SVG-OM in 2017, s/p DES to SVG-OM in 2022, occlusion of PDA and SVG diagonal, patent LIMA-LAD. Stable with no anginal symptoms. No indication for ischemic evaluation. Continue current medication regimen.  Heart healthy diet and regular cardiovascular exercise encouraged.   3. Permanent A-fib, bradycardia Denies any tachycardia or palpitations. Previous monitor revealed predominant A-fib with average HR 41 bpm. Was noted that in right clinical context, could be consistent with chronotropic incompetence and symptomatic bradycardia, frequent PVC's also noted, no pauses. Tolerating Lopressor  well. Denies any recent symptoms. Will continue to monitor as he denies any recent symptoms. Heart healthy diet and regular cardiovascular exercise encouraged. Has consistently declined AC in the past.   4. Mixed HLD, hx of statin intolerance, carotid artery stenosis Last LDL in 80's. Goal LDL is < 55. Continue current medication regimen. Has some hx of statin intolerance in the past (see allergy list), discussed [id number] - pt declines. Heart healthy diet and  regular cardiovascular exercise encouraged. Continue to follow with PCP. Carotid duplex revealed 1-39% stenosis along bilateral ICA, denies any symptoms.    Medication Adjustments/Labs and Tests Ordered: Current medicines are reviewed at length with the patient today.  Concerns regarding medicines are outlined above.   Tests Ordered: No orders of the defined types were placed in this encounter.   Medication Changes: No orders of the defined types were placed in this encounter.   Follow Up: With Dr. Debera or APP in 6 months or sooner if anything changes.    Signed, Almarie Crate, NP

## 2023-12-27 ENCOUNTER — Ambulatory Visit: Admitting: Gastroenterology

## 2024-02-01 ENCOUNTER — Other Ambulatory Visit (HOSPITAL_COMMUNITY): Payer: Self-pay | Admitting: Cardiology

## 2024-02-14 ENCOUNTER — Inpatient Hospital Stay (HOSPITAL_COMMUNITY)
Admission: EM | Admit: 2024-02-14 | Discharge: 2024-02-19 | DRG: 377 | Disposition: A | Discharge status: Home Health | Attending: Critical Care Medicine | Admitting: Critical Care Medicine

## 2024-02-14 ENCOUNTER — Other Ambulatory Visit: Payer: Self-pay

## 2024-02-14 DIAGNOSIS — R933 Abnormal findings on diagnostic imaging of other parts of digestive tract: Secondary | ICD-10-CM

## 2024-02-14 DIAGNOSIS — D62 Acute posthemorrhagic anemia: Secondary | ICD-10-CM | POA: Diagnosis present

## 2024-02-14 DIAGNOSIS — I5023 Acute on chronic systolic (congestive) heart failure: Secondary | ICD-10-CM | POA: Diagnosis present

## 2024-02-14 DIAGNOSIS — I2510 Atherosclerotic heart disease of native coronary artery without angina pectoris: Secondary | ICD-10-CM | POA: Diagnosis present

## 2024-02-14 DIAGNOSIS — J189 Pneumonia, unspecified organism: Secondary | ICD-10-CM | POA: Diagnosis present

## 2024-02-14 DIAGNOSIS — I110 Hypertensive heart disease with heart failure: Secondary | ICD-10-CM | POA: Diagnosis present

## 2024-02-14 DIAGNOSIS — Z7982 Long term (current) use of aspirin: Secondary | ICD-10-CM

## 2024-02-14 DIAGNOSIS — Z823 Family history of stroke: Secondary | ICD-10-CM

## 2024-02-14 DIAGNOSIS — R578 Other shock: Secondary | ICD-10-CM | POA: Diagnosis not present

## 2024-02-14 DIAGNOSIS — I482 Chronic atrial fibrillation, unspecified: Secondary | ICD-10-CM | POA: Diagnosis present

## 2024-02-14 DIAGNOSIS — Z8673 Personal history of transient ischemic attack (TIA), and cerebral infarction without residual deficits: Secondary | ICD-10-CM

## 2024-02-14 DIAGNOSIS — E785 Hyperlipidemia, unspecified: Secondary | ICD-10-CM | POA: Diagnosis present

## 2024-02-14 DIAGNOSIS — R7989 Other specified abnormal findings of blood chemistry: Secondary | ICD-10-CM | POA: Diagnosis present

## 2024-02-14 DIAGNOSIS — K222 Esophageal obstruction: Secondary | ICD-10-CM | POA: Diagnosis present

## 2024-02-14 DIAGNOSIS — I252 Old myocardial infarction: Secondary | ICD-10-CM

## 2024-02-14 DIAGNOSIS — Z955 Presence of coronary angioplasty implant and graft: Secondary | ICD-10-CM

## 2024-02-14 DIAGNOSIS — J440 Chronic obstructive pulmonary disease with acute lower respiratory infection: Secondary | ICD-10-CM | POA: Diagnosis present

## 2024-02-14 DIAGNOSIS — Z79899 Other long term (current) drug therapy: Secondary | ICD-10-CM

## 2024-02-14 DIAGNOSIS — N179 Acute kidney failure, unspecified: Secondary | ICD-10-CM | POA: Diagnosis not present

## 2024-02-14 DIAGNOSIS — K921 Melena: Principal | ICD-10-CM

## 2024-02-14 DIAGNOSIS — Z7984 Long term (current) use of oral hypoglycemic drugs: Secondary | ICD-10-CM

## 2024-02-14 DIAGNOSIS — Z8249 Family history of ischemic heart disease and other diseases of the circulatory system: Secondary | ICD-10-CM

## 2024-02-14 DIAGNOSIS — I671 Cerebral aneurysm, nonruptured: Secondary | ICD-10-CM | POA: Diagnosis present

## 2024-02-14 DIAGNOSIS — K5730 Diverticulosis of large intestine without perforation or abscess without bleeding: Secondary | ICD-10-CM

## 2024-02-14 DIAGNOSIS — R58 Hemorrhage, not elsewhere classified: Secondary | ICD-10-CM | POA: Diagnosis present

## 2024-02-14 DIAGNOSIS — Z888 Allergy status to other drugs, medicaments and biological substances status: Secondary | ICD-10-CM

## 2024-02-14 DIAGNOSIS — K5521 Angiodysplasia of colon with hemorrhage: Principal | ICD-10-CM | POA: Diagnosis present

## 2024-02-14 DIAGNOSIS — Z961 Presence of intraocular lens: Secondary | ICD-10-CM | POA: Diagnosis present

## 2024-02-14 DIAGNOSIS — Z87891 Personal history of nicotine dependence: Secondary | ICD-10-CM

## 2024-02-14 DIAGNOSIS — E876 Hypokalemia: Secondary | ICD-10-CM | POA: Diagnosis present

## 2024-02-14 DIAGNOSIS — Z7901 Long term (current) use of anticoagulants: Secondary | ICD-10-CM

## 2024-02-14 LAB — I-STAT CHEM 8, ED
BUN: 32 mg/dL — ABNORMAL HIGH (ref 8–23)
Calcium, Ion: 1.1 mmol/L — ABNORMAL LOW (ref 1.15–1.40)
Chloride: 105 mmol/L (ref 98–111)
Creatinine, Ser: 1.6 mg/dL — ABNORMAL HIGH (ref 0.61–1.24)
Glucose, Bld: 116 mg/dL — ABNORMAL HIGH (ref 70–99)
HCT: 27 % — ABNORMAL LOW (ref 39.0–52.0)
Hemoglobin: 9.2 g/dL — ABNORMAL LOW (ref 13.0–17.0)
Potassium: 4.7 mmol/L (ref 3.5–5.1)
Sodium: 140 mmol/L (ref 135–145)
TCO2: 24 mmol/L (ref 22–32)

## 2024-02-14 MED ORDER — SODIUM CHLORIDE 0.9% IV SOLUTION: Freq: Once | INTRAVENOUS | Status: AC

## 2024-02-14 MED ORDER — LACTATED RINGERS IV BOLUS
1000.0000 mL | Freq: Once | INTRAVENOUS | Status: AC
  Administered 2024-02-15: 1000 mL via INTRAVENOUS

## 2024-02-14 NOTE — ED Provider Notes (Signed)
 [place] [facility] AT [facility] Provider Note   CSN: [id number] Arrival date & time: 02/14/24  2331     History Chief Complaint  Patient presents with   GI Bleeding    HPI: William Elliott is a 83 y.o. male with history pertinent CAD, hypertension, hyperlipidemia, COPD not on home aspirin , prior CVA, A-fib, who presents complaining of hematochezia. Patient arrived via EMS from home.  History provided by patient.  No interpreter required during this encounter.  Patient reportedly has been in his normal state of health over the past several days.  Patient developed generalized weakness beginning this afternoon, had a bowel movement with gross hematochezia at 1700 hrs.  And at 2145, patient had another episode of gross hematochezia.  EMS reports that patient's family reported that patient is on Eliquis.  Patient received approximately 800 cc of crystalloids and route for low normotension.  Patient endorses malaise, denies chest pain, shortness of breath, abdominal pain.  Patient reports that the blood on his feet and legs from where he stepped in his bloody bowel movement  [name] recorded medical, surgical, social, medication list and allergies were reviewed in the Snapshot window as part of the initial history.   Prior to Admission medications  Medication Sig Start Date End Date Taking? Authorizing Provider  acetaminophen  (TYLENOL ) 500 MG tablet Take 1,000 mg by mouth 2 (two) times daily as needed for moderate pain (pain score 4-6), fever or headache.    [provider]  albuterol  (VENTOLIN  HFA) 108 (90 Base) MCG/ACT inhaler Inhale 2 puffs into the lungs every 6 (six) hours as needed for wheezing or shortness of breath.    [provider]  ALPRAZolam  (XANAX ) 1 MG tablet Take 1 tablet (1 mg total) by mouth at bedtime as needed for anxiety. 11/01/16   Triplett, Tammy, PA-C  Ascorbic Acid  (VITAMIN C  PO) Take 1 tablet by mouth daily with breakfast.     [provider]  aspirin  EC 81 MG tablet Take 1 tablet (81 mg total) by mouth daily. 02/12/17   Debera Jayson MATSU, MD  B Complex-C (SUPER B COMPLEX PO) Take 1 capsule by mouth daily with breakfast.    [provider]  CHELATED ZINC  PO Take 1 tablet by mouth daily with breakfast.    [provider]  empagliflozin  (JARDIANCE ) 10 MG TABS tablet Take 1 tablet (10 mg total) by mouth daily. 10/07/23   Hayes Beckey CROME, NP  Ferrous Sulfate (IRON PO) Take 1 tablet by mouth daily with breakfast.    [provider]  furosemide  (LASIX ) 20 MG tablet Take 4 tablets (80 mg total) by mouth daily. 11/12/23   Lee, Jordan, NP  ipratropium-albuterol  (DUONEB) 0.5-2.5 (3) MG/3ML SOLN Take 3 mLs by nebulization every 4 (four) hours as needed (wheezing, shortness of breath). 03/11/23   [provider]  isosorbide  mononitrate (IMDUR ) 30 MG 24 hr tablet Take 0.5 tablets (15 mg total) by mouth at bedtime. 10/07/23   Hayes Beckey CROME, NP  losartan  (COZAAR ) 25 MG tablet Take 0.5 tablets (12.5 mg total) by mouth daily. 11/12/23   Lee, Jordan, NP  mexiletine (MEXITIL) 200 MG capsule TAKE ONE CAPSULE BY MOUTH TWICE DAILY 02/04/24   Marcine Catalan M, PA-C  Multiple Vitamins-Minerals (MENS 50+ MULTIVITAMIN) TABS Take 1 tablet by mouth daily with breakfast.    [provider]  NEXLIZET 180-10 MG TABS Take 1 tablet by mouth daily. 07/23/22   [provider]  nitroGLYCERIN  (NITROSTAT ) 0.4 MG SL  tablet DISSOLVE 1 TABLET UNDER THE TONGUE EVERY 5 MINUTES AS NEEDED FOR CHEST PAIN. DO NOT EXCEED 3 DOSES IN 15 MIN. CALL 911 IF NO RELIEF 04/19/23   Debera Jayson MATSU, MD  Omega-3 Fatty Acids (FISH OIL PO) Take 1 capsule by mouth daily with breakfast.    [provider]  pantoprazole  (PROTONIX ) 40 MG tablet TAKE 1 TABLET BY MOUTH EVERY DAY 02/09/19   Debera Jayson MATSU, MD  potassium chloride  SA (KLOR-CON  M) 20 MEQ tablet Take 20 mEq by mouth daily. Take with Lasix     [provider]  spironolactone  (ALDACTONE ) 25 MG tablet Take 1 tablet (25 mg total) by mouth daily. 10/14/23   Milford, Harlene HERO, FNP  Tiotropium Bromide -Olodaterol (STIOLTO RESPIMAT ) 2.5-2.5 MCG/ACT AERS Inhale 2 puffs into the lungs daily.    [provider]     Allergies: Anoro ellipta  [umeclidinium-vilanterol], Lipitor [atorvastatin calcium ], Niaspan [niacin], Tricor [fenofibrate], and Zocor [simvastatin]   Review of Systems   ROS as per HPI  Physical Exam Updated Vital Signs BP 100/69   Pulse 71   Resp 19   SpO2 100%  Physical Exam Vitals and nursing note reviewed. Exam conducted with a chaperone present.  Constitutional:      General: He is not in acute distress.    Appearance: He is well-developed.  HENT:     Head: Normocephalic and atraumatic.  Eyes:     Conjunctiva/sclera: Conjunctivae normal.  Cardiovascular:     Rate and Rhythm: Normal rate and regular rhythm.     Heart sounds: No murmur heard. Pulmonary:     Effort: Pulmonary effort is normal. No respiratory distress.     Breath sounds: Normal breath sounds.  Abdominal:     Palpations: Abdomen is soft.     Tenderness: There is no abdominal tenderness.  Genitourinary:    Comments: Gross hematochezia and clots from the rectum Musculoskeletal:        General: No swelling.     Cervical back: Neck supple.  Skin:    General: Skin is warm and dry.     Capillary Refill: Capillary refill takes less than 2 seconds.     Comments: Blood coating the bottom of patient's bilateral feet as well as the medial aspect of his right lower extremity  Neurological:     Mental Status: He is alert.  Psychiatric:        Mood and Affect: Mood normal.     ED Course/ Medical Decision Making/ A&P    Procedures Procedures   Medications Ordered in ED Medications  0.9 %  sodium chloride  infusion (Manually program via Guardrails IV Fluids) (has no administration in time range)    Medical Decision Making:   William Elliott is a 83 y.o. male who presents for gross hematochezia as per above.  Physical exam is pertinent for gross hematochezia, red blood on feet and legs reportedly from his most recent bowel movement.   The differential includes but is not limited to diverticular hemorrhage, aortoenteric fistula, acute blood loss anemia, NSTEMI, demand ischemia.  Independent historian: EMS  External data reviewed: Labs: reviewed prior labs for baseline and Notes: Reviewed prior discharge summary.  Patient had prior GI bleed in November 2024 as well as July 2025.  Both lower GI bleeds were accompanied by NSTEMI.  The source of bleed was not found during patient's prior hospitalization, however suspected source was diverticular hemorrhage per [place] [facility] note from 7/16.  Initial Plan:  Screening labs including CBC  and Metabolic panel to evaluate for infectious or metabolic etiology of disease.  Type and screen, crossmatch for 4 units of blood Screening troponin given patient's prior to GI bleeds have been associated with NSTEMI with troponins in the 9000's in July 2025 CTA abdomen pelvis to evaluate for active extravasation EKG to evaluate for cardiac pathology Objective evaluation as below reviewed   Labs: Ordered, Independent interpretation, and Details: ***  Radiology: Ordered, Independent interpretation, Details: ***, and All images reviewed independently. ***Agree with radiology report at this time.   No results found.  EKG/Medicine tests: Ordered and Independent interpretation EKG Interpretation:                  Interventions: LR bolus  See the EMR for full details regarding lab and imaging results.  Patient presents for low normotension and gross hematochezia, has gross hematochezia obvious on exam.  Has low normotension, however is not hypotensive, therefore patient was consented for blood, however given patient is mentating appropriately, abdomen soft, nontender, blood pressure currently  holding, will not administer emergency release blood at this time.  Will obtain screening labs and imaging as per initial plan above.  {LSCOPA:33420}  Discussion of management or test interpretations with external provider(s): ***  Risk Drugs:{LSDRUGS:33399} Treatment: {LSTREATMENT:33409} Surgery:{LSSURGERY:33410} Critical Care: ***  Disposition: {LSDISPO:33388}  MDM generated using voice dictation software and may contain dictation errors.  Please contact me for any clarification or with any questions.  Clinical Impression:  1. Hematochezia      Admit   Final Clinical Impression(s) / ED Diagnoses Final diagnoses:  Hematochezia    Rx / DC Orders ED Discharge Orders     None

## 2024-02-14 NOTE — ED Triage Notes (Signed)
 Patient BIB [facility] from home for GI bleeding starting around 1700 with a BM on the toilet, then around 2145 patient woke up and had large bloody stool on the floor. Patient is on eliquis and has hx of GI bleed previously.  BP 102/60 HR 70 R 16 98% RA

## 2024-02-15 ENCOUNTER — Emergency Department (HOSPITAL_COMMUNITY)

## 2024-02-15 ENCOUNTER — Inpatient Hospital Stay (HOSPITAL_COMMUNITY)

## 2024-02-15 DIAGNOSIS — I5023 Acute on chronic systolic (congestive) heart failure: Secondary | ICD-10-CM | POA: Diagnosis present

## 2024-02-15 DIAGNOSIS — J440 Chronic obstructive pulmonary disease with acute lower respiratory infection: Secondary | ICD-10-CM

## 2024-02-15 DIAGNOSIS — I4891 Unspecified atrial fibrillation: Secondary | ICD-10-CM | POA: Diagnosis not present

## 2024-02-15 DIAGNOSIS — D62 Acute posthemorrhagic anemia: Secondary | ICD-10-CM | POA: Diagnosis present

## 2024-02-15 DIAGNOSIS — I2510 Atherosclerotic heart disease of native coronary artery without angina pectoris: Secondary | ICD-10-CM

## 2024-02-15 DIAGNOSIS — R578 Other shock: Secondary | ICD-10-CM | POA: Diagnosis not present

## 2024-02-15 DIAGNOSIS — E785 Hyperlipidemia, unspecified: Secondary | ICD-10-CM

## 2024-02-15 DIAGNOSIS — N179 Acute kidney failure, unspecified: Secondary | ICD-10-CM | POA: Diagnosis not present

## 2024-02-15 DIAGNOSIS — I5022 Chronic systolic (congestive) heart failure: Secondary | ICD-10-CM | POA: Diagnosis not present

## 2024-02-15 DIAGNOSIS — R58 Hemorrhage, not elsewhere classified: Secondary | ICD-10-CM | POA: Diagnosis not present

## 2024-02-15 DIAGNOSIS — K222 Esophageal obstruction: Secondary | ICD-10-CM | POA: Diagnosis present

## 2024-02-15 DIAGNOSIS — K5730 Diverticulosis of large intestine without perforation or abscess without bleeding: Secondary | ICD-10-CM | POA: Diagnosis not present

## 2024-02-15 DIAGNOSIS — I10 Essential (primary) hypertension: Secondary | ICD-10-CM | POA: Diagnosis not present

## 2024-02-15 DIAGNOSIS — K5520 Angiodysplasia of colon without hemorrhage: Secondary | ICD-10-CM | POA: Diagnosis not present

## 2024-02-15 DIAGNOSIS — Z8249 Family history of ischemic heart disease and other diseases of the circulatory system: Secondary | ICD-10-CM | POA: Diagnosis not present

## 2024-02-15 DIAGNOSIS — I110 Hypertensive heart disease with heart failure: Secondary | ICD-10-CM

## 2024-02-15 DIAGNOSIS — Z79899 Other long term (current) drug therapy: Secondary | ICD-10-CM | POA: Diagnosis not present

## 2024-02-15 DIAGNOSIS — K5790 Diverticulosis of intestine, part unspecified, without perforation or abscess without bleeding: Secondary | ICD-10-CM | POA: Diagnosis not present

## 2024-02-15 DIAGNOSIS — E876 Hypokalemia: Secondary | ICD-10-CM | POA: Diagnosis present

## 2024-02-15 DIAGNOSIS — I252 Old myocardial infarction: Secondary | ICD-10-CM | POA: Diagnosis not present

## 2024-02-15 DIAGNOSIS — I25810 Atherosclerosis of coronary artery bypass graft(s) without angina pectoris: Secondary | ICD-10-CM | POA: Diagnosis not present

## 2024-02-15 DIAGNOSIS — Z7984 Long term (current) use of oral hypoglycemic drugs: Secondary | ICD-10-CM | POA: Diagnosis not present

## 2024-02-15 DIAGNOSIS — I959 Hypotension, unspecified: Secondary | ICD-10-CM | POA: Diagnosis not present

## 2024-02-15 DIAGNOSIS — Z8673 Personal history of transient ischemic attack (TIA), and cerebral infarction without residual deficits: Secondary | ICD-10-CM | POA: Diagnosis not present

## 2024-02-15 DIAGNOSIS — K922 Gastrointestinal hemorrhage, unspecified: Secondary | ICD-10-CM | POA: Diagnosis not present

## 2024-02-15 DIAGNOSIS — K921 Melena: Secondary | ICD-10-CM

## 2024-02-15 DIAGNOSIS — R7989 Other specified abnormal findings of blood chemistry: Secondary | ICD-10-CM | POA: Diagnosis present

## 2024-02-15 DIAGNOSIS — Z87891 Personal history of nicotine dependence: Secondary | ICD-10-CM | POA: Diagnosis not present

## 2024-02-15 DIAGNOSIS — I671 Cerebral aneurysm, nonruptured: Secondary | ICD-10-CM | POA: Diagnosis present

## 2024-02-15 DIAGNOSIS — J189 Pneumonia, unspecified organism: Secondary | ICD-10-CM | POA: Diagnosis present

## 2024-02-15 DIAGNOSIS — Z955 Presence of coronary angioplasty implant and graft: Secondary | ICD-10-CM | POA: Diagnosis not present

## 2024-02-15 DIAGNOSIS — Z7901 Long term (current) use of anticoagulants: Secondary | ICD-10-CM | POA: Diagnosis not present

## 2024-02-15 DIAGNOSIS — K5521 Angiodysplasia of colon with hemorrhage: Secondary | ICD-10-CM | POA: Diagnosis present

## 2024-02-15 DIAGNOSIS — I482 Chronic atrial fibrillation, unspecified: Secondary | ICD-10-CM | POA: Diagnosis present

## 2024-02-15 DIAGNOSIS — Z7982 Long term (current) use of aspirin: Secondary | ICD-10-CM | POA: Diagnosis not present

## 2024-02-15 LAB — COMPREHENSIVE METABOLIC PANEL WITH GFR
ALT: 15 U/L (ref 0–44)
AST: 35 U/L (ref 15–41)
Albumin: 2.8 g/dL — ABNORMAL LOW (ref 3.5–5.0)
Alkaline Phosphatase: 56 U/L (ref 38–126)
Anion gap: 9 (ref 5–15)
BUN: 29 mg/dL — ABNORMAL HIGH (ref 8–23)
CO2: 24 mmol/L (ref 22–32)
Calcium: 8.1 mg/dL — ABNORMAL LOW (ref 8.9–10.3)
Chloride: 105 mmol/L (ref 98–111)
Creatinine, Ser: 1.47 mg/dL — ABNORMAL HIGH (ref 0.61–1.24)
GFR, Estimated: 48 mL/min — ABNORMAL LOW (ref >60)
Glucose, Bld: 121 mg/dL — ABNORMAL HIGH (ref 70–99)
Potassium: 4.5 mmol/L (ref 3.5–5.1)
Sodium: 138 mmol/L (ref 135–145)
Total Bilirubin: 1.3 mg/dL — ABNORMAL HIGH (ref 0.0–1.2)
Total Protein: 5.3 g/dL — ABNORMAL LOW (ref 6.5–8.1)

## 2024-02-15 LAB — CBC WITH DIFFERENTIAL/PLATELET
Abs Immature Granulocytes: 0.19 K/uL — ABNORMAL HIGH (ref 0.00–0.07)
Basophils Absolute: 0 K/uL (ref 0.0–0.1)
Basophils Relative: 0 %
Eosinophils Absolute: 0 K/uL (ref 0.0–0.5)
Eosinophils Relative: 0 %
HCT: 27 % — ABNORMAL LOW (ref 39.0–52.0)
Hemoglobin: 8.3 g/dL — ABNORMAL LOW (ref 13.0–17.0)
Immature Granulocytes: 1 %
Lymphocytes Relative: 2 %
Lymphs Abs: 0.5 K/uL — ABNORMAL LOW (ref 0.7–4.0)
MCH: 27.1 pg (ref 26.0–34.0)
MCHC: 30.7 g/dL (ref 30.0–36.0)
MCV: 88.2 fL (ref 80.0–100.0)
Monocytes Absolute: 1.1 K/uL — ABNORMAL HIGH (ref 0.1–1.0)
Monocytes Relative: 6 %
Neutro Abs: 18.6 K/uL — ABNORMAL HIGH (ref 1.7–7.7)
Neutrophils Relative %: 91 %
Platelets: 308 K/uL (ref 150–400)
RBC: 3.06 MIL/uL — ABNORMAL LOW (ref 4.22–5.81)
RDW: 19.5 % — ABNORMAL HIGH (ref 11.5–15.5)
WBC: 20.4 K/uL — ABNORMAL HIGH (ref 4.0–10.5)
nRBC: 0 % (ref 0.0–0.2)

## 2024-02-15 LAB — CBC
HCT: 29.1 % — ABNORMAL LOW (ref 39.0–52.0)
HCT: 21.8 % — ABNORMAL LOW (ref 39.0–52.0)
HCT: 27.9 % — ABNORMAL LOW (ref 39.0–52.0)
HCT: 26.2 % — ABNORMAL LOW (ref 39.0–52.0)
Hemoglobin: 9.4 g/dL — ABNORMAL LOW (ref 13.0–17.0)
Hemoglobin: 7.2 g/dL — ABNORMAL LOW (ref 13.0–17.0)
Hemoglobin: 9.6 g/dL — ABNORMAL LOW (ref 13.0–17.0)
Hemoglobin: 9 g/dL — ABNORMAL LOW (ref 13.0–17.0)
MCH: 27.4 pg (ref 26.0–34.0)
MCH: 28.6 pg (ref 26.0–34.0)
MCH: 29.7 pg (ref 26.0–34.0)
MCH: 29.3 pg (ref 26.0–34.0)
MCHC: 32.3 g/dL (ref 30.0–36.0)
MCHC: 33 g/dL (ref 30.0–36.0)
MCHC: 34.4 g/dL (ref 30.0–36.0)
MCHC: 34.4 g/dL (ref 30.0–36.0)
MCV: 84.8 fL (ref 80.0–100.0)
MCV: 86.5 fL (ref 80.0–100.0)
MCV: 86.4 fL (ref 80.0–100.0)
MCV: 85.3 fL (ref 80.0–100.0)
Platelets: 264 K/uL (ref 150–400)
Platelets: 224 K/uL (ref 150–400)
Platelets: 234 K/uL (ref 150–400)
Platelets: 237 K/uL (ref 150–400)
RBC: 3.43 MIL/uL — ABNORMAL LOW (ref 4.22–5.81)
RBC: 2.52 MIL/uL — ABNORMAL LOW (ref 4.22–5.81)
RBC: 3.23 MIL/uL — ABNORMAL LOW (ref 4.22–5.81)
RBC: 3.07 MIL/uL — ABNORMAL LOW (ref 4.22–5.81)
RDW: 18.2 % — ABNORMAL HIGH (ref 11.5–15.5)
RDW: 17.3 % — ABNORMAL HIGH (ref 11.5–15.5)
RDW: 16.5 % — ABNORMAL HIGH (ref 11.5–15.5)
RDW: 16.4 % — ABNORMAL HIGH (ref 11.5–15.5)
WBC: 18 K/uL — ABNORMAL HIGH (ref 4.0–10.5)
WBC: 16.3 K/uL — ABNORMAL HIGH (ref 4.0–10.5)
WBC: 22.6 K/uL — ABNORMAL HIGH (ref 4.0–10.5)
WBC: 21.9 K/uL — ABNORMAL HIGH (ref 4.0–10.5)
nRBC: 0 % (ref 0.0–0.2)
nRBC: 0 % (ref 0.0–0.2)
nRBC: 0 % (ref 0.0–0.2)
nRBC: 0 % (ref 0.0–0.2)

## 2024-02-15 LAB — PREPARE RBC (CROSSMATCH)

## 2024-02-15 LAB — URINALYSIS, W/ REFLEX TO CULTURE (INFECTION SUSPECTED)
Bacteria, UA: NONE SEEN
Bilirubin Urine: NEGATIVE
Glucose, UA: >=500 mg/dL — AB
Hgb urine dipstick: NEGATIVE
Ketones, ur: NEGATIVE mg/dL
Leukocytes,Ua: NEGATIVE
Nitrite: NEGATIVE
Protein, ur: NEGATIVE mg/dL
Specific Gravity, Urine: 1.044 — ABNORMAL HIGH (ref 1.005–1.030)
pH: 6 (ref 5.0–8.0)

## 2024-02-15 LAB — BASIC METABOLIC PANEL WITH GFR
Anion gap: 13 (ref 5–15)
BUN: 28 mg/dL — ABNORMAL HIGH (ref 8–23)
CO2: 22 mmol/L (ref 22–32)
Calcium: 8.4 mg/dL — ABNORMAL LOW (ref 8.9–10.3)
Chloride: 105 mmol/L (ref 98–111)
Creatinine, Ser: 1.34 mg/dL — ABNORMAL HIGH (ref 0.61–1.24)
GFR, Estimated: 53 mL/min — ABNORMAL LOW (ref >60)
Glucose, Bld: 126 mg/dL — ABNORMAL HIGH (ref 70–99)
Potassium: 4.9 mmol/L (ref 3.5–5.1)
Sodium: 140 mmol/L (ref 135–145)

## 2024-02-15 LAB — MAGNESIUM: Magnesium: 2.1 mg/dL (ref 1.7–2.4)

## 2024-02-15 LAB — GLUCOSE, CAPILLARY: Glucose-Capillary: 114 mg/dL — ABNORMAL HIGH (ref 70–99)

## 2024-02-15 LAB — TROPONIN I (HIGH SENSITIVITY)
Troponin I (High Sensitivity): 41 ng/L — ABNORMAL HIGH (ref <18)
Troponin I (High Sensitivity): 58 ng/L — ABNORMAL HIGH (ref <18)

## 2024-02-15 LAB — PROTIME-INR
INR: 1.3 — ABNORMAL HIGH (ref 0.8–1.2)
Prothrombin Time: 17.3 s — ABNORMAL HIGH (ref 11.4–15.2)

## 2024-02-15 LAB — MRSA NEXT GEN BY PCR, NASAL: MRSA by PCR Next Gen: NOT DETECTED

## 2024-02-15 LAB — I-STAT CG4 LACTIC ACID, ED: Lactic Acid, Venous: 1.7 mmol/L (ref 0.5–1.9)

## 2024-02-15 MED ORDER — ACETAMINOPHEN 500 MG PO TABS: 1000.0000 mg | ORAL_TABLET | Freq: Three times a day (TID) | ORAL | Status: DC

## 2024-02-15 MED ORDER — POLYETHYLENE GLYCOL 3350 17 G PO PACK: 17.0000 g | PACK | Freq: Every day | ORAL | Status: DC | PRN

## 2024-02-15 MED ORDER — SODIUM CHLORIDE 0.9% IV SOLUTION
Freq: Once | INTRAVENOUS | Status: AC
  Administered 2024-02-15: 09:00:00 10 mL/h via INTRAVENOUS

## 2024-02-15 MED ORDER — IPRATROPIUM-ALBUTEROL 0.5-2.5 (3) MG/3ML IN SOLN
3.0000 mL | RESPIRATORY_TRACT | Status: DC | PRN
  Administered 2024-02-16 – 2024-02-19 (×4): 3 mL via RESPIRATORY_TRACT
  Filled 2024-02-15 (×4): qty 3

## 2024-02-15 MED ORDER — CHLORHEXIDINE GLUCONATE CLOTH 2 % EX PADS
6.0000 | MEDICATED_PAD | Freq: Every day | CUTANEOUS | Status: DC
  Administered 2024-02-15 – 2024-02-18 (×4): 6 via TOPICAL

## 2024-02-15 MED ORDER — SODIUM CHLORIDE 0.9% IV SOLUTION
Freq: Once | INTRAVENOUS | Status: AC
  Administered 2024-02-15: 14:00:00 10 mL/h via INTRAVENOUS

## 2024-02-15 MED ORDER — PANTOPRAZOLE SODIUM 40 MG IV SOLR
40.0000 mg | Freq: Two times a day (BID) | INTRAVENOUS | Status: DC
  Administered 2024-02-15 – 2024-02-16 (×3): 40 mg via INTRAVENOUS
  Filled 2024-02-15 (×3): qty 10

## 2024-02-15 MED ORDER — SODIUM CHLORIDE 0.9% IV SOLUTION: Freq: Once | INTRAVENOUS | Status: AC

## 2024-02-15 MED ORDER — ONDANSETRON HCL 4 MG/2ML IJ SOLN: 4.0000 mg | Freq: Four times a day (QID) | INTRAMUSCULAR | Status: DC | PRN

## 2024-02-15 MED ORDER — LACTATED RINGERS IV BOLUS
1000.0000 mL | Freq: Once | INTRAVENOUS | Status: AC
  Administered 2024-02-15: 08:00:00 1000 mL via INTRAVENOUS

## 2024-02-15 MED ORDER — ORAL CARE MOUTH RINSE: 15.0000 mL | OROMUCOSAL | Status: DC | PRN

## 2024-02-15 MED ORDER — IOHEXOL 350 MG/ML SOLN
100.0000 mL | Freq: Once | INTRAVENOUS | Status: AC | PRN
  Administered 2024-02-15: 10:00:00 100 mL via INTRAVENOUS

## 2024-02-15 MED ORDER — SODIUM CHLORIDE 0.9 % IV SOLN
2.0000 g | Freq: Once | INTRAVENOUS | Status: AC
  Administered 2024-02-15: 02:00:00 2 g via INTRAVENOUS
  Filled 2024-02-15: qty 20

## 2024-02-15 MED ORDER — IOHEXOL 350 MG/ML SOLN
75.0000 mL | Freq: Once | INTRAVENOUS | Status: AC | PRN
  Administered 2024-02-15: 75 mL via INTRAVENOUS

## 2024-02-15 MED ORDER — IOHEXOL 300 MG/ML  SOLN: 150.0000 mL | Freq: Once | INTRAMUSCULAR | Status: DC | PRN

## 2024-02-15 MED ORDER — SODIUM CHLORIDE 0.9 % IV SOLN
2.0000 g | INTRAVENOUS | Status: AC
  Administered 2024-02-16 – 2024-02-19 (×4): 2 g via INTRAVENOUS
  Filled 2024-02-15 (×4): qty 20

## 2024-02-15 MED ORDER — DOCUSATE SODIUM 100 MG PO CAPS
100.0000 mg | ORAL_CAPSULE | Freq: Two times a day (BID) | ORAL | Status: DC | PRN
  Administered 2024-02-18: 100 mg via ORAL
  Filled 2024-02-15: qty 1

## 2024-02-15 MED ORDER — ALPRAZOLAM 0.5 MG PO TABS
0.5000 mg | ORAL_TABLET | Freq: Every evening | ORAL | Status: DC | PRN
  Administered 2024-02-15 – 2024-02-16 (×2): 0.5 mg via ORAL
  Filled 2024-02-15 (×2): qty 1

## 2024-02-15 MED ORDER — POLYETHYLENE GLYCOL 3350 17 GM/SCOOP PO POWD
238.0000 g | Freq: Once | ORAL | Status: AC
  Administered 2024-02-15 (×2): 119 g via ORAL
  Filled 2024-02-15: qty 238

## 2024-02-15 MED ORDER — REVEFENACIN 175 MCG/3ML IN SOLN
175.0000 ug | Freq: Every day | RESPIRATORY_TRACT | Status: DC
  Administered 2024-02-15 – 2024-02-19 (×5): 175 ug via RESPIRATORY_TRACT
  Filled 2024-02-15 (×5): qty 3

## 2024-02-15 MED ORDER — LIDOCAINE-EPINEPHRINE 1 %-1:100000 IJ SOLN
INTRAMUSCULAR | Status: AC
  Filled 2024-02-15: qty 1

## 2024-02-15 MED ORDER — SIMETHICONE 80 MG PO CHEW
80.0000 mg | CHEWABLE_TABLET | Freq: Once | ORAL | Status: AC
  Administered 2024-02-15: 17:00:00 80 mg via ORAL
  Filled 2024-02-15: qty 1

## 2024-02-15 MED ORDER — SODIUM CHLORIDE 0.9% IV SOLUTION
Freq: Once | INTRAVENOUS | Status: AC
  Administered 2024-02-15: 03:00:00 10 mL via INTRAVENOUS

## 2024-02-15 MED ORDER — ARFORMOTEROL TARTRATE 15 MCG/2ML IN NEBU
15.0000 ug | INHALATION_SOLUTION | Freq: Two times a day (BID) | RESPIRATORY_TRACT | Status: DC
  Administered 2024-02-15 – 2024-02-19 (×9): 15 ug via RESPIRATORY_TRACT
  Filled 2024-02-15 (×8): qty 2

## 2024-02-15 MED ORDER — ACETAMINOPHEN 500 MG PO TABS
1000.0000 mg | ORAL_TABLET | Freq: Three times a day (TID) | ORAL | Status: DC | PRN
  Administered 2024-02-15 – 2024-02-18 (×2): 1000 mg via ORAL
  Filled 2024-02-15 (×2): qty 2

## 2024-02-15 MED ORDER — SODIUM CHLORIDE 0.9% IV SOLUTION
Freq: Once | INTRAVENOUS | Status: AC
  Administered 2024-02-15: 10:00:00 10 mL/h via INTRAVENOUS

## 2024-02-15 MED ORDER — CALCIUM GLUCONATE-NACL 2-0.675 GM/100ML-% IV SOLN
2.0000 g | Freq: Once | INTRAVENOUS | Status: AC
  Administered 2024-02-15: 11:00:00 2000 mg via INTRAVENOUS
  Filled 2024-02-15: qty 100

## 2024-02-15 NOTE — Progress Notes (Signed)
 eLink Physician-Brief Progress Note Patient Name: William Elliott DOB: 02-09-1941 MRN: [id number]   Date of Service  02/15/2024  HPI/Events of Note  LGIB - h/o diverticulosis Had another episode of BRBPR after arrival to ICU  eICU Interventions  Await H/h, hemodynamically stable GI consult to be called      Intervention Category Evaluation Type: New Patient Evaluation  Tasia Liz V. Lauris Serviss 02/15/2024, 6:35 AM

## 2024-02-15 NOTE — Progress Notes (Signed)
 PCCM Brief Progress Note   Patient had a large bloody bowel movement and dropped his blood pressure. 2 units of blood ordered. 1 liter of LR bolus ordered.    Zola Herter, MD [facility] Office: [phone]   See Amion for personal pager PCCM on call pager [phone] until 7pm. Please call Elink 7p-7a. [phone]

## 2024-02-15 NOTE — Plan of Care (Signed)
°  Problem: Education: Goal: Knowledge of General Education information will improve Description: Including pain rating scale, medication(s)/side effects and non-pharmacologic comfort measures Outcome: Progressing   Problem: Health Behavior/Discharge Planning: Goal: Ability to manage health-related needs will improve Outcome: Progressing   Problem: Clinical Measurements: Goal: Ability to maintain clinical measurements within normal limits will improve Outcome: Progressing Goal: Will remain free from infection Outcome: Progressing Goal: Cardiovascular complication will be avoided Outcome: Progressing   Problem: Activity: Goal: Risk for activity intolerance will decrease Outcome: Progressing   Problem: Coping: Goal: Level of anxiety will decrease Outcome: Progressing   Problem: Elimination: Goal: Will not experience complications related to bowel motility Outcome: Progressing Goal: Will not experience complications related to urinary retention Outcome: Progressing   Problem: Pain Managment: Goal: General experience of comfort will improve and/or be controlled Outcome: Progressing   Problem: Safety: Goal: Ability to remain free from injury will improve Outcome: Progressing   Problem: Skin Integrity: Goal: Risk for impaired skin integrity will decrease Outcome: Progressing

## 2024-02-15 NOTE — Progress Notes (Addendum)
 Upon further clarification with patient's son. Son found patient on the floor next to patient's bed at home. Possible he hit his head, denies LOC, but given concern for head injury will obtain CT head w/o. No focal neurologic findings.

## 2024-02-15 NOTE — TOC Initial Note (Signed)
 Transition of Care [facility]) - Initial/Assessment Note    Patient Details  Name: William Elliott MRN: [id number] Date of Birth: 06-04-1940  Transition of Care [facility]) CM/SW Contact:    Lauraine FORBES Saa, LCSWA Phone Number: 02/15/2024, 4:17 PM  Clinical Narrative:                  4:17 PM CSW introduced self and role to patient's son, [name]. [name] confirmed patient resides with him along with his spouse and patient's spouse. [name] stated he provides patient transportation to appointments. [name] confirmed patient does not have SNF or HH history but has DME (rollator) history with [employer]. [name] also stated patient has a stair lift through the [place] and that the [place] is also to install an elevator. [name] confirmed patient's PCP and insurance coverage. [name] also confirmed patient's preferred pharmacy's [facility] and [facility]). No TOC needs identified at this time. TOC will continue to follow.  Expected Discharge Plan: Home/Self Care Barriers to Discharge: Continued Medical Work up   Patient Goals and CMS Choice            Expected Discharge Plan and Services       Living arrangements for the past 2 months: Single Family Home                                      Prior Living Arrangements/Services Living arrangements for the past 2 months: Single Family Home Lives with:: Adult Children, Relatives Patient language and need for interpreter reviewed:: Yes            Current home services: DME Criminal Activity/Legal Involvement Pertinent to Current Situation/Hospitalization: No - Comment as needed  Activities of Daily Living      Permission Sought/Granted Permission sought to share information with : Family Supports Permission granted to share information with : No (Contact information on chart)  Share Information with NAME: Merlon Alcorta     Permission granted to share info w Relationship: Son  Permission granted to share info w  Contact Information: [phone]  Emotional Assessment         Alcohol / Substance Use: Not Applicable Psych Involvement: No (comment)  Admission diagnosis:  Hematochezia [K92.1] Elevated troponin [R79.89] Acute bleeding [R58] Pneumonia of both lower lobes due to infectious organism [J18.9] Patient Active Problem List   Diagnosis Date Noted   Acute bleeding 02/15/2024   Gastrointestinal hemorrhage 09/16/2023   Chronic atrial fibrillation (HCC) 09/14/2023   Acute on chronic systolic heart failure (HCC) 09/13/2023   Symptomatic anemia 09/12/2023   Hematochezia 01/20/2023   Lower GI bleed 01/19/2023   Acute GI bleeding 01/17/2023   ABLA (acute blood loss anemia) 01/17/2023   Diverticulosis 01/17/2023   Non-STEMI (non-ST elevated myocardial infarction) (HCC) 04/19/2020   Permanent atrial fibrillation (HCC) 04/19/2020   PAF (paroxysmal atrial fibrillation) (HCC) 12/10/2015   Aneurysm    Transient confusion    Cerebral thrombosis with cerebral infarction 12/08/2015   Cerebral embolism with cerebral infarction 12/08/2015   Intracerebral hemorrhage 12/08/2015   Confusion 12/07/2015   NSTEMI (non-ST elevated myocardial infarction) (HCC) 03/18/2015   Coronary artery disease involving coronary bypass graft of native heart with unstable angina pectoris (HCC)    ASCVD (arteriosclerotic cardiovascular disease) 03/17/2015   Crescendo angina (HCC) 03/16/2015   Essential hypertension 07/27/2014   Dyspnea 07/26/2014   Benign hypertensive heart disease without heart  failure 08/04/2013   BPH associated with nocturia 03/17/2011   Coronary artery disease involving native coronary artery with unstable angina pectoris (HCC)    Hyperlipidemia    Carotid bruit    Demand ischemia (HCC)    Dyslipidemia    COPD (chronic obstructive pulmonary disease) (HCC)    PCP:  [name], MD Pharmacy:   [facility] [place], [place] - [address] [place]  [address] Phone: [phone] Fax: [phone]  [facility] [address] [place] [address] Phone: [phone] Fax: [phone]     Social Drivers of Health (SDOH) Social History: SDOH Screenings   Food Insecurity: No Food Insecurity (09/13/2023)  Housing: Low Risk (09/13/2023)  Transportation Needs: No Transportation Needs (09/13/2023)  Utilities: Not At Risk (09/13/2023)  Alcohol Screen: Low Risk (09/14/2023)  Financial Resource Strain: Low Risk (09/14/2023)  Social Connections: Socially Integrated (09/13/2023)  Tobacco Use: Medium Risk (10/12/2023)   SDOH Interventions:     Readmission Risk Interventions    09/13/2023    3:37 PM 01/22/2023   11:58 AM  Readmission Risk Prevention Plan  Post Dischage Appt  Complete  Medication Screening  Complete  Transportation Screening Complete Complete  PCP or Specialist Appt within 5-7 Days Complete   Home Care Screening Complete   Medication Review (RN CM) Complete

## 2024-02-15 NOTE — Consult Note (Signed)
 Consultation  Referring Provider: CCm Meade Primary Care Physician:  [name], MD Primary Gastroenterologist:  Dr.Cirigliano / hospital only  Reason for Consultation: Lower GI bleed with hypotension  HPI: William Elliott is a 83 y.o. male with history of coronary artery disease, status post prior CABG 1999, and drug-eluting stent 2017.  NSTEMI July 2025 when he presented with acute lower GI bleeding.  Also with congestive heart failure, history of hypertension, prior CVA, hyperlipidemia, and COPD. Patient presented to the ER last night after he had acute onset of GI bleeding last evening with 2 fairly large-volume dark red bloody stools at home associated with weakness and dizziness without syncope.  He was brought to the emergency room.  He had no complaints of chest pain or shortness of breath, did complain of dizziness.  He has not had any abdominal pain or cramping. CTA was negative for evidence of acute bleed, there is left colonic diverticulosis noted without diverticulitis, stomach and duodenal sweep unremarkable, did note mild patchy bilateral lower lobe opacities favoring pneumonia. Chest x-ray shows stable mild cardiomegaly no frank interstitial edema, mild patchy bilateral lower lobe opacities atelectasis versus pneumonia.  Labs on arrival showed WBC of 20.4/hemoglobin 8.3/hematocrit 27.0 down from last hemoglobin in July of 9.4 hematocrit of 28.8 Sodium 138/potassium 4.5/BUN 29/creatinine 1.47/albumin 2.8 LFTs within normal limits Troponin 41> 58  Patient had 2 units packed RBCs overnight, hemoglobin at 6 AM was 9.4/hematocrit 29.1/WBC of 18 He is currently receiving third unit of packed RBCs.  This a.m. around 8:00 he had 2 large-volume grossly bloody stools each measuring about 500 cc and did have drop in blood pressure.  He is being given a liter of fluids.  He is not requiring pressors. Patient is mentating well, no complaints of chest pain or shortness of breath,  denies any abdominal pain or cramping.  Patient did not undergo endoscopic evaluation during his last hospitalization in July in the setting of NSTEMI, noted negative CTA, and low yield with finding culprit diverticulum on colonoscopy. He also had a similar presentation in 2024 which was managed conservatively and he did not have endoscopic evaluation. At the time of last admission we had discussed outpatient EGD and colonoscopy but does not look like he has scheduled to be seen in the office.  He has not been on any blood thinners, does take baby aspirin  daily  Past Medical History:  Diagnosis Date   Acute lower GI bleeding 2008   Anxiety    Asthma    Carotid artery disease    Asymptomatic left carotid bruit   COPD (chronic obstructive pulmonary disease) (HCC)    Coronary artery disease    a. CABG - 1999 with LIMA-LAD, SVG-DIAG, SVG-OM, SVG-RPDA  b. Cath in setting of NSTEMI 03/18/2015: patent LIMA-LAD, occluded SVG-RCA, occluded SVG-D1, 99% stenosis of SVG-OM treated w/ DES   Depression    Dyslipidemia    Erectile dysfunction    Essential hypertension    History of blood transfusion 2008   History of stroke    Hyperlipidemia    Myocardial infarction [facility])     Past Surgical History:  Procedure Laterality Date   CARDIAC CATHETERIZATION     CARDIAC CATHETERIZATION N/A 03/18/2015   Procedure: Left Heart Cath and Cors/Grafts Angiography;  Surgeon: Alm LELON Clay, MD;  Location: [facility] INVASIVE CV LAB;  Service: Cardiovascular;  Laterality: N/A;   CARDIAC CATHETERIZATION N/A 03/18/2015   Procedure: Coronary Stent Intervention;  Surgeon: Alm LELON Clay, MD;  Location: [facility] INVASIVE CV LAB;  Service: Cardiovascular;  Laterality: N/A;   CATARACT EXTRACTION W/ INTRAOCULAR LENS  IMPLANT, BILATERAL Bilateral    CORONARY ANGIOPLASTY     CORONARY ARTERY BYPASS GRAFT  1999   CORONARY STENT INTERVENTION N/A 04/22/2020   Procedure: CORONARY STENT INTERVENTION;  Surgeon: Claudene Victory ORN, MD;  Location:  [facility] INVASIVE CV LAB;  Service: Cardiovascular;  Laterality: N/A;   FRACTURE SURGERY     HERNIA REPAIR     LEFT HEART CATH AND CORS/GRAFTS ANGIOGRAPHY N/A 04/22/2020   Procedure: LEFT HEART CATH AND CORS/GRAFTS ANGIOGRAPHY;  Surgeon: Claudene Victory ORN, MD;  Location: [facility] INVASIVE CV LAB;  Service: Cardiovascular;  Laterality: N/A;   TIBIA FRACTURE SURGERY Left [facility]; took bone out of my right hip   TONSILLECTOMY     UMBILICAL HERNIA REPAIR  ~ 2000    Prior to Admission medications  Medication Sig Start Date End Date Taking? Authorizing Provider  acetaminophen  (TYLENOL ) 500 MG tablet Take 1,000 mg by mouth 2 (two) times daily as needed for moderate pain (pain score 4-6), fever or headache.    [provider]  albuterol  (VENTOLIN  HFA) 108 (90 Base) MCG/ACT inhaler Inhale 2 puffs into the lungs every 6 (six) hours as needed for wheezing or shortness of breath.    [provider]  ALPRAZolam  (XANAX ) 1 MG tablet Take 1 tablet (1 mg total) by mouth at bedtime as needed for anxiety. 11/01/16   Triplett, Tammy, PA-C  Ascorbic Acid  (VITAMIN C  PO) Take 1 tablet by mouth daily with breakfast.    [provider]  aspirin  EC 81 MG tablet Take 1 tablet (81 mg total) by mouth daily. 02/12/17   Debera Jayson MATSU, MD  B Complex-C (SUPER B COMPLEX PO) Take 1 capsule by mouth daily with breakfast.    [provider]  CHELATED ZINC  PO Take 1 tablet by mouth daily with breakfast.    [provider]  empagliflozin  (JARDIANCE ) 10 MG TABS tablet Take 1 tablet (10 mg total) by mouth daily. 10/07/23   Hayes Beckey CROME, NP  Ferrous Sulfate (IRON PO) Take 1 tablet by mouth daily with breakfast.    [provider]  furosemide  (LASIX ) 20 MG tablet Take 4 tablets (80 mg total) by mouth daily. 11/12/23   Lee, Jordan, NP  ipratropium-albuterol  (DUONEB) 0.5-2.5 (3) MG/3ML SOLN Take 3 mLs by nebulization every 4 (four) hours as needed (wheezing, shortness of breath).  03/11/23   [provider]  isosorbide  mononitrate (IMDUR ) 30 MG 24 hr tablet Take 0.5 tablets (15 mg total) by mouth at bedtime. 10/07/23   Hayes Beckey CROME, NP  losartan  (COZAAR ) 25 MG tablet Take 0.5 tablets (12.5 mg total) by mouth daily. 11/12/23   Lee, Jordan, NP  mexiletine (MEXITIL) 200 MG capsule TAKE ONE CAPSULE BY MOUTH TWICE DAILY 02/04/24   Marcine Catalan M, PA-C  Multiple Vitamins-Minerals (MENS 50+ MULTIVITAMIN) TABS Take 1 tablet by mouth daily with breakfast.    [provider]  NEXLIZET 180-10 MG TABS Take 1 tablet by mouth daily. 07/23/22   [provider]  nitroGLYCERIN  (NITROSTAT ) 0.4 MG SL tablet DISSOLVE 1 TABLET UNDER THE TONGUE EVERY 5 MINUTES AS NEEDED FOR CHEST PAIN. DO NOT EXCEED 3 DOSES IN 15 MIN. CALL 911 IF NO RELIEF 04/19/23   Debera Jayson MATSU, MD  Omega-3 Fatty Acids (FISH OIL PO) Take 1 capsule by mouth daily with breakfast.    [provider]  pantoprazole  (  PROTONIX ) 40 MG tablet TAKE 1 TABLET BY MOUTH EVERY DAY 02/09/19   Debera Jayson MATSU, MD  potassium chloride  SA (KLOR-CON  M) 20 MEQ tablet Take 20 mEq by mouth daily. Take with Lasix     [provider]  spironolactone  (ALDACTONE ) 25 MG tablet Take 1 tablet (25 mg total) by mouth daily. 10/14/23   Milford, Harlene HERO, FNP  Tiotropium Bromide -Olodaterol (STIOLTO RESPIMAT ) 2.5-2.5 MCG/ACT AERS Inhale 2 puffs into the lungs daily.    [provider]    Current Facility-Administered Medications  Medication Dose Route Frequency Provider Last Rate Last Admin   0.9 %  sodium chloride  infusion (Manually program via Guardrails IV Fluids)   Intravenous Once Stanek, Lawrence S, MD       0.9 %  sodium chloride  infusion (Manually program via Guardrails IV Fluids)   Intravenous Once Hattar, Zola SAILOR, MD       0.9 %  sodium chloride  infusion (Manually program via Guardrails IV Fluids)   Intravenous Once Hattar, Zola SAILOR, MD       0.9 %  sodium chloride  infusion (Manually program  via Guardrails IV Fluids)   Intravenous Once Hattar, Zola SAILOR, MD       Chlorhexidine  Gluconate Cloth 2 % PADS 6 each  6 each Topical Daily Layman Harlene, DO       docusate sodium  (COLACE) capsule 100 mg  100 mg Oral BID PRN Layman Harlene, DO       ondansetron  (ZOFRAN ) injection 4 mg  4 mg Intravenous Q6H PRN Layman Harlene, DO       Oral care mouth rinse  15 mL Mouth Rinse PRN Layman Harlene, DO       pantoprazole  (PROTONIX ) injection 40 mg  40 mg Intravenous Q12H Layman Harlene, DO   40 mg at 02/15/24 0456   polyethylene glycol (MIRALAX  / GLYCOLAX ) packet 17 g  17 g Oral Daily PRN Layman Harlene, DO        Allergies as of 02/14/2024 - Review Complete 02/14/2024  Allergen Reaction Noted   Anoro ellipta  [umeclidinium-vilanterol] Other (See Comments) 03/17/2015   Lipitor [atorvastatin calcium ] Other (See Comments) 06/27/2010   Niaspan [niacin] Other (See Comments) 07/03/2010   Tricor [fenofibrate] Other (See Comments) 09/12/2023   Zocor [simvastatin] Other (See Comments) 07/03/2010    Family History  Problem Relation Age of Onset   Heart disease Father    Heart attack Father    Hypertension Mother    Stroke Mother     Social History   Socioeconomic History   Marital status: Married    Spouse name: Not on file   Number of children: Not on file   Years of education: Not on file   Highest education level: Not on file  Occupational History   Occupation: Welder x 10 yrs   Tobacco Use   Smoking status: Former    Types: Cigars    Quit date: 03/02/1997    Years since quitting: 26.9    Passive exposure: Never   Smokeless tobacco: Never   Tobacco comments:    smoked cigars x 10 yrs, never smoked cigarettes  Vaping Use   Vaping status: Never Used  Substance and Sexual Activity   Alcohol use: No    Alcohol/week: 0.0 standard drinks of alcohol    Comment: drank some when I was young; quit in the 1990s   Drug use: No   Sexual activity: Not Currently  Other  Topics Concern   Not on file  Social History Narrative  Not on file   Social Drivers of Health   Tobacco Use: Medium Risk (10/12/2023)   Patient History    Smoking Tobacco Use: Former    Smokeless Tobacco Use: Never    Passive Exposure: Never  Physicist, Medical Strain: Low Risk (09/14/2023)   Overall Financial Resource Strain (CARDIA)    Difficulty of Paying Living Expenses: Not very hard  Food Insecurity: No Food Insecurity (09/13/2023)   Epic    Worried About Programme Researcher, Broadcasting/film/video in the Last Year: Never true    Ran Out of Food in the Last Year: Never true  Transportation Needs: No Transportation Needs (09/13/2023)   Epic    Lack of Transportation (Medical): No    Lack of Transportation (Non-Medical): No  Physical Activity: Not on file  Stress: Not on file  Social Connections: Socially Integrated (09/13/2023)   Social Connection and Isolation Panel    Frequency of Communication with Friends and Family: More than three times a week    Frequency of Social Gatherings with Friends and Family: More than three times a week    Attends Religious Services: More than 4 times per year    Active Member of [employer] or Organizations: Yes    Attends Banker Meetings: More than 4 times per year    Marital Status: Married  Catering Manager Violence: Not At Risk (09/13/2023)   Epic    Fear of Current or Ex-Partner: No    Emotionally Abused: No    Physically Abused: No    Sexually Abused: No  Depression (PHQ2-9): Not on file  Alcohol Screen: Low Risk (09/14/2023)   Alcohol Screen    Last Alcohol Screening Score (AUDIT): 0  Housing: Low Risk (09/13/2023)   Epic    Unable to Pay for Housing in the Last Year: No    Number of Times Moved in the Last Year: 1    Homeless in the Last Year: No  Utilities: Not At Risk (09/13/2023)   Epic    Threatened with loss of utilities: No  Health Literacy: Not on file    Review of Systems: Pertinent positive and negative review of systems were  noted in the above HPI section.  All other review of systems was otherwise negative.   Physical Exam: Vital signs in last 24 hours: Temp:  [97.5 F (36.4 C)-98.7 F (37.1 C)] 98.7 F (37.1 C) (12/16 0745) Pulse Rate:  [53-74] 64 (12/16 0730) Resp:  [17-30] 18 (12/16 0730) BP: (88-139)/(50-69) 100/55 (12/16 0730) SpO2:  [97 %-100 %] 97 % (12/16 0730) Weight:  [68 kg-92.6 kg] 92.6 kg (12/16 0500) Last BM Date : 02/15/24 General:   Alert,  Well-developed, well-nourished,thin elderly WM  pleasant and cooperative in NAD, son at bedside- color good Head:  Normocephalic and atraumatic. Eyes:  Sclera clear, no icterus.   Conjunctiva pink. Ears:  Normal auditory acuity. Nose:  No deformity, discharge,  or lesions. Mouth:  No deformity or lesions.   Neck:  Supple; no masses or thyromegaly. Lungs:  Clear throughout to auscultation.   No wheezes, crackles, or rhonchi.  Heart:  Regular rate and rhythm; no murmurs, clicks, rubs,  or gallops. Abdomen:  Soft,nontender, BS hyperactive , no mass or HSM  Rectal:  not done - pt had witnessed one liter of red blood per rectum around 8 am   Msk:  Symmetrical without gross deformities. . Pulses:  Normal pulses noted. Extremities:  Without clubbing or edema. Neurologic:  Alert and  oriented x4;  grossly normal neurologically. Skin:  Intact without significant lesions or rashes.. Psych:  Alert and cooperative. Normal mood and affect.  Intake/Output from previous day: 12/15 0701 - 12/16 0700 In: 2554 [I.V.:820; Blood:634; IV Piggyback:1100] Out: -  Intake/Output this shift: Total I/O In: 310 [Blood:310] Out: 1000 [Stool:1000]  Lab Results: Recent Labs    02/14/24 2348 02/14/24 2355 02/15/24 0611  WBC 20.4*  --  18.0*  HGB 8.3* 9.2* 9.4*  HCT 27.0* 27.0* 29.1*  PLT 308  --  264   BMET Recent Labs    02/14/24 2348 02/14/24 2355 02/15/24 0611  NA 138 140 140  K 4.5 4.7 4.9  CL 105 105 105  CO2 24  --  22  GLUCOSE 121* 116* 126*  BUN  29* 32* 28*  CREATININE 1.47* 1.60* 1.34*  CALCIUM  8.1*  --  8.4*   LFT Recent Labs    02/14/24 2348  PROT 5.3*  ALBUMIN 2.8*  AST 35  ALT 15  ALKPHOS 56  BILITOT 1.3*   PT/INR Recent Labs    02/15/24 0155  LABPROT 17.3*  INR 1.3*   Hepatitis Panel No results for input(s): HEPBSAG, HCVAB, HEPAIGM, HEPBIGM in the last 72 hours.    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 CTA late last night negative for acute bleed, stomach and duodenum unremarkable, left-sided diverticulosis noted  He has had 3-4 further episodes of bleeding 2 of those large-volume around 8 AM with drop in blood pressure. 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  Congestive heart failure  Plan; discussed with critical care, sent for repeat stat CTA now if positive then directly onto IR for embolization.  This was also discussed with patient and his son at bedside and they are in agreement. If CTA is negative then we can plan for a bowel prep and attempt at colonoscopy versus continue to manage supportively depending on his course throughout the day today. Hemoglobins every 6 hours and keep hemoglobin 8 given his advanced age and coronary artery  disease Bedrest today GI will follow with you   Eh Sesay EsterwoodPA-C  02/15/2024, 9:13 AM

## 2024-02-15 NOTE — Plan of Care (Signed)
  Problem: Education: Goal: Knowledge of General Education information will improve Description: Including pain rating scale, medication(s)/side effects and non-pharmacologic comfort measures Outcome: Progressing   Problem: Clinical Measurements: Goal: Ability to maintain clinical measurements within normal limits will improve Outcome: Progressing   Problem: Activity: Goal: Risk for activity intolerance will decrease Outcome: Progressing   Problem: Nutrition: Goal: Adequate nutrition will be maintained Outcome: Progressing   Problem: Coping: Goal: Level of anxiety will decrease Outcome: Progressing   Problem: Pain Managment: Goal: General experience of comfort will improve and/or be controlled Outcome: Progressing   Problem: Safety: Goal: Ability to remain free from injury will improve Outcome: Progressing   Problem: Skin Integrity: Goal: Risk for impaired skin integrity will decrease Outcome: Progressing

## 2024-02-15 NOTE — Progress Notes (Addendum)
 NAME:  William Elliott, MRN:  [id number], DOB:  Jun 05, 1940, LOS: 0 ADMISSION DATE:  02/14/2024, CONSULTATION DATE:  02/15/24 REFERRING MD:  EDP, CHIEF COMPLAINT:  GIB  History of Present Illness:  83 yo male presented after acute GIB starting around 1700 12/15. Pt had bowel movement that was unremarkable at that time but shortly after around 2145 pt was awoken with another large but this time bloody stool. Pt was brought in by EMS.    Son is at bedside and states that pt has had 2 bouts of ~2L clots and stool today. He has been weak since events and req assistance to stand. Son was called by his mother to come assist his father after being found in the shower attempting to clean himself after the first episode of bloody bm.    Pt denies pain, no n/v, no cp/sob. Mild dizziness since 2nd episode upon standing. States this is not the first time he has had large GIB, states he was previously told that he is diverticulosis and his bleeding is likely from that. Most recently admitted  in 08/2023 and 01/2023 with same. His chronic a/c has been long discontinued prior to 2023   Pertinent  Medical History  H/o GIB Cad s/p DES and CABG Chronic afib HTN Hyperlipidemia  H/o cva  Significant Hospital Events: Including procedures, antibiotic start and stop dates in addition to other pertinent events   Admit 12/16 for LGIB and hemorrhagic shock  Interim History / Subjective:  Just had bloody BM. BP dropped acutely. On 3rd unit PRBC. States hematochezia started yesterday. Was hospitalized for this previously, no longer on AC except daily ASA 81 mg. Had normal BM until yesterday. Son at bedside.   Objective    Blood pressure (!) 122/55, pulse (!) 53, temperature 97.9 F (36.6 C), temperature source Axillary, resp. rate 18, height 5' 10 (1.778 m), weight 92.6 kg, SpO2 97%.        Intake/Output Summary (Last 24 hours) at 02/15/2024 0709 Last data filed at 02/15/2024 0515 Gross per 24 hour  Intake  2554 ml  Output --  Net 2554 ml   Filed Weights   02/14/24 2344 02/15/24 0500  Weight: 68 kg 92.6 kg    Examination: General: awake, pallor, in no acute distress Lungs: normal effort on room air, anterior lung sounds clear Cardiovascular: RRR, systolic murmur LSB Abdomen: bowel sounds active, soft, NT, ND Extremities: no LE edema Neuro: A&Ox3  Resolved problem list   Assessment and Plan  Neuro No acute issues. Awake and alert, answering questions appropriately.   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 On Stiolto but reports allergy to Anoro. Will start Yupelri  and Brovana  nebs while here.   B/l Infiltrates: Received CTX in ED. CXR w/ b/l opacities, atelectasis vs PNA. Will continue CTX for total 5 days.   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 Hold off GDMT due to low normotension w/ acute GIB. Hold ASA at this time.   Hemorrhagic shock  Receiving PRBC (got 3 units already) and 1L LR bolus. BP improved.   GI LGIB w/ hematochezia Diverticulosis  Still active w/ bloody BM this AM. GI consulted and saw him. Hgb 9.4 from 8.3 s/p 2 PRBC. Prior admission suspect diverticular bleed.  -S/p 2 unit PRBC, on 3rd unit w/ LR bolus. Ordered 4th along with FFP and Plt.  -2 x PIV -IV PPI BID  -Daily PT-INR -CBC Q6H -Discussed w/ GI this AM, plan repeat CT  GI Bleed since still bleeding, if positive then IR for embolization -If negative then plan for prep and scope later today per GI   ID Leukocytosis: 18 (20). Afebrile. Received CTX in ED. CXR w/ b/l infiltrates, atelectasis vs PNA. No new cough or fever per family. No sick contacts.  -Trend CBC and fever curve  -F/u Bcx  -Continue CTX as above  Endo  No hx of DM. CBG stable in 110s.   Renal AKI Scr 1.47 on admission. Last Scr 1.29 w/ adjustments of his GDMT. No hx of CKD. Improving to 1.34.  -Trend renal function -Monitor urine output   Heme See above for LGIB.   Anticoagulation: No  pharmacologic due to GIB, SCDs for DVT ppx   Other Lines: 2 x PIV GOC: full   Labs   CBC: Recent Labs  Lab 02/14/24 2348 02/14/24 2355 02/15/24 0611  WBC 20.4*  --  18.0*  NEUTROABS 18.6*  --   --   HGB 8.3* 9.2* 9.4*  HCT 27.0* 27.0* 29.1*  MCV 88.2  --  84.8  PLT 308  --  264    Basic Metabolic Panel: Recent Labs  Lab 02/14/24 2348 02/14/24 2355 02/15/24 0611  NA 138 140 140  K 4.5 4.7 4.9  CL 105 105 105  CO2 24  --  22  GLUCOSE 121* 116* 126*  BUN 29* 32* 28*  CREATININE 1.47* 1.60* 1.34*  CALCIUM  8.1*  --  8.4*  MG  --   --  2.1   GFR: Estimated Creatinine Clearance: 49.4 mL/min (A) (by C-G formula based on SCr of 1.34 mg/dL (H)). Recent Labs  Lab 02/14/24 2348 02/15/24 0210 02/15/24 0611  WBC 20.4*  --  18.0*  LATICACIDVEN  --  1.7  --     Liver Function Tests: Recent Labs  Lab 02/14/24 2348  AST 35  ALT 15  ALKPHOS 56  BILITOT 1.3*  PROT 5.3*  ALBUMIN 2.8*   No results for input(s): LIPASE, AMYLASE in the last 168 hours. No results for input(s): AMMONIA in the last 168 hours.  ABG    Component Value Date/Time   TCO2 24 02/14/2024 2355     Coagulation Profile: Recent Labs  Lab 02/15/24 0155  INR 1.3*    Cardiac Enzymes: No results for input(s): CKTOTAL, CKMB, CKMBINDEX, TROPONINI in the last 168 hours.  HbA1C: Hgb A1c MFr Bld  Date/Time Value Ref Range Status  12/09/2015 06:16 AM 5.5 4.8 - 5.6 % Final    Comment:    (NOTE)         Pre-diabetes: 5.7 - 6.4         Diabetes: >6.4         Glycemic control for adults with diabetes: <7.0     CBG: Recent Labs  Lab 02/15/24 0422  GLUCAP 114*    Review of Systems:   As above   Past Medical History:  He,  has a past medical history of Acute lower GI bleeding (2008), Anxiety, Asthma, Carotid artery disease, COPD (chronic obstructive pulmonary disease) (HCC), Coronary artery disease, Depression, Dyslipidemia, Erectile dysfunction, Essential hypertension,  History of blood transfusion (2008), History of stroke, Hyperlipidemia, and Myocardial infarction (HCC).   Surgical History:   Past Surgical History:  Procedure Laterality Date   CARDIAC CATHETERIZATION     CARDIAC CATHETERIZATION N/A 03/18/2015   Procedure: Left Heart Cath and Cors/Grafts Angiography;  Surgeon: Alm LELON Clay, MD;  Location: [facility] INVASIVE CV LAB;  Service: Cardiovascular;  Laterality:  N/A;   CARDIAC CATHETERIZATION N/A 03/18/2015   Procedure: Coronary Stent Intervention;  Surgeon: Alm LELON Clay, MD;  Location: [facility] INVASIVE CV LAB;  Service: Cardiovascular;  Laterality: N/A;   CATARACT EXTRACTION W/ INTRAOCULAR LENS  IMPLANT, BILATERAL Bilateral    CORONARY ANGIOPLASTY     CORONARY ARTERY BYPASS GRAFT  1999   CORONARY STENT INTERVENTION N/A 04/22/2020   Procedure: CORONARY STENT INTERVENTION;  Surgeon: Claudene Victory LELON, MD;  Location: [facility] INVASIVE CV LAB;  Service: Cardiovascular;  Laterality: N/A;   FRACTURE SURGERY     HERNIA REPAIR     LEFT HEART CATH AND CORS/GRAFTS ANGIOGRAPHY N/A 04/22/2020   Procedure: LEFT HEART CATH AND CORS/GRAFTS ANGIOGRAPHY;  Surgeon: Claudene Victory LELON, MD;  Location: [facility] INVASIVE CV LAB;  Service: Cardiovascular;  Laterality: N/A;   TIBIA FRACTURE SURGERY Left [facility]; took bone out of my right hip   TONSILLECTOMY     UMBILICAL HERNIA REPAIR  ~ 2000     Social History:   reports that he quit smoking about 26 years ago. His smoking use included cigars. He has never been exposed to tobacco smoke. He has never used smokeless tobacco. He reports that he does not drink alcohol and does not use drugs.   Family History:  His family history includes Heart attack in his father; Heart disease in his father; Hypertension in his mother; Stroke in his mother.   Allergies Allergies[1]   Home Medications  Prior to Admission medications  Medication Sig Start Date End Date Taking? Authorizing Provider  acetaminophen  (TYLENOL ) 500 MG tablet  Take 1,000 mg by mouth 2 (two) times daily as needed for moderate pain (pain score 4-6), fever or headache.    [provider]  albuterol  (VENTOLIN  HFA) 108 (90 Base) MCG/ACT inhaler Inhale 2 puffs into the lungs every 6 (six) hours as needed for wheezing or shortness of breath.    [provider]  ALPRAZolam  (XANAX ) 1 MG tablet Take 1 tablet (1 mg total) by mouth at bedtime as needed for anxiety. 11/01/16   Triplett, Tammy, PA-C  Ascorbic Acid  (VITAMIN C  PO) Take 1 tablet by mouth daily with breakfast.    [provider]  aspirin  EC 81 MG tablet Take 1 tablet (81 mg total) by mouth daily. 02/12/17   Debera Jayson MATSU, MD  B Complex-C (SUPER B COMPLEX PO) Take 1 capsule by mouth daily with breakfast.    [provider]  CHELATED ZINC  PO Take 1 tablet by mouth daily with breakfast.    [provider]  empagliflozin  (JARDIANCE ) 10 MG TABS tablet Take 1 tablet (10 mg total) by mouth daily. 10/07/23   Hayes Beckey CROME, NP  Ferrous Sulfate (IRON PO) Take 1 tablet by mouth daily with breakfast.    [provider]  furosemide  (LASIX ) 20 MG tablet Take 4 tablets (80 mg total) by mouth daily. 11/12/23   Lee, Jordan, NP  ipratropium-albuterol  (DUONEB) 0.5-2.5 (3) MG/3ML SOLN Take 3 mLs by nebulization every 4 (four) hours as needed (wheezing, shortness of breath). 03/11/23   [provider]  isosorbide  mononitrate (IMDUR ) 30 MG 24 hr tablet Take 0.5 tablets (15 mg total) by mouth at bedtime. 10/07/23   Hayes Beckey CROME, NP  losartan  (COZAAR ) 25 MG tablet Take 0.5 tablets (12.5 mg total) by mouth daily. 11/12/23   Lee, Jordan, NP  mexiletine (MEXITIL) 200 MG capsule TAKE ONE CAPSULE BY MOUTH TWICE DAILY 02/04/24   Marcine Caffie HERO, PA-C  Multiple Vitamins-Minerals (MENS 50+ MULTIVITAMIN) TABS Take 1 tablet by mouth daily with breakfast.    [provider]  NEXLIZET 180-10 MG TABS Take 1 tablet by mouth daily. 07/23/22   [provider]   nitroGLYCERIN  (NITROSTAT ) 0.4 MG SL tablet DISSOLVE 1 TABLET UNDER THE TONGUE EVERY 5 MINUTES AS NEEDED FOR CHEST PAIN. DO NOT EXCEED 3 DOSES IN 15 MIN. CALL 911 IF NO RELIEF 04/19/23   Debera Jayson MATSU, MD  Omega-3 Fatty Acids (FISH OIL PO) Take 1 capsule by mouth daily with breakfast.    [provider]  pantoprazole  (PROTONIX ) 40 MG tablet TAKE 1 TABLET BY MOUTH EVERY DAY 02/09/19   Debera Jayson MATSU, MD  potassium chloride  SA (KLOR-CON  M) 20 MEQ tablet Take 20 mEq by mouth daily. Take with Lasix     [provider]  spironolactone  (ALDACTONE ) 25 MG tablet Take 1 tablet (25 mg total) by mouth daily. 10/14/23   Milford, Harlene HERO, FNP  Tiotropium Bromide -Olodaterol (STIOLTO RESPIMAT ) 2.5-2.5 MCG/ACT AERS Inhale 2 puffs into the lungs daily.    [provider]     Critical care time:      Signature: Ozell Nearing, DO Internal Medicine Resident PGY-3       [1]  Allergies Allergen Reactions   Anoro Ellipta  [Umeclidinium-Vilanterol] Other (See Comments)    Cardiac problems   Lipitor [Atorvastatin Calcium ] Other (See Comments)    Unknown reaction   Niaspan [Niacin] Other (See Comments)    Unknown reaction   Tricor [Fenofibrate] Other (See Comments)    Fatigue    Zocor [Simvastatin] Other (See Comments)    Unknown reaction

## 2024-02-15 NOTE — H&P (Signed)
 NAME:  William Elliott, MRN:  [id number], DOB:  August 30, 1940, LOS: 0 ADMISSION DATE:  02/14/2024, CONSULTATION DATE:  02/15/24 REFERRING MD:  EDP, CHIEF COMPLAINT:  GIB   History of Present Illness:  83 yo male presented after acute GIB starting around 1700 12/15. Pt had bowel movement that was unremarkable at that time but shortly after around 2145 pt was awoken with another large but this time bloody stool. Pt was brought in by EMS.   Son is at bedside and states that pt has had 2 bouts of ~2L clots and stool today. He has been weak since events and req assistance to stand. Son was called by his mother to come assist his father after being found in the shower attempting to clean himself after the first episode of bloody bm.   Pt denies pain, no n/v, no cp/sob. Mild dizziness since 2nd episode upon standing. States this is not the first time he has had large GIB, states he was previously told that he is diverticulosis and his bleeding is likely from that. Most recently admitted  in 08/2023 and 01/2023 with same. His chronic a/c has been long discontinued prior to 2023  Pertinent  Medical History  H/o GIB Cad s/p DES and CABG Chronic afib HTN Hyperlipidemia  H/o cva   Significant Hospital Events: Including procedures, antibiotic start and stop dates in addition to other pertinent events   Admitted to ICU 12/16  Interim History / Subjective:    Objective    Blood pressure 92/62, pulse 63, temperature 98 F (36.7 C), temperature source Oral, resp. rate 20, height 5' 10 (1.778 m), weight 68 kg, SpO2 98%.        Intake/Output Summary (Last 24 hours) at 02/15/2024 0253 Last data filed at 02/15/2024 0250 Gross per 24 hour  Intake 1920 ml  Output --  Net 1920 ml   Filed Weights   02/14/24 2344  Weight: 68 kg    Examination: General: nad, pale skin, laying supine in bed HENT: ncat, eomi, perrla, mmm and pale, conjunctiva pale Lungs: ctab Cardiovascular: irreg  irreg Abdomen: soft nt,nd bs+ Extremities: no c/c/e, dried blood noted lower extremities Neuro: no focal deficits appreciated GU: deferred  Resolved problem list   Assessment and Plan   Acute GIB Hypotension 2/2 above ABLA 2/2 above H/o GIB, h/o chronic a/c use  H/o htn Hyperlipidemia Afib not on chronic a/c Diverticulosis Chronic L and R HFrEF without acute exacerbation -monitor H&H -transfuse <7 or with further bleeding -transfuse plt <50  -coags acceptable -monitor BP, at this time no indication for vasopressor -lactate normal -GI consult in am -bid ppi for now  -  Labs   CBC: Recent Labs  Lab 02/14/24 2348 02/14/24 2355  WBC 20.4*  --   NEUTROABS 18.6*  --   HGB 8.3* 9.2*  HCT 27.0* 27.0*  MCV 88.2  --   PLT 308  --     Basic Metabolic Panel: Recent Labs  Lab 02/14/24 2348 02/14/24 2355  NA 138 140  K 4.5 4.7  CL 105 105  CO2 24  --   GLUCOSE 121* 116*  BUN 29* 32*  CREATININE 1.47* 1.60*  CALCIUM  8.1*  --    GFR: Estimated Creatinine Clearance: 34.8 mL/min (A) (by C-G formula based on SCr of 1.6 mg/dL (H)). Recent Labs  Lab 02/14/24 2348 02/15/24 0210  WBC 20.4*  --   LATICACIDVEN  --  1.7    Liver Function Tests: Recent Labs  Lab 02/14/24 2348  AST 35  ALT 15  ALKPHOS 56  BILITOT 1.3*  PROT 5.3*  ALBUMIN 2.8*   No results for input(s): LIPASE, AMYLASE in the last 168 hours. No results for input(s): AMMONIA in the last 168 hours.  ABG    Component Value Date/Time   TCO2 24 02/14/2024 2355     Coagulation Profile: Recent Labs  Lab 02/15/24 0155  INR 1.3*    Cardiac Enzymes: No results for input(s): CKTOTAL, CKMB, CKMBINDEX, TROPONINI in the last 168 hours.  HbA1C: Hgb A1c MFr Bld  Date/Time Value Ref Range Status  12/09/2015 06:16 AM 5.5 4.8 - 5.6 % Final    Comment:    (NOTE)         Pre-diabetes: 5.7 - 6.4         Diabetes: >6.4         Glycemic control for adults with diabetes: <7.0      CBG: No results for input(s): GLUCAP in the last 168 hours.  Review of Systems:   As per HPI  Past Medical History:  He,  has a past medical history of Acute lower GI bleeding (2008), Anxiety, Asthma, Carotid artery disease, COPD (chronic obstructive pulmonary disease) (HCC), Coronary artery disease, Depression, Dyslipidemia, Erectile dysfunction, Essential hypertension, History of blood transfusion (2008), History of stroke, Hyperlipidemia, and Myocardial infarction (HCC).   Surgical History:   Past Surgical History:  Procedure Laterality Date   CARDIAC CATHETERIZATION     CARDIAC CATHETERIZATION N/A 03/18/2015   Procedure: Left Heart Cath and Cors/Grafts Angiography;  Surgeon: Alm LELON Clay, MD;  Location: [facility] INVASIVE CV LAB;  Service: Cardiovascular;  Laterality: N/A;   CARDIAC CATHETERIZATION N/A 03/18/2015   Procedure: Coronary Stent Intervention;  Surgeon: Alm LELON Clay, MD;  Location: [facility] INVASIVE CV LAB;  Service: Cardiovascular;  Laterality: N/A;   CATARACT EXTRACTION W/ INTRAOCULAR LENS  IMPLANT, BILATERAL Bilateral    CORONARY ANGIOPLASTY     CORONARY ARTERY BYPASS GRAFT  1999   CORONARY STENT INTERVENTION N/A 04/22/2020   Procedure: CORONARY STENT INTERVENTION;  Surgeon: Claudene Victory LELON, MD;  Location: [facility] INVASIVE CV LAB;  Service: Cardiovascular;  Laterality: N/A;   FRACTURE SURGERY     HERNIA REPAIR     LEFT HEART CATH AND CORS/GRAFTS ANGIOGRAPHY N/A 04/22/2020   Procedure: LEFT HEART CATH AND CORS/GRAFTS ANGIOGRAPHY;  Surgeon: Claudene Victory LELON, MD;  Location: [facility] INVASIVE CV LAB;  Service: Cardiovascular;  Laterality: N/A;   TIBIA FRACTURE SURGERY Left [facility]; took bone out of my right hip   TONSILLECTOMY     UMBILICAL HERNIA REPAIR  ~ 2000     Social History:   reports that he quit smoking about 26 years ago. His smoking use included cigars. He has never been exposed to tobacco smoke. He has never used smokeless tobacco. He reports that he does  not drink alcohol and does not use drugs.   Family History:  His family history includes Heart attack in his father; Heart disease in his father; Hypertension in his mother; Stroke in his mother.   Allergies Allergies[1]   Home Medications  Prior to Admission medications  Medication Sig Start Date End Date Taking? Authorizing Provider  acetaminophen  (TYLENOL ) 500 MG tablet Take 1,000 mg by mouth 2 (two) times daily as needed for moderate pain (pain score 4-6), fever or headache.    [provider]  albuterol  (VENTOLIN  HFA) 108 (90 Base) MCG/ACT inhaler Inhale 2 puffs into the  lungs every 6 (six) hours as needed for wheezing or shortness of breath.    [provider]  ALPRAZolam  (XANAX ) 1 MG tablet Take 1 tablet (1 mg total) by mouth at bedtime as needed for anxiety. 11/01/16   Triplett, Tammy, PA-C  Ascorbic Acid  (VITAMIN C  PO) Take 1 tablet by mouth daily with breakfast.    [provider]  aspirin  EC 81 MG tablet Take 1 tablet (81 mg total) by mouth daily. 02/12/17   Debera Jayson MATSU, MD  B Complex-C (SUPER B COMPLEX PO) Take 1 capsule by mouth daily with breakfast.    [provider]  CHELATED ZINC  PO Take 1 tablet by mouth daily with breakfast.    [provider]  empagliflozin  (JARDIANCE ) 10 MG TABS tablet Take 1 tablet (10 mg total) by mouth daily. 10/07/23   Hayes Beckey CROME, NP  Ferrous Sulfate (IRON PO) Take 1 tablet by mouth daily with breakfast.    [provider]  furosemide  (LASIX ) 20 MG tablet Take 4 tablets (80 mg total) by mouth daily. 11/12/23   Lee, Jordan, NP  ipratropium-albuterol  (DUONEB) 0.5-2.5 (3) MG/3ML SOLN Take 3 mLs by nebulization every 4 (four) hours as needed (wheezing, shortness of breath). 03/11/23   [provider]  isosorbide  mononitrate (IMDUR ) 30 MG 24 hr tablet Take 0.5 tablets (15 mg total) by mouth at bedtime. 10/07/23   Hayes Beckey CROME, NP  losartan  (COZAAR ) 25 MG tablet Take 0.5 tablets (12.5 mg total)  by mouth daily. 11/12/23   Lee, Jordan, NP  mexiletine (MEXITIL) 200 MG capsule TAKE ONE CAPSULE BY MOUTH TWICE DAILY 02/04/24   Marcine Catalan M, PA-C  Multiple Vitamins-Minerals (MENS 50+ MULTIVITAMIN) TABS Take 1 tablet by mouth daily with breakfast.    [provider]  NEXLIZET 180-10 MG TABS Take 1 tablet by mouth daily. 07/23/22   [provider]  nitroGLYCERIN  (NITROSTAT ) 0.4 MG SL tablet DISSOLVE 1 TABLET UNDER THE TONGUE EVERY 5 MINUTES AS NEEDED FOR CHEST PAIN. DO NOT EXCEED 3 DOSES IN 15 MIN. CALL 911 IF NO RELIEF 04/19/23   Debera Jayson MATSU, MD  Omega-3 Fatty Acids (FISH OIL PO) Take 1 capsule by mouth daily with breakfast.    [provider]  pantoprazole  (PROTONIX ) 40 MG tablet TAKE 1 TABLET BY MOUTH EVERY DAY 02/09/19   Debera Jayson MATSU, MD  potassium chloride  SA (KLOR-CON  M) 20 MEQ tablet Take 20 mEq by mouth daily. Take with Lasix     [provider]  spironolactone  (ALDACTONE ) 25 MG tablet Take 1 tablet (25 mg total) by mouth daily. 10/14/23   Milford, Harlene HERO, FNP  Tiotropium Bromide -Olodaterol (STIOLTO RESPIMAT ) 2.5-2.5 MCG/ACT AERS Inhale 2 puffs into the lungs daily.    [provider]     Critical care time:               [1]  Allergies Allergen Reactions   Anoro Ellipta  [Umeclidinium-Vilanterol] Other (See Comments)    Cardiac problems   Lipitor [Atorvastatin Calcium ] Other (See Comments)    Unknown reaction   Niaspan [Niacin] Other (See Comments)    Unknown reaction   Tricor [Fenofibrate] Other (See Comments)    Fatigue    Zocor [Simvastatin] Other (See Comments)    Unknown reaction

## 2024-02-15 NOTE — Progress Notes (Signed)
 Paged for critical results from CT angio GI. Acute bleeding near hepatic flexure, noted diverticula there. Discussed earlier this AM with GI that plan is if CT positive for acute bleed then consult IR. Called and discussed case with IR (Dr. Jenna). IR eval & management order placed as well. Patient remains NPO. GI notified and aware of results, agrees with plan.

## 2024-02-15 NOTE — Consult Note (Signed)
 Chief Complaint: Patient was seen in consultation today for  Chief Complaint  Patient presents with   GI Bleeding   Referring Physician(s):  Supervising Physician: Jenna Hacker  Patient Status: William Elliott - In-pt  History of Present Illness: William Elliott is a 83 y.o. male with a medical history significant for CAD with stent, CABG, NSTEMI, heart failure, HTN, COPD, CVA and lower GI bleed.   Patient presented to the ED last night with acute onset GI bleed with several large volume bloody bowel movements at home. In the ED he was weak, dizzy and found to have a hemoglobin on 8.3. CTA was negative for a bleed but showed left colonic diverticulosis without diverticulitis. He received some PRBCs and fluids but continued to have bloody bowel movements. GI was consulted and a repeat CTA was ordered. This showed an active GI bleed along the hepatic flexure of the colon.   Interventional Radiology has been asked to evaluate this patient for an image-guided mesenteric angiogram with possible intervention. Imaging reviewed by Dr. Jenna.   Past Medical History:  Diagnosis Date   Acute lower GI bleeding 2008   Anxiety    Asthma    Carotid artery disease    Asymptomatic left carotid bruit   COPD (chronic obstructive pulmonary disease) (HCC)    Coronary artery disease    a. CABG - 1999 with LIMA-LAD, SVG-DIAG, SVG-OM, SVG-RPDA  b. Cath in setting of NSTEMI 03/18/2015: patent LIMA-LAD, occluded SVG-RCA, occluded SVG-D1, 99% stenosis of SVG-OM treated w/ DES   Depression    Dyslipidemia    Erectile dysfunction    Essential hypertension    History of blood transfusion 2008   History of stroke    Hyperlipidemia    Myocardial infarction [facility])     Past Surgical History:  Procedure Laterality Date   CARDIAC CATHETERIZATION     CARDIAC CATHETERIZATION N/A 03/18/2015   Procedure: Left Heart Cath and Cors/Grafts Angiography;  Surgeon: Alm LELON Clay, MD;  Location: [facility] INVASIVE CV LAB;   Service: Cardiovascular;  Laterality: N/A;   CARDIAC CATHETERIZATION N/A 03/18/2015   Procedure: Coronary Stent Intervention;  Surgeon: Alm LELON Clay, MD;  Location: [facility] INVASIVE CV LAB;  Service: Cardiovascular;  Laterality: N/A;   CATARACT EXTRACTION W/ INTRAOCULAR LENS  IMPLANT, BILATERAL Bilateral    CORONARY ANGIOPLASTY     CORONARY ARTERY BYPASS GRAFT  1999   CORONARY STENT INTERVENTION N/A 04/22/2020   Procedure: CORONARY STENT INTERVENTION;  Surgeon: Claudene Victory LELON, MD;  Location: [facility] INVASIVE CV LAB;  Service: Cardiovascular;  Laterality: N/A;   FRACTURE SURGERY     HERNIA REPAIR     LEFT HEART CATH AND CORS/GRAFTS ANGIOGRAPHY N/A 04/22/2020   Procedure: LEFT HEART CATH AND CORS/GRAFTS ANGIOGRAPHY;  Surgeon: Claudene Victory LELON, MD;  Location: [facility] INVASIVE CV LAB;  Service: Cardiovascular;  Laterality: N/A;   TIBIA FRACTURE SURGERY Left [facility]; took bone out of my right hip   TONSILLECTOMY     UMBILICAL HERNIA REPAIR  ~ 2000    Allergies: Anoro ellipta  [umeclidinium-vilanterol], Lipitor [atorvastatin calcium ], Niaspan [niacin], Tricor [fenofibrate], and Zocor [simvastatin]  Medications: Prior to Admission medications  Medication Sig Start Date End Date Taking? Authorizing Provider  acetaminophen  (TYLENOL ) 500 MG tablet Take 1,000 mg by mouth 2 (two) times daily as needed for moderate pain (pain score 4-6), fever or headache.    [provider]  albuterol  (VENTOLIN  HFA) 108 (90 Base) MCG/ACT inhaler Inhale 2 puffs into the lungs  every 6 (six) hours as needed for wheezing or shortness of breath.    [provider]  ALPRAZolam  (XANAX ) 1 MG tablet Take 1 tablet (1 mg total) by mouth at bedtime as needed for anxiety. 11/01/16   Triplett, Tammy, PA-C  Ascorbic Acid  (VITAMIN C  PO) Take 1 tablet by mouth daily with breakfast.    [provider]  aspirin  EC 81 MG tablet Take 1 tablet (81 mg total) by mouth daily. 02/12/17   Debera Jayson MATSU, MD  B  Complex-C (SUPER B COMPLEX PO) Take 1 capsule by mouth daily with breakfast.    [provider]  CHELATED ZINC  PO Take 1 tablet by mouth daily with breakfast.    [provider]  empagliflozin  (JARDIANCE ) 10 MG TABS tablet Take 1 tablet (10 mg total) by mouth daily. 10/07/23   Hayes Beckey CROME, NP  Ferrous Sulfate (IRON PO) Take 1 tablet by mouth daily with breakfast.    [provider]  furosemide  (LASIX ) 20 MG tablet Take 4 tablets (80 mg total) by mouth daily. 11/12/23   Lee, Jordan, NP  ipratropium-albuterol  (DUONEB) 0.5-2.5 (3) MG/3ML SOLN Take 3 mLs by nebulization every 4 (four) hours as needed (wheezing, shortness of breath). 03/11/23   [provider]  isosorbide  mononitrate (IMDUR ) 30 MG 24 hr tablet Take 0.5 tablets (15 mg total) by mouth at bedtime. 10/07/23   Hayes Beckey CROME, NP  losartan  (COZAAR ) 25 MG tablet Take 0.5 tablets (12.5 mg total) by mouth daily. 11/12/23   Lee, Jordan, NP  mexiletine (MEXITIL) 200 MG capsule TAKE ONE CAPSULE BY MOUTH TWICE DAILY 02/04/24   Marcine Catalan M, PA-C  Multiple Vitamins-Minerals (MENS 50+ MULTIVITAMIN) TABS Take 1 tablet by mouth daily with breakfast.    [provider]  NEXLIZET 180-10 MG TABS Take 1 tablet by mouth daily. 07/23/22   [provider]  nitroGLYCERIN  (NITROSTAT ) 0.4 MG SL tablet DISSOLVE 1 TABLET UNDER THE TONGUE EVERY 5 MINUTES AS NEEDED FOR CHEST PAIN. DO NOT EXCEED 3 DOSES IN 15 MIN. CALL 911 IF NO RELIEF 04/19/23   Debera Jayson MATSU, MD  Omega-3 Fatty Acids (FISH OIL PO) Take 1 capsule by mouth daily with breakfast.    [provider]  pantoprazole  (PROTONIX ) 40 MG tablet TAKE 1 TABLET BY MOUTH EVERY DAY 02/09/19   Debera Jayson MATSU, MD  potassium chloride  SA (KLOR-CON  M) 20 MEQ tablet Take 20 mEq by mouth daily. Take with Lasix     [provider]  spironolactone  (ALDACTONE ) 25 MG tablet Take 1 tablet (25 mg total) by mouth daily. 10/14/23   Milford, Harlene HERO, FNP   Tiotropium Bromide -Olodaterol (STIOLTO RESPIMAT ) 2.5-2.5 MCG/ACT AERS Inhale 2 puffs into the lungs daily.    [provider]     Family History  Problem Relation Age of Onset   Heart disease Father    Heart attack Father    Hypertension Mother    Stroke Mother     Social History   Socioeconomic History   Marital status: Married    Spouse name: Not on file   Number of children: Not on file   Years of education: Not on file   Highest education level: Not on file  Occupational History   Occupation: Welder x 10 yrs   Tobacco Use   Smoking status: Former    Types: Cigars    Quit date: 03/02/1997    Years since quitting: 26.9    Passive exposure: Never   Smokeless tobacco: Never  Tobacco comments:    smoked cigars x 10 yrs, never smoked cigarettes  Vaping Use   Vaping status: Never Used  Substance and Sexual Activity   Alcohol use: No    Alcohol/week: 0.0 standard drinks of alcohol    Comment: drank some when I was young; quit in the 1990s   Drug use: No   Sexual activity: Not Currently  Other Topics Concern   Not on file  Social History Narrative   Not on file   Social Drivers of Health   Tobacco Use: Medium Risk (10/12/2023)   Patient History    Smoking Tobacco Use: Former    Smokeless Tobacco Use: Never    Passive Exposure: Never  Physicist, Medical Strain: Low Risk (09/14/2023)   Overall Financial Resource Strain (CARDIA)    Difficulty of Paying Living Expenses: Not very hard  Food Insecurity: No Food Insecurity (09/13/2023)   Epic    Worried About Programme Researcher, Broadcasting/film/video in the Last Year: Never true    Ran Out of Food in the Last Year: Never true  Transportation Needs: No Transportation Needs (09/13/2023)   Epic    Lack of Transportation (Medical): No    Lack of Transportation (Non-Medical): No  Physical Activity: Not on file  Stress: Not on file  Social Connections: Socially Integrated (09/13/2023)   Social Connection and Isolation Panel     Frequency of Communication with Friends and Family: More than three times a week    Frequency of Social Gatherings with Friends and Family: More than three times a week    Attends Religious Services: More than 4 times per year    Active Member of Clubs or Organizations: Yes    Attends Banker Meetings: More than 4 times per year    Marital Status: Married  Depression (EYV7-0): Not on file  Alcohol Screen: Low Risk (09/14/2023)   Alcohol Screen    Last Alcohol Screening Score (AUDIT): 0  Housing: Low Risk (09/13/2023)   Epic    Unable to Pay for Housing in the Last Year: No    Number of Times Moved in the Last Year: 1    Homeless in the Last Year: No  Utilities: Not At Risk (09/13/2023)   Epic    Threatened with loss of utilities: No  Health Literacy: Not on file    Review of Systems: A 12 point ROS discussed and pertinent positives are indicated in the HPI above.  All other systems are negative.  Review of Systems  Gastrointestinal:  Positive for blood in stool.  All other systems reviewed and are negative.   Vital Signs: BP (!) 100/55   Pulse 76   Temp 97.6 F (36.4 C) (Oral)   Resp 18   Ht 5' 10 (1.778 m)   Wt 204 lb 3.4 oz (92.6 kg)   SpO2 97%   BMI 29.30 kg/m   Physical Exam Constitutional:      General: He is not in acute distress.    Appearance: He is not ill-appearing.  HENT:     Mouth/Throat:     Mouth: Mucous membranes are moist.     Pharynx: Oropharynx is clear.  Cardiovascular:     Rate and Rhythm: Normal rate.  Pulmonary:     Effort: Pulmonary effort is normal.  Abdominal:     Tenderness: There is no abdominal tenderness.  Musculoskeletal:     Right lower leg: Edema present.     Left lower leg: Edema present.  Skin:  General: Skin is warm and dry.  Neurological:     Mental Status: He is alert and oriented to person, place, and time.     Imaging:   Labs:  CBC: Recent Labs    09/15/23 0357 09/16/23 0733 02/14/24 2348  02/14/24 2355 02/15/24 0611  WBC 11.5* 11.0* 20.4*  --  18.0*  HGB 9.5* 9.4* 8.3* 9.2* 9.4*  HCT 29.6* 28.8* 27.0* 27.0* 29.1*  PLT 273 282 308  --  264    COAGS: Recent Labs    02/15/24 0155  INR 1.3*    BMP: Recent Labs    10/12/23 1446 11/12/23 1429 02/14/24 2348 02/14/24 2355 02/15/24 0611  NA 140 138 138 140 140  K 5.0 3.1* 4.5 4.7 4.9  CL 101 100 105 105 105  CO2 29 26 24   --  22  GLUCOSE 120* 105* 121* 116* 126*  BUN 22 21 29* 32* 28*  CALCIUM  9.2 9.0 8.1*  --  8.4*  CREATININE 1.48* 1.29* 1.47* 1.60* 1.34*  GFRNONAA 47* 56* 48*  --  53*    LIVER FUNCTION TESTS: Recent Labs    02/14/24 2348  BILITOT 1.3*  AST 35  ALT 15  ALKPHOS 56  PROT 5.3*  ALBUMIN 2.8*    TUMOR MARKERS: No results for input(s): AFPTM, CEA, CA199, CHROMGRNA in the last 8760 hours.  Assessment and Plan:  Diverticular bleed: The patient was initially approved for mesenteric angiogram with possible embolization. 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 Per Dr. Eliazar assessment, the location of the GIB would need to be accessed via the SMA pathway.   Dr. Jenna spoke directly with Dr. Albertus and discussed this finding. No IR procedure planned and the order will be deleted.   Thank you for this interesting consult.  I greatly enjoyed meeting William Elliott and look forward to participating in their care.  A copy of this report was sent to the requesting provider on this date.  Electronically Signed: Warren Dais, AGACNP-BC 02/15/2024, 11:33 AM   I spent a total of 20 Minutes    in face to face in clinical consultation, greater than 50% of which was counseling/coordinating care for GIB

## 2024-02-15 NOTE — H&P (View-Only) (Signed)
 Consultation  Referring Provider: CCm Meade Primary Care Physician:  [name], MD Primary Gastroenterologist:  Dr.Cirigliano / hospital only  Reason for Consultation: Lower GI bleed with hypotension  HPI: William Elliott is a 83 y.o. male with history of coronary artery disease, status post prior CABG 1999, and drug-eluting stent 2017.  NSTEMI July 2025 when he presented with acute lower GI bleeding.  Also with congestive heart failure, history of hypertension, prior CVA, hyperlipidemia, and COPD. Patient presented to the ER last night after he had acute onset of GI bleeding last evening with 2 fairly large-volume dark red bloody stools at home associated with weakness and dizziness without syncope.  He was brought to the emergency room.  He had no complaints of chest pain or shortness of breath, did complain of dizziness.  He has not had any abdominal pain or cramping. CTA was negative for evidence of acute bleed, there is left colonic diverticulosis noted without diverticulitis, stomach and duodenal sweep unremarkable, did note mild patchy bilateral lower lobe opacities favoring pneumonia. Chest x-ray shows stable mild cardiomegaly no frank interstitial edema, mild patchy bilateral lower lobe opacities atelectasis versus pneumonia.  Labs on arrival showed WBC of 20.4/hemoglobin 8.3/hematocrit 27.0 down from last hemoglobin in July of 9.4 hematocrit of 28.8 Sodium 138/potassium 4.5/BUN 29/creatinine 1.47/albumin 2.8 LFTs within normal limits Troponin 41> 58  Patient had 2 units packed RBCs overnight, hemoglobin at 6 AM was 9.4/hematocrit 29.1/WBC of 18 He is currently receiving third unit of packed RBCs.  This a.m. around 8:00 he had 2 large-volume grossly bloody stools each measuring about 500 cc and did have drop in blood pressure.  He is being given a liter of fluids.  He is not requiring pressors. Patient is mentating well, no complaints of chest pain or shortness of breath,  denies any abdominal pain or cramping.  Patient did not undergo endoscopic evaluation during his last hospitalization in July in the setting of NSTEMI, noted negative CTA, and low yield with finding culprit diverticulum on colonoscopy. He also had a similar presentation in 2024 which was managed conservatively and he did not have endoscopic evaluation. At the time of last admission we had discussed outpatient EGD and colonoscopy but does not look like he has scheduled to be seen in the office.  He has not been on any blood thinners, does take baby aspirin  daily  Past Medical History:  Diagnosis Date   Acute lower GI bleeding 2008   Anxiety    Asthma    Carotid artery disease    Asymptomatic left carotid bruit   COPD (chronic obstructive pulmonary disease) (HCC)    Coronary artery disease    a. CABG - 1999 with LIMA-LAD, SVG-DIAG, SVG-OM, SVG-RPDA  b. Cath in setting of NSTEMI 03/18/2015: patent LIMA-LAD, occluded SVG-RCA, occluded SVG-D1, 99% stenosis of SVG-OM treated w/ DES   Depression    Dyslipidemia    Erectile dysfunction    Essential hypertension    History of blood transfusion 2008   History of stroke    Hyperlipidemia    Myocardial infarction [facility])     Past Surgical History:  Procedure Laterality Date   CARDIAC CATHETERIZATION     CARDIAC CATHETERIZATION N/A 03/18/2015   Procedure: Left Heart Cath and Cors/Grafts Angiography;  Surgeon: Alm LELON Clay, MD;  Location: [facility] INVASIVE CV LAB;  Service: Cardiovascular;  Laterality: N/A;   CARDIAC CATHETERIZATION N/A 03/18/2015   Procedure: Coronary Stent Intervention;  Surgeon: Alm LELON Clay, MD;  Location: [facility] INVASIVE CV LAB;  Service: Cardiovascular;  Laterality: N/A;   CATARACT EXTRACTION W/ INTRAOCULAR LENS  IMPLANT, BILATERAL Bilateral    CORONARY ANGIOPLASTY     CORONARY ARTERY BYPASS GRAFT  1999   CORONARY STENT INTERVENTION N/A 04/22/2020   Procedure: CORONARY STENT INTERVENTION;  Surgeon: Claudene Victory ORN, MD;  Location:  [facility] INVASIVE CV LAB;  Service: Cardiovascular;  Laterality: N/A;   FRACTURE SURGERY     HERNIA REPAIR     LEFT HEART CATH AND CORS/GRAFTS ANGIOGRAPHY N/A 04/22/2020   Procedure: LEFT HEART CATH AND CORS/GRAFTS ANGIOGRAPHY;  Surgeon: Claudene Victory ORN, MD;  Location: [facility] INVASIVE CV LAB;  Service: Cardiovascular;  Laterality: N/A;   TIBIA FRACTURE SURGERY Left [facility]; took bone out of my right hip   TONSILLECTOMY     UMBILICAL HERNIA REPAIR  ~ 2000    Prior to Admission medications  Medication Sig Start Date End Date Taking? Authorizing Provider  acetaminophen  (TYLENOL ) 500 MG tablet Take 1,000 mg by mouth 2 (two) times daily as needed for moderate pain (pain score 4-6), fever or headache.    [provider]  albuterol  (VENTOLIN  HFA) 108 (90 Base) MCG/ACT inhaler Inhale 2 puffs into the lungs every 6 (six) hours as needed for wheezing or shortness of breath.    [provider]  ALPRAZolam  (XANAX ) 1 MG tablet Take 1 tablet (1 mg total) by mouth at bedtime as needed for anxiety. 11/01/16   Triplett, Tammy, PA-C  Ascorbic Acid  (VITAMIN C  PO) Take 1 tablet by mouth daily with breakfast.    [provider]  aspirin  EC 81 MG tablet Take 1 tablet (81 mg total) by mouth daily. 02/12/17   Debera Jayson MATSU, MD  B Complex-C (SUPER B COMPLEX PO) Take 1 capsule by mouth daily with breakfast.    [provider]  CHELATED ZINC  PO Take 1 tablet by mouth daily with breakfast.    [provider]  empagliflozin  (JARDIANCE ) 10 MG TABS tablet Take 1 tablet (10 mg total) by mouth daily. 10/07/23   Hayes Beckey CROME, NP  Ferrous Sulfate (IRON PO) Take 1 tablet by mouth daily with breakfast.    [provider]  furosemide  (LASIX ) 20 MG tablet Take 4 tablets (80 mg total) by mouth daily. 11/12/23   Lee, Jordan, NP  ipratropium-albuterol  (DUONEB) 0.5-2.5 (3) MG/3ML SOLN Take 3 mLs by nebulization every 4 (four) hours as needed (wheezing, shortness of breath).  03/11/23   [provider]  isosorbide  mononitrate (IMDUR ) 30 MG 24 hr tablet Take 0.5 tablets (15 mg total) by mouth at bedtime. 10/07/23   Hayes Beckey CROME, NP  losartan  (COZAAR ) 25 MG tablet Take 0.5 tablets (12.5 mg total) by mouth daily. 11/12/23   Lee, Jordan, NP  mexiletine (MEXITIL) 200 MG capsule TAKE ONE CAPSULE BY MOUTH TWICE DAILY 02/04/24   Marcine Catalan M, PA-C  Multiple Vitamins-Minerals (MENS 50+ MULTIVITAMIN) TABS Take 1 tablet by mouth daily with breakfast.    [provider]  NEXLIZET 180-10 MG TABS Take 1 tablet by mouth daily. 07/23/22   [provider]  nitroGLYCERIN  (NITROSTAT ) 0.4 MG SL tablet DISSOLVE 1 TABLET UNDER THE TONGUE EVERY 5 MINUTES AS NEEDED FOR CHEST PAIN. DO NOT EXCEED 3 DOSES IN 15 MIN. CALL 911 IF NO RELIEF 04/19/23   Debera Jayson MATSU, MD  Omega-3 Fatty Acids (FISH OIL PO) Take 1 capsule by mouth daily with breakfast.    [provider]  pantoprazole  (  PROTONIX ) 40 MG tablet TAKE 1 TABLET BY MOUTH EVERY DAY 02/09/19   Debera Jayson MATSU, MD  potassium chloride  SA (KLOR-CON  M) 20 MEQ tablet Take 20 mEq by mouth daily. Take with Lasix     [provider]  spironolactone  (ALDACTONE ) 25 MG tablet Take 1 tablet (25 mg total) by mouth daily. 10/14/23   Milford, Harlene HERO, FNP  Tiotropium Bromide -Olodaterol (STIOLTO RESPIMAT ) 2.5-2.5 MCG/ACT AERS Inhale 2 puffs into the lungs daily.    [provider]    Current Facility-Administered Medications  Medication Dose Route Frequency Provider Last Rate Last Admin   0.9 %  sodium chloride  infusion (Manually program via Guardrails IV Fluids)   Intravenous Once Stanek, Lawrence S, MD       0.9 %  sodium chloride  infusion (Manually program via Guardrails IV Fluids)   Intravenous Once Hattar, Zola SAILOR, MD       0.9 %  sodium chloride  infusion (Manually program via Guardrails IV Fluids)   Intravenous Once Hattar, Zola SAILOR, MD       0.9 %  sodium chloride  infusion (Manually program  via Guardrails IV Fluids)   Intravenous Once Hattar, Zola SAILOR, MD       Chlorhexidine  Gluconate Cloth 2 % PADS 6 each  6 each Topical Daily Layman Harlene, DO       docusate sodium  (COLACE) capsule 100 mg  100 mg Oral BID PRN Layman Harlene, DO       ondansetron  (ZOFRAN ) injection 4 mg  4 mg Intravenous Q6H PRN Layman Harlene, DO       Oral care mouth rinse  15 mL Mouth Rinse PRN Layman Harlene, DO       pantoprazole  (PROTONIX ) injection 40 mg  40 mg Intravenous Q12H Layman Harlene, DO   40 mg at 02/15/24 0456   polyethylene glycol (MIRALAX  / GLYCOLAX ) packet 17 g  17 g Oral Daily PRN Layman Harlene, DO        Allergies as of 02/14/2024 - Review Complete 02/14/2024  Allergen Reaction Noted   Anoro ellipta  [umeclidinium-vilanterol] Other (See Comments) 03/17/2015   Lipitor [atorvastatin calcium ] Other (See Comments) 06/27/2010   Niaspan [niacin] Other (See Comments) 07/03/2010   Tricor [fenofibrate] Other (See Comments) 09/12/2023   Zocor [simvastatin] Other (See Comments) 07/03/2010    Family History  Problem Relation Age of Onset   Heart disease Father    Heart attack Father    Hypertension Mother    Stroke Mother     Social History   Socioeconomic History   Marital status: Married    Spouse name: Not on file   Number of children: Not on file   Years of education: Not on file   Highest education level: Not on file  Occupational History   Occupation: Welder x 10 yrs   Tobacco Use   Smoking status: Former    Types: Cigars    Quit date: 03/02/1997    Years since quitting: 26.9    Passive exposure: Never   Smokeless tobacco: Never   Tobacco comments:    smoked cigars x 10 yrs, never smoked cigarettes  Vaping Use   Vaping status: Never Used  Substance and Sexual Activity   Alcohol use: No    Alcohol/week: 0.0 standard drinks of alcohol    Comment: drank some when I was young; quit in the 1990s   Drug use: No   Sexual activity: Not Currently  Other  Topics Concern   Not on file  Social History Narrative  Not on file   Social Drivers of Health   Tobacco Use: Medium Risk (10/12/2023)   Patient History    Smoking Tobacco Use: Former    Smokeless Tobacco Use: Never    Passive Exposure: Never  Physicist, Medical Strain: Low Risk (09/14/2023)   Overall Financial Resource Strain (CARDIA)    Difficulty of Paying Living Expenses: Not very hard  Food Insecurity: No Food Insecurity (09/13/2023)   Epic    Worried About Programme Researcher, Broadcasting/film/video in the Last Year: Never true    Ran Out of Food in the Last Year: Never true  Transportation Needs: No Transportation Needs (09/13/2023)   Epic    Lack of Transportation (Medical): No    Lack of Transportation (Non-Medical): No  Physical Activity: Not on file  Stress: Not on file  Social Connections: Socially Integrated (09/13/2023)   Social Connection and Isolation Panel    Frequency of Communication with Friends and Family: More than three times a week    Frequency of Social Gatherings with Friends and Family: More than three times a week    Attends Religious Services: More than 4 times per year    Active Member of [employer] or Organizations: Yes    Attends Banker Meetings: More than 4 times per year    Marital Status: Married  Catering Manager Violence: Not At Risk (09/13/2023)   Epic    Fear of Current or Ex-Partner: No    Emotionally Abused: No    Physically Abused: No    Sexually Abused: No  Depression (PHQ2-9): Not on file  Alcohol Screen: Low Risk (09/14/2023)   Alcohol Screen    Last Alcohol Screening Score (AUDIT): 0  Housing: Low Risk (09/13/2023)   Epic    Unable to Pay for Housing in the Last Year: No    Number of Times Moved in the Last Year: 1    Homeless in the Last Year: No  Utilities: Not At Risk (09/13/2023)   Epic    Threatened with loss of utilities: No  Health Literacy: Not on file    Review of Systems: Pertinent positive and negative review of systems were  noted in the above HPI section.  All other review of systems was otherwise negative.   Physical Exam: Vital signs in last 24 hours: Temp:  [97.5 F (36.4 C)-98.7 F (37.1 C)] 98.7 F (37.1 C) (12/16 0745) Pulse Rate:  [53-74] 64 (12/16 0730) Resp:  [17-30] 18 (12/16 0730) BP: (88-139)/(50-69) 100/55 (12/16 0730) SpO2:  [97 %-100 %] 97 % (12/16 0730) Weight:  [68 kg-92.6 kg] 92.6 kg (12/16 0500) Last BM Date : 02/15/24 General:   Alert,  Well-developed, well-nourished,thin elderly WM  pleasant and cooperative in NAD, son at bedside- color good Head:  Normocephalic and atraumatic. Eyes:  Sclera clear, no icterus.   Conjunctiva pink. Ears:  Normal auditory acuity. Nose:  No deformity, discharge,  or lesions. Mouth:  No deformity or lesions.   Neck:  Supple; no masses or thyromegaly. Lungs:  Clear throughout to auscultation.   No wheezes, crackles, or rhonchi.  Heart:  Regular rate and rhythm; no murmurs, clicks, rubs,  or gallops. Abdomen:  Soft,nontender, BS hyperactive , no mass or HSM  Rectal:  not done - pt had witnessed one liter of red blood per rectum around 8 am   Msk:  Symmetrical without gross deformities. . Pulses:  Normal pulses noted. Extremities:  Without clubbing or edema. Neurologic:  Alert and  oriented x4;  grossly normal neurologically. Skin:  Intact without significant lesions or rashes.. Psych:  Alert and cooperative. Normal mood and affect.  Intake/Output from previous day: 12/15 0701 - 12/16 0700 In: 2554 [I.V.:820; Blood:634; IV Piggyback:1100] Out: -  Intake/Output this shift: Total I/O In: 310 [Blood:310] Out: 1000 [Stool:1000]  Lab Results: Recent Labs    02/14/24 2348 02/14/24 2355 02/15/24 0611  WBC 20.4*  --  18.0*  HGB 8.3* 9.2* 9.4*  HCT 27.0* 27.0* 29.1*  PLT 308  --  264   BMET Recent Labs    02/14/24 2348 02/14/24 2355 02/15/24 0611  NA 138 140 140  K 4.5 4.7 4.9  CL 105 105 105  CO2 24  --  22  GLUCOSE 121* 116* 126*  BUN  29* 32* 28*  CREATININE 1.47* 1.60* 1.34*  CALCIUM  8.1*  --  8.4*   LFT Recent Labs    02/14/24 2348  PROT 5.3*  ALBUMIN 2.8*  AST 35  ALT 15  ALKPHOS 56  BILITOT 1.3*   PT/INR Recent Labs    02/15/24 0155  LABPROT 17.3*  INR 1.3*   Hepatitis Panel No results for input(s): HEPBSAG, HCVAB, HEPAIGM, HEPBIGM in the last 72 hours.    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 CTA late last night negative for acute bleed, stomach and duodenum unremarkable, left-sided diverticulosis noted  He has had 3-4 further episodes of bleeding 2 of those large-volume around 8 AM with drop in blood pressure. 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  Congestive heart failure  Plan; discussed with critical care, sent for repeat stat CTA now if positive then directly onto IR for embolization.  This was also discussed with patient and his son at bedside and they are in agreement. If CTA is negative then we can plan for a bowel prep and attempt at colonoscopy versus continue to manage supportively depending on his course throughout the day today. Hemoglobins every 6 hours and keep hemoglobin 8 given his advanced age and coronary artery  disease Bedrest today GI will follow with you   Eh Sesay EsterwoodPA-C  02/15/2024, 9:13 AM

## 2024-02-16 ENCOUNTER — Inpatient Hospital Stay (HOSPITAL_COMMUNITY): Admitting: Certified Registered"

## 2024-02-16 ENCOUNTER — Encounter (HOSPITAL_COMMUNITY): Admission: EM | Disposition: A | Payer: Self-pay | Source: Home / Self Care | Attending: Family Medicine

## 2024-02-16 ENCOUNTER — Encounter (HOSPITAL_COMMUNITY): Payer: Self-pay | Admitting: Critical Care Medicine

## 2024-02-16 DIAGNOSIS — K5520 Angiodysplasia of colon without hemorrhage: Secondary | ICD-10-CM

## 2024-02-16 DIAGNOSIS — K5730 Diverticulosis of large intestine without perforation or abscess without bleeding: Secondary | ICD-10-CM | POA: Diagnosis not present

## 2024-02-16 DIAGNOSIS — Z87891 Personal history of nicotine dependence: Secondary | ICD-10-CM | POA: Diagnosis not present

## 2024-02-16 DIAGNOSIS — K222 Esophageal obstruction: Secondary | ICD-10-CM

## 2024-02-16 DIAGNOSIS — R933 Abnormal findings on diagnostic imaging of other parts of digestive tract: Secondary | ICD-10-CM

## 2024-02-16 DIAGNOSIS — I2510 Atherosclerotic heart disease of native coronary artery without angina pectoris: Secondary | ICD-10-CM | POA: Diagnosis not present

## 2024-02-16 DIAGNOSIS — K5521 Angiodysplasia of colon with hemorrhage: Secondary | ICD-10-CM

## 2024-02-16 DIAGNOSIS — I10 Essential (primary) hypertension: Secondary | ICD-10-CM | POA: Diagnosis not present

## 2024-02-16 DIAGNOSIS — D62 Acute posthemorrhagic anemia: Secondary | ICD-10-CM | POA: Diagnosis not present

## 2024-02-16 DIAGNOSIS — K921 Melena: Secondary | ICD-10-CM | POA: Diagnosis not present

## 2024-02-16 HISTORY — PX: ESOPHAGOGASTRODUODENOSCOPY: SHX5428

## 2024-02-16 HISTORY — PX: HOT HEMOSTASIS: SHX5433

## 2024-02-16 HISTORY — PX: COLONOSCOPY: SHX5424

## 2024-02-16 LAB — PREPARE FRESH FROZEN PLASMA

## 2024-02-16 LAB — PREPARE PLATELET PHERESIS
Unit division: 0
Unit division: 0

## 2024-02-16 LAB — CBC
HCT: 25.5 % — ABNORMAL LOW (ref 39.0–52.0)
HCT: 23.3 % — ABNORMAL LOW (ref 39.0–52.0)
Hemoglobin: 8.7 g/dL — ABNORMAL LOW (ref 13.0–17.0)
Hemoglobin: 8 g/dL — ABNORMAL LOW (ref 13.0–17.0)
MCH: 28.6 pg (ref 26.0–34.0)
MCH: 29.1 pg (ref 26.0–34.0)
MCHC: 34.1 g/dL (ref 30.0–36.0)
MCHC: 34.3 g/dL (ref 30.0–36.0)
MCV: 83.9 fL (ref 80.0–100.0)
MCV: 84.7 fL (ref 80.0–100.0)
Platelets: 238 K/uL (ref 150–400)
Platelets: 241 K/uL (ref 150–400)
RBC: 3.04 MIL/uL — ABNORMAL LOW (ref 4.22–5.81)
RBC: 2.75 MIL/uL — ABNORMAL LOW (ref 4.22–5.81)
RDW: 16.5 % — ABNORMAL HIGH (ref 11.5–15.5)
RDW: 17.1 % — ABNORMAL HIGH (ref 11.5–15.5)
WBC: 21.3 K/uL — ABNORMAL HIGH (ref 4.0–10.5)
WBC: 36.4 K/uL — ABNORMAL HIGH (ref 4.0–10.5)
nRBC: 0 % (ref 0.0–0.2)
nRBC: 0 % (ref 0.0–0.2)

## 2024-02-16 LAB — BASIC METABOLIC PANEL WITH GFR
Anion gap: 14 (ref 5–15)
BUN: 41 mg/dL — ABNORMAL HIGH (ref 8–23)
CO2: 20 mmol/L — ABNORMAL LOW (ref 22–32)
Calcium: 8.4 mg/dL — ABNORMAL LOW (ref 8.9–10.3)
Chloride: 103 mmol/L (ref 98–111)
Creatinine, Ser: 2.13 mg/dL — ABNORMAL HIGH (ref 0.61–1.24)
GFR, Estimated: 31 mL/min — ABNORMAL LOW (ref >60)
Glucose, Bld: 158 mg/dL — ABNORMAL HIGH (ref 70–99)
Potassium: 4.1 mmol/L (ref 3.5–5.1)
Sodium: 137 mmol/L (ref 135–145)

## 2024-02-16 LAB — BPAM FFP
Blood Product Expiration Date: 202512202359
Blood Product Expiration Date: 202512202359
Blood Product Expiration Date: 202512202359
ISSUE DATE / TIME: 202512161025
Unit Type and Rh: 6200
Unit Type and Rh: 6200
Unit Type and Rh: 6200

## 2024-02-16 LAB — BPAM PLATELET PHERESIS
Blood Product Expiration Date: 202512182359
Blood Product Expiration Date: 202601122359
ISSUE DATE / TIME: 202512161022
Unit Type and Rh: 5100
Unit Type and Rh: 5100

## 2024-02-16 LAB — PROTIME-INR
INR: 1.4 — ABNORMAL HIGH (ref 0.8–1.2)
Prothrombin Time: 17.6 s — ABNORMAL HIGH (ref 11.4–15.2)

## 2024-02-16 MED ORDER — SODIUM CHLORIDE 0.9 % IV SOLN: INTRAVENOUS | Status: DC | PRN

## 2024-02-16 MED ORDER — BISACODYL 5 MG PO TBEC
10.0000 mg | DELAYED_RELEASE_TABLET | Freq: Once | ORAL | Status: AC
  Administered 2024-02-16: 03:00:00 10 mg via ORAL
  Filled 2024-02-16: qty 2

## 2024-02-16 MED ORDER — MENTHOL 3 MG MT LOZG
1.0000 | LOZENGE | OROMUCOSAL | Status: DC | PRN
  Administered 2024-02-16 – 2024-02-17 (×2): 3 mg via ORAL
  Filled 2024-02-16: qty 9

## 2024-02-16 MED ORDER — LIDOCAINE 2% (20 MG/ML) 5 ML SYRINGE
INTRAMUSCULAR | Status: DC | PRN
  Administered 2024-02-16: 10:00:00 60 mg via INTRAVENOUS

## 2024-02-16 MED ORDER — BISACODYL 5 MG PO TBEC
10.0000 mg | DELAYED_RELEASE_TABLET | Freq: Once | ORAL | Status: AC
  Administered 2024-02-16: 06:00:00 10 mg via ORAL
  Filled 2024-02-16: qty 2

## 2024-02-16 MED ORDER — PHENYLEPHRINE HCL-NACL 20-0.9 MG/250ML-% IV SOLN
INTRAVENOUS | Status: DC | PRN
  Administered 2024-02-16: 10:00:00 40 ug/min via INTRAVENOUS

## 2024-02-16 MED ADMIN — PROPOFOL 200 MG/20ML IV EMUL: 85 ug/kg/min | INTRAVENOUS | @ 10:00:00 | NDC 00069020901

## 2024-02-16 MED ADMIN — PROPOFOL 200 MG/20ML IV EMUL: 20 mg | INTRAVENOUS | @ 10:00:00 | NDC 00069020901

## 2024-02-16 NOTE — Progress Notes (Signed)
 Reached out to Wilhelmenia Raddle., MD in regard to patient concern of Miralax  having sulfites in it and caused patient Anxiety and slight shortness of breath. Did update MD that breathing treatment was given and patient felt better, but patient does not want to have anymore Miralax . He did have all except a quarter of the Miralax  prep. Epimenio, MD with Critical Care d/c Miralax . Lynwood Mckusick, [facility] with pharmacy added to conversation. Confirmed that Miralax  used does not have sulfites in it. Wilhelmenia Raddle, MD wrote order for Dulcolax oral. Please see new orders.

## 2024-02-16 NOTE — Anesthesia Postprocedure Evaluation (Signed)
 Anesthesia Post Note  Patient: William Elliott  Procedure(s) Performed: COLONOSCOPY EGD (ESOPHAGOGASTRODUODENOSCOPY) COLONOSCOPY, WITH ARGON PLASMA COAGULATION     Patient location during evaluation: PACU Anesthesia Type: MAC Level of consciousness: awake and alert and oriented Pain management: pain level controlled Vital Signs Assessment: post-procedure vital signs reviewed and stable Respiratory status: spontaneous breathing, nonlabored ventilation and respiratory function stable Cardiovascular status: stable and blood pressure returned to baseline Postop Assessment: no apparent nausea or vomiting Anesthetic complications: no   No notable events documented.  Last Vitals:  Vitals:   02/16/24 1100 02/16/24 1110  BP: 119/65 118/89  Pulse: 95 97  Resp: 18 (!) 25  Temp:    SpO2: 97% 98%    Last Pain:  Vitals:   02/16/24 1110  TempSrc:   PainSc: 0-No pain                 Emilo Gras A.

## 2024-02-16 NOTE — Progress Notes (Signed)
 0030: Blood bank called in regard to additional 1 unit FFP and additional 1 unit Platelets that patient did not receive from 0800 on 02/15/24. Labs are stable. Epimenio, MD notified. Orders to not given additional units from morning orders at this time. Orders are in for serial CBCs.    0120: Patient drank majority of the second dose of Miralax  prep for Colonoscopy. He had maybe a quarter of the prep to go. He did wake up to sip a little more and felt anxious and short of breath. Patient saturations were stable and breath sounds were clear. RT did give prn breathing treatment. Patient felt better after treatment. He did state that he is allergic to sulfites and found that Miralax  has sulfites in it. Allergy added to chart and patient requested not to drink the rest of gatorade/miralax  prep.

## 2024-02-16 NOTE — Progress Notes (Signed)
 NAME:  William Elliott, MRN:  [id number], DOB:  Oct 16, 1940, LOS: 1 ADMISSION DATE:  02/14/2024, CONSULTATION DATE:  02/15/24 REFERRING MD:  EDP, CHIEF COMPLAINT:  GIB  History of Present Illness:  83 yo male presented after acute GIB starting around 1700 12/15. Pt had bowel movement that was unremarkable at that time but shortly after around 2145 pt was awoken with another large but this time bloody stool. Pt was brought in by EMS.    Son is at bedside and states that pt has had 2 bouts of ~2L clots and stool today. He has been weak since events and req assistance to stand. Son was called by his mother to come assist his father after being found in the shower attempting to clean himself after the first episode of bloody bm.    Pt denies pain, no n/v, no cp/sob. Mild dizziness since 2nd episode upon standing. States this is not the first time he has had large GIB, states he was previously told that he is diverticulosis and his bleeding is likely from that. Most recently admitted  in 08/2023 and 01/2023 with same. His chronic a/c has been long discontinued prior to 2023   Pertinent  Medical History  H/o GIB Cad s/p DES and CABG Chronic afib HTN Hyperlipidemia  H/o cva  Significant Hospital Events: Including procedures, antibiotic start and stop dates in addition to other pertinent events   Admit 12/16 for LGIB and hemorrhagic shock  Interim History / Subjective:  Awake and alert. Had several BM w/ last few more brown than red.  S/p 5x PRBC, 1x FFP and 1x PLT. Took bowel prep and colonoscopy today.   Objective    Blood pressure 114/72, pulse 98, temperature 98.3 F (36.8 C), temperature source Oral, resp. rate 20, height 5' 10 (1.778 m), weight 69.6 kg, SpO2 96%.        Intake/Output Summary (Last 24 hours) at 02/16/2024 0659 Last data filed at 02/16/2024 0335 Gross per 24 hour  Intake 2742.19 ml  Output 2375 ml  Net 367.19 ml   Filed Weights   02/14/24 2344 02/15/24 0500  02/16/24 0557  Weight: 68 kg 92.6 kg 69.6 kg    Examination: General: awake, pallor improved, in no acute distress Lungs: normal effort on room air, anterior lung sounds clear Cardiovascular: RRR, systolic murmur LSB Abdomen: bowel sounds active, soft, NT, ND Extremities: no LE edema Neuro: A&Ox3  Lab Scr 2.13 (1.34) Bicarb 20 (22) BUN 41 (28) WBC 21 (21) Hgb 7.2>>9.6>>9.0>>8.7 PT-INR 17.6/1.4  Resolved problem list    Assessment and Plan  Neuro No acute issues. Awake and alert, answering questions appropriately.   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 On Stiolto but reports allergy to Anoro. Will start Yupelri  and Brovana  nebs while here.   B/l Lung Infiltrates c/f PNA: Received CTX in ED. CXR w/ b/l opacities, atelectasis vs PNA. Will continue CTX.  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 Hold off GDMT due to low normotension (losartan , spiro, jardiance ) w/ acute GIB. Not on Eliquis or other AC for months given prior hx of GIB.  -Hold GDMT at this time -Hold ASA at this time  Hemorrhagic shock  Improving s/p 5x PRBC and 1x of FFP and PLT and LR bolus. Trending CBC.   GI LGIB w/ hematochezia Diverticulosis  Having BM but less red after bowel prep. 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 Hgb steady at 8.7after 5 units PRBC.  -maintain 2 x PIV -IV PPI BID  -Daily PT-INR -CBC Q6H -Appreciate GI and plan EGD and colonoscopy today   ID Leukocytosis: 21.3 (21.9). Afebrile. On CTX for b/l lung infiltrates. Did receive 5 PRBC along w/ FFP and PLT. -Trend CBC and fever curve  -F/u Bcx (so far NG) -Continue CTX as above  Endo  No hx of DM. CBG stable.   Renal AKI Scr 1.47 on admission. Last Scr 1.29 w/ adjustments of his GDMT. No hx of CKD. Worsened to 2.13 today. UOP 600 yesterday. 2x CT w contrast yesterday.  -Trend renal  function, if worsening or unchanged obtain renal U/s and urine studies  -Monitor urine output   Heme See above for LGIB.   Anticoagulation: No pharmacologic due to GIB, SCDs for DVT ppx   Other Lines: 2 x PIV GOC: full   Labs   CBC: Recent Labs  Lab 02/14/24 2348 02/14/24 2355 02/15/24 0611 02/15/24 1214 02/15/24 1933 02/15/24 2209 02/16/24 0234  WBC 20.4*  --  18.0* 16.3* 22.6* 21.9* 21.3*  NEUTROABS 18.6*  --   --   --   --   --   --   HGB 8.3*   < > 9.4* 7.2* 9.6* 9.0* 8.7*  HCT 27.0*   < > 29.1* 21.8* 27.9* 26.2* 25.5*  MCV 88.2  --  84.8 86.5 86.4 85.3 83.9  PLT 308  --  264 224 234 237 238   < > = values in this interval not displayed.    Basic Metabolic Panel: Recent Labs  Lab 02/14/24 2348 02/14/24 2355 02/15/24 0611 02/16/24 0234  NA 138 140 140 137  K 4.5 4.7 4.9 4.1  CL 105 105 105 103  CO2 24  --  22 20*  GLUCOSE 121* 116* 126* 158*  BUN 29* 32* 28* 41*  CREATININE 1.47* 1.60* 1.34* 2.13*  CALCIUM  8.1*  --  8.4* 8.4*  MG  --   --  2.1  --    GFR: Estimated Creatinine Clearance: 26.8 mL/min (A) (by C-G formula based on SCr of 2.13 mg/dL (H)). Recent Labs  Lab 02/15/24 0210 02/15/24 0611 02/15/24 1214 02/15/24 1933 02/15/24 2209 02/16/24 0234  WBC  --    < > 16.3* 22.6* 21.9* 21.3*  LATICACIDVEN 1.7  --   --   --   --   --    < > = values in this interval not displayed.    Liver Function Tests: Recent Labs  Lab 02/14/24 2348  AST 35  ALT 15  ALKPHOS 56  BILITOT 1.3*  PROT 5.3*  ALBUMIN 2.8*   No results for input(s): LIPASE, AMYLASE in the last 168 hours. No results for input(s): AMMONIA in the last 168 hours.  ABG    Component Value Date/Time   TCO2 24 02/14/2024 2355     Coagulation Profile: Recent Labs  Lab 02/15/24 0155 02/16/24 0234  INR 1.3* 1.4*    Cardiac Enzymes: No results for input(s): CKTOTAL, CKMB, CKMBINDEX, TROPONINI in the last 168 hours.  HbA1C: Hgb A1c MFr Bld  Date/Time Value  Ref Range Status  12/09/2015 06:16 AM 5.5 4.8 - 5.6 % Final    Comment:    (NOTE)         Pre-diabetes: 5.7 - 6.4         Diabetes: >6.4         Glycemic  control for adults with diabetes: <7.0     CBG: Recent Labs  Lab 02/15/24 0422  GLUCAP 114*    Review of Systems:   As above   Past Medical History:  He,  has a past medical history of Acute lower GI bleeding (2008), Anxiety, Asthma, Carotid artery disease, COPD (chronic obstructive pulmonary disease) (HCC), Coronary artery disease, Depression, Dyslipidemia, Erectile dysfunction, Essential hypertension, History of blood transfusion (2008), History of stroke, Hyperlipidemia, and Myocardial infarction (HCC).   Surgical History:   Past Surgical History:  Procedure Laterality Date   CARDIAC CATHETERIZATION     CARDIAC CATHETERIZATION N/A 03/18/2015   Procedure: Left Heart Cath and Cors/Grafts Angiography;  Surgeon: Alm LELON Clay, MD;  [facility]AS[facility] CV LAB;  Service: Cardiovascular;  Laterality: N/A;   CARDIAC CATHETERIZATION N/A 03/18/2015   Procedure: Coronary Stent Intervention;  Surgeon: Alm LELON Clay, MD;  Location: [facility] INVASIVE CV LAB;  Service: Cardiovascular;  Laterality: N/A;   CATARACT EXTRACTION W/ INTRAOCULAR LENS  IMPLANT, BILATERAL Bilateral    CORONARY ANGIOPLASTY     CORONARY ARTERY BYPASS GRAFT  1999   CORONARY STENT INTERVENTION N/A 04/22/2020   Procedure: CORONARY STENT INTERVENTION;  Surgeon: Claudene Victory LELON, MD;  Location: [facility] INVASIVE CV LAB;  Service: Cardiovascular;  Laterality: N/A;   FRACTURE SURGERY     HERNIA REPAIR     LEFT HEART CATH AND CORS/GRAFTS ANGIOGRAPHY N/A 04/22/2020   Procedure: LEFT HEART CATH AND CORS/GRAFTS ANGIOGRAPHY;  Surgeon: Claudene Victory LELON, MD;  Location: [facility] INVASIVE CV LAB;  Service: Cardiovascular;  Laterality: N/A;   TIBIA FRACTURE SURGERY Left [facility]; took bone out of my right hip   TONSILLECTOMY     UMBILICAL HERNIA REPAIR  ~ 2000     Social  History:   reports that he quit smoking about 26 years ago. His smoking use included cigars. He has never been exposed to tobacco smoke. He has never used smokeless tobacco. He reports that he does not drink alcohol and does not use drugs.   Family History:  His family history includes Heart attack in his father; Heart disease in his father; Hypertension in his mother; Stroke in his mother.   Allergies Allergies[1]   Home Medications  Prior to Admission medications  Medication Sig Start Date End Date Taking? Authorizing Provider  acetaminophen  (TYLENOL ) 500 MG tablet Take 1,000 mg by mouth 2 (two) times daily as needed for moderate pain (pain score 4-6), fever or headache.    [provider]  albuterol  (VENTOLIN  HFA) 108 (90 Base) MCG/ACT inhaler Inhale 2 puffs into the lungs every 6 (six) hours as needed for wheezing or shortness of breath.    [provider]  ALPRAZolam  (XANAX ) 1 MG tablet Take 1 tablet (1 mg total) by mouth at bedtime as needed for anxiety. 11/01/16   Triplett, Tammy, PA-C  Ascorbic Acid  (VITAMIN C  PO) Take 1 tablet by mouth daily with breakfast.    [provider]  aspirin  EC 81 MG tablet Take 1 tablet (81 mg total) by mouth daily. 02/12/17   Debera Jayson MATSU, MD  B Complex-C (SUPER B COMPLEX PO) Take 1 capsule by mouth daily with breakfast.    [provider]  CHELATED ZINC  PO Take 1 tablet by mouth daily with breakfast.    [provider]  empagliflozin  (JARDIANCE ) 10 MG TABS tablet Take 1 tablet (10 mg total) by mouth daily. 10/07/23   Hayes Beckey CROME, NP  Ferrous Sulfate (  IRON PO) Take 1 tablet by mouth daily with breakfast.    [provider]  furosemide  (LASIX ) 20 MG tablet Take 4 tablets (80 mg total) by mouth daily. 11/12/23   Lee, Jordan, NP  ipratropium-albuterol  (DUONEB) 0.5-2.5 (3) MG/3ML SOLN Take 3 mLs by nebulization every 4 (four) hours as needed (wheezing, shortness of breath). 03/11/23   [provider]  isosorbide  mononitrate (IMDUR ) 30 MG 24 hr tablet Take 0.5 tablets (15 mg total) by mouth at bedtime. 10/07/23   Hayes Beckey CROME, NP  losartan  (COZAAR ) 25 MG tablet Take 0.5 tablets (12.5 mg total) by mouth daily. 11/12/23   Lee, Jordan, NP  mexiletine (MEXITIL) 200 MG capsule TAKE ONE CAPSULE BY MOUTH TWICE DAILY 02/04/24   Marcine Catalan M, PA-C  Multiple Vitamins-Minerals (MENS 50+ MULTIVITAMIN) TABS Take 1 tablet by mouth daily with breakfast.    [provider]  NEXLIZET 180-10 MG TABS Take 1 tablet by mouth daily. 07/23/22   [provider]  nitroGLYCERIN  (NITROSTAT ) 0.4 MG SL tablet DISSOLVE 1 TABLET UNDER THE TONGUE EVERY 5 MINUTES AS NEEDED FOR CHEST PAIN. DO NOT EXCEED 3 DOSES IN 15 MIN. CALL 911 IF NO RELIEF 04/19/23   Debera Jayson MATSU, MD  Omega-3 Fatty Acids (FISH OIL PO) Take 1 capsule by mouth daily with breakfast.    [provider]  pantoprazole  (PROTONIX ) 40 MG tablet TAKE 1 TABLET BY MOUTH EVERY DAY 02/09/19   Debera Jayson MATSU, MD  potassium chloride  SA (KLOR-CON  M) 20 MEQ tablet Take 20 mEq by mouth daily. Take with Lasix     [provider]  spironolactone  (ALDACTONE ) 25 MG tablet Take 1 tablet (25 mg total) by mouth daily. 10/14/23   Milford, Harlene HERO, FNP  Tiotropium Bromide -Olodaterol (STIOLTO RESPIMAT ) 2.5-2.5 MCG/ACT AERS Inhale 2 puffs into the lungs daily.    [provider]     Critical care time:      Signature: Ozell Nearing, DO Internal Medicine Resident PGY-3        [1]  Allergies Allergen Reactions   Anoro Ellipta  [Umeclidinium-Vilanterol] Other (See Comments)    Cardiac problems   Sulfites Shortness Of Breath and Anxiety   Lipitor [Atorvastatin Calcium ] Other (See Comments)    Unknown reaction   Niaspan [Niacin] Other (See Comments)    Unknown reaction   Tricor [Fenofibrate] Other (See Comments)    Fatigue    Zocor [Simvastatin] Other (See Comments)    Unknown reaction

## 2024-02-16 NOTE — Progress Notes (Signed)
 Received call from ICU nurse. While taking the second half of the preparation, patient had shortness of breath requiring a breathing treatment. Oxygen saturation remain normal. There is concern for possible sulfites in MiraLAX  preparation. It is not clear if that is the case or not. Sulfites added to allergy list. Again, not clear that sulfites themselves are in MiraLAX  or Gatorade and would not be in suprpe either from my review but pharmacy to look into things as well. Give 10 mg oral Dulcolax at 2:30 AM. Give 10 mg or Delco lax at 5:30 AM. One tapwater enema at 8 AM. Drink 20 to 40 ounces of fluid (water or Gatorade) over the course of the next few hours to stop by 700AM. Will forward information to Day team, and they can reassess patient in a.m. about whether further preparation will be required or postponement of procedure scheduled for 10 AM. This information has been relate to ICU RN. Orders have been placed. GM.

## 2024-02-16 NOTE — Op Note (Signed)
 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 Starch, MD, Ozell Pouch, Felice Sar,                            Technician Referring MD:             Critical Care Medicine Medicines:                Monitored Anesthesia Care Complications:            No immediate complications. Estimated Blood Loss:     Estimated blood loss: none. Procedure:                Pre-Anesthesia Assessment:                           - Prior to the procedure, a History and Physical                            was performed, and patient medications and                            allergies were reviewed. The patient's tolerance of                            previous anesthesia was also reviewed. The risks                            and benefits of the procedure and the sedation                            options and risks were discussed with the patient.                            All questions were answered, and informed consent                            was obtained. Prior Anticoagulants: The patient has  taken no anticoagulant or antiplatelet agents. ASA                            Grade Assessment: III - A patient with severe                            systemic disease. After reviewing the risks and                            benefits, the patient was deemed in satisfactory                            condition to undergo the procedure.                            After obtaining informed consent, the colonoscope                            was passed under direct vision. Throughout the                            procedure, the patient's blood pressure, pulse, and                            oxygen saturations were monitored continuously. The                            [id number]-HQ190L ([id number]) Olympus colonoscope was                            introduced through the anus and advanced to the                            cecum, identified by appendiceal orifice and                            ileocecal valve. The colonoscopy was performed                            without difficulty. The patient tolerated the                            procedure well. The quality of the bowel                            preparation was good. The ileocecal valve,                            appendiceal orifice, and rectum were photographed. Scope In: 10:24:10 AM Scope Out: 10:46:34 AM Scope Withdrawal Time: 0 hours 17 minutes 51 seconds  Total Procedure Duration: 0 hours 22 minutes 24 seconds  Findings:      The digital rectal exam was normal.      Two angioectasias with typical arborization were found at the hepatic  flexure and in the ascending colon. Fulguration to prevent and stop the       bleeding by argon plasma at 0.5 liters/minute and 20 watts was       successful.      Multiple medium-mouthed and small-mouthed diverticula were found in the       sigmoid colon, descending colon, hepatic flexure, ascending colon and       cecum. None of the diverticula had active bleeding or stigmata of recent       bleeding. Irrigation was used but no diverticular bleeding source was       found.      The retroflexed view of the distal rectum and anal verge was normal and       showed no anal or rectal abnormalities. 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 Treated with argon plasma                             coagulation (APC).                           - Severe diverticulosis in the sigmoid colon, in                            the descending colon, at the hepatic flexure, in                            the ascending colon and in the cecum.                           - Lower GI bleeding stopped at present.                           - No specimens collected. Moderate Sedation:      N/A Recommendation:           - Return patient to hospital ward for ongoing care.                           - Advance diet as tolerated.                           - Continue present medications.                           - Any new rectal bleeding/hematochezia is                            re-bleeding.                           - Monitor Hgb and support with transfusion as                            needed. Procedure  Code(s):        --- Professional ---                           [phone], Colonoscopy, flexible; with control of                            bleeding, any method Diagnosis Code(s):        --- Professional ---                           K55.20, Angiodysplasia of colon without hemorrhage                           K92.1, Melena (includes Hematochezia)                           K57.30, Diverticulosis of large intestine without                            perforation or abscess without bleeding                           R93.3, Abnormal findings on diagnostic imaging of                            other parts of digestive tract CPT copyright 2022 [employer] Association. All rights reserved. The codes documented in this report are preliminary and upon coder review may  be revised to meet current compliance requirements. Gordy CHRISTELLA Starch, MD 02/16/2024 11:01:24 AM This report has been signed electronically. Number of Addenda: 0

## 2024-02-16 NOTE — Interval H&P Note (Signed)
 History and Physical Interval Note: Patient with presumed colonic diverticular bleed at the hepatic flexure as elucidated by CTA.  IR not able to access this arterial territory due to SMA occlusion.  Thus decision to proceed with colonoscopy today.  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 HIGHER THAN BASELINE RISK.The nature of the procedure, as well as the risks, benefits, and alternatives were carefully and thoroughly reviewed with the patient. Ample time for discussion and questions allowed. The patient understood, was satisfied, and agreed to proceed.      Latest Ref Rng & Units 02/16/2024    2:34 AM 02/15/2024   10:09 PM 02/15/2024    7:33 PM  CBC  WBC 4.0 - 10.5 K/uL 21.3  21.9  22.6   Hemoglobin 13.0 - 17.0 g/dL 8.7  9.0  9.6   Hematocrit 39.0 - 52.0 % 25.5  26.2  27.9   Platelets 150 - 400 K/uL 238  237  234      02/16/2024 9:39 AM  [name]  has presented today for surgery, with the diagnosis of acute GI bleed, diverticular , varices on CTA.  The various methods of treatment have been discussed with the patient and family. After consideration of risks, benefits and other options for treatment, the patient has consented to  Procedures: COLONOSCOPY (N/A) EGD (ESOPHAGOGASTRODUODENOSCOPY) (N/A) as a surgical intervention.  The patient's history has been reviewed, patient examined, no change in status, stable for surgery.  I have reviewed the patient's chart and labs.  Questions were answered to the patient's satisfaction.     Gordy HERO Boni Maclellan

## 2024-02-16 NOTE — Progress Notes (Signed)
 eLink Physician-Brief Progress Note Patient Name: William Elliott DOB: [birth year] MRN: [id number]   Date of Service  02/16/2024  HPI/Events of Note  Patient became short of breath after ingesting Miralax , concern is for possible allergy to the medication (he has an allergy to sulfites). Symptoms have resolved.  eICU Interventions  Miralax  discontinued. Bedside RN instructed to call GI to find out if they would like to order an alternative bowel prep.        Leviticus Harton U Keyleen Cerrato 02/16/2024, 1:41 AM

## 2024-02-16 NOTE — Transfer of Care (Signed)
 Immediate Anesthesia Transfer of Care Note  Patient: William Elliott  Procedure(s) Performed: COLONOSCOPY EGD (ESOPHAGOGASTRODUODENOSCOPY) COLONOSCOPY, WITH ARGON PLASMA COAGULATION  Patient Location: PACU  Anesthesia Type:MAC  Level of Consciousness: awake and drowsy  Airway & Oxygen Therapy: Patient Spontanous Breathing and Patient connected to face mask oxygen  Post-op Assessment: Report given to RN and Post -op Vital signs reviewed and stable  Post vital signs: Reviewed and stable  Last Vitals:  Vitals Value Taken Time  BP 151/119 02/16/24 10:57  Temp    Pulse 95 02/16/24 10:58  Resp 18 02/16/24 10:59  SpO2 93 % 02/16/24 10:58  Vitals shown include unfiled device data.  Last Pain:  Vitals:   02/16/24 0907  TempSrc: Temporal  PainSc: 0-No pain         Complications: No notable events documented.

## 2024-02-16 NOTE — Anesthesia Preprocedure Evaluation (Addendum)
 Anesthesia Evaluation  Patient identified by MRN, date of birth, ID band Patient awake    Reviewed: Allergy & Precautions, NPO status , [name] Chart, lab work & pertinent test results  Airway Mallampati: II  TM Distance: >3 FB     Dental no notable dental hx. (+) Caps, Dental Advisory Given   Pulmonary shortness of breath, with exertion and at rest, asthma , COPD,  COPD inhaler, former smoker   Pulmonary exam normal breath sounds clear to auscultation       Cardiovascular hypertension, Pt. on medications and Pt. on home beta blockers + angina with exertion + CAD, + Past MI, + Cardiac Stents, + CABG, + Peripheral Vascular Disease, +CHF and + Orthopnea   Rhythm:Irregular Rate:Normal  CABG - 1999 with LIMA-LAD, SVG-DIAG, SVG-OM, SVG-RPDA b. Cath in setting of NSTEMI 03/18/2015: patent LIMA-LAD, occluded SVG-RCA, occluded SVG-D1, 99% stenosis of SVG-OM treated w/ DES  EKG 02/15/24 Atrial fibrillation, RAD  Echo 09/13/23 1. Left ventricular ejection fraction, by estimation, is 35 to 40%. The  left ventricle has moderately decreased function. The left ventricle  demonstrates global hypokinesis. Left ventricular diastolic function could  not be evaluated. There is the  interventricular septum is flattened in systole and diastole, consistent  with right ventricular pressure and volume overload.   2. Right ventricular systolic function is mildly reduced. The right  ventricular size is moderately enlarged. There is moderately elevated  pulmonary artery systolic pressure. The estimated right ventricular  systolic pressure is 50.5 mmHg.   3. Left atrial size was moderately dilated.   4. Right atrial size was mildly dilated.   5. The mitral valve is degenerative. Mild to moderate mitral valve  regurgitation.   6. The aortic valve is tricuspid. Aortic valve regurgitation is mild.  Aortic valve sclerosis is present, with no evidence of  aortic valve  stenosis.   7. The inferior vena cava is dilated in size with <50% respiratory  variability, suggesting right atrial pressure of 15 mmHg.      Neuro/Psych  PSYCHIATRIC DISORDERS Anxiety Depression    CVA, No Residual Symptoms    GI/Hepatic ,GERD  Medicated,,(+) Cirrhosis     substance abuse  alcohol useHx/o ETOH abuse quit 10 years ago Rectal bleeding ? diverticuitis   Endo/Other  HLD  Renal/GU Renal InsufficiencyRenal diseaseLab Results      Component                Value               Date                      NA                       137                 02/16/2024                CL                       103                 02/16/2024                K                        4.1  02/16/2024                CO2                      20 (L)              02/16/2024                BUN                      41 (H)              02/16/2024                CREATININE               2.13 (H)            02/16/2024                GFRNONAA                 31 (L)              02/16/2024                CALCIUM                   8.4 (L)             02/16/2024                PHOS                     2.9                 01/21/2023                ALBUMIN                  2.8 (L)             02/14/2024                GLUCOSE                  158 (H)             02/16/2024             negative genitourinary   Musculoskeletal negative musculoskeletal ROS (+)    Abdominal   Peds  (+) Delivery details - Hematology  (+) Blood dyscrasia, anemia   Anesthesia Other Findings   Reproductive/Obstetrics                              Anesthesia Physical Anesthesia Plan  ASA: 3  Anesthesia Plan: MAC   Post-op Pain Management: Minimal or no pain anticipated   Induction: Intravenous  PONV Risk Score and Plan: 2 and Treatment may vary due to age or medical condition and Propofol  infusion  Airway Management Planned: Natural Airway and Simple  Face Mask  Additional Equipment: None  Intra-op Plan:   Post-operative Plan:   Informed Consent: I have reviewed the patients History and Physical, chart, labs and discussed the procedure including the risks, benefits and alternatives for the proposed anesthesia with the patient or authorized representative who has indicated his/her understanding and acceptance.     Dental advisory given  Plan Discussed with: CRNA and Anesthesiologist  Anesthesia Plan Comments:  Anesthesia Quick Evaluation

## 2024-02-16 NOTE — Procedures (Addendum)
 Spoke to patient's son by phone after procedure.  Also spoke directly to the patient. I explained the findings of the upper and lower endoscopy as well as treatment plan Time provided for questions and answers and I was thanked for the call

## 2024-02-16 NOTE — Plan of Care (Signed)

## 2024-02-16 NOTE — Op Note (Signed)
 [facility] Patient Name: William Elliott Procedure Date : 02/16/2024 MRN: [id number] Attending MD: Gordy CHRISTELLA Starch , MD, [id number] Date of Birth: [birth year] CSN: [id number] Age: 83 Admit Type: Inpatient Procedure:                Upper GI endoscopy Indications:              Recent gastrointestinal bleeding, Abnormal CTA of                            the GI tract suggesting gastric varices without                            other signs of cirrhosis Providers:                Gordy CHRISTELLA. Starch, MD, Ozell Pouch, Felice Sar,                            Technician Referring MD:             Critical Care Medicine Medicines:                Monitored Anesthesia Care Complications:            No immediate complications. Estimated Blood Loss:     Estimated blood loss: none. Procedure:                Pre-Anesthesia Assessment:                           - Prior to the procedure, a History and Physical                            was performed, and patient medications and                            allergies were reviewed. The patient's tolerance of                            previous anesthesia was also reviewed. The risks                            and benefits of the procedure and the sedation                            options and risks were discussed with the patient.                            All questions were answered, and informed consent                            was obtained. Prior Anticoagulants: The patient has                            taken no anticoagulant or antiplatelet agents. ASA  Grade Assessment: III - A patient with severe                            systemic disease. After reviewing the risks and                            benefits, the patient was deemed in satisfactory                            condition to undergo the procedure.                           After obtaining informed consent, the endoscope was                             passed under direct vision. Throughout the                            procedure, the patient's blood pressure, pulse, and                            oxygen saturations were monitored continuously. The                            GIF-H190 ([id number]) Olympus endoscope was introduced                            through the mouth, and advanced to the second part                            of duodenum. The upper GI endoscopy was                            accomplished without difficulty. The patient                            tolerated the procedure well. Scope In: Scope Out: Findings:      A non-obstructing, partial, Schatzki ring was found at the       gastroesophageal junction.      The exam of the esophagus was otherwise normal.      The entire examined stomach was normal.      The examined duodenum was normal. Impression:               - Non-obstructing, partial, Schatzki ring in distal                            esophagus.                           - Normal stomach.                           - Normal examined duodenum.                           -  No esophageal or gastric varices seen                            endoscopically.                           - No evidence of UGI bleeding.                           - No specimens collected. Moderate Sedation:      N/A Recommendation:           - Return patient to hospital ward for ongoing care.                           - Advance diet as tolerated.                           - Continue present medications.                           - See the colonoscopy procedure note for                            documentation of additional recommendations. Procedure Code(s):        --- Professional ---                           [phone], Esophagogastroduodenoscopy, flexible,                            transoral; diagnostic, including collection of                            specimen(s) by brushing or washing, when performed                             (separate procedure) Diagnosis Code(s):        --- Professional ---                           K22.2, Esophageal obstruction                           K92.2, Gastrointestinal hemorrhage, unspecified                           R93.3, Abnormal findings on diagnostic imaging of                            other parts of digestive tract CPT copyright 2022 American [employer]. All rights reserved. The codes documented in this report are preliminary and upon coder review may  be revised to meet current compliance requirements. Gordy CHRISTELLA Starch, MD 02/16/2024 10:21:32 AM This report has been signed electronically. Number of Addenda: 0

## 2024-02-17 ENCOUNTER — Inpatient Hospital Stay (HOSPITAL_COMMUNITY)

## 2024-02-17 DIAGNOSIS — I5023 Acute on chronic systolic (congestive) heart failure: Secondary | ICD-10-CM

## 2024-02-17 DIAGNOSIS — K5521 Angiodysplasia of colon with hemorrhage: Secondary | ICD-10-CM | POA: Diagnosis not present

## 2024-02-17 DIAGNOSIS — R58 Hemorrhage, not elsewhere classified: Secondary | ICD-10-CM

## 2024-02-17 DIAGNOSIS — I25810 Atherosclerosis of coronary artery bypass graft(s) without angina pectoris: Secondary | ICD-10-CM | POA: Diagnosis not present

## 2024-02-17 DIAGNOSIS — K5520 Angiodysplasia of colon without hemorrhage: Secondary | ICD-10-CM | POA: Diagnosis not present

## 2024-02-17 DIAGNOSIS — J189 Pneumonia, unspecified organism: Secondary | ICD-10-CM | POA: Diagnosis not present

## 2024-02-17 DIAGNOSIS — K5730 Diverticulosis of large intestine without perforation or abscess without bleeding: Secondary | ICD-10-CM | POA: Diagnosis not present

## 2024-02-17 DIAGNOSIS — D62 Acute posthemorrhagic anemia: Secondary | ICD-10-CM | POA: Diagnosis not present

## 2024-02-17 LAB — DIFFERENTIAL
Abs Immature Granulocytes: 0.16 K/uL — ABNORMAL HIGH (ref 0.00–0.07)
Basophils Absolute: 0 K/uL (ref 0.0–0.1)
Basophils Relative: 0 %
Eosinophils Absolute: 0 K/uL (ref 0.0–0.5)
Eosinophils Relative: 0 %
Immature Granulocytes: 1 %
Lymphocytes Relative: 4 %
Lymphs Abs: 0.8 K/uL (ref 0.7–4.0)
Monocytes Absolute: 1.1 K/uL — ABNORMAL HIGH (ref 0.1–1.0)
Monocytes Relative: 6 %
Neutro Abs: 18.7 K/uL — ABNORMAL HIGH (ref 1.7–7.7)
Neutrophils Relative %: 89 %

## 2024-02-17 LAB — BASIC METABOLIC PANEL WITH GFR
Anion gap: 11 (ref 5–15)
BUN: 47 mg/dL — ABNORMAL HIGH (ref 8–23)
CO2: 22 mmol/L (ref 22–32)
Calcium: 8 mg/dL — ABNORMAL LOW (ref 8.9–10.3)
Chloride: 101 mmol/L (ref 98–111)
Creatinine, Ser: 2.13 mg/dL — ABNORMAL HIGH (ref 0.61–1.24)
GFR, Estimated: 30 mL/min — ABNORMAL LOW (ref >60)
Glucose, Bld: 139 mg/dL — ABNORMAL HIGH (ref 70–99)
Potassium: 3.6 mmol/L (ref 3.5–5.1)
Sodium: 133 mmol/L — ABNORMAL LOW (ref 135–145)

## 2024-02-17 LAB — CBC
HCT: 21.1 % — ABNORMAL LOW (ref 39.0–52.0)
Hemoglobin: 7.5 g/dL — ABNORMAL LOW (ref 13.0–17.0)
MCH: 30.4 pg (ref 26.0–34.0)
MCHC: 35.5 g/dL (ref 30.0–36.0)
MCV: 85.4 fL (ref 80.0–100.0)
Platelets: 155 K/uL (ref 150–400)
RBC: 2.47 MIL/uL — ABNORMAL LOW (ref 4.22–5.81)
RDW: 17 % — ABNORMAL HIGH (ref 11.5–15.5)
WBC: 20.8 K/uL — ABNORMAL HIGH (ref 4.0–10.5)
nRBC: 0 % (ref 0.0–0.2)

## 2024-02-17 LAB — PREPARE RBC (CROSSMATCH)

## 2024-02-17 MED ORDER — SODIUM CHLORIDE 0.9% IV SOLUTION: Freq: Once | INTRAVENOUS | Status: AC

## 2024-02-17 MED ORDER — ALPRAZOLAM 0.5 MG PO TABS
0.5000 mg | ORAL_TABLET | Freq: Four times a day (QID) | ORAL | Status: DC | PRN
  Administered 2024-02-17 – 2024-02-19 (×5): 0.5 mg via ORAL
  Filled 2024-02-17 (×5): qty 1

## 2024-02-17 MED ORDER — FUROSEMIDE 10 MG/ML IJ SOLN
20.0000 mg | Freq: Once | INTRAMUSCULAR | Status: AC
  Administered 2024-02-18: 20 mg via INTRAVENOUS
  Filled 2024-02-17: qty 2

## 2024-02-17 MED ORDER — FUROSEMIDE 10 MG/ML IJ SOLN
40.0000 mg | Freq: Once | INTRAMUSCULAR | Status: AC
  Administered 2024-02-17: 13:00:00 40 mg via INTRAVENOUS
  Filled 2024-02-17: qty 4

## 2024-02-17 NOTE — Progress Notes (Signed)
 [facility] night cross cover note:   I was notified by the patient's RN that the patient as well as the patient's son present at bedside are requesting that the patient's home Xanax , which she takes on a every 6 hour as needed basis at home, to be resumed.  Currently his Xanax  is ordered on a nightly as needed basis.  Per my brief chart review, including review of daytime documentation, it appears that the patient has remained alert and oriented.  Per patient/patient's son's request, I have resumed home prn Xanax  at this time with order for Xanax  0.5 mg p.o. every 6 hours as needed for anxiety.     Eva Pore, DO Hospitalist

## 2024-02-17 NOTE — TOC Progression Note (Signed)
 Transition of Care [facility]) - Progression Note    Patient Details  Name: William Elliott MRN: [id number] Date of Birth: 09-07-1940  [facility] CM/SW Contact  Roxie KANDICE Stain, RN Phone Number: 02/17/2024, 3:25 PM  Clinical Narrative:    Spoke to patient and son at bedside regarding transition needs.  Patient is agreeable to home health. This RNCM offered choice for Home Health, [name] states he has no preference, RNCM made referral to [facility] with Hedda, He is able to take referral.  Address, Phone number and PCP verified.  ICM (Inpatient Care Management) will continue to follow.  Need home health PT, OT orders.   Expected Discharge Plan: Home w Home Health Services Barriers to Discharge: Continued Medical Work up               Expected Discharge Plan and Services   Discharge Planning Services: CM Consult Post Acute Care Choice: Home Health Living arrangements for the past 2 months: Single Family Home                           HH Arranged: OT, PT HH Agency: [facility] Date [facility] Agency Contacted: 02/17/24 Time HH Agency Contacted: 1525 Representative spoke with at [facility] Agency: Darleene   Social Drivers of Health (SDOH) Interventions SDOH Screenings   Food Insecurity: No Food Insecurity (02/15/2024)  Housing: Low Risk (02/15/2024)  Transportation Needs: No Transportation Needs (02/15/2024)  Utilities: Not At Risk (02/15/2024)  Alcohol Screen: Low Risk (09/14/2023)  Financial Resource Strain: Low Risk (09/14/2023)  Social Connections: Unknown (02/15/2024)  Tobacco Use: Medium Risk (02/16/2024)    Readmission Risk Interventions    09/13/2023    3:37 PM 01/22/2023   11:58 AM  Readmission Risk Prevention Plan  Post Dischage Appt  Complete  Medication Screening  Complete  Transportation Screening Complete Complete  PCP or Specialist Appt within 5-7 Days Complete   Home Care Screening Complete   Medication Review (RN CM) Complete

## 2024-02-17 NOTE — Progress Notes (Signed)
 eLink Physician-Brief Progress Note Patient Name: William Elliott DOB: January 11, 1941 MRN: [id number]   Date of Service  02/17/2024  HPI/Events of Note  Patient with bilateral coarse breath sounds and crackles.  eICU Interventions  CXR ordered.        Atalaya Zappia U Drey Shaff 02/17/2024, 6:02 AM

## 2024-02-17 NOTE — Evaluation (Signed)
 Clinical/Bedside Swallow Evaluation Patient Details  Name: William Elliott MRN: [id number] Date of Birth: 1940/11/06  Today's Date: 02/17/2024 Time: SLP Start Time (ACUTE ONLY): 1540 SLP Stop Time (ACUTE ONLY): 1555 SLP Time Calculation (min) (ACUTE ONLY): 15 min  Past Medical History:  Past Medical History:  Diagnosis Date   Acute lower GI bleeding 2008   Anxiety    Asthma    Carotid artery disease    Asymptomatic left carotid bruit   COPD (chronic obstructive pulmonary disease) (HCC)    Coronary artery disease    a. CABG - 1999 with LIMA-LAD, SVG-DIAG, SVG-OM, SVG-RPDA  b. Cath in setting of NSTEMI 03/18/2015: patent LIMA-LAD, occluded SVG-RCA, occluded SVG-D1, 99% stenosis of SVG-OM treated w/ DES   Depression    Dyslipidemia    Erectile dysfunction    Essential hypertension    History of blood transfusion 2008   History of stroke    Hyperlipidemia    Myocardial infarction [facility])    Past Surgical History:  Past Surgical History:  Procedure Laterality Date   CARDIAC CATHETERIZATION     CARDIAC CATHETERIZATION N/A 03/18/2015   Procedure: Left Heart Cath and Cors/Grafts Angiography;  Surgeon: Alm LELON Clay, MD;  Location: [facility] INVASIVE CV LAB;  Service: Cardiovascular;  Laterality: N/A;   CARDIAC CATHETERIZATION N/A 03/18/2015   Procedure: Coronary Stent Intervention;  Surgeon: Alm LELON Clay, MD;  Location: [facility] INVASIVE CV LAB;  Service: Cardiovascular;  Laterality: N/A;   CATARACT EXTRACTION W/ INTRAOCULAR LENS  IMPLANT, BILATERAL Bilateral    CORONARY ANGIOPLASTY     CORONARY ARTERY BYPASS GRAFT  1999   CORONARY STENT INTERVENTION N/A 04/22/2020   Procedure: CORONARY STENT INTERVENTION;  Surgeon: Claudene Victory LELON, MD;  Location: [facility] INVASIVE CV LAB;  Service: Cardiovascular;  Laterality: N/A;   FRACTURE SURGERY     HERNIA REPAIR     LEFT HEART CATH AND CORS/GRAFTS ANGIOGRAPHY N/A 04/22/2020   Procedure: LEFT HEART CATH AND CORS/GRAFTS ANGIOGRAPHY;  Surgeon: Claudene Victory LELON, MD;   Location: [facility] INVASIVE CV LAB;  Service: Cardiovascular;  Laterality: N/A;   TIBIA FRACTURE SURGERY Left [facility]; took bone out of my right hip   TONSILLECTOMY     UMBILICAL HERNIA REPAIR  ~ 2000   HPI:  83 yo male presenting to ED 12/15 for GIB. S/p EGD and colonoscopy 12/17. CXR 12/18 shows hazy and reticular opacities bilaterally, more pronounced in the lung bases, possibly reflecting alveolar and interstitial edema or pneumonitis. PMH includes CAD s/p CABG, chronic A-fib (not on anticoagulation d/t previous GIB), HTN, HLD, prior CVA (2017), COPD    Assessment / Plan / Recommendation  Clinical Impression  Will proceed with an MBS as scheduling allows for further assessment. Based on [name]'s report, pt's dysphagia does not appear to be acute in nature but would use increased caution while continuing current diet pending MBS. Give meds with puree and SLP will f/u.   Pt reports increased difficulty swallowing since GI procedures previous date but pt's son, William Elliott, reports coughing/choking with meals happening long before current admission. Audible secretions were noted prior to PO intake but hydrophonia increased after taking sips of thin liquids. Sips were consistently followed by coughing that did not resolve his vocal quality. Pt fed himself regular solids, masticating thoroughly and promptly clearing his oral cavity.  SLP Visit Diagnosis: Dysphagia, unspecified (R13.10)    Aspiration Risk  Mild aspiration risk    Diet Recommendation  Other Recommendations Oral Care Recommendations: Oral care BID     Swallow Evaluation Recommendations Recommendations: PO diet PO Diet Recommendation: Regular;Thin liquids (Level 0) Liquid Administration via: Cup;Straw Medication Administration: Whole meds with puree Supervision: Staff to assist with self-feeding;Full supervision/cueing for swallowing strategies Swallowing strategies  : Minimize environmental distractions;Slow  rate;Small bites/sips Postural changes: Position pt fully upright for meals Oral care recommendations: Oral care BID (2x/day)   Assistance Recommended at Discharge    Functional Status Assessment    Frequency and Duration            Prognosis Prognosis for improved oropharyngeal function: Good Barriers to Reach Goals: Time post onset      Swallow Study   General HPI: 83 yo male presenting to ED 12/15 for GIB. S/p EGD and colonoscopy 12/17. CXR 12/18 shows hazy and reticular opacities bilaterally, more pronounced in the lung bases, possibly reflecting alveolar and interstitial edema or pneumonitis. PMH includes CAD s/p CABG, chronic A-fib (not on anticoagulation d/t previous GIB), HTN, HLD, prior CVA (2017), COPD Type of Study: Bedside Swallow Evaluation Previous Swallow Assessment: none in chart Diet Prior to this Study: Regular;Thin liquids (Level 0) Temperature Spikes Noted: No Respiratory Status: Room air History of Recent Intubation: No Behavior/Cognition: Alert;Cooperative Oral Cavity Assessment: Within Functional Limits Oral Care Completed by SLP: No Oral Cavity - Dentition: Adequate natural dentition Vision: Functional for self-feeding Self-Feeding Abilities: Able to feed self Patient Positioning: Upright in bed Baseline Vocal Quality: Normal Volitional Cough: Weak Volitional Swallow: Able to elicit    Oral/Motor/Sensory Function Overall Oral Motor/Sensory Function: Within functional limits   Ice Chips Ice chips: Not tested   Thin Liquid Thin Liquid: Impaired Presentation: Straw Pharyngeal  Phase Impairments: Multiple swallows;Wet Vocal Quality;Cough - Immediate    Nectar Thick Nectar Thick Liquid: Not tested   Honey Thick Honey Thick Liquid: Not tested   Puree Puree: Not tested   Solid     Solid: Within functional limits Presentation: Self Fed      [name], M.A., CCC-SLP Speech Language Pathology, Acute Rehabilitation Services  Secure Chat  preferred [phone]  02/17/2024,4:39 PM

## 2024-02-17 NOTE — Progress Notes (Signed)
 Patient ID: William Elliott, male   DOB: [birth year], 83 y.o.   MRN: [id number]    Progress Note   Subjective   Day # 3 CC; acute recurrent major lower GI bleed  EGD yesterday-normal Colon-2 AVMs seen at the hepatic flexure and then in the ascending colon treated with APC multiple medium and small mouth diverticuli throughout the colon none with stigmata of active bleeding  Today's patient's birthday, he says he is feeling okay, currently trying to get up in the chair with physical therapy, son says he sounded a bit congested last evening.  He has not had any further bowel movements overnight  Chest x-ray-mild cardiomegaly, hazy and reticular opacities bilaterally more pronounced in the bases possible alveolar and interstitial edema or pneumonitis  Labs today-WBC 20.8/hemoglobin 7.5/hematocrit 21.5 down trended from 8 Sodium 133/potassium 3.6/BUN 47/creatinine 2.13     Objective   Vital signs in last 24 hours: Temp:  [97.7 F (36.5 C)-99.8 F (37.7 C)] 97.7 F (36.5 C) (12/18 1120) Pulse Rate:  [54-106] 61 (12/18 1100) Resp:  [11-31] 26 (12/18 1100) BP: (105-151)/(44-77) 115/51 (12/18 1100) SpO2:  [90 %-100 %] 96 % (12/18 1100) Weight:  [69.6 kg] 69.6 kg (12/18 0600) Last BM Date : 02/16/24 General:   Very elderly white male in NAD Heart:  Regular rate and rhythm; no murmurs Lungs: Respirations even and unlabored, lateral basilar crackles Abdomen:  Soft, nontender and nondistended. Normal bowel sounds. Extremities:  Without edema. Neurologic:  Alert and oriented,  grossly normal neurologically. Psych:  Cooperative. Normal mood and affect.  Intake/Output from previous day: 12/17 0701 - 12/18 0700 In: 620 [P.O.:120; I.V.:400; IV Piggyback:100] Out: 350 [Urine:350] Intake/Output this shift: No intake/output data recorded.  Lab Results: Recent Labs    02/16/24 0234 02/16/24 1819 02/17/24 0444  WBC 21.3* 36.4* 20.8*  HGB 8.7* 8.0* 7.5*  HCT 25.5* 23.3* 21.1*  PLT  238 241 155   BMET Recent Labs    02/15/24 0611 02/16/24 0234 02/17/24 0444  NA 140 137 133*  K 4.9 4.1 3.6  CL 105 103 101  CO2 22 20* 22  GLUCOSE 126* 158* 139*  BUN 28* 41* 47*  CREATININE 1.34* 2.13* 2.13*  CALCIUM  8.4* 8.4* 8.0*   LFT Recent Labs    02/14/24 2348  PROT 5.3*  ALBUMIN 2.8*  AST 35  ALT 15  ALKPHOS 56  BILITOT 1.3*   PT/INR Recent Labs    02/15/24 0155 02/16/24 0234  LABPROT 17.3* 17.6*  INR 1.3* 1.4*    Studies/Results: DG Chest Port 1 View Result Date: 02/17/2024 EXAM: 1 VIEW(S) XRAY OF THE CHEST 02/17/2024 06:25:02 AM COMPARISON: 02/15/2024 CLINICAL HISTORY: Coarse respiratory crackles FINDINGS: LUNGS AND PLEURA: There are hazy and reticular opacities present bilaterally, more pronounced in the lung bases, which could reflect alveolar and interstitial edema or pneumonitis. No pleural effusion. No pneumothorax. HEART AND MEDIASTINUM: Mild cardiomegaly. Aortic arch atherosclerosis. CABG markers noted. BONES AND SOFT TISSUES: Median sternotomy wires and CABG markers noted. IMPRESSION: 1. Hazy and reticular opacities bilaterally, more pronounced in the lung bases, possibly reflecting alveolar and interstitial edema or pneumonitis. 2. Mild cardiomegaly and aortic arch atherosclerosis. Electronically signed by: Evalene Coho MD 02/17/2024 06:45 AM EST RP Workstation: [id number]   CT HEAD WO CONTRAST ( ) Result Date: 02/15/2024 EXAM: CT HEAD WITHOUT CONTRAST 02/15/2024 05:31:00 PM TECHNIQUE: CT of the head was performed without the administration of additional intravenous contrast. Residual intravenous contrast material from a CT performed earlier the same day is  noted. Automated exposure control, iterative reconstruction, and/or weight based adjustment of the mA/kV was utilized to reduce the radiation dose to as low as reasonably achievable. COMPARISON: None available. CLINICAL HISTORY: Head trauma, minor (Age >= 65y) FINDINGS: BRAIN AND VENTRICLES: No  acute hemorrhage. No evidence of acute infarct. No hydrocephalus. No extra-axial collection. No mass effect or midline shift. Left frontoparietal encephalomalacia. Multiple scattered remote cerebellar infarcts noted. Moderate global atrophy. Patchy supratentorial white matter hypoattenuation, possibly reflecting chronic microvascular ischemic changes. Intracranial atherosclerosis. 5 mm right MCA bifurcation aneurysm noted (series 8, image 19). ORBITS: No acute abnormality. SINUSES: No acute abnormality. SOFT TISSUES AND SKULL: Right parietal scalp contusion. No skull fracture. IMPRESSION: 1. No acute intracranial abnormality related to the head trauma. 2. Right parietal scalp contusion. 3. 5 mm right MCA bifurcation aneurysm. Electronically signed by: Morene Hoard MD 02/15/2024 06:19 PM EST RP Workstation: [id number]       Assessment / Plan:    #52 83 year old male with history of recurrent lower GI bleeding presumed diverticular who presented back to the emergency room after onset of acute grossly bloody painless bowel movements associated with weakness and dizziness.  No current anticoagulation  Initial hemoglobin 8.3 down from the last documented hemoglobin at 9.4.  CTA was negative for acute bleeding noted to have diffuse diverticulosis  Further active bleeding and underwent repeat CTA on 02/15/2024 which was positive at the hepatic flexure however patient has significant mesenteric vascular disease and no good access for angiography and embolization per IR  Tolerated bowel prep and underwent EGD and colonoscopy yesterday with finding of 2 AVMs at the the hepatic flexure/ascending colon both treated with APC and multiple diverticuli without evidence of active bleeding  Patient has been stable overnight but hemoglobin has drifted to 7.5 probably equilibration no further bowel movements  #2 acute kidney injury multifactoral #3 bilateral interstitial opacities, pneumonitis versus  edema  #4 leukocytosis persisted but improved over yesterday #5 history of congestive heart failure 6.  Coronary artery disease status post remote CABG and stent in 7 COPD  Plan; continue to observe, okay for regular diet Discussed with hospitalist, if hemoglobin drifts any lower than 7.5 would favor transfusing 1 more unit of packed RBCs Management of pneumonitis versus mild volume overload as per primary team I will follow-up in a.m.      Principal Problem:   Acute bleeding Active Problems:   Abnormal CT scan, stomach   Angiodysplasia of colon with hemorrhage   Diverticulosis of colon     LOS: 2 days   Jerimah Witucki PA-C 02/17/2024, 11:58 AM

## 2024-02-17 NOTE — Evaluation (Signed)
 Occupational Therapy Evaluation Patient Details Name: William Elliott MRN: [id number] DOB: 09/03/1940 Today's Date: 02/17/2024   History of Present Illness   83 yo male presented 12/15 for acute GIB.PMH:  coronary artery disease, status post prior CABG 1999, and drug-eluting stent 2017.  NSTEMI July 2025.     Clinical Impressions At baseline, pt is Independent to Mod I with ADLs and medication management and performs functional mobility Independent to Mod I with intermittent use of a RW. Pt with one fall just prior to this admission. Pt drives; however, pt's son reports pt has gotten lost while driving since moving to their current home. [name] and DIL assist with meal prep and home management tasks. Pt now presents with decreased activity tolerance, decreased balance, mildly decreased cognition, generalized B UE weakness, mildly decreased B UE fine motor coordination, increased fatigue, impaired cardiopulmonary status, and decreased safety and independence with functional tasks. Pt currently demonstrating ability to largely complete ADLs with Set up to Max assist and functional transfers with a RW with Contact guard to Min assist. Pt with O2 sat >/94% on RA, HR in the 60s-70s, and BP soft but stable with pt reporting mildly dizziness in sitting and standing. OT initiated education this session in strategies for managing, tracking, and appropriately responding to episodes of dizziness and/or orthostatic hypotension with pt verbalizing understanding of training. Pt will benefit from reinforcement of education. Pt participated well in session, is motivated to return to PLOF, and has good family support. Pt will benefit from acute OT services to address deficits and increase safety and independence with functional tasks. Post acute discharge, pt will benefit from HHOT to maximize rehab potential paired with assistance of family in the home. OT also recommends use of BSC overnight at home to decrease risk  of falls.      If plan is discharge home, recommend the following:   A little help with walking and/or transfers;A lot of help with bathing/dressing/bathroom;Assistance with cooking/housework;Direct supervision/assist for medications management;Direct supervision/assist for financial management;Assist for transportation;Help with stairs or ramp for entrance     Functional Status Assessment   Patient has had a recent decline in their functional status and demonstrates the ability to make significant improvements in function in a reasonable and predictable amount of time.     Equipment Recommendations   BSC/3in1     Recommendations for Other Services         Precautions/Restrictions   Precautions Precautions: Fall Recall of Precautions/Restrictions: Intact Precaution/Restrictions Comments: watch orthostatics Restrictions Weight Bearing Restrictions Per Provider Order: No     Mobility Bed Mobility Overal bed mobility: Needs Assistance Bed Mobility: Supine to Sit, Sit to Supine     Supine to sit: Min assist, HOB elevated, Used rails Sit to supine: Min assist, Used rails, HOB elevated   General bed mobility comments: Min assist to pull through therapist's hand with RUE. Scoots to EOB, slowly. Min assist to elevate B LE into bed. Mild dizziness reported sitting EOB. Educated on safety and awareness, and strateigeis for managing dizziness and episodes of orthostatic hypotention with pt verbalizing understanding; pt will benefit from reinforcement of education.    Transfers Overall transfer level: Needs assistance Equipment used: Rolling walker (2 wheels) Transfers: Sit to/from Stand Sit to Stand: Min assist           General transfer comment: Min assist for boost to stand from bed x2. Cues for technique and hand placement. Mild dizziness reported each time. Educated on safety and  awareness.      Balance Overall balance assessment: Needs  assistance Sitting-balance support: No upper extremity supported, Feet supported Sitting balance-Leahy Scale: Good     Standing balance support: Bilateral upper extremity supported, Reliant on assistive device for balance, During functional activity Standing balance-Leahy Scale: Poor                             ADL either performed or assessed with clinical judgement   ADL Overall ADL's : Needs assistance/impaired Eating/Feeding: Set up;Sitting   Grooming: Supervision/safety;Set up;Sitting   Upper Body Bathing: Contact guard assist;Sitting   Lower Body Bathing: Maximal assistance;Sit to/from stand;Cueing for compensatory techniques   Upper Body Dressing : Contact guard assist;Sitting   Lower Body Dressing: Maximal assistance;Sit to/from stand;Cueing for compensatory techniques   Toilet Transfer: Contact guard assist;Minimal assistance;Cueing for sequencing;Rolling walker (2 wheels);BSC/3in1 (step-pivot transfer) Toilet Transfer Details (indicate cue type and reason): simulated at EOB Toileting- Clothing Manipulation and Hygiene: Maximal assistance;Sit to/from stand;Cueing for compensatory techniques       Functional mobility during ADLs:  (deferred this session per pt request due to fatigue) General ADL Comments: Pt with decreased activity tolerance, fatiguing quickly during tasks.     Vision Baseline Vision/History: 1 Wears glasses (not present in room during session; hx of cataracts with sx 2 or 3 years ago) Ability to See in Adequate Light: 1 Impaired (without glasses on) Patient Visual Report: No change from baseline Vision Assessment?: Wears glasses for reading Additional Comments: Pt typically wears glasses for reading. Pt with difficulty reading small print this session without glasses on, but able to read larger print and large print calendar on the opposite wall. Pt reports vision is at baseline.     Perception         Praxis         Pertinent  Vitals/Pain Pain Assessment Pain Assessment: No/denies pain     Extremity/Trunk Assessment Upper Extremity Assessment Upper Extremity Assessment: Right hand dominant;Generalized weakness;RUE deficits/detail;LUE deficits/detail RUE Deficits / Details: generalized weakness; ROM and sensation WFL; mildly decreased fine motor coordination RUE Coordination: decreased fine motor (mild) LUE Deficits / Details: generalized weakness; ROM and sensation WFL; mildly decreased fine motor coordination; edematous hand, wrist, and forearm LUE Coordination: decreased fine motor (mild)   Lower Extremity Assessment Lower Extremity Assessment: Defer to PT evaluation       Communication Communication Communication: Impaired Factors Affecting Communication: Hearing impaired   Cognition Arousal: Alert Behavior During Therapy: WFL for tasks assessed/performed Cognition: Cognition impaired, Difficult to assess Difficult to assess due to: Hard of hearing/deaf   Awareness: Intellectual awareness intact, Online awareness intact (intermittent online awareness) Memory impairment (select all impairments): Short-term memory Attention impairment (select first level of impairment): Alternating attention Executive functioning impairment (select all impairments): Organization, Problem solving, Reasoning OT - Cognition Comments: Pt AAOx4 and pleasant throughout session with cognition largely [facility], but with noted deficits in short-term memory, safety awareness, and problem solving. Pt also requiring repeated instructions and questions, but suspect this is largely due to being [facility].                 Following commands: Intact       Cueing  General Comments   Cueing Techniques: Verbal cues;Gestural cues  Pt with O2 sat >/94% on RA, HR in the 60s-70s, and BP soft but stable with pt reporting mildly dizziness in sitting and standing. [name] present and supportive throughout session. RN  present during a portion  of session.   Exercises     Shoulder Instructions      Home Living Family/patient expects to be discharged to:: Private residence Living Arrangements: Children;Spouse/significant other (son, daughteer-in-law, and wife) Available Help at Discharge: Family;Available 24 hours/day Type of Home: House Home Access: Level entry     Home Layout: Two level;Able to live on main level with bedroom/bathroom;Other (Comment) (Pt and his wife live in the lower level of the home. There is a back door to the outside with a level entry and walkway leading to the front of the house and a stairway with stair lift leading up to the main level of the house with the kitchen.) Alternate Level Stairs-Number of Steps: flight (with stair lift)   Bathroom Shower/Tub: Chief Strategy Officer: Standard Bathroom Accessibility: Yes How Accessible: Accessible via walker Home Equipment: Rolling Walker (2 wheels);Cane - quad;Cane - single point;Electric scooter;Grab bars - tub/shower;Hand held shower head;Shower seat - built in   Additional Comments: [name] reports the [employer] will be providing an elevator for the home in the near future.      Prior Functioning/Environment Prior Level of Function : Independent/Modified Independent;History of Falls (last six months);Driving             Mobility Comments: Recent fall when returning to bed after using the bathroom, son assisted. Pt states he typically does not use AD but has been using RW for about 1 week due to increased weakness. Pt ans son also reprot intermittent use of RW in the past. ADLs Comments: Pt is typically Independent to Mod I with ADL and medication management. Pt reports he has intermittent assistance from for donning/doffing socks due to intermittent dizziness. Pt reports he drives with son adding the family recently moved and the pt has gotten lost multiple times while driving since moving. Son and DIL assist with meal prep, other home  management tasks, and transportation PRN.    OT Problem List: Decreased strength;Decreased activity tolerance;Impaired balance (sitting and/or standing);Decreased coordination;Decreased cognition;Decreased safety awareness;Decreased knowledge of use of DME or AE;Decreased knowledge of precautions   OT Treatment/Interventions: Self-care/ADL training;Therapeutic exercise;Energy conservation;DME and/or AE instruction;Therapeutic activities;Cognitive remediation/compensation;Patient/family education;Balance training      OT Goals(Current goals can be found in the care plan section)   Acute Rehab OT Goals Patient Stated Goal: to feel better, return home, and stay independent OT Goal Formulation: With patient/family Time For Goal Achievement: 03/02/24 Potential to Achieve Goals: Good ADL Goals Pt Will Perform Grooming: with supervision;standing Pt Will Perform Lower Body Bathing: with supervision;sit to/from stand (with adaptive equipment as needed) Pt Will Perform Lower Body Dressing: with supervision;sit to/from stand (with adaptive equipment as needed) Pt Will Transfer to Toilet: with supervision;ambulating;regular height toilet (with least restrictive AD) Pt/caregiver will Perform Home Exercise Program: Increased strength;Both right and left upper extremity;With theraband;With Supervision;With written HEP provided (Increased activity tolerance) Additional ADL Goal #1: Patient will demonstrate understanding through teach back of education in strategies for managing, tracking, and appropriately responding to episodes of orthostatic hypotention with handout provided. Additional ADL Goal #2: Patient will demonstrate ability to appropriately set up a weekly medication planner with Mod I.   OT Frequency:  Min 2X/week    Co-evaluation              AM-PAC OT 6 Clicks Daily Activity     Outcome Measure Help from another person eating meals?: A Little Help from another person taking care  of personal  grooming?: A Little Help from another person toileting, which includes using toliet, bedpan, or urinal?: A Lot Help from another person bathing (including washing, rinsing, drying)?: A Lot Help from another person to put on and taking off regular upper body clothing?: A Little Help from another person to put on and taking off regular lower body clothing?: A Lot 6 Click Score: 15   End of Session Equipment Utilized During Treatment: Rolling walker (2 wheels);Gait belt Nurse Communication: Mobility status;Other (comment) (Pt would benefit from an SLP consult due to pt with coughing when drinking water in sitting and in supine with HOB elevated during session.)  Activity Tolerance: Patient tolerated treatment well;Patient limited by fatigue Patient left: in bed;with call bell/phone within reach;with family/visitor present  OT Visit Diagnosis: Unsteadiness on feet (R26.81);Other abnormalities of gait and mobility (R26.89);Muscle weakness (generalized) (M62.81);History of falling (Z91.81);Other symptoms and signs involving cognitive function                Time: 1239-1315 OT Time Calculation (min): 36 min Charges:  OT General Charges $OT Visit: 1 Visit OT Evaluation $OT Eval Moderate Complexity: 1 Mod OT Treatments $Self Care/Home Management : 8-22 mins  William Rockey HERO., OTR/L, MA Acute Rehab [phone]   William Elliott 02/17/2024, 1:53 PM

## 2024-02-17 NOTE — Hospital Course (Signed)
 William Elliott is a 83 y.o. male with a history of CAD status post DES and CABG, chronic atrial fibrillation, hypertension, hyperlipidemia, heart failure with reduced EF, GI bleeding, CVA.  Patient presented secondary to bright red blood per rectum consistent with GI bleeding.  Patient with associated hemorrhagic shock requiring ICU admission.  Gastroenterology consulted and performed an upper GI endoscopy in addition to a colonoscopy, with the colonoscopy revealing 2 angiectasia's which were treated with APC in addition to severe diverticulosis throughout the colon.  Patient has required a total of 4 units of PRBC, 1 unit of FFP, and 1 unit of platelets.  Hospitalization further complicated by development of pneumonia in addition to acute heart failure.

## 2024-02-17 NOTE — Evaluation (Signed)
 Physical Therapy Evaluation Patient Details Name: William Elliott MRN: [id number] DOB: 1940-06-12 Today's Date: 02/17/2024  History of Present Illness  83 yo male presented 12/15 for acute GIB.PMH:  coronary artery disease, status post prior CABG 1999, and drug-eluting stent 2017.  NSTEMI July 2025.  Clinical Impression  Pt admitted with above diagnosis. Lives with wife and son. Recent fall and starting to use assistive device due to onset of weakness. Pt required min assist for bed mobility and transfer from bed to stand. Utilizing a RW for support, able to walk forward several feet with CGA for safety. Getting a bit dizzy while standing (see BP below.) Anticipate continued functional progress while admitted. Would benefit from HHPT follow-up to continue building independence and functional capacity. Pt currently with functional limitations due to the deficits listed below (see PT Problem List). Pt will benefit from acute skilled PT to increase their independence and safety with mobility to allow discharge.      SpO2 95% on RA, HR 50s-70s throughout at rest and mobilizing respectively. BP 117/53 (MAP 69) supine. Seated BP 105/85. Standing BP 103/50 (MAP 67).       If plan is discharge home, recommend the following: A little help with walking and/or transfers;A little help with bathing/dressing/bathroom;Assistance with cooking/housework;Assist for transportation;Help with stairs or ramp for entrance   Can travel by private vehicle        Equipment Recommendations None recommended by PT  Recommendations for Other Services       Functional Status Assessment Patient has had a recent decline in their functional status and demonstrates the ability to make significant improvements in function in a reasonable and predictable amount of time.     Precautions / Restrictions Precautions Precautions: Fall Recall of Precautions/Restrictions: Intact Precaution/Restrictions Comments: watch  orthostatics Restrictions Weight Bearing Restrictions Per Provider Order: No      Mobility  Bed Mobility Overal bed mobility: Needs Assistance Bed Mobility: Supine to Sit     Supine to sit: Min assist, HOB elevated, Used rails     General bed mobility comments: Min assist to pull through therapist's hand with RUE. Scoots to EOB, slowly.    Transfers Overall transfer level: Needs assistance Equipment used: Rolling walker (2 wheels) Transfers: Sit to/from Stand Sit to Stand: Min assist           General transfer comment: Min assist for boost to stand from bed x2. Cues for technique and hand placement. Mild dizziness reported each time. Educated on safety and awareness.    Ambulation/Gait Ambulation/Gait assistance: Contact guard assist Gait Distance (Feet): 6 Feet Assistive device: Rolling walker (2 wheels) Gait Pattern/deviations: Step-through pattern, Decreased stride length, Shuffle, Trunk flexed Gait velocity: dec Gait velocity interpretation: <1.31 ft/sec, indicative of household ambulator   General Gait Details: Educated on safe AD use with RW for support, cues for upright stance and foot clearance. No buckling noted.  Stairs            Wheelchair Mobility     Tilt Bed    Modified Rankin (Stroke Patients Only)       Balance Overall balance assessment: Needs assistance Sitting-balance support: No upper extremity supported, Feet supported Sitting balance-Leahy Scale: Good     Standing balance support: Bilateral upper extremity supported, Reliant on assistive device for balance Standing balance-Leahy Scale: Poor  Pertinent Vitals/Pain Pain Assessment Pain Assessment: No/denies pain    Home Living Family/patient expects to be discharged to:: Private residence Living Arrangements: Children Available Help at Discharge: Family;Available 24 hours/day Type of Home: House Home Access: Level entry        Home Layout: Two level;Able to live on main level with bedroom/bathroom Home Equipment: Rolling Walker (2 wheels);Cane - quad;Cane - single point;Electric scooter      Prior Function Prior Level of Function : Independent/Modified Independent;History of Falls (last six months)             Mobility Comments: Recent fall, son assisted. Pt states he typically does not use AD but has been using RW since getting weak. ADLs Comments: ind     Extremity/Trunk Assessment   Upper Extremity Assessment Upper Extremity Assessment: Defer to OT evaluation    Lower Extremity Assessment Lower Extremity Assessment: Generalized weakness       Communication   Communication Communication: Impaired Factors Affecting Communication: Hearing impaired    Cognition Arousal: Alert Behavior During Therapy: WFL for tasks assessed/performed   PT - Cognitive impairments: No family/caregiver present to determine baseline                         Following commands: Intact       Cueing Cueing Techniques: Verbal cues, Gestural cues     General Comments General comments (skin integrity, edema, etc.): SpO2 95% on RA, HR 50s-70s throughout visit. BP 117/53 (MAP 69) supine.  Seated BP 105/85. Standing BP 103/50 (MAP 67).    Exercises     Assessment/Plan    PT Assessment Patient needs continued PT services  PT Problem List Decreased strength;Decreased activity tolerance;Decreased balance;Decreased mobility;Decreased cognition;Decreased knowledge of use of DME;Cardiopulmonary status limiting activity       PT Treatment Interventions DME instruction;Gait training;Stair training;Functional mobility training;Therapeutic activities;Therapeutic exercise;Balance training;Neuromuscular re-education;Patient/family education;Cognitive remediation    PT Goals (Current goals can be found in the Care Plan section)  Acute Rehab PT Goals Patient Stated Goal: Go home PT Goal Formulation: With  patient/family Time For Goal Achievement: 03/02/24 Potential to Achieve Goals: Good    Frequency Min 2X/week     Co-evaluation               AM-PAC PT 6 Clicks Mobility  Outcome Measure Help needed turning from your back to your side while in a flat bed without using bedrails?: A Little Help needed moving from lying on your back to sitting on the side of a flat bed without using bedrails?: A Little Help needed moving to and from a bed to a chair (including a wheelchair)?: A Little Help needed standing up from a chair using your arms (e.g., wheelchair or bedside chair)?: A Little Help needed to walk in hospital room?: A Little Help needed climbing 3-5 steps with a railing? : A Lot 6 Click Score: 17    End of Session Equipment Utilized During Treatment: Gait belt Activity Tolerance: Patient tolerated treatment well;Patient limited by fatigue Patient left: in chair;with call bell/phone within reach;with chair alarm set;with SCD's reapplied (MD in room) Nurse Communication: Mobility status PT Visit Diagnosis: Unsteadiness on feet (R26.81);Other abnormalities of gait and mobility (R26.89);Muscle weakness (generalized) (M62.81);History of falling (Z91.81);Difficulty in walking, not elsewhere classified (R26.2)    Time: 9099-9074 PT Time Calculation (min) (ACUTE ONLY): 25 min   Charges:   PT Evaluation $PT Eval Moderate Complexity: 1 Mod PT Treatments $Therapeutic Activity: 8-22 mins PT  General Charges $$ ACUTE PT VISIT: 1 Visit         Leontine Roads, PT, DPT [facility]  Rehabilitation Services Physical Therapist Office: [phone] Website: [email or web address]   Leontine GORMAN Roads 02/17/2024, 11:13 AM

## 2024-02-17 NOTE — Progress Notes (Signed)
 PROGRESS NOTE    William Elliott  FMW:[id number] DOB: [birth year] DOA: 02/14/2024 PCP: [name], MD   Brief Narrative: William Elliott is a 83 y.o. male with a history of CAD status post DES and CABG, chronic atrial fibrillation, hypertension, hyperlipidemia, heart failure with reduced EF, GI bleeding, CVA.  Patient presented secondary to bright red blood per rectum consistent with GI bleeding.  Patient with associated hemorrhagic shock requiring ICU admission.  Gastroenterology consulted and performed an upper GI endoscopy in addition to a colonoscopy, with the colonoscopy revealing 2 angiectasia's which were treated with APC in addition to severe diverticulosis throughout the colon.  Patient has required a total of 4 units of PRBC, 1 unit of FFP, and 1 unit of platelets.  Hospitalization further complicated by development of pneumonia in addition to acute heart failure.   Assessment and Plan:  GI bleeding Hematochezia Patient with history of diverticulosis.  Patient with significant GI bleeding leading to hemorrhagic shock.  Gastroenterology was consulted and performed an upper GI endoscopy in addition to colonoscopy.  Upper GI endoscopy was unremarkable and colonoscopy was remarkable for two colonic angiectasias around the hepatic flexure treated with APC in addition to severe diverticulosis in the sigmoid colon, descending colon, hepatic flexure, ascending colon and cecum.  Hemorrhagic shock Acute blood loss anemia Secondary to significant GI bleeding. Patient required ICU admission. He was managed with IV fluids and several blood transfusions; no vasopressor needs. Shock resolved with resuscitation. Patient required a total of 4 units of PRBC, 1 unit of FFP and 1 unit of platelets. Hemoglobin drifted down to 7.5 today. - GI recommendations: recommendation to keep hemoglobin >8 - Will order 1 unit of PRBC and follow with Lasix  IV  CAD S/p CABG. No chest pain.  Acute on chronic  HFrEF Prior to arrival medication(s) include Jardiance , furosemide /potassium chloride , Imdur , losartan , spironolactone .  Last echo from July 2025 significant for an LVEF of 35 to 40% in addition to mildly reduced and moderately enlarged right ventricle.  Patient has received multiple IV fluid boluses and transfusions this admission for hemorrhagic shock.  Chest x-ray from 12/18 concerning for possible interstitial edema. - Start Lasix  IV - Daily BMP while on Lasix  IV - Strict ins and outs and daily weights  Possible community acquired pneumonia Patient with congestion in addition to mild patchy bilateral lower lobe opacities concerning for possible pneumonia.  Patient started on empiric ceftriaxone  while in the ICU. Concern with patient coughing during oral intake of fluids. - Continue ceftriaxone  IV - SLP evaluation  Primary hypertension Prior to arrival medication(s) include amlodipine , losartan , Imdur  and spironolactone .  Chronic atrial fibrillation Patient is not on rate control or anticoagulation as an outpatient.  Currently rate controlled.  Leukocytosis Patient appears to have chronic leukocytosis. Possibly stress related, although patient does have concern for possible pneumonia. Peak WBC of 36,400, which may have been an aberrant result. - CBC in AM  AKI Baseline creatinine appears to be around 1.1 to 1.3. Creatinine of 1.47 and has worsened to 2.13. Complicated by hypotension in addition to multiple doses of IV contrast for CTA imaging. Patient also has evidence of fluid overload. Creatinine has stabilized at 2.13. - BMP in AM   DVT prophylaxis: SCDs Code Status:   Code Status: Full Code Family Communication: None at bedside Disposition Plan: Discharge pending stable hemoglobin in addition to improvement of fluid status   Consultants:  PCCM Gastroenterology  Procedures:  Upper GI endoscopy Colonoscopy  Antimicrobials: Ceftriaxone   Subjective: Patient reports no  issues this morning.  When discussing his overnight events, patient states that he had no issues with breathing overnight.  He reports not having significant cough.  No chest pain.  Objective: BP (!) 109/44   Pulse (!) 56   Temp 97.9 F (36.6 C) (Oral)   Resp 18   Ht 5' 10 (1.778 m)   Wt 69.6 kg   SpO2 92%   BMI 22.02 kg/m   Examination:  General exam: Appears calm and comfortable. Respiratory system: Clear to auscultation. Respiratory effort normal. Cardiovascular system: S1 & S2 heard. Gastrointestinal system: Abdomen is nondistended, soft and nontender. Normal bowel sounds heard. Central nervous system: Alert and oriented. No focal neurological deficits. Musculoskeletal: Bilateral lower extremity edema up to thighs. No calf tenderness Psychiatry: Judgement and insight appear normal. Mood & affect appropriate.    Data Reviewed: I have personally reviewed following labs and imaging studies  CBC Lab Results  Component Value Date   WBC 20.8 (H) 02/17/2024   RBC 2.47 (L) 02/17/2024   HGB 7.5 (L) 02/17/2024   HCT 21.1 (L) 02/17/2024   MCV 85.4 02/17/2024   MCH 30.4 02/17/2024   PLT 155 02/17/2024   MCHC 35.5 02/17/2024   RDW 17.0 (H) 02/17/2024   LYMPHSABS 0.8 02/17/2024   MONOABS 1.1 (H) 02/17/2024   EOSABS 0.0 02/17/2024   BASOSABS 0.0 02/17/2024     Last metabolic panel Lab Results  Component Value Date   NA 133 (L) 02/17/2024   K 3.6 02/17/2024   CL 101 02/17/2024   CO2 22 02/17/2024   BUN 47 (H) 02/17/2024   CREATININE 2.13 (H) 02/17/2024   GLUCOSE 139 (H) 02/17/2024   GFRNONAA 30 (L) 02/17/2024   GFRAA >60 12/07/2015   CALCIUM  8.0 (L) 02/17/2024   PHOS 2.9 01/21/2023   PROT 5.3 (L) 02/14/2024   ALBUMIN 2.8 (L) 02/14/2024   BILITOT 1.3 (H) 02/14/2024   ALKPHOS 56 02/14/2024   AST 35 02/14/2024   ALT 15 02/14/2024   ANIONGAP 11 02/17/2024    GFR: Estimated Creatinine Clearance: 26.3 mL/min (A) (by C-G formula based on SCr of 2.13 mg/dL  (H)).  Recent Results (from the past 240 hours)  Blood Culture (routine x 2)     Status: None (Preliminary result)   Collection Time: 02/15/24  1:55 AM   Specimen: BLOOD  Result Value Ref Range Status   Specimen Description BLOOD RIGHT ANTECUBITAL  Final   Special Requests   Final    BOTTLES DRAWN AEROBIC AND ANAEROBIC Blood Culture adequate volume   Culture   Final    NO GROWTH 2 DAYS Performed at [facility], [address] [place], [place] [address]    Report Status PENDING  Incomplete  Blood Culture (routine x 2)     Status: None (Preliminary result)   Collection Time: 02/15/24  2:00 AM   Specimen: BLOOD  Result Value Ref Range Status   Specimen Description BLOOD LEFT ANTECUBITAL  Final   Special Requests   Final    BOTTLES DRAWN AEROBIC AND ANAEROBIC Blood Culture adequate volume   Culture   Final    NO GROWTH 2 DAYS Performed at [facility], [address] [place], [place] [address]    Report Status PENDING  Incomplete  MRSA Next Gen by PCR, Nasal     Status: None   Collection Time: 02/15/24  4:22 AM   Specimen: Nasal Mucosa; Nasal Swab  Result Value Ref Range  Status   MRSA by PCR Next Gen NOT DETECTED NOT DETECTED Final    Comment: (NOTE) The GeneXpert MRSA Assay (FDA approved for NASAL specimens only), is one component of a comprehensive MRSA colonization surveillance program. It is not intended to diagnose MRSA infection nor to guide or monitor treatment for MRSA infections. Test performance is not FDA approved in patients less than 12 years old. Performed at [facility], [address] [place], [place] [address]       Radiology Studies: DG Chest Port 1 View Result Date: 02/17/2024 EXAM: 1 VIEW(S) XRAY OF THE CHEST 02/17/2024 06:25:02 AM COMPARISON: 02/15/2024 CLINICAL HISTORY: Coarse respiratory crackles FINDINGS: LUNGS AND PLEURA: There are hazy and reticular opacities present bilaterally, more pronounced in the lung bases, which could  reflect alveolar and interstitial edema or pneumonitis. No pleural effusion. No pneumothorax. HEART AND MEDIASTINUM: Mild cardiomegaly. Aortic arch atherosclerosis. CABG markers noted. BONES AND SOFT TISSUES: Median sternotomy wires and CABG markers noted. IMPRESSION: 1. Hazy and reticular opacities bilaterally, more pronounced in the lung bases, possibly reflecting alveolar and interstitial edema or pneumonitis. 2. Mild cardiomegaly and aortic arch atherosclerosis. Electronically signed by: Evalene Coho MD 02/17/2024 06:45 AM EST RP Workstation: [id number]   CT HEAD WO CONTRAST ( ) Result Date: 02/15/2024 EXAM: CT HEAD WITHOUT CONTRAST 02/15/2024 05:31:00 PM TECHNIQUE: CT of the head was performed without the administration of additional intravenous contrast. Residual intravenous contrast material from a CT performed earlier the same day is noted. Automated exposure control, iterative reconstruction, and/or weight based adjustment of the mA/kV was utilized to reduce the radiation dose to as low as reasonably achievable. COMPARISON: None available. CLINICAL HISTORY: Head trauma, minor (Age >= 65y) FINDINGS: BRAIN AND VENTRICLES: No acute hemorrhage. No evidence of acute infarct. No hydrocephalus. No extra-axial collection. No mass effect or midline shift. Left frontoparietal encephalomalacia. Multiple scattered remote cerebellar infarcts noted. Moderate global atrophy. Patchy supratentorial white matter hypoattenuation, possibly reflecting chronic microvascular ischemic changes. Intracranial atherosclerosis. 5 mm right MCA bifurcation aneurysm noted (series 8, image 19). ORBITS: No acute abnormality. SINUSES: No acute abnormality. SOFT TISSUES AND SKULL: Right parietal scalp contusion. No skull fracture. IMPRESSION: 1. No acute intracranial abnormality related to the head trauma. 2. Right parietal scalp contusion. 3. 5 mm right MCA bifurcation aneurysm. Electronically signed by: Morene Hoard MD  02/15/2024 06:19 PM EST RP Workstation: [id number]   CT ANGIO GI BLEED Result Date: 02/15/2024 CLINICAL DATA:  Lower GI bleed (Ped 0-17y) EXAM: CTA ABDOMEN AND PELVIS WITHOUT AND WITH CONTRAST TECHNIQUE: Initially, a noncontrast CT of the abdomen and pelvis was performed. Subsequently, multidetector CT imaging of the abdomen and pelvis was performed using the standard protocol during bolus administration of intravenous contrast. Multiplanar reconstructed images and MIPs were obtained and reviewed to evaluate the vascular anatomy. RADIATION DOSE REDUCTION: This exam was performed according to the departmental dose-optimization program which includes automated exposure control, adjustment of the mA and/or kV according to patient size and/or use of iterative reconstruction technique. CONTRAST:  OMNIPAQUE  IOHEXOL  350 MG/ML SOLN COMPARISON:  02/15/2024 at 12:18 a.m. FINDINGS: VASCULAR Aorta: No aortic aneurysm or dissection. Similar fusiform ectasia of the infrarenal aorta measuring 2.7 cm. Extensive calcified atherosclerosis throughout the aorta. No hemodynamically significant stenosis. Celiac: Patent without acute thrombus, aneurysm, or dissection.No hemodynamically significant stenosis. SMA: Patent without acute thrombus, aneurysm, or dissection.Severe to critical stenosis of the proximal SMA from calcified plaque. Renals: Patent without acute thrombus or dissection.Moderate stenoses of the proximal renal arteries bilaterally due  to calcified plaque. 8 mm peripherally calcified aneurysm arising from the posterior aspect of the upstream right renal artery. IMA: Patent without acute thrombus, aneurysm, or dissection.No hemodynamically significant stenosis. Inflow: Patent without acute thrombus, aneurysm, or dissection.No hemodynamically significant stenosis. Proximal Outflow: The bilateral common femoral and visualized portions of the superficial and profunda femoral arteries are patent without acute thrombus,  aneurysm, or dissection.No hemodynamically significant stenosis. Veins: No obvious venous abnormality within the limitations of this arterial phase study. Review of the MIP images confirms the above findings. NON-VASCULAR Lower chest: No focal airspace consolidation or pleural effusion. Hepatobiliary: No mass.Cholecystectomy.No intrahepatic or extrahepatic biliary ductal dilation. Pancreas: No mass or main ductal dilation.No peripancreatic inflammation or fluid collection. Spleen: Normal size. No mass. Adrenals/Urinary Tract: No adrenal masses. Unchanged 2 cm left upper pole cyst. Persistent contrast opacification of the renal cortices on the noncontrast imaging. No hydronephrosis or nephrolithiasis. Excreted contrast filling the bladder lumen. 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 The stomach is decompressed without acute abnormality. No small bowel wall thickening or inflammation. No small bowel obstruction.Normal appendix. Total colonic diverticulosis. No changes of acute diverticulitis. GI Bleed: Contrast extravasation along the hepatic flexure of the colon (axial 22). Layering hyperdense material within multiple diverticula in the descending and sigmoid colon is present, possibly ingested material or layering contrast material from prior study. Lymphatic: No intraabdominal or pelvic lymphadenopathy. Reproductive: No prostatomegaly.No free pelvic fluid. Other: No pneumoperitoneum, ascites, or mesenteric inflammation. Musculoskeletal: No acute fracture or destructive lesion.Diffuse osteopenia. Multilevel degenerative disc disease of the spine. IMPRESSION: VASCULAR 1. Active GI bleeding along the hepatic flexure of the colon, possibly diverticular in nature. GI consultation recommended further management. 2. No aortic aneurysm or aortic dissection. NON-VASCULAR 1. 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 While no morphologic changes of cirrhosis are visualized, clinical and laboratory correlation requested. 2. Extensive total colonic diverticulosis. No changes of acute diverticulitis. 3. Persistent contrast opacification of the renal cortices on the noncontrast imaging, which can be seen in acute medical renal disease, possibly ATN. Critical Value/emergent results were called by telephone at the time of interpretation on 02/15/2024 at 11:00 am to provider [facility] , who verbally acknowledged these results. Aortic Atherosclerosis (ICD10-I70.0). Electronically Signed   By: Rogelia Myers M.D.   On: 02/15/2024 11:08      LOS: 2 days    Elgin Lam, MD [facility] 02/17/2024, 7:45 AM   If 7PM-7AM, please contact night-coverage [email or web address]

## 2024-02-18 ENCOUNTER — Inpatient Hospital Stay (HOSPITAL_COMMUNITY)

## 2024-02-18 ENCOUNTER — Other Ambulatory Visit (HOSPITAL_COMMUNITY): Payer: Self-pay

## 2024-02-18 ENCOUNTER — Encounter (HOSPITAL_COMMUNITY): Payer: Self-pay | Admitting: Internal Medicine

## 2024-02-18 DIAGNOSIS — K5730 Diverticulosis of large intestine without perforation or abscess without bleeding: Secondary | ICD-10-CM | POA: Diagnosis not present

## 2024-02-18 DIAGNOSIS — R58 Hemorrhage, not elsewhere classified: Secondary | ICD-10-CM | POA: Diagnosis not present

## 2024-02-18 DIAGNOSIS — K5521 Angiodysplasia of colon with hemorrhage: Secondary | ICD-10-CM | POA: Diagnosis not present

## 2024-02-18 LAB — BPAM RBC
Blood Product Expiration Date: 202601032359
Blood Product Expiration Date: 202601052359
Blood Product Expiration Date: 202601062359
Blood Product Expiration Date: 202601112359
Blood Product Expiration Date: 202601122359
Blood Product Expiration Date: 202601122359
ISSUE DATE / TIME: 202512160127
ISSUE DATE / TIME: 202512160236
ISSUE DATE / TIME: 202512160739
ISSUE DATE / TIME: 202512161426
ISSUE DATE / TIME: 202512161636
ISSUE DATE / TIME: 202512182106
Unit Type and Rh: 202601122359
Unit Type and Rh: 5100
Unit Type and Rh: 5100
Unit Type and Rh: 5100
Unit Type and Rh: 5100
Unit Type and Rh: 5100
Unit Type and Rh: 5100

## 2024-02-18 LAB — CBC
HCT: 23.3 % — ABNORMAL LOW (ref 39.0–52.0)
Hemoglobin: 8 g/dL — ABNORMAL LOW (ref 13.0–17.0)
MCH: 29.4 pg (ref 26.0–34.0)
MCHC: 34.3 g/dL (ref 30.0–36.0)
MCV: 85.7 fL (ref 80.0–100.0)
Platelets: 133 K/uL — ABNORMAL LOW (ref 150–400)
RBC: 2.72 MIL/uL — ABNORMAL LOW (ref 4.22–5.81)
RDW: 16.5 % — ABNORMAL HIGH (ref 11.5–15.5)
WBC: 12.1 K/uL — ABNORMAL HIGH (ref 4.0–10.5)
nRBC: 0.2 % (ref 0.0–0.2)

## 2024-02-18 LAB — TYPE AND SCREEN
ABO/RH(D): O POS
Antibody Screen: NEGATIVE
Unit division: 0
Unit division: 0
Unit division: 0
Unit division: 0
Unit division: 0
Unit division: 0

## 2024-02-18 LAB — BASIC METABOLIC PANEL WITH GFR
Anion gap: 9 (ref 5–15)
BUN: 39 mg/dL — ABNORMAL HIGH (ref 8–23)
CO2: 24 mmol/L (ref 22–32)
Calcium: 8 mg/dL — ABNORMAL LOW (ref 8.9–10.3)
Chloride: 101 mmol/L (ref 98–111)
Creatinine, Ser: 1.43 mg/dL — ABNORMAL HIGH (ref 0.61–1.24)
GFR, Estimated: 49 mL/min — ABNORMAL LOW
Glucose, Bld: 111 mg/dL — ABNORMAL HIGH (ref 70–99)
Potassium: 3.2 mmol/L — ABNORMAL LOW (ref 3.5–5.1)
Sodium: 134 mmol/L — ABNORMAL LOW (ref 135–145)

## 2024-02-18 LAB — MAGNESIUM: Magnesium: 2.2 mg/dL (ref 1.7–2.4)

## 2024-02-18 MED ORDER — POTASSIUM CHLORIDE CRYS ER 20 MEQ PO TBCR
40.0000 meq | EXTENDED_RELEASE_TABLET | Freq: Once | ORAL | Status: AC
  Administered 2024-02-18: 40 meq via ORAL
  Filled 2024-02-18: qty 2

## 2024-02-18 MED ORDER — FUROSEMIDE 10 MG/ML IJ SOLN
40.0000 mg | Freq: Every day | INTRAMUSCULAR | Status: DC
  Administered 2024-02-18: 40 mg via INTRAVENOUS
  Filled 2024-02-18: qty 4

## 2024-02-18 NOTE — Evaluation (Signed)
 Modified Barium Swallow Study  Patient Details  Name: BRIGHTEN ORNDOFF MRN: [id number] Date of Birth: 08-Aug-1940  Today's Date: 02/18/2024  Modified Barium Swallow completed.  Full report located under Chart Review in the Imaging Section.  History of Present Illness 83 yo male presenting to ED 12/15 for GIB. S/p EGD and colonoscopy 12/17. CXR 12/18 shows hazy and reticular opacities bilaterally, more pronounced in the lung bases, possibly reflecting alveolar and interstitial edema or pneumonitis. PMH includes CAD s/p CABG, chronic A-fib (not on anticoagulation d/t previous GIB), HTN, HLD, prior CVA (2017), COPD. BSE indicated need for MBS d/t overt s/s of aspiration during evaluation.   Clinical Impression Recommend Dysphagia 3/thin liquids with esophageal and swallowing precautions in place including multiple sips, repetititve swallows, liquid wash and throat clearing prn intermittently during meals. Medications may be beneficial with small sips of thin liquids and/or in puree/whole. ST will f/u for dysphagia tx/management in acute setting. Recommend ST f/u at next venue for dysphagia tx.     Pt presents with mild-moderate oropharyngeal dysphagia/mild-mod pharyngoesophageal dysphagia c/b decreased tongue control resulting in posterior escape of less than half of the bolus with thin and slow, prolonged mastication with complete recollection.  Delayed initiation of tongue motion with trace residue lining oral structures.  Swallow triggered at the level of the valleculae.  Partial anterior hyoid movement and decreased epiglottic inversion due to presence of cervical osteophytes with mild-moderate vallecular/posterior pharyngeal wall residue requiring multiple swallows, liquid wash and effortful swallow in conjunction to clear material. Pt did exhibit penetration and/or aspiration with residue with incomplete narrow column of air and contrast within the laryngeal vestibule.  Pharyngeal stripping wave  diminished.  Partial distension/duration and partial obstruction of flow with PES opening.  Tongue base decreased resulting in narrow column of contrast between PPW and tongue base.  SLight esophageal retention initially, but improved as study progressed. ST will f/u for education/diet tolerance during acute stay. Factors that may increase risk of adverse event in presence of aspiration Noe & Lianne 2021): Poor general health and/or compromised immunity;Reduced cognitive function;Frail or deconditioned;Aspiration of thick, dense, and/or acidic materials;Frequent aspiration of large volumes  Swallow Evaluation Recommendations Recommendations: PO diet PO Diet Recommendation: Dysphagia 3 (Mechanical soft);Thin liquids (Level 0) Liquid Administration via: Cup Medication Administration: Whole meds with puree (or liquids if small sips) Supervision: Staff to assist with self-feeding;Intermittent supervision/cueing for swallowing strategies Swallowing strategies  : Slow rate;Small bites/sips;effortful swallow;Multiple dry swallows after each bite/sip;Follow solids with liquids Postural changes: Position pt fully upright for meals;Stay upright 30-60 min after meals Oral care recommendations: Oral care BID (2x/day)      Pat Eleanore Junio,M.S.,CCC-SLP 02/18/2024,12:40 PM

## 2024-02-18 NOTE — Progress Notes (Signed)
 Physical Therapy Treatment Patient Details Name: William Elliott MRN: [id number] DOB: May 05, 1940 Today's Date: 02/18/2024   History of Present Illness 83 yo male presented 12/15 for acute GIB.PMH:  coronary artery disease, status post prior CABG 1999, and drug-eluting stent 2017.  NSTEMI July 2025.    PT Comments  Pt demonstrating good progress with mobility, increasing distance ambulated with AD and no physical assistance (see gait section). Pt fatigues quickly, but does not require increased assistance when ambulating back to room, nor does he report dizziness/lightheadedness this session. OT present for end of session, receiving pt at bedroom sink. Pt would benefit from further gait training. PT will continue to treat pt while he is admitted. Recommending HHPT at discharge to address remaining mobility needs and optimize return to PLOF.    If plan is discharge home, recommend the following: A little help with walking and/or transfers;A little help with bathing/dressing/bathroom;Assistance with cooking/housework;Assist for transportation;Help with stairs or ramp for entrance   Can travel by private vehicle        Equipment Recommendations  None recommended by PT    Recommendations for Other Services       Precautions / Restrictions Precautions Precautions: Fall Recall of Precautions/Restrictions: Intact Precaution/Restrictions Comments: watch orthostatics Restrictions Weight Bearing Restrictions Per Provider Order: No     Mobility  Bed Mobility Overal bed mobility: Needs Assistance Bed Mobility: Supine to Sit     Supine to sit: HOB elevated, Min assist     General bed mobility comments: Pt able to advance BLE off edge of bed and with VC to encourage trunk rotation for pt to reach across for bed railing, pt able to complete supine to sit with minimal physical asssitance for obtaining complete upright. Increased time to complete.    Transfers Overall transfer level: Needs  assistance Equipment used: Rolling walker (2 wheels) Transfers: Sit to/from Stand Sit to Stand: Contact guard assist           General transfer comment: Pt able to complete STS w/ RW and no physical assistance. Pt requires cueing for sequencing, UE placement and to increase anterior trunk lean for hip clearance. Increased time to complete.    Ambulation/Gait Ambulation/Gait assistance: Contact guard assist Gait Distance (Feet): 80 Feet Assistive device: Rolling walker (2 wheels) Gait Pattern/deviations: Step-through pattern, Decreased stride length, Shuffle, Trunk flexed Gait velocity: reduced Gait velocity interpretation: <1.8 ft/sec, indicate of risk for recurrent falls   General Gait Details: Pt ambulating outside of base of walker and requires frequent cueing to maintain within its width for optimal stability. Pt demonstrates reduced step  and stride length, flexed trunk, and shuffle like gait with a tendency to lean slightly to the R.   Stairs             Wheelchair Mobility     Tilt Bed    Modified Rankin (Stroke Patients Only)       Balance Overall balance assessment: Needs assistance Sitting-balance support: No upper extremity supported, Feet supported Sitting balance-Leahy Scale: Good Sitting balance - Comments: seated EOB   Standing balance support: Bilateral upper extremity supported, Reliant on assistive device for balance, During functional activity Standing balance-Leahy Scale: Poor Standing balance comment: reliant on external support                            Communication Communication Communication: Impaired Factors Affecting Communication: Hearing impaired  Cognition Arousal: Alert Behavior During Therapy: WFL for tasks assessed/performed  PT - Cognitive impairments: Memory, Attention, Sequencing, Problem solving, Initiation                         Following commands: Intact      Cueing Cueing Techniques:  Verbal cues  Exercises      General Comments General comments (skin integrity, edema, etc.): Supine BP: 113/51, seated BP: 94/45; no reports of dizziness throughout      Pertinent Vitals/Pain Pain Assessment Pain Assessment: No/denies pain    Home Living                          Prior Function            PT Goals (current goals can now be found in the care plan section) Acute Rehab PT Goals Patient Stated Goal: Go home PT Goal Formulation: With patient/family Time For Goal Achievement: 03/02/24 Potential to Achieve Goals: Good Progress towards PT goals: Progressing toward goals    Frequency    Min 2X/week      PT Plan      Co-evaluation              AM-PAC PT 6 Clicks Mobility   Outcome Measure  Help needed turning from your back to your side while in a flat bed without using bedrails?: A Little Help needed moving from lying on your back to sitting on the side of a flat bed without using bedrails?: A Little Help needed moving to and from a bed to a chair (including a wheelchair)?: A Little Help needed standing up from a chair using your arms (e.g., wheelchair or bedside chair)?: A Little Help needed to walk in hospital room?: A Little Help needed climbing 3-5 steps with a railing? : Total 6 Click Score: 16    End of Session Equipment Utilized During Treatment: Gait belt Activity Tolerance: Patient tolerated treatment well Patient left: Other (comment) (OT received pt upon entering back into room) Nurse Communication: Mobility status PT Visit Diagnosis: Unsteadiness on feet (R26.81);Other abnormalities of gait and mobility (R26.89);Muscle weakness (generalized) (M62.81);History of falling (Z91.81);Difficulty in walking, not elsewhere classified (R26.2)     Time: 8864-8843 PT Time Calculation (min) (ACUTE ONLY): 21 min  Charges:    $Therapeutic Activity: 8-22 mins PT General Charges $$ ACUTE PT VISIT: 1 Visit                      Leontine Hilt DPT Acute Rehab Services [phone] Prefer contact via chat    Leontine NOVAK Teri Diltz 02/18/2024, 3:23 PM

## 2024-02-18 NOTE — Plan of Care (Signed)

## 2024-02-18 NOTE — Progress Notes (Signed)
 Occupational Therapy Treatment Patient Details Name: William Elliott MRN: [id number] DOB: 1940-04-07 Today's Date: 02/18/2024   History of present illness 83 yo male presented 12/15 for acute GIB.PMH:  coronary artery disease, status post prior CABG 1999, and drug-eluting stent 2017.  NSTEMI July 2025.   OT comments  Pt making progress with functional goals. Pt limited by decreased activity tolerance, fatiguing quickly during tasks. Session focused on bathing and dressing at sink, toilet transfers, toileting tasks, grooming/hygiene at sink. Pt very pleasant and cooperative. OT will continue to follow acutely to maximize level of function and safety      If plan is discharge home, recommend the following:  A little help with walking and/or transfers;A lot of help with bathing/dressing/bathroom;Assistance with cooking/housework;Direct supervision/assist for medications management;Direct supervision/assist for financial management;Assist for transportation;Help with stairs or ramp for entrance   Equipment Recommendations  BSC/3in1    Recommendations for Other Services      Precautions / Restrictions Precautions Precautions: Fall Recall of Precautions/Restrictions: Intact Precaution/Restrictions Comments: watch orthostatics Restrictions Weight Bearing Restrictions Per Provider Order: No       Mobility Bed Mobility               General bed mobility comments: Pt up walking with staff upon OT arrival    Transfers Overall transfer level: Needs assistance Equipment used: Rolling walker (2 wheels) Transfers: Sit to/from Stand, Bed to chair/wheelchair/BSC Sit to Stand: Min assist                 Balance Overall balance assessment: Needs assistance Sitting-balance support: No upper extremity supported, Feet supported Sitting balance-Leahy Scale: Good     Standing balance support: Bilateral upper extremity supported, Reliant on assistive device for balance, During  functional activity Standing balance-Leahy Scale: Poor                             ADL either performed or assessed with clinical judgement   ADL Overall ADL's : Needs assistance/impaired     Grooming: Wash/dry hands;Wash/dry face;Oral care;Contact guard assist;Standing   Upper Body Bathing: Set up;Supervision/ safety;Sitting   Lower Body Bathing: Moderate assistance;Sit to/from stand   Upper Body Dressing : Set up;Supervision/safety;Sitting       Toilet Transfer: Contact guard assist;Minimal assistance;Rolling walker (2 wheels);BSC/3in1;Cueing for safety   Toileting- Clothing Manipulation and Hygiene: Moderate assistance;Sit to/from stand       Functional mobility during ADLs: Minimal assistance;Rolling walker (2 wheels);Cueing for safety General ADL Comments: Pt with decreased activity tolerance, fatiguing quickly during tasks. Participated in bathing and dressing at sink    Extremity/Trunk Assessment Upper Extremity Assessment Upper Extremity Assessment: Generalized weakness   Lower Extremity Assessment Lower Extremity Assessment: Defer to PT evaluation        Vision Ability to See in Adequate Light: 0 Adequate Patient Visual Report: No change from baseline     Perception     Praxis     Communication Communication Communication: Impaired Factors Affecting Communication: Hearing impaired   Cognition Arousal: Alert Behavior During Therapy: [facility] for tasks assessed/performed   Difficult to assess due to: Hard of hearing/deaf                             Following commands: Intact        Cueing   Cueing Techniques: Verbal cues, Gestural cues  Exercises      Shoulder Instructions  General Comments      Pertinent Vitals/ Pain       Pain Assessment Pain Assessment: No/denies pain  Home Living                                          Prior Functioning/Environment              Frequency  Min  2X/week        Progress Toward Goals  OT Goals(current goals can now be found in the care plan section)  Progress towards OT goals: Progressing toward goals     Plan      Co-evaluation                 AM-PAC OT 6 Clicks Daily Activity     Outcome Measure   Help from another person eating meals?: None Help from another person taking care of personal grooming?: A Little Help from another person toileting, which includes using toliet, bedpan, or urinal?: A Lot Help from another person bathing (including washing, rinsing, drying)?: A Little Help from another person to put on and taking off regular upper body clothing?: A Little Help from another person to put on and taking off regular lower body clothing?: A Lot 6 Click Score: 17    End of Session Equipment Utilized During Treatment: Rolling walker (2 wheels);Gait belt  OT Visit Diagnosis: Unsteadiness on feet (R26.81);Other abnormalities of gait and mobility (R26.89);Muscle weakness (generalized) (M62.81);History of falling (Z91.81);Other symptoms and signs involving cognitive function   Activity Tolerance Patient tolerated treatment well;Patient limited by fatigue   Patient Left in bed;with call bell/phone within reach;with bed alarm set   Nurse Communication Mobility status        Time: 8853-8772 OT Time Calculation (min): 41 min  Charges: OT General Charges $OT Visit: 1 Visit OT Treatments $Self Care/Home Management : 23-37 mins $Therapeutic Activity: 8-22 mins    Jacques Karna Loose 02/18/2024, 2:07 PM

## 2024-02-18 NOTE — Progress Notes (Signed)
 [facility] night cross cover note:   Regarding this patient was hospitalized with acute lower gastrointestinal bleed, patient's RN notified me that the patient has now finished transfusion of 1 unit PRBC.  Postop blood pressures in the low 100s mmHg. patient is reporting some shortness of breath following this PRBC transfusion.  Most recent vital signs notable for afebrile; heart rates in the 70s; respiratory rate in the low 20s, consistent with respiratory rate during dayshift, with oxygen saturation 97% on room air.  He is due for a dose of 20 mg of IV Lasix  posttransfusion.  Will provide this dose of IV Lasix  at this time.  With his report of shortness of breath, will also obtain updated chest x-ray at this time.  No report of any associated shortness of breath.  Will also check posttransfusion H&H.    Eva Pore, DO Hospitalist

## 2024-02-18 NOTE — Progress Notes (Signed)
 "  PROGRESS NOTE    [name]  FMW:[id number] DOB: [birth year] DOA: 02/14/2024 PCP: [name], MD   Brief Narrative: William Elliott is a 83 y.o. male with a history of CAD status post DES and CABG, chronic atrial fibrillation, hypertension, hyperlipidemia, heart failure with reduced EF, GI bleeding, CVA.  Patient presented secondary to bright red blood per rectum consistent with GI bleeding.  Patient with associated hemorrhagic shock requiring ICU admission.  Gastroenterology consulted and performed an upper GI endoscopy in addition to a colonoscopy, with the colonoscopy revealing 2 angiectasia's which were treated with APC in addition to severe diverticulosis throughout the colon.  Patient has required a total of 4 units of PRBC, 1 unit of FFP, and 1 unit of platelets.  Hospitalization further complicated by development of pneumonia in addition to acute heart failure.   Assessment and Plan:  GI bleeding Hematochezia Patient with history of diverticulosis.  Patient with significant GI bleeding leading to hemorrhagic shock.  Gastroenterology was consulted and performed an upper GI endoscopy in addition to colonoscopy.  Upper GI endoscopy was unremarkable and colonoscopy was remarkable for two colonic angiectasias around the hepatic flexure treated with APC in addition to severe diverticulosis in the sigmoid colon, descending colon, hepatic flexure, ascending colon and cecum. No recurrent GI bleeding.  Hemorrhagic shock Acute blood loss anemia Secondary to significant GI bleeding. Patient required ICU admission. He was managed with IV fluids and several blood transfusions; no vasopressor needs. Shock resolved with resuscitation. Patient required a total of 4 units of PRBC, 1 unit of FFP and 1 unit of platelets. Hemoglobin drifted down to 7.5 requiring an addition 1 unit of PRBC with posttransfusion hemoglobin up to 8.0. - GI recommendations: recommendation to keep hemoglobin  >8  CAD S/p CABG. No chest pain.  Acute on chronic HFrEF Prior to arrival medication(s) include Jardiance , furosemide /potassium chloride , Imdur , losartan , spironolactone .  Last echo from July 2025 significant for an LVEF of 35 to 40% in addition to mildly reduced and moderately enlarged right ventricle.  Patient has received multiple IV fluid boluses and transfusions this admission for hemorrhagic shock.  Chest x-ray from 12/18 concerning for possible interstitial edema. - Continue Lasix  IV - Daily BMP while on Lasix  IV - Strict ins and outs and daily weights  Possible community acquired pneumonia Patient with congestion in addition to mild patchy bilateral lower lobe opacities concerning for possible pneumonia.  Patient started on empiric ceftriaxone  while in the ICU. Concern with patient coughing during oral intake of fluids. - Continue ceftriaxone  IV - SLP evaluation  Primary hypertension Prior to arrival medication(s) include amlodipine , losartan , Imdur  and spironolactone .  Chronic atrial fibrillation Patient is not on rate control or anticoagulation as an outpatient.  Currently rate controlled.  Leukocytosis Patient appears to have chronic leukocytosis. Possibly stress related, although patient does have concern for possible pneumonia. Peak WBC of 36,400, which may have been an aberrant result. WBC improved to 12,100 today.  AKI Baseline creatinine appears to be around 1.1 to 1.3. Creatinine of 1.47 and has worsened to 2.13. Complicated by hypotension in addition to multiple doses of IV contrast for CTA imaging. Patient also has evidence of fluid overload. Creatinine improved to 1.43.  Hypokalemia Mild. Potassium of 3.2 today. - Potassium supplementation  Ventricular trigeminy Noted on telemetry. Asymptomatic. Patient with reduced LVEF. Patient is not on beta-blocker therapy secondary to history of bradycardia. - Check EKG   DVT prophylaxis: SCDs Code Status:   Code Status:  Full Code Family Communication: Son at bedside Disposition Plan: Discharge pending stable hemoglobin in addition to improvement of fluid status   Consultants:  PCCM Gastroenterology  Procedures:  Upper GI endoscopy Colonoscopy  Antimicrobials: Ceftriaxone    Subjective: Recurrent episode of dyspnea overnight. Symptoms worse with lying down flat. No other issues overnight.  Objective: BP (!) 114/50 (BP Location: Right Arm)   Pulse 79   Temp 98.6 F (37 C) (Oral)   Resp 20   Ht 5' 10 (1.778 m)   Wt 73.2 kg   SpO2 97%   BMI 23.16 kg/m   Examination:  General exam: Appears calm and comfortable. Respiratory system: Clear to auscultation. Respiratory effort normal. Cardiovascular system: S1 & S2 heard. Gastrointestinal system: Abdomen is nondistended, soft and nontender. Normal bowel sounds heard. Central nervous system: Alert and oriented. No focal neurological deficits. Musculoskeletal: Bilateral lower extremity edema up to thighs. No calf tenderness Psychiatry: Judgement and insight appear normal. Mood & affect appropriate.    Data Reviewed: I have personally reviewed following labs and imaging studies  CBC Lab Results  Component Value Date   WBC 12.1 (H) 02/18/2024   RBC 2.72 (L) 02/18/2024   HGB 8.0 (L) 02/18/2024   HCT 23.3 (L) 02/18/2024   MCV 85.7 02/18/2024   MCH 29.4 02/18/2024   PLT 133 (L) 02/18/2024   MCHC 34.3 02/18/2024   RDW 16.5 (H) 02/18/2024   LYMPHSABS 0.8 02/17/2024   MONOABS 1.1 (H) 02/17/2024   EOSABS 0.0 02/17/2024   BASOSABS 0.0 02/17/2024     Last metabolic panel Lab Results  Component Value Date   NA 134 (L) 02/18/2024   K 3.2 (L) 02/18/2024   CL 101 02/18/2024   CO2 24 02/18/2024   BUN 39 (H) 02/18/2024   CREATININE 1.43 (H) 02/18/2024   GLUCOSE 111 (H) 02/18/2024   GFRNONAA 49 (L) 02/18/2024   GFRAA >60 12/07/2015   CALCIUM  8.0 (L) 02/18/2024   PHOS 2.9 01/21/2023   PROT 5.3 (L) 02/14/2024   ALBUMIN 2.8 (L)  02/14/2024   BILITOT 1.3 (H) 02/14/2024   ALKPHOS 56 02/14/2024   AST 35 02/14/2024   ALT 15 02/14/2024   ANIONGAP 9 02/18/2024    GFR: Estimated Creatinine Clearance: 41.1 mL/min (A) (by C-G formula based on SCr of 1.43 mg/dL (H)).  Recent Results (from the past 240 hours)  Blood Culture (routine x 2)     Status: None (Preliminary result)   Collection Time: 02/15/24  1:55 AM   Specimen: BLOOD  Result Value Ref Range Status   Specimen Description BLOOD RIGHT ANTECUBITAL  Final   Special Requests   Final    BOTTLES DRAWN AEROBIC AND ANAEROBIC Blood Culture adequate volume   Culture   Final    NO GROWTH 3 DAYS Performed at [facility], [address], [place], [place] [address]    Report Status PENDING  Incomplete  Blood Culture (routine x 2)     Status: None (Preliminary result)   Collection Time: 02/15/24  2:00 AM   Specimen: BLOOD  Result Value Ref Range Status   Specimen Description BLOOD LEFT ANTECUBITAL  Final   Special Requests   Final    BOTTLES DRAWN AEROBIC AND ANAEROBIC Blood Culture adequate volume   Culture   Final    NO GROWTH 3 DAYS Performed at [facility], [address] [place], [place] [address]    Report Status PENDING  Incomplete  MRSA Next Gen by PCR, Nasal  Status: None   Collection Time: 02/15/24  4:22 AM   Specimen: Nasal Mucosa; Nasal Swab  Result Value Ref Range Status   MRSA by PCR Next Gen NOT DETECTED NOT DETECTED Final    Comment: (NOTE) The GeneXpert MRSA Assay (FDA approved for NASAL specimens only), is one component of a comprehensive MRSA colonization surveillance program. It is not intended to diagnose MRSA infection nor to guide or monitor treatment for MRSA infections. Test performance is not FDA approved in patients less than 77 years old. Performed at [facility], [address], [place], [place] [address]       Radiology Studies: DG Chest Port 1 View Result Date: 02/18/2024 EXAM: 1 VIEW(S) XRAY OF  THE CHEST 02/18/2024 12:46:00 AM COMPARISON: 02/17/2024 CLINICAL HISTORY: SOB (shortness of breath) [id number] FINDINGS: LUNGS AND PLEURA: Stable increased central vascularity is noted with very mild interstitial edema. No focal confluent infiltrate is seen. No sizable effusion is noted. No pneumothorax. Postsurgical changes are again noted. HEART AND MEDIASTINUM: Stable cardiomegaly. No acute abnormality of the mediastinal silhouette. BONES AND SOFT TISSUES: No acute osseous abnormality. IMPRESSION: 1. Stable increased central vascularity with very mild interstitial edema. No focal confluent infiltrate or sizable effusion. 2. Stable cardiomegaly. Electronically signed by: Oneil Devonshire MD 02/18/2024 12:53 AM EST RP Workstation: [id number]   DG Chest Port 1 View Result Date: 02/17/2024 EXAM: 1 VIEW(S) XRAY OF THE CHEST 02/17/2024 06:25:02 AM COMPARISON: 02/15/2024 CLINICAL HISTORY: Coarse respiratory crackles FINDINGS: LUNGS AND PLEURA: There are hazy and reticular opacities present bilaterally, more pronounced in the lung bases, which could reflect alveolar and interstitial edema or pneumonitis. No pleural effusion. No pneumothorax. HEART AND MEDIASTINUM: Mild cardiomegaly. Aortic arch atherosclerosis. CABG markers noted. BONES AND SOFT TISSUES: Median sternotomy wires and CABG markers noted. IMPRESSION: 1. Hazy and reticular opacities bilaterally, more pronounced in the lung bases, possibly reflecting alveolar and interstitial edema or pneumonitis. 2. Mild cardiomegaly and aortic arch atherosclerosis. Electronically signed by: Evalene Coho MD 02/17/2024 06:45 AM EST RP Workstation: [id number]      LOS: 3 days    Elgin Lam, MD [facility] 02/18/2024, 11:40 AM   If 7PM-7AM, please contact night-coverage [email or web address]  "

## 2024-02-19 DIAGNOSIS — R58 Hemorrhage, not elsewhere classified: Secondary | ICD-10-CM | POA: Diagnosis not present

## 2024-02-19 LAB — CBC
HCT: 23.4 % — ABNORMAL LOW (ref 39.0–52.0)
Hemoglobin: 7.9 g/dL — ABNORMAL LOW (ref 13.0–17.0)
MCH: 29.5 pg (ref 26.0–34.0)
MCHC: 33.8 g/dL (ref 30.0–36.0)
MCV: 87.3 fL (ref 80.0–100.0)
Platelets: 145 K/uL — ABNORMAL LOW (ref 150–400)
RBC: 2.68 MIL/uL — ABNORMAL LOW (ref 4.22–5.81)
RDW: 17.2 % — ABNORMAL HIGH (ref 11.5–15.5)
WBC: 11.2 K/uL — ABNORMAL HIGH (ref 4.0–10.5)
nRBC: 0 % (ref 0.0–0.2)

## 2024-02-19 LAB — BASIC METABOLIC PANEL WITH GFR
Anion gap: 8 (ref 5–15)
BUN: 33 mg/dL — ABNORMAL HIGH (ref 8–23)
CO2: 25 mmol/L (ref 22–32)
Calcium: 8.1 mg/dL — ABNORMAL LOW (ref 8.9–10.3)
Chloride: 103 mmol/L (ref 98–111)
Creatinine, Ser: 1.03 mg/dL (ref 0.61–1.24)
GFR, Estimated: >60 mL/min
Glucose, Bld: 109 mg/dL — ABNORMAL HIGH (ref 70–99)
Potassium: 3.4 mmol/L — ABNORMAL LOW (ref 3.5–5.1)
Sodium: 136 mmol/L (ref 135–145)

## 2024-02-19 MED ORDER — POTASSIUM CHLORIDE CRYS ER 20 MEQ PO TBCR
40.0000 meq | EXTENDED_RELEASE_TABLET | Freq: Once | ORAL | Status: AC
  Administered 2024-02-19: 40 meq via ORAL
  Filled 2024-02-19: qty 2

## 2024-02-19 NOTE — Discharge Instructions (Signed)
 William Elliott,  You were in the hospital with GI bleeding causing really low blood pressure. You required multiple blood transfusions and fluids. The GI doctor performed endoscopy procedures, identifying diverticulosis, in addition to blood vessels, both of which can be the cause of your bleeding. Your blood pressure medication is being held on discharge, because your blood pressure is on the lower side, although still stable. Please discuss resumption with your PCP. The GI doctor has recommended that you restart your aspirin  at this time.

## 2024-02-19 NOTE — Discharge Summary (Incomplete)
 " Physician Discharge Summary   Patient: William Elliott MRN: [id number] DOB: 1940-08-08  Admit date:     02/14/2024  Discharge date: {dischdate:26783}  Discharge Physician: Elgin Lam   PCP: [name], MD   Recommendations at discharge:  {Tip this will not be part of the note when signed- Example include specific recommendations for outpatient follow-up, pending tests to follow-up on. (Optional):26781}  ***  Discharge Diagnoses: Principal Problem:   Acute bleeding Active Problems:   Abnormal CT scan, stomach   Angiodysplasia of colon with hemorrhage   Diverticulosis of colon  Resolved Problems:   * No resolved hospital problems. *  Hospital Course: William Elliott is a 83 y.o. male with a history of CAD status post DES and CABG, chronic atrial fibrillation, hypertension, hyperlipidemia, heart failure with reduced EF, GI bleeding, CVA.  Patient presented secondary to bright red blood per rectum consistent with GI bleeding.  Patient with associated hemorrhagic shock requiring ICU admission.  Gastroenterology consulted and performed an upper GI endoscopy in addition to a colonoscopy, with the colonoscopy revealing 2 angiectasia's which were treated with APC in addition to severe diverticulosis throughout the colon.  Patient has required a total of 4 units of PRBC, 1 unit of FFP, and 1 unit of platelets.  Hospitalization further complicated by development of pneumonia in addition to acute heart failure.  Assessment and Plan: No notes have been filed under this hospital service. Service: Hospitalist     {Tip this will not be part of the note when signed Body mass index is 22.65 kg/m. , ,  (Optional):26781}  {(NOTE) Pain control PDMP Statment (Optional):26782} Consultants: *** Procedures performed: ***  Disposition: {Plan; Disposition:26390} Diet recommendation:  {Diet_Plan:26776} DISCHARGE MEDICATION: Allergies as of 02/19/2024       Reactions   Anoro Ellipta   [umeclidinium-vilanterol] Other (See Comments)   Cardiac problems   Sulfites Shortness Of Breath, Anxiety   Lipitor [atorvastatin Calcium ] Other (See Comments)   Unknown reaction   Niaspan [niacin] Other (See Comments)   Unknown reaction   Tricor [fenofibrate] Other (See Comments)   Fatigue    Zocor [simvastatin] Other (See Comments)   Unknown reaction        Medication List     PAUSE taking these medications    amLODipine  5 MG tablet Wait to take this until your doctor or other care provider tells you to start again. Commonly known as: NORVASC  Take 5 mg by mouth daily.   isosorbide  mononitrate 30 MG 24 hr tablet Wait to take this until your doctor or other care provider tells you to start again. Commonly known as: IMDUR  Take 0.5 tablets (15 mg total) by mouth at bedtime.   losartan  25 MG tablet Wait to take this until your doctor or other care provider tells you to start again. Commonly known as: COZAAR  Take 0.5 tablets (12.5 mg total) by mouth daily.   spironolactone  25 MG tablet Wait to take this until your doctor or other care provider tells you to start again. Commonly known as: ALDACTONE  Take 1 tablet (25 mg total) by mouth daily.       TAKE these medications    acetaminophen  500 MG tablet Commonly known as: TYLENOL  Take 1,000 mg by mouth 2 (two) times daily as needed for moderate pain (pain score 4-6), fever or headache.   albuterol  108 (90 Base) MCG/ACT inhaler Commonly known as: VENTOLIN  HFA Inhale 2 puffs into the lungs every 6 (six) hours as needed for wheezing or shortness  of breath.   ALPRAZolam  1 MG tablet Commonly known as: XANAX  Take 1 tablet (1 mg total) by mouth at bedtime as needed for anxiety.   aspirin  EC 81 MG tablet Take 1 tablet (81 mg total) by mouth daily.   CHELATED ZINC  PO Take 1 tablet by mouth daily with breakfast.   empagliflozin  10 MG Tabs tablet Commonly known as: JARDIANCE  Take 1 tablet (10 mg total) by mouth daily.    FISH OIL PO Take 1 capsule by mouth daily with breakfast.   furosemide  20 MG tablet Commonly known as: LASIX  Take 4 tablets (80 mg total) by mouth daily. What changed: how much to take   ipratropium-albuterol  0.5-2.5 (3) MG/3ML Soln Commonly known as: DUONEB Take 3 mLs by nebulization every 4 (four) hours as needed (wheezing, shortness of breath).   IRON PO Take 1 tablet by mouth daily with breakfast.   Mens 50+ Multivitamin Tabs Take 1 tablet by mouth daily with breakfast.   mexiletine 200 MG capsule Commonly known as: MEXITIL  TAKE ONE CAPSULE BY MOUTH TWICE DAILY   Nexlizet 180-10 MG Tabs Generic drug: Bempedoic Acid -Ezetimibe  Take 1 tablet by mouth daily.   nitroGLYCERIN  0.4 MG SL tablet Commonly known as: NITROSTAT  DISSOLVE 1 TABLET UNDER THE TONGUE EVERY 5 MINUTES AS NEEDED FOR CHEST PAIN. DO NOT EXCEED 3 DOSES IN 15 MIN. CALL 911 IF NO RELIEF   nystatin powder Commonly known as: MYCOSTATIN/NYSTOP Apply 1 Application topically daily as needed (for dryness).   pantoprazole  40 MG tablet Commonly known as: PROTONIX  TAKE 1 TABLET BY MOUTH EVERY DAY   potassium chloride  SA 20 MEQ tablet Commonly known as: KLOR-CON  M Take 20 mEq by mouth daily. Take with Lasix    Stiolto Respimat  2.5-2.5 MCG/ACT Aers Generic drug: Tiotropium Bromide -Olodaterol Inhale 2 puffs into the lungs daily.   SUPER B COMPLEX PO Take 1 capsule by mouth daily with breakfast.   VITAMIN C  PO Take 1 tablet by mouth daily with breakfast.        Contact information for follow-up providers     Care, [facility] Follow up.   Specialty: Home Health Services Why: home health has been arranged. They will contact you to schedule apt Contact information: [address] [place] [address] [phone]         [name], MD. Schedule an appointment as soon as possible for a visit in 1 week(s).   Specialty: Internal Medicine Why: For hospital follow-up Contact  information: [address] [place] [facility] [address] [phone]         Pyrtle, Gordy HERO, MD. Schedule an appointment as soon as possible for a visit in 4 week(s).   Specialty: Gastroenterology Why: For hospital follow-up Contact information: [address] [place] [address] [phone]              Contact information for after-discharge care     Home Medical Care     [facility] [place] [facility]) .   Service: Home Health Services Contact information: [address] [place]  [address] [phone]                    Discharge Exam: [name]   02/17/24 0600 02/18/24 0437 02/19/24 0308  Weight: 69.6 kg 73.2 kg 71.6 kg   ***  Condition at discharge: {DC Condition:26389}  The results of significant diagnostics from this hospitalization (including imaging, microbiology, ancillary and laboratory) are listed below  for reference.   Imaging Studies: DG Swallowing Func-Speech Pathology Result Date: 02/18/2024 Table formatting from the original result was not included. Modified Barium Swallow Study Patient Details Name: GARLAND HINCAPIE MRN: [id number] Date of Birth: Jul 18, 1940 Today's Date: 02/18/2024 HPI/PMH: HPI: 83 yo male presenting to ED 12/15 for GIB. S/p EGD and colonoscopy 12/17. CXR 12/18 shows hazy and reticular opacities bilaterally, more pronounced in the lung bases, possibly reflecting alveolar and interstitial edema or pneumonitis. PMH includes CAD s/p CABG, chronic A-fib (not on anticoagulation d/t previous GIB), HTN, HLD, prior CVA (2017), COPD. BSE indicated need for MBS d/t overt s/s of aspiration during evaluation. Clinical Impression: Clinical Impression: Recommend Dysphagia 3/thin liquids with esophageal and swallowing precautions in place including multiple sips, repetititve swallows, liquid wash and throat clearing prn intermittently during meals. Medications may be beneficial with small sips  of thin liquids and/or in puree/whole. ST will f/u for dysphagia tx/management in acute setting. Recommend ST f/u at next venue for dysphagia tx.   Pt presents with mild-moderate oropharyngeal dysphagia/mild-mod pharyngoesophageal dysphagia c/b decreased tongue control resulting in posterior escape of less than half of the bolus with thin and slow, prolonged mastication with complete recollection.  Delayed initiation of tongue motion with trace residue lining oral structures.  Swallow triggered at the level of the valleculae.  Partial anterior hyoid movement and decreased epiglottic inversion due to presence of cervical osteophytes with mild-moderate vallecular/posterior pharyngeal wall residue requiring multiple swallows, liquid wash and effortful swallow in conjunction to clear material. Pt did exhibit penetration and/or aspiration with residue with incomplete narrow column of air and contrast within the laryngeal vestibule.  Pharyngeal stripping wave diminished.  Partial distension/duration and partial obstruction of flow with PES opening.  Tongue base decreased resulting in narrow column of contrast between PPW and tongue base.  SLight esophageal retention initially, but improved as study progressed. ST will f/u in acute setting for dysphagia tx/education. DIGEST Swallow Severity Rating*  Safety: 2  Efficiency:2  Overall Pharyngeal Swallow Severity: moderate 1: mild; 2: moderate; 3: severe; 4: profound *The Dynamic Imaging Grade of Swallowing Toxicity is standardized for the head and neck cancer population, however, demonstrates promising clinical applications across populations to standardize the clinical rating of pharyngeal swallow safety and severity. Factors that may increase risk of adverse event in presence of aspiration Noe & Lianne 2021): Factors that may increase risk of adverse event in presence of aspiration Noe & Lianne 2021): Poor general health and/or compromised immunity; Reduced  cognitive function; Frail or deconditioned; Aspiration of thick, dense, and/or acidic materials; Frequent aspiration of large volumes Recommendations/Plan: Swallowing Evaluation Recommendations Swallowing Evaluation Recommendations Recommendations: PO diet PO Diet Recommendation: Dysphagia 3 (Mechanical soft); Thin liquids (Level 0) Liquid Administration via: Cup Medication Administration: Whole meds with puree (or liquids if small sips) Supervision: Staff to assist with self-feeding; Intermittent supervision/cueing for swallowing strategies Swallowing strategies  : Slow rate; Small bites/sips; effortful swallow; Multiple dry swallows after each bite/sip; Follow solids with liquids Postural changes: Position pt fully upright for meals; Stay upright 30-60 min after meals Oral care recommendations: Oral care BID (2x/day) Treatment Plan Treatment Plan Treatment recommendations: Therapy as outlined in treatment plan below Follow-up recommendations: Other (comment) (SLP f/u at next venue of care) Functional status assessment: Patient has had a recent decline in their functional status and demonstrates the ability to make significant improvements in function in a reasonable and predictable amount of time. Treatment frequency: Min 2x/week Treatment duration: 1 week Interventions: Aspiration precaution training; Patient/family  education; Trials of upgraded texture/liquids; Diet toleration management by SLP Recommendations Recommendations for follow up therapy are one component of a multi-disciplinary discharge planning process, led by the attending physician.  Recommendations may be updated based on patient status, additional functional criteria and insurance authorization. Assessment: Orofacial Exam: Orofacial Exam Oral Cavity: Oral Hygiene: WFL Oral Cavity - Dentition: Adequate natural dentition Orofacial Anatomy: WFL Oral Motor/Sensory Function: WFL Anatomy: Anatomy: Suspected cervical osteophytes Boluses Administered:  Boluses Administered Boluses Administered: Thin liquids (Level 0); Mildly thick liquids (Level 2, nectar thick); Moderately thick liquids (Level 3, honey thick); Puree; Solid  Oral Impairment Domain: Oral Impairment Domain Lip Closure: No labial escape Tongue control during bolus hold: Posterior escape of less than half of bolus Bolus preparation/mastication: Slow prolonged chewing/mashing with complete recollection Bolus transport/lingual motion: Delayed initiation of tongue motion (oral holding) Oral residue: Trace residue lining oral structures Location of oral residue : Tongue Initiation of pharyngeal swallow : Valleculae  Pharyngeal Impairment Domain: Pharyngeal Impairment Domain Soft palate elevation: No bolus between soft palate (SP)/pharyngeal wall (PW) Laryngeal elevation: Complete superior movement of thyroid  cartilage with complete approximation of arytenoids to epiglottic petiole Anterior hyoid excursion: Partial anterior movement Epiglottic movement: Partial inversion Laryngeal vestibule closure: Incomplete, narrow column air/contrast in laryngeal vestibule Pharyngeal stripping wave : Present - diminished Pharyngeal contraction (A/P view only): N/A Pharyngoesophageal segment opening: Partial distention/partial duration, partial obstruction of flow Tongue base retraction: Narrow column of contrast or air between tongue base and PPW Pharyngeal residue: Collection of residue within or on pharyngeal structures Location of pharyngeal residue: Valleculae; Pharyngeal wall  Esophageal Impairment Domain: Esophageal Impairment Domain Esophageal clearance upright position: Esophageal retention Pill: Pill Consistency administered: Thin liquids (Level 0) Thin liquids (Level 0): Impaired (see clinical impressions) Penetration/Aspiration Scale Score: Penetration/Aspiration Scale Score 1.  Material does not enter airway: Puree; Solid 2.  Material enters airway, remains ABOVE vocal cords then ejected out: Thin liquids  (Level 0); Mildly thick liquids (Level 2, nectar thick) 3.  Material enters airway, remains ABOVE vocal cords and not ejected out: Thin liquids (Level 0) 4.  Material enters airway, CONTACTS cords then ejected out: Thin liquids (Level 0); Mildly thick liquids (Level 2, nectar thick) 5.  Material enters airway, CONTACTS cords and not ejected out: Thin liquids (Level 0); Moderately thick liquids (Level 3, honey thick) Compensatory Strategies: Compensatory Strategies Compensatory strategies: Yes Effortful swallow: Effective Effective Effortful Swallow: Thin liquid (Level 0); Puree Multiple swallows: Effective Effective Multiple Swallows: Thin liquid (Level 0); Puree; Solid Liquid wash: Effective Effective Liquid Wash: Thin liquid (Level 0); Puree; Solid   General Information: Caregiver present: No  Diet Prior to this Study: Regular; Thin liquids (Level 0)   Temperature : Normal   Respiratory Status: WFL   Supplemental O2: None (Room air)   History of Recent Intubation: No  Behavior/Cognition: Alert; Cooperative Self-Feeding Abilities: Able to self-feed Baseline vocal quality/speech: Dysphonic Volitional Cough: Able to elicit Volitional Swallow: Able to elicit Exam Limitations: No limitations Goal Planning: Prognosis for improved oropharyngeal function: Good No data recorded No data recorded Patient/Family Stated Goal: none stated Consulted and agree with results and recommendations: Patient; Family member/caregiver Pain: Pain Assessment Pain Assessment: No/denies pain End of Session: Start Time:SLP Start Time (ACUTE ONLY): 9074 Stop Time: SLP Stop Time (ACUTE ONLY): 1000 Time Calculation:SLP Time Calculation (min) (ACUTE ONLY): 35 min Charges: SLP Evaluations $ SLP Speech Visit: 1 Visit SLP Evaluations $BSS Swallow: 1 Procedure $MBS Swallow: 1 Procedure SLP visit diagnosis: SLP Visit Diagnosis: Dysphagia, oropharyngeal  phase (R13.12); Dysphagia, pharyngoesophageal phase (R13.14) Past Medical History: Past Medical  History: Diagnosis Date  Acute lower GI bleeding 2008  Anxiety   Asthma   Carotid artery disease   Asymptomatic left carotid bruit  COPD (chronic obstructive pulmonary disease) (HCC)   Coronary artery disease   a. CABG - 1999 with LIMA-LAD, SVG-DIAG, SVG-OM, SVG-RPDA  b. Cath in setting of NSTEMI 03/18/2015: patent LIMA-LAD, occluded SVG-RCA, occluded SVG-D1, 99% stenosis of SVG-OM treated w/ DES  Depression   Dyslipidemia   Erectile dysfunction   Essential hypertension   History of blood transfusion 2008  History of stroke   Hyperlipidemia   Myocardial infarction [facility])  Past Surgical History: Past Surgical History: Procedure Laterality Date  CARDIAC CATHETERIZATION    CARDIAC CATHETERIZATION N/A 03/18/2015  Procedure: Left Heart Cath and Cors/Grafts Angiography;  Surgeon: Alm LELON Clay, MD;  Location: [facility] INVASIVE CV LAB;  Service: Cardiovascular;  Laterality: N/A;  CARDIAC CATHETERIZATION N/A 03/18/2015  Procedure: Coronary Stent Intervention;  Surgeon: Alm LELON Clay, MD;  Location: [facility] INVASIVE CV LAB;  Service: Cardiovascular;  Laterality: N/A;  CATARACT EXTRACTION W/ INTRAOCULAR LENS  IMPLANT, BILATERAL Bilateral   CORONARY ANGIOPLASTY    CORONARY ARTERY BYPASS GRAFT  1999  CORONARY STENT INTERVENTION N/A 04/22/2020  Procedure: CORONARY STENT INTERVENTION;  Surgeon: Claudene Victory LELON, MD;  Location: [facility] INVASIVE CV LAB;  Service: Cardiovascular;  Laterality: N/A;  FRACTURE SURGERY    HERNIA REPAIR    LEFT HEART CATH AND CORS/GRAFTS ANGIOGRAPHY N/A 04/22/2020  Procedure: LEFT HEART CATH AND CORS/GRAFTS ANGIOGRAPHY;  Surgeon: Claudene Victory LELON, MD;  Location: [facility] INVASIVE CV LAB;  Service: Cardiovascular;  Laterality: N/A;  TIBIA FRACTURE SURGERY Left [facility]; took bone out of my right hip  TONSILLECTOMY    UMBILICAL HERNIA REPAIR  ~ 2000 Pat Adams,M.S.,CCC-SLP 02/18/2024, 12:42 PM  DG Chest Port 1 View Result Date: 02/18/2024 EXAM: 1 VIEW(S) XRAY OF THE CHEST 02/18/2024 12:46:00 AM COMPARISON:  02/17/2024 CLINICAL HISTORY: SOB (shortness of breath) [id number] FINDINGS: LUNGS AND PLEURA: Stable increased central vascularity is noted with very mild interstitial edema. No focal confluent infiltrate is seen. No sizable effusion is noted. No pneumothorax. Postsurgical changes are again noted. HEART AND MEDIASTINUM: Stable cardiomegaly. No acute abnormality of the mediastinal silhouette. BONES AND SOFT TISSUES: No acute osseous abnormality. IMPRESSION: 1. Stable increased central vascularity with very mild interstitial edema. No focal confluent infiltrate or sizable effusion. 2. Stable cardiomegaly. Electronically signed by: Oneil Devonshire MD 02/18/2024 12:53 AM EST RP Workstation: [id number]   DG Chest Port 1 View Result Date: 02/17/2024 EXAM: 1 VIEW(S) XRAY OF THE CHEST 02/17/2024 06:25:02 AM COMPARISON: 02/15/2024 CLINICAL HISTORY: Coarse respiratory crackles FINDINGS: LUNGS AND PLEURA: There are hazy and reticular opacities present bilaterally, more pronounced in the lung bases, which could reflect alveolar and interstitial edema or pneumonitis. No pleural effusion. No pneumothorax. HEART AND MEDIASTINUM: Mild cardiomegaly. Aortic arch atherosclerosis. CABG markers noted. BONES AND SOFT TISSUES: Median sternotomy wires and CABG markers noted. IMPRESSION: 1. Hazy and reticular opacities bilaterally, more pronounced in the lung bases, possibly reflecting alveolar and interstitial edema or pneumonitis. 2. Mild cardiomegaly and aortic arch atherosclerosis. Electronically signed by: Evalene Coho MD 02/17/2024 06:45 AM EST RP Workstation: [id number]   CT HEAD WO CONTRAST ( ) Result Date: 02/15/2024 EXAM: CT HEAD WITHOUT CONTRAST 02/15/2024 05:31:00 PM TECHNIQUE: CT of the head was performed without the administration of additional intravenous contrast. Residual intravenous contrast material from a CT performed earlier the same day is  noted. Automated exposure control, iterative reconstruction, and/or weight  based adjustment of the mA/kV was utilized to reduce the radiation dose to as low as reasonably achievable. COMPARISON: None available. CLINICAL HISTORY: Head trauma, minor (Age >= 65y) FINDINGS: BRAIN AND VENTRICLES: No acute hemorrhage. No evidence of acute infarct. No hydrocephalus. No extra-axial collection. No mass effect or midline shift. Left frontoparietal encephalomalacia. Multiple scattered remote cerebellar infarcts noted. Moderate global atrophy. Patchy supratentorial white matter hypoattenuation, possibly reflecting chronic microvascular ischemic changes. Intracranial atherosclerosis. 5 mm right MCA bifurcation aneurysm noted (series 8, image 19). ORBITS: No acute abnormality. SINUSES: No acute abnormality. SOFT TISSUES AND SKULL: Right parietal scalp contusion. No skull fracture. IMPRESSION: 1. No acute intracranial abnormality related to the head trauma. 2. Right parietal scalp contusion. 3. 5 mm right MCA bifurcation aneurysm. Electronically signed by: Morene Hoard MD 02/15/2024 06:19 PM EST RP Workstation: [id number]   CT ANGIO GI BLEED Result Date: 02/15/2024 CLINICAL DATA:  Lower GI bleed (Ped 0-17y) EXAM: CTA ABDOMEN AND PELVIS WITHOUT AND WITH CONTRAST TECHNIQUE: Initially, a noncontrast CT of the abdomen and pelvis was performed. Subsequently, multidetector CT imaging of the abdomen and pelvis was performed using the standard protocol during bolus administration of intravenous contrast. Multiplanar reconstructed images and MIPs were obtained and reviewed to evaluate the vascular anatomy. RADIATION DOSE REDUCTION: This exam was performed according to the departmental dose-optimization program which includes automated exposure control, adjustment of the mA and/or kV according to patient size and/or use of iterative reconstruction technique. CONTRAST:  OMNIPAQUE  IOHEXOL  350 MG/ML SOLN COMPARISON:  02/15/2024 at 12:18 a.m. FINDINGS: VASCULAR Aorta: No aortic aneurysm or dissection.  Similar fusiform ectasia of the infrarenal aorta measuring 2.7 cm. Extensive calcified atherosclerosis throughout the aorta. No hemodynamically significant stenosis. Celiac: Patent without acute thrombus, aneurysm, or dissection.No hemodynamically significant stenosis. SMA: Patent without acute thrombus, aneurysm, or dissection.Severe to critical stenosis of the proximal SMA from calcified plaque. Renals: Patent without acute thrombus or dissection.Moderate stenoses of the proximal renal arteries bilaterally due to calcified plaque. 8 mm peripherally calcified aneurysm arising from the posterior aspect of the upstream right renal artery. IMA: Patent without acute thrombus, aneurysm, or dissection.No hemodynamically significant stenosis. Inflow: Patent without acute thrombus, aneurysm, or dissection.No hemodynamically significant stenosis. Proximal Outflow: The bilateral common femoral and visualized portions of the superficial and profunda femoral arteries are patent without acute thrombus, aneurysm, or dissection.No hemodynamically significant stenosis. Veins: No obvious venous abnormality within the limitations of this arterial phase study. Review of the MIP images confirms the above findings. NON-VASCULAR Lower chest: No focal airspace consolidation or pleural effusion. Hepatobiliary: No mass.Cholecystectomy.No intrahepatic or extrahepatic biliary ductal dilation. Pancreas: No mass or main ductal dilation.No peripancreatic inflammation or fluid collection. Spleen: Normal size. No mass. Adrenals/Urinary Tract: No adrenal masses. Unchanged 2 cm left upper pole cyst. Persistent contrast opacification of the renal cortices on the noncontrast imaging. No hydronephrosis or nephrolithiasis. Excreted contrast filling the bladder lumen. 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 The stomach is decompressed without acute abnormality. No small bowel wall  thickening or inflammation. No small bowel obstruction.Normal appendix. Total colonic diverticulosis. No changes of acute diverticulitis. GI Bleed: Contrast extravasation along the hepatic flexure of the colon (axial 22). Layering hyperdense material within multiple diverticula in the descending and sigmoid colon is present, possibly ingested material or layering contrast material from prior study. Lymphatic: No intraabdominal or pelvic lymphadenopathy. Reproductive: No prostatomegaly.No free pelvic fluid. Other: No pneumoperitoneum, ascites, or  mesenteric inflammation. Musculoskeletal: No acute fracture or destructive lesion.Diffuse osteopenia. Multilevel degenerative disc disease of the spine. IMPRESSION: VASCULAR 1. Active GI bleeding along the hepatic flexure of the colon, possibly diverticular in nature. GI consultation recommended further management. 2. No aortic aneurysm or aortic dissection. NON-VASCULAR 1. 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 While no morphologic changes of cirrhosis are visualized, clinical and laboratory correlation requested. 2. Extensive total colonic diverticulosis. No changes of acute diverticulitis. 3. Persistent contrast opacification of the renal cortices on the noncontrast imaging, which can be seen in acute medical renal disease, possibly ATN. Critical Value/emergent results were called by telephone at the time of interpretation on 02/15/2024 at 11:00 am to provider [facility] , who verbally acknowledged these results. Aortic Atherosclerosis (ICD10-I70.0). Electronically Signed   By: Rogelia Myers M.D.   On: 02/15/2024 11:08   DG Chest Port 1 View Result Date: 02/15/2024 EXAM: 1 VIEW(S) XRAY OF THE CHEST 02/15/2024 01:39:00 AM COMPARISON: 09/12/2023 CLINICAL HISTORY: Questionable sepsis - evaluate for abnormality FINDINGS: LUNGS AND PLEURA: Mild patchy bilateral lower lobe opacities, atelectasis versus  pneumonia. Increased interstitial markings without frank interstitial edema. No pleural effusion. No pneumothorax. HEART AND MEDIASTINUM: Stable mild cardiomegaly. Status post CABG. BONES AND SOFT TISSUES: Median sternotomy. No acute osseous abnormality. IMPRESSION: 1. Mild patchy bilateral lower lobe opacities, atelectasis versus pneumonia. 2. Stable mild cardiomegaly. No frank interstitial edema. Electronically signed by: Pinkie Pebbles MD 02/15/2024 01:44 AM EST RP Workstation: [id number]   CT ANGIO GI BLEED Result Date: 02/15/2024 EXAM: CTA ABDOMEN AND PELVIS WITH AND WITHOUT CONTRAST 02/15/2024 12:18:04 AM TECHNIQUE: CTA images of the abdomen and pelvis without and with intravenous contrast. Three-dimensional MIP/volume rendered formations were performed. Automated exposure control, iterative reconstruction, and/or weight based adjustment of the mA/kV was utilized to reduce the radiation dose to as low as reasonably achievable. CONTRAST: 75 mL iohexol  (OMNIPAQUE ) 350 MG/ML injection. COMPARISON: None available. CLINICAL HISTORY: Lower GI bleed. FINDINGS: VASCULATURE: GI BLEED: Following contrast administration, there is no intraluminal spillage of contrast to suggest active GI bleeding. AORTA: Atherosclerotic calcifications of the abdominal aorta, although patent. No acute finding. No abdominal aortic aneurysm. No dissection. CELIAC TRUNK: No acute finding. No occlusion or significant stenosis. SUPERIOR MESENTERIC ARTERY: Atherosclerotic calcifications of the proximal SMA, although patent. No acute finding. No occlusion or significant stenosis. INFERIOR MESENTERIC ARTERY: No acute finding. No occlusion or significant stenosis. RENAL ARTERIES: Atherosclerotic calcifications of the bilateral renal arteries, although patent. No acute finding. No occlusion or significant stenosis. ILIAC ARTERIES: No acute finding. No occlusion or significant stenosis. ABDOMEN/PELVIS: LOWER CHEST: Cardiomegaly. Mild patchy  bilateral lower lobe opacities, favoring pneumonia. Hypodense blood pool relative to myocardium, suggesting anemia. Median sternotomy, incompletely visualized. LIVER: The liver is unremarkable. GALLBLADDER AND BILE DUCTS: Status post cholecystectomy. No biliary ductal dilatation. SPLEEN: The spleen is unremarkable. PANCREAS: The pancreas is unremarkable. ADRENAL GLANDS: Bilateral adrenal glands demonstrate no acute abnormality. KIDNEYS, URETERS AND BLADDER: No stones in the kidneys or ureters. No hydronephrosis. No perinephric or periureteral stranding. Urinary bladder is unremarkable. GI AND BOWEL: Stomach and duodenal sweep demonstrate no acute abnormality. Normal appendix (image 51). Left colonic diverticulosis, without evidence of diverticulitis. There is no bowel obstruction. No abnormal bowel wall thickening or distension. REPRODUCTIVE: The prostate is unremarkable. PERITONEUM AND RETROPERITONEUM: No ascites or free air. LYMPH NODES: No lymphadenopathy. BONES AND SOFT TISSUES: Mild degenerative changes of the visualized thoracolumbar spine. No acute soft tissue abnormality. IMPRESSION: 1. No active GI  bleeding. 2. Mild patchy bilateral lower lobe opacities, favoring pneumonia. 3. Additional ancillary findings, as above. Electronically signed by: Pinkie Pebbles MD 02/15/2024 12:35 AM EST RP Workstation: [id number]    Microbiology: Results for orders placed or performed during the hospital encounter of 02/14/24  Blood Culture (routine x 2)     Status: None (Preliminary result)   Collection Time: 02/15/24  1:55 AM   Specimen: BLOOD  Result Value Ref Range Status   Specimen Description BLOOD RIGHT ANTECUBITAL  Final   Special Requests   Final    BOTTLES DRAWN AEROBIC AND ANAEROBIC Blood Culture adequate volume   Culture   Final    NO GROWTH 4 DAYS Performed at [facility], [address] [place], [place] [address]    Report Status PENDING  Incomplete  Blood Culture (routine x 2)      Status: None (Preliminary result)   Collection Time: 02/15/24  2:00 AM   Specimen: BLOOD  Result Value Ref Range Status   Specimen Description BLOOD LEFT ANTECUBITAL  Final   Special Requests   Final    BOTTLES DRAWN AEROBIC AND ANAEROBIC Blood Culture adequate volume   Culture   Final    NO GROWTH 4 DAYS Performed at [facility], [address] [place], [place] [address]    Report Status PENDING  Incomplete  MRSA Next Gen by PCR, Nasal     Status: None   Collection Time: 02/15/24  4:22 AM   Specimen: Nasal Mucosa; Nasal Swab  Result Value Ref Range Status   MRSA by PCR Next Gen NOT DETECTED NOT DETECTED Final    Comment: (NOTE) The GeneXpert MRSA Assay (FDA approved for NASAL specimens only), is one component of a comprehensive MRSA colonization surveillance program. It is not intended to diagnose MRSA infection nor to guide or monitor treatment for MRSA infections. Test performance is not FDA approved in patients less than 37 years old. Performed at [facility], [address], [place], [place] [address]     Labs: CBC: Recent Labs  Lab 02/14/24 2348 02/14/24 2355 02/16/24 0234 02/16/24 1819 02/17/24 0444 02/18/24 0608 02/19/24 0450  WBC 20.4*   < > 21.3* 36.4* 20.8* 12.1* 11.2*  NEUTROABS 18.6*  --   --   --  18.7*  --   --   HGB 8.3*   < > 8.7* 8.0* 7.5* 8.0* 7.9*  HCT 27.0*   < > 25.5* 23.3* 21.1* 23.3* 23.4*  MCV 88.2   < > 83.9 84.7 85.4 85.7 87.3  PLT 308   < > 238 241 155 133* 145*   < > = values in this interval not displayed.   Basic Metabolic Panel: Recent Labs  Lab 02/15/24 0611 02/16/24 0234 02/17/24 0444 02/18/24 0608 02/19/24 0450  NA 140 137 133* 134* 136  K 4.9 4.1 3.6 3.2* 3.4*  CL 105 103 101 101 103  CO2 22 20* 22 24 25   GLUCOSE 126* 158* 139* 111* 109*  BUN 28* 41* 47* 39* 33*  CREATININE 1.34* 2.13* 2.13* 1.43* 1.03  CALCIUM  8.4* 8.4* 8.0* 8.0* 8.1*  MG 2.1  --   --  2.2  --    Liver Function Tests: Recent Labs   Lab 02/14/24 2348  AST 35  ALT 15  ALKPHOS 56  BILITOT 1.3*  PROT 5.3*  ALBUMIN 2.8*   CBG: Recent Labs  Lab 02/15/24 0422  GLUCAP 114*    Discharge time spent: {LESS THAN/GREATER  UYJW:73611} 30 minutes.  Signed: Elgin Lam, MD [facility] 02/19/2024 "

## 2024-02-19 NOTE — Progress Notes (Signed)
 Patient son, [name], expressed concern with RN regarding medical transport at discharge with RN. RN educated [name] on this process. [name] and patient no longer wish to use medical transport at discharge. [name] will provide transportation for patient.

## 2024-02-19 NOTE — Plan of Care (Signed)
  Problem: Activity: Goal: Risk for activity intolerance will decrease Outcome: Progressing   Problem: Nutrition: Goal: Adequate nutrition will be maintained Outcome: Progressing   Problem: Coping: Goal: Level of anxiety will decrease Outcome: Progressing   Problem: Elimination: Goal: Will not experience complications related to bowel motility Outcome: Progressing Goal: Will not experience complications related to urinary retention Outcome: Progressing   Problem: Safety: Goal: Ability to remain free from injury will improve Outcome: Progressing

## 2024-02-20 ENCOUNTER — Other Ambulatory Visit: Payer: Self-pay

## 2024-02-20 ENCOUNTER — Encounter (HOSPITAL_COMMUNITY): Payer: Self-pay

## 2024-02-20 ENCOUNTER — Emergency Department (HOSPITAL_COMMUNITY)

## 2024-02-20 ENCOUNTER — Inpatient Hospital Stay (HOSPITAL_COMMUNITY)
Admission: EM | Admit: 2024-02-20 | Discharge: 2024-03-07 | DRG: 193 | Disposition: A | Discharge status: Skilled Nursing Facility | Attending: Internal Medicine | Admitting: Internal Medicine

## 2024-02-20 DIAGNOSIS — I69322 Dysarthria following cerebral infarction: Secondary | ICD-10-CM

## 2024-02-20 DIAGNOSIS — I5023 Acute on chronic systolic (congestive) heart failure: Secondary | ICD-10-CM | POA: Diagnosis not present

## 2024-02-20 DIAGNOSIS — B379 Candidiasis, unspecified: Secondary | ICD-10-CM | POA: Diagnosis present

## 2024-02-20 DIAGNOSIS — F32A Depression, unspecified: Secondary | ICD-10-CM | POA: Diagnosis present

## 2024-02-20 DIAGNOSIS — Y95 Nosocomial condition: Secondary | ICD-10-CM | POA: Diagnosis present

## 2024-02-20 DIAGNOSIS — Z823 Family history of stroke: Secondary | ICD-10-CM

## 2024-02-20 DIAGNOSIS — I493 Ventricular premature depolarization: Secondary | ICD-10-CM | POA: Diagnosis present

## 2024-02-20 DIAGNOSIS — Z7984 Long term (current) use of oral hypoglycemic drugs: Secondary | ICD-10-CM

## 2024-02-20 DIAGNOSIS — J984 Other disorders of lung: Secondary | ICD-10-CM | POA: Diagnosis present

## 2024-02-20 DIAGNOSIS — J440 Chronic obstructive pulmonary disease with acute lower respiratory infection: Secondary | ICD-10-CM | POA: Diagnosis present

## 2024-02-20 DIAGNOSIS — D500 Iron deficiency anemia secondary to blood loss (chronic): Secondary | ICD-10-CM | POA: Diagnosis present

## 2024-02-20 DIAGNOSIS — I130 Hypertensive heart and chronic kidney disease with heart failure and stage 1 through stage 4 chronic kidney disease, or unspecified chronic kidney disease: Secondary | ICD-10-CM | POA: Diagnosis present

## 2024-02-20 DIAGNOSIS — R1310 Dysphagia, unspecified: Secondary | ICD-10-CM

## 2024-02-20 DIAGNOSIS — J9621 Acute and chronic respiratory failure with hypoxia: Secondary | ICD-10-CM | POA: Diagnosis present

## 2024-02-20 DIAGNOSIS — I480 Paroxysmal atrial fibrillation: Secondary | ICD-10-CM | POA: Diagnosis present

## 2024-02-20 DIAGNOSIS — K222 Esophageal obstruction: Secondary | ICD-10-CM | POA: Diagnosis present

## 2024-02-20 DIAGNOSIS — R1312 Dysphagia, oropharyngeal phase: Secondary | ICD-10-CM | POA: Diagnosis present

## 2024-02-20 DIAGNOSIS — J188 Other pneumonia, unspecified organism: Principal | ICD-10-CM | POA: Diagnosis present

## 2024-02-20 DIAGNOSIS — Z87891 Personal history of nicotine dependence: Secondary | ICD-10-CM

## 2024-02-20 DIAGNOSIS — F419 Anxiety disorder, unspecified: Secondary | ICD-10-CM | POA: Diagnosis present

## 2024-02-20 DIAGNOSIS — K1230 Oral mucositis (ulcerative), unspecified: Secondary | ICD-10-CM | POA: Diagnosis present

## 2024-02-20 DIAGNOSIS — R001 Bradycardia, unspecified: Secondary | ICD-10-CM | POA: Diagnosis present

## 2024-02-20 DIAGNOSIS — N1832 Chronic kidney disease, stage 3b: Secondary | ICD-10-CM | POA: Diagnosis present

## 2024-02-20 DIAGNOSIS — Z66 Do not resuscitate: Secondary | ICD-10-CM | POA: Diagnosis present

## 2024-02-20 DIAGNOSIS — G4700 Insomnia, unspecified: Secondary | ICD-10-CM | POA: Diagnosis present

## 2024-02-20 DIAGNOSIS — Z7401 Bed confinement status: Secondary | ICD-10-CM

## 2024-02-20 DIAGNOSIS — J189 Pneumonia, unspecified organism: Principal | ICD-10-CM | POA: Diagnosis present

## 2024-02-20 DIAGNOSIS — I482 Chronic atrial fibrillation, unspecified: Secondary | ICD-10-CM | POA: Diagnosis present

## 2024-02-20 DIAGNOSIS — Z8249 Family history of ischemic heart disease and other diseases of the circulatory system: Secondary | ICD-10-CM

## 2024-02-20 DIAGNOSIS — I252 Old myocardial infarction: Secondary | ICD-10-CM

## 2024-02-20 DIAGNOSIS — Z79899 Other long term (current) drug therapy: Secondary | ICD-10-CM

## 2024-02-20 DIAGNOSIS — I2510 Atherosclerotic heart disease of native coronary artery without angina pectoris: Secondary | ICD-10-CM | POA: Diagnosis present

## 2024-02-20 DIAGNOSIS — Z882 Allergy status to sulfonamides status: Secondary | ICD-10-CM

## 2024-02-20 DIAGNOSIS — I69391 Dysphagia following cerebral infarction: Secondary | ICD-10-CM

## 2024-02-20 DIAGNOSIS — K5730 Diverticulosis of large intestine without perforation or abscess without bleeding: Secondary | ICD-10-CM | POA: Diagnosis present

## 2024-02-20 DIAGNOSIS — Z515 Encounter for palliative care: Secondary | ICD-10-CM

## 2024-02-20 DIAGNOSIS — J159 Unspecified bacterial pneumonia: Secondary | ICD-10-CM | POA: Diagnosis present

## 2024-02-20 DIAGNOSIS — Z888 Allergy status to other drugs, medicaments and biological substances status: Secondary | ICD-10-CM

## 2024-02-20 DIAGNOSIS — Z955 Presence of coronary angioplasty implant and graft: Secondary | ICD-10-CM

## 2024-02-20 DIAGNOSIS — T17808A Unspecified foreign body in other parts of respiratory tract causing other injury, initial encounter: Secondary | ICD-10-CM | POA: Diagnosis present

## 2024-02-20 DIAGNOSIS — Z6821 Body mass index (BMI) 21.0-21.9, adult: Secondary | ICD-10-CM

## 2024-02-20 DIAGNOSIS — J121 Respiratory syncytial virus pneumonia: Secondary | ICD-10-CM | POA: Diagnosis not present

## 2024-02-20 DIAGNOSIS — Z1152 Encounter for screening for COVID-19: Secondary | ICD-10-CM

## 2024-02-20 DIAGNOSIS — J690 Pneumonitis due to inhalation of food and vomit: Principal | ICD-10-CM | POA: Diagnosis present

## 2024-02-20 DIAGNOSIS — Z7901 Long term (current) use of anticoagulants: Secondary | ICD-10-CM

## 2024-02-20 DIAGNOSIS — Z7982 Long term (current) use of aspirin: Secondary | ICD-10-CM

## 2024-02-20 DIAGNOSIS — E785 Hyperlipidemia, unspecified: Secondary | ICD-10-CM | POA: Diagnosis present

## 2024-02-20 DIAGNOSIS — I671 Cerebral aneurysm, nonruptured: Secondary | ICD-10-CM | POA: Diagnosis present

## 2024-02-20 DIAGNOSIS — D649 Anemia, unspecified: Secondary | ICD-10-CM | POA: Diagnosis present

## 2024-02-20 DIAGNOSIS — H9190 Unspecified hearing loss, unspecified ear: Secondary | ICD-10-CM | POA: Diagnosis present

## 2024-02-20 DIAGNOSIS — R54 Age-related physical debility: Secondary | ICD-10-CM | POA: Diagnosis present

## 2024-02-20 DIAGNOSIS — K5520 Angiodysplasia of colon without hemorrhage: Secondary | ICD-10-CM | POA: Diagnosis present

## 2024-02-20 DIAGNOSIS — E440 Moderate protein-calorie malnutrition: Secondary | ICD-10-CM | POA: Diagnosis present

## 2024-02-20 LAB — CBC WITH DIFFERENTIAL/PLATELET
Abs Immature Granulocytes: 0.11 K/uL — ABNORMAL HIGH (ref 0.00–0.07)
Basophils Absolute: 0 K/uL (ref 0.0–0.1)
Basophils Relative: 0 %
Eosinophils Absolute: 0.1 K/uL (ref 0.0–0.5)
Eosinophils Relative: 1 %
HCT: 25.9 % — ABNORMAL LOW (ref 39.0–52.0)
Hemoglobin: 8.2 g/dL — ABNORMAL LOW (ref 13.0–17.0)
Immature Granulocytes: 1 %
Lymphocytes Relative: 4 %
Lymphs Abs: 0.6 K/uL — ABNORMAL LOW (ref 0.7–4.0)
MCH: 29.7 pg (ref 26.0–34.0)
MCHC: 31.7 g/dL (ref 30.0–36.0)
MCV: 93.8 fL (ref 80.0–100.0)
Monocytes Absolute: 1 K/uL (ref 0.1–1.0)
Monocytes Relative: 7 %
Neutro Abs: 11.5 K/uL — ABNORMAL HIGH (ref 1.7–7.7)
Neutrophils Relative %: 87 %
Platelets: 242 K/uL (ref 150–400)
RBC: 2.76 MIL/uL — ABNORMAL LOW (ref 4.22–5.81)
RDW: 17.3 % — ABNORMAL HIGH (ref 11.5–15.5)
WBC: 13.3 K/uL — ABNORMAL HIGH (ref 4.0–10.5)
nRBC: 0 % (ref 0.0–0.2)

## 2024-02-20 LAB — I-STAT VENOUS BLOOD GAS, ED
Acid-Base Excess: 0 mmol/L (ref 0.0–2.0)
Bicarbonate: 25.5 mmol/L (ref 20.0–28.0)
Calcium, Ion: 1.16 mmol/L (ref 1.15–1.40)
HCT: 25 % — ABNORMAL LOW (ref 39.0–52.0)
Hemoglobin: 8.5 g/dL — ABNORMAL LOW (ref 13.0–17.0)
O2 Saturation: 44 %
Potassium: 4.1 mmol/L (ref 3.5–5.1)
Sodium: 139 mmol/L (ref 135–145)
TCO2: 27 mmol/L (ref 22–32)
pCO2, Ven: 44.2 mmHg (ref 44–60)
pH, Ven: 7.368 (ref 7.25–7.43)
pO2, Ven: 25 mmHg — CL (ref 32–45)

## 2024-02-20 LAB — COMPREHENSIVE METABOLIC PANEL WITH GFR
ALT: 24 U/L (ref 0–44)
AST: 33 U/L (ref 15–41)
Albumin: 3.2 g/dL — ABNORMAL LOW (ref 3.5–5.0)
Alkaline Phosphatase: 85 U/L (ref 38–126)
Anion gap: 9 (ref 5–15)
BUN: 30 mg/dL — ABNORMAL HIGH (ref 8–23)
CO2: 25 mmol/L (ref 22–32)
Calcium: 8.7 mg/dL — ABNORMAL LOW (ref 8.9–10.3)
Chloride: 103 mmol/L (ref 98–111)
Creatinine, Ser: 1.24 mg/dL (ref 0.61–1.24)
GFR, Estimated: 58 mL/min — ABNORMAL LOW
Glucose, Bld: 121 mg/dL — ABNORMAL HIGH (ref 70–99)
Potassium: 4.2 mmol/L (ref 3.5–5.1)
Sodium: 137 mmol/L (ref 135–145)
Total Bilirubin: 0.7 mg/dL (ref 0.0–1.2)
Total Protein: 5.7 g/dL — ABNORMAL LOW (ref 6.5–8.1)

## 2024-02-20 LAB — I-STAT CHEM 8, ED
BUN: 27 mg/dL — ABNORMAL HIGH (ref 8–23)
Calcium, Ion: 1.18 mmol/L (ref 1.15–1.40)
Chloride: 102 mmol/L (ref 98–111)
Creatinine, Ser: 1.4 mg/dL — ABNORMAL HIGH (ref 0.61–1.24)
Glucose, Bld: 120 mg/dL — ABNORMAL HIGH (ref 70–99)
HCT: 26 % — ABNORMAL LOW (ref 39.0–52.0)
Hemoglobin: 8.8 g/dL — ABNORMAL LOW (ref 13.0–17.0)
Potassium: 4.1 mmol/L (ref 3.5–5.1)
Sodium: 140 mmol/L (ref 135–145)
TCO2: 23 mmol/L (ref 22–32)

## 2024-02-20 LAB — PROTIME-INR
INR: 1.2 (ref 0.8–1.2)
Prothrombin Time: 15.8 s — ABNORMAL HIGH (ref 11.4–15.2)

## 2024-02-20 LAB — PRO BRAIN NATRIURETIC PEPTIDE: Pro Brain Natriuretic Peptide: 7935 pg/mL — ABNORMAL HIGH

## 2024-02-20 LAB — TROPONIN T, HIGH SENSITIVITY: Troponin T High Sensitivity: 72 ng/L — ABNORMAL HIGH (ref 0–19)

## 2024-02-20 LAB — I-STAT CG4 LACTIC ACID, ED: Lactic Acid, Venous: 1 mmol/L (ref 0.5–1.9)

## 2024-02-20 LAB — CULTURE, BLOOD (ROUTINE X 2)
Culture: NO GROWTH
Culture: NO GROWTH
Special Requests: ADEQUATE
Special Requests: ADEQUATE

## 2024-02-20 LAB — MAGNESIUM: Magnesium: 2.5 mg/dL — ABNORMAL HIGH (ref 1.7–2.4)

## 2024-02-20 MED ORDER — ACETAMINOPHEN 325 MG PO TABS
650.0000 mg | ORAL_TABLET | Freq: Once | ORAL | Status: AC
  Administered 2024-02-20: 650 mg via ORAL
  Filled 2024-02-20: qty 2

## 2024-02-20 MED ORDER — VANCOMYCIN HCL IN DEXTROSE 1-5 GM/200ML-% IV SOLN: 1000.0000 mg | Freq: Once | INTRAVENOUS | Status: DC

## 2024-02-20 MED ORDER — VANCOMYCIN HCL 1750 MG/350ML IV SOLN
1750.0000 mg | Freq: Once | INTRAVENOUS | Status: AC
  Administered 2024-02-21: 1750 mg via INTRAVENOUS
  Filled 2024-02-20: qty 350

## 2024-02-20 MED ORDER — LACTATED RINGERS IV BOLUS (SEPSIS): 500.0000 mL | Freq: Once | INTRAVENOUS | Status: DC

## 2024-02-20 MED ORDER — SODIUM CHLORIDE 0.9 % IV SOLN
2.0000 g | Freq: Once | INTRAVENOUS | Status: AC
  Administered 2024-02-20: 2 g via INTRAVENOUS
  Filled 2024-02-20: qty 12.5

## 2024-02-20 MED ORDER — LACTATED RINGERS IV BOLUS (SEPSIS)
250.0000 mL | Freq: Once | INTRAVENOUS | Status: AC
  Administered 2024-02-20: 250 mL via INTRAVENOUS

## 2024-02-20 MED ORDER — METRONIDAZOLE 500 MG/100ML IV SOLN
500.0000 mg | Freq: Once | INTRAVENOUS | Status: AC
  Administered 2024-02-20: 500 mg via INTRAVENOUS
  Filled 2024-02-20: qty 100

## 2024-02-20 NOTE — ED Provider Notes (Signed)
 " [place] EMERGENCY DEPARTMENT AT [facility] Provider Note   CSN: [id number] Arrival date & time: 02/20/24  2255     Patient presents with: Shortness of Breath   William Elliott is a 83 y.o. male.  {Add pertinent medical, surgical, social history, OB history to YEP:67052}  Shortness of Breath Associated symptoms: cough   Patient presenting for shortness of breath.  Medical history includes COPD, CAD, HLD, HTN, CVA, anxiety, atrial fibrillation.  He was recently admitted to the hospital***.  He was admitted to the hospital a week ago for hematochezia.  He required ICU admission and blood transfusion.  He underwent colonoscopy with APC of 2 angiectasias.  In total, he received 4 units PRBC, 1 unit FFP, and 1 unit of platelets.  While in the hospital, he did develop pneumonia as well as heart failure exacerbation.  He was treated with IV Lasix  and 5 days of ceftriaxone .  He was discharged yesterday.  He arrives today via EMS from home.  EMS was called due to shortness of breath.  On arrival, he was hypoxic on his baseline supplemental oxygen.  He was given 3X DuoNeb and 10 mg of Decadron prior to arrival.  He has since been maintained normal SpO2 on his baseline 2 L.  He was noted to be febrile and tachypneic prior to arrival.  Patient, himself, denies any current areas of discomfort.     Prior to Admission medications  Medication Sig Start Date End Date Taking? Authorizing Provider  acetaminophen  (TYLENOL ) 500 MG tablet Take 1,000 mg by mouth 2 (two) times daily as needed for moderate pain (pain score 4-6), fever or headache.    [provider]  albuterol  (VENTOLIN  HFA) 108 (90 Base) MCG/ACT inhaler Inhale 2 puffs into the lungs every 6 (six) hours as needed for wheezing or shortness of breath.    [provider]  ALPRAZolam  (XANAX ) 1 MG tablet Take 1 tablet (1 mg total) by mouth at bedtime as needed for anxiety. 11/01/16   Triplett, Tammy, PA-C  [Paused]  amLODipine  (NORVASC ) 5 MG tablet Take 5 mg by mouth daily. Wait to take this until your doctor or other care provider tells you to start again. 02/02/24   [provider]  Ascorbic Acid  (VITAMIN C  PO) Take 1 tablet by mouth daily with breakfast.    [provider]  aspirin  EC 81 MG tablet Take 1 tablet (81 mg total) by mouth daily. 02/12/17   Debera Jayson MATSU, MD  B Complex-C (SUPER B COMPLEX PO) Take 1 capsule by mouth daily with breakfast.    [provider]  CHELATED ZINC  PO Take 1 tablet by mouth daily with breakfast.    [provider]  empagliflozin  (JARDIANCE ) 10 MG TABS tablet Take 1 tablet (10 mg total) by mouth daily. 10/07/23   Hayes Beckey CROME, NP  Ferrous Sulfate  (IRON PO) Take 1 tablet by mouth daily with breakfast.    [provider]  furosemide  (LASIX ) 20 MG tablet Take 4 tablets (80 mg total) by mouth daily. Patient taking differently: Take 40 mg by mouth daily. 11/12/23   Lee, Jordan, NP  ipratropium-albuterol  (DUONEB) 0.5-2.5 (3) MG/3ML SOLN Take 3 mLs by nebulization every 4 (four) hours as needed (wheezing, shortness of breath). 03/11/23   [provider]  [Paused] isosorbide  mononitrate (IMDUR ) 30 MG 24 hr tablet Take 0.5 tablets (15 mg total) by mouth at bedtime. Wait to take this until your doctor or other care provider tells you  to start again. 10/07/23   Hayes Beckey CROME, NP  [Paused] losartan  (COZAAR ) 25 MG tablet Take 0.5 tablets (12.5 mg total) by mouth daily. Wait to take this until your doctor or other care provider tells you to start again. 11/12/23   Lee, Jordan, NP  mexiletine (MEXITIL ) 200 MG capsule TAKE ONE CAPSULE BY MOUTH TWICE DAILY 02/04/24   Marcine Catalan M, PA-C  Multiple Vitamins-Minerals (MENS 50+ MULTIVITAMIN) TABS Take 1 tablet by mouth daily with breakfast.    [provider]  NEXLIZET 180-10 MG TABS Take 1 tablet by mouth daily. 07/23/22   [provider]  nitroGLYCERIN  (NITROSTAT ) 0.4 MG SL  tablet DISSOLVE 1 TABLET UNDER THE TONGUE EVERY 5 MINUTES AS NEEDED FOR CHEST PAIN. DO NOT EXCEED 3 DOSES IN 15 MIN. CALL 911 IF NO RELIEF 04/19/23   Debera Jayson MATSU, MD  nystatin (MYCOSTATIN/NYSTOP) powder Apply 1 Application topically daily as needed (for dryness). 02/02/24   [provider]  Omega-3 Fatty Acids (FISH OIL PO) Take 1 capsule by mouth daily with breakfast.    [provider]  pantoprazole  (PROTONIX ) 40 MG tablet TAKE 1 TABLET BY MOUTH EVERY DAY 02/09/19   Debera Jayson MATSU, MD  potassium chloride  SA (KLOR-CON  M) 20 MEQ tablet Take 20 mEq by mouth daily. Take with Lasix     [provider]  [Paused] spironolactone  (ALDACTONE ) 25 MG tablet Take 1 tablet (25 mg total) by mouth daily. Wait to take this until your doctor or other care provider tells you to start again. 10/14/23   Milford, Harlene HERO, FNP  Tiotropium Bromide -Olodaterol (STIOLTO RESPIMAT ) 2.5-2.5 MCG/ACT AERS Inhale 2 puffs into the lungs daily.    [provider]    Allergies: Anoro ellipta  [umeclidinium-vilanterol], Sulfites, Lipitor [atorvastatin calcium ], Niaspan [niacin], Tricor [fenofibrate], and Zocor [simvastatin]    Review of Systems  Constitutional:  Positive for fatigue.  Respiratory:  Positive for cough and shortness of breath.   Neurological:  Positive for weakness (Generalized).  All other systems reviewed and are negative.   Updated Vital Signs There were no vitals taken for this visit.  Physical Exam Vitals and nursing note reviewed.  Constitutional:      General: He is not in acute distress.    Appearance: He is well-developed. He is ill-appearing. He is not toxic-appearing or diaphoretic.  HENT:     Head: Normocephalic and atraumatic.  Eyes:     Conjunctiva/sclera: Conjunctivae normal.  Cardiovascular:     Rate and Rhythm: Normal rate. Rhythm irregular.     Heart sounds: No murmur heard. Pulmonary:     Effort: Pulmonary effort is normal. Tachypnea  present. No respiratory distress.     Breath sounds: Wheezing and rhonchi present.  Chest:     Chest wall: No tenderness.  Abdominal:     Palpations: Abdomen is soft.     Tenderness: There is no abdominal tenderness.  Musculoskeletal:        General: No swelling.     Cervical back: Normal range of motion and neck supple.     Right lower leg: Edema present.     Left lower leg: Edema present.  Skin:    General: Skin is warm and dry.     Coloration: Skin is not cyanotic or pale.  Neurological:     General: No focal deficit present.     Mental Status: He is alert and oriented to person, place, and time.  Psychiatric:        Mood and Affect:  Mood normal.        Behavior: Behavior normal.     (all labs ordered are listed, but only abnormal results are displayed) Labs Reviewed  PRO BRAIN NATRIURETIC PEPTIDE  CBC WITH DIFFERENTIAL/PLATELET  COMPREHENSIVE METABOLIC PANEL WITH GFR    EKG: None  Radiology: No results found.  {Document cardiac monitor, telemetry assessment procedure when appropriate:32947} Procedures   Medications Ordered in the ED - No data to display    {Click here for ABCD2, HEART and other calculators REFRESH Note before signing:1}                              Medical Decision Making  This patient presents to the ED for concern of ***, this involves an extensive number of treatment options, and is a complaint that carries with it a high risk of complications and morbidity.  The differential diagnosis includes ***   Co morbidities / Chronic conditions that complicate the patient evaluation  ***   Additional history obtained:  Additional history obtained from EMR External records from outside source obtained and reviewed including ***   Lab Tests:  I Ordered, and personally interpreted labs.  The pertinent results include:  ***   Imaging Studies ordered:  I ordered imaging studies including ***  I independently visualized and interpreted  imaging which showed *** I agree with the radiologist interpretation   Cardiac Monitoring: / EKG:  The patient was maintained on a cardiac monitor.  I personally viewed and interpreted the cardiac monitored which showed an underlying rhythm of: ***   Problem List / ED Course / Critical interventions / Medication management  Patient presenting for shortness of breath.  Found to be hypoxic, febrile, and tachypneic with EMS.  Per chart review, he was admitted to the hospital a week ago and was treated for hematochezia.  He also reportedly developed concern of pneumonia while in the hospital and did receive 5 days of ceftriaxone .  EMS reports improvement in his oxygenation and work of breathing following DuoNebs and Decadron prior to arrival.  On arrival, patient remains tachypneic.  On lung auscultation, he has diffuse wheezing and rhonchi.  Septic workup and treatment were initiated.*** I ordered medication including ***   Reevaluation of the patient after these medicines showed that the patient *** I have reviewed the patients home medicines and have made adjustments as needed   Consultations Obtained:  I requested consultation with the ***,  and discussed lab and imaging findings as well as pertinent plan - they recommend: ***   Social Determinants of Health:  ***   Test / Admission - Considered:  ***   {Document critical care time when appropriate  Document review of labs and clinical decision tools ie CHADS2VASC2, etc  Document your independent review of radiology images and any outside records  Document your discussion with family members, caretakers and with consultants  Document social determinants of health affecting pt's care  Document your decision making why or why not admission, treatments were needed:32947:::1}   Final diagnoses:  None    ED Discharge Orders     None        "

## 2024-02-20 NOTE — ED Triage Notes (Signed)
 Pt BIB [facility] d/t SOB, Fever - just released yesterday from here for GI Bleed - 80s on RA O2. Gave duoneb and 2L and 100 % upon arrival.  CBG 164 BP 118/53 HR 80 irr afib 101.6 oral  22 g L wrist Gave 10 mg IV Decadron

## 2024-02-21 ENCOUNTER — Emergency Department (HOSPITAL_COMMUNITY)

## 2024-02-21 DIAGNOSIS — I482 Chronic atrial fibrillation, unspecified: Secondary | ICD-10-CM | POA: Diagnosis present

## 2024-02-21 DIAGNOSIS — Z1152 Encounter for screening for COVID-19: Secondary | ICD-10-CM | POA: Diagnosis not present

## 2024-02-21 DIAGNOSIS — J121 Respiratory syncytial virus pneumonia: Secondary | ICD-10-CM | POA: Diagnosis not present

## 2024-02-21 DIAGNOSIS — D500 Iron deficiency anemia secondary to blood loss (chronic): Secondary | ICD-10-CM | POA: Diagnosis present

## 2024-02-21 DIAGNOSIS — E785 Hyperlipidemia, unspecified: Secondary | ICD-10-CM | POA: Diagnosis present

## 2024-02-21 DIAGNOSIS — N1832 Chronic kidney disease, stage 3b: Secondary | ICD-10-CM | POA: Diagnosis present

## 2024-02-21 DIAGNOSIS — E440 Moderate protein-calorie malnutrition: Secondary | ICD-10-CM | POA: Diagnosis present

## 2024-02-21 DIAGNOSIS — Y95 Nosocomial condition: Secondary | ICD-10-CM | POA: Diagnosis present

## 2024-02-21 DIAGNOSIS — I69322 Dysarthria following cerebral infarction: Secondary | ICD-10-CM | POA: Diagnosis not present

## 2024-02-21 DIAGNOSIS — J189 Pneumonia, unspecified organism: Secondary | ICD-10-CM | POA: Diagnosis present

## 2024-02-21 DIAGNOSIS — I480 Paroxysmal atrial fibrillation: Secondary | ICD-10-CM | POA: Diagnosis present

## 2024-02-21 DIAGNOSIS — J159 Unspecified bacterial pneumonia: Secondary | ICD-10-CM | POA: Diagnosis present

## 2024-02-21 DIAGNOSIS — I5023 Acute on chronic systolic (congestive) heart failure: Secondary | ICD-10-CM | POA: Diagnosis not present

## 2024-02-21 DIAGNOSIS — Z743 Need for continuous supervision: Secondary | ICD-10-CM | POA: Diagnosis not present

## 2024-02-21 DIAGNOSIS — Z7401 Bed confinement status: Secondary | ICD-10-CM | POA: Diagnosis not present

## 2024-02-21 DIAGNOSIS — I671 Cerebral aneurysm, nonruptured: Secondary | ICD-10-CM | POA: Diagnosis present

## 2024-02-21 DIAGNOSIS — R1312 Dysphagia, oropharyngeal phase: Secondary | ICD-10-CM | POA: Diagnosis present

## 2024-02-21 DIAGNOSIS — J188 Other pneumonia, unspecified organism: Principal | ICD-10-CM | POA: Diagnosis present

## 2024-02-21 DIAGNOSIS — J9621 Acute and chronic respiratory failure with hypoxia: Secondary | ICD-10-CM | POA: Diagnosis present

## 2024-02-21 DIAGNOSIS — R0602 Shortness of breath: Secondary | ICD-10-CM | POA: Diagnosis not present

## 2024-02-21 DIAGNOSIS — Z7189 Other specified counseling: Secondary | ICD-10-CM | POA: Diagnosis not present

## 2024-02-21 DIAGNOSIS — J690 Pneumonitis due to inhalation of food and vomit: Secondary | ICD-10-CM | POA: Diagnosis present

## 2024-02-21 DIAGNOSIS — I130 Hypertensive heart and chronic kidney disease with heart failure and stage 1 through stage 4 chronic kidney disease, or unspecified chronic kidney disease: Secondary | ICD-10-CM | POA: Diagnosis present

## 2024-02-21 DIAGNOSIS — F32A Depression, unspecified: Secondary | ICD-10-CM | POA: Diagnosis present

## 2024-02-21 DIAGNOSIS — Z7901 Long term (current) use of anticoagulants: Secondary | ICD-10-CM | POA: Diagnosis not present

## 2024-02-21 DIAGNOSIS — T17808A Unspecified foreign body in other parts of respiratory tract causing other injury, initial encounter: Secondary | ICD-10-CM | POA: Diagnosis present

## 2024-02-21 DIAGNOSIS — I69391 Dysphagia following cerebral infarction: Secondary | ICD-10-CM | POA: Diagnosis not present

## 2024-02-21 DIAGNOSIS — J440 Chronic obstructive pulmonary disease with acute lower respiratory infection: Secondary | ICD-10-CM | POA: Diagnosis present

## 2024-02-21 DIAGNOSIS — Z66 Do not resuscitate: Secondary | ICD-10-CM | POA: Diagnosis present

## 2024-02-21 DIAGNOSIS — R6889 Other general symptoms and signs: Secondary | ICD-10-CM | POA: Diagnosis not present

## 2024-02-21 DIAGNOSIS — Z515 Encounter for palliative care: Secondary | ICD-10-CM | POA: Diagnosis not present

## 2024-02-21 LAB — RESPIRATORY PANEL BY PCR

## 2024-02-21 LAB — COMPREHENSIVE METABOLIC PANEL WITH GFR
ALT: 25 U/L (ref 0–44)
AST: 35 U/L (ref 15–41)
Albumin: 2.9 g/dL — ABNORMAL LOW (ref 3.5–5.0)
Alkaline Phosphatase: 80 U/L (ref 38–126)
Anion gap: 12 (ref 5–15)
BUN: 27 mg/dL — ABNORMAL HIGH (ref 8–23)
CO2: 20 mmol/L — ABNORMAL LOW (ref 22–32)
Calcium: 8.3 mg/dL — ABNORMAL LOW (ref 8.9–10.3)
Chloride: 104 mmol/L (ref 98–111)
Creatinine, Ser: 1.09 mg/dL (ref 0.61–1.24)
GFR, Estimated: >60 mL/min
Glucose, Bld: 130 mg/dL — ABNORMAL HIGH (ref 70–99)
Potassium: 4.6 mmol/L (ref 3.5–5.1)
Sodium: 136 mmol/L (ref 135–145)
Total Bilirubin: 0.5 mg/dL (ref 0.0–1.2)
Total Protein: 5.2 g/dL — ABNORMAL LOW (ref 6.5–8.1)

## 2024-02-21 LAB — RESP PANEL BY RT-PCR (RSV, FLU A&B, COVID)  RVPGX2
Influenza A by PCR: NEGATIVE
Influenza B by PCR: NEGATIVE
Resp Syncytial Virus by PCR: NEGATIVE
SARS Coronavirus 2 by RT PCR: NEGATIVE

## 2024-02-21 LAB — CBC
HCT: 23.4 % — ABNORMAL LOW (ref 39.0–52.0)
Hemoglobin: 7.3 g/dL — ABNORMAL LOW (ref 13.0–17.0)
MCH: 29.1 pg (ref 26.0–34.0)
MCHC: 31.2 g/dL (ref 30.0–36.0)
MCV: 93.2 fL (ref 80.0–100.0)
Platelets: 211 K/uL (ref 150–400)
RBC: 2.51 MIL/uL — ABNORMAL LOW (ref 4.22–5.81)
RDW: 17.3 % — ABNORMAL HIGH (ref 11.5–15.5)
WBC: 15.9 K/uL — ABNORMAL HIGH (ref 4.0–10.5)
nRBC: 0 % (ref 0.0–0.2)

## 2024-02-21 LAB — PROCALCITONIN: Procalcitonin: 0.35 ng/mL

## 2024-02-21 LAB — TROPONIN T, HIGH SENSITIVITY: Troponin T High Sensitivity: 58 ng/L — ABNORMAL HIGH (ref 0–19)

## 2024-02-21 MED ORDER — ONDANSETRON HCL 4 MG PO TABS: 4.0000 mg | ORAL_TABLET | Freq: Four times a day (QID) | ORAL | Status: DC | PRN

## 2024-02-21 MED ORDER — ONDANSETRON HCL 4 MG/2ML IJ SOLN: 4.0000 mg | Freq: Four times a day (QID) | INTRAMUSCULAR | Status: DC | PRN

## 2024-02-21 MED ORDER — VANCOMYCIN HCL IN DEXTROSE 1-5 GM/200ML-% IV SOLN
1000.0000 mg | INTRAVENOUS | Status: DC
  Administered 2024-02-21: 1000 mg via INTRAVENOUS

## 2024-02-21 MED ORDER — ALPRAZOLAM 0.5 MG PO TABS
0.5000 mg | ORAL_TABLET | Freq: Three times a day (TID) | ORAL | Status: DC
  Administered 2024-02-21 – 2024-02-28 (×22): 0.5 mg via ORAL
  Filled 2024-02-21 (×6): qty 1
  Filled 2024-02-21: qty 2
  Filled 2024-02-21 (×3): qty 1
  Filled 2024-02-21: qty 2
  Filled 2024-02-21 (×11): qty 1

## 2024-02-21 MED ORDER — ALPRAZOLAM 0.25 MG PO TABS: 1.0000 mg | ORAL_TABLET | Freq: Three times a day (TID) | ORAL | Status: DC

## 2024-02-21 MED ORDER — MENTHOL 3 MG MT LOZG
1.0000 | LOZENGE | OROMUCOSAL | Status: DC | PRN
  Administered 2024-02-21: 3 mg via ORAL
  Filled 2024-02-21: qty 9

## 2024-02-21 MED ORDER — PANTOPRAZOLE SODIUM 40 MG PO TBEC
40.0000 mg | DELAYED_RELEASE_TABLET | Freq: Every day | ORAL | Status: DC
  Administered 2024-02-22 – 2024-02-27 (×5): 40 mg via ORAL
  Filled 2024-02-21 (×6): qty 1

## 2024-02-21 MED ORDER — SODIUM CHLORIDE 0.9 % IV SOLN
2.0000 g | Freq: Two times a day (BID) | INTRAVENOUS | Status: DC
  Administered 2024-02-21 – 2024-02-22 (×3): 2 g via INTRAVENOUS
  Filled 2024-02-21 (×3): qty 12.5

## 2024-02-21 MED ORDER — IOHEXOL 350 MG/ML SOLN
75.0000 mL | Freq: Once | INTRAVENOUS | Status: AC | PRN
  Administered 2024-02-21: 75 mL via INTRAVENOUS

## 2024-02-21 MED ORDER — ALBUTEROL SULFATE (2.5 MG/3ML) 0.083% IN NEBU
2.5000 mg | INHALATION_SOLUTION | RESPIRATORY_TRACT | Status: DC | PRN
  Administered 2024-02-26: 2.5 mg via RESPIRATORY_TRACT
  Filled 2024-02-21: qty 3

## 2024-02-21 MED ORDER — METRONIDAZOLE 500 MG/100ML IV SOLN
500.0000 mg | Freq: Two times a day (BID) | INTRAVENOUS | Status: DC
  Administered 2024-02-21 – 2024-02-23 (×5): 500 mg via INTRAVENOUS
  Filled 2024-02-21 (×5): qty 100

## 2024-02-21 MED ORDER — FERROUS SULFATE 325 (65 FE) MG PO TABS
325.0000 mg | ORAL_TABLET | Freq: Every day | ORAL | Status: DC
  Administered 2024-02-22 – 2024-02-27 (×5): 325 mg via ORAL
  Filled 2024-02-21 (×5): qty 1

## 2024-02-21 MED ORDER — SODIUM CHLORIDE 0.9 % IV SOLN
500.0000 mg | Freq: Once | INTRAVENOUS | Status: AC
  Administered 2024-02-21: 500 mg via INTRAVENOUS
  Filled 2024-02-21: qty 5

## 2024-02-21 MED ORDER — IPRATROPIUM-ALBUTEROL 0.5-2.5 (3) MG/3ML IN SOLN
3.0000 mL | RESPIRATORY_TRACT | Status: DC | PRN
  Administered 2024-02-21 – 2024-03-07 (×20): 3 mL via RESPIRATORY_TRACT
  Filled 2024-02-21 (×9): qty 3
  Filled 2024-02-21: qty 9

## 2024-02-21 MED ORDER — ACETAMINOPHEN 650 MG RE SUPP: 650.0000 mg | Freq: Four times a day (QID) | RECTAL | Status: DC | PRN

## 2024-02-21 MED ORDER — LACTATED RINGERS IV BOLUS (SEPSIS)
500.0000 mL | Freq: Once | INTRAVENOUS | Status: AC
  Administered 2024-02-21: 500 mL via INTRAVENOUS

## 2024-02-21 MED ORDER — ACETAMINOPHEN 325 MG PO TABS
650.0000 mg | ORAL_TABLET | Freq: Four times a day (QID) | ORAL | Status: DC | PRN
  Administered 2024-02-22 (×2): 650 mg via ORAL
  Filled 2024-02-21 (×4): qty 2

## 2024-02-21 MED ORDER — EMPAGLIFLOZIN 10 MG PO TABS
10.0000 mg | ORAL_TABLET | Freq: Every day | ORAL | Status: DC
  Filled 2024-02-21: qty 1

## 2024-02-21 MED ORDER — MEXILETINE HCL 200 MG PO CAPS
200.0000 mg | ORAL_CAPSULE | Freq: Two times a day (BID) | ORAL | Status: DC
  Administered 2024-02-22 – 2024-02-27 (×12): 200 mg via ORAL
  Filled 2024-02-21 (×15): qty 1

## 2024-02-21 MED ORDER — MAGIC MOUTHWASH W/LIDOCAINE
10.0000 mL | Freq: Four times a day (QID) | ORAL | Status: DC | PRN
  Administered 2024-02-24: 10 mL via ORAL
  Filled 2024-02-21 (×2): qty 10

## 2024-02-21 MED ORDER — UMECLIDINIUM BROMIDE 62.5 MCG/ACT IN AEPB
1.0000 | INHALATION_SPRAY | Freq: Every day | RESPIRATORY_TRACT | Status: DC
  Administered 2024-02-22 – 2024-02-26 (×5): 1 via RESPIRATORY_TRACT
  Filled 2024-02-21 (×2): qty 7

## 2024-02-21 MED ORDER — BEMPEDOIC ACID-EZETIMIBE 180-10 MG PO TABS: 1.0000 | ORAL_TABLET | Freq: Every day | ORAL | Status: DC

## 2024-02-21 MED ORDER — ARFORMOTEROL TARTRATE 15 MCG/2ML IN NEBU
15.0000 ug | INHALATION_SOLUTION | Freq: Two times a day (BID) | RESPIRATORY_TRACT | Status: DC
  Administered 2024-02-21 – 2024-02-27 (×12): 15 ug via RESPIRATORY_TRACT
  Filled 2024-02-21 (×13): qty 2

## 2024-02-21 MED ORDER — HEPARIN SODIUM (PORCINE) 5000 UNIT/ML IJ SOLN: 5000.0000 [IU] | Freq: Three times a day (TID) | INTRAMUSCULAR | Status: DC

## 2024-02-21 MED ORDER — ASPIRIN 81 MG PO TBEC: 81.0000 mg | DELAYED_RELEASE_TABLET | Freq: Every day | ORAL | Status: DC

## 2024-02-21 MED ORDER — CLOTRIMAZOLE 10 MG MT TROC
10.0000 mg | Freq: Every day | OROMUCOSAL | Status: DC
  Administered 2024-02-22 – 2024-02-29 (×30): 10 mg via ORAL
  Filled 2024-02-21 (×40): qty 1

## 2024-02-21 NOTE — Progress Notes (Signed)
" ° ° °  EXPEDITER LEVEL LOADING ASSESSMENT NOTE  Patient Name: William Elliott  DOB:June 13, 1940 Date of Admission: 02/20/2024  Date of Assessment:02/21/2024   -------------------------------------------------------------------------------------------------------------------   Brief clinical summary: 83 yo male with PMH CAD, CABG, GI bleeding, A-fib, HTN, HLD, CVA, admitted for Multifocal PNA.   Is there Bed Availability at another [facility]? Yes  If yes, what facility: [facility]  Level of Care Needed:  Yes  MD Agree to transfer: Yes  Patient agrees to transfer: Yes    -------------------------------------------------------------------------------------------------------------------  [facility] RN Expediter, Sharolyn JONETTA Batman Please contact us  directly via secure chat (search for [facility]) or by calling us  at [phone] [facility]). "

## 2024-02-21 NOTE — Plan of Care (Signed)

## 2024-02-21 NOTE — ED Notes (Signed)
 Patient resting in position of comfort with son at bedside in recliner.

## 2024-02-21 NOTE — ED Notes (Signed)
 Speech therapy at bedside for swallow eval. Asked to hold off on po meds until after.

## 2024-02-21 NOTE — ED Notes (Signed)
 Son requested breathing treatment for patient reporting significant breathing difficulty without it.

## 2024-02-21 NOTE — ED Notes (Signed)
 Son William Elliott at bedside today will be leaving but Elspeth his brother will be coming to stay with patient.

## 2024-02-21 NOTE — Progress Notes (Signed)
 Pharmacy Antibiotic Note  William Elliott is a 83 y.o. male admitted on 02/20/2024 with SOB.  Pharmacy has been consulted for cefepime /vancomycin  dosing for pneumonia. Previously treated for CAP at last admission for 5 days (d/c'd 02/19/2024).  -WBC 13, sCr 1.4 (bl~1.2-1.3), afebrile -Flu/COVID negative, 12/16 MRSA PCR negative -Blood cultures collected -CTchest bibasilar infiltrates - pneumonia  Plan: -Cefepime  2g IV every 12 hours -Flagyl  500mg  IV every 12 hours -Vancomycin  1750mg  IV x1 -Vancomycin  1000mg  IV every 24 hours (AUC 502, Vd 0.72, TBW, sCr 1.4) -Monitor renal function -Follow up signs of clinical improvement, LOT, de-escalation of antibiotics   Height: 5' 10 (177.8 cm) Weight: 71.6 kg (157 lb 13.6 oz) IBW/kg (Calculated) : 73  Temp (24hrs), Avg:98 F (36.7 C), Min:97.8 F (36.6 C), Max:98.2 F (36.8 C)  Recent Labs  Lab 02/15/24 0210 02/15/24 0611 02/16/24 1819 02/17/24 0444 02/18/24 0608 02/19/24 0450 02/20/24 2258 02/20/24 2316 02/20/24 2317  WBC  --    < > 36.4* 20.8* 12.1* 11.2* 13.3*  --   --   CREATININE  --    < >  --  2.13* 1.43* 1.03 1.24 1.40*  --   LATICACIDVEN 1.7  --   --   --   --   --   --   --  1.0   < > = values in this interval not displayed.    Estimated Creatinine Clearance: 41.2 mL/min (A) (by C-G formula based on SCr of 1.4 mg/dL (H)).    Allergies[1]  Antimicrobials this admission: Cefepime  12/21 >>  Flagyl  12/21 >>  Vancomycin  12/21 >>  Microbiology results: 12/21 BCx:  12/16 MRSA PCR: negative  Thank you for allowing pharmacy to be a part of this patients care.  Lynwood Poplar, PharmD, BCPS Clinical Pharmacist 02/21/2024 4:59 AM       [1]  Allergies Allergen Reactions   Anoro Ellipta  [Umeclidinium-Vilanterol] Other (See Comments)    Cardiac problems   Sulfites Shortness Of Breath and Anxiety   Lipitor [Atorvastatin Calcium ] Other (See Comments)    Unknown reaction   Niaspan [Niacin] Other (See Comments)     Unknown reaction   Tricor [Fenofibrate] Other (See Comments)    Fatigue    Zocor [Simvastatin] Other (See Comments)    Unknown reaction

## 2024-02-21 NOTE — ED Notes (Signed)
 Attempted condom cath twice but bout slide off after awhile. Place a male pur wick on the patient and got bedding changed.

## 2024-02-21 NOTE — ED Notes (Signed)
 Called [facility] for pt pick up

## 2024-02-21 NOTE — Evaluation (Signed)
 Clinical/Bedside Swallow Evaluation Patient Details  Name: William Elliott MRN: [id number] Date of Birth: 08-05-1940  Today's Date: 02/21/2024 Time: SLP Start Time (ACUTE ONLY): 1015 SLP Stop Time (ACUTE ONLY): 1101 SLP Time Calculation (min) (ACUTE ONLY): 46 min  Past Medical History:  Past Medical History:  Diagnosis Date   Acute lower GI bleeding 2008   Anxiety    Asthma    Carotid artery disease    Asymptomatic left carotid bruit   COPD (chronic obstructive pulmonary disease) (HCC)    Coronary artery disease    a. CABG - 1999 with LIMA-LAD, SVG-DIAG, SVG-OM, SVG-RPDA  b. Cath in setting of NSTEMI 03/18/2015: patent LIMA-LAD, occluded SVG-RCA, occluded SVG-D1, 99% stenosis of SVG-OM treated w/ DES   Depression    Dyslipidemia    Erectile dysfunction    Essential hypertension    History of blood transfusion 2008   History of stroke    Hyperlipidemia    Myocardial infarction [facility])    Past Surgical History:  Past Surgical History:  Procedure Laterality Date   CARDIAC CATHETERIZATION     CARDIAC CATHETERIZATION N/A 03/18/2015   Procedure: Left Heart Cath and Cors/Grafts Angiography;  Surgeon: Alm LELON Clay, MD;  Location: [facility] INVASIVE CV LAB;  Service: Cardiovascular;  Laterality: N/A;   CARDIAC CATHETERIZATION N/A 03/18/2015   Procedure: Coronary Stent Intervention;  Surgeon: Alm LELON Clay, MD;  Location: [facility] INVASIVE CV LAB;  Service: Cardiovascular;  Laterality: N/A;   CATARACT EXTRACTION W/ INTRAOCULAR LENS  IMPLANT, BILATERAL Bilateral    COLONOSCOPY N/A 02/16/2024   Procedure: COLONOSCOPY;  Surgeon: Albertus Gordy HERO, MD;  Location: [facility] ENDOSCOPY;  Service: Gastroenterology;  Laterality: N/A;   CORONARY ANGIOPLASTY     CORONARY ARTERY BYPASS GRAFT  1999   CORONARY STENT INTERVENTION N/A 04/22/2020   Procedure: CORONARY STENT INTERVENTION;  Surgeon: Claudene Victory LELON, MD;  Location: [facility] INVASIVE CV LAB;  Service: Cardiovascular;  Laterality: N/A;   ESOPHAGOGASTRODUODENOSCOPY N/A  02/16/2024   Procedure: EGD (ESOPHAGOGASTRODUODENOSCOPY);  Surgeon: Albertus Gordy HERO, MD;  Location: [facility] ENDOSCOPY;  Service: Gastroenterology;  Laterality: N/A;   FRACTURE SURGERY     HERNIA REPAIR     HOT HEMOSTASIS N/A 02/16/2024   Procedure: COLONOSCOPY, WITH ARGON PLASMA COAGULATION;  Surgeon: Albertus Gordy HERO, MD;  Location: [facility] ENDOSCOPY;  Service: Gastroenterology;  Laterality: N/A;   LEFT HEART CATH AND CORS/GRAFTS ANGIOGRAPHY N/A 04/22/2020   Procedure: LEFT HEART CATH AND CORS/GRAFTS ANGIOGRAPHY;  Surgeon: Claudene Victory LELON, MD;  Location: [facility] INVASIVE CV LAB;  Service: Cardiovascular;  Laterality: N/A;   TIBIA FRACTURE SURGERY Left [facility]; took bone out of my right hip   TONSILLECTOMY     UMBILICAL HERNIA REPAIR  ~ 2000   HPI:  William Elliott is an 83 yo male with recent admission 12/15-12/20 for acute GIB complicated by ABLA with hemorrhagic shock. Pt was also dx acute on chronic CHF exacerbation and PNA. 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 Pharyngeal residue and larygneal penetration were noted across consistencies, as well as aspiration of liquids. Pt returned to the hospital on 12/21 due to acute shortness of breath and fever. Pt admitted with concern for PNA and also started on tx for thrush. PMH also includes:  CAD s/p CABG, chronic A-fib (not on anticoagulation d/t previous GIB), HTN, HLD, prior CVA (2017), COPD    Assessment / Plan / Recommendation  Clinical Impression  Pt is suspected to have an acute on chronic  dysphagia in the setting of acute deconditioning. A lot of time was spent on education with pt and his son regarding results of MBS on 12/19, including chronic, structural component from suspected osteophytes. Pt is also currently presenting with additional factors that increase his risk for adverse events in the setting of aspiration. Pt and son are in agreement with starting with limited ice chips after oral care, trying  to use strategies previously recommended, which includes coughing and re-swallowing. Will f/u to try to optimize swallowing function while inpatient, although note that ongoing PO intake, even at pt's best, is likely to carry risk.   [name] describes a significant decline in overall function upon return home from the hospital, with pt only lying flat, not being able to lift his head up, or participate in self-care tasks. His son thinks that during his admission, the pt was utilizing swallowing strategies consistently, but that this stopped as soon as he returned home. Pt has also had a decline in his overall respiratory status in light of acute infection. He has facial grimacing every time he swallows, regardless of consistency, and has c/o odynophagia. Note that he is being tx for thrush. Outwardly, signs of dysphagia appear to be fairly similar to recent evaluation, including multiple swallows, wet vocal quality, and intermittent coughing.  SLP Visit Diagnosis: Dysphagia, oropharyngeal phase (R13.12);Dysphagia, pharyngoesophageal phase (R13.14)    Other Recommendations Caregiver Recommendations: Have oral suction available     Swallow Evaluation Recommendations Recommendations: NPO except meds;Ice chips PRN after oral care Medication Administration: Crushed with puree Oral care recommendations: Oral care QID (4x/day);Oral care before ice chips/water;Staff/trained caregiver to provide oral care Caregiver Recommendations: Have oral suction available   Assistance Recommended at Discharge    Functional Status Assessment Patient has had a recent decline in their functional status and demonstrates the ability to make significant improvements in function in a reasonable and predictable amount of time.  Frequency and Duration min 2x/week  2 weeks       Prognosis Prognosis for improved oropharyngeal function: Good Barriers to Reach Goals: Time post onset;Other (Comment) (structural nature of  dysphagia)      Swallow Study   General HPI: Mr. Judice is an 83 yo male with recent admission 12/15-12/20 for acute GIB complicated by ABLA with hemorrhagic shock. Pt was also dx acute on chronic CHF exacerbation and PNA. 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 Pharyngeal residue and larygneal penetration were noted across consistencies, as well as aspiration of liquids. Pt returned to the hospital on 12/21 due to acute shortness of breath and fever. Pt admitted with concern for PNA and also started on tx for thrush. PMH also includes:  CAD s/p CABG, chronic A-fib (not on anticoagulation d/t previous GIB), HTN, HLD, prior CVA (2017), COPD Type of Study: Bedside Swallow Evaluation Previous Swallow Assessment: see HPI Diet Prior to this Study: NPO Temperature Spikes Noted: No History of Recent Intubation: No Behavior/Cognition: Cooperative;Alert;Other (Comment) (HOH) Oral Cavity Assessment: Dry;Erythema Oral Care Completed by SLP: Recent completion by staff Oral Cavity - Dentition: Adequate natural dentition Vision: Functional for self-feeding Self-Feeding Abilities: Total assist Patient Positioning: Upright in bed Baseline Vocal Quality: Normal Volitional Cough: Weak Volitional Swallow: Able to elicit    Oral/Motor/Sensory Function Overall Oral Motor/Sensory Function: Generalized oral weakness   Ice Chips Ice chips: Impaired Presentation: Spoon Pharyngeal Phase Impairments: Multiple swallows;Wet Vocal Quality   Thin Liquid Thin Liquid: Impaired Presentation: Straw Pharyngeal  Phase Impairments: Multiple swallows;Wet Vocal  Quality;Cough - Immediate    Nectar Thick Nectar Thick Liquid: Not tested   Honey Thick Honey Thick Liquid: Not tested   Puree Puree: Impaired Presentation: Spoon Pharyngeal Phase Impairments: Multiple swallows;Cough - Delayed   Solid     Solid: Not tested      Leita SAILOR., M.A. CCC-SLP Acute Rehabilitation  Services Office: [phone]  Secure chat preferred  02/21/2024,12:59 PM

## 2024-02-21 NOTE — ED Notes (Signed)
 Admitting MD Briana messaged regarding diet and oral meds. He was made aware that patient son is requesting supplemental nutrition. Also, this paramedic asked if they want to order any of these meds through IV since speech therapist recommended patient remain npo.

## 2024-02-21 NOTE — ED Notes (Signed)
 Patient transported to CT

## 2024-02-21 NOTE — ED Notes (Addendum)
 Patient resting in position of comfort easily aroused from sleep for neb treatment. Patient without noted distress at this time. Completed without difficulty or changes in wellbeing. CA & O at his baseline with son in recliner at bedside.

## 2024-02-21 NOTE — H&P (Addendum)
 " History and Physical    William Elliott FMW:[id number] DOB: 1940-04-04 DOA: 02/20/2024  PCP: [name], MD  Patient coming from: HOME  I have personally briefly reviewed patient's old medical records in [facility]  Chief Complaint: acute sob  HPI: William Elliott is a 83 y.o. male with medical history significant of CAD status post DES and CABG, chronic atrial fibrillation, hypertension, hyperlipidemia, heart failure with reduced EF, GI bleeding, CVA who was recent admission 02/14/24-02/19/24 at which time he was diagnosed with Acute GI bleed complicated by acute blood loss anemia with hemorrhagic shock.Patient on gi evaluation was found to have AVM tx with APC as well as severe diverticulosis. Patient course further complicated by fluid overload s/p ivfs/PRBC for resuscitation. Patient diagnosed with acute on chronic exacerbation of chf and was treated with course of lasix  with improvement.  Patient however was also found to have Pneumonia with  concern for aspiration and was treated with ctx x 5 days.  Patient stabilized and discharged home less than 24 hours ago.  However, at home patient developed acute sob and fever and EMS was called. IN the field patient was found to have sat 80% on RA. Patient en route give douneb and placed on 2L of  100%. Patent notes no chest pain ,but son who aides in history notes patient is very weak not able to help with ADLS a significant difference from when he was discharged. Patient also has complaints of sore mouth and through. Per son has not been able to eat well.   ED Course:   Tmx 101.6, bp 118/53, cbg164, bp 118/53, sat 89%  Wbc 13.3 (11) , hgb 8.2 (8.5), plt 242,  left shift  BNP 7935  Na 137, K 4.2, Cl 103 Bicarb 25, glu 121, cr 1.4, Alphos 85 CE 72,58  Mag 2.5 RVP: neg EKG afib @78  nonspecific st changes  CTPE MPRESSION: No evidence of pulmonary emboli.   Bibasilar infiltrates consistent with multifocal pneumonia.  Tx  cefepime ,metronidazole  ,vanc Review of Systems: As per HPI otherwise 10 point review of systems negative.   Past Medical History:  Diagnosis Date   Acute lower GI bleeding 2008   Anxiety    Asthma    Carotid artery disease    Asymptomatic left carotid bruit   COPD (chronic obstructive pulmonary disease) (HCC)    Coronary artery disease    a. CABG - 1999 with LIMA-LAD, SVG-DIAG, SVG-OM, SVG-RPDA  b. Cath in setting of NSTEMI 03/18/2015: patent LIMA-LAD, occluded SVG-RCA, occluded SVG-D1, 99% stenosis of SVG-OM treated w/ DES   Depression    Dyslipidemia    Erectile dysfunction    Essential hypertension    History of blood transfusion 2008   History of stroke    Hyperlipidemia    Myocardial infarction [facility])     Past Surgical History:  Procedure Laterality Date   CARDIAC CATHETERIZATION     CARDIAC CATHETERIZATION N/A 03/18/2015   Procedure: Left Heart Cath and Cors/Grafts Angiography;  Surgeon: Alm LELON Clay, MD;  Location: [facility] INVASIVE CV LAB;  Service: Cardiovascular;  Laterality: N/A;   CARDIAC CATHETERIZATION N/A 03/18/2015   Procedure: Coronary Stent Intervention;  Surgeon: Alm LELON Clay, MD;  Location: [facility] INVASIVE CV LAB;  Service: Cardiovascular;  Laterality: N/A;   CATARACT EXTRACTION W/ INTRAOCULAR LENS  IMPLANT, BILATERAL Bilateral    COLONOSCOPY N/A 02/16/2024   Procedure: COLONOSCOPY;  Surgeon: Albertus Gordy HERO, MD;  Location: [facility] ENDOSCOPY;  Service: Gastroenterology;  Laterality: N/A;  CORONARY ANGIOPLASTY     CORONARY ARTERY BYPASS GRAFT  1999   CORONARY STENT INTERVENTION N/A 04/22/2020   Procedure: CORONARY STENT INTERVENTION;  Surgeon: Claudene Victory ORN, MD;  Location: [facility] INVASIVE CV LAB;  Service: Cardiovascular;  Laterality: N/A;   ESOPHAGOGASTRODUODENOSCOPY N/A 02/16/2024   Procedure: EGD (ESOPHAGOGASTRODUODENOSCOPY);  Surgeon: Albertus Gordy HERO, MD;  Location: [facility] ENDOSCOPY;  Service: Gastroenterology;  Laterality: N/A;   FRACTURE SURGERY     HERNIA REPAIR     HOT  HEMOSTASIS N/A 02/16/2024   Procedure: COLONOSCOPY, WITH ARGON PLASMA COAGULATION;  Surgeon: Albertus Gordy HERO, MD;  Location: [facility] ENDOSCOPY;  Service: Gastroenterology;  Laterality: N/A;   LEFT HEART CATH AND CORS/GRAFTS ANGIOGRAPHY N/A 04/22/2020   Procedure: LEFT HEART CATH AND CORS/GRAFTS ANGIOGRAPHY;  Surgeon: Claudene Victory ORN, MD;  Location: [facility] INVASIVE CV LAB;  Service: Cardiovascular;  Laterality: N/A;   TIBIA FRACTURE SURGERY Left [facility]; took bone out of my right hip   TONSILLECTOMY     UMBILICAL HERNIA REPAIR  ~ 2000     reports that he quit smoking about 26 years ago. His smoking use included cigars. He has never been exposed to tobacco smoke. He has never used smokeless tobacco. He reports that he does not drink alcohol and does not use drugs.  Allergies[1]  Family History  Problem Relation Age of Onset   Heart disease Father    Heart attack Father    Hypertension Mother    Stroke Mother     Prior to Admission medications  Medication Sig Start Date End Date Taking? Authorizing Provider  acetaminophen  (TYLENOL ) 500 MG tablet Take 1,000 mg by mouth 2 (two) times daily as needed for moderate pain (pain score 4-6), fever or headache.   Yes [provider]  ALPRAZolam  (XANAX ) 1 MG tablet Take 1 tablet (1 mg total) by mouth at bedtime as needed for anxiety. Patient taking differently: Take 1 mg by mouth every 6 (six) hours. 11/01/16  Yes Triplett, Tammy, PA-C  Ascorbic Acid  (VITAMIN C  PO) Take 1 tablet by mouth daily with breakfast.   Yes [provider]  aspirin  EC 81 MG tablet Take 1 tablet (81 mg total) by mouth daily. 02/12/17  Yes Debera Jayson MATSU, MD  B Complex-C (SUPER B COMPLEX PO) Take 1 capsule by mouth daily with breakfast.   Yes [provider]  CHELATED ZINC  PO Take 1 tablet by mouth daily with breakfast.   Yes [provider]  empagliflozin  (JARDIANCE ) 10 MG TABS tablet Take 1 tablet (10 mg total) by mouth daily. 10/07/23   Yes Hayes Beckey CROME, NP  Ferrous Sulfate  (IRON PO) Take 1 tablet by mouth daily with breakfast.   Yes [provider]  furosemide  (LASIX ) 20 MG tablet Take 4 tablets (80 mg total) by mouth daily. Patient taking differently: Take 40 mg by mouth See admin instructions. Take 1 tablet by mouth up to four times a day 11/12/23  Yes Lee, Jordan, NP  ipratropium-albuterol  (DUONEB) 0.5-2.5 (3) MG/3ML SOLN Take 3 mLs by nebulization every 4 (four) hours as needed (wheezing, shortness of breath). 03/11/23  Yes [provider]  mexiletine (MEXITIL ) 200 MG capsule TAKE ONE CAPSULE BY MOUTH TWICE DAILY 02/04/24  Yes Marcine Catalan M, PA-C  Multiple Vitamins-Minerals (MENS 50+ MULTIVITAMIN) TABS Take 1 tablet by mouth daily with breakfast.   Yes [provider]  NEXLIZET 180-10 MG TABS Take 1 tablet by mouth daily. 07/23/22  Yes [provider]  nystatin (MYCOSTATIN/NYSTOP) powder Apply 1 Application topically daily as needed (for dryness). 02/02/24  Yes [provider]  pantoprazole  (PROTONIX ) 40 MG tablet TAKE 1 TABLET BY MOUTH EVERY DAY 02/09/19  Yes Debera Jayson MATSU, MD  Tiotropium Bromide -Olodaterol (STIOLTO RESPIMAT ) 2.5-2.5 MCG/ACT AERS Inhale 2 puffs into the lungs daily.   Yes [provider]  albuterol  (VENTOLIN  HFA) 108 (90 Base) MCG/ACT inhaler Inhale 2 puffs into the lungs every 6 (six) hours as needed for wheezing or shortness of breath.    [provider]  [Paused] amLODipine  (NORVASC ) 5 MG tablet Take 5 mg by mouth daily. Wait to take this until your doctor or other care provider tells you to start again. 02/02/24   [provider]  [Paused] isosorbide  mononitrate (IMDUR ) 30 MG 24 hr tablet Take 0.5 tablets (15 mg total) by mouth at bedtime. Wait to take this until your doctor or other care provider tells you to start again. 10/07/23   Hayes Beckey CROME, NP  [Paused] losartan  (COZAAR ) 25 MG tablet Take 0.5 tablets (12.5 mg total) by mouth  daily. Wait to take this until your doctor or other care provider tells you to start again. 11/12/23   Lee, Jordan, NP  nitroGLYCERIN  (NITROSTAT ) 0.4 MG SL tablet DISSOLVE 1 TABLET UNDER THE TONGUE EVERY 5 MINUTES AS NEEDED FOR CHEST PAIN. DO NOT EXCEED 3 DOSES IN 15 MIN. CALL 911 IF NO RELIEF 04/19/23   Debera Jayson MATSU, MD  Omega-3 Fatty Acids (FISH OIL PO) Take 1 capsule by mouth daily with breakfast. Patient not taking: Reported on 02/21/2024    [provider]  potassium chloride  SA (KLOR-CON  M) 20 MEQ tablet Take 20 mEq by mouth daily. Take with Lasix  Patient not taking: Reported on 02/21/2024    [provider]  [Paused] spironolactone  (ALDACTONE ) 25 MG tablet Take 1 tablet (25 mg total) by mouth daily. Wait to take this until your doctor or other care provider tells you to start again. 10/14/23   Glena Harlene HERO, FNP    Physical Exam: Vitals:   02/21/24 0230 02/21/24 0300 02/21/24 0330 02/21/24 0345  BP: (!) 104/52 111/77 111/62 105/64  Pulse: 61 69 66   Resp: (!) 23 (!) 23 (!) 23 (!) 25  Temp:      SpO2: 95% 98% 98% 97%  Weight:      Height:        Constitutional: NAD, calm, comfortable Vitals:   02/21/24 0230 02/21/24 0300 02/21/24 0330 02/21/24 0345  BP: (!) 104/52 111/77 111/62 105/64  Pulse: 61 69 66   Resp: (!) 23 (!) 23 (!) 23 (!) 25  Temp:      SpO2: 95% 98% 98% 97%  Weight:      Height:       Eyes: , lids and conjunctivae normal ENMT: Mucous membranes are moist. -evidence of thrush, Neck: normal, supple, no masses, no thyromegaly Respiratory: +crackles/+rhonchi. Normal respiratory effort. No accessory muscle use.  Cardiovascular: Regular rate and rhythm, no murmurs / rubs / gallops.trace extremity edema. Abdomen: no tenderness, no masses palpated. No hepatosplenomegaly. Bowel sounds positive.  Musculoskeletal: no clubbing / cyanosis. No joint deformity upper and lower extremities. Good ROM, no contractures. Normal muscle tone.  Skin: no  rashes, lesions, ulcers. No induration Neurologic: CN 2-12 grossly intact. Sensation intact,  MAE X4    Psychiatric: Normal judgment and insight. Alert and oriented x 3. Normal mood.    Labs on Admission: I have personally reviewed following labs and imaging studies  CBC: Recent Labs  Lab 02/14/24 2348 02/14/24 2355 02/16/24 1819 02/17/24 0444 02/18/24 0608 02/19/24 0450 02/20/24 2258 02/20/24 2316  WBC 20.4*   < > 36.4* 20.8* 12.1* 11.2* 13.3*  --   NEUTROABS 18.6*  --   --  18.7*  --   --  11.5*  --   HGB 8.3*   < > 8.0* 7.5* 8.0* 7.9* 8.2* 8.8*  8.5*  HCT 27.0*   < > 23.3* 21.1* 23.3* 23.4* 25.9* 26.0*  25.0*  MCV 88.2   < > 84.7 85.4 85.7 87.3 93.8  --   PLT 308   < > 241 155 133* 145* 242  --    < > = values in this interval not displayed.   Basic Metabolic Panel: Recent Labs  Lab 02/15/24 0611 02/16/24 0234 02/17/24 0444 02/18/24 0608 02/19/24 0450 02/20/24 2258 02/20/24 2316  NA 140 137 133* 134* 136 137 140  139  K 4.9 4.1 3.6 3.2* 3.4* 4.2 4.1  4.1  CL 105 103 101 101 103 103 102  CO2 22 20* 22 24 25 25   --   GLUCOSE 126* 158* 139* 111* 109* 121* 120*  BUN 28* 41* 47* 39* 33* 30* 27*  CREATININE 1.34* 2.13* 2.13* 1.43* 1.03 1.24 1.40*  CALCIUM  8.4* 8.4* 8.0* 8.0* 8.1* 8.7*  --   MG 2.1  --   --  2.2  --  2.5*  --    GFR: Estimated Creatinine Clearance: 41.2 mL/min (A) (by C-G formula based on SCr of 1.4 mg/dL (H)). Liver Function Tests: Recent Labs  Lab 02/14/24 2348 02/20/24 2258  AST 35 33  ALT 15 24  ALKPHOS 56 85  BILITOT 1.3* 0.7  PROT 5.3* 5.7*  ALBUMIN 2.8* 3.2*   No results for input(s): LIPASE, AMYLASE in the last 168 hours. No results for input(s): AMMONIA in the last 168 hours. Coagulation Profile: Recent Labs  Lab 02/15/24 0155 02/16/24 0234 02/20/24 2258  INR 1.3* 1.4* 1.2   Cardiac Enzymes: No results for input(s): CKTOTAL, CKMB, CKMBINDEX, TROPONINI in the last 168 hours. BNP (last 3 results) Recent  Labs    02/20/24 2258  PROBNP 7,935.0*   HbA1C: No results for input(s): HGBA1C in the last 72 hours. CBG: Recent Labs  Lab 02/15/24 0422  GLUCAP 114*   Lipid Profile: No results for input(s): CHOL, HDL, LDLCALC, TRIG, CHOLHDL, LDLDIRECT in the last 72 hours. Thyroid  Function Tests: No results for input(s): TSH, T4TOTAL, FREET4, T3FREE, THYROIDAB in the last 72 hours. Anemia Panel: No results for input(s): VITAMINB12, FOLATE, FERRITIN, TIBC, IRON, RETICCTPCT in the last 72 hours. Urine analysis:    Component Value Date/Time   COLORURINE YELLOW 02/15/2024 0637   APPEARANCEUR CLEAR 02/15/2024 0637   LABSPEC 1.044 (H) 02/15/2024 0637   PHURINE 6.0 02/15/2024 0637   GLUCOSEU >=500 (A) 02/15/2024 0637   HGBUR NEGATIVE 02/15/2024 0637   BILIRUBINUR NEGATIVE 02/15/2024 0637   KETONESUR NEGATIVE 02/15/2024 0637   PROTEINUR NEGATIVE 02/15/2024 0637   NITRITE NEGATIVE 02/15/2024 0637   LEUKOCYTESUR NEGATIVE 02/15/2024 9362    Radiological Exams on Admission: CT Angio Chest PE W and/or Wo Contrast Result Date: 02/21/2024 CLINICAL DATA:  Shortness of breath EXAM: CT ANGIOGRAPHY CHEST WITH CONTRAST TECHNIQUE: Multidetector CT imaging of the chest was performed using the standard protocol during bolus administration of intravenous contrast. Multiplanar CT image reconstructions and MIPs were obtained to evaluate the vascular anatomy. RADIATION DOSE REDUCTION: This exam was performed according to the departmental dose-optimization program  which includes automated exposure control, adjustment of the mA and/or kV according to patient size and/or use of iterative reconstruction technique. CONTRAST:  75mL OMNIPAQUE  IOHEXOL  350 MG/ML SOLN COMPARISON:  Chest x-ray from the previous day FINDINGS: Cardiovascular: Atherosclerotic calcifications of the thoracic aorta are noted. Changes of prior coronary bypass grafting are seen. The pulmonary artery shows a normal  branching pattern bilaterally. No filling defect to suggest pulmonary embolism is noted. Coronary calcifications are noted. Heart is mildly. Mediastinum/Nodes: Esophagus is within normal limits. No hilar or mediastinal adenopathy is noted. The thoracic inlet is unremarkable. Lungs/Pleura: Lungs show patchy bibasilar infiltrate with associated small effusions consistent with multifocal pneumonia. Upper Abdomen: Gallbladder has been surgically removed. Musculoskeletal: No chest wall abnormality. No acute or significant osseous findings. Review of the MIP images confirms the above findings. IMPRESSION: No evidence of pulmonary emboli. Bibasilar infiltrates consistent with multifocal pneumonia. Aortic Atherosclerosis (ICD10-I70.0). Electronically Signed   By: Oneil Devonshire M.D.   On: 02/21/2024 01:59   DG Chest Portable 1 View Result Date: 02/20/2024 CLINICAL DATA:  Shortness of breath EXAM: PORTABLE CHEST 1 VIEW COMPARISON:  02/18/2024, 02/15/2024, 01/20/2023 FINDINGS: Post sternotomy changes. Cardiomegaly with vascular congestion. Patchy bilateral pulmonary opacities either reflecting mild edema or diffuse pneumonia. Suspicion of small pleural effusions. Aortic atherosclerosis. Not much interval change compared with radiograph 2 days prior. IMPRESSION: Cardiomegaly with vascular congestion. Patchy bilateral pulmonary opacities either reflecting mild edema or diffuse pneumonia. Suspicion of small pleural effusions. Electronically Signed   By: Luke Bun M.D.   On: 02/20/2024 23:36    EKG: Independently reviewed. See above  Assessment/Plan  Partially treated CAP with concern for Aspiration  vs HCAP  -with acute hypoxic respiratory failure  - admit to progressive care  - continue on cefepime , metronidazole , vanc  -f/u sputum culture  -pulmonary toilet  -npo due to concern for repeated aspiration  -speech consult   Dysphagia  -npo -f/u with speech prior to restarting diet   Thrush Mucositis  -  start clotrimazole  troche  - mucositis mouth wash   Recent Hx fo GI Bleed -due to AVM/Diverticulosis  -hgb stable at 8.2 will continue to monitor   CAD s/p CABG -note abnormal CE  -presumed due to demand  -will continue to trend  -echo in am  -resume imdur  as bp allows   Atrial fibrillation -not on anticoagulation due to bleed -not on beta blocker due to bradycardia   Hx of PVC  -continue on mexiletine 200 mg bid     CHFref -recent exacerbation , currently noted increase bnp  - patient however w/o overt fluid overload  -resume home regimen as able  -strict I/o daily weights   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     DVT prophylaxis: scd Code Status: full/ as discussed per patient wishes in event of cardiac arrest  Family Communication:   Eben [name] Blades, Emergency Contact [phone] (Mobile)   Disposition Plan: patient  expected to be admitted greater than 2 midnights  Consults called: n/a Admission status: progressive care   Camila DELENA Ned MD [facility]   If 7PM-7AM, please contact night-coverage [email or web address] Password [email or web address]  02/21/2024, 3:55 AM        [1]  Allergies Allergen Reactions   Anoro Ellipta  [Umeclidinium-Vilanterol] Other (See Comments)    Cardiac problems   Sulfites Shortness Of Breath and Anxiety   Lipitor [Atorvastatin Calcium ] Other (See Comments)    Unknown reaction   Niaspan [Niacin] Other (See Comments)    Unknown reaction  Tricor [Fenofibrate] Other (See Comments)    Fatigue    Zocor [Simvastatin] Other (See Comments)    Unknown reaction   "

## 2024-02-21 NOTE — Progress Notes (Addendum)
 "  PROGRESS NOTE    William Elliott  FMW:[id number] DOB: 11/03/1940 DOA: 02/20/2024 PCP: [name], MD   Brief Narrative: William Elliott is a 83 y.o. male with a history of CAD status post DES and CABG, chronic atrial fibrillation, hypertension, hyperlipidemia, heart failure with reduced EF, GI bleeding, CVA .  Patient presented secondary to shortness of breath with CT evidence of multifocal pneumonia. Blood cultures obtained. Empiric antibiotics started.   Assessment/Plan:  Multifocal pneumonia Concern for aspiration pneumonia based on acuity of symptoms and associated dysphagia. Chest CT significant for bibasilar infiltrates. Procalcitonin elevated at 0.35. Currently on room air. -Continue Vancomycin , cefepime , Flagyl  -Follow-up sputum culture, strep pneumoniae and legionella testing -Check respiratory virus panel -Follow-up blood culture data  Chronic HFrEF Patient with elevated proBNP, however CT imaging more consistent with pneumonia. Patient has had some initial improvement without requiring Lasix . Patient required Lasix  for treatment of acute heart failure on last admission -Strict in/out -Daily weights  History of dysphagia Patient was seen by speech therapy last admission with recommendations at that time for a dysphagia 3 diet. Concern current pneumonia may be related to episode of aspiration. -Dysphagia 3 diet  Addendum: Patient seen by speech therapy with recommendation for NPO at this time; allowing ice chips and sips with meds.  Chronic anemia Complicated by recent acute blood loss anemia requiring multiple blood transfusions. Hemoglobin currently stable. Will need an iron panel in the future.  Addendum: Hemoglobin down to 7.3. No evidence of bleeding, but patient with recent GI bleed history. Hemoglobin down to 7.3 from 8.2 Asymptomatic. Will obtain repeat hemoglobin and likely transfuse if remains below 8. If drops further, may need repeat GI consultation,  although last admission, patient was found to have severe diverticulosis (in addition to treated angiodysplastic lesions).  Primary hypertension Prior to arrival medication(s) include amlodipine , losartan , Imdur  and spironolactone .   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  CAD S/p DES and CABG. No chest pain currently.  -Continue aspirin  (will hold now secondary to drop in hemoglobin)   Chronic atrial fibrillation Patient is mexiletine but anticoagulation as an outpatient.  Currently rate controlled. -Continue Mexiletine   CKD stage IIIa Does not appear to have persistent GFR in the 3b range. Baseline creatinine is about 1.3-1.4.  Creatinine of 1.24 on admission. Stable.  COPD -Continue albuterol  -Continue Incruse Ellipta  and Brovana    DVT prophylaxis: SCDs Code Status:   Code Status: Full Code Family Communication: None at bedside Disposition Plan: Discharge pending improvement of hypoxia and transition to outpatient regimen.   Consultants:  None  Procedures:  None  Antimicrobials: Vancomycin  Cefepime  Flagyl     Subjective: Patient reports no dyspnea. He reports some cough with production.  Objective: BP 126/60   Pulse (!) 51   Temp 97.8 F (36.6 C) (Oral)   Resp 16   Ht 5' 10 (1.778 m)   Wt 71.6 kg   SpO2 100%   BMI 22.65 kg/m   Examination:  General exam: Appears calm and comfortable. Respiratory system: L>R rhonchi. Respiratory effort normal. Cardiovascular system: S1 & S2 heard. Irregular rhythm, normal rate. Gastrointestinal system: Abdomen is nondistended, soft and nontender. Normal bowel sounds heard. Central nervous system: Alert and oriented. No focal neurological deficits. Psychiatry: Judgement and insight appear normal. Mood & affect appropriate.    Data Reviewed: I have personally reviewed following labs and imaging studies   Last CBC Lab Results  Component Value Date   WBC 13.3 (H)  02/20/2024    HGB 8.8 (L) 02/20/2024   HGB 8.5 (L) 02/20/2024   HCT 26.0 (L) 02/20/2024   HCT 25.0 (L) 02/20/2024   MCV 93.8 02/20/2024   MCH 29.7 02/20/2024   RDW 17.3 (H) 02/20/2024   PLT 242 02/20/2024     Last metabolic panel Lab Results  Component Value Date   GLUCOSE 120 (H) 02/20/2024   NA 140 02/20/2024   NA 139 02/20/2024   K 4.1 02/20/2024   K 4.1 02/20/2024   CL 102 02/20/2024   CO2 25 02/20/2024   BUN 27 (H) 02/20/2024   CREATININE 1.40 (H) 02/20/2024   GFRNONAA 58 (L) 02/20/2024   CALCIUM  8.7 (L) 02/20/2024   PHOS 2.9 01/21/2023   PROT 5.7 (L) 02/20/2024   ALBUMIN 3.2 (L) 02/20/2024   BILITOT 0.7 02/20/2024   ALKPHOS 85 02/20/2024   AST 33 02/20/2024   ALT 24 02/20/2024   ANIONGAP 9 02/20/2024     Creatinine Clearance: Estimated Creatinine Clearance: 41.2 mL/min (A) (by C-G formula based on SCr of 1.4 mg/dL (H)).  Recent Results (from the past 240 hours)  Blood Culture (routine x 2)     Status: None   Collection Time: 02/15/24  1:55 AM   Specimen: BLOOD  Result Value Ref Range Status   Specimen Description BLOOD RIGHT ANTECUBITAL  Final   Special Requests   Final    BOTTLES DRAWN AEROBIC AND ANAEROBIC Blood Culture adequate volume   Culture   Final    NO GROWTH 5 DAYS Performed at [facility], [address], [place], [place] [address]    Report Status 02/20/2024 FINAL  Final  Blood Culture (routine x 2)     Status: None   Collection Time: 02/15/24  2:00 AM   Specimen: BLOOD  Result Value Ref Range Status   Specimen Description BLOOD LEFT ANTECUBITAL  Final   Special Requests   Final    BOTTLES DRAWN AEROBIC AND ANAEROBIC Blood Culture adequate volume   Culture   Final    NO GROWTH 5 DAYS Performed at [facility], [address], [place], [place] [address]    Report Status 02/20/2024 FINAL  Final  MRSA Next Gen by PCR, Nasal     Status: None   Collection Time: 02/15/24  4:22 AM   Specimen: Nasal Mucosa; Nasal Swab  Result Value Ref Range  Status   MRSA by PCR Next Gen NOT DETECTED NOT DETECTED Final    Comment: (NOTE) The GeneXpert MRSA Assay (FDA approved for NASAL specimens only), is one component of a comprehensive MRSA colonization surveillance program. It is not intended to diagnose MRSA infection nor to guide or monitor treatment for MRSA infections. Test performance is not FDA approved in patients less than 7 years old. Performed at [facility], [address] [place], [place] [address]   Resp panel by RT-PCR (RSV, Flu A&B, Covid) Anterior Nasal Swab     Status: None   Collection Time: 02/20/24 11:02 PM   Specimen: Anterior Nasal Swab  Result Value Ref Range Status   SARS Coronavirus 2 by RT PCR NEGATIVE NEGATIVE Final   Influenza A by PCR NEGATIVE NEGATIVE Final   Influenza B by PCR NEGATIVE NEGATIVE Final    Comment: (NOTE) The Xpert Xpress SARS-CoV-2/FLU/RSV plus assay is intended as an aid in the diagnosis of influenza from Nasopharyngeal swab specimens and should not be used as a sole basis for treatment. Nasal washings and aspirates  are unacceptable for Xpert Xpress SARS-CoV-2/FLU/RSV testing.  Fact Sheet for Patients: bloggercourse.com  Fact Sheet for Healthcare Providers: seriousbroker.it  This test is not yet approved or cleared by the [place]  FDA and has been authorized for detection and/or diagnosis of SARS-CoV-2 by FDA under an Emergency Use Authorization (EUA). This EUA will remain in effect (meaning this test can be used) for the duration of the COVID-19 declaration under Section 564(b)(1) of the Act, 21 U.S.C. section 360bbb-3(b)(1), unless the authorization is terminated or revoked.     Resp Syncytial Virus by PCR NEGATIVE NEGATIVE Final    Comment: (NOTE) Fact Sheet for Patients: bloggercourse.com  Fact Sheet for Healthcare Providers: seriousbroker.it  This test is not  yet approved or cleared by the [place]  FDA and has been authorized for detection and/or diagnosis of SARS-CoV-2 by FDA under an Emergency Use Authorization (EUA). This EUA will remain in effect (meaning this test can be used) for the duration of the COVID-19 declaration under Section 564(b)(1) of the Act, 21 U.S.C. section 360bbb-3(b)(1), unless the authorization is terminated or revoked.  Performed at [facility], [address], [place], [place] [address]       Radiology Studies: CT Angio Chest PE W and/or Wo Contrast Result Date: 02/21/2024 CLINICAL DATA:  Shortness of breath EXAM: CT ANGIOGRAPHY CHEST WITH CONTRAST TECHNIQUE: Multidetector CT imaging of the chest was performed using the standard protocol during bolus administration of intravenous contrast. Multiplanar CT image reconstructions and MIPs were obtained to evaluate the vascular anatomy. RADIATION DOSE REDUCTION: This exam was performed according to the departmental dose-optimization program which includes automated exposure control, adjustment of the mA and/or kV according to patient size and/or use of iterative reconstruction technique. CONTRAST:  75mL OMNIPAQUE  IOHEXOL  350 MG/ML SOLN COMPARISON:  Chest x-ray from the previous day FINDINGS: Cardiovascular: Atherosclerotic calcifications of the thoracic aorta are noted. Changes of prior coronary bypass grafting are seen. The pulmonary artery shows a normal branching pattern bilaterally. No filling defect to suggest pulmonary embolism is noted. Coronary calcifications are noted. Heart is mildly. Mediastinum/Nodes: Esophagus is within normal limits. No hilar or mediastinal adenopathy is noted. The thoracic inlet is unremarkable. Lungs/Pleura: Lungs show patchy bibasilar infiltrate with associated small effusions consistent with multifocal pneumonia. Upper Abdomen: Gallbladder has been surgically removed. Musculoskeletal: No chest wall abnormality. No acute or significant  osseous findings. Review of the MIP images confirms the above findings. IMPRESSION: No evidence of pulmonary emboli. Bibasilar infiltrates consistent with multifocal pneumonia. Aortic Atherosclerosis (ICD10-I70.0). Electronically Signed   By: Oneil Devonshire M.D.   On: 02/21/2024 01:59   DG Chest Portable 1 View Result Date: 02/20/2024 CLINICAL DATA:  Shortness of breath EXAM: PORTABLE CHEST 1 VIEW COMPARISON:  02/18/2024, 02/15/2024, 01/20/2023 FINDINGS: Post sternotomy changes. Cardiomegaly with vascular congestion. Patchy bilateral pulmonary opacities either reflecting mild edema or diffuse pneumonia. Suspicion of small pleural effusions. Aortic atherosclerosis. Not much interval change compared with radiograph 2 days prior. IMPRESSION: Cardiomegaly with vascular congestion. Patchy bilateral pulmonary opacities either reflecting mild edema or diffuse pneumonia. Suspicion of small pleural effusions. Electronically Signed   By: Luke Bun M.D.   On: 02/20/2024 23:36      LOS: 0 days    Elgin Lam, MD [facility] 02/21/2024, 7:22 AM   If 7PM-7AM, please contact night-coverage [email or web address]  "

## 2024-02-21 NOTE — ED Notes (Signed)
 ED Provider at bedside, speaking with pt son

## 2024-02-21 NOTE — ED Notes (Signed)
 Speech therapy just finished evaluation and patient is to remain npo.

## 2024-02-21 NOTE — ED Notes (Signed)
 Xanax  taken without noted complications with water.

## 2024-02-22 DIAGNOSIS — J189 Pneumonia, unspecified organism: Secondary | ICD-10-CM | POA: Diagnosis not present

## 2024-02-22 LAB — CBC
HCT: 19.8 % — ABNORMAL LOW (ref 39.0–52.0)
Hemoglobin: 6.3 g/dL — CL (ref 13.0–17.0)
MCH: 29.3 pg (ref 26.0–34.0)
MCHC: 31.8 g/dL (ref 30.0–36.0)
MCV: 92.1 fL (ref 80.0–100.0)
Platelets: 288 K/uL (ref 150–400)
RBC: 2.15 MIL/uL — ABNORMAL LOW (ref 4.22–5.81)
RDW: 17.5 % — ABNORMAL HIGH (ref 11.5–15.5)
WBC: 15.2 K/uL — ABNORMAL HIGH (ref 4.0–10.5)
nRBC: 0 % (ref 0.0–0.2)

## 2024-02-22 LAB — BASIC METABOLIC PANEL WITH GFR
Anion gap: 6 (ref 5–15)
BUN: 33 mg/dL — ABNORMAL HIGH (ref 8–23)
CO2: 24 mmol/L (ref 22–32)
Calcium: 8.2 mg/dL — ABNORMAL LOW (ref 8.9–10.3)
Chloride: 113 mmol/L — ABNORMAL HIGH (ref 98–111)
Creatinine, Ser: 0.91 mg/dL (ref 0.61–1.24)
GFR, Estimated: >60 mL/min
Glucose, Bld: 98 mg/dL (ref 70–99)
Potassium: 4.5 mmol/L (ref 3.5–5.1)
Sodium: 142 mmol/L (ref 135–145)

## 2024-02-22 LAB — PREPARE RBC (CROSSMATCH)

## 2024-02-22 MED ORDER — SODIUM CHLORIDE 0.9 % IV SOLN
2.0000 g | Freq: Three times a day (TID) | INTRAVENOUS | Status: DC
  Administered 2024-02-22 – 2024-02-23 (×3): 2 g via INTRAVENOUS
  Filled 2024-02-22 (×3): qty 12.5

## 2024-02-22 MED ORDER — FUROSEMIDE 10 MG/ML IJ SOLN
40.0000 mg | Freq: Once | INTRAMUSCULAR | Status: AC
  Administered 2024-02-22: 40 mg via INTRAVENOUS
  Filled 2024-02-22: qty 4

## 2024-02-22 MED ORDER — SODIUM CHLORIDE 0.9% IV SOLUTION
Freq: Once | INTRAVENOUS | Status: AC
  Administered 2024-02-22: 10 mL via INTRAVENOUS

## 2024-02-22 MED ORDER — SODIUM CHLORIDE 0.9 % IV SOLN: INTRAVENOUS | Status: DC

## 2024-02-22 NOTE — Progress Notes (Signed)
 PT Cancellation Note  Patient Details Name: SIRUS LABRIE MRN: [id number] DOB: 07-05-1940   Cancelled Treatment:    Reason Eval/Treat Not Completed: Other (comment). PT arrived 1444 and another care provider present. PT to return as schedule allows. PT to continue to follow acutely.   [name], PT Acute Rehab   [name] [name] 02/22/2024, 3:37 PM

## 2024-02-22 NOTE — Progress Notes (Signed)
 PT Cancellation Note  Patient Details Name: William Elliott MRN: [id number] DOB: 21-Jan-1941   Cancelled Treatment:    Reason Eval/Treat Not Completed: Medical issues which prohibited therapy. Pt has not yet had transfusion and HgB 6.3. PT to hold at this time. PT to continue to follow acutely.   [name], PT Acute Rehab   [name] [name] 02/22/2024, 4:56 PM

## 2024-02-22 NOTE — Progress Notes (Signed)
 " PROGRESS NOTE    William Elliott  FMW:[id number] DOB: [birth year] DOA: 02/20/2024 PCP: [name], MD    Brief Narrative:  83 year old with history of coronary artery disease status post CABG, chronic A-fib, hypertension, hyperlipidemia, chronic systolic heart failure, GI bleeding due to diverticulosis and history of stroke with dysphagia who was recently admitted to the hospital with acute GI bleeding secondary to diverticulosis, treated for pneumonia and then subsequently discharged home.  Patient came back from home with profound weakness and shortness of breath with low-grade fever.  Found to have multifocal pneumonia likely aspiration pneumonia.  Admitted with broad-spectrum antibiotics.  Subjective: Patient seen and examined.  2 of his sons were at the bedside.  Patient feels dry mouth he has been n.p.o.  Patient denies any chest pain, he has some mucoid sputum production.  Feels very weak and fatigued. Hemoglobin 6.8-agreed for blood transfusion.  Will keep 2 units of blood transfusion today with underlying history of coronary artery disease to keep hemoglobin more than 8. Start chest physiotherapy and incentive spirometry. Patient to be reevaluated by speech therapy today.  Also needs palliative care discussion.  Assessment & Plan:   Bilateral multifocal pneumonia: Recent hospitalization.  Bibasilar infiltrates on CT scan.  Procalcitonin 0.35. Patient on vancomycin , cefepime  and Flagyl . Cultures are negative today.  MRSA swab was -2 weeks ago.  Discontinue vancomycin  but continue cefepime  Flagyl . Chest physiotherapy, incentive spirometry, deep breathing exercises, sputum induction, mucolytic's and bronchodilators. Supplemental oxygen to keep saturations more than 90%. Speech therapy following.  Currently n.p.o.  They will reevaluate him today.  Acute on chronic anemia: Recently admitted with diverticular bleed, received 5 minutes of PRBC transfusion.  Presented with hemoglobin  7.3.  Further drop in hemoglobin to 6.8.  May have slow diverticular bleeding. 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  Patient does have evidence of severe diverticulosis.  Anticipate conservative management unless active bleeding.  Chronic systolic heart failure: Currently euvolemic.  High risk of decompensation.  Needing low maintenance fluid as patient is NPO.  Holding all diuretics today.  Dysphagia: As above.  Currently remains NPO.  Speech therapy following.  Chronic A-fib: Rate controlled.  Not on anticoagulation.  Deconditioning and debility: Work with PT OT and speech.  Patient with recurrent hospitalization and poor prognosis.  Will benefit with palliative care conversations.    DVT prophylaxis: Place and maintain sequential compression device Start: 02/21/24 0445   Code Status: Full code Family Communication: Son at the bedside Disposition Plan: Status is: Inpatient Remains inpatient appropriate because: IV antibiotics, IV fluids     Consultants:  Palliative care  Procedures:  None  Antimicrobials:  Vancomycin  cefepime  and Flagyl  12/21---     Objective: Vitals:   02/22/24 0347 02/22/24 0552 02/22/24 0813 02/22/24 0817  BP: (!) 114/48   122/84  Pulse: 86   85  Resp: 18   17  Temp: 98.2 F (36.8 C)   97.8 F (36.6 C)  TempSrc: Oral   Oral  SpO2: 97%  97% (!) 86%  Weight:  72.6 kg    Height:        Intake/Output Summary (Last 24 hours) at 02/22/2024 1305 Last data filed at 02/22/2024 0453 Gross per 24 hour  Intake 20 ml  Output 600 ml  Net -580 ml   Filed Weights   02/20/24 2303 02/22/24 0552  Weight: 71.6 kg 72.6 kg    Examination:  General exam: Appears calm and comfortable.  Pale looking.  Debilitated.  Very frail. Respiratory system: Mostly conducted upper airway sounds.  Poor air entry bilateral.  Poor respiratory effort.  On room  air. Cardiovascular system: S1 & S2 heard, irregularly irregular.SABRA No JVD, murmurs, rubs, gallops or clicks. No pedal edema. Gastrointestinal system: Soft.  Nontender.  Bowel sound present. Central nervous system: Alert and oriented. No focal neurological deficits.  Gross generalized weakness.    Data Reviewed: I have personally reviewed following labs and imaging studies  CBC: Recent Labs  Lab 02/17/24 0444 02/18/24 0608 02/19/24 0450 02/20/24 2258 02/20/24 2316 02/21/24 0709 02/22/24 0732  WBC 20.8* 12.1* 11.2* 13.3*  --  15.9* 15.2*  NEUTROABS 18.7*  --   --  11.5*  --   --   --   HGB 7.5* 8.0* 7.9* 8.2* 8.8*  8.5* 7.3* 6.3*  HCT 21.1* 23.3* 23.4* 25.9* 26.0*  25.0* 23.4* 19.8*  MCV 85.4 85.7 87.3 93.8  --  93.2 92.1  PLT 155 133* 145* 242  --  211 288   Basic Metabolic Panel: Recent Labs  Lab 02/18/24 0608 02/19/24 0450 02/20/24 2258 02/20/24 2316 02/21/24 0709 02/22/24 0732  NA 134* 136 137 140  139 136 142  K 3.2* 3.4* 4.2 4.1  4.1 4.6 4.5  CL 101 103 103 102 104 113*  CO2 24 25 25   --  20* 24  GLUCOSE 111* 109* 121* 120* 130* 98  BUN 39* 33* 30* 27* 27* 33*  CREATININE 1.43* 1.03 1.24 1.40* 1.09 0.91  CALCIUM  8.0* 8.1* 8.7*  --  8.3* 8.2*  MG 2.2  --  2.5*  --   --   --    GFR: Estimated Creatinine Clearance: 64.3 mL/min (by C-G formula based on SCr of 0.91 mg/dL). Liver Function Tests: Recent Labs  Lab 02/20/24 2258 02/21/24 0709  AST 33 35  ALT 24 25  ALKPHOS 85 80  BILITOT 0.7 0.5  PROT 5.7* 5.2*  ALBUMIN 3.2* 2.9*   No results for input(s): LIPASE, AMYLASE in the last 168 hours. No results for input(s): AMMONIA in the last 168 hours. Coagulation Profile: Recent Labs  Lab 02/16/24 0234 02/20/24 2258  INR 1.4* 1.2   Cardiac Enzymes: No results for input(s): CKTOTAL, CKMB, CKMBINDEX, TROPONINI in the last 168 hours. BNP (last 3 results) Recent Labs    02/20/24 2258  PROBNP 7,935.0*   HbA1C: No results for input(s):  HGBA1C in the last 72 hours. CBG: No results for input(s): GLUCAP in the last 168 hours. Lipid Profile: No results for input(s): CHOL, HDL, LDLCALC, TRIG, CHOLHDL, LDLDIRECT in the last 72 hours. Thyroid  Function Tests: No results for input(s): TSH, T4TOTAL, FREET4, T3FREE, THYROIDAB in the last 72 hours. Anemia Panel: No results for input(s): VITAMINB12, FOLATE, FERRITIN, TIBC, IRON, RETICCTPCT in the last 72 hours. Sepsis Labs: Recent Labs  Lab 02/20/24 2317 02/21/24 0709  PROCALCITON  --  0.35  LATICACIDVEN 1.0  --     Recent Results (from the past 240 hours)  Blood Culture (routine x 2)     Status: None   Collection Time: 02/15/24  1:55 AM   Specimen: BLOOD  Result Value Ref Range Status   Specimen Description BLOOD RIGHT ANTECUBITAL  Final   Special Requests   Final    BOTTLES DRAWN AEROBIC AND ANAEROBIC Blood Culture adequate volume   Culture   Final    NO GROWTH 5 DAYS Performed at [facility], [address], [place], [place] [address]    Report Status 02/20/2024 FINAL  Final  Blood Culture (routine x 2)     Status: None   Collection Time: 02/15/24  2:00 AM   Specimen: BLOOD  Result Value Ref Range Status   Specimen Description BLOOD LEFT ANTECUBITAL  Final   Special Requests   Final    BOTTLES DRAWN AEROBIC AND ANAEROBIC Blood Culture adequate volume   Culture   Final    NO GROWTH 5 DAYS Performed at [facility], [address] [place], [place] [address]    Report Status 02/20/2024 FINAL  Final  MRSA Next Gen by PCR, Nasal     Status: None   Collection Time: 02/15/24  4:22 AM   Specimen: Nasal Mucosa; Nasal Swab  Result Value Ref Range Status   MRSA by PCR Next Gen NOT DETECTED NOT DETECTED Final    Comment: (NOTE) The GeneXpert MRSA Assay (FDA approved for NASAL specimens only), is one component of a comprehensive MRSA colonization surveillance program. It is not intended to diagnose MRSA infection nor  to guide or monitor treatment for MRSA infections. Test performance is not FDA approved in patients less than 88 years old. Performed at [facility], [address], [place], [place] [address]   Resp panel by RT-PCR (RSV, Flu A&B, Covid) Anterior Nasal Swab     Status: None   Collection Time: 02/20/24 11:02 PM   Specimen: Anterior Nasal Swab  Result Value Ref Range Status   SARS Coronavirus 2 by RT PCR NEGATIVE NEGATIVE Final   Influenza A by PCR NEGATIVE NEGATIVE Final   Influenza B by PCR NEGATIVE NEGATIVE Final    Comment: (NOTE) The Xpert Xpress SARS-CoV-2/FLU/RSV plus assay is intended as an aid in the diagnosis of influenza from Nasopharyngeal swab specimens and should not be used as a sole basis for treatment. Nasal washings and aspirates are unacceptable for Xpert Xpress SARS-CoV-2/FLU/RSV testing.  Fact Sheet for Patients: bloggercourse.com  Fact Sheet for Healthcare Providers: seriousbroker.it  This test is not yet approved or cleared by the [place]  FDA and has been authorized for detection and/or diagnosis of SARS-CoV-2 by FDA under an Emergency Use Authorization (EUA). This EUA will remain in effect (meaning this test can be used) for the duration of the COVID-19 declaration under Section 564(b)(1) of the Act, 21 U.S.C. section 360bbb-3(b)(1), unless the authorization is terminated or revoked.     Resp Syncytial Virus by PCR NEGATIVE NEGATIVE Final    Comment: (NOTE) Fact Sheet for Patients: bloggercourse.com  Fact Sheet for Healthcare Providers: seriousbroker.it  This test is not yet approved or cleared by the [place]  FDA and has been authorized for detection and/or diagnosis of SARS-CoV-2 by FDA under an Emergency Use Authorization (EUA). This EUA will remain in effect (meaning this test can be used) for the duration of the COVID-19  declaration under Section 564(b)(1) of the Act, 21 U.S.C. section 360bbb-3(b)(1), unless the authorization is terminated or revoked.  Performed at [facility], [address], [place], [place] [address]   Blood Culture (routine x 2)     Status: None (Preliminary result)   Collection Time: 02/20/24 11:02 PM   Specimen: BLOOD  Result Value Ref Range Status   Specimen Description BLOOD SITE NOT SPECIFIED  Final   Special Requests   Final    BOTTLES DRAWN AEROBIC AND ANAEROBIC Blood Culture adequate volume   Culture   Final    NO  GROWTH 1 DAY Performed at [facility], [address], [place], [place] [address]    Report Status PENDING  Incomplete  Blood Culture (routine x 2)     Status: None (Preliminary result)   Collection Time: 02/20/24 11:07 PM   Specimen: BLOOD  Result Value Ref Range Status   Specimen Description BLOOD RIGHT ANTECUBITAL  Final   Special Requests   Final    BOTTLES DRAWN AEROBIC AND ANAEROBIC Blood Culture results may not be optimal due to an inadequate volume of blood received in culture bottles   Culture   Final    NO GROWTH 1 DAY Performed at [facility], [address], [place], [place] [address]    Report Status PENDING  Incomplete  Respiratory (~20 pathogens) panel by PCR     Status: None   Collection Time: 02/21/24  6:00 AM   Specimen: Nasopharyngeal Swab; Respiratory  Result Value Ref Range Status   Adenovirus NOT DETECTED NOT DETECTED Final   Coronavirus 229E NOT DETECTED NOT DETECTED Final    Comment: (NOTE) The Coronavirus on the Respiratory Panel, DOES NOT test for the novel  Coronavirus (2019 nCoV)    Coronavirus HKU1 NOT DETECTED NOT DETECTED Final   Coronavirus NL63 NOT DETECTED NOT DETECTED Final   Coronavirus OC43 NOT DETECTED NOT DETECTED Final   Metapneumovirus NOT DETECTED NOT DETECTED Final   Rhinovirus / Enterovirus NOT DETECTED NOT DETECTED Final   Influenza A NOT DETECTED NOT DETECTED Final   Influenza B NOT  DETECTED NOT DETECTED Final   Parainfluenza Virus 1 NOT DETECTED NOT DETECTED Final   Parainfluenza Virus 2 NOT DETECTED NOT DETECTED Final   Parainfluenza Virus 3 NOT DETECTED NOT DETECTED Final   Parainfluenza Virus 4 NOT DETECTED NOT DETECTED Final   Respiratory Syncytial Virus NOT DETECTED NOT DETECTED Final   Bordetella pertussis NOT DETECTED NOT DETECTED Final   Bordetella Parapertussis NOT DETECTED NOT DETECTED Final   Chlamydophila pneumoniae NOT DETECTED NOT DETECTED Final   Mycoplasma pneumoniae NOT DETECTED NOT DETECTED Final    Comment: Performed at [facility], [address], [place], [place] [address]         Radiology Studies: CT Angio Chest PE W and/or Wo Contrast Result Date: 02/21/2024 CLINICAL DATA:  Shortness of breath EXAM: CT ANGIOGRAPHY CHEST WITH CONTRAST TECHNIQUE: Multidetector CT imaging of the chest was performed using the standard protocol during bolus administration of intravenous contrast. Multiplanar CT image reconstructions and MIPs were obtained to evaluate the vascular anatomy. RADIATION DOSE REDUCTION: This exam was performed according to the departmental dose-optimization program which includes automated exposure control, adjustment of the mA and/or kV according to patient size and/or use of iterative reconstruction technique. CONTRAST:  75mL OMNIPAQUE  IOHEXOL  350 MG/ML SOLN COMPARISON:  Chest x-ray from the previous day FINDINGS: Cardiovascular: Atherosclerotic calcifications of the thoracic aorta are noted. Changes of prior coronary bypass grafting are seen. The pulmonary artery shows a normal branching pattern bilaterally. No filling defect to suggest pulmonary embolism is noted. Coronary calcifications are noted. Heart is mildly. Mediastinum/Nodes: Esophagus is within normal limits. No hilar or mediastinal adenopathy is noted. The thoracic inlet is unremarkable. Lungs/Pleura: Lungs show patchy bibasilar infiltrate with associated small effusions  consistent with multifocal pneumonia. Upper Abdomen: Gallbladder has been surgically removed. Musculoskeletal: No chest wall abnormality. No acute or significant osseous findings. Review of the MIP images confirms the above findings. IMPRESSION: No evidence of pulmonary emboli. Bibasilar infiltrates consistent with multifocal pneumonia.  Aortic Atherosclerosis (ICD10-I70.0). Electronically Signed   By: Oneil Devonshire M.D.   On: 02/21/2024 01:59   DG Chest Portable 1 View Result Date: 02/20/2024 CLINICAL DATA:  Shortness of breath EXAM: PORTABLE CHEST 1 VIEW COMPARISON:  02/18/2024, 02/15/2024, 01/20/2023 FINDINGS: Post sternotomy changes. Cardiomegaly with vascular congestion. Patchy bilateral pulmonary opacities either reflecting mild edema or diffuse pneumonia. Suspicion of small pleural effusions. Aortic atherosclerosis. Not much interval change compared with radiograph 2 days prior. IMPRESSION: Cardiomegaly with vascular congestion. Patchy bilateral pulmonary opacities either reflecting mild edema or diffuse pneumonia. Suspicion of small pleural effusions. Electronically Signed   By: Luke Bun M.D.   On: 02/20/2024 23:36        Scheduled Meds:  sodium chloride    Intravenous Once   ALPRAZolam   0.5 mg Oral Q8H   arformoterol   15 mcg Nebulization BID   And   umeclidinium bromide   1 puff Inhalation Daily   Bempedoic Acid -Ezetimibe   1 tablet Oral Daily   clotrimazole   10 mg Oral 5 X Daily   ferrous sulfate   325 mg Oral Q breakfast   mexiletine  200 mg Oral BID   pantoprazole   40 mg Oral Daily   Continuous Infusions:  sodium chloride  40 mL/hr at 02/22/24 9176   ceFEPime  (MAXIPIME ) IV     metronidazole  500 mg (02/22/24 1257)     LOS: 1 day    Time spent: 52 minutes    Renato Applebaum, MD [facility]   "

## 2024-02-22 NOTE — Progress Notes (Signed)
 Speech Language Pathology Treatment: Dysphagia  Patient Details Name: William Elliott MRN: [id number] DOB: Jan 28, 1941 Today's Date: 02/22/2024 Time: 8571-8552 SLP Time Calculation (min) (ACUTE ONLY): 19 min  Assessment / Plan / Recommendation Clinical Impression  Rec: continue NPO except ice chips after oral care, meds crushed in puree. SLP will continue to follow and consider repeat MBS pending GOC decisions.  Patient seen by SLP for skilled treatment focused on dysphagia goals. His son was in the room and participated in education. RN was in the process of giving patient his medications when SLP arrived. SLP observed him to have immediate and delayed cough when taking medications in puree in addition to some small sips of water. He grimaced at times and per son, patient has been c/o pain when swallowing. He is currently being treated for oral thrush which could explain his pain while swallowing. SLP reviewed MBS results and recommendations and showed son some of the images. (He had reviewed this with another SLP on previous date). SLP discussed patient's aspiration risk and inquired about patient's baseline mobility and oral care/hygiene. Son indicated that patient is very diligent with his oral care and at home he is able to ambulate with a walker. SLP discussed potential benefit for a repeat MBS to determine if any significant changes since MBS on 02/18/24 and to determine if any strategies would help reduce patient's aspiration risk. SLP did inform son that at some point, decisions on nutrition will need to be made, be that oral with accepting aspiration risks, or artificial.     HPI HPI: William Elliott is an 83 yo male with recent admission 12/15-12/20 for acute GIB complicated by ABLA with hemorrhagic shock. Pt was also dx acute on chronic CHF exacerbation and PNA. 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 Pharyngeal residue and larygneal penetration were  noted across consistencies, as well as aspiration of liquids. Pt returned to the hospital on 12/21 due to acute shortness of breath and fever. Pt admitted with concern for PNA and also started on tx for thrush. PMH also includes:  CAD s/p CABG, chronic A-fib (not on anticoagulation d/t previous GIB), HTN, HLD, prior CVA (2017), COPD      SLP Plan  Continue with current plan of care        Swallow Evaluation Recommendations         Recommendations  Diet recommendations: NPO Medication Administration: Crushed with puree Supervision: Staff to assist with self feeding Compensations: Slow rate;Small sips/bites Postural Changes and/or Swallow Maneuvers: Seated upright 90 degrees                  Oral care BID;Staff/trained caregiver to provide oral care;Oral care prior to ice chip/H20     Dysphagia, oropharyngeal phase (R13.12);Dysphagia, pharyngoesophageal phase (R13.14)     Continue with current plan of care     Norleen IVAR Blase, MA, CCC-SLP Speech Therapy   02/22/2024, 4:09 PM

## 2024-02-23 ENCOUNTER — Inpatient Hospital Stay (HOSPITAL_COMMUNITY)

## 2024-02-23 DIAGNOSIS — Z7189 Other specified counseling: Secondary | ICD-10-CM | POA: Diagnosis not present

## 2024-02-23 DIAGNOSIS — R1312 Dysphagia, oropharyngeal phase: Secondary | ICD-10-CM

## 2024-02-23 DIAGNOSIS — J690 Pneumonitis due to inhalation of food and vomit: Secondary | ICD-10-CM | POA: Diagnosis not present

## 2024-02-23 DIAGNOSIS — J189 Pneumonia, unspecified organism: Secondary | ICD-10-CM | POA: Diagnosis not present

## 2024-02-23 LAB — COMPREHENSIVE METABOLIC PANEL WITH GFR
ALT: 18 U/L (ref 0–44)
AST: 28 U/L (ref 15–41)
Albumin: 2.7 g/dL — ABNORMAL LOW (ref 3.5–5.0)
Alkaline Phosphatase: 84 U/L (ref 38–126)
Anion gap: 9 (ref 5–15)
BUN: 34 mg/dL — ABNORMAL HIGH (ref 8–23)
CO2: 21 mmol/L — ABNORMAL LOW (ref 22–32)
Calcium: 8.1 mg/dL — ABNORMAL LOW (ref 8.9–10.3)
Chloride: 112 mmol/L — ABNORMAL HIGH (ref 98–111)
Creatinine, Ser: 0.86 mg/dL (ref 0.61–1.24)
GFR, Estimated: >60 mL/min
Glucose, Bld: 98 mg/dL (ref 70–99)
Potassium: 3.6 mmol/L (ref 3.5–5.1)
Sodium: 143 mmol/L (ref 135–145)
Total Bilirubin: 0.6 mg/dL (ref 0.0–1.2)
Total Protein: 4.8 g/dL — ABNORMAL LOW (ref 6.5–8.1)

## 2024-02-23 LAB — CBC
HCT: 24.8 % — ABNORMAL LOW (ref 39.0–52.0)
Hemoglobin: 8.1 g/dL — ABNORMAL LOW (ref 13.0–17.0)
MCH: 29.1 pg (ref 26.0–34.0)
MCHC: 32.7 g/dL (ref 30.0–36.0)
MCV: 89.2 fL (ref 80.0–100.0)
Platelets: 301 K/uL (ref 150–400)
RBC: 2.78 MIL/uL — ABNORMAL LOW (ref 4.22–5.81)
RDW: 16.7 % — ABNORMAL HIGH (ref 11.5–15.5)
WBC: 15.1 K/uL — ABNORMAL HIGH (ref 4.0–10.5)
nRBC: 0 % (ref 0.0–0.2)

## 2024-02-23 LAB — BPAM RBC
Blood Product Expiration Date: 202601162359
Blood Product Expiration Date: 202601232359
ISSUE DATE / TIME: 202512231454
ISSUE DATE / TIME: 202512231938
Unit Type and Rh: 5100
Unit Type and Rh: 5100

## 2024-02-23 LAB — TYPE AND SCREEN
ABO/RH(D): O POS
Antibody Screen: NEGATIVE
Unit division: 0
Unit division: 0

## 2024-02-23 LAB — MAGNESIUM: Magnesium: 2.5 mg/dL — ABNORMAL HIGH (ref 1.7–2.4)

## 2024-02-23 MED ORDER — SODIUM CHLORIDE 0.9 % IV SOLN
3.0000 g | Freq: Four times a day (QID) | INTRAVENOUS | Status: DC
  Administered 2024-02-23 – 2024-02-26 (×12): 3 g via INTRAVENOUS
  Filled 2024-02-23 (×13): qty 8

## 2024-02-23 NOTE — Procedures (Signed)
 Modified Barium Swallow Study  Patient Details  Name: William Elliott MRN: [id number] Date of Birth: 12-14-1940  Today's Date: 02/23/2024  Modified Barium Swallow completed.  Full report located under Chart Review in the Imaging Section.  History of Present Illness William Elliott is an 83 yo male with recent admission 12/15-12/20 for acute GIB complicated by ABLA with hemorrhagic shock. Pt was also dx acute on chronic CHF exacerbation and PNA. 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 Pharyngeal residue and larygneal penetration were noted across consistencies, as well as aspiration of liquids. Pt returned to the hospital on 12/21 due to acute shortness of breath and fever. Pt admitted with concern for PNA and also started on tx for thrush. PMH also includes:  CAD s/p CABG, chronic A-fib (not on anticoagulation d/t previous GIB), HTN, HLD, prior CVA (2017), COPD   Clinical Impression Rec: continue NPO, oral meds as per MD recommendation,  He would benefit from GOC discussion with medical care team and palliative care consult. SLP will continue to follow.  William Elliott presents with a severe oropharyngeal dysphagia as per this MBS. As compared to MBS on 12/19, his epiglottis appears edematous (see image) and he exhibited an increase in pharyngeal residuals, specifically posterior pharyngeal wall residuals. Today's study focused on trial of various strategies to determine their effectiveness with patient's swallow function and airway protection. There was no observed benefit to reclined posture, posterior head tilt, right head turn or left head turn. PO's of thin liquid, nectar thick liquid and puree solids were used during this MBS. Suspected cervical osteophytes prevent epiglottic inversion and osteophytes further down cervical spine impacted bolus transit through upper esopahagus. Patient with aspiration of gross amount of thin liquids prior to swallow  initiation which was not immediately sensed. (PAS 8) as well as aspiration during and after the swallow with thin and nectar thick liquids from residuals spilling over arytenoids into airway.  DIGEST Swallow Severity Rating*  Safety: 4  Efficiency:3  Overall Pharyngeal Swallow Severity: 4 1: mild; 2: moderate; 3: severe; 4: profound  *The Dynamic Imaging Grade of Swallowing Toxicity is standardized for the head and neck cancer population, however, demonstrates promising clinical applications across populations to standardize the clinical rating of pharyngeal swallow safety and severity.        Factors that may increase risk of adverse event in presence of aspiration William Elliott 2021): Poor general health and/or compromised immunity;Frequent aspiration of large volumes;Weak cough;Limited mobility;Frail or deconditioned  Swallow Evaluation Recommendations Recommendations: NPO Medication Administration: Other (Comment) (per MD recommendations) Oral care recommendations: Oral care QID (4x/day) Recommended consults: Consider Palliative care Caregiver Recommendations: Have oral suction available      William IVAR Blase, MA, CCC-SLP Speech Therapy  02/23/2024,1:19 PM

## 2024-02-23 NOTE — Plan of Care (Signed)
" °  Problem: Education: Goal: Knowledge of General Education information will improve Description: Including pain rating scale, medication(s)/side effects and non-pharmacologic comfort measures Outcome: Progressing   Problem: Health Behavior/Discharge Planning: Goal: Ability to manage health-related needs will improve Outcome: Progressing   Problem: Clinical Measurements: Goal: Ability to maintain clinical measurements within normal limits will improve Outcome: Progressing Goal: Will remain free from infection Outcome: Progressing Goal: Diagnostic test results will improve Outcome: Progressing Goal: Cardiovascular complication will be avoided Outcome: Progressing   Problem: Nutrition: Goal: Adequate nutrition will be maintained Outcome: Progressing   Problem: Coping: Goal: Level of anxiety will decrease Outcome: Progressing   Problem: Elimination: Goal: Will not experience complications related to bowel motility Outcome: Progressing Goal: Will not experience complications related to urinary retention Outcome: Progressing   Problem: Pain Managment: Goal: General experience of comfort will improve and/or be controlled Outcome: Progressing   Problem: Safety: Goal: Ability to remain free from injury will improve Outcome: Progressing   Problem: Skin Integrity: Goal: Risk for impaired skin integrity will decrease Outcome: Progressing   Problem: Activity: Goal: Ability to tolerate increased activity will improve Outcome: Progressing   Problem: Clinical Measurements: Goal: Ability to maintain a body temperature in the normal range will improve Outcome: Progressing   Problem: Respiratory: Goal: Ability to maintain adequate ventilation will improve Outcome: Progressing Goal: Ability to maintain a clear airway will improve Outcome: Progressing   Problem: Clinical Measurements: Goal: Respiratory complications will improve Outcome: Not Progressing   Problem:  Activity: Goal: Risk for activity intolerance will decrease Outcome: Not Progressing   "

## 2024-02-23 NOTE — Progress Notes (Signed)
 Speech Language Pathology Treatment: Dysphagia  Patient Details Name: William Elliott MRN: [id number] DOB: 13-Jun-1940 Today's Date: 02/23/2024 Time: 1610-1630 SLP Time Calculation (min) (ACUTE ONLY): 20 min  Assessment / Plan / Recommendation Clinical Impression  Family to continue discussions amongst themselves and medical team regarding potential PEG. Continue NPO, SLP will continue to follow.  Patient seen by SLP as well as attending MD (Dr. Kathrin), patient's spouse, son, daughter to discuss options related to nutrition. SLP provided education and insight as needed related to patient's chronic and severe dysphagia. [name] son is hopeful that a PEG would help get patient nutrition to get him strong enough to participate in SNF rehab to see if he could improve to at least close to his baseline. Patient did not appear to have decided either way on the matter and would likely benefit from further conversations with medical team, including palliative care team. He asked SLP what he would do if it were his decision and SLP encouraged him to consider his QOL and speak more with his family.     HPI HPI: William Elliott is an 83 yo male with recent admission 12/15-12/20 for acute GIB complicated by ABLA with hemorrhagic shock. Pt was also dx acute on chronic CHF exacerbation and PNA. 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 Pharyngeal residue and larygneal penetration were noted across consistencies, as well as aspiration of liquids. Pt returned to the hospital on 12/21 due to acute shortness of breath and fever. Pt admitted with concern for PNA and also started on tx for thrush. MBS on 12/24 for assessment of swallow strategies; nothing found to significantly reduce aspiration risk. Patient and family spoke with SLP and attending (Dr. Kathrin) and discussions ongoing on potential PEG. PMH also includes:  CAD s/p CABG, chronic A-fib (not on anticoagulation d/t previous  GIB), HTN, HLD, prior CVA (2017), COPD      SLP Plan           Swallow Evaluation Recommendations   Recommendations: NPO Medication Administration: Other (Comment) (per MD recommendations) Oral care recommendations: Oral care QID (4x/day) Recommended consults: Consider Palliative care Caregiver Recommendations: Have oral suction available     Recommendations  Diet recommendations: NPO Medication Administration: Crushed with puree Postural Changes and/or Swallow Maneuvers: Seated upright 90 degrees                  Oral care BID;Staff/trained caregiver to provide oral care   Intermittent Supervision/Assistance Dysphagia, oropharyngeal phase (R13.12)           William IVAR Blase, MA, CCC-SLP Speech Therapy   02/23/2024, 4:44 PM

## 2024-02-23 NOTE — Evaluation (Signed)
 Physical Therapy Evaluation Patient Details Name: William Elliott MRN: [id number] DOB: [birth year] Today's Date: 02/23/2024  History of Present Illness  83 yo male admitted with anemia s/p transfusion, Pna, weakness. Recent d/c 12/21. PMH:  coronary artery disease, status post prior CABG, NSTEMI, GI bleed, falls, hemorrhagic shock, anemia, Afib, CHF,  Clinical Impression  On eval, pt required Min A for mobility on today. He ambulated ~15 feet around the room with a RW. Pt presents with general weakness, decreased activity tolerance, and impaired gait/balance. He remains at risk for falls when mobilizing. Pt tolerated activity fairly well on today. He reported some mild dizziness and had some mild dyspnea during session. Patient will benefit from continued inpatient follow up therapy, <3 hours/day, it pf/family are agreeable-per chart review, ongoing discussion with care team at this time. Will continue to follow and progress activity as tolerated.         If plan is discharge home, recommend the following: A little help with walking and/or transfers;A little help with bathing/dressing/bathroom;Assistance with cooking/housework;Assist for transportation;Help with stairs or ramp for entrance   Can travel by private vehicle        Equipment Recommendations None recommended by PT  Recommendations for Other Services       Functional Status Assessment Patient has had a recent decline in their functional status and demonstrates the ability to make significant improvements in function in a reasonable and predictable amount of time.     Precautions / Restrictions Precautions Precautions: Fall Precaution/Restrictions Comments: dizziness Restrictions Weight Bearing Restrictions Per Provider Order: No      Mobility  Bed Mobility Overal bed mobility: Needs Assistance Bed Mobility: Supine to Sit, Sit to Supine     Supine to sit: Contact guard, HOB elevated, Used rails Sit to supine: Min  assist, HOB elevated, Used rails   General bed mobility comments: Increased time and effort. Assist for LEs onto bed.    Transfers Overall transfer level: Needs assistance Equipment used: Rolling walker (2 wheels) Transfers: Sit to/from Stand Sit to Stand: Min assist, From elevated surface           General transfer comment: Assist to power up, stabilize, control descent. Cues for safety, technique, hand placement. Increased time.    Ambulation/Gait Ambulation/Gait assistance: Min assist Gait Distance (Feet): 15 Feet Assistive device: Rolling walker (2 wheels) Gait Pattern/deviations: Step-through pattern, Decreased stride length       General Gait Details: Pt able to walk short distance around room with RW. Increased time. Assist to steady pt and manage RW safely. Some mild dyspnea with ambulation. Pt denied dizziness.  Stairs            Wheelchair Mobility     Tilt Bed    Modified Rankin (Stroke Patients Only)       Balance Overall balance assessment: Needs assistance, History of Falls         Standing balance support: During functional activity, Reliant on assistive device for balance, Bilateral upper extremity supported Standing balance-Leahy Scale: Poor                               Pertinent Vitals/Pain Pain Assessment Pain Assessment: No/denies pain    Home Living Family/patient expects to be discharged to:: Private residence Living Arrangements: Spouse/significant other;Children Available Help at Discharge: Family;Available 24 hours/day Type of Home: House Home Access: Level entry     Alternate Level Stairs-Number of Steps: flight with  stair lift Home Layout: Two level;Able to live on main level with bedroom/bathroom;Other (Comment) Home Equipment: Rolling Walker (2 wheels);Cane - quad;Cane - single point;Electric scooter;Grab bars - tub/shower;Hand held shower head;Shower seat - built in Additional Comments: [name] reports  the [employer] will be providing an elevator for the home in the near future.    Prior Function Prior Level of Function : Independent/Modified Independent;History of Falls (last six months);Driving             Mobility Comments: Recent fall when returning to bed after using the bathroom, son assisted. Pt states he typically does not use AD but has been using RW for about 1 week due to increased weakness. Pt and son also report intermittent use of RW in the past. ADLs Comments: Pt is typically Independent to Mod I with ADL and medication management. Pt reports he has intermittent assistance from for donning/doffing socks due to intermittent dizziness. Pt reports he drives with son adding the family recently moved and the pt has gotten lost multiple times while driving since moving. Son and DIL assist with meal prep, other home management tasks, and transportation PRN.     Extremity/Trunk Assessment   Upper Extremity Assessment Upper Extremity Assessment: Defer to OT evaluation    Lower Extremity Assessment Lower Extremity Assessment: Generalized weakness       Communication   Communication Communication: Impaired Factors Affecting Communication: Reduced clarity of speech;Hearing impaired    Cognition Arousal: Alert Behavior During Therapy: WFL for tasks assessed/performed                             Following commands: Intact       Cueing Cueing Techniques: Verbal cues     General Comments      Exercises     Assessment/Plan    PT Assessment Patient needs continued PT services  PT Problem List Decreased strength;Decreased range of motion;Decreased activity tolerance;Decreased mobility;Decreased balance;Decreased knowledge of use of DME       PT Treatment Interventions DME instruction;Gait training;Stair training;Functional mobility training;Therapeutic activities;Therapeutic exercise;Balance training;Neuromuscular re-education;Patient/family education;Cognitive  remediation    PT Goals (Current goals can be found in the Care Plan section)  Acute Rehab PT Goals Patient Stated Goal: get better, stronger PT Goal Formulation: With patient Time For Goal Achievement: 03/08/24 Potential to Achieve Goals: Good    Frequency Min 3X/week     Co-evaluation               AM-PAC PT 6 Clicks Mobility  Outcome Measure Help needed turning from your back to your side while in a flat bed without using bedrails?: A Little Help needed moving from lying on your back to sitting on the side of a flat bed without using bedrails?: A Little Help needed moving to and from a bed to a chair (including a wheelchair)?: A Little Help needed standing up from a chair using your arms (e.g., wheelchair or bedside chair)?: A Little Help needed to walk in hospital room?: A Little Help needed climbing 3-5 steps with a railing? : Total 6 Click Score: 16    End of Session Equipment Utilized During Treatment: Gait belt Activity Tolerance: Patient tolerated treatment well Patient left: in bed;with call bell/phone within reach;with bed alarm set   PT Visit Diagnosis: Muscle weakness (generalized) (M62.81);Difficulty in walking, not elsewhere classified (R26.2);Unsteadiness on feet (R26.81);History of falling (Z91.81)    Time: 1633-1700 PT Time Calculation (min) (ACUTE ONLY):  27 min   Charges:   PT Evaluation $PT Eval Low Complexity: 1 Low PT Treatments $Gait Training: 8-22 mins PT General Charges $$ ACUTE PT VISIT: 1 Visit           Dannial SQUIBB, PT Acute Rehabilitation  Office: [phone]

## 2024-02-23 NOTE — Progress Notes (Signed)
 " PROGRESS NOTE  William Elliott FMW:[id number] DOB: 04/30/1940   PCP: [name], MD  Patient is from: Home.  Lives with wife.   DOA: 02/20/2024 LOS: 2  Chief complaints Chief Complaint  Patient presents with   Shortness of Breath     Brief Narrative / Interim history: 82 year old M with PMH of CAD/CABG, CVA with dysphagia and dysarthria, systolic CHF, chronic A-fib, HTN, HLD, diverticulosis and GI bleed returning with generalized weakness, shortness of breath and low-grade fever, and admitted with multifocal pneumonia concerning for aspiration pneumonia.  Recently hospitalized for acute GI bleed due to diverticulosis and treated for pneumonia and discharged home.  Evaluated by SLP who recommended NPO.  IR consulted for G-tube placement.  Palliative consulted as well.    Subjective: Seen and examined earlier this morning.  No major events overnight or this morning.  No major complaints other than some shortness of breath and cough.  Denies chest pain.  Lengthy discussion with patient and patient's son at bedside about tube feeding.  They would like to proceed with G-tube after SLP reevaluation today..    Assessment and plan: Bilateral multifocal pneumonia: Concerning for aspiration pneumonia and patient with chronic dysphagia.  CT chest showed bilateral infiltrate.  Pro-Cal 0.35.   -Was on vancomycin , cefepime  and Flagyl .  De-escalated to Unasyn .   -Pulmonary toilet with chest physiotherapy, incentive for magery, mucolytic's and bronchodilators.   -Recent hospitalization.  Bibasilar infiltrates on CT scan.  Procalcitonin 0.35. -SLP recommended NPO.  IR consulted for G-tube after discussion with patient and son. -Continue aspiration precaution   Acute on chronic anemia: Recently hospitalized and transfused about 5 units. 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  Hgb dropped to 6.3 on 12/23.  Improved to  8.1 after 1 unit.  No report of overt bleeding. Recent Labs    02/16/24 0234 02/16/24 1819 02/17/24 0444 02/18/24 0608 02/19/24 0450 02/20/24 2258 02/20/24 2316 02/21/24 0709 02/22/24 0732 02/23/24 0810  HGB 8.7* 8.0* 7.5* 8.0* 7.9* 8.2* 8.8*  8.5* 7.3* 6.3* 8.1*  - Continue monitoring   Chronic HFrEF: Currently euvolemic.  Excellent urine output. - Hold further diuretics while NPO. -Hold off IV fluid   Oropharyngeal dysphagia - SLP recommended NPO. -IR consulted for G-tube after discussion with patient and son.   Chronic A-fib: Rate controlled.  Not on anticoagulation due to GI bleed.   Debility and physical deconditioning: At baseline, ambulates with rolling walker.  Also has a stair lift. - PT/OT eval.  Body mass index is 22.71 kg/m.          DVT prophylaxis:  Place and maintain sequential compression device Start: 02/21/24 0445  Code Status: Full code Family Communication: Updated patient's son at bedside. Level of care: Progressive Status is: Inpatient Remains inpatient appropriate because: Aspiration pneumonia, dysphagia   Final disposition: To be determined   55 minutes with more than 50% spent in reviewing records, counseling patient/family and coordinating care.  Consultants:  Palliative medicine Interventional radiology  Procedures: None  Microbiology summarized: COVID-19, influenza and RSV PCR nonreactive A-20 pathogen RVP nonreactive Blood cultures NGTD  Objective: Vitals:   02/23/24 0500 02/23/24 0559 02/23/24 0913 02/23/24 1321  BP:  119/66  (!) 119/50  Pulse:  72  78  Resp:  18    Temp:  98.2 F (36.8 C)  98.4 F (36.9 C)  TempSrc:  Oral  Oral  SpO2:  98% 98% 99%  Weight:  71.8 kg     Height:        Examination:  GENERAL: No apparent distress.  Nontoxic. HEENT: MMM.  Vision and hearing grossly intact.  NECK: Supple.  No apparent JVD.  RESP:  No IWOB.  Fair aeration bilaterally.  Rhonchi and rales over LLL. CVS:  RRR.  Heart sounds normal.  ABD/GI/GU: BS+. Abd soft, NTND.  MSK/EXT:  Moves extremities. No apparent deformity.  Chronic left foot edema. SKIN: no apparent skin lesion or wound NEURO: AA.  Oriented appropriately.  Dysarthria.  No apparent focal neuro deficit. PSYCH: Calm. Normal affect.   Sch Meds:  Scheduled Meds:  ALPRAZolam   0.5 mg Oral Q8H   arformoterol   15 mcg Nebulization BID   And   umeclidinium bromide   1 puff Inhalation Daily   Bempedoic Acid -Ezetimibe   1 tablet Oral Daily   clotrimazole   10 mg Oral 5 X Daily   ferrous sulfate   325 mg Oral Q breakfast   mexiletine  200 mg Oral BID   pantoprazole   40 mg Oral Daily   Continuous Infusions:  ceFEPime  (MAXIPIME ) IV 2 g (02/23/24 1228)   metronidazole  500 mg (02/23/24 1232)   PRN Meds:.acetaminophen  **OR** acetaminophen , albuterol , ipratropium-albuterol , magic mouthwash w/lidocaine , menthol , ondansetron  **OR** ondansetron  (ZOFRAN ) IV  Antimicrobials: Anti-infectives (From admission, onward)    Start     Dose/Rate Route Frequency Ordered Stop   02/22/24 1800  ceFEPIme  (MAXIPIME ) 2 g in sodium chloride  0.9 % 100 mL IVPB        2 g 200 mL/hr over 30 Minutes Intravenous Every 8 hours 02/22/24 1148     02/21/24 2200  vancomycin  (VANCOCIN ) IVPB 1000 mg/200 mL premix  Status:  Discontinued        1,000 mg 200 mL/hr over 60 Minutes Intravenous Every 24 hours 02/21/24 0458 02/22/24 1148   02/21/24 1000  metroNIDAZOLE  (FLAGYL ) IVPB 500 mg        500 mg 100 mL/hr over 60 Minutes Intravenous Every 12 hours 02/21/24 0445     02/21/24 1000  ceFEPIme  (MAXIPIME ) 2 g in sodium chloride  0.9 % 100 mL IVPB  Status:  Discontinued        2 g 200 mL/hr over 30 Minutes Intravenous Every 12 hours 02/21/24 0458 02/22/24 1148   02/21/24 0100  azithromycin  (ZITHROMAX ) 500 mg in sodium chloride  0.9 % 250 mL IVPB        500 mg 250 mL/hr over 60 Minutes Intravenous  Once 02/21/24 0053 02/21/24 0309   02/20/24 2315  ceFEPIme  (MAXIPIME ) 2 g in sodium  chloride 0.9 % 100 mL IVPB        2 g 200 mL/hr over 30 Minutes Intravenous  Once 02/20/24 2303 02/20/24 2348   02/20/24 2315  metroNIDAZOLE  (FLAGYL ) IVPB 500 mg        500 mg 100 mL/hr over 60 Minutes Intravenous  Once 02/20/24 2303 02/21/24 0019   02/20/24 2315  vancomycin  (VANCOCIN ) IVPB 1000 mg/200 mL premix  Status:  Discontinued        1,000 mg 200 mL/hr over 60 Minutes Intravenous  Once 02/20/24 2303 02/20/24 2306   02/20/24 2315  vancomycin  (VANCOREADY) IVPB 1750 mg/350 mL        1,750 mg 175 mL/hr over 120 Minutes Intravenous  Once 02/20/24 2306 02/21/24 0224        I have personally reviewed the following labs and images: CBC: Recent Labs  Lab 02/17/24 0444 02/18/24 9391 02/19/24 0450 02/20/24 2258 02/20/24 2316 02/21/24 0709 02/22/24 0732  02/23/24 0810  WBC 20.8*   < > 11.2* 13.3*  --  15.9* 15.2* 15.1*  NEUTROABS 18.7*  --   --  11.5*  --   --   --   --   HGB 7.5*   < > 7.9* 8.2* 8.8*  8.5* 7.3* 6.3* 8.1*  HCT 21.1*   < > 23.4* 25.9* 26.0*  25.0* 23.4* 19.8* 24.8*  MCV 85.4   < > 87.3 93.8  --  93.2 92.1 89.2  PLT 155   < > 145* 242  --  211 288 301   < > = values in this interval not displayed.   BMP &GFR Recent Labs  Lab 02/18/24 0608 02/19/24 0450 02/20/24 2258 02/20/24 2316 02/21/24 0709 02/22/24 0732 02/23/24 0810  NA 134* 136 137 140  139 136 142 143  K 3.2* 3.4* 4.2 4.1  4.1 4.6 4.5 3.6  CL 101 103 103 102 104 113* 112*  CO2 24 25 25   --  20* 24 21*  GLUCOSE 111* 109* 121* 120* 130* 98 98  BUN 39* 33* 30* 27* 27* 33* 34*  CREATININE 1.43* 1.03 1.24 1.40* 1.09 0.91 0.86  CALCIUM  8.0* 8.1* 8.7*  --  8.3* 8.2* 8.1*  MG 2.2  --  2.5*  --   --   --  2.5*   Estimated Creatinine Clearance: 67.3 mL/min (by C-G formula based on SCr of 0.86 mg/dL). Liver & Pancreas: Recent Labs  Lab 02/20/24 2258 02/21/24 0709 02/23/24 0810  AST 33 35 28  ALT 24 25 18   ALKPHOS 85 80 84  BILITOT 0.7 0.5 0.6  PROT 5.7* 5.2* 4.8*  ALBUMIN 3.2* 2.9* 2.7*    No results for input(s): LIPASE, AMYLASE in the last 168 hours. No results for input(s): AMMONIA in the last 168 hours. Diabetic: No results for input(s): HGBA1C in the last 72 hours. No results for input(s): GLUCAP in the last 168 hours. Cardiac Enzymes: No results for input(s): CKTOTAL, CKMB, CKMBINDEX, TROPONINI in the last 168 hours. Recent Labs    02/20/24 2258  PROBNP 7,935.0*   Coagulation Profile: Recent Labs  Lab 02/20/24 2258  INR 1.2   Thyroid  Function Tests: No results for input(s): TSH, T4TOTAL, FREET4, T3FREE, THYROIDAB in the last 72 hours. Lipid Profile: No results for input(s): CHOL, HDL, LDLCALC, TRIG, CHOLHDL, LDLDIRECT in the last 72 hours. Anemia Panel: No results for input(s): VITAMINB12, FOLATE, FERRITIN, TIBC, IRON, RETICCTPCT in the last 72 hours. Urine analysis:    Component Value Date/Time   COLORURINE YELLOW 02/15/2024 0637   APPEARANCEUR CLEAR 02/15/2024 0637   LABSPEC 1.044 (H) 02/15/2024 0637   PHURINE 6.0 02/15/2024 0637   GLUCOSEU >=500 (A) 02/15/2024 0637   HGBUR NEGATIVE 02/15/2024 0637   BILIRUBINUR NEGATIVE 02/15/2024 0637   KETONESUR NEGATIVE 02/15/2024 0637   PROTEINUR NEGATIVE 02/15/2024 0637   NITRITE NEGATIVE 02/15/2024 0637   LEUKOCYTESUR NEGATIVE 02/15/2024 0637   Sepsis Labs: Invalid input(s): PROCALCITONIN, LACTICIDVEN  Microbiology: Recent Results (from the past 240 hours)  Blood Culture (routine x 2)     Status: None   Collection Time: 02/15/24  1:55 AM   Specimen: BLOOD  Result Value Ref Range Status   Specimen Description BLOOD RIGHT ANTECUBITAL  Final   Special Requests   Final    BOTTLES DRAWN AEROBIC AND ANAEROBIC Blood Culture adequate volume   Culture   Final    NO GROWTH 5 DAYS Performed at [facility] Lab, [address], [place], [place]  [address]    Report Status 02/20/2024 FINAL  Final  Blood Culture (routine x 2)     Status: None    Collection Time: 02/15/24  2:00 AM   Specimen: BLOOD  Result Value Ref Range Status   Specimen Description BLOOD LEFT ANTECUBITAL  Final   Special Requests   Final    BOTTLES DRAWN AEROBIC AND ANAEROBIC Blood Culture adequate volume   Culture   Final    NO GROWTH 5 DAYS Performed at [facility], [address] [place], [place] [address]    Report Status 02/20/2024 FINAL  Final  MRSA Next Gen by PCR, Nasal     Status: None   Collection Time: 02/15/24  4:22 AM   Specimen: Nasal Mucosa; Nasal Swab  Result Value Ref Range Status   MRSA by PCR Next Gen NOT DETECTED NOT DETECTED Final    Comment: (NOTE) The GeneXpert MRSA Assay (FDA approved for NASAL specimens only), is one component of a comprehensive MRSA colonization surveillance program. It is not intended to diagnose MRSA infection nor to guide or monitor treatment for MRSA infections. Test performance is not FDA approved in patients less than 63 years old. Performed at [facility] Lab, [address], [place], [place] [address]   Resp panel by RT-PCR (RSV, Flu A&B, Covid) Anterior Nasal Swab     Status: None   Collection Time: 02/20/24 11:02 PM   Specimen: Anterior Nasal Swab  Result Value Ref Range Status   SARS Coronavirus 2 by RT PCR NEGATIVE NEGATIVE Final   Influenza A by PCR NEGATIVE NEGATIVE Final   Influenza B by PCR NEGATIVE NEGATIVE Final    Comment: (NOTE) The Xpert Xpress SARS-CoV-2/FLU/RSV plus assay is intended as an aid in the diagnosis of influenza from Nasopharyngeal swab specimens and should not be used as a sole basis for treatment. Nasal washings and aspirates are unacceptable for Xpert Xpress SARS-CoV-2/FLU/RSV testing.  Fact Sheet for Patients: bloggercourse.com  Fact Sheet for Healthcare Providers: seriousbroker.it  This test is not yet approved or cleared by the [place]  FDA and has been authorized for detection and/or diagnosis of  SARS-CoV-2 by FDA under an Emergency Use Authorization (EUA). This EUA will remain in effect (meaning this test can be used) for the duration of the COVID-19 declaration under Section 564(b)(1) of the Act, 21 U.S.C. section 360bbb-3(b)(1), unless the authorization is terminated or revoked.     Resp Syncytial Virus by PCR NEGATIVE NEGATIVE Final    Comment: (NOTE) Fact Sheet for Patients: bloggercourse.com  Fact Sheet for Healthcare Providers: seriousbroker.it  This test is not yet approved or cleared by the [place]  FDA and has been authorized for detection and/or diagnosis of SARS-CoV-2 by FDA under an Emergency Use Authorization (EUA). This EUA will remain in effect (meaning this test can be used) for the duration of the COVID-19 declaration under Section 564(b)(1) of the Act, 21 U.S.C. section 360bbb-3(b)(1), unless the authorization is terminated or revoked.  Performed at [facility] Lab, [address], [place], [place] [address]   Blood Culture (routine x 2)     Status: None (Preliminary result)   Collection Time: 02/20/24 11:02 PM   Specimen: BLOOD  Result Value Ref Range Status   Specimen Description BLOOD SITE NOT SPECIFIED  Final   Special Requests   Final    BOTTLES DRAWN AEROBIC AND ANAEROBIC Blood Culture adequate volume   Culture   Final    NO GROWTH 2 DAYS Performed  at [facility], [address], [place], [place] [address]    Report Status PENDING  Incomplete  Blood Culture (routine x 2)     Status: None (Preliminary result)   Collection Time: 02/20/24 11:07 PM   Specimen: BLOOD  Result Value Ref Range Status   Specimen Description BLOOD RIGHT ANTECUBITAL  Final   Special Requests   Final    BOTTLES DRAWN AEROBIC AND ANAEROBIC Blood Culture results may not be optimal due to an inadequate volume of blood received in culture bottles   Culture   Final    NO GROWTH 2 DAYS Performed at [facility] Lab, [address] [place], [place] [address]    Report Status PENDING  Incomplete  Respiratory (~20 pathogens) panel by PCR     Status: None   Collection Time: 02/21/24  6:00 AM   Specimen: Nasopharyngeal Swab; Respiratory  Result Value Ref Range Status   Adenovirus NOT DETECTED NOT DETECTED Final   Coronavirus 229E NOT DETECTED NOT DETECTED Final    Comment: (NOTE) The Coronavirus on the Respiratory Panel, DOES NOT test for the novel  Coronavirus (2019 nCoV)    Coronavirus HKU1 NOT DETECTED NOT DETECTED Final   Coronavirus NL63 NOT DETECTED NOT DETECTED Final   Coronavirus OC43 NOT DETECTED NOT DETECTED Final   Metapneumovirus NOT DETECTED NOT DETECTED Final   Rhinovirus / Enterovirus NOT DETECTED NOT DETECTED Final   Influenza A NOT DETECTED NOT DETECTED Final   Influenza B NOT DETECTED NOT DETECTED Final   Parainfluenza Virus 1 NOT DETECTED NOT DETECTED Final   Parainfluenza Virus 2 NOT DETECTED NOT DETECTED Final   Parainfluenza Virus 3 NOT DETECTED NOT DETECTED Final   Parainfluenza Virus 4 NOT DETECTED NOT DETECTED Final   Respiratory Syncytial Virus NOT DETECTED NOT DETECTED Final   Bordetella pertussis NOT DETECTED NOT DETECTED Final   Bordetella Parapertussis NOT DETECTED NOT DETECTED Final   Chlamydophila pneumoniae NOT DETECTED NOT DETECTED Final   Mycoplasma pneumoniae NOT DETECTED NOT DETECTED Final    Comment: Performed at [facility] Lab, [address] [place], [place] [address]    Radiology Studies: DG Swallowing Func-Speech Pathology Result Date: 02/23/2024 Table formatting from the original result was not included. Images from the original result were not included. Modified Barium Swallow Study Patient Details Name: William Elliott MRN: [id number] Date of Birth: 03/27/1940 Today's Date: 02/23/2024 HPI/PMH: HPI: Mr. Helmes is an 83 yo male with recent admission 12/15-12/20 for acute GIB complicated by ABLA with hemorrhagic shock. Pt was also dx  acute on chronic CHF exacerbation and PNA. 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 Pharyngeal residue and larygneal penetration were noted across consistencies, as well as aspiration of liquids. Pt returned to the hospital on 12/21 due to acute shortness of breath and fever. Pt admitted with concern for PNA and also started on tx for thrush. PMH also includes:  CAD s/p CABG, chronic A-fib (not on anticoagulation d/t previous GIB), HTN, HLD, prior CVA (2017), COPD Clinical Impression: Rec: continue NPO, oral meds as per MD recommendation,  He would benefit from GOC discussion with medical care team and palliative care consult. SLP will continue to follow. William Elliott presents with a severe oropharyngeal dysphagia as per this MBS. As compared to MBS on 12/19, his epiglottis appears edematous (see image) and he exhibited an increase in pharyngeal residuals, specifically posterior pharyngeal wall residuals. Today's study focused on trial of various strategies to  determine their effectiveness with patient's swallow function and airway protection. There was no observed benefit to reclined posture, posterior head tilt, right head turn or left head turn. PO's of thin liquid, nectar thick liquid and puree solids were used during this MBS. Suspected cervical osteophytes prevent epiglottic inversion and osteophytes further down cervical spine impacted bolus transit through upper esopahagus. Patient with aspiration of gross amount of thin liquids prior to swallow initiation which was not immediately sensed. (PAS 8) as well as aspiration during and after the swallow with thin and nectar thick liquids from residuals spilling over arytenoids into airway. DIGEST Swallow Severity Rating*  Safety: 4  Efficiency:3  Overall Pharyngeal Swallow Severity: 4 1: mild; 2: moderate; 3: severe; 4: profound *The Dynamic Imaging Grade of Swallowing Toxicity is standardized for the head and neck cancer  population, however, demonstrates promising clinical applications across populations to standardize the clinical rating of pharyngeal swallow safety and severity. Factors that may increase risk of adverse event in presence of aspiration Noe & Lianne 2021): Factors that may increase risk of adverse event in presence of aspiration Noe & Lianne 2021): Poor general health and/or compromised immunity; Frequent aspiration of large volumes; Weak cough; Limited mobility; Frail or deconditioned Recommendations/Plan: Swallowing Evaluation Recommendations Swallowing Evaluation Recommendations Recommendations: NPO Medication Administration: Other (Comment) (per MD recommendations) Oral care recommendations: Oral care QID (4x/day) Recommended consults: Consider Palliative care Caregiver Recommendations: Have oral suction available Treatment Plan Treatment Plan Treatment recommendations: Therapy as outlined in treatment plan below Follow-up recommendations: Follow physicians's recommendations for discharge plan and follow up therapies Functional status assessment: Patient has had a recent decline in their functional status and demonstrates the ability to make significant improvements in function in a reasonable and predictable amount of time. Treatment frequency: Min 2x/week Treatment duration: 1 week Interventions: Aspiration precaution training; Patient/family education; Trials of upgraded texture/liquids; Respiratory muscle strength training Recommendations Recommendations for follow up therapy are one component of a multi-disciplinary discharge planning process, led by the attending physician.  Recommendations may be updated based on patient status, additional functional criteria and insurance authorization. Assessment: Orofacial Exam: Orofacial Exam Oral Cavity: Oral Hygiene: WFL Oral Cavity - Dentition: Adequate natural dentition Orofacial Anatomy: WFL Oral Motor/Sensory Function: WFL Anatomy: Anatomy: Suspected  cervical osteophytes Boluses Administered: Boluses Administered Boluses Administered: Thin liquids (Level 0); Puree; Mildly thick liquids (Level 2, nectar thick)  Oral Impairment Domain: Oral Impairment Domain Lip Closure: No labial escape Tongue control during bolus hold: Not tested Location of oral residue : Tongue Initiation of pharyngeal swallow : Pyriform sinuses  Pharyngeal Impairment Domain: Pharyngeal Impairment Domain Soft palate elevation: No bolus between soft palate (SP)/pharyngeal wall (PW) Laryngeal elevation: Partial superior movement of thyroid  cartilage/partial approximation of arytenoids to epiglottic petiole Anterior hyoid excursion: Partial anterior movement Epiglottic movement: No inversion Laryngeal vestibule closure: Incomplete, narrow column air/contrast in laryngeal vestibule Pharyngoesophageal segment opening: Minimal distention/minimal duration, marked obstruction of flow Pharyngeal residue: Majority of contrast within or on pharyngeal structures Location of pharyngeal residue: Valleculae; Tongue base; Pharyngeal wall; Pyriform sinuses; Diffuse (>3 areas)  Esophageal Impairment Domain: No data recorded Pill: No data recorded Penetration/Aspiration Scale Score: Penetration/Aspiration Scale Score 1.  Material does not enter airway: Puree 8.  Material enters airway, passes BELOW cords without attempt by patient to eject out (silent aspiration) : Thin liquids (Level 0); Mildly thick liquids (Level 2, nectar thick) Compensatory Strategies: Compensatory Strategies Compensatory strategies: Yes Left head turn: Ineffective Ineffective Left Head Turn: Thin liquid (Level 0) Right head turn:  Ineffective Ineffective Right Head Turn: Thin liquid (Level 0) Reclining posture: Ineffective Ineffective Reclining Posture: Thin liquid (Level 0) Posterior head tilt: Ineffective Ineffective Posterior head tilt: Thin liquid (Level 0)   General Information: Caregiver present: No  Diet Prior to this Study: NPO    Temperature : Normal   Respiratory Status: WFL   Supplemental O2: None (Room air)   History of Recent Intubation: No  Behavior/Cognition: Alert; Cooperative; Pleasant mood Self-Feeding Abilities: Able to self-feed Baseline vocal quality/speech: Dysphonic Volitional Cough: Able to elicit Volitional Swallow: Able to elicit Exam Limitations: No limitations Goal Planning: Prognosis for improved oropharyngeal function: Guarded Barriers to Reach Goals: Severity of deficits; Time post onset No data recorded Patient/Family Stated Goal: patient verbalized that he did not want a feeding tube, but I'll get one if I need to Consulted and agree with results and recommendations: Patient Pain: Pain Assessment Pain Assessment: No/denies pain Breathing: 0 Negative Vocalization: 0 Body Language: 0 Consolability: 0 End of Session: Start Time:SLP Start Time (ACUTE ONLY): 1108 Stop Time: SLP Stop Time (ACUTE ONLY): 1123 Time Calculation:SLP Time Calculation (min) (ACUTE ONLY): 15 min Charges: SLP Evaluations $ SLP Speech Visit: 1 Visit SLP Evaluations $MBS Swallow: 1 Procedure $Swallowing Treatment: 1 Procedure SLP visit diagnosis: SLP Visit Diagnosis: Dysphagia, oropharyngeal phase (R13.12) Past Medical History: Past Medical History: Diagnosis Date  Acute lower GI bleeding 2008  Anxiety   Asthma   Carotid artery disease   Asymptomatic left carotid bruit  COPD (chronic obstructive pulmonary disease) (HCC)   Coronary artery disease   a. CABG - 1999 with LIMA-LAD, SVG-DIAG, SVG-OM, SVG-RPDA  b. Cath in setting of NSTEMI 03/18/2015: patent LIMA-LAD, occluded SVG-RCA, occluded SVG-D1, 99% stenosis of SVG-OM treated w/ DES  Depression   Dyslipidemia   Erectile dysfunction   Essential hypertension   History of blood transfusion 2008  History of stroke   Hyperlipidemia   Myocardial infarction [facility])  Past Surgical History: Past Surgical History: Procedure Laterality Date  CARDIAC CATHETERIZATION    CARDIAC CATHETERIZATION N/A 03/18/2015   Procedure: Left Heart Cath and Cors/Grafts Angiography;  Surgeon: Alm LELON Clay, MD;  Location: [facility] INVASIVE CV LAB;  Service: Cardiovascular;  Laterality: N/A;  CARDIAC CATHETERIZATION N/A 03/18/2015  Procedure: Coronary Stent Intervention;  Surgeon: Alm LELON Clay, MD;  Location: [facility] INVASIVE CV LAB;  Service: Cardiovascular;  Laterality: N/A;  CATARACT EXTRACTION W/ INTRAOCULAR LENS  IMPLANT, BILATERAL Bilateral   COLONOSCOPY N/A 02/16/2024  Procedure: COLONOSCOPY;  Surgeon: Albertus Gordy HERO, MD;  Location: [facility] ENDOSCOPY;  Service: Gastroenterology;  Laterality: N/A;  CORONARY ANGIOPLASTY    CORONARY ARTERY BYPASS GRAFT  1999  CORONARY STENT INTERVENTION N/A 04/22/2020  Procedure: CORONARY STENT INTERVENTION;  Surgeon: Claudene Victory LELON, MD;  Location: [facility] INVASIVE CV LAB;  Service: Cardiovascular;  Laterality: N/A;  ESOPHAGOGASTRODUODENOSCOPY N/A 02/16/2024  Procedure: EGD (ESOPHAGOGASTRODUODENOSCOPY);  Surgeon: Albertus Gordy HERO, MD;  Location: [facility] ENDOSCOPY;  Service: Gastroenterology;  Laterality: N/A;  FRACTURE SURGERY    HERNIA REPAIR    HOT HEMOSTASIS N/A 02/16/2024  Procedure: COLONOSCOPY, WITH ARGON PLASMA COAGULATION;  Surgeon: Albertus Gordy HERO, MD;  Location: [facility] ENDOSCOPY;  Service: Gastroenterology;  Laterality: N/A;  LEFT HEART CATH AND CORS/GRAFTS ANGIOGRAPHY N/A 04/22/2020  Procedure: LEFT HEART CATH AND CORS/GRAFTS ANGIOGRAPHY;  Surgeon: Claudene Victory LELON, MD;  Location: [facility] INVASIVE CV LAB;  Service: Cardiovascular;  Laterality: N/A;  TIBIA FRACTURE SURGERY Left [facility]; took bone out of my right hip  TONSILLECTOMY    UMBILICAL HERNIA REPAIR  ~  2000 Norleen IVAR Blase, MA, CCC-SLP Speech Therapy 02/23/2024, 1:24 PM     Lindsie Simar T. Larance Ratledge [facility]  If 7PM-7AM, please contact night-coverage [email or web address] 02/23/2024, 1:41 PM   "

## 2024-02-23 NOTE — Progress Notes (Signed)
 PT Cancellation Note  Patient Details Name: William Elliott MRN: [id number] DOB: Mar 08, 1940   Cancelled Treatment:    Reason Eval/Treat Not Completed:  Pt out of room for procedure earlier today and now ST and MD are in room with pt and family. Will check back as schedule allows.    Dannial SQUIBB, PT Acute Rehabilitation  Office: [phone]

## 2024-02-24 DIAGNOSIS — Z7189 Other specified counseling: Secondary | ICD-10-CM | POA: Diagnosis not present

## 2024-02-24 DIAGNOSIS — J690 Pneumonitis due to inhalation of food and vomit: Secondary | ICD-10-CM | POA: Diagnosis not present

## 2024-02-24 DIAGNOSIS — J189 Pneumonia, unspecified organism: Secondary | ICD-10-CM | POA: Diagnosis not present

## 2024-02-24 DIAGNOSIS — R1312 Dysphagia, oropharyngeal phase: Secondary | ICD-10-CM | POA: Diagnosis not present

## 2024-02-24 LAB — RENAL FUNCTION PANEL
Albumin: 2.6 g/dL — ABNORMAL LOW (ref 3.5–5.0)
Anion gap: 12 (ref 5–15)
BUN: 29 mg/dL — ABNORMAL HIGH (ref 8–23)
CO2: 20 mmol/L — ABNORMAL LOW (ref 22–32)
Calcium: 8.2 mg/dL — ABNORMAL LOW (ref 8.9–10.3)
Chloride: 112 mmol/L — ABNORMAL HIGH (ref 98–111)
Creatinine, Ser: 0.78 mg/dL (ref 0.61–1.24)
GFR, Estimated: >60 mL/min
Glucose, Bld: 93 mg/dL (ref 70–99)
Phosphorus: 2.2 mg/dL — ABNORMAL LOW (ref 2.5–4.6)
Potassium: 3.6 mmol/L (ref 3.5–5.1)
Sodium: 144 mmol/L (ref 135–145)

## 2024-02-24 LAB — CBC
HCT: 23.4 % — ABNORMAL LOW (ref 39.0–52.0)
Hemoglobin: 7.5 g/dL — ABNORMAL LOW (ref 13.0–17.0)
MCH: 29.1 pg (ref 26.0–34.0)
MCHC: 32.1 g/dL (ref 30.0–36.0)
MCV: 90.7 fL (ref 80.0–100.0)
Platelets: 307 K/uL (ref 150–400)
RBC: 2.58 MIL/uL — ABNORMAL LOW (ref 4.22–5.81)
RDW: 16.6 % — ABNORMAL HIGH (ref 11.5–15.5)
WBC: 13.7 K/uL — ABNORMAL HIGH (ref 4.0–10.5)
nRBC: 0 % (ref 0.0–0.2)

## 2024-02-24 LAB — MAGNESIUM: Magnesium: 2.4 mg/dL (ref 1.7–2.4)

## 2024-02-24 MED ORDER — KCL-LACTATED RINGERS-D5W 20 MEQ/L IV SOLN
INTRAVENOUS | Status: DC
  Filled 2024-02-24 (×2): qty 1000

## 2024-02-24 MED ORDER — MELATONIN 3 MG PO TABS: 3.0000 mg | ORAL_TABLET | Freq: Every day | ORAL | Status: DC

## 2024-02-24 MED ORDER — CARMEX CLASSIC LIP BALM EX OINT
TOPICAL_OINTMENT | CUTANEOUS | Status: DC | PRN
  Filled 2024-02-24: qty 10

## 2024-02-24 MED ORDER — MELATONIN 3 MG PO TABS
3.0000 mg | ORAL_TABLET | Freq: Once | ORAL | Status: AC
  Administered 2024-02-24: 3 mg via ORAL
  Filled 2024-02-24: qty 1

## 2024-02-24 NOTE — Progress Notes (Signed)
 " PROGRESS NOTE  William Elliott FMW:[id number] DOB: 24-Apr-1940   PCP: William Norleen PEDLAR, MD  Patient is from: Home.  Lives with wife.   DOA: 02/20/2024 LOS: 3  Chief complaints Chief Complaint  Patient presents with   Shortness of Breath     Brief Narrative / Interim history: 83 year old M with PMH of CAD/CABG, CVA with dysphagia and dysarthria, systolic CHF, chronic A-fib, HTN, HLD, diverticulosis and GI bleed returning with generalized weakness, shortness of breath and low-grade fever, and admitted with multifocal pneumonia concerning for aspiration pneumonia.  Recently hospitalized for acute GI bleed due to diverticulosis and treated for pneumonia and discharged home.  Evaluated by SLP who recommended NPO.  IR consulted for G-tube placement after discussion with patient and family..  Palliative consulted as well.    Subjective: Seen and examined earlier this morning.  No major events overnight or this morning.  No complaint this morning.  Son at bedside.   Assessment and plan: Bilateral multifocal pneumonia: Concerning for aspiration pneumonia and patient with chronic dysphagia.  CT chest showed bilateral infiltrate.  Pro-Cal 0.35.   -Was on vancomycin , cefepime  and Flagyl .  De-escalated to Unasyn , continue.   -Pulmonary toilet with chest PT, IS, mucolytic's and bronchodilators.   -SLP recommended NPO.  IR consulted for G-tube after discussion with patient and family -Gentle IV fluid while n.p.o. -Continue aspiration precaution   Acute on chronic anemia: Recently hospitalized and transfused about 5 units. EGD and colonoscopy last week.  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  Hgb dropped to 6.3 on 12/23.  Improved to 8.1 after 1 unit.  No report of overt bleeding. Recent Labs    02/16/24 1819 02/17/24 0444 02/18/24 0608 02/19/24 0450 02/20/24 2258 02/20/24 2316 02/21/24 0709 02/22/24 0732 02/23/24 0810 02/24/24 0357  HGB  8.0* 7.5* 8.0* 7.9* 8.2* 8.8*  8.5* 7.3* 6.3* 8.1* 7.5*  - Continue monitoring   Chronic HFrEF: Currently euvolemic.  Good urine output. -Hold further diuretics while NPO. - Monitor respiratory and fluid status while on gentle IV fluid   Oropharyngeal dysphagia -SLP recommended NPO. -IR consulted for G-tube after discussion with patient and son.   Chronic A-fib: Rate controlled.  Not on anticoagulation due to GI bleed.   Debility and physical deconditioning: At baseline, ambulates with rolling walker.  Also has a stair lift. - PT/OT eval.  Body mass index is 22.59 kg/m.          DVT prophylaxis:  Place and maintain sequential compression device Start: 02/21/24 0445  Code Status: Full code Family Communication: Updated patient's son at bedside. Level of care: Progressive Status is: Inpatient Remains inpatient appropriate because: Aspiration pneumonia, dysphagia   Final disposition: To be determined   55 minutes with more than 50% spent in reviewing records, counseling patient/family and coordinating care.  Consultants:  Palliative medicine Interventional radiology  Procedures: None  Microbiology summarized: COVID-19, influenza and RSV PCR nonreactive A-20 pathogen RVP nonreactive Blood cultures NGTD  Objective: Vitals:   02/23/24 1935 02/24/24 0437 02/24/24 0500 02/24/24 0909  BP: (!) 129/58 (!) 125/58    Pulse: (!) 57 (!) 47    Resp: 18 17    Temp: 98.3 F (36.8 C) 98.7 F (37.1 C)    TempSrc: Oral     SpO2: 93% 97%  94%  Weight:   71.4 kg   Height:        Examination:  GENERAL: No apparent distress.  Nontoxic. HEENT: MMM.  Vision and hearing grossly intact.  NECK: Supple.  No apparent JVD.  RESP:  No IWOB.  Fair aeration bilaterally.  Rhonchi and rales over LLL. CVS:  RRR. Heart sounds normal.  ABD/GI/GU: BS+. Abd soft, NTND.  MSK/EXT:  Moves extremities. No apparent deformity.  Chronic left foot edema. SKIN: no apparent skin lesion or  wound NEURO: AA.  Oriented appropriately.  Dysarthria.  No apparent focal neuro deficit. PSYCH: Calm. Normal affect.   Sch Meds:  Scheduled Meds:  ALPRAZolam   0.5 mg Oral Q8H   arformoterol   15 mcg Nebulization BID   And   umeclidinium bromide   1 puff Inhalation Daily   Bempedoic Acid -Ezetimibe   1 tablet Oral Daily   clotrimazole   10 mg Oral 5 X Daily   ferrous sulfate   325 mg Oral Q breakfast   mexiletine  200 mg Oral BID   pantoprazole   40 mg Oral Daily   Continuous Infusions:  ampicillin -sulbactam (UNASYN ) IV 3 g (02/24/24 0630)   dextrose  5% lactated ringers  with KCl 20 mEq/L 40 mL/hr at 02/24/24 0905   PRN Meds:.acetaminophen  **OR** acetaminophen , albuterol , ipratropium-albuterol , lip balm, magic mouthwash w/lidocaine , menthol , ondansetron  **OR** ondansetron  (ZOFRAN ) IV  Antimicrobials: Anti-infectives (From admission, onward)    Start     Dose/Rate Route Frequency Ordered Stop   02/23/24 1800  Ampicillin -Sulbactam (UNASYN ) 3 g in sodium chloride  0.9 % 100 mL IVPB        3 g 200 mL/hr over 30 Minutes Intravenous Every 6 hours 02/23/24 1402     02/22/24 1800  ceFEPIme  (MAXIPIME ) 2 g in sodium chloride  0.9 % 100 mL IVPB  Status:  Discontinued        2 g 200 mL/hr over 30 Minutes Intravenous Every 8 hours 02/22/24 1148 02/23/24 1402   02/21/24 2200  vancomycin  (VANCOCIN ) IVPB 1000 mg/200 mL premix  Status:  Discontinued        1,000 mg 200 mL/hr over 60 Minutes Intravenous Every 24 hours 02/21/24 0458 02/22/24 1148   02/21/24 1000  metroNIDAZOLE  (FLAGYL ) IVPB 500 mg  Status:  Discontinued        500 mg 100 mL/hr over 60 Minutes Intravenous Every 12 hours 02/21/24 0445 02/23/24 1402   02/21/24 1000  ceFEPIme  (MAXIPIME ) 2 g in sodium chloride  0.9 % 100 mL IVPB  Status:  Discontinued        2 g 200 mL/hr over 30 Minutes Intravenous Every 12 hours 02/21/24 0458 02/22/24 1148   02/21/24 0100  azithromycin  (ZITHROMAX ) 500 mg in sodium chloride  0.9 % 250 mL IVPB        500  mg 250 mL/hr over 60 Minutes Intravenous  Once 02/21/24 0053 02/21/24 0309   02/20/24 2315  ceFEPIme  (MAXIPIME ) 2 g in sodium chloride  0.9 % 100 mL IVPB        2 g 200 mL/hr over 30 Minutes Intravenous  Once 02/20/24 2303 02/20/24 2348   02/20/24 2315  metroNIDAZOLE  (FLAGYL ) IVPB 500 mg        500 mg 100 mL/hr over 60 Minutes Intravenous  Once 02/20/24 2303 02/21/24 0019   02/20/24 2315  vancomycin  (VANCOCIN ) IVPB 1000 mg/200 mL premix  Status:  Discontinued        1,000 mg 200 mL/hr over 60 Minutes Intravenous  Once 02/20/24 2303 02/20/24 2306   02/20/24 2315  vancomycin  (VANCOREADY) IVPB 1750 mg/350 mL        1,750 mg 175 mL/hr over 120 Minutes Intravenous  Once 02/20/24 2306 02/21/24 0224  I have personally reviewed the following labs and images: CBC: Recent Labs  Lab 02/20/24 2258 02/20/24 2316 02/21/24 0709 02/22/24 0732 02/23/24 0810 02/24/24 0357  WBC 13.3*  --  15.9* 15.2* 15.1* 13.7*  NEUTROABS 11.5*  --   --   --   --   --   HGB 8.2* 8.8*  8.5* 7.3* 6.3* 8.1* 7.5*  HCT 25.9* 26.0*  25.0* 23.4* 19.8* 24.8* 23.4*  MCV 93.8  --  93.2 92.1 89.2 90.7  PLT 242  --  211 288 301 307   BMP &GFR Recent Labs  Lab 02/18/24 0608 02/19/24 0450 02/20/24 2258 02/20/24 2316 02/21/24 0709 02/22/24 0732 02/23/24 0810 02/24/24 0357  NA 134*   < > 137 140  139 136 142 143 144  K 3.2*   < > 4.2 4.1  4.1 4.6 4.5 3.6 3.6  CL 101   < > 103 102 104 113* 112* 112*  CO2 24   < > 25  --  20* 24 21* 20*  GLUCOSE 111*   < > 121* 120* 130* 98 98 93  BUN 39*   < > 30* 27* 27* 33* 34* 29*  CREATININE 1.43*   < > 1.24 1.40* 1.09 0.91 0.86 0.78  CALCIUM  8.0*   < > 8.7*  --  8.3* 8.2* 8.1* 8.2*  MG 2.2  --  2.5*  --   --   --  2.5* 2.4  PHOS  --   --   --   --   --   --   --  2.2*   < > = values in this interval not displayed.   Estimated Creatinine Clearance: 71.9 mL/min (by C-G formula based on SCr of 0.78 mg/dL). Liver & Pancreas: Recent Labs  Lab 02/20/24 2258  02/21/24 0709 02/23/24 0810 02/24/24 0357  AST 33 35 28  --   ALT 24 25 18   --   ALKPHOS 85 80 84  --   BILITOT 0.7 0.5 0.6  --   PROT 5.7* 5.2* 4.8*  --   ALBUMIN 3.2* 2.9* 2.7* 2.6*   No results for input(s): LIPASE, AMYLASE in the last 168 hours. No results for input(s): AMMONIA in the last 168 hours. Diabetic: No results for input(s): HGBA1C in the last 72 hours. No results for input(s): GLUCAP in the last 168 hours. Cardiac Enzymes: No results for input(s): CKTOTAL, CKMB, CKMBINDEX, TROPONINI in the last 168 hours. Recent Labs    02/20/24 2258  PROBNP 7,935.0*   Coagulation Profile: Recent Labs  Lab 02/20/24 2258  INR 1.2   Thyroid  Function Tests: No results for input(s): TSH, T4TOTAL, FREET4, T3FREE, THYROIDAB in the last 72 hours. Lipid Profile: No results for input(s): CHOL, HDL, LDLCALC, TRIG, CHOLHDL, LDLDIRECT in the last 72 hours. Anemia Panel: No results for input(s): VITAMINB12, FOLATE, FERRITIN, TIBC, IRON, RETICCTPCT in the last 72 hours. Urine analysis:    Component Value Date/Time   COLORURINE YELLOW 02/15/2024 0637   APPEARANCEUR CLEAR 02/15/2024 0637   LABSPEC 1.044 (H) 02/15/2024 0637   PHURINE 6.0 02/15/2024 0637   GLUCOSEU >=500 (A) 02/15/2024 0637   HGBUR NEGATIVE 02/15/2024 0637   BILIRUBINUR NEGATIVE 02/15/2024 0637   KETONESUR NEGATIVE 02/15/2024 0637   PROTEINUR NEGATIVE 02/15/2024 0637   NITRITE NEGATIVE 02/15/2024 0637   LEUKOCYTESUR NEGATIVE 02/15/2024 0637   Sepsis Labs: Invalid input(s): PROCALCITONIN, LACTICIDVEN  Microbiology: Recent Results (from the past 240 hours)  Blood Culture (routine x 2)     Status:  None   Collection Time: 02/15/24  1:55 AM   Specimen: BLOOD  Result Value Ref Range Status   Specimen Description BLOOD RIGHT ANTECUBITAL  Final   Special Requests   Final    BOTTLES DRAWN AEROBIC AND ANAEROBIC Blood Culture adequate volume   Culture   Final     NO GROWTH 5 DAYS Performed at [facility], [address] [place], [place] [address]    Report Status 02/20/2024 FINAL  Final  Blood Culture (routine x 2)     Status: None   Collection Time: 02/15/24  2:00 AM   Specimen: BLOOD  Result Value Ref Range Status   Specimen Description BLOOD LEFT ANTECUBITAL  Final   Special Requests   Final    BOTTLES DRAWN AEROBIC AND ANAEROBIC Blood Culture adequate volume   Culture   Final    NO GROWTH 5 DAYS Performed at [facility], [address], [place], [place] [address]    Report Status 02/20/2024 FINAL  Final  MRSA Next Gen by PCR, Nasal     Status: None   Collection Time: 02/15/24  4:22 AM   Specimen: Nasal Mucosa; Nasal Swab  Result Value Ref Range Status   MRSA by PCR Next Gen NOT DETECTED NOT DETECTED Final    Comment: (NOTE) The GeneXpert MRSA Assay (FDA approved for NASAL specimens only), is one component of a comprehensive MRSA colonization surveillance program. It is not intended to diagnose MRSA infection nor to guide or monitor treatment for MRSA infections. Test performance is not FDA approved in patients less than 56 years old. Performed at [facility], [address] [place], [place] [address]   Resp panel by RT-PCR (RSV, Flu A&B, Covid) Anterior Nasal Swab     Status: None   Collection Time: 02/20/24 11:02 PM   Specimen: Anterior Nasal Swab  Result Value Ref Range Status   SARS Coronavirus 2 by RT PCR NEGATIVE NEGATIVE Final   Influenza A by PCR NEGATIVE NEGATIVE Final   Influenza B by PCR NEGATIVE NEGATIVE Final    Comment: (NOTE) The Xpert Xpress SARS-CoV-2/FLU/RSV plus assay is intended as an aid in the diagnosis of influenza from Nasopharyngeal swab specimens and should not be used as a sole basis for treatment. Nasal washings and aspirates are unacceptable for Xpert Xpress SARS-CoV-2/FLU/RSV testing.  Fact Sheet for Patients: bloggercourse.com  Fact Sheet for  Healthcare Providers: seriousbroker.it  This test is not yet approved or cleared by the [place]  FDA and has been authorized for detection and/or diagnosis of SARS-CoV-2 by FDA under an Emergency Use Authorization (EUA). This EUA will remain in effect (meaning this test can be used) for the duration of the COVID-19 declaration under Section 564(b)(1) of the Act, 21 U.S.C. section 360bbb-3(b)(1), unless the authorization is terminated or revoked.     Resp Syncytial Virus by PCR NEGATIVE NEGATIVE Final    Comment: (NOTE) Fact Sheet for Patients: bloggercourse.com  Fact Sheet for Healthcare Providers: seriousbroker.it  This test is not yet approved or cleared by the [place]  FDA and has been authorized for detection and/or diagnosis of SARS-CoV-2 by FDA under an Emergency Use Authorization (EUA). This EUA will remain in effect (meaning this test can be used) for the duration of the COVID-19 declaration under Section 564(b)(1) of the Act, 21 U.S.C. section 360bbb-3(b)(1), unless the authorization is terminated or revoked.  Performed at [facility], [address] [place], [place] [address]   Blood  Culture (routine x 2)     Status: None (Preliminary result)   Collection Time: 02/20/24 11:02 PM   Specimen: BLOOD  Result Value Ref Range Status   Specimen Description BLOOD SITE NOT SPECIFIED  Final   Special Requests   Final    BOTTLES DRAWN AEROBIC AND ANAEROBIC Blood Culture adequate volume   Culture   Final    NO GROWTH 3 DAYS Performed at [facility], [address] [place], [place] [address]    Report Status PENDING  Incomplete  Blood Culture (routine x 2)     Status: None (Preliminary result)   Collection Time: 02/20/24 11:07 PM   Specimen: BLOOD  Result Value Ref Range Status   Specimen Description BLOOD RIGHT ANTECUBITAL  Final   Special Requests   Final    BOTTLES  DRAWN AEROBIC AND ANAEROBIC Blood Culture results may not be optimal due to an inadequate volume of blood received in culture bottles   Culture   Final    NO GROWTH 3 DAYS Performed at [facility], [address], [place], [place] [address]    Report Status PENDING  Incomplete  Respiratory (~20 pathogens) panel by PCR     Status: None   Collection Time: 02/21/24  6:00 AM   Specimen: Nasopharyngeal Swab; Respiratory  Result Value Ref Range Status   Adenovirus NOT DETECTED NOT DETECTED Final   Coronavirus 229E NOT DETECTED NOT DETECTED Final    Comment: (NOTE) The Coronavirus on the Respiratory Panel, DOES NOT test for the novel  Coronavirus (2019 nCoV)    Coronavirus HKU1 NOT DETECTED NOT DETECTED Final   Coronavirus NL63 NOT DETECTED NOT DETECTED Final   Coronavirus OC43 NOT DETECTED NOT DETECTED Final   Metapneumovirus NOT DETECTED NOT DETECTED Final   Rhinovirus / Enterovirus NOT DETECTED NOT DETECTED Final   Influenza A NOT DETECTED NOT DETECTED Final   Influenza B NOT DETECTED NOT DETECTED Final   Parainfluenza Virus 1 NOT DETECTED NOT DETECTED Final   Parainfluenza Virus 2 NOT DETECTED NOT DETECTED Final   Parainfluenza Virus 3 NOT DETECTED NOT DETECTED Final   Parainfluenza Virus 4 NOT DETECTED NOT DETECTED Final   Respiratory Syncytial Virus NOT DETECTED NOT DETECTED Final   Bordetella pertussis NOT DETECTED NOT DETECTED Final   Bordetella Parapertussis NOT DETECTED NOT DETECTED Final   Chlamydophila pneumoniae NOT DETECTED NOT DETECTED Final   Mycoplasma pneumoniae NOT DETECTED NOT DETECTED Final    Comment: Performed at [facility], [address], [place], [place] [address]    Radiology Studies: No results found.     Madisun Hargrove T. Vasilis Luhman [facility]  If 7PM-7AM, please contact night-coverage [email or web address] 02/24/2024, 12:52 PM   "

## 2024-02-24 NOTE — Consult Note (Signed)
 "                                                                                   Consultation Note Date: 02/24/2024   Patient Name: William Elliott  DOB: 12/31/1940  MRN: [id number]  Age / Sex: 83 y.o., male  PCP: [name], MD Referring Physician: Kathrin Mignon DASEN, MD  Reason for Consultation: Establishing goals of care  HPI/Patient Profile: 83 y.o. male  with past medical history of   admitted on 02/20/2024 with  .   Clinical Assessment and Goals of Care: 83 year old with past medical history of coronary artery disease CABG history of CVA with dysphagia and dysarthria, history of systolic CHF chronic A-fib hypertension dyslipidemia diverticulosis GI bleed Patient admitted to the hospital with generalized weakness shortness of breath low-grade fever multifocal pneumonia concerning for aspiration pneumonia Status post recent hospitalization for acute GI bleed secondary to diverticulosis Lives at home with wife and his son Evaluated by SLP who have recommended n.p.o., IR consulted for G-tube placement Palliative care for ongoing goals of care discussions Chart reviewed, patient seen and examined Also discussed with son [name] Palliative medicine is specialized medical care for people living with serious illness. It focuses on providing relief from the symptoms and stress of a serious illness. The goal is to improve quality of life for both the patient and the family. Goals of care: Broad aims of medical therapy in relation to the patient's values and preferences. Our aim is to provide medical care aimed at enabling patients to achieve the goals that matter most to them, given the circumstances of their particular medical situation and their constraints.  Brief life review performed.  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  They are hopeful that the patient will be able to go to and improve in physical strength and conditioning at his skilled nursing facility for rehabilitation attempt.  Introduced scope of palliative services.  CODE STATUS discussions undertaken in detail.  Patient wants attempt at resuscitation, however, he has instructed his son that if he were to become dependent on machines with then, at that time, the patient's son could elected for discontinuation/liberation from mechanical ventilation and to proceed with comfort measures at that time.  At present, goals are not in line with only comfort-focused care.  Patient states that he is still very much in the fight.   NEXT OF KIN Son [name] present at bedside  SUMMARY OF RECOMMENDATIONS   Full code PEG tube SNF rehab with palliative Spiritual care consult for [facility] documents completion.  Code Status/Advance Care Planning: Full code   Symptom Management:     Palliative Prophylaxis:  Frequent Pain Assessment    Psycho-social/Spiritual:  Desire  for further Chaplaincy support:yes Additional Recommendations: Caregiving  Support/Resources  Prognosis:  Unable to determine  Discharge Planning: Skilled Nursing Facility for rehab with Palliative care service follow-up      Primary Diagnoses: Present on Admission:  HCAP (healthcare-associated pneumonia)   I have reviewed the medical record, interviewed the patient and family, and examined the patient. The following aspects are pertinent.  Past Medical History:  Diagnosis Date   Acute lower GI bleeding 2008   Anxiety    Asthma    Carotid artery disease     Asymptomatic left carotid bruit   COPD (chronic obstructive pulmonary disease) (HCC)    Coronary artery disease    a. CABG - 1999 with LIMA-LAD, SVG-DIAG, SVG-OM, SVG-RPDA  b. Cath in setting of NSTEMI 03/18/2015: patent LIMA-LAD, occluded SVG-RCA, occluded SVG-D1, 99% stenosis of SVG-OM treated w/ DES   Depression    Dyslipidemia    Erectile dysfunction    Essential hypertension    History of blood transfusion 2008   History of stroke    Hyperlipidemia    Myocardial infarction [facility])    Social History   Socioeconomic History   Marital status: Married    Spouse name: Not on file   Number of children: Not on file   Years of education: Not on file   Highest education level: Not on file  Occupational History   Occupation: Welder x 10 yrs   Tobacco Use   Smoking status: Former    Types: Cigars    Quit date: 03/02/1997    Years since quitting: 27.0    Passive exposure: Never   Smokeless tobacco: Never   Tobacco comments:    smoked cigars x 10 yrs, never smoked cigarettes  Vaping Use   Vaping status: Never Used  Substance and Sexual Activity   Alcohol use: No    Alcohol/week: 0.0 standard drinks of alcohol    Comment: drank some when I was young; quit in the 1990s   Drug use: No   Sexual activity: Not Currently  Other Topics Concern   Not on file  Social History Narrative   Not on file   Social Drivers of Health   Tobacco Use: Medium Risk (02/20/2024)   Patient History    Smoking Tobacco Use: Former    Smokeless Tobacco Use: Never    Passive Exposure: Never  Physicist, Medical Strain: Low Risk (09/14/2023)   Overall Financial Resource Strain (CARDIA)    Difficulty of Paying Living Expenses: Not very hard  Food Insecurity: No Food Insecurity (02/21/2024)   Epic    Worried About Radiation Protection Practitioner of Food in the Last Year: Never true    Ran Out of Food in the Last Year: Never true  Transportation Needs: No Transportation Needs (02/21/2024)   Epic    Lack of Transportation  (Medical): No    Lack of Transportation (Non-Medical): No  Physical Activity: Not on file  Stress: Not on file  Social Connections: Moderately Integrated (02/21/2024)   Social Connection and Isolation Panel    Frequency of Communication with Friends and Family: Three times a week    Frequency of Social Gatherings with Friends and Family: Three times a week    Attends Religious Services: 1 to 4 times per year    Active Member of Clubs or Organizations: No    Attends Banker Meetings: Never    Marital Status: Married  Depression (PHQ2-9): Not on file  Alcohol Screen: Low Risk (09/14/2023)   Alcohol Screen  Last Alcohol Screening Score (AUDIT): 0  Housing: Low Risk (02/21/2024)   Epic    Unable to Pay for Housing in the Last Year: No    Number of Times Moved in the Last Year: 1    Homeless in the Last Year: No  Utilities: Not At Risk (02/21/2024)   Epic    Threatened with loss of utilities: No  Health Literacy: Not on file   Family History  Problem Relation Age of Onset   Heart disease Father    Heart attack Father    Hypertension Mother    Stroke Mother    Scheduled Meds:  ALPRAZolam   0.5 mg Oral Q8H   arformoterol   15 mcg Nebulization BID   And   umeclidinium bromide   1 puff Inhalation Daily   Bempedoic Acid -Ezetimibe   1 tablet Oral Daily   clotrimazole   10 mg Oral 5 X Daily   ferrous sulfate   325 mg Oral Q breakfast   mexiletine  200 mg Oral BID   pantoprazole   40 mg Oral Daily   Continuous Infusions:  ampicillin -sulbactam (UNASYN ) IV 3 g (02/24/24 1252)   dextrose  5% lactated ringers  with KCl 20 mEq/L 40 mL/hr at 02/24/24 0905   PRN Meds:.acetaminophen  **OR** acetaminophen , albuterol , ipratropium-albuterol , lip balm, magic mouthwash w/lidocaine , menthol , ondansetron  **OR** ondansetron  (ZOFRAN ) IV Medications Prior to Admission:  Prior to Admission medications  Medication Sig Start Date End Date Taking? Authorizing Provider  acetaminophen  (TYLENOL )  500 MG tablet Take 1,000 mg by mouth 2 (two) times daily as needed for moderate pain (pain score 4-6), fever or headache.   Yes [provider]  ALPRAZolam  (XANAX ) 1 MG tablet Take 1 tablet (1 mg total) by mouth at bedtime as needed for anxiety. Patient taking differently: Take 1 mg by mouth every 8 (eight) hours. 11/01/16  Yes Triplett, Tammy, PA-C  Ascorbic Acid  (VITAMIN C  PO) Take 1 tablet by mouth daily with breakfast.   Yes [provider]  aspirin  EC 81 MG tablet Take 1 tablet (81 mg total) by mouth daily. 02/12/17  Yes Debera Jayson MATSU, MD  B Complex-C (SUPER B COMPLEX PO) Take 1 capsule by mouth daily with breakfast.   Yes [provider]  CHELATED ZINC  PO Take 1 tablet by mouth daily with breakfast.   Yes [provider]  empagliflozin  (JARDIANCE ) 10 MG TABS tablet Take 1 tablet (10 mg total) by mouth daily. 10/07/23  Yes Hayes Beckey CROME, NP  Ferrous Sulfate  (IRON PO) Take 1 tablet by mouth daily with breakfast.   Yes [provider]  furosemide  (LASIX ) 20 MG tablet Take 4 tablets (80 mg total) by mouth daily. Patient taking differently: Take 40 mg by mouth See admin instructions. Take 1 tablet by mouth up to four times a day 11/12/23  Yes Lee, Jordan, NP  ipratropium-albuterol  (DUONEB) 0.5-2.5 (3) MG/3ML SOLN Take 3 mLs by nebulization every 4 (four) hours as needed (wheezing, shortness of breath). 03/11/23  Yes [provider]  mexiletine (MEXITIL ) 200 MG capsule TAKE ONE CAPSULE BY MOUTH TWICE DAILY 02/04/24  Yes Marcine Catalan M, PA-C  Multiple Vitamins-Minerals (MENS 50+ MULTIVITAMIN) TABS Take 1 tablet by mouth daily with breakfast.   Yes [provider]  NEXLIZET 180-10 MG TABS Take 1 tablet by mouth daily. 07/23/22  Yes [provider]  nystatin (MYCOSTATIN/NYSTOP) powder Apply 1 Application topically daily as needed (for dryness). 02/02/24  Yes [provider]  pantoprazole  (PROTONIX ) 40 MG tablet TAKE 1  TABLET BY MOUTH EVERY  DAY 02/09/19  Yes Debera Jayson MATSU, MD  Tiotropium Bromide -Olodaterol (STIOLTO RESPIMAT ) 2.5-2.5 MCG/ACT AERS Inhale 2 puffs into the lungs daily.   Yes [provider]  albuterol  (VENTOLIN  HFA) 108 (90 Base) MCG/ACT inhaler Inhale 2 puffs into the lungs every 6 (six) hours as needed for wheezing or shortness of breath.    [provider]  [Paused] amLODipine  (NORVASC ) 5 MG tablet Take 5 mg by mouth daily. Wait to take this until your doctor or other care provider tells you to start again. 02/02/24   [provider]  [Paused] isosorbide  mononitrate (IMDUR ) 30 MG 24 hr tablet Take 0.5 tablets (15 mg total) by mouth at bedtime. Wait to take this until your doctor or other care provider tells you to start again. 10/07/23   Hayes Beckey CROME, NP  [Paused] losartan  (COZAAR ) 25 MG tablet Take 0.5 tablets (12.5 mg total) by mouth daily. Wait to take this until your doctor or other care provider tells you to start again. 11/12/23   Lee, Jordan, NP  nitroGLYCERIN  (NITROSTAT ) 0.4 MG SL tablet DISSOLVE 1 TABLET UNDER THE TONGUE EVERY 5 MINUTES AS NEEDED FOR CHEST PAIN. DO NOT EXCEED 3 DOSES IN 15 MIN. CALL 911 IF NO RELIEF 04/19/23   Debera Jayson MATSU, MD  Omega-3 Fatty Acids (FISH OIL PO) Take 1 capsule by mouth daily with breakfast. Patient not taking: Reported on 02/21/2024    [provider]  potassium chloride  SA (KLOR-CON  M) 20 MEQ tablet Take 20 mEq by mouth daily. Take with Lasix  Patient not taking: Reported on 02/21/2024    [provider]  [Paused] spironolactone  (ALDACTONE ) 25 MG tablet Take 1 tablet (25 mg total) by mouth daily. Wait to take this until your doctor or other care provider tells you to start again. 10/14/23   Glena Harlene HERO, FNP   Allergies[1] Review of Systems +weakness  Physical Exam Patient is resting in bed Appears with generalized weakness Abdomen is soft Moves extremities Mood and affect within normal  limits Regular work of breathing  Vital Signs: BP (!) 150/63   Pulse 64   Temp 98.3 F (36.8 C) (Oral)   Resp 15   Ht 5' 10 (1.778 m)   Wt 71.4 kg   SpO2 100%   BMI 22.59 kg/m  Pain Scale: 0-10   Pain Score: 0-No pain   SpO2: SpO2: 100 % O2 Device:SpO2: 100 % O2 Flow Rate: .   IO: Intake/output summary:  Intake/Output Summary (Last 24 hours) at 02/24/2024 1418 Last data filed at 02/24/2024 0437 Gross per 24 hour  Intake 200 ml  Output 700 ml  Net -500 ml    LBM: Last BM Date : 02/22/24 Baseline Weight: Weight: 71.6 kg Most recent weight: Weight: 71.4 kg     Palliative Assessment/Data:   PPS 40%  Time In:  1300 Time Out:  1415 Time Total:  75 Greater than 50%  of this time was spent counseling and coordinating care related to the above assessment and plan.  Signed by: Lonia Serve, MD   Please contact Palliative Medicine Team phone at [phone] for questions and concerns.  For individual provider: See Amion                 [1]  Allergies Allergen Reactions   Anoro Ellipta  [Umeclidinium-Vilanterol] Other (See Comments)    Cardiac problems   Sulfites Shortness Of Breath and Anxiety   Lipitor [Atorvastatin Calcium ] Other (See Comments)    Unknown reaction   Niaspan [  Niacin] Other (See Comments)    Unknown reaction   Tricor [Fenofibrate] Other (See Comments)    Fatigue    Zocor [Simvastatin] Other (See Comments)    Unknown reaction   "

## 2024-02-24 NOTE — Plan of Care (Signed)
   Problem: Education: Goal: Knowledge of General Education information will improve Description Including pain rating scale, medication(s)/side effects and non-pharmacologic comfort measures Outcome: Progressing

## 2024-02-24 NOTE — Consult Note (Signed)
 "   Chief Complaint: Dysphagia, prior stroke, aspiration pneumonia, weakness; referred for percutaneous gastrostomy tube placement  Referring Provider(s): Gonfa,T  Supervising Physician: Jenna Hacker  Patient Status: [facility] - In-pt  History of Present Illness: William Elliott is an 83 y.o. male with past medical history significant for GI bleed 2008, anxiety, asthma, carotid artery disease, COPD, coronary artery disease with prior CABG, non-STEMI, depression, hyperlipidemia, hypertension, CHF, chronic A-fib, diverticulosis CVA with associated dysphagia/dysarthria who was recently admitted to [facility] with generalized weakness, dyspnea, low-grade fever, multifocal pneumonia concerning for aspiration pneumonia.  Also recently hospitalized for acute GI bleed due to diverticulosis, treated for pneumonia and discharged home.  Patient has been evaluated by SLP who recommended NPO.  Request now received for percutaneous gastrostomy tube placement.  Patient also noted to have a nonobstructing partial Schatzki ring in distal esophagus on recent endoscopy.  *** Patient is Full Code  Past Medical History:  Diagnosis Date   Acute lower GI bleeding 2008   Anxiety    Asthma    Carotid artery disease    Asymptomatic left carotid bruit   COPD (chronic obstructive pulmonary disease) (HCC)    Coronary artery disease    a. CABG - 1999 with LIMA-LAD, SVG-DIAG, SVG-OM, SVG-RPDA  b. Cath in setting of NSTEMI 03/18/2015: patent LIMA-LAD, occluded SVG-RCA, occluded SVG-D1, 99% stenosis of SVG-OM treated w/ DES   Depression    Dyslipidemia    Erectile dysfunction    Essential hypertension    History of blood transfusion 2008   History of stroke    Hyperlipidemia    Myocardial infarction [facility])     Past Surgical History:  Procedure Laterality Date   CARDIAC CATHETERIZATION     CARDIAC CATHETERIZATION N/A 03/18/2015   Procedure: Left Heart Cath and Cors/Grafts Angiography;  Surgeon: Alm LELON Clay, MD;  Location: [facility] INVASIVE CV LAB;  Service: Cardiovascular;  Laterality: N/A;   CARDIAC CATHETERIZATION N/A 03/18/2015   Procedure: Coronary Stent Intervention;  Surgeon: Alm LELON Clay, MD;  Location: [facility] INVASIVE CV LAB;  Service: Cardiovascular;  Laterality: N/A;   CATARACT EXTRACTION W/ INTRAOCULAR LENS  IMPLANT, BILATERAL Bilateral    COLONOSCOPY N/A 02/16/2024   Procedure: COLONOSCOPY;  Surgeon: Albertus Gordy HERO, MD;  Location: [facility] ENDOSCOPY;  Service: Gastroenterology;  Laterality: N/A;   CORONARY ANGIOPLASTY     CORONARY ARTERY BYPASS GRAFT  1999   CORONARY STENT INTERVENTION N/A 04/22/2020   Procedure: CORONARY STENT INTERVENTION;  Surgeon: Claudene Victory LELON, MD;  Location: [facility] INVASIVE CV LAB;  Service: Cardiovascular;  Laterality: N/A;   ESOPHAGOGASTRODUODENOSCOPY N/A 02/16/2024   Procedure: EGD (ESOPHAGOGASTRODUODENOSCOPY);  Surgeon: Albertus Gordy HERO, MD;  Location: [facility] ENDOSCOPY;  Service: Gastroenterology;  Laterality: N/A;   FRACTURE SURGERY     HERNIA REPAIR     HOT HEMOSTASIS N/A 02/16/2024   Procedure: COLONOSCOPY, WITH ARGON PLASMA COAGULATION;  Surgeon: Albertus Gordy HERO, MD;  Location: [facility] ENDOSCOPY;  Service: Gastroenterology;  Laterality: N/A;   LEFT HEART CATH AND CORS/GRAFTS ANGIOGRAPHY N/A 04/22/2020   Procedure: LEFT HEART CATH AND CORS/GRAFTS ANGIOGRAPHY;  Surgeon: Claudene Victory LELON, MD;  Location: [facility] INVASIVE CV LAB;  Service: Cardiovascular;  Laterality: N/A;   TIBIA FRACTURE SURGERY Left [facility]; took bone out of my right hip   TONSILLECTOMY     UMBILICAL HERNIA REPAIR  ~ 2000    Allergies: Anoro ellipta  [umeclidinium-vilanterol], Sulfites, Lipitor [atorvastatin calcium ], Niaspan [niacin], Tricor [fenofibrate], and Zocor [simvastatin]  Medications: Prior to  Admission medications  Medication Sig Start Date End Date Taking? Authorizing Provider  acetaminophen  (TYLENOL ) 500 MG tablet Take 1,000 mg by mouth 2 (two) times daily as needed for moderate pain  (pain score 4-6), fever or headache.   Yes [provider]  ALPRAZolam  (XANAX ) 1 MG tablet Take 1 tablet (1 mg total) by mouth at bedtime as needed for anxiety. Patient taking differently: Take 1 mg by mouth every 8 (eight) hours. 11/01/16  Yes Triplett, Tammy, PA-C  Ascorbic Acid  (VITAMIN C  PO) Take 1 tablet by mouth daily with breakfast.   Yes [provider]  aspirin  EC 81 MG tablet Take 1 tablet (81 mg total) by mouth daily. 02/12/17  Yes Debera Jayson MATSU, MD  B Complex-C (SUPER B COMPLEX PO) Take 1 capsule by mouth daily with breakfast.   Yes [provider]  CHELATED ZINC  PO Take 1 tablet by mouth daily with breakfast.   Yes [provider]  empagliflozin  (JARDIANCE ) 10 MG TABS tablet Take 1 tablet (10 mg total) by mouth daily. 10/07/23  Yes Hayes Beckey CROME, NP  Ferrous Sulfate  (IRON PO) Take 1 tablet by mouth daily with breakfast.   Yes [provider]  furosemide  (LASIX ) 20 MG tablet Take 4 tablets (80 mg total) by mouth daily. Patient taking differently: Take 40 mg by mouth See admin instructions. Take 1 tablet by mouth up to four times a day 11/12/23  Yes Lee, Jordan, NP  ipratropium-albuterol  (DUONEB) 0.5-2.5 (3) MG/3ML SOLN Take 3 mLs by nebulization every 4 (four) hours as needed (wheezing, shortness of breath). 03/11/23  Yes [provider]  mexiletine (MEXITIL ) 200 MG capsule TAKE ONE CAPSULE BY MOUTH TWICE DAILY 02/04/24  Yes Marcine Catalan M, PA-C  Multiple Vitamins-Minerals (MENS 50+ MULTIVITAMIN) TABS Take 1 tablet by mouth daily with breakfast.   Yes [provider]  NEXLIZET 180-10 MG TABS Take 1 tablet by mouth daily. 07/23/22  Yes [provider]  nystatin (MYCOSTATIN/NYSTOP) powder Apply 1 Application topically daily as needed (for dryness). 02/02/24  Yes [provider]  pantoprazole  (PROTONIX ) 40 MG tablet TAKE 1 TABLET BY MOUTH EVERY DAY 02/09/19  Yes Debera Jayson MATSU, MD  Tiotropium  Bromide-Olodaterol (STIOLTO RESPIMAT ) 2.5-2.5 MCG/ACT AERS Inhale 2 puffs into the lungs daily.   Yes [provider]  albuterol  (VENTOLIN  HFA) 108 (90 Base) MCG/ACT inhaler Inhale 2 puffs into the lungs every 6 (six) hours as needed for wheezing or shortness of breath.    [provider]  [Paused] amLODipine  (NORVASC ) 5 MG tablet Take 5 mg by mouth daily. Wait to take this until your doctor or other care provider tells you to start again. 02/02/24   [provider]  [Paused] isosorbide  mononitrate (IMDUR ) 30 MG 24 hr tablet Take 0.5 tablets (15 mg total) by mouth at bedtime. Wait to take this until your doctor or other care provider tells you to start again. 10/07/23   Hayes Beckey CROME, NP  [Paused] losartan  (COZAAR ) 25 MG tablet Take 0.5 tablets (12.5 mg total) by mouth daily. Wait to take this until your doctor or other care provider tells you to start again. 11/12/23   Lee, Jordan, NP  nitroGLYCERIN  (NITROSTAT ) 0.4 MG SL tablet DISSOLVE 1 TABLET UNDER THE TONGUE EVERY 5 MINUTES AS NEEDED FOR CHEST PAIN. DO NOT EXCEED 3 DOSES IN 15 MIN. CALL 911 IF NO RELIEF 04/19/23   Debera Jayson MATSU, MD  Omega-3 Fatty Acids (FISH OIL PO) Take 1 capsule by mouth daily with  breakfast. Patient not taking: Reported on 02/21/2024    [provider]  potassium chloride  SA (KLOR-CON  M) 20 MEQ tablet Take 20 mEq by mouth daily. Take with Lasix  Patient not taking: Reported on 02/21/2024    [provider]  [Paused] spironolactone  (ALDACTONE ) 25 MG tablet Take 1 tablet (25 mg total) by mouth daily. Wait to take this until your doctor or other care provider tells you to start again. 10/14/23   Mil[place], Harlene HERO, FNP     Family History  Problem Relation Age of Onset   Heart disease Father    Heart attack Father    Hypertension Mother    Stroke Mother     Social History   Socioeconomic History   Marital status: Married    Spouse name: Not on file   Number of children: Not  on file   Years of education: Not on file   Highest education level: Not on file  Occupational History   Occupation: Welder x 10 yrs   Tobacco Use   Smoking status: Former    Types: Cigars    Quit date: 03/02/1997    Years since quitting: 27.0    Passive exposure: Never   Smokeless tobacco: Never   Tobacco comments:    smoked cigars x 10 yrs, never smoked cigarettes  Vaping Use   Vaping status: Never Used  Substance and Sexual Activity   Alcohol use: No    Alcohol/week: 0.0 standard drinks of alcohol    Comment: drank some when I was young; quit in the 1990s   Drug use: No   Sexual activity: Not Currently  Other Topics Concern   Not on file  Social History Narrative   Not on file   Social Drivers of Health   Tobacco Use: Medium Risk (02/20/2024)   Patient History    Smoking Tobacco Use: Former    Smokeless Tobacco Use: Never    Passive Exposure: Never  Physicist, Medical Strain: Low Risk (09/14/2023)   Overall Financial Resource Strain (CARDIA)    Difficulty of Paying Living Expenses: Not very hard  Food Insecurity: No Food Insecurity (02/21/2024)   Epic    Worried About Radiation Protection Practitioner of Food in the Last Year: Never true    Ran Out of Food in the Last Year: Never true  Transportation Needs: No Transportation Needs (02/21/2024)   Epic    Lack of Transportation (Medical): No    Lack of Transportation (Non-Medical): No  Physical Activity: Not on file  Stress: Not on file  Social Connections: Moderately Integrated (02/21/2024)   Social Connection and Isolation Panel    Frequency of Communication with Friends and Family: Three times a week    Frequency of Social Gatherings with Friends and Family: Three times a week    Attends Religious Services: 1 to 4 times per year    Active Member of Clubs or Organizations: No    Attends Banker Meetings: Never    Marital Status: Married  Depression (PHQ2-9): Not on file  Alcohol Screen: Low Risk (09/14/2023)   Alcohol  Screen    Last Alcohol Screening Score (AUDIT): 0  Housing: Low Risk (02/21/2024)   Epic    Unable to Pay for Housing in the Last Year: No    Number of Times Moved in the Last Year: 1    Homeless in the Last Year: No  Utilities: Not At Risk (02/21/2024)   Epic    Threatened with loss of utilities: No  Health Literacy: Not on file       Review of Systems  Vital Signs: BP (!) 150/63   Pulse 64   Temp 98.3 F (36.8 C) (Oral)   Resp 15   Ht 5' 10 (1.778 m)   Wt 157 lb 6.5 oz (71.4 kg)   SpO2 100%   BMI 22.59 kg/m   Advance Care Plan: no documents on file   Physical Exam  Imaging: DG Swallowing Func-Speech Pathology Result Date: 02/23/2024 Table formatting from the original result was not included. Images from the original result were not included. Modified Barium Swallow Study Patient Details Name: William Elliott MRN: [id number] Date of Birth: [birth year] Today's Date: 02/23/2024 HPI/PMH: HPI: Mr. Cammarata is an 83 yo male with recent admission 12/15-12/20 for acute GIB complicated by ABLA with hemorrhagic shock. Pt was also dx acute on chronic CHF exacerbation and PNA. 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 Pharyngeal residue and larygneal penetration were noted across consistencies, as well as aspiration of liquids. Pt returned to the hospital on 12/21 due to acute shortness of breath and fever. Pt admitted with concern for PNA and also started on tx for thrush. PMH also includes:  CAD s/p CABG, chronic A-fib (not on anticoagulation d/t previous GIB), HTN, HLD, prior CVA (2017), COPD Clinical Impression: Rec: continue NPO, oral meds as per MD recommendation,  He would benefit from GOC discussion with medical care team and palliative care consult. SLP will continue to follow. William Elliott presents with a severe oropharyngeal dysphagia as per this MBS. As compared to MBS on 12/19, his epiglottis appears edematous (see image) and he  exhibited an increase in pharyngeal residuals, specifically posterior pharyngeal wall residuals. Today's study focused on trial of various strategies to determine their effectiveness with patient's swallow function and airway protection. There was no observed benefit to reclined posture, posterior head tilt, right head turn or left head turn. PO's of thin liquid, nectar thick liquid and puree solids were used during this MBS. Suspected cervical osteophytes prevent epiglottic inversion and osteophytes further down cervical spine impacted bolus transit through upper esopahagus. Patient with aspiration of gross amount of thin liquids prior to swallow initiation which was not immediately sensed. (PAS 8) as well as aspiration during and after the swallow with thin and nectar thick liquids from residuals spilling over arytenoids into airway. DIGEST Swallow Severity Rating*  Safety: 4  Efficiency:3  Overall Pharyngeal Swallow Severity: 4 1: mild; 2: moderate; 3: severe; 4: profound *The Dynamic Imaging Grade of Swallowing Toxicity is standardized for the head and neck cancer population, however, demonstrates promising clinical applications across populations to standardize the clinical rating of pharyngeal swallow safety and severity. Factors that may increase risk of adverse event in presence of aspiration Noe & Lianne 2021): Factors that may increase risk of adverse event in presence of aspiration Noe & Lianne 2021): Poor general health and/or compromised immunity; Frequent aspiration of large volumes; Weak cough; Limited mobility; Frail or deconditioned Recommendations/Plan: Swallowing Evaluation Recommendations Swallowing Evaluation Recommendations Recommendations: NPO Medication Administration: Other (Comment) (per MD recommendations) Oral care recommendations: Oral care QID (4x/day) Recommended consults: Consider Palliative care Caregiver Recommendations: Have oral suction available Treatment Plan Treatment  Plan Treatment recommendations: Therapy as outlined in treatment plan below Follow-up recommendations: Follow physicians's recommendations for discharge plan and follow up therapies Functional status assessment: Patient has had a recent decline in their functional status and demonstrates the ability to make significant improvements in function in a  reasonable and predictable amount of time. Treatment frequency: Min 2x/week Treatment duration: 1 week Interventions: Aspiration precaution training; Patient/family education; Trials of upgraded texture/liquids; Respiratory muscle strength training Recommendations Recommendations for follow up therapy are one component of a multi-disciplinary discharge planning process, led by the attending physician.  Recommendations may be updated based on patient status, additional functional criteria and insurance authorization. Assessment: Orofacial Exam: Orofacial Exam Oral Cavity: Oral Hygiene: WFL Oral Cavity - Dentition: Adequate natural dentition Orofacial Anatomy: WFL Oral Motor/Sensory Function: WFL Anatomy: Anatomy: Suspected cervical osteophytes Boluses Administered: Boluses Administered Boluses Administered: Thin liquids (Level 0); Puree; Mildly thick liquids (Level 2, nectar thick)  Oral Impairment Domain: Oral Impairment Domain Lip Closure: No labial escape Tongue control during bolus hold: Not tested Location of oral residue : Tongue Initiation of pharyngeal swallow : Pyriform sinuses  Pharyngeal Impairment Domain: Pharyngeal Impairment Domain Soft palate elevation: No bolus between soft palate (SP)/pharyngeal wall (PW) Laryngeal elevation: Partial superior movement of thyroid  cartilage/partial approximation of arytenoids to epiglottic petiole Anterior hyoid excursion: Partial anterior movement Epiglottic movement: No inversion Laryngeal vestibule closure: Incomplete, narrow column air/contrast in laryngeal vestibule Pharyngoesophageal segment opening: Minimal  distention/minimal duration, marked obstruction of flow Pharyngeal residue: Majority of contrast within or on pharyngeal structures Location of pharyngeal residue: Valleculae; Tongue base; Pharyngeal wall; Pyriform sinuses; Diffuse (>3 areas)  Esophageal Impairment Domain: No data recorded Pill: No data recorded Penetration/Aspiration Scale Score: Penetration/Aspiration Scale Score 1.  Material does not enter airway: Puree 8.  Material enters airway, passes BELOW cords without attempt by patient to eject out (silent aspiration) : Thin liquids (Level 0); Mildly thick liquids (Level 2, nectar thick) Compensatory Strategies: Compensatory Strategies Compensatory strategies: Yes Left head turn: Ineffective Ineffective Left Head Turn: Thin liquid (Level 0) Right head turn: Ineffective Ineffective Right Head Turn: Thin liquid (Level 0) Reclining posture: Ineffective Ineffective Reclining Posture: Thin liquid (Level 0) Posterior head tilt: Ineffective Ineffective Posterior head tilt: Thin liquid (Level 0)   General Information: Caregiver present: No  Diet Prior to this Study: NPO   Temperature : Normal   Respiratory Status: WFL   Supplemental O2: None (Room air)   History of Recent Intubation: No  Behavior/Cognition: Alert; Cooperative; Pleasant mood Self-Feeding Abilities: Able to self-feed Baseline vocal quality/speech: Dysphonic Volitional Cough: Able to elicit Volitional Swallow: Able to elicit Exam Limitations: No limitations Goal Planning: Prognosis for improved oropharyngeal function: Guarded Barriers to Reach Goals: Severity of deficits; Time post onset No data recorded Patient/Family Stated Goal: patient verbalized that he did not want a feeding tube, but I'll get one if I need to Consulted and agree with results and recommendations: Patient Pain: Pain Assessment Pain Assessment: No/denies pain Breathing: 0 Negative Vocalization: 0 Body Language: 0 Consolability: 0 End of Session: Start Time:SLP Start Time (ACUTE  ONLY): 1108 Stop Time: SLP Stop Time (ACUTE ONLY): 1123 Time Calculation:SLP Time Calculation (min) (ACUTE ONLY): 15 min Charges: SLP Evaluations $ SLP Speech Visit: 1 Visit SLP Evaluations $MBS Swallow: 1 Procedure $Swallowing Treatment: 1 Procedure SLP visit diagnosis: SLP Visit Diagnosis: Dysphagia, oropharyngeal phase (R13.12) Past Medical History: Past Medical History: Diagnosis Date  Acute lower GI bleeding 2008  Anxiety   Asthma   Carotid artery disease   Asymptomatic left carotid bruit  COPD (chronic obstructive pulmonary disease) (HCC)   Coronary artery disease   a. CABG - 1999 with LIMA-LAD, SVG-DIAG, SVG-OM, SVG-RPDA  b. Cath in setting of NSTEMI 03/18/2015: patent LIMA-LAD, occluded SVG-RCA, occluded SVG-D1, 99% stenosis of SVG-OM treated w/  DES  Depression   Dyslipidemia   Erectile dysfunction   Essential hypertension   History of blood transfusion 2008  History of stroke   Hyperlipidemia   Myocardial infarction [facility])  Past Surgical History: Past Surgical History: Procedure Laterality Date  CARDIAC CATHETERIZATION    CARDIAC CATHETERIZATION N/A 03/18/2015  Procedure: Left Heart Cath and Cors/Grafts Angiography;  Surgeon: Alm LELON Clay, MD;  Location: [facility] INVASIVE CV LAB;  Service: Cardiovascular;  Laterality: N/A;  CARDIAC CATHETERIZATION N/A 03/18/2015  Procedure: Coronary Stent Intervention;  Surgeon: Alm LELON Clay, MD;  Location: [facility] INVASIVE CV LAB;  Service: Cardiovascular;  Laterality: N/A;  CATARACT EXTRACTION W/ INTRAOCULAR LENS  IMPLANT, BILATERAL Bilateral   COLONOSCOPY N/A 02/16/2024  Procedure: COLONOSCOPY;  Surgeon: Albertus Gordy HERO, MD;  Location: [facility] ENDOSCOPY;  Service: Gastroenterology;  Laterality: N/A;  CORONARY ANGIOPLASTY    CORONARY ARTERY BYPASS GRAFT  1999  CORONARY STENT INTERVENTION N/A 04/22/2020  Procedure: CORONARY STENT INTERVENTION;  Surgeon: Claudene Victory LELON, MD;  Location: [facility] INVASIVE CV LAB;  Service: Cardiovascular;  Laterality: N/A;  ESOPHAGOGASTRODUODENOSCOPY N/A 02/16/2024   Procedure: EGD (ESOPHAGOGASTRODUODENOSCOPY);  Surgeon: Albertus Gordy HERO, MD;  Location: [facility] ENDOSCOPY;  Service: Gastroenterology;  Laterality: N/A;  FRACTURE SURGERY    HERNIA REPAIR    HOT HEMOSTASIS N/A 02/16/2024  Procedure: COLONOSCOPY, WITH ARGON PLASMA COAGULATION;  Surgeon: Albertus Gordy HERO, MD;  Location: [facility] ENDOSCOPY;  Service: Gastroenterology;  Laterality: N/A;  LEFT HEART CATH AND CORS/GRAFTS ANGIOGRAPHY N/A 04/22/2020  Procedure: LEFT HEART CATH AND CORS/GRAFTS ANGIOGRAPHY;  Surgeon: Claudene Victory LELON, MD;  Location: [facility] INVASIVE CV LAB;  Service: Cardiovascular;  Laterality: N/A;  TIBIA FRACTURE SURGERY Left [facility]; took bone out of my right hip  TONSILLECTOMY    UMBILICAL HERNIA REPAIR  ~ 2000 William Elliott Blase, MA, CCC-SLP Speech Therapy 02/23/2024, 1:24 PM  CT Angio Chest PE W and/or Wo Contrast Result Date: 02/21/2024 CLINICAL DATA:  Shortness of breath EXAM: CT ANGIOGRAPHY CHEST WITH CONTRAST TECHNIQUE: Multidetector CT imaging of the chest was performed using the standard protocol during bolus administration of intravenous contrast. Multiplanar CT image reconstructions and MIPs were obtained to evaluate the vascular anatomy. RADIATION DOSE REDUCTION: This exam was performed according to the departmental dose-optimization program which includes automated exposure control, adjustment of the mA and/or kV according to patient size and/or use of iterative reconstruction technique. CONTRAST:  75mL OMNIPAQUE  IOHEXOL  350 MG/ML SOLN COMPARISON:  Chest x-ray from the previous day FINDINGS: Cardiovascular: Atherosclerotic calcifications of the thoracic aorta are noted. Changes of prior coronary bypass grafting are seen. The pulmonary artery shows a normal branching pattern bilaterally. No filling defect to suggest pulmonary embolism is noted. Coronary calcifications are noted. Heart is mildly. Mediastinum/Nodes: Esophagus is within normal limits. No hilar or mediastinal adenopathy is noted. The  thoracic inlet is unremarkable. Lungs/Pleura: Lungs show patchy bibasilar infiltrate with associated small effusions consistent with multifocal pneumonia. Upper Abdomen: Gallbladder has been surgically removed. Musculoskeletal: No chest wall abnormality. No acute or significant osseous findings. Review of the MIP images confirms the above findings. IMPRESSION: No evidence of pulmonary emboli. Bibasilar infiltrates consistent with multifocal pneumonia. Aortic Atherosclerosis (ICD10-I70.0). Electronically Signed   By: Oneil Devonshire M.D.   On: 02/21/2024 01:59   DG Chest Portable 1 View Result Date: 02/20/2024 CLINICAL DATA:  Shortness of breath EXAM: PORTABLE CHEST 1 VIEW COMPARISON:  02/18/2024, 02/15/2024, 01/20/2023 FINDINGS: Post sternotomy changes. Cardiomegaly with vascular congestion. Patchy bilateral pulmonary opacities either reflecting mild edema or diffuse  pneumonia. Suspicion of small pleural effusions. Aortic atherosclerosis. Not much interval change compared with radiograph 2 days prior. IMPRESSION: Cardiomegaly with vascular congestion. Patchy bilateral pulmonary opacities either reflecting mild edema or diffuse pneumonia. Suspicion of small pleural effusions. Electronically Signed   By: Luke Bun M.D.   On: 02/20/2024 23:36   DG Swallowing Func-Speech Pathology Result Date: 02/18/2024 Table formatting from the original result was not included. Modified Barium Swallow Study Patient Details Name: William Elliott MRN: [id number] Date of Birth: 1940/08/06 Today's Date: 02/18/2024 HPI/PMH: HPI: 83 yo male presenting to ED 12/15 for GIB. S/p EGD and colonoscopy 12/17. CXR 12/18 shows hazy and reticular opacities bilaterally, more pronounced in the lung bases, possibly reflecting alveolar and interstitial edema or pneumonitis. PMH includes CAD s/p CABG, chronic A-fib (not on anticoagulation d/t previous GIB), HTN, HLD, prior CVA (2017), COPD. BSE indicated need for MBS d/t overt s/s of aspiration  during evaluation. Clinical Impression: Clinical Impression: Recommend Dysphagia 3/thin liquids with esophageal and swallowing precautions in place including multiple sips, repetititve swallows, liquid wash and throat clearing prn intermittently during meals. Medications may be beneficial with small sips of thin liquids and/or in puree/whole. ST will f/u for dysphagia tx/management in acute setting. Recommend ST f/u at next venue for dysphagia tx.   Pt presents with mild-moderate oropharyngeal dysphagia/mild-mod pharyngoesophageal dysphagia c/b decreased tongue control resulting in posterior escape of less than half of the bolus with thin and slow, prolonged mastication with complete recollection.  Delayed initiation of tongue motion with trace residue lining oral structures.  Swallow triggered at the level of the valleculae.  Partial anterior hyoid movement and decreased epiglottic inversion due to presence of cervical osteophytes with mild-moderate vallecular/posterior pharyngeal wall residue requiring multiple swallows, liquid wash and effortful swallow in conjunction to clear material. Pt did exhibit penetration and/or aspiration with residue with incomplete narrow column of air and contrast within the laryngeal vestibule.  Pharyngeal stripping wave diminished.  Partial distension/duration and partial obstruction of flow with PES opening.  Tongue base decreased resulting in narrow column of contrast between PPW and tongue base.  SLight esophageal retention initially, but improved as study progressed. ST will f/u in acute setting for dysphagia tx/education. DIGEST Swallow Severity Rating*  Safety: 2  Efficiency:2  Overall Pharyngeal Swallow Severity: moderate 1: mild; 2: moderate; 3: severe; 4: profound *The Dynamic Imaging Grade of Swallowing Toxicity is standardized for the head and neck cancer population, however, demonstrates promising clinical applications across populations to standardize the clinical  rating of pharyngeal swallow safety and severity. Factors that may increase risk of adverse event in presence of aspiration Noe & Lianne 2021): Factors that may increase risk of adverse event in presence of aspiration Noe & Lianne 2021): Poor general health and/or compromised immunity; Reduced cognitive function; Frail or deconditioned; Aspiration of thick, dense, and/or acidic materials; Frequent aspiration of large volumes Recommendations/Plan: Swallowing Evaluation Recommendations Swallowing Evaluation Recommendations Recommendations: PO diet PO Diet Recommendation: Dysphagia 3 (Mechanical soft); Thin liquids (Level 0) Liquid Administration via: Cup Medication Administration: Whole meds with puree (or liquids if small sips) Supervision: Staff to assist with self-feeding; Intermittent supervision/cueing for swallowing strategies Swallowing strategies  : Slow rate; Small bites/sips; effortful swallow; Multiple dry swallows after each bite/sip; Follow solids with liquids Postural changes: Position pt fully upright for meals; Stay upright 30-60 min after meals Oral care recommendations: Oral care BID (2x/day) Treatment Plan Treatment Plan Treatment recommendations: Therapy as outlined in treatment plan below Follow-up recommendations: Other (comment) (SLP f/u  at next venue of care) Functional status assessment: Patient has had a recent decline in their functional status and demonstrates the ability to make significant improvements in function in a reasonable and predictable amount of time. Treatment frequency: Min 2x/week Treatment duration: 1 week Interventions: Aspiration precaution training; Patient/family education; Trials of upgraded texture/liquids; Diet toleration management by SLP Recommendations Recommendations for follow up therapy are one component of a multi-disciplinary discharge planning process, led by the attending physician.  Recommendations may be updated based on patient status,  additional functional criteria and insurance authorization. Assessment: Orofacial Exam: Orofacial Exam Oral Cavity: Oral Hygiene: WFL Oral Cavity - Dentition: Adequate natural dentition Orofacial Anatomy: WFL Oral Motor/Sensory Function: WFL Anatomy: Anatomy: Suspected cervical osteophytes Boluses Administered: Boluses Administered Boluses Administered: Thin liquids (Level 0); Mildly thick liquids (Level 2, nectar thick); Moderately thick liquids (Level 3, honey thick); Puree; Solid  Oral Impairment Domain: Oral Impairment Domain Lip Closure: No labial escape Tongue control during bolus hold: Posterior escape of less than half of bolus Bolus preparation/mastication: Slow prolonged chewing/mashing with complete recollection Bolus transport/lingual motion: Delayed initiation of tongue motion (oral holding) Oral residue: Trace residue lining oral structures Location of oral residue : Tongue Initiation of pharyngeal swallow : Valleculae  Pharyngeal Impairment Domain: Pharyngeal Impairment Domain Soft palate elevation: No bolus between soft palate (SP)/pharyngeal wall (PW) Laryngeal elevation: Complete superior movement of thyroid  cartilage with complete approximation of arytenoids to epiglottic petiole Anterior hyoid excursion: Partial anterior movement Epiglottic movement: Partial inversion Laryngeal vestibule closure: Incomplete, narrow column air/contrast in laryngeal vestibule Pharyngeal stripping wave : Present - diminished Pharyngeal contraction (A/P view only): N/A Pharyngoesophageal segment opening: Partial distention/partial duration, partial obstruction of flow Tongue base retraction: Narrow column of contrast or air between tongue base and PPW Pharyngeal residue: Collection of residue within or on pharyngeal structures Location of pharyngeal residue: Valleculae; Pharyngeal wall  Esophageal Impairment Domain: Esophageal Impairment Domain Esophageal clearance upright position: Esophageal retention Pill: Pill  Consistency administered: Thin liquids (Level 0) Thin liquids (Level 0): Impaired (see clinical impressions) Penetration/Aspiration Scale Score: Penetration/Aspiration Scale Score 1.  Material does not enter airway: Puree; Solid 2.  Material enters airway, remains ABOVE vocal cords then ejected out: Thin liquids (Level 0); Mildly thick liquids (Level 2, nectar thick) 3.  Material enters airway, remains ABOVE vocal cords and not ejected out: Thin liquids (Level 0) 4.  Material enters airway, CONTACTS cords then ejected out: Thin liquids (Level 0); Mildly thick liquids (Level 2, nectar thick) 5.  Material enters airway, CONTACTS cords and not ejected out: Thin liquids (Level 0); Moderately thick liquids (Level 3, honey thick) Compensatory Strategies: Compensatory Strategies Compensatory strategies: Yes Effortful swallow: Effective Effective Effortful Swallow: Thin liquid (Level 0); Puree Multiple swallows: Effective Effective Multiple Swallows: Thin liquid (Level 0); Puree; Solid Liquid wash: Effective Effective Liquid Wash: Thin liquid (Level 0); Puree; Solid   General Information: Caregiver present: No  Diet Prior to this Study: Regular; Thin liquids (Level 0)   Temperature : Normal   Respiratory Status: WFL   Supplemental O2: None (Room air)   History of Recent Intubation: No  Behavior/Cognition: Alert; Cooperative Self-Feeding Abilities: Able to self-feed Baseline vocal quality/speech: Dysphonic Volitional Cough: Able to elicit Volitional Swallow: Able to elicit Exam Limitations: No limitations Goal Planning: Prognosis for improved oropharyngeal function: Good No data recorded No data recorded Patient/Family Stated Goal: none stated Consulted and agree with results and recommendations: Patient; Family member/caregiver Pain: Pain Assessment Pain Assessment: No/denies pain End of Session: Start Time:SLP  Start Time (ACUTE ONLY): [id number] Stop Time: SLP Stop Time (ACUTE ONLY): 1000 Time Calculation:SLP Time Calculation  (min) (ACUTE ONLY): 35 min Charges: SLP Evaluations $ SLP Speech Visit: 1 Visit SLP Evaluations $BSS Swallow: 1 Procedure $MBS Swallow: 1 Procedure SLP visit diagnosis: SLP Visit Diagnosis: Dysphagia, oropharyngeal phase (R13.12); Dysphagia, pharyngoesophageal phase (R13.14) Past Medical History: Past Medical History: Diagnosis Date  Acute lower GI bleeding 2008  Anxiety   Asthma   Carotid artery disease   Asymptomatic left carotid bruit  COPD (chronic obstructive pulmonary disease) (HCC)   Coronary artery disease   a. CABG - 1999 with LIMA-LAD, SVG-DIAG, SVG-OM, SVG-RPDA  b. Cath in setting of NSTEMI 03/18/2015: patent LIMA-LAD, occluded SVG-RCA, occluded SVG-D1, 99% stenosis of SVG-OM treated w/ DES  Depression   Dyslipidemia   Erectile dysfunction   Essential hypertension   History of blood transfusion 2008  History of stroke   Hyperlipidemia   Myocardial infarction [facility])  Past Surgical History: Past Surgical History: Procedure Laterality Date  CARDIAC CATHETERIZATION    CARDIAC CATHETERIZATION N/A 03/18/2015  Procedure: Left Heart Cath and Cors/Grafts Angiography;  Surgeon: Alm LELON Clay, MD;  Location: [facility] INVASIVE CV LAB;  Service: Cardiovascular;  Laterality: N/A;  CARDIAC CATHETERIZATION N/A 03/18/2015  Procedure: Coronary Stent Intervention;  Surgeon: Alm LELON Clay, MD;  Location: [facility] INVASIVE CV LAB;  Service: Cardiovascular;  Laterality: N/A;  CATARACT EXTRACTION W/ INTRAOCULAR LENS  IMPLANT, BILATERAL Bilateral   CORONARY ANGIOPLASTY    CORONARY ARTERY BYPASS GRAFT  1999  CORONARY STENT INTERVENTION N/A 04/22/2020  Procedure: CORONARY STENT INTERVENTION;  Surgeon: Claudene Victory LELON, MD;  Location: [facility] INVASIVE CV LAB;  Service: Cardiovascular;  Laterality: N/A;  FRACTURE SURGERY    HERNIA REPAIR    LEFT HEART CATH AND CORS/GRAFTS ANGIOGRAPHY N/A 04/22/2020  Procedure: LEFT HEART CATH AND CORS/GRAFTS ANGIOGRAPHY;  Surgeon: Claudene Victory LELON, MD;  Location: [facility] INVASIVE CV LAB;  Service: Cardiovascular;  Laterality:  N/A;  TIBIA FRACTURE SURGERY Left [facility]; took bone out of my right hip  TONSILLECTOMY    UMBILICAL HERNIA REPAIR  ~ 2000 Pat Adams,M.S.,CCC-SLP 02/18/2024, 12:42 PM  DG Chest Port 1 View Result Date: 02/18/2024 EXAM: 1 VIEW(S) XRAY OF THE CHEST 02/18/2024 12:46:00 AM COMPARISON: 02/17/2024 CLINICAL HISTORY: SOB (shortness of breath) [id number] FINDINGS: LUNGS AND PLEURA: Stable increased central vascularity is noted with very mild interstitial edema. No focal confluent infiltrate is seen. No sizable effusion is noted. No pneumothorax. Postsurgical changes are again noted. HEART AND MEDIASTINUM: Stable cardiomegaly. No acute abnormality of the mediastinal silhouette. BONES AND SOFT TISSUES: No acute osseous abnormality. IMPRESSION: 1. Stable increased central vascularity with very mild interstitial edema. No focal confluent infiltrate or sizable effusion. 2. Stable cardiomegaly. Electronically signed by: Oneil Devonshire MD 02/18/2024 12:53 AM EST RP Workstation: [id number]   DG Chest Port 1 View Result Date: 02/17/2024 EXAM: 1 VIEW(S) XRAY OF THE CHEST 02/17/2024 06:25:02 AM COMPARISON: 02/15/2024 CLINICAL HISTORY: Coarse respiratory crackles FINDINGS: LUNGS AND PLEURA: There are hazy and reticular opacities present bilaterally, more pronounced in the lung bases, which could reflect alveolar and interstitial edema or pneumonitis. No pleural effusion. No pneumothorax. HEART AND MEDIASTINUM: Mild cardiomegaly. Aortic arch atherosclerosis. CABG markers noted. BONES AND SOFT TISSUES: Median sternotomy wires and CABG markers noted. IMPRESSION: 1. Hazy and reticular opacities bilaterally, more pronounced in the lung bases, possibly reflecting alveolar and interstitial edema or pneumonitis. 2. Mild cardiomegaly and aortic arch atherosclerosis. Electronically signed by: Evalene Coho MD 02/17/2024 06:45 AM  EST RP Workstation: [id number]   CT HEAD WO CONTRAST ( ) Result Date: 02/15/2024 EXAM: CT  HEAD WITHOUT CONTRAST 02/15/2024 05:31:00 PM TECHNIQUE: CT of the head was performed without the administration of additional intravenous contrast. Residual intravenous contrast material from a CT performed earlier the same day is noted. Automated exposure control, iterative reconstruction, and/or weight based adjustment of the mA/kV was utilized to reduce the radiation dose to as low as reasonably achievable. COMPARISON: None available. CLINICAL HISTORY: Head trauma, minor (Age >= 65y) FINDINGS: BRAIN AND VENTRICLES: No acute hemorrhage. No evidence of acute infarct. No hydrocephalus. No extra-axial collection. No mass effect or midline shift. Left frontoparietal encephalomalacia. Multiple scattered remote cerebellar infarcts noted. Moderate global atrophy. Patchy supratentorial white matter hypoattenuation, possibly reflecting chronic microvascular ischemic changes. Intracranial atherosclerosis. 5 mm right MCA bifurcation aneurysm noted (series 8, image 19). ORBITS: No acute abnormality. SINUSES: No acute abnormality. SOFT TISSUES AND SKULL: Right parietal scalp contusion. No skull fracture. IMPRESSION: 1. No acute intracranial abnormality related to the head trauma. 2. Right parietal scalp contusion. 3. 5 mm right MCA bifurcation aneurysm. Electronically signed by: Morene Hoard MD 02/15/2024 06:19 PM EST RP Workstation: [id number]   CT ANGIO GI BLEED Result Date: 02/15/2024 CLINICAL DATA:  Lower GI bleed (Ped 0-17y) EXAM: CTA ABDOMEN AND PELVIS WITHOUT AND WITH CONTRAST TECHNIQUE: Initially, a noncontrast CT of the abdomen and pelvis was performed. Subsequently, multidetector CT imaging of the abdomen and pelvis was performed using the standard protocol during bolus administration of intravenous contrast. Multiplanar reconstructed images and MIPs were obtained and reviewed to evaluate the vascular anatomy. RADIATION DOSE REDUCTION: This exam was performed according to the departmental dose-optimization  program which includes automated exposure control, adjustment of the mA and/or kV according to patient size and/or use of iterative reconstruction technique. CONTRAST:  OMNIPAQUE  IOHEXOL  350 MG/ML SOLN COMPARISON:  02/15/2024 at 12:18 a.m. FINDINGS: VASCULAR Aorta: No aortic aneurysm or dissection. Similar fusiform ectasia of the infrarenal aorta measuring 2.7 cm. Extensive calcified atherosclerosis throughout the aorta. No hemodynamically significant stenosis. Celiac: Patent without acute thrombus, aneurysm, or dissection.No hemodynamically significant stenosis. SMA: Patent without acute thrombus, aneurysm, or dissection.Severe to critical stenosis of the proximal SMA from calcified plaque. Renals: Patent without acute thrombus or dissection.Moderate stenoses of the proximal renal arteries bilaterally due to calcified plaque. 8 mm peripherally calcified aneurysm arising from the posterior aspect of the upstream right renal artery. IMA: Patent without acute thrombus, aneurysm, or dissection.No hemodynamically significant stenosis. Inflow: Patent without acute thrombus, aneurysm, or dissection.No hemodynamically significant stenosis. Proximal Outflow: The bilateral common femoral and visualized portions of the superficial and profunda femoral arteries are patent without acute thrombus, aneurysm, or dissection.No hemodynamically significant stenosis. Veins: No obvious venous abnormality within the limitations of this arterial phase study. Review of the MIP images confirms the above findings. NON-VASCULAR Lower chest: No focal airspace consolidation or pleural effusion. Hepatobiliary: No mass.Cholecystectomy.No intrahepatic or extrahepatic biliary ductal dilation. Pancreas: No mass or main ductal dilation.No peripancreatic inflammation or fluid collection. Spleen: Normal size. No mass. Adrenals/Urinary Tract: No adrenal masses. Unchanged 2 cm left upper pole cyst. Persistent contrast opacification of the renal  cortices on the noncontrast imaging. No hydronephrosis or nephrolithiasis. Excreted contrast filling the bladder lumen. 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 The stomach is decompressed without acute abnormality. No small bowel wall thickening or inflammation. No small bowel obstruction.Normal appendix. Total colonic diverticulosis. No changes of acute diverticulitis. GI Bleed: Contrast extravasation along  the hepatic flexure of the colon (axial 22). Layering hyperdense material within multiple diverticula in the descending and sigmoid colon is present, possibly ingested material or layering contrast material from prior study. Lymphatic: No intraabdominal or pelvic lymphadenopathy. Reproductive: No prostatomegaly.No free pelvic fluid. Other: No pneumoperitoneum, ascites, or mesenteric inflammation. Musculoskeletal: No acute fracture or destructive lesion.Diffuse osteopenia. Multilevel degenerative disc disease of the spine. IMPRESSION: VASCULAR 1. Active GI bleeding along the hepatic flexure of the colon, possibly diverticular in nature. GI consultation recommended further management. 2. No aortic aneurysm or aortic dissection. NON-VASCULAR 1. 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 While no morphologic changes of cirrhosis are visualized, clinical and laboratory correlation requested. 2. Extensive total colonic diverticulosis. No changes of acute diverticulitis. 3. Persistent contrast opacification of the renal cortices on the noncontrast imaging, which can be seen in acute medical renal disease, possibly ATN. Critical Value/emergent results were called by telephone at the time of interpretation on 02/15/2024 at 11:00 am to provider [facility] , who verbally acknowledged these results. Aortic Atherosclerosis (ICD10-I70.0). Electronically Signed   By: Rogelia Myers M.D.   On:  02/15/2024 11:08   DG Chest Port 1 View Result Date: 02/15/2024 EXAM: 1 VIEW(S) XRAY OF THE CHEST 02/15/2024 01:39:00 AM COMPARISON: 09/12/2023 CLINICAL HISTORY: Questionable sepsis - evaluate for abnormality FINDINGS: LUNGS AND PLEURA: Mild patchy bilateral lower lobe opacities, atelectasis versus pneumonia. Increased interstitial markings without frank interstitial edema. No pleural effusion. No pneumothorax. HEART AND MEDIASTINUM: Stable mild cardiomegaly. Status post CABG. BONES AND SOFT TISSUES: Median sternotomy. No acute osseous abnormality. IMPRESSION: 1. Mild patchy bilateral lower lobe opacities, atelectasis versus pneumonia. 2. Stable mild cardiomegaly. No frank interstitial edema. Electronically signed by: Pinkie Pebbles MD 02/15/2024 01:44 AM EST RP Workstation: [id number]   CT ANGIO GI BLEED Result Date: 02/15/2024 EXAM: CTA ABDOMEN AND PELVIS WITH AND WITHOUT CONTRAST 02/15/2024 12:18:04 AM TECHNIQUE: CTA images of the abdomen and pelvis without and with intravenous contrast. Three-dimensional MIP/volume rendered formations were performed. Automated exposure control, iterative reconstruction, and/or weight based adjustment of the mA/kV was utilized to reduce the radiation dose to as low as reasonably achievable. CONTRAST: 75 mL iohexol  (OMNIPAQUE ) 350 MG/ML injection. COMPARISON: None available. CLINICAL HISTORY: Lower GI bleed. FINDINGS: VASCULATURE: GI BLEED: Following contrast administration, there is no intraluminal spillage of contrast to suggest active GI bleeding. AORTA: Atherosclerotic calcifications of the abdominal aorta, although patent. No acute finding. No abdominal aortic aneurysm. No dissection. CELIAC TRUNK: No acute finding. No occlusion or significant stenosis. SUPERIOR MESENTERIC ARTERY: Atherosclerotic calcifications of the proximal SMA, although patent. No acute finding. No occlusion or significant stenosis. INFERIOR MESENTERIC ARTERY: No acute finding. No occlusion or  significant stenosis. RENAL ARTERIES: Atherosclerotic calcifications of the bilateral renal arteries, although patent. No acute finding. No occlusion or significant stenosis. ILIAC ARTERIES: No acute finding. No occlusion or significant stenosis. ABDOMEN/PELVIS: LOWER CHEST: Cardiomegaly. Mild patchy bilateral lower lobe opacities, favoring pneumonia. Hypodense blood pool relative to myocardium, suggesting anemia. Median sternotomy, incompletely visualized. LIVER: The liver is unremarkable. GALLBLADDER AND BILE DUCTS: Status post cholecystectomy. No biliary ductal dilatation. SPLEEN: The spleen is unremarkable. PANCREAS: The pancreas is unremarkable. ADRENAL GLANDS: Bilateral adrenal glands demonstrate no acute abnormality. KIDNEYS, URETERS AND BLADDER: No stones in the kidneys or ureters. No hydronephrosis. No perinephric or periureteral stranding. Urinary bladder is unremarkable. GI AND BOWEL: Stomach and duodenal sweep demonstrate no acute abnormality. Normal appendix (image 51). Left colonic diverticulosis, without evidence of diverticulitis. There is  no bowel obstruction. No abnormal bowel wall thickening or distension. REPRODUCTIVE: The prostate is unremarkable. PERITONEUM AND RETROPERITONEUM: No ascites or free air. LYMPH NODES: No lymphadenopathy. BONES AND SOFT TISSUES: Mild degenerative changes of the visualized thoracolumbar spine. No acute soft tissue abnormality. IMPRESSION: 1. No active GI bleeding. 2. Mild patchy bilateral lower lobe opacities, favoring pneumonia. 3. Additional ancillary findings, as above. Electronically signed by: Pinkie Pebbles MD 02/15/2024 12:35 AM EST RP Workstation: [id number]    Labs:  CBC: Recent Labs    02/21/24 0709 02/22/24 0732 02/23/24 0810 02/24/24 0357  WBC 15.9* 15.2* 15.1* 13.7*  HGB 7.3* 6.3* 8.1* 7.5*  HCT 23.4* 19.8* 24.8* 23.4*  PLT 211 288 301 307    COAGS: Recent Labs    02/15/24 0155 02/16/24 0234 02/20/24 2258  INR 1.3* 1.4* 1.2     BMP: Recent Labs    02/21/24 0709 02/22/24 0732 02/23/24 0810 02/24/24 0357  NA 136 142 143 144  K 4.6 4.5 3.6 3.6  CL 104 113* 112* 112*  CO2 20* 24 21* 20*  GLUCOSE 130* 98 98 93  BUN 27* 33* 34* 29*  CALCIUM  8.3* 8.2* 8.1* 8.2*  CREATININE 1.09 0.91 0.86 0.78  GFRNONAA >60 >60 >60 >60    LIVER FUNCTION TESTS: Recent Labs    02/14/24 2348 02/20/24 2258 02/21/24 0709 02/23/24 0810 02/24/24 0357  BILITOT 1.3* 0.7 0.5 0.6  --   AST 35 33 35 28  --   ALT 15 24 25 18   --   ALKPHOS 56 85 80 84  --   PROT 5.3* 5.7* 5.2* 4.8*  --   ALBUMIN 2.8* 3.2* 2.9* 2.7* 2.6*    TUMOR MARKERS: No results for input(s): AFPTM, CEA, CA199, CHROMGRNA in the last 8760 hours.  Assessment and Plan: 83 y.o. male with past medical history significant for GI bleed 2008, anxiety, asthma, carotid artery disease, COPD, coronary artery disease with prior CABG, non-STEMI, depression, hyperlipidemia, hypertension, CHF, chronic A-fib, diverticulosis CVA with associated dysphagia/dysarthria who was recently admitted to [facility] with generalized weakness, dyspnea, low-grade fever, multifocal pneumonia concerning for aspiration pneumonia.  Also recently hospitalized for acute GI bleed due to diverticulosis, treated for pneumonia and discharged home.  Patient has been evaluated by SLP who recommended NPO.  Request now received for percutaneous gastrostomy tube placement.  Patient also noted to have a nonobstructing partial Schatzki ring in distal esophagus on recent endoscopy. Imaging studies have been reviewed by Dr. Jenna. Risks and benefits image guided gastrostomy tube placement was discussed with the patient/son including, but not limited to the need for a barium enema during the procedure, bleeding, infection, peritonitis and/or damage to adjacent structures.  All of the patient's questions were answered, patient is agreeable to proceed.  Consent signed and in chart.  COVID-19,  influenza and RSV PCR nonreactive A-20 pathogen RVP nonreactive Blood cultures NGTD  Pt afebrile, WBC     hgb    plts      PT/INR  15.8/1.2  on IV Unasyn   Thank you for allowing our service to participate in William Elliott 's care.  Electronically Signed: D. Franky Rakers, PA-C   02/24/2024, 5:23 PM      I spent a total of  40 minutes   in face to face in clinical consultation, greater than 50% of which was counseling/coordinating care for percutaneous gastrostomy tube placement  "

## 2024-02-25 DIAGNOSIS — R1312 Dysphagia, oropharyngeal phase: Secondary | ICD-10-CM | POA: Diagnosis not present

## 2024-02-25 DIAGNOSIS — J690 Pneumonitis due to inhalation of food and vomit: Secondary | ICD-10-CM | POA: Diagnosis not present

## 2024-02-25 DIAGNOSIS — Z7189 Other specified counseling: Secondary | ICD-10-CM | POA: Diagnosis not present

## 2024-02-25 DIAGNOSIS — J189 Pneumonia, unspecified organism: Secondary | ICD-10-CM | POA: Diagnosis not present

## 2024-02-25 LAB — RENAL FUNCTION PANEL
Albumin: 2.8 g/dL — ABNORMAL LOW (ref 3.5–5.0)
Anion gap: 12 (ref 5–15)
BUN: 24 mg/dL — ABNORMAL HIGH (ref 8–23)
CO2: 21 mmol/L — ABNORMAL LOW (ref 22–32)
Calcium: 8.4 mg/dL — ABNORMAL LOW (ref 8.9–10.3)
Chloride: 113 mmol/L — ABNORMAL HIGH (ref 98–111)
Creatinine, Ser: 0.69 mg/dL (ref 0.61–1.24)
GFR, Estimated: >60 mL/min
Glucose, Bld: 106 mg/dL — ABNORMAL HIGH (ref 70–99)
Phosphorus: 1.9 mg/dL — ABNORMAL LOW (ref 2.5–4.6)
Potassium: 3.5 mmol/L (ref 3.5–5.1)
Sodium: 146 mmol/L — ABNORMAL HIGH (ref 135–145)

## 2024-02-25 LAB — CBC
HCT: 24.6 % — ABNORMAL LOW (ref 39.0–52.0)
Hemoglobin: 7.6 g/dL — ABNORMAL LOW (ref 13.0–17.0)
MCH: 28.4 pg (ref 26.0–34.0)
MCHC: 30.9 g/dL (ref 30.0–36.0)
MCV: 91.8 fL (ref 80.0–100.0)
Platelets: 357 K/uL (ref 150–400)
RBC: 2.68 MIL/uL — ABNORMAL LOW (ref 4.22–5.81)
RDW: 16.9 % — ABNORMAL HIGH (ref 11.5–15.5)
WBC: 13.9 K/uL — ABNORMAL HIGH (ref 4.0–10.5)
nRBC: 0 % (ref 0.0–0.2)

## 2024-02-25 LAB — MAGNESIUM: Magnesium: 2.5 mg/dL — ABNORMAL HIGH (ref 1.7–2.4)

## 2024-02-25 LAB — PREPARE RBC (CROSSMATCH)

## 2024-02-25 MED ORDER — KCL IN DEXTROSE-NACL 20-5-0.45 MEQ/L-%-% IV SOLN
INTRAVENOUS | Status: AC
  Filled 2024-02-25 (×2): qty 1000

## 2024-02-25 MED ORDER — MELATONIN 5 MG PO TABS
5.0000 mg | ORAL_TABLET | Freq: Every evening | ORAL | Status: AC | PRN
  Administered 2024-02-25 – 2024-02-27 (×3): 5 mg via ORAL
  Filled 2024-02-25 (×3): qty 1

## 2024-02-25 MED ORDER — POTASSIUM PHOSPHATES 15 MMOLE/5ML IV SOLN
15.0000 mmol | Freq: Once | INTRAVENOUS | Status: AC
  Administered 2024-02-25: 15 mmol via INTRAVENOUS
  Filled 2024-02-25: qty 5

## 2024-02-25 MED ORDER — SODIUM CHLORIDE 0.9% IV SOLUTION: Freq: Once | INTRAVENOUS | Status: AC

## 2024-02-25 MED ORDER — MELATONIN 5 MG PO TABS
5.0000 mg | ORAL_TABLET | Freq: Once | ORAL | Status: AC
  Administered 2024-02-25: 5 mg via ORAL
  Filled 2024-02-25: qty 1

## 2024-02-25 NOTE — Progress Notes (Signed)
 SLP Cancellation Note  Patient Details Name: William Elliott MRN: [id number] DOB: December 23, 1940   Cancelled treatment:       Reason Eval/Treat Not Completed: Other (comment) Pt working with PT. Will f/u as able.    Leita SAILOR., M.A. CCC-SLP Acute Rehabilitation Services Office: [phone]  Secure chat preferred  02/25/2024, 2:12 PM

## 2024-02-25 NOTE — Progress Notes (Signed)
" °   02/25/24 1335  Spiritual Encounters  Type of Visit Initial  Care provided to: Pt and family  Reason for visit Advance directives  OnCall Visit No    Chaplain responded to spiritual consult for advance directives. Paperwork and information provided and all questions answered at this time. Son William Elliott bedside.  Spiritual care politely declined at this time, as no specifically [facility] or [address] chaplain available. Option to reach out to a family pastor or other faith leader offered and continues to remain open.  [facility] chaplains available as needed. "

## 2024-02-25 NOTE — Evaluation (Signed)
 Occupational Therapy Evaluation Patient Details Name: William Elliott MRN: [id number] DOB: [birth year] Today's Date: 02/25/2024   History of Present Illness   83 yo male admitted with anemia s/p transfusion, Pna, weakness. Recent d/c 12/21. PMH:  coronary artery disease, status post prior CABG, NSTEMI, GI bleed, falls, hemorrhagic shock, anemia, Afib, CHF,     Clinical Impressions PTA, patient lives at home with family and was relatively mod I prior to recent hospitalizations.  Currently, patient presents with deficits outlined below (see OT Problem List for details) most significantly decreased activity tolerance, balance, higher level cognition and generalized muscle weakness limiting BADL's and functional mobility performance. Patient will benefit from continued inpatient follow up therapy, <3 hours/day. Patient requires continued Acute care hospital level OT services to progress safety and functional performance and allow for discharge.       If plan is discharge home, recommend the following:   A lot of help with bathing/dressing/bathroom;Assistance with cooking/housework;Direct supervision/assist for medications management;Direct supervision/assist for financial management;Assist for transportation;Help with stairs or ramp for entrance;Two people to help with walking and/or transfers;Assistance with feeding     Functional Status Assessment   Patient has had a recent decline in their functional status and demonstrates the ability to make significant improvements in function in a reasonable and predictable amount of time.     Equipment Recommendations   None recommended by OT      Precautions/Restrictions   Precautions Precautions: Fall Recall of Precautions/Restrictions: Intact Precaution/Restrictions Comments: dizziness Restrictions Weight Bearing Restrictions Per Provider Order: No     Mobility Bed Mobility Overal bed mobility: Needs Assistance Bed Mobility:  Supine to Sit     Supine to sit: HOB elevated, Used rails, Min assist     General bed mobility comments: Increased time and effort with bed features    Transfers Overall transfer level: Needs assistance Equipment used: Rolling walker (2 wheels) Transfers: Sit to/from Stand, Bed to chair/wheelchair/BSC Sit to Stand: From elevated surface, Min assist, Mod assist     Step pivot transfers: Min assist, Mod assist, From elevated surface     General transfer comment: Assist to power up, stabilize, and weight shift      Balance Overall balance assessment: Needs assistance, History of Falls Sitting-balance support: No upper extremity supported, Feet supported Sitting balance-Leahy Scale: Good Sitting balance - Comments: seated EOB   Standing balance support: During functional activity, Reliant on assistive device for balance, Bilateral upper extremity supported Standing balance-Leahy Scale: Poor Standing balance comment: reliant on UE support                           ADL either performed or assessed with clinical judgement   ADL Overall ADL's : Needs assistance/impaired Eating/Feeding: NPO;Sitting Eating/Feeding Details (indicate cue type and reason): ice chips indep spoon to mouth Grooming: Wash/dry hands;Sitting;Set up;Wash/dry face   Upper Body Bathing: Minimal assistance;Sitting   Lower Body Bathing: Maximal assistance;Sit to/from stand   Upper Body Dressing : Minimal assistance;Sitting   Lower Body Dressing: Maximal assistance;Sit to/from stand;Cueing for sequencing;Cueing for safety   Toilet Transfer: Moderate assistance;BSC/3in1;Rolling walker (2 wheels)   Toileting- Clothing Manipulation and Hygiene: Total assistance;Sit to/from stand       Functional mobility during ADLs: Moderate assistance;Rolling walker (2 wheels) General ADL Comments: Pt with decreased activity tolerance, fatiguing quickly during tasks.     Vision Baseline Vision/History: 1  Wears glasses (not present in room during session; hx of cataracts with sx  2 or 3 years ago) Ability to See in Adequate Light: 0 Adequate Patient Visual Report: No change from baseline Vision Assessment?: Wears glasses for reading            Pertinent Vitals/Pain Pain Assessment Breathing: normal Negative Vocalization: none Body Language: relaxed Consolability: no need to console     Extremity/Trunk Assessment Upper Extremity Assessment Upper Extremity Assessment: Generalized weakness;Right hand dominant   Lower Extremity Assessment Lower Extremity Assessment: Generalized weakness   Cervical / Trunk Assessment Cervical / Trunk Assessment: Other exceptions (mild forward head)   Communication Communication Communication: Impaired Factors Affecting Communication: Reduced clarity of speech;Hearing impaired   Cognition Arousal: Alert Behavior During Therapy: WFL for tasks assessed/performed Cognition: Cognition impaired, Difficult to assess Difficult to assess due to: Hard of hearing/deaf   Awareness: Intellectual awareness intact, Online awareness intact Memory impairment (select all impairments): Short-term memory Attention impairment (select first level of impairment): Alternating attention   OT - Cognition Comments: Pt AAOx4 and pleasant throughout session with cognition largely [facility], but with noted deficits in short-term memory, safety awareness, and problem solving.                 Following commands: Intact       Cueing  General Comments   Cueing Techniques: Verbal cues  HR 76 bpm post session, placed on pillows on recliner for skin protection   Exercises Exercises: Other exercises (encouraged B ankle pumps and flutter valve q hr 10 reps)   Shoulder Instructions      Home Living Family/patient expects to be discharged to:: Private residence Living Arrangements: Spouse/significant other;Children Available Help at Discharge: Family;Available 24  hours/day Type of Home: House Home Access: Level entry     Home Layout: Two level;Able to live on main level with bedroom/bathroom;Other (Comment) Alternate Level Stairs-Number of Steps: flight with stair lift   Bathroom Shower/Tub: Chief Strategy Officer: Standard Bathroom Accessibility: Yes   Home Equipment: Agricultural Consultant (2 wheels);Cane - quad;Cane - single point;Electric scooter;Grab bars - tub/shower;Hand held shower head;Shower seat - built in   Additional Comments: [name] reports the [place] will be providing an elevator for the home in the near future.      Prior Functioning/Environment Prior Level of Function : Independent/Modified Independent;History of Falls (last six months);Driving             Mobility Comments: Recent fall when returning to bed after using the bathroom, son assisted. Pt states he typically does not use AD but has been using RW for about 1 week due to increased weakness. Pt and son also report intermittent use of RW in the past. ADLs Comments: Pt is typically Independent to Mod I with ADL and medication management. Pt reports he has intermittent assistance from for donning/doffing socks due to intermittent dizziness. Pt reports he drives with son adding the family recently moved and the pt has gotten lost multiple times while driving since moving. Son and DIL assist with meal prep, other home management tasks, and transportation PRN.    OT Problem List: Decreased strength;Decreased activity tolerance;Impaired balance (sitting and/or standing);Decreased coordination;Decreased cognition;Decreased safety awareness;Decreased knowledge of use of DME or AE;Decreased knowledge of precautions   OT Treatment/Interventions: Self-care/ADL training;Therapeutic exercise;Energy conservation;DME and/or AE instruction;Therapeutic activities;Cognitive remediation/compensation;Patient/family education;Balance training      OT Goals(Current goals can be found  in the care plan section)   Acute Rehab OT Goals Patient Stated Goal: to get to rehab OT Goal Formulation: With patient/family Time For  Goal Achievement: 03/10/24 Potential to Achieve Goals: Good   OT Frequency:  Min 2X/week    Co-evaluation              AM-PAC OT 6 Clicks Daily Activity     Outcome Measure Help from another person eating meals?: Total (NPO but ice chips, able to self feed indep) Help from another person taking care of personal grooming?: A Little Help from another person toileting, which includes using toliet, bedpan, or urinal?: A Lot Help from another person bathing (including washing, rinsing, drying)?: A Lot Help from another person to put on and taking off regular upper body clothing?: A Little Help from another person to put on and taking off regular lower body clothing?: A Lot 6 Click Score: 13   End of Session Equipment Utilized During Treatment: Rolling walker (2 wheels);Gait belt Nurse Communication: Mobility status  Activity Tolerance: Patient tolerated treatment well;Patient limited by fatigue Patient left: with call bell/phone within reach;in chair;with chair alarm set;with family/visitor present  OT Visit Diagnosis: Unsteadiness on feet (R26.81);Other abnormalities of gait and mobility (R26.89);Muscle weakness (generalized) (M62.81);History of falling (Z91.81);Other symptoms and signs involving cognitive function                Time: 8854-8781 OT Time Calculation (min): 33 min Charges:  OT General Charges $OT Visit: 1 Visit OT Evaluation $OT Eval Low Complexity: 1 Low OT Treatments $Therapeutic Activity: 8-22 mins  Buelah Rennie OT/L Acute Rehabilitation Department  [phone]  02/25/2024, 12:29 PM

## 2024-02-25 NOTE — Plan of Care (Signed)
" °  Problem: Education: Goal: Knowledge of General Education information will improve Description: Including pain rating scale, medication(s)/side effects and non-pharmacologic comfort measures Outcome: Progressing   Problem: Health Behavior/Discharge Planning: Goal: Ability to manage health-related needs will improve Outcome: Progressing   Problem: Clinical Measurements: Goal: Ability to maintain clinical measurements within normal limits will improve Outcome: Progressing Goal: Will remain free from infection Outcome: Progressing Goal: Diagnostic test results will improve 02/25/2024 0805 by Alaina Dozier PARAS, RN Outcome: Progressing 02/25/2024 0800 by Alaina Dozier PARAS, RN Outcome: Progressing Goal: Respiratory complications will improve 02/25/2024 0805 by Alaina Dozier PARAS, RN Outcome: Progressing 02/25/2024 0800 by Alaina Dozier PARAS, RN Outcome: Progressing Goal: Cardiovascular complication will be avoided Outcome: Progressing   Problem: Activity: Goal: Risk for activity intolerance will decrease Outcome: Progressing   Problem: Nutrition: Goal: Adequate nutrition will be maintained Outcome: Progressing   "

## 2024-02-25 NOTE — Progress Notes (Signed)
 Physical Therapy Treatment Patient Details Name: William Elliott MRN: [id number] DOB: 11/25/1940 Today's Date: 02/25/2024   History of Present Illness 83 yo male admitted with anemia s/p transfusion, Pna, weakness. Recent d/c 12/21. PMH:  coronary artery disease, status post prior CABG, NSTEMI, GI bleed, falls, hemorrhagic shock, anemia, Afib, CHF,    PT Comments  Pt seated in recliner, requesting to walk a little and get back in bed. IV line noted to be bleeding, RN notified and increased time required to manage line and stop bleeding. Continues to require MOD A to stand with VC for proper hand placement demonstrating limited carryover of cues and education. Tolerates short distance walking, cues for safety and proper proximity to DME. Assistance to return to bed and for positioning. He continues to be limited be significant decrease in functional strength and activity tolerance. Vitals remained stable throughout however increase in audible RR noted with walking. He will continue to benefit from skilled PT while admitted to acute, and follow up therapy at SNF for <3hrs/day.    If plan is discharge home, recommend the following: A little help with walking and/or transfers;A little help with bathing/dressing/bathroom;Assistance with cooking/housework;Assist for transportation;Help with stairs or ramp for entrance   Can travel by private vehicle        Equipment Recommendations  None recommended by PT    Recommendations for Other Services       Precautions / Restrictions Precautions Precautions: Fall Recall of Precautions/Restrictions: Intact Precaution/Restrictions Comments: dizziness Restrictions Weight Bearing Restrictions Per Provider Order: No     Mobility  Bed Mobility           Sit to supine: Min assist, HOB elevated, Used rails   General bed mobility comments: seated in recliner upon PT arrival; assistance at BLE to return to bed at end of session     Transfers Overall transfer level: Needs assistance Equipment used: Rolling walker (2 wheels) Transfers: Sit to/from Stand Sit to Stand: Mod assist           General transfer comment: lift assistance to power to stand, VC for proper hand placement    Ambulation/Gait Ambulation/Gait assistance: Min assist Gait Distance (Feet): 15 Feet Assistive device: Rolling walker (2 wheels) Gait Pattern/deviations: Step-through pattern, Decreased stride length Gait velocity: reduced     General Gait Details: ambulate from recliner to opposite side of bed, increased RR and SOB noted with task, vitals remained stable. VC for increased upright posture and gaze. VC to sequence side steps towards [facility] before attempting to sit   Stairs             Wheelchair Mobility     Tilt Bed    Modified Rankin (Stroke Patients Only)       Balance Overall balance assessment: Needs assistance, History of Falls Sitting-balance support: No upper extremity supported, Feet supported Sitting balance-Leahy Scale: Good Sitting balance - Comments: seated EOB and in recliner   Standing balance support: During functional activity, Reliant on assistive device for balance, Bilateral upper extremity supported Standing balance-Leahy Scale: Poor Standing balance comment: reliant on UE support                            Communication Communication Communication: Impaired (dysarthria) Factors Affecting Communication: Reduced clarity of speech;Hearing impaired  Cognition Arousal: Alert Behavior During Therapy: WFL for tasks assessed/performed   PT - Cognitive impairments: Sequencing, Memory, Attention  Following commands: Intact      Cueing Cueing Techniques: Verbal cues, Tactile cues  Exercises      General Comments General comments (skin integrity, edema, etc.): HR 76 bpm post session, placed on pillows on recliner for skin protection       Pertinent Vitals/Pain Pain Assessment Pain Assessment: No/denies pain    Home Living Family/patient expects to be discharged to:: Private residence Living Arrangements: Spouse/significant other;Children Available Help at Discharge: Family;Available 24 hours/day Type of Home: House Home Access: Level entry     Alternate Level Stairs-Number of Steps: flight with stair lift Home Layout: Two level;Able to live on main level with bedroom/bathroom;Other (Comment) Home Equipment: Rolling Walker (2 wheels);Cane - quad;Cane - single point;Electric scooter;Grab bars - tub/shower;Hand held shower head;Shower seat - built in Additional Comments: [name] reports the [employer] will be providing an elevator for the home in the near future.    Prior Function            PT Goals (current goals can now be found in the care plan section) Acute Rehab PT Goals Patient Stated Goal: get better, stronger PT Goal Formulation: With patient Time For Goal Achievement: 03/08/24 Potential to Achieve Goals: Good Progress towards PT goals: Progressing toward goals    Frequency    Min 3X/week      PT Plan      Co-evaluation              AM-PAC PT 6 Clicks Mobility   Outcome Measure  Help needed turning from your back to your side while in a flat bed without using bedrails?: A Little Help needed moving from lying on your back to sitting on the side of a flat bed without using bedrails?: A Little Help needed moving to and from a bed to a chair (including a wheelchair)?: A Little Help needed standing up from a chair using your arms (e.g., wheelchair or bedside chair)?: A Little Help needed to walk in hospital room?: A Little Help needed climbing 3-5 steps with a railing? : Total 6 Click Score: 16    End of Session Equipment Utilized During Treatment: Gait belt   Patient left: in bed;with call bell/phone within reach;with bed alarm set;with family/visitor present Nurse Communication: Mobility  status (vitals with mobility, leaking IV line) PT Visit Diagnosis: Muscle weakness (generalized) (M62.81);Difficulty in walking, not elsewhere classified (R26.2);Unsteadiness on feet (R26.81);History of falling (Z91.81)     Time: 1411-1450 PT Time Calculation (min) (ACUTE ONLY): 39 min  Charges:    $Therapeutic Activity: 38-52 mins                       Rimsha Trembley [facility], PT, DPT   [employer] 02/25/2024, 3:01 PM

## 2024-02-25 NOTE — NC FL2 (Signed)
 " [place]  MEDICAID FL2 LEVEL OF CARE FORM     IDENTIFICATION  Patient Name: William Elliott Birthdate: 06-10-1940 Sex: male Admission Date (Current Location): 02/20/2024  [place] and [place] Number:  [name] [id number] Facility and Address:  [facility],  [address], [place] [address]      Provider Number:    Attending Physician Name and Address:  Kathrin Mignon DASEN, MD  Relative Name and Phone Number:  Son, Emergency Contact  [phone] (Mobile)    Current Level of Care: SNF Recommended Level of Care: Skilled Nursing Facility Prior Approval Number:    Date Approved/Denied:   PASRR Number: [id number]  Discharge Plan: SNF    Current Diagnoses: Patient Active Problem List   Diagnosis Date Noted   HCAP (healthcare-associated pneumonia) 02/21/2024   Abnormal CT scan, stomach 02/16/2024   Angiodysplasia of colon with hemorrhage 02/16/2024   Diverticulosis of colon 02/16/2024   Acute bleeding 02/15/2024   Gastrointestinal hemorrhage 09/16/2023   Chronic atrial fibrillation (HCC) 09/14/2023   Acute on chronic systolic heart failure (HCC) 09/13/2023   Symptomatic anemia 09/12/2023   Hematochezia 01/20/2023   Lower GI bleed 01/19/2023   Acute GI bleeding 01/17/2023   ABLA (acute blood loss anemia) 01/17/2023   Diverticulosis 01/17/2023   Non-STEMI (non-ST elevated myocardial infarction) (HCC) 04/19/2020   Permanent atrial fibrillation (HCC) 04/19/2020   PAF (paroxysmal atrial fibrillation) (HCC) 12/10/2015   Aneurysm    Transient confusion    Cerebral thrombosis with cerebral infarction 12/08/2015   Cerebral embolism with cerebral infarction 12/08/2015   Intracerebral hemorrhage 12/08/2015   Confusion 12/07/2015   NSTEMI (non-ST elevated myocardial infarction) (HCC) 03/18/2015   Coronary artery disease involving coronary bypass graft of native heart with unstable angina pectoris (HCC)    ASCVD (arteriosclerotic cardiovascular disease) 03/17/2015    Crescendo angina (HCC) 03/16/2015   Essential hypertension 07/27/2014   Dyspnea 07/26/2014   Benign hypertensive heart disease without heart failure 08/04/2013   BPH associated with nocturia 03/17/2011   Coronary artery disease involving native coronary artery with unstable angina pectoris (HCC)    Hyperlipidemia    Carotid bruit    Demand ischemia (HCC)    Dyslipidemia    COPD (chronic obstructive pulmonary disease) (HCC)     Orientation RESPIRATION BLADDER Height & Weight     Self, Situation, Place  Normal Continent Weight: 153 lb 3.5 oz (69.5 kg) Height:  5' 10 (177.8 cm)  BEHAVIORAL SYMPTOMS/MOOD NEUROLOGICAL BOWEL NUTRITION STATUS      Continent Diet  AMBULATORY STATUS COMMUNICATION OF NEEDS Skin   Limited Assist Verbally Normal                       Personal Care Assistance Level of Assistance  Bathing, Feeding, Dressing Bathing Assistance: Limited assistance Feeding assistance: Limited assistance Dressing Assistance: Limited assistance     Functional Limitations Info  Sight, Hearing Sight Info: Impaired Hearing Info: Impaired      SPECIAL CARE FACTORS FREQUENCY  PT (By licensed PT), OT (By licensed OT)     PT Frequency: 5 X week OT Frequency: 5 X week            Contractures Contractures Info: Not present    Additional Factors Info  Code Status, Allergies Code Status Info: DNR -Limited Allergies Info: Anoro Ellipta  (Umeclidinium-vilanterol), Sulfites, Lipitor (Atorvastatin Calcium ), Niaspan (Niacin), Tricor (Fenofibrate), Zocor (Simvastatin)           Current Medications (02/25/2024):  This is the current  hospital active medication list Current Facility-Administered Medications  Medication Dose Route Frequency Provider Last Rate Last Admin   acetaminophen  (TYLENOL ) tablet 650 mg  650 mg Oral Q6H PRN Thomas, Sara-Maiz A, MD   650 mg at 02/22/24 1427   Or   acetaminophen  (TYLENOL ) suppository 650 mg  650 mg Rectal Q6H PRN Thomas, Sara-Maiz  A, MD       albuterol  (PROVENTIL ) (2.5 MG/3ML) 0.083% nebulizer solution 2.5 mg  2.5 mg Nebulization Q2H PRN Debby Camila LABOR, MD       ALPRAZolam  (XANAX ) tablet 0.5 mg  0.5 mg Oral Q8H Debby Camila A, MD   0.5 mg at 02/25/24 9365   Ampicillin -Sulbactam (UNASYN ) 3 g in sodium chloride  0.9 % 100 mL IVPB  3 g Intravenous Q6H Gonfa, Taye T, MD 200 mL/hr at 02/25/24 0640 3 g at 02/25/24 0640   arformoterol  (BROVANA ) nebulizer solution 15 mcg  15 mcg Nebulization BID Debby Camila A, MD   15 mcg at 02/25/24 1014   And   umeclidinium bromide  (INCRUSE ELLIPTA ) 62.5 MCG/ACT 1 puff  1 puff Inhalation Daily Debby Camila A, MD   1 puff at 02/25/24 1013   Bempedoic Acid -Ezetimibe  180-10 MG TABS 1 tablet  1 tablet Oral Daily Debby Camila LABOR, MD       clotrimazole  (MYCELEX ) troche 10 mg  10 mg Oral 5 X Daily Debby Camila A, MD   10 mg at 02/25/24 9171   dextrose  5 % and 0.45 % NaCl with KCl 20 mEq/L infusion   Intravenous Continuous Gonfa, Taye T, MD 65 mL/hr at 02/25/24 0829 New Bag at 02/25/24 0829   ferrous sulfate  tablet 325 mg  325 mg Oral Q breakfast Debby Camila A, MD   325 mg at 02/25/24 0829   ipratropium-albuterol  (DUONEB) 0.5-2.5 (3) MG/3ML nebulizer solution 3 mL  3 mL Nebulization Q4H PRN Debby Camila A, MD   3 mL at 02/24/24 1501   lip balm (CARMEX) ointment   Topical PRN Kathrin Mignon DASEN, MD   Given at 02/24/24 0302   magic mouthwash w/lidocaine   10 mL Oral QID PRN Thomas, Sara-Maiz A, MD   10 mL at 02/24/24 1806   menthol  (CEPACOL) lozenge 3 mg  1 lozenge Oral PRN Debby Camila LABOR, MD   3 mg at 02/21/24 9297   mexiletine (MEXITIL ) capsule 200 mg  200 mg Oral BID Thomas, Sara-Maiz A, MD   200 mg at 02/25/24 9170   ondansetron  (ZOFRAN ) tablet 4 mg  4 mg Oral Q6H PRN Debby Camila LABOR, MD       Or   ondansetron  (ZOFRAN ) injection 4 mg  4 mg Intravenous Q6H PRN Debby Camila LABOR, MD       pantoprazole  (PROTONIX ) EC tablet 40 mg  40 mg Oral Daily Thomas,  Sara-Maiz A, MD   40 mg at 02/25/24 9170   potassium PHOSPHATE  15 mmol in dextrose  5 % 250 mL infusion  15 mmol Intravenous Once Gonfa, Taye T, MD 43 mL/hr at 02/25/24 1111 15 mmol at 02/25/24 1111     Discharge Medications: Please see discharge summary for a list of discharge medications.  Relevant Imaging Results:  Relevant Lab Results:   Additional Information [id number]  Lorraine LILLETTE Fenton, LCSW     "

## 2024-02-25 NOTE — Progress Notes (Signed)
 "                                                                                                                                                                                                          Daily Progress Note   Patient Name: William Elliott       Date: 02/25/2024 DOB: May 07, 1940  Age: 83 y.o. MRN#: [id number] Attending Physician: Kathrin Mignon DASEN, MD Primary Care Physician: [name], MD Admit Date: 02/20/2024  Reason for Consultation/Follow-up: Establishing goals of care  Subjective:  Resting in bed, son at bedside on phone, patient appears in no distress, discussed with Dr Kathrin, IR note regarding PEG reviewed, TOC note reviewed as well.   Length of Stay: 4  Current Medications: Scheduled Meds:   ALPRAZolam   0.5 mg Oral Q8H   arformoterol   15 mcg Nebulization BID   And   umeclidinium bromide   1 puff Inhalation Daily   Bempedoic Acid -Ezetimibe   1 tablet Oral Daily   clotrimazole   10 mg Oral 5 X Daily   ferrous sulfate   325 mg Oral Q breakfast   mexiletine  200 mg Oral BID   pantoprazole   40 mg Oral Daily    Continuous Infusions:  ampicillin -sulbactam (UNASYN ) IV 3 g (02/25/24 1229)   dextrose  5 % and 0.45 % NaCl with KCl 20 mEq/L 65 mL/hr at 02/25/24 9170   potassium PHOSPHATE  IVPB (in mmol) 15 mmol (02/25/24 1111)    PRN Meds: acetaminophen  **OR** acetaminophen , albuterol , ipratropium-albuterol , lip balm, magic mouthwash w/lidocaine , menthol , ondansetron  **OR** ondansetron  (ZOFRAN ) IV  Physical Exam         Resting in bed No distress Appears with generalized weakness  Vital Signs: BP (!) 133/47   Pulse 60   Temp 98 F (36.7 C) (Oral)   Resp 20   Ht 5' 10 (1.778 m)   Wt 69.5 kg   SpO2 94%   BMI 21.98 kg/m  SpO2: SpO2: 94 % O2 Device: O2 Device: Room Air O2 Flow Rate:    Intake/output summary:  Intake/Output Summary (Last 24 hours) at 02/25/2024 1341 Last data filed at 02/25/2024 1240 Gross per 24 hour  Intake 700 ml  Output --  Net 700  ml   LBM: Last BM Date : 02/22/24 Baseline Weight: Weight: 71.6 kg Most recent weight: Weight: 69.5 kg       Palliative Assessment/Data:      Patient Active Problem List   Diagnosis Date Noted   HCAP (healthcare-associated pneumonia) 02/21/2024   Abnormal CT scan, stomach 02/16/2024   Angiodysplasia of  colon with hemorrhage 02/16/2024   Diverticulosis of colon 02/16/2024   Acute bleeding 02/15/2024   Gastrointestinal hemorrhage 09/16/2023   Chronic atrial fibrillation (HCC) 09/14/2023   Acute on chronic systolic heart failure (HCC) 09/13/2023   Symptomatic anemia 09/12/2023   Hematochezia 01/20/2023   Lower GI bleed 01/19/2023   Acute GI bleeding 01/17/2023   ABLA (acute blood loss anemia) 01/17/2023   Diverticulosis 01/17/2023   Non-STEMI (non-ST elevated myocardial infarction) (HCC) 04/19/2020   Permanent atrial fibrillation (HCC) 04/19/2020   PAF (paroxysmal atrial fibrillation) (HCC) 12/10/2015   Aneurysm    Transient confusion    Cerebral thrombosis with cerebral infarction 12/08/2015   Cerebral embolism with cerebral infarction 12/08/2015   Intracerebral hemorrhage 12/08/2015   Confusion 12/07/2015   NSTEMI (non-ST elevated myocardial infarction) (HCC) 03/18/2015   Coronary artery disease involving coronary bypass graft of native heart with unstable angina pectoris (HCC)    ASCVD (arteriosclerotic cardiovascular disease) 03/17/2015   Crescendo angina (HCC) 03/16/2015   Essential hypertension 07/27/2014   Dyspnea 07/26/2014   Benign hypertensive heart disease without heart failure 08/04/2013   BPH associated with nocturia 03/17/2011   Coronary artery disease involving native coronary artery with unstable angina pectoris (HCC)    Hyperlipidemia    Carotid bruit    Demand ischemia (HCC)    Dyslipidemia    COPD (chronic obstructive pulmonary disease) (HCC)     Palliative Care Assessment & Plan   Patient Profile:    Assessment: 82 year old with past  medical history of coronary artery disease CABG history of CVA with dysphagia and dysarthria, history of systolic CHF chronic A-fib hypertension dyslipidemia diverticulosis GI bleed Patient admitted to the hospital with generalized weakness shortness of breath low-grade fever multifocal pneumonia concerning for aspiration pneumonia Status post recent hospitalization for acute GI bleed secondary to diverticulosis Lives at home with wife and his son Evaluated by SLP who have recommended n.p.o., IR consulted for G-tube placement Palliative care for ongoing goals of care discussions  Recommendations/Plan:  Agree with DNR Under IR evaluation for PEG Recommend SNF rehab with palliative.     Code Status:    Code Status Orders  (From admission, onward)           Start     Ordered   02/25/24 0931  Do not attempt resuscitation (DNR)- Limited -Do Not Intubate (DNI)  (Code Status)  Continuous       Question Answer Comment  If pulseless and not breathing No CPR or chest compressions.   In Pre-Arrest Conditions (Patient Is Breathing and Has A Pulse) Do not intubate. Provide all appropriate non-invasive medical interventions. Avoid ICU transfer unless indicated or required.   Consent: Discussion documented in EHR or advanced directives reviewed      02/25/24 0931           Code Status History     Date Active Date Inactive Code Status Order ID Comments User Context   02/21/2024 0355 02/25/2024 0931 Full Code [id number]  Debby Camila LABOR, MD ED   02/15/2024 0325 02/19/2024 1615 Full Code [id number]  Layman Raisin, DO ED   09/12/2023 0714 09/16/2023 1538 Full Code [id number]  Georgina Basket, MD ED   01/17/2023 1156 01/22/2023 1808 Full Code [id number]  Pearlean Manus, MD ED   04/19/2020 2231 04/23/2020 2009 Full Code [id number]  Prentiss Crochet, MD Inpatient   12/07/2015 1048 12/11/2015 1714 Full Code [id number]  Jonel Lonni SQUIBB, MD ED   03/18/2015 1032 03/19/2015 1508 Full Code [id number]   Anner,  Alm ORN, MD Inpatient   03/17/2015 1646 03/18/2015 1032 Full Code [id number]  Jerilynn Lamarr HERO, NP ED      Advance Directive Documentation    Flowsheet Row Most Recent Value  Type of Advance Directive Healthcare Power of Attorney, Living will  Pre-existing out of facility DNR order (yellow form or pink MOST form) --  MOST Form in Place? --    Prognosis:  Unable to determine  Discharge Planning: Skilled Nursing Facility for rehab with Palliative care service follow-up  Care plan was discussed with IDT  Thank you for allowing the Palliative Medicine Team to assist in the care of this patient. Mod MDM     Greater than 50%  of this time was spent counseling and coordinating care related to the above assessment and plan.  Lonia Serve, MD  Please contact Palliative Medicine Team phone at [phone] for questions and concerns.       "

## 2024-02-25 NOTE — Plan of Care (Signed)

## 2024-02-25 NOTE — Progress Notes (Signed)
 " PROGRESS NOTE  William Elliott FMW:[id number] DOB: [birth year]   PCP: [name], MD  Patient is from: Home.  Lives with wife.   DOA: 02/20/2024 LOS: 4  Chief complaints Chief Complaint  Patient presents with   Shortness of Breath     Brief Narrative / Interim history: 83 year old M with PMH of CAD/CABG, CVA with dysphagia and dysarthria, systolic CHF, chronic A-fib, HTN, HLD, diverticulosis and GI bleed returning with generalized weakness, shortness of breath and low-grade fever, and admitted with multifocal pneumonia concerning for aspiration pneumonia.  Recently hospitalized for acute GI bleed due to diverticulosis and treated for pneumonia and discharged home.  Evaluated by SLP who recommended NPO.  IR consulted for G-tube placement after discussion with patient and family..  Palliative consulted as well.  CODE STATUS changed to DNR on 12/26.    Subjective: Seen and examined earlier this morning.  No major events overnight or this morning.  No complaint this morning.  Son at bedside.  Extensive discussion about CODE STATUS including pros and cons of CPR and intubation. Both patient and son agreed that CPR and intubation are too much with his age, frailty and comorbidity.  They want CODE STATUS changed to DNR.   Assessment and plan: Bilateral multifocal pneumonia: Concerning for aspiration pneumonia and patient with chronic dysphagia.  CT chest showed bilateral infiltrate.  Pro-Cal 0.35.   -Was on vancomycin , cefepime  and Flagyl .  De-escalated to Unasyn , continue.   -Pulmonary toilet with chest PT, IS, mucolytic's and bronchodilators.   -SLP recommended NPO.  IR consulted for G-tube after discussion with patient and family -Gentle IV fluid while n.p.o. -Continue aspiration precaution   Acute on chronic anemia: Recently hospitalized and transfused about 5 units. EGD and colonoscopy last week.  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   Hgb dropped to 6.3 on 12/23.  Improved to 8.1 after 1 unit.  No report of overt bleeding. Recent Labs    02/17/24 0444 02/18/24 0608 02/19/24 0450 02/20/24 2258 02/20/24 2316 02/21/24 0709 02/22/24 0732 02/23/24 0810 02/24/24 0357 02/25/24 0343  HGB 7.5* 8.0* 7.9* 8.2* 8.8*  8.5* 7.3* 6.3* 8.1* 7.5* 7.6*  -Transfuse 1 unit today. -Continue monitoring.   Chronic HFrEF: Currently euvolemic.  Good urine output. -Hold further diuretics while NPO. - respiratory and fluid status while on gentle IV fluid   Oropharyngeal dysphagia -SLP recommended NPO. -IR consulted for G-tube after discussion with patient and son.   Chronic A-fib: Rate controlled.  Not on anticoagulation due to GI bleed.   Debility and physical deconditioning: At baseline, ambulates with rolling walker.  Also has a stair lift. - PT/OT-recommended SNF.  Advance care planning: Extensive discussion about CODE STATUS including pros and cons of CPR and intubation. Both patient and son agreed that CPR and intubation are too much with his age, frailty and comorbidity.  They want CODE STATUS changed to DNR. - CODE STATUS changed.  RN notified.  Body mass index is 21.98 kg/m.          DVT prophylaxis:  Place and maintain sequential compression device Start: 02/21/24 0445  Code Status: Full code Family Communication: Updated patient's son at bedside. Level of care: Progressive Status is: Inpatient Remains inpatient appropriate because: Aspiration pneumonia, dysphagia   Final disposition: SNF   55 minutes with more than 50% spent in reviewing records, counseling patient/family and coordinating care.  Consultants:  Palliative medicine Interventional radiology  Procedures: None  Microbiology summarized: COVID-19,  influenza and RSV PCR nonreactive A-20 pathogen RVP nonreactive Blood cultures NGTD  Objective: Vitals:   02/24/24 2121 02/25/24 0444 02/25/24 0445 02/25/24 1013  BP: 136/75 (!) 121/55     Pulse: (!) 56 71    Resp: 20 20    Temp: 98.4 F (36.9 C) 98 F (36.7 C)    TempSrc: Oral Oral    SpO2: 94% 99%  98%  Weight:   69.5 kg   Height:        Examination:  GENERAL: No apparent distress.  Nontoxic. HEENT: MMM.  Vision and hearing grossly intact.  NECK: Supple.  No apparent JVD.  RESP:  No IWOB.  Fair aeration bilaterally.  CVS:  RRR. Heart sounds normal.  ABD/GI/GU: BS+. Abd soft, NTND.  MSK/EXT:  Moves extremities. No apparent deformity.  Chronic left foot edema. SKIN: no apparent skin lesion or wound NEURO: AA.  Oriented appropriately.  Dysarthria.  No apparent focal neuro deficit. PSYCH: Calm. Normal affect.   Sch Meds:  Scheduled Meds:  ALPRAZolam   0.5 mg Oral Q8H   arformoterol   15 mcg Nebulization BID   And   umeclidinium bromide   1 puff Inhalation Daily   Bempedoic Acid -Ezetimibe   1 tablet Oral Daily   clotrimazole   10 mg Oral 5 X Daily   ferrous sulfate   325 mg Oral Q breakfast   mexiletine  200 mg Oral BID   pantoprazole   40 mg Oral Daily   Continuous Infusions:  ampicillin -sulbactam (UNASYN ) IV 3 g (02/25/24 0640)   dextrose  5 % and 0.45 % NaCl with KCl 20 mEq/L 65 mL/hr at 02/25/24 9170   potassium PHOSPHATE  IVPB (in mmol) 15 mmol (02/25/24 1111)   PRN Meds:.acetaminophen  **OR** acetaminophen , albuterol , ipratropium-albuterol , lip balm, magic mouthwash w/lidocaine , menthol , ondansetron  **OR** ondansetron  (ZOFRAN ) IV  Antimicrobials: Anti-infectives (From admission, onward)    Start     Dose/Rate Route Frequency Ordered Stop   02/23/24 1800  Ampicillin -Sulbactam (UNASYN ) 3 g in sodium chloride  0.9 % 100 mL IVPB        3 g 200 mL/hr over 30 Minutes Intravenous Every 6 hours 02/23/24 1402     02/22/24 1800  ceFEPIme  (MAXIPIME ) 2 g in sodium chloride  0.9 % 100 mL IVPB  Status:  Discontinued        2 g 200 mL/hr over 30 Minutes Intravenous Every 8 hours 02/22/24 1148 02/23/24 1402   02/21/24 2200  vancomycin  (VANCOCIN ) IVPB 1000 mg/200 mL premix   Status:  Discontinued        1,000 mg 200 mL/hr over 60 Minutes Intravenous Every 24 hours 02/21/24 0458 02/22/24 1148   02/21/24 1000  metroNIDAZOLE  (FLAGYL ) IVPB 500 mg  Status:  Discontinued        500 mg 100 mL/hr over 60 Minutes Intravenous Every 12 hours 02/21/24 0445 02/23/24 1402   02/21/24 1000  ceFEPIme  (MAXIPIME ) 2 g in sodium chloride  0.9 % 100 mL IVPB  Status:  Discontinued        2 g 200 mL/hr over 30 Minutes Intravenous Every 12 hours 02/21/24 0458 02/22/24 1148   02/21/24 0100  azithromycin  (ZITHROMAX ) 500 mg in sodium chloride  0.9 % 250 mL IVPB        500 mg 250 mL/hr over 60 Minutes Intravenous  Once 02/21/24 0053 02/21/24 0309   02/20/24 2315  ceFEPIme  (MAXIPIME ) 2 g in sodium chloride  0.9 % 100 mL IVPB        2 g 200 mL/hr over 30 Minutes Intravenous  Once 02/20/24 2303  02/20/24 2348   02/20/24 2315  metroNIDAZOLE  (FLAGYL ) IVPB 500 mg        500 mg 100 mL/hr over 60 Minutes Intravenous  Once 02/20/24 2303 02/21/24 0019   02/20/24 2315  vancomycin  (VANCOCIN ) IVPB 1000 mg/200 mL premix  Status:  Discontinued        1,000 mg 200 mL/hr over 60 Minutes Intravenous  Once 02/20/24 2303 02/20/24 2306   02/20/24 2315  vancomycin  (VANCOREADY) IVPB 1750 mg/350 mL        1,750 mg 175 mL/hr over 120 Minutes Intravenous  Once 02/20/24 2306 02/21/24 0224        I have personally reviewed the following labs and images: CBC: Recent Labs  Lab 02/20/24 2258 02/20/24 2316 02/21/24 0709 02/22/24 0732 02/23/24 0810 02/24/24 0357 02/25/24 0343  WBC 13.3*  --  15.9* 15.2* 15.1* 13.7* 13.9*  NEUTROABS 11.5*  --   --   --   --   --   --   HGB 8.2*   < > 7.3* 6.3* 8.1* 7.5* 7.6*  HCT 25.9*   < > 23.4* 19.8* 24.8* 23.4* 24.6*  MCV 93.8  --  93.2 92.1 89.2 90.7 91.8  PLT 242  --  211 288 301 307 357   < > = values in this interval not displayed.   BMP &GFR Recent Labs  Lab 02/20/24 2258 02/20/24 2316 02/21/24 0709 02/22/24 0732 02/23/24 0810 02/24/24 0357  02/25/24 0343  NA 137   < > 136 142 143 144 146*  K 4.2   < > 4.6 4.5 3.6 3.6 3.5  CL 103   < > 104 113* 112* 112* 113*  CO2 25  --  20* 24 21* 20* 21*  GLUCOSE 121*   < > 130* 98 98 93 106*  BUN 30*   < > 27* 33* 34* 29* 24*  CREATININE 1.24   < > 1.09 0.91 0.86 0.78 0.69  CALCIUM  8.7*  --  8.3* 8.2* 8.1* 8.2* 8.4*  MG 2.5*  --   --   --  2.5* 2.4 2.5*  PHOS  --   --   --   --   --  2.2* 1.9*   < > = values in this interval not displayed.   Estimated Creatinine Clearance: 70 mL/min (by C-G formula based on SCr of 0.69 mg/dL). Liver & Pancreas: Recent Labs  Lab 02/20/24 2258 02/21/24 0709 02/23/24 0810 02/24/24 0357 02/25/24 0343  AST 33 35 28  --   --   ALT 24 25 18   --   --   ALKPHOS 85 80 84  --   --   BILITOT 0.7 0.5 0.6  --   --   PROT 5.7* 5.2* 4.8*  --   --   ALBUMIN 3.2* 2.9* 2.7* 2.6* 2.8*   No results for input(s): LIPASE, AMYLASE in the last 168 hours. No results for input(s): AMMONIA in the last 168 hours. Diabetic: No results for input(s): HGBA1C in the last 72 hours. No results for input(s): GLUCAP in the last 168 hours. Cardiac Enzymes: No results for input(s): CKTOTAL, CKMB, CKMBINDEX, TROPONINI in the last 168 hours. Recent Labs    02/20/24 2258  PROBNP 7,935.0*   Coagulation Profile: Recent Labs  Lab 02/20/24 2258  INR 1.2   Thyroid  Function Tests: No results for input(s): TSH, T4TOTAL, FREET4, T3FREE, THYROIDAB in the last 72 hours. Lipid Profile: No results for input(s): CHOL, HDL, LDLCALC, TRIG, CHOLHDL, LDLDIRECT in the last 72  hours. Anemia Panel: No results for input(s): VITAMINB12, FOLATE, FERRITIN, TIBC, IRON, RETICCTPCT in the last 72 hours. Urine analysis:    Component Value Date/Time   COLORURINE YELLOW 02/15/2024 0637   APPEARANCEUR CLEAR 02/15/2024 0637   LABSPEC 1.044 (H) 02/15/2024 0637   PHURINE 6.0 02/15/2024 0637   GLUCOSEU >=500 (A) 02/15/2024 0637   HGBUR NEGATIVE  02/15/2024 0637   BILIRUBINUR NEGATIVE 02/15/2024 0637   KETONESUR NEGATIVE 02/15/2024 0637   PROTEINUR NEGATIVE 02/15/2024 0637   NITRITE NEGATIVE 02/15/2024 0637   LEUKOCYTESUR NEGATIVE 02/15/2024 0637   Sepsis Labs: Invalid input(s): PROCALCITONIN, LACTICIDVEN  Microbiology: Recent Results (from the past 240 hours)  Resp panel by RT-PCR (RSV, Flu A&B, Covid) Anterior Nasal Swab     Status: None   Collection Time: 02/20/24 11:02 PM   Specimen: Anterior Nasal Swab  Result Value Ref Range Status   SARS Coronavirus 2 by RT PCR NEGATIVE NEGATIVE Final   Influenza A by PCR NEGATIVE NEGATIVE Final   Influenza B by PCR NEGATIVE NEGATIVE Final    Comment: (NOTE) The Xpert Xpress SARS-CoV-2/FLU/RSV plus assay is intended as an aid in the diagnosis of influenza from Nasopharyngeal swab specimens and should not be used as a sole basis for treatment. Nasal washings and aspirates are unacceptable for Xpert Xpress SARS-CoV-2/FLU/RSV testing.  Fact Sheet for Patients: bloggercourse.com  Fact Sheet for Healthcare Providers: seriousbroker.it  This test is not yet approved or cleared by the [place]  FDA and has been authorized for detection and/or diagnosis of SARS-CoV-2 by FDA under an Emergency Use Authorization (EUA). This EUA will remain in effect (meaning this test can be used) for the duration of the COVID-19 declaration under Section 564(b)(1) of the Act, 21 U.S.C. section 360bbb-3(b)(1), unless the authorization is terminated or revoked.     Resp Syncytial Virus by PCR NEGATIVE NEGATIVE Final    Comment: (NOTE) Fact Sheet for Patients: bloggercourse.com  Fact Sheet for Healthcare Providers: seriousbroker.it  This test is not yet approved or cleared by the [place]  FDA and has been authorized for detection and/or diagnosis of SARS-CoV-2 by FDA under an Emergency  Use Authorization (EUA). This EUA will remain in effect (meaning this test can be used) for the duration of the COVID-19 declaration under Section 564(b)(1) of the Act, 21 U.S.C. section 360bbb-3(b)(1), unless the authorization is terminated or revoked.  Performed at [facility], [address] [place], [place] [address]   Blood Culture (routine x 2)     Status: None (Preliminary result)   Collection Time: 02/20/24 11:02 PM   Specimen: BLOOD  Result Value Ref Range Status   Specimen Description BLOOD SITE NOT SPECIFIED  Final   Special Requests   Final    BOTTLES DRAWN AEROBIC AND ANAEROBIC Blood Culture adequate volume   Culture   Final    NO GROWTH 4 DAYS Performed at [facility], [address] [place], [place] [address]    Report Status PENDING  Incomplete  Blood Culture (routine x 2)     Status: None (Preliminary result)   Collection Time: 02/20/24 11:07 PM   Specimen: BLOOD  Result Value Ref Range Status   Specimen Description BLOOD RIGHT ANTECUBITAL  Final   Special Requests   Final    BOTTLES DRAWN AEROBIC AND ANAEROBIC Blood Culture results may not be optimal due to an inadequate volume of blood received in culture bottles   Culture   Final    NO GROWTH 4 DAYS Performed  at [facility], [address] [place], [place] [address]    Report Status PENDING  Incomplete  Respiratory (~20 pathogens) panel by PCR     Status: None   Collection Time: 02/21/24  6:00 AM   Specimen: Nasopharyngeal Swab; Respiratory  Result Value Ref Range Status   Adenovirus NOT DETECTED NOT DETECTED Final   Coronavirus 229E NOT DETECTED NOT DETECTED Final    Comment: (NOTE) The Coronavirus on the Respiratory Panel, DOES NOT test for the novel  Coronavirus (2019 nCoV)    Coronavirus HKU1 NOT DETECTED NOT DETECTED Final   Coronavirus NL63 NOT DETECTED NOT DETECTED Final   Coronavirus OC43 NOT DETECTED NOT DETECTED Final   Metapneumovirus NOT DETECTED NOT DETECTED Final    Rhinovirus / Enterovirus NOT DETECTED NOT DETECTED Final   Influenza A NOT DETECTED NOT DETECTED Final   Influenza B NOT DETECTED NOT DETECTED Final   Parainfluenza Virus 1 NOT DETECTED NOT DETECTED Final   Parainfluenza Virus 2 NOT DETECTED NOT DETECTED Final   Parainfluenza Virus 3 NOT DETECTED NOT DETECTED Final   Parainfluenza Virus 4 NOT DETECTED NOT DETECTED Final   Respiratory Syncytial Virus NOT DETECTED NOT DETECTED Final   Bordetella pertussis NOT DETECTED NOT DETECTED Final   Bordetella Parapertussis NOT DETECTED NOT DETECTED Final   Chlamydophila pneumoniae NOT DETECTED NOT DETECTED Final   Mycoplasma pneumoniae NOT DETECTED NOT DETECTED Final    Comment: Performed at [facility], [address] [place], [place] [address]    Radiology Studies: No results found.     Hanz Winterhalter T. Leeba Barbe [facility]  If 7PM-7AM, please contact night-coverage [email or web address] 02/25/2024, 12:26 PM   "

## 2024-02-26 ENCOUNTER — Inpatient Hospital Stay (HOSPITAL_COMMUNITY)

## 2024-02-26 DIAGNOSIS — J189 Pneumonia, unspecified organism: Secondary | ICD-10-CM | POA: Diagnosis not present

## 2024-02-26 DIAGNOSIS — J690 Pneumonitis due to inhalation of food and vomit: Secondary | ICD-10-CM | POA: Diagnosis not present

## 2024-02-26 DIAGNOSIS — R1312 Dysphagia, oropharyngeal phase: Secondary | ICD-10-CM | POA: Diagnosis not present

## 2024-02-26 DIAGNOSIS — Z7189 Other specified counseling: Secondary | ICD-10-CM | POA: Diagnosis not present

## 2024-02-26 LAB — CBC
HCT: 29.2 % — ABNORMAL LOW (ref 39.0–52.0)
Hemoglobin: 9.1 g/dL — ABNORMAL LOW (ref 13.0–17.0)
MCH: 28.3 pg (ref 26.0–34.0)
MCHC: 31.2 g/dL (ref 30.0–36.0)
MCV: 90.7 fL (ref 80.0–100.0)
Platelets: 386 K/uL (ref 150–400)
RBC: 3.22 MIL/uL — ABNORMAL LOW (ref 4.22–5.81)
RDW: 16.9 % — ABNORMAL HIGH (ref 11.5–15.5)
WBC: 13.2 K/uL — ABNORMAL HIGH (ref 4.0–10.5)
nRBC: 0 % (ref 0.0–0.2)

## 2024-02-26 LAB — MAGNESIUM: Magnesium: 2.3 mg/dL (ref 1.7–2.4)

## 2024-02-26 LAB — CULTURE, BLOOD (ROUTINE X 2)
Culture: NO GROWTH
Culture: NO GROWTH
Special Requests: ADEQUATE

## 2024-02-26 LAB — RENAL FUNCTION PANEL
Albumin: 2.9 g/dL — ABNORMAL LOW (ref 3.5–5.0)
Anion gap: 11 (ref 5–15)
BUN: 16 mg/dL (ref 8–23)
CO2: 24 mmol/L (ref 22–32)
Calcium: 8.6 mg/dL — ABNORMAL LOW (ref 8.9–10.3)
Chloride: 111 mmol/L (ref 98–111)
Creatinine, Ser: 0.73 mg/dL (ref 0.61–1.24)
GFR, Estimated: >60 mL/min
Glucose, Bld: 111 mg/dL — ABNORMAL HIGH (ref 70–99)
Phosphorus: 2.5 mg/dL (ref 2.5–4.6)
Potassium: 4.1 mmol/L (ref 3.5–5.1)
Sodium: 145 mmol/L (ref 135–145)

## 2024-02-26 MED ORDER — SODIUM CHLORIDE 0.9 % IV SOLN
3.0000 g | Freq: Four times a day (QID) | INTRAVENOUS | Status: AC
  Administered 2024-02-26 – 2024-02-27 (×2): 3 g via INTRAVENOUS
  Filled 2024-02-26 (×2): qty 8

## 2024-02-26 MED ORDER — FUROSEMIDE 10 MG/ML IJ SOLN
40.0000 mg | Freq: Once | INTRAMUSCULAR | Status: AC
  Administered 2024-02-26: 40 mg via INTRAVENOUS
  Filled 2024-02-26: qty 4

## 2024-02-26 NOTE — Progress Notes (Signed)
 " PROGRESS NOTE  William Elliott FMW:[id number] DOB: 1940/08/15   PCP: [name], MD  Patient is from: Home.  Lives with wife.   DOA: 02/20/2024 LOS: 5  Chief complaints Chief Complaint  Patient presents with   Shortness of Breath     Brief Narrative / Interim history: 83 year old M with PMH of CAD/CABG, CVA with dysphagia and dysarthria, systolic CHF, chronic A-fib, HTN, HLD, diverticulosis and GI bleed returning with generalized weakness, shortness of breath and low-grade fever, and admitted with multifocal pneumonia concerning for aspiration pneumonia.  Recently hospitalized for acute GI bleed due to diverticulosis and treated for pneumonia and discharged home.  Evaluated by SLP who recommended NPO.  IR consulted for G-tube placement after discussion with patient and family..  Palliative consulted as well.  CODE STATUS changed to DNR on 12/26.  Subjective: Seen and examined earlier this morning.  Patient had shortness of breath last night.  No desaturation.  CXR with stable pulmonary edema and new retrocardiac opacities.  Received IV Lasix .  Feels well this morning.  Denies shortness of breath.    Assessment and plan: Bilateral multifocal pneumonia: Concerning for aspiration pneumonia and patient with chronic dysphagia.  CT chest showed bilateral infiltrate.  Pro-Cal 0.35.   -Was on vancomycin , cefepime  and Flagyl .  De-escalated to Unasyn , continue.   -Pulmonary toilet with chest PT, IS, mucolytic's and bronchodilators.   -SLP recommended NPO.  IR consulted for G-tube after discussion with patient and family -Gentle IV fluid intermittently and cautiously. -Continue aspiration precaution   Acute on chronic anemia: Recently hospitalized and transfused about 5 units. EGD and colonoscopy last week.  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  Hgb dropped to 6.3 on 12/23.  Improved to 8.1 after 1 unit.  No report of overt bleeding.   Transfused 1 unit with appropriate response on 12/26. Recent Labs    02/18/24 0608 02/19/24 0450 02/20/24 2258 02/20/24 2316 02/21/24 0709 02/22/24 0732 02/23/24 0810 02/24/24 0357 02/25/24 0343 02/26/24 0429  HGB 8.0* 7.9* 8.2* 8.8*  8.5* 7.3* 6.3* 8.1* 7.5* 7.6* 9.1*  -Continue monitoring   Chronic HFrEF: Currently euvolemic.  Good urine output. -Hold further diuretics while NPO.   Oropharyngeal dysphagia -SLP recommended NPO. -IR consulted for G-tube after discussion with patient and son.   Chronic A-fib: Rate controlled.  Not on anticoagulation due to GI bleed.   Debility and physical deconditioning: At baseline, ambulates with rolling walker.  Also has a stair lift. - PT/OT-recommended SNF.  Advance care planning: CODE STATUS changed to DNR/DNI after discussion on 12/26 - Palliative medicine on board  Body mass index is 22.14 kg/m.          DVT prophylaxis:  Place and maintain sequential compression device Start: 02/21/24 0445  Code Status: Full code Family Communication: Updated patient's son at bedside. Level of care: Med-Surg Status is: Inpatient Remains inpatient appropriate because: Aspiration pneumonia, dysphagia   Final disposition: SNF   55 minutes with more than 50% spent in reviewing records, counseling patient/family and coordinating care.  Consultants:  Palliative medicine Interventional radiology  Procedures: None  Microbiology summarized: COVID-19, influenza and RSV PCR nonreactive A-20 pathogen RVP nonreactive Blood cultures NGTD  Objective: Vitals:   02/26/24 0100 02/26/24 0140 02/26/24 0356 02/26/24 0700  BP: (!) 138/56 (!) 139/58 138/65   Pulse:   66   Resp:   15   Temp:   98.5 F (36.9 C)   TempSrc:   Oral  SpO2:   95%   Weight:    70 kg  Height:        Examination:  GENERAL: No apparent distress.  Nontoxic. HEENT: MMM.  Vision and hearing grossly intact.  NECK: Supple.  No apparent JVD.  RESP:  No IWOB.   Fair aeration bilaterally.  CVS:  RRR. Heart sounds normal.  ABD/GI/GU: BS+. Abd soft, NTND.  MSK/EXT:  Moves extremities. No apparent deformity.  Chronic left foot edema. SKIN: no apparent skin lesion or wound NEURO: AA.  Oriented appropriately.  Dysarthria.  No apparent focal neuro deficit. PSYCH: Calm. Normal affect.   Sch Meds:  Scheduled Meds:  ALPRAZolam   0.5 mg Oral Q8H   arformoterol   15 mcg Nebulization BID   And   umeclidinium bromide   1 puff Inhalation Daily   Bempedoic Acid -Ezetimibe   1 tablet Oral Daily   clotrimazole   10 mg Oral 5 X Daily   ferrous sulfate   325 mg Oral Q breakfast   mexiletine  200 mg Oral BID   pantoprazole   40 mg Oral Daily   Continuous Infusions:  ampicillin -sulbactam (UNASYN ) IV 3 g (02/26/24 1131)   PRN Meds:.acetaminophen  **OR** acetaminophen , albuterol , ipratropium-albuterol , lip balm, magic mouthwash w/lidocaine , melatonin, menthol , ondansetron  **OR** ondansetron  (ZOFRAN ) IV  Antimicrobials: Anti-infectives (From admission, onward)    Start     Dose/Rate Route Frequency Ordered Stop   02/23/24 1800  Ampicillin -Sulbactam (UNASYN ) 3 g in sodium chloride  0.9 % 100 mL IVPB        3 g 200 mL/hr over 30 Minutes Intravenous Every 6 hours 02/23/24 1402     02/22/24 1800  ceFEPIme  (MAXIPIME ) 2 g in sodium chloride  0.9 % 100 mL IVPB  Status:  Discontinued        2 g 200 mL/hr over 30 Minutes Intravenous Every 8 hours 02/22/24 1148 02/23/24 1402   02/21/24 2200  vancomycin  (VANCOCIN ) IVPB 1000 mg/200 mL premix  Status:  Discontinued        1,000 mg 200 mL/hr over 60 Minutes Intravenous Every 24 hours 02/21/24 0458 02/22/24 1148   02/21/24 1000  metroNIDAZOLE  (FLAGYL ) IVPB 500 mg  Status:  Discontinued        500 mg 100 mL/hr over 60 Minutes Intravenous Every 12 hours 02/21/24 0445 02/23/24 1402   02/21/24 1000  ceFEPIme  (MAXIPIME ) 2 g in sodium chloride  0.9 % 100 mL IVPB  Status:  Discontinued        2 g 200 mL/hr over 30 Minutes Intravenous  Every 12 hours 02/21/24 0458 02/22/24 1148   02/21/24 0100  azithromycin  (ZITHROMAX ) 500 mg in sodium chloride  0.9 % 250 mL IVPB        500 mg 250 mL/hr over 60 Minutes Intravenous  Once 02/21/24 0053 02/21/24 0309   02/20/24 2315  ceFEPIme  (MAXIPIME ) 2 g in sodium chloride  0.9 % 100 mL IVPB        2 g 200 mL/hr over 30 Minutes Intravenous  Once 02/20/24 2303 02/20/24 2348   02/20/24 2315  metroNIDAZOLE  (FLAGYL ) IVPB 500 mg        500 mg 100 mL/hr over 60 Minutes Intravenous  Once 02/20/24 2303 02/21/24 0019   02/20/24 2315  vancomycin  (VANCOCIN ) IVPB 1000 mg/200 mL premix  Status:  Discontinued        1,000 mg 200 mL/hr over 60 Minutes Intravenous  Once 02/20/24 2303 02/20/24 2306   02/20/24 2315  vancomycin  (VANCOREADY) IVPB 1750 mg/350 mL        1,750 mg 175 mL/hr  over 120 Minutes Intravenous  Once 02/20/24 2306 02/21/24 0224        I have personally reviewed the following labs and images: CBC: Recent Labs  Lab 02/20/24 2258 02/20/24 2316 02/22/24 0732 02/23/24 0810 02/24/24 0357 02/25/24 0343 02/26/24 0429  WBC 13.3*   < > 15.2* 15.1* 13.7* 13.9* 13.2*  NEUTROABS 11.5*  --   --   --   --   --   --   HGB 8.2*   < > 6.3* 8.1* 7.5* 7.6* 9.1*  HCT 25.9*   < > 19.8* 24.8* 23.4* 24.6* 29.2*  MCV 93.8   < > 92.1 89.2 90.7 91.8 90.7  PLT 242   < > 288 301 307 357 386   < > = values in this interval not displayed.   BMP &GFR Recent Labs  Lab 02/20/24 2258 02/20/24 2316 02/22/24 0732 02/23/24 0810 02/24/24 0357 02/25/24 0343 02/26/24 0429  NA 137   < > 142 143 144 146* 145  K 4.2   < > 4.5 3.6 3.6 3.5 4.1  CL 103   < > 113* 112* 112* 113* 111  CO2 25   < > 24 21* 20* 21* 24  GLUCOSE 121*   < > 98 98 93 106* 111*  BUN 30*   < > 33* 34* 29* 24* 16  CREATININE 1.24   < > 0.91 0.86 0.78 0.69 0.73  CALCIUM  8.7*   < > 8.2* 8.1* 8.2* 8.4* 8.6*  MG 2.5*  --   --  2.5* 2.4 2.5* 2.3  PHOS  --   --   --   --  2.2* 1.9* 2.5   < > = values in this interval not displayed.    Estimated Creatinine Clearance: 70.5 mL/min (by C-G formula based on SCr of 0.73 mg/dL). Liver & Pancreas: Recent Labs  Lab 02/20/24 2258 02/21/24 0709 02/23/24 0810 02/24/24 0357 02/25/24 0343 02/26/24 0429  AST 33 35 28  --   --   --   ALT 24 25 18   --   --   --   ALKPHOS 85 80 84  --   --   --   BILITOT 0.7 0.5 0.6  --   --   --   PROT 5.7* 5.2* 4.8*  --   --   --   ALBUMIN 3.2* 2.9* 2.7* 2.6* 2.8* 2.9*   No results for input(s): LIPASE, AMYLASE in the last 168 hours. No results for input(s): AMMONIA in the last 168 hours. Diabetic: No results for input(s): HGBA1C in the last 72 hours. No results for input(s): GLUCAP in the last 168 hours. Cardiac Enzymes: No results for input(s): CKTOTAL, CKMB, CKMBINDEX, TROPONINI in the last 168 hours. Recent Labs    02/20/24 2258  PROBNP 7,935.0*   Coagulation Profile: Recent Labs  Lab 02/20/24 2258  INR 1.2   Thyroid  Function Tests: No results for input(s): TSH, T4TOTAL, FREET4, T3FREE, THYROIDAB in the last 72 hours. Lipid Profile: No results for input(s): CHOL, HDL, LDLCALC, TRIG, CHOLHDL, LDLDIRECT in the last 72 hours. Anemia Panel: No results for input(s): VITAMINB12, FOLATE, FERRITIN, TIBC, IRON, RETICCTPCT in the last 72 hours. Urine analysis:    Component Value Date/Time   COLORURINE YELLOW 02/15/2024 0637   APPEARANCEUR CLEAR 02/15/2024 0637   LABSPEC 1.044 (H) 02/15/2024 0637   PHURINE 6.0 02/15/2024 0637   GLUCOSEU >=500 (A) 02/15/2024 0637   HGBUR NEGATIVE 02/15/2024 0637   BILIRUBINUR NEGATIVE 02/15/2024 9362   THEADORE  NEGATIVE 02/15/2024 0637   PROTEINUR NEGATIVE 02/15/2024 0637   NITRITE NEGATIVE 02/15/2024 0637   LEUKOCYTESUR NEGATIVE 02/15/2024 0637   Sepsis Labs: Invalid input(s): PROCALCITONIN, LACTICIDVEN  Microbiology: Recent Results (from the past 240 hours)  Resp panel by RT-PCR (RSV, Flu A&B, Covid) Anterior Nasal Swab     Status:  None   Collection Time: 02/20/24 11:02 PM   Specimen: Anterior Nasal Swab  Result Value Ref Range Status   SARS Coronavirus 2 by RT PCR NEGATIVE NEGATIVE Final   Influenza A by PCR NEGATIVE NEGATIVE Final   Influenza B by PCR NEGATIVE NEGATIVE Final    Comment: (NOTE) The Xpert Xpress SARS-CoV-2/FLU/RSV plus assay is intended as an aid in the diagnosis of influenza from Nasopharyngeal swab specimens and should not be used as a sole basis for treatment. Nasal washings and aspirates are unacceptable for Xpert Xpress SARS-CoV-2/FLU/RSV testing.  Fact Sheet for Patients: bloggercourse.com  Fact Sheet for Healthcare Providers: seriousbroker.it  This test is not yet approved or cleared by the [place]  FDA and has been authorized for detection and/or diagnosis of SARS-CoV-2 by FDA under an Emergency Use Authorization (EUA). This EUA will remain in effect (meaning this test can be used) for the duration of the COVID-19 declaration under Section 564(b)(1) of the Act, 21 U.S.C. section 360bbb-3(b)(1), unless the authorization is terminated or revoked.     Resp Syncytial Virus by PCR NEGATIVE NEGATIVE Final    Comment: (NOTE) Fact Sheet for Patients: bloggercourse.com  Fact Sheet for Healthcare Providers: seriousbroker.it  This test is not yet approved or cleared by the [place]  FDA and has been authorized for detection and/or diagnosis of SARS-CoV-2 by FDA under an Emergency Use Authorization (EUA). This EUA will remain in effect (meaning this test can be used) for the duration of the COVID-19 declaration under Section 564(b)(1) of the Act, 21 U.S.C. section 360bbb-3(b)(1), unless the authorization is terminated or revoked.  Performed at [facility], [address] [place], [place] [address]   Blood Culture (routine x 2)     Status: None   Collection Time:  02/20/24 11:02 PM   Specimen: BLOOD  Result Value Ref Range Status   Specimen Description BLOOD SITE NOT SPECIFIED  Final   Special Requests   Final    BOTTLES DRAWN AEROBIC AND ANAEROBIC Blood Culture adequate volume   Culture   Final    NO GROWTH 5 DAYS Performed at [facility], [address], [place], [place] [address]    Report Status 02/26/2024 FINAL  Final  Blood Culture (routine x 2)     Status: None   Collection Time: 02/20/24 11:07 PM   Specimen: BLOOD  Result Value Ref Range Status   Specimen Description BLOOD RIGHT ANTECUBITAL  Final   Special Requests   Final    BOTTLES DRAWN AEROBIC AND ANAEROBIC Blood Culture results may not be optimal due to an inadequate volume of blood received in culture bottles   Culture   Final    NO GROWTH 5 DAYS Performed at [facility], [address], [place], [place] [address]    Report Status 02/26/2024 FINAL  Final  Respiratory (~20 pathogens) panel by PCR     Status: None   Collection Time: 02/21/24  6:00 AM   Specimen: Nasopharyngeal Swab; Respiratory  Result Value Ref Range Status   Adenovirus NOT DETECTED NOT DETECTED Final   Coronavirus 229E NOT DETECTED NOT DETECTED Final    Comment: (NOTE)  The Coronavirus on the Respiratory Panel, DOES NOT test for the novel  Coronavirus (2019 nCoV)    Coronavirus HKU1 NOT DETECTED NOT DETECTED Final   Coronavirus NL63 NOT DETECTED NOT DETECTED Final   Coronavirus OC43 NOT DETECTED NOT DETECTED Final   Metapneumovirus NOT DETECTED NOT DETECTED Final   Rhinovirus / Enterovirus NOT DETECTED NOT DETECTED Final   Influenza A NOT DETECTED NOT DETECTED Final   Influenza B NOT DETECTED NOT DETECTED Final   Parainfluenza Virus 1 NOT DETECTED NOT DETECTED Final   Parainfluenza Virus 2 NOT DETECTED NOT DETECTED Final   Parainfluenza Virus 3 NOT DETECTED NOT DETECTED Final   Parainfluenza Virus 4 NOT DETECTED NOT DETECTED Final   Respiratory Syncytial Virus NOT DETECTED NOT DETECTED  Final   Bordetella pertussis NOT DETECTED NOT DETECTED Final   Bordetella Parapertussis NOT DETECTED NOT DETECTED Final   Chlamydophila pneumoniae NOT DETECTED NOT DETECTED Final   Mycoplasma pneumoniae NOT DETECTED NOT DETECTED Final    Comment: Performed at [facility], [address] [place], [place] [address]    Radiology Studies: DG Chest Port 1 View Result Date: 02/26/2024 EXAM: 1 VIEW(S) XRAY OF THE CHEST 02/26/2024 01:02:00 AM COMPARISON: 02/20/2024 CLINICAL HISTORY: SOB (shortness of breath) FINDINGS: LUNGS AND PLEURA: Mild diffuse interstitial pulmonary edema persists, stable since prior examination. New retrocardiac opacification has developed in keeping with progressive atelectasis, infiltrate, or posterolateral pleural fluid. No pneumothorax. Trace right pleural effusion. HEART AND MEDIASTINUM: Stable cardiomegaly. Status post coronary artery bypass grafting. BONES AND SOFT TISSUES: No acute osseous abnormality. IMPRESSION: 1. Mild diffuse interstitial pulmonary edema, stable. 2. New retrocardiac opacification, which may represent progressive atelectasis, infiltrate, or posteriorly layering pleural fluid. 3. Trace right pleural effusion. 4. Stable cardiomegaly Electronically signed by: Dorethia Molt MD 02/26/2024 01:28 AM EST RP Workstation: [id number]       Ujbz T. Keanon Bevins [facility]  If 7PM-7AM, please contact night-coverage [email or web address] 02/26/2024, 1:31 PM   "

## 2024-02-26 NOTE — Progress Notes (Signed)
 Mobility Specialist Progress Note:  RA During Mobility: 70-76 BPM HR, 91% SPO2   02/26/24 1125  Mobility  Activity Ambulated with assistance  Level of Assistance Minimal assist, patient does 75% or more  Assistive Device Front wheel walker  Distance Ambulated (ft) 45 ft  Activity Response Tolerated well  Mobility Referral Yes  Mobility visit 1 Mobility  Mobility Specialist Start Time (ACUTE ONLY) 1045  Mobility Specialist Stop Time (ACUTE ONLY) 1107  Mobility Specialist Time Calculation (min) (ACUTE ONLY) 22 min   Pt was received in bed and agreed to mobility. Min A sit to stand. 1x brief standing break during ambulation. Returned to bed with all needs met. Call bell in reach. Left in room with family.  [employer] - Mobility Specialist

## 2024-02-26 NOTE — Progress Notes (Signed)
" ° ° ° °  Patient Name: William Elliott           DOB: Dec 21, 1940  MRN: [id number]      Admission Date: 02/20/2024  Attending Provider: Kathrin Mignon DASEN, MD  Primary Diagnosis: HCAP (healthcare-associated pneumonia)   Level of care: Med-Surg   OVERNIGHT EVENT  Notified of patient experiencing increased SOB without hypoxia.  He was admitted with bilateral multifocal pneumonia, with concerns for aspiration.  Patient is n.p.o.  Chest x-ray ordered.   The patient reports minimal relief after neb tx. Currently on 2L [place] for comfort.  Bilateral crackles heard upon auscultating lungs.  Has EF of 35-40%.  No IV fluids recently received, however he received blood transfusion earlier this afternoon.    Plan: Chest xray 40 mg IV Lasix  Neb tx PRN  Addendum: Chest xray-  Mild diffuse interstitial pulmonary edema, stable. New retrocardiac opacification, which may represent progressive atelectasis, infiltrate, or posteriorly layering pleural fluid. Trace right pleural effusion. Stable cardiomegaly   Lavanda Horns, DNP, ACNPC- AG [facility] [place]    "

## 2024-02-26 NOTE — Plan of Care (Signed)
" °  Problem: Education: Goal: Knowledge of General Education information will improve Description: Including pain rating scale, medication(s)/side effects and non-pharmacologic comfort measures Outcome: Progressing   Problem: Health Behavior/Discharge Planning: Goal: Ability to manage health-related needs will improve Outcome: Progressing   Problem: Safety: Goal: Ability to remain free from injury will improve Outcome: Progressing   Problem: Respiratory: Goal: Ability to maintain adequate ventilation will improve Outcome: Progressing Goal: Ability to maintain a clear airway will improve Outcome: Progressing   "

## 2024-02-26 NOTE — Progress Notes (Signed)
 "                                                                                                                                                                                                          Daily Progress Note   Patient Name: William Elliott       Date: 02/26/2024 DOB: February 04, 1941  Age: 83 y.o. MRN#: [id number] Attending Physician: Kathrin Mignon DASEN, MD Primary Care Physician: [name], MD Admit Date: 02/20/2024  Reason for Consultation/Follow-up: Establishing goals of care  Subjective:  Resting in bed, talking on phone, patient appears in no distress, discussed with Dr Kathrin, IR note regarding PEG reviewed, TOC note reviewed as well. Discussed with son and CHARITY FUNDRAISER.   Length of Stay: 5  Current Medications: Scheduled Meds:   ALPRAZolam   0.5 mg Oral Q8H   arformoterol   15 mcg Nebulization BID   And   umeclidinium bromide   1 puff Inhalation Daily   Bempedoic Acid -Ezetimibe   1 tablet Oral Daily   clotrimazole   10 mg Oral 5 X Daily   ferrous sulfate   325 mg Oral Q breakfast   mexiletine  200 mg Oral BID   pantoprazole   40 mg Oral Daily    Continuous Infusions:  ampicillin -sulbactam (UNASYN ) IV 3 g (02/26/24 1131)    PRN Meds: acetaminophen  **OR** acetaminophen , albuterol , ipratropium-albuterol , lip balm, magic mouthwash w/lidocaine , melatonin, menthol , ondansetron  **OR** ondansetron  (ZOFRAN ) IV  Physical Exam         Resting in bed No distress Appears with generalized weakness  Vital Signs: BP 138/65 (BP Location: Right Arm)   Pulse 66   Temp 98.5 F (36.9 C) (Oral)   Resp 15   Ht 5' 10 (1.778 m)   Wt 70 kg   SpO2 95%   BMI 22.14 kg/m  SpO2: SpO2: 95 % O2 Device: O2 Device: Room Air O2 Flow Rate:    Intake/output summary:  Intake/Output Summary (Last 24 hours) at 02/26/2024 1153 Last data filed at 02/26/2024 0900 Gross per 24 hour  Intake 1882 ml  Output 1200 ml  Net 682 ml   LBM: Last BM Date : 02/25/24 Baseline Weight: Weight: 71.6 kg Most  recent weight: Weight: 70 kg       Palliative Assessment/Data:      Patient Active Problem List   Diagnosis Date Noted   HCAP (healthcare-associated pneumonia) 02/21/2024   Abnormal CT scan, stomach 02/16/2024   Angiodysplasia of colon with hemorrhage 02/16/2024   Diverticulosis of colon 02/16/2024   Acute bleeding 02/15/2024   Gastrointestinal hemorrhage 09/16/2023  Chronic atrial fibrillation (HCC) 09/14/2023   Acute on chronic systolic heart failure (HCC) 09/13/2023   Symptomatic anemia 09/12/2023   Hematochezia 01/20/2023   Lower GI bleed 01/19/2023   Acute GI bleeding 01/17/2023   ABLA (acute blood loss anemia) 01/17/2023   Diverticulosis 01/17/2023   Non-STEMI (non-ST elevated myocardial infarction) (HCC) 04/19/2020   Permanent atrial fibrillation (HCC) 04/19/2020   PAF (paroxysmal atrial fibrillation) (HCC) 12/10/2015   Aneurysm    Transient confusion    Cerebral thrombosis with cerebral infarction 12/08/2015   Cerebral embolism with cerebral infarction 12/08/2015   Intracerebral hemorrhage 12/08/2015   Confusion 12/07/2015   NSTEMI (non-ST elevated myocardial infarction) (HCC) 03/18/2015   Coronary artery disease involving coronary bypass graft of native heart with unstable angina pectoris (HCC)    ASCVD (arteriosclerotic cardiovascular disease) 03/17/2015   Crescendo angina (HCC) 03/16/2015   Essential hypertension 07/27/2014   Dyspnea 07/26/2014   Benign hypertensive heart disease without heart failure 08/04/2013   BPH associated with nocturia 03/17/2011   Coronary artery disease involving native coronary artery with unstable angina pectoris (HCC)    Hyperlipidemia    Carotid bruit    Demand ischemia (HCC)    Dyslipidemia    COPD (chronic obstructive pulmonary disease) (HCC)     Palliative Care Assessment & Plan   Patient Profile:    Assessment: 83 year old with past medical history of coronary artery disease CABG history of CVA with dysphagia and  dysarthria, history of systolic CHF chronic A-fib hypertension dyslipidemia diverticulosis GI bleed Patient admitted to the hospital with generalized weakness shortness of breath low-grade fever multifocal pneumonia concerning for aspiration pneumonia Status post recent hospitalization for acute GI bleed secondary to diverticulosis Lives at home with wife and his son Evaluated by SLP who have recommended n.p.o., IR consulted for G-tube placement Palliative care for ongoing goals of care discussions  Recommendations/Plan:  Agree with DNR Under IR evaluation for PEG Recommend SNF rehab with palliative.  Reviewed goals of care with son again today - PEG tube to be placed on 02-28-24, patient required PRBC on 02-25-24, recent diverticular bleed, has some baseline insomnia, we reviewed his hospital course thus far, patient to be transferred to 3 W today.     Code Status:    Code Status Orders  (From admission, onward)           Start     Ordered   02/25/24 0931  Do not attempt resuscitation (DNR)- Limited -Do Not Intubate (DNI)  (Code Status)  Continuous       Question Answer Comment  If pulseless and not breathing No CPR or chest compressions.   In Pre-Arrest Conditions (Patient Is Breathing and Has A Pulse) Do not intubate. Provide all appropriate non-invasive medical interventions. Avoid ICU transfer unless indicated or required.   Consent: Discussion documented in EHR or advanced directives reviewed      02/25/24 0931           Code Status History     Date Active Date Inactive Code Status Order ID Comments User Context   02/21/2024 0355 02/25/2024 0931 Full Code [id number]  Debby Camila LABOR, MD ED   02/15/2024 0325 02/19/2024 1615 Full Code [id number]  Layman Raisin, DO ED   09/12/2023 0714 09/16/2023 1538 Full Code [id number]  Georgina Basket, MD ED   01/17/2023 1156 01/22/2023 1808 Full Code [id number]  Pearlean Manus, MD ED   04/19/2020 2231 04/23/2020 2009 Full Code  [id number]  Prentiss Crochet, MD Inpatient   12/07/2015 1048  12/11/2015 1714 Full Code [id number]  Jonel Lonni SQUIBB, MD ED   03/18/2015 1032 03/19/2015 1508 Full Code [id number]  Anner Alm ORN, MD Inpatient   03/17/2015 1646 03/18/2015 1032 Full Code [id number]  Jerilynn Lamarr HERO, NP ED      Advance Directive Documentation    Flowsheet Row Most Recent Value  Type of Advance Directive Healthcare Power of Attorney, Living will  Pre-existing out of facility DNR order (yellow form or pink MOST form) --  MOST Form in Place? --    Prognosis:  Unable to determine  Discharge Planning: Skilled Nursing Facility for rehab with Palliative care service follow-up  Care plan was discussed with IDT Son at bedside, RN  Thank you for allowing the Palliative Medicine Team to assist in the care of this patient. High MDM     Greater than 50%  of this time was spent counseling and coordinating care related to the above assessment and plan.  Lonia Serve, MD  Please contact Palliative Medicine Team phone at [phone] for questions and concerns.       "

## 2024-02-27 DIAGNOSIS — R1312 Dysphagia, oropharyngeal phase: Secondary | ICD-10-CM | POA: Diagnosis not present

## 2024-02-27 DIAGNOSIS — J690 Pneumonitis due to inhalation of food and vomit: Secondary | ICD-10-CM | POA: Diagnosis not present

## 2024-02-27 DIAGNOSIS — J189 Pneumonia, unspecified organism: Secondary | ICD-10-CM | POA: Diagnosis not present

## 2024-02-27 DIAGNOSIS — Z7189 Other specified counseling: Secondary | ICD-10-CM | POA: Diagnosis not present

## 2024-02-27 DIAGNOSIS — E440 Moderate protein-calorie malnutrition: Secondary | ICD-10-CM | POA: Diagnosis not present

## 2024-02-27 LAB — CBC
HCT: 29.6 % — ABNORMAL LOW (ref 39.0–52.0)
Hemoglobin: 9.3 g/dL — ABNORMAL LOW (ref 13.0–17.0)
MCH: 28.7 pg (ref 26.0–34.0)
MCHC: 31.4 g/dL (ref 30.0–36.0)
MCV: 91.4 fL (ref 80.0–100.0)
Platelets: 368 K/uL (ref 150–400)
RBC: 3.24 MIL/uL — ABNORMAL LOW (ref 4.22–5.81)
RDW: 16.7 % — ABNORMAL HIGH (ref 11.5–15.5)
WBC: 14.8 K/uL — ABNORMAL HIGH (ref 4.0–10.5)
nRBC: 0 % (ref 0.0–0.2)

## 2024-02-27 LAB — RENAL FUNCTION PANEL
Albumin: 2.9 g/dL — ABNORMAL LOW (ref 3.5–5.0)
Anion gap: 13 (ref 5–15)
BUN: 17 mg/dL (ref 8–23)
CO2: 22 mmol/L (ref 22–32)
Calcium: 8.5 mg/dL — ABNORMAL LOW (ref 8.9–10.3)
Chloride: 112 mmol/L — ABNORMAL HIGH (ref 98–111)
Creatinine, Ser: 0.68 mg/dL (ref 0.61–1.24)
GFR, Estimated: >60 mL/min
Glucose, Bld: 88 mg/dL (ref 70–99)
Phosphorus: 2.8 mg/dL (ref 2.5–4.6)
Potassium: 4.2 mmol/L (ref 3.5–5.1)
Sodium: 147 mmol/L — ABNORMAL HIGH (ref 135–145)

## 2024-02-27 LAB — MAGNESIUM: Magnesium: 2.1 mg/dL (ref 1.7–2.4)

## 2024-02-27 MED ORDER — BUDESONIDE 0.5 MG/2ML IN SUSP
0.5000 mg | Freq: Two times a day (BID) | RESPIRATORY_TRACT | Status: DC
  Administered 2024-02-27 – 2024-03-07 (×19): 0.5 mg via RESPIRATORY_TRACT
  Filled 2024-02-27 (×7): qty 2

## 2024-02-27 MED ORDER — DEXTROSE-SODIUM CHLORIDE 5-0.45 % IV SOLN: INTRAVENOUS | Status: DC

## 2024-02-27 MED ORDER — ARFORMOTEROL TARTRATE 15 MCG/2ML IN NEBU
15.0000 ug | INHALATION_SOLUTION | Freq: Two times a day (BID) | RESPIRATORY_TRACT | Status: DC
  Administered 2024-02-27 – 2024-03-07 (×18): 15 ug via RESPIRATORY_TRACT
  Filled 2024-02-27 (×6): qty 2

## 2024-02-27 MED ORDER — REVEFENACIN 175 MCG/3ML IN SOLN
175.0000 ug | Freq: Every day | RESPIRATORY_TRACT | Status: DC
  Administered 2024-03-01 – 2024-03-06 (×6): 175 ug via RESPIRATORY_TRACT
  Filled 2024-02-27: qty 3

## 2024-02-27 NOTE — Progress Notes (Signed)
 " PROGRESS NOTE  William Elliott FMW:[id number] DOB: Oct 21, 1940   PCP: [name], MD  Patient is from: Home.  Lives with wife.   DOA: 02/20/2024 LOS: 6  Chief complaints Chief Complaint  Patient presents with   Shortness of Breath     Brief Narrative / Interim history: 83 year old M with PMH of CAD/CABG, CVA with dysphagia and dysarthria, systolic CHF, chronic A-fib, HTN, HLD, diverticulosis and GI bleed returning with generalized weakness, shortness of breath and low-grade fever, and admitted with multifocal pneumonia concerning for aspiration pneumonia.  Recently hospitalized for acute GI bleed due to diverticulosis and treated for pneumonia and discharged home.  Evaluated by SLP who recommended NPO.  IR consulted for G-tube placement after discussion with patient and family..  Palliative consulted as well.  CODE STATUS changed to DNR on 12/26.  Subjective: Seen and examined earlier this morning.  No major events overnight of this morning.  Coughing up some phlegm.  Denies shortness of breath.  Assessment and plan: Bilateral multifocal pneumonia: Concerning for aspiration pneumonia and patient with chronic dysphagia.  CT chest showed bilateral infiltrate.  Pro-Cal 0.35.   - Vancomycin  12/21-12/22.  Zithromax  12/22. -Cefepime  and Flagyl  12/21>> Unasyn  12/24-12/28. -Change inhalers to nebulizers, scheduled and as needed. -Continue IS, mucolytics -SLP recommended NPO.  IR consulted for G-tube after discussion with patient and family -Gentle IV fluid intermittently and cautiously. -Continue aspiration precaution   Acute on chronic anemia: Recently hospitalized and transfused about 5 units. EGD and colonoscopy last week.  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  Hgb dropped to 6.3 on 12/23.  Transfused 1 unit on 12/23 and another unit on 12/26. Recent Labs    02/19/24 0450 02/20/24 2258 02/20/24 2316 02/21/24 0709 02/22/24 0732  02/23/24 0810 02/24/24 0357 02/25/24 0343 02/26/24 0429 02/27/24 0335  HGB 7.9* 8.2* 8.8*  8.5* 7.3* 6.3* 8.1* 7.5* 7.6* 9.1* 9.3*  -Continue monitoring   Chronic HFrEF: Currently euvolemic.  Good urine output. -Hold further diuretics while NPO.   Oropharyngeal dysphagia -SLP recommended NPO. -IR consulted for G-tube after discussion with patient and son.   Chronic A-fib: Rate controlled.  Not on anticoagulation due to GI bleed.   Debility and physical deconditioning: At baseline, ambulates with rolling walker.  Also has a stair lift. - PT/OT-recommended SNF.  Anxiety: Reports taking Xanax  1 mg 3 times daily as needed at home. - Continue Xanax  0.5 mg 3 times daily as needed  Advance care planning: CODE STATUS changed to DNR/DNI after discussion on 12/26 - Palliative medicine on board  Body mass index is 22.14 kg/m.          DVT prophylaxis:  Place and maintain sequential compression device Start: 02/21/24 0445  Code Status: DNR Family Communication: Updated patient's son at bedside. Level of care: Med-Surg Status is: Inpatient Remains inpatient appropriate because: Aspiration pneumonia, dysphagia   Final disposition: SNF   55 minutes with more than 50% spent in reviewing records, counseling patient/family and coordinating care.  Consultants:  Palliative medicine Interventional radiology  Procedures: None  Microbiology summarized: COVID-19, influenza and RSV PCR nonreactive A-20 pathogen RVP nonreactive Blood cultures NGTD  Objective: Vitals:   02/26/24 2311 02/27/24 0625 02/27/24 1054 02/27/24 1217  BP: (!) 122/44 (!) 135/49    Pulse: 72 (!) 48    Resp: 19 18    Temp: 98.3 F (36.8 C) 97.9 F (36.6 C)    TempSrc:  Oral    SpO2: 94%  94% 100% 97%  Weight:      Height:        Examination:  GENERAL: No apparent distress.  Nontoxic. HEENT: MMM.  Vision and hearing grossly intact.  NECK: Supple.  No apparent JVD.  RESP:  No IWOB.  Fair  aeration bilaterally.  CVS:  RRR. Heart sounds normal.  ABD/GI/GU: BS+. Abd soft, NTND.  MSK/EXT:  Moves extremities. No apparent deformity.  1+ BLE edema. SKIN: no apparent skin lesion or wound NEURO: AA.  Oriented appropriately.  Dysarthria.  No apparent focal neuro deficit. PSYCH: Calm. Normal affect.   Sch Meds:  Scheduled Meds:  ALPRAZolam   0.5 mg Oral Q8H   arformoterol   15 mcg Nebulization BID   Bempedoic Acid -Ezetimibe   1 tablet Oral Daily   budesonide  (PULMICORT ) nebulizer solution  0.5 mg Nebulization BID   clotrimazole   10 mg Oral 5 X Daily   ferrous sulfate   325 mg Oral Q breakfast   mexiletine  200 mg Oral BID   pantoprazole   40 mg Oral Daily   revefenacin   175 mcg Nebulization Daily   Continuous Infusions:  dextrose  5 % and 0.45 % NaCl     PRN Meds:.acetaminophen  **OR** acetaminophen , ipratropium-albuterol , lip balm, magic mouthwash w/lidocaine , melatonin, menthol , ondansetron  **OR** ondansetron  (ZOFRAN ) IV  Antimicrobials: Anti-infectives (From admission, onward)    Start     Dose/Rate Route Frequency Ordered Stop   02/26/24 1800  Ampicillin -Sulbactam (UNASYN ) 3 g in sodium chloride  0.9 % 100 mL IVPB        3 g 200 mL/hr over 30 Minutes Intravenous Every 6 hours 02/26/24 1333 02/27/24 0106   02/23/24 1800  Ampicillin -Sulbactam (UNASYN ) 3 g in sodium chloride  0.9 % 100 mL IVPB  Status:  Discontinued        3 g 200 mL/hr over 30 Minutes Intravenous Every 6 hours 02/23/24 1402 02/26/24 1333   02/22/24 1800  ceFEPIme  (MAXIPIME ) 2 g in sodium chloride  0.9 % 100 mL IVPB  Status:  Discontinued        2 g 200 mL/hr over 30 Minutes Intravenous Every 8 hours 02/22/24 1148 02/23/24 1402   02/21/24 2200  vancomycin  (VANCOCIN ) IVPB 1000 mg/200 mL premix  Status:  Discontinued        1,000 mg 200 mL/hr over 60 Minutes Intravenous Every 24 hours 02/21/24 0458 02/22/24 1148   02/21/24 1000  metroNIDAZOLE  (FLAGYL ) IVPB 500 mg  Status:  Discontinued        500 mg 100 mL/hr  over 60 Minutes Intravenous Every 12 hours 02/21/24 0445 02/23/24 1402   02/21/24 1000  ceFEPIme  (MAXIPIME ) 2 g in sodium chloride  0.9 % 100 mL IVPB  Status:  Discontinued        2 g 200 mL/hr over 30 Minutes Intravenous Every 12 hours 02/21/24 0458 02/22/24 1148   02/21/24 0100  azithromycin  (ZITHROMAX ) 500 mg in sodium chloride  0.9 % 250 mL IVPB        500 mg 250 mL/hr over 60 Minutes Intravenous  Once 02/21/24 0053 02/21/24 0309   02/20/24 2315  ceFEPIme  (MAXIPIME ) 2 g in sodium chloride  0.9 % 100 mL IVPB        2 g 200 mL/hr over 30 Minutes Intravenous  Once 02/20/24 2303 02/20/24 2348   02/20/24 2315  metroNIDAZOLE  (FLAGYL ) IVPB 500 mg        500 mg 100 mL/hr over 60 Minutes Intravenous  Once 02/20/24 2303 02/21/24 0019   02/20/24 2315  vancomycin  (VANCOCIN ) IVPB 1000 mg/200 mL premix  Status:  Discontinued        1,000 mg 200 mL/hr over 60 Minutes Intravenous  Once 02/20/24 2303 02/20/24 2306   02/20/24 2315  vancomycin  (VANCOREADY) IVPB 1750 mg/350 mL        1,750 mg 175 mL/hr over 120 Minutes Intravenous  Once 02/20/24 2306 02/21/24 0224        I have personally reviewed the following labs and images: CBC: Recent Labs  Lab 02/20/24 2258 02/20/24 2316 02/23/24 0810 02/24/24 0357 02/25/24 0343 02/26/24 0429 02/27/24 0335  WBC 13.3*   < > 15.1* 13.7* 13.9* 13.2* 14.8*  NEUTROABS 11.5*  --   --   --   --   --   --   HGB 8.2*   < > 8.1* 7.5* 7.6* 9.1* 9.3*  HCT 25.9*   < > 24.8* 23.4* 24.6* 29.2* 29.6*  MCV 93.8   < > 89.2 90.7 91.8 90.7 91.4  PLT 242   < > 301 307 357 386 368   < > = values in this interval not displayed.   BMP &GFR Recent Labs  Lab 02/23/24 0810 02/24/24 0357 02/25/24 0343 02/26/24 0429 02/27/24 0335  NA 143 144 146* 145 147*  K 3.6 3.6 3.5 4.1 4.2  CL 112* 112* 113* 111 112*  CO2 21* 20* 21* 24 22  GLUCOSE 98 93 106* 111* 88  BUN 34* 29* 24* 16 17  CREATININE 0.86 0.78 0.69 0.73 0.68  CALCIUM  8.1* 8.2* 8.4* 8.6* 8.5*  MG 2.5* 2.4 2.5*  2.3 2.1  PHOS  --  2.2* 1.9* 2.5 2.8   Estimated Creatinine Clearance: 70.5 mL/min (by C-G formula based on SCr of 0.68 mg/dL). Liver & Pancreas: Recent Labs  Lab 02/20/24 2258 02/21/24 0709 02/23/24 0810 02/24/24 0357 02/25/24 0343 02/26/24 0429 02/27/24 0335  AST 33 35 28  --   --   --   --   ALT 24 25 18   --   --   --   --   ALKPHOS 85 80 84  --   --   --   --   BILITOT 0.7 0.5 0.6  --   --   --   --   PROT 5.7* 5.2* 4.8*  --   --   --   --   ALBUMIN 3.2* 2.9* 2.7* 2.6* 2.8* 2.9* 2.9*   No results for input(s): LIPASE, AMYLASE in the last 168 hours. No results for input(s): AMMONIA in the last 168 hours. Diabetic: No results for input(s): HGBA1C in the last 72 hours. No results for input(s): GLUCAP in the last 168 hours. Cardiac Enzymes: No results for input(s): CKTOTAL, CKMB, CKMBINDEX, TROPONINI in the last 168 hours. Recent Labs    02/20/24 2258  PROBNP 7,935.0*   Coagulation Profile: Recent Labs  Lab 02/20/24 2258  INR 1.2   Thyroid  Function Tests: No results for input(s): TSH, T4TOTAL, FREET4, T3FREE, THYROIDAB in the last 72 hours. Lipid Profile: No results for input(s): CHOL, HDL, LDLCALC, TRIG, CHOLHDL, LDLDIRECT in the last 72 hours. Anemia Panel: No results for input(s): VITAMINB12, FOLATE, FERRITIN, TIBC, IRON, RETICCTPCT in the last 72 hours. Urine analysis:    Component Value Date/Time   COLORURINE YELLOW 02/15/2024 0637   APPEARANCEUR CLEAR 02/15/2024 0637   LABSPEC 1.044 (H) 02/15/2024 0637   PHURINE 6.0 02/15/2024 0637   GLUCOSEU >=500 (A) 02/15/2024 0637   HGBUR NEGATIVE 02/15/2024 0637   BILIRUBINUR NEGATIVE 02/15/2024 0637   KETONESUR NEGATIVE 02/15/2024 9362  PROTEINUR NEGATIVE 02/15/2024 0637   NITRITE NEGATIVE 02/15/2024 0637   LEUKOCYTESUR NEGATIVE 02/15/2024 0637   Sepsis Labs: Invalid input(s): PROCALCITONIN, LACTICIDVEN  Microbiology: Recent Results (from the past 240  hours)  Resp panel by RT-PCR (RSV, Flu A&B, Covid) Anterior Nasal Swab     Status: None   Collection Time: 02/20/24 11:02 PM   Specimen: Anterior Nasal Swab  Result Value Ref Range Status   SARS Coronavirus 2 by RT PCR NEGATIVE NEGATIVE Final   Influenza A by PCR NEGATIVE NEGATIVE Final   Influenza B by PCR NEGATIVE NEGATIVE Final    Comment: (NOTE) The Xpert Xpress SARS-CoV-2/FLU/RSV plus assay is intended as an aid in the diagnosis of influenza from Nasopharyngeal swab specimens and should not be used as a sole basis for treatment. Nasal washings and aspirates are unacceptable for Xpert Xpress SARS-CoV-2/FLU/RSV testing.  Fact Sheet for Patients: bloggercourse.com  Fact Sheet for Healthcare Providers: seriousbroker.it  This test is not yet approved or cleared by the [place]  FDA and has been authorized for detection and/or diagnosis of SARS-CoV-2 by FDA under an Emergency Use Authorization (EUA). This EUA will remain in effect (meaning this test can be used) for the duration of the COVID-19 declaration under Section 564(b)(1) of the Act, 21 U.S.C. section 360bbb-3(b)(1), unless the authorization is terminated or revoked.     Resp Syncytial Virus by PCR NEGATIVE NEGATIVE Final    Comment: (NOTE) Fact Sheet for Patients: bloggercourse.com  Fact Sheet for Healthcare Providers: seriousbroker.it  This test is not yet approved or cleared by the [place]  FDA and has been authorized for detection and/or diagnosis of SARS-CoV-2 by FDA under an Emergency Use Authorization (EUA). This EUA will remain in effect (meaning this test can be used) for the duration of the COVID-19 declaration under Section 564(b)(1) of the Act, 21 U.S.C. section 360bbb-3(b)(1), unless the authorization is terminated or revoked.  Performed at [facility], [address], [place],  [place] [address]   Blood Culture (routine x 2)     Status: None   Collection Time: 02/20/24 11:02 PM   Specimen: BLOOD  Result Value Ref Range Status   Specimen Description BLOOD SITE NOT SPECIFIED  Final   Special Requests   Final    BOTTLES DRAWN AEROBIC AND ANAEROBIC Blood Culture adequate volume   Culture   Final    NO GROWTH 5 DAYS Performed at [facility], [address] [place], [place] [address]    Report Status 02/26/2024 FINAL  Final  Blood Culture (routine x 2)     Status: None   Collection Time: 02/20/24 11:07 PM   Specimen: BLOOD  Result Value Ref Range Status   Specimen Description BLOOD RIGHT ANTECUBITAL  Final   Special Requests   Final    BOTTLES DRAWN AEROBIC AND ANAEROBIC Blood Culture results may not be optimal due to an inadequate volume of blood received in culture bottles   Culture   Final    NO GROWTH 5 DAYS Performed at [facility], [address] [place], [place] [address]    Report Status 02/26/2024 FINAL  Final  Respiratory (~20 pathogens) panel by PCR     Status: None   Collection Time: 02/21/24  6:00 AM   Specimen: Nasopharyngeal Swab; Respiratory  Result Value Ref Range Status   Adenovirus NOT DETECTED NOT DETECTED Final   Coronavirus 229E NOT DETECTED NOT DETECTED Final    Comment: (NOTE) The Coronavirus on the Respiratory  Panel, DOES NOT test for the novel  Coronavirus (2019 nCoV)    Coronavirus HKU1 NOT DETECTED NOT DETECTED Final   Coronavirus NL63 NOT DETECTED NOT DETECTED Final   Coronavirus OC43 NOT DETECTED NOT DETECTED Final   Metapneumovirus NOT DETECTED NOT DETECTED Final   Rhinovirus / Enterovirus NOT DETECTED NOT DETECTED Final   Influenza A NOT DETECTED NOT DETECTED Final   Influenza B NOT DETECTED NOT DETECTED Final   Parainfluenza Virus 1 NOT DETECTED NOT DETECTED Final   Parainfluenza Virus 2 NOT DETECTED NOT DETECTED Final   Parainfluenza Virus 3 NOT DETECTED NOT DETECTED Final   Parainfluenza Virus 4 NOT DETECTED  NOT DETECTED Final   Respiratory Syncytial Virus NOT DETECTED NOT DETECTED Final   Bordetella pertussis NOT DETECTED NOT DETECTED Final   Bordetella Parapertussis NOT DETECTED NOT DETECTED Final   Chlamydophila pneumoniae NOT DETECTED NOT DETECTED Final   Mycoplasma pneumoniae NOT DETECTED NOT DETECTED Final    Comment: Performed at [facility], [address] [place], [place] [address]    Radiology Studies: No results found.      Danija Gosa T. Jovanka [facility]  If 7PM-7AM, please contact night-coverage [email or web address] 02/27/2024, 3:50 PM  Will "

## 2024-02-27 NOTE — Plan of Care (Signed)
   Problem: Activity: Goal: Risk for activity intolerance will decrease Outcome: Progressing   Problem: Coping: Goal: Level of anxiety will decrease Outcome: Progressing

## 2024-02-27 NOTE — Plan of Care (Signed)
" °  Problem: Clinical Measurements: Goal: Respiratory complications will improve Outcome: Progressing   Problem: Pain Managment: Goal: General experience of comfort will improve and/or be controlled Outcome: Progressing   Problem: Safety: Goal: Ability to remain free from injury will improve Outcome: Progressing   Problem: Skin Integrity: Goal: Risk for impaired skin integrity will decrease Outcome: Progressing   Problem: Activity: Goal: Ability to tolerate increased activity will improve Outcome: Progressing   Problem: Clinical Measurements: Goal: Ability to maintain a body temperature in the normal range will improve Outcome: Progressing   Problem: Respiratory: Goal: Ability to maintain adequate ventilation will improve Outcome: Progressing   "

## 2024-02-28 ENCOUNTER — Inpatient Hospital Stay (HOSPITAL_COMMUNITY)

## 2024-02-28 DIAGNOSIS — E440 Moderate protein-calorie malnutrition: Secondary | ICD-10-CM | POA: Diagnosis not present

## 2024-02-28 DIAGNOSIS — J189 Pneumonia, unspecified organism: Secondary | ICD-10-CM | POA: Diagnosis not present

## 2024-02-28 DIAGNOSIS — R1312 Dysphagia, oropharyngeal phase: Secondary | ICD-10-CM | POA: Diagnosis not present

## 2024-02-28 DIAGNOSIS — J690 Pneumonitis due to inhalation of food and vomit: Secondary | ICD-10-CM | POA: Diagnosis not present

## 2024-02-28 HISTORY — PX: IR GASTROSTOMY TUBE MOD SED: IMG625

## 2024-02-28 LAB — CBC WITH DIFFERENTIAL/PLATELET
Abs Immature Granulocytes: 0.11 K/uL — ABNORMAL HIGH (ref 0.00–0.07)
Basophils Absolute: 0 K/uL (ref 0.0–0.1)
Basophils Relative: 0 %
Eosinophils Absolute: 0 K/uL (ref 0.0–0.5)
Eosinophils Relative: 0 %
HCT: 27.8 % — ABNORMAL LOW (ref 39.0–52.0)
Hemoglobin: 8.6 g/dL — ABNORMAL LOW (ref 13.0–17.0)
Immature Granulocytes: 1 %
Lymphocytes Relative: 7 %
Lymphs Abs: 1.2 K/uL (ref 0.7–4.0)
MCH: 28.2 pg (ref 26.0–34.0)
MCHC: 30.9 g/dL (ref 30.0–36.0)
MCV: 91.1 fL (ref 80.0–100.0)
Monocytes Absolute: 1.1 K/uL — ABNORMAL HIGH (ref 0.1–1.0)
Monocytes Relative: 6 %
Neutro Abs: 14.2 K/uL — ABNORMAL HIGH (ref 1.7–7.7)
Neutrophils Relative %: 86 %
Platelets: 318 K/uL (ref 150–400)
RBC: 3.05 MIL/uL — ABNORMAL LOW (ref 4.22–5.81)
RDW: 16.5 % — ABNORMAL HIGH (ref 11.5–15.5)
WBC: 16.6 K/uL — ABNORMAL HIGH (ref 4.0–10.5)
nRBC: 0 % (ref 0.0–0.2)

## 2024-02-28 LAB — RENAL FUNCTION PANEL
Albumin: 2.7 g/dL — ABNORMAL LOW (ref 3.5–5.0)
Anion gap: 11 (ref 5–15)
BUN: 17 mg/dL (ref 8–23)
CO2: 22 mmol/L (ref 22–32)
Calcium: 8.7 mg/dL — ABNORMAL LOW (ref 8.9–10.3)
Chloride: 110 mmol/L (ref 98–111)
Creatinine, Ser: 0.66 mg/dL (ref 0.61–1.24)
GFR, Estimated: >60 mL/min
Glucose, Bld: 96 mg/dL (ref 70–99)
Phosphorus: 2.6 mg/dL (ref 2.5–4.6)
Potassium: 3.9 mmol/L (ref 3.5–5.1)
Sodium: 142 mmol/L (ref 135–145)

## 2024-02-28 LAB — TYPE AND SCREEN
ABO/RH(D): O POS
Antibody Screen: NEGATIVE
Unit division: 0

## 2024-02-28 LAB — BPAM RBC
Blood Product Expiration Date: 202601102359
ISSUE DATE / TIME: 202512261209
Unit Type and Rh: 5100

## 2024-02-28 LAB — GLUCOSE, CAPILLARY
Glucose-Capillary: 97 mg/dL (ref 70–99)
Glucose-Capillary: 95 mg/dL (ref 70–99)

## 2024-02-28 LAB — MAGNESIUM: Magnesium: 2.2 mg/dL (ref 1.7–2.4)

## 2024-02-28 MED ORDER — FERROUS SULFATE 300 (60 FE) MG/5ML PO SOLN
300.0000 mg | Freq: Every day | ORAL | Status: DC
  Administered 2024-02-28 – 2024-03-07 (×9): 300 mg
  Filled 2024-02-28 (×9): qty 5

## 2024-02-28 MED ORDER — ONDANSETRON HCL 4 MG/2ML IJ SOLN: 4.0000 mg | Freq: Four times a day (QID) | INTRAMUSCULAR | Status: DC | PRN

## 2024-02-28 MED ORDER — CEFAZOLIN SODIUM-DEXTROSE 2-4 GM/100ML-% IV SOLN
2.0000 g | INTRAVENOUS | Status: AC
  Administered 2024-02-28: 2 g via INTRAVENOUS

## 2024-02-28 MED ORDER — LIDOCAINE HCL 1 % IJ SOLN
INTRAMUSCULAR | Status: AC
  Filled 2024-02-28: qty 20

## 2024-02-28 MED ORDER — LIDOCAINE VISCOUS HCL 2 % MT SOLN
OROMUCOSAL | Status: AC
  Filled 2024-02-28: qty 15

## 2024-02-28 MED ORDER — MIDAZOLAM HCL 2 MG/2ML IJ SOLN
INTRAMUSCULAR | Status: AC
  Filled 2024-02-28: qty 2

## 2024-02-28 MED ORDER — FAMOTIDINE 40 MG/5ML PO SUSR
20.0000 mg | Freq: Every day | ORAL | Status: DC
  Administered 2024-02-28 – 2024-03-07 (×9): 20 mg
  Filled 2024-02-28 (×9): qty 2.5

## 2024-02-28 MED ORDER — FENTANYL CITRATE (PF) 100 MCG/2ML IJ SOLN
INTRAMUSCULAR | Status: AC
  Filled 2024-02-28: qty 2

## 2024-02-28 MED ORDER — LIDOCAINE-EPINEPHRINE 1 %-1:100000 IJ SOLN
INTRAMUSCULAR | Status: AC
  Filled 2024-02-28: qty 20

## 2024-02-28 MED ORDER — ACETAMINOPHEN 325 MG PO TABS
650.0000 mg | ORAL_TABLET | Freq: Four times a day (QID) | ORAL | Status: DC | PRN
  Administered 2024-02-28 – 2024-02-29 (×2): 650 mg
  Filled 2024-02-28 (×2): qty 2

## 2024-02-28 MED ORDER — OSMOLITE 1.5 CAL PO LIQD
1000.0000 mL | ORAL | Status: AC
  Administered 2024-02-28: 1000 mL
  Filled 2024-02-28 (×2): qty 1000

## 2024-02-28 MED ORDER — THIAMINE MONONITRATE 100 MG PO TABS
100.0000 mg | ORAL_TABLET | Freq: Every day | ORAL | Status: DC
  Administered 2024-02-28 – 2024-03-02 (×4): 100 mg
  Filled 2024-02-28 (×4): qty 1

## 2024-02-28 MED ORDER — GLUCAGON HCL RDNA (DIAGNOSTIC) 1 MG IJ SOLR
INTRAMUSCULAR | Status: AC
  Filled 2024-02-28: qty 1

## 2024-02-28 MED ORDER — BEMPEDOIC ACID-EZETIMIBE 180-10 MG PO TABS: 1.0000 | ORAL_TABLET | Freq: Every day | ORAL | Status: DC

## 2024-02-28 MED ORDER — MIDAZOLAM HCL (PF) 2 MG/2ML IJ SOLN
INTRAMUSCULAR | Status: DC | PRN
  Administered 2024-02-28: 1 mg via INTRAVENOUS

## 2024-02-28 MED ORDER — ALPRAZOLAM 0.5 MG PO TABS
0.5000 mg | ORAL_TABLET | Freq: Three times a day (TID) | ORAL | Status: DC
  Administered 2024-02-28 – 2024-03-03 (×12): 0.5 mg
  Filled 2024-02-28 (×12): qty 1

## 2024-02-28 MED ORDER — GLUCAGON HCL RDNA (DIAGNOSTIC) 1 MG IJ SOLR
INTRAMUSCULAR | Status: AC | PRN
  Administered 2024-02-28: .5 mg via INTRAVENOUS

## 2024-02-28 MED ORDER — LIDOCAINE HCL URETHRAL/MUCOSAL 2 % EX GEL
1.0000 | Freq: Once | CUTANEOUS | Status: DC
  Filled 2024-02-28: qty 5

## 2024-02-28 MED ORDER — MELATONIN 5 MG PO TABS
5.0000 mg | ORAL_TABLET | Freq: Every evening | ORAL | Status: AC | PRN
  Administered 2024-02-28 – 2024-03-01 (×3): 5 mg via ORAL
  Filled 2024-02-28 (×3): qty 1

## 2024-02-28 MED ORDER — FENTANYL CITRATE (PF) 100 MCG/2ML IJ SOLN
INTRAMUSCULAR | Status: DC | PRN
  Administered 2024-02-28 (×2): 50 ug via INTRAVENOUS

## 2024-02-28 MED ORDER — IOHEXOL 300 MG/ML  SOLN
50.0000 mL | Freq: Once | INTRAMUSCULAR | Status: AC | PRN
  Administered 2024-02-28: 25 mL

## 2024-02-28 MED ORDER — ONDANSETRON HCL 4 MG PO TABS
4.0000 mg | ORAL_TABLET | Freq: Four times a day (QID) | ORAL | Status: DC | PRN
  Administered 2024-03-01: 4 mg
  Filled 2024-02-28: qty 1

## 2024-02-28 MED ORDER — MEXILETINE HCL 200 MG PO CAPS
200.0000 mg | ORAL_CAPSULE | Freq: Two times a day (BID) | ORAL | Status: DC
  Administered 2024-02-28 – 2024-03-03 (×7): 200 mg
  Filled 2024-02-28 (×8): qty 1

## 2024-02-28 MED ORDER — CEFAZOLIN SODIUM-DEXTROSE 2-4 GM/100ML-% IV SOLN
INTRAVENOUS | Status: AC
  Filled 2024-02-28: qty 100

## 2024-02-28 MED ORDER — ACETAMINOPHEN 650 MG RE SUPP: 650.0000 mg | Freq: Four times a day (QID) | RECTAL | Status: DC | PRN

## 2024-02-28 MED ORDER — LIDOCAINE HCL 1 % IJ SOLN
20.0000 mL | Freq: Once | INTRAMUSCULAR | Status: AC
  Administered 2024-02-28: 20 mL via INTRADERMAL
  Filled 2024-02-28: qty 20

## 2024-02-28 NOTE — Procedures (Signed)
 Interventional Radiology Procedure:   Indications: Dysphagia  Procedure: Gastrostomy tube placement  Findings: Placement of 18 Fr gastrostomy tube.  2  T-fasteners were placed.    Complications: None     EBL: Minimal  Plan:  Gastrostomy tube can be used in 4 hours   Deeric Cruise R. Philip, MD  Pager: [phone]

## 2024-02-28 NOTE — Progress Notes (Signed)
 " PROGRESS NOTE  William Elliott FMW:[id number] DOB: [birth year]   PCP: [name], MD  Patient is from: Home.  Lives with wife.   DOA: 02/20/2024 LOS: 7  Chief complaints Chief Complaint  Patient presents with   Shortness of Breath     Brief Narrative / Interim history: 83 year old M with PMH of CAD/CABG, CVA with dysphagia and dysarthria, systolic CHF, chronic A-fib, HTN, HLD, diverticulosis and GI bleed returning with generalized weakness, shortness of breath and low-grade fever, and admitted with multifocal pneumonia concerning for aspiration pneumonia.  Recently hospitalized for acute GI bleed due to diverticulosis and treated for pneumonia and discharged home.  Evaluated by SLP who recommended NPO.  IR consulted for G-tube placement after discussion with patient and family..  Palliative consulted as well.  CODE STATUS changed to DNR on 12/26.  G-tube placed on 12/29.  Tube feed initiated.  Subjective: Seen and examined this afternoon after he returned from G-tube placement.  He is currently sleeping.  [name] son at bedside.  No complaints.  Assessment and plan: Bilateral multifocal pneumonia: Concerning for aspiration pneumonia and patient with chronic dysphagia.  CT chest showed bilateral infiltrate.  Pro-Cal 0.35.   - Vancomycin  12/21-12/22.  Zithromax  12/22. -Cefepime  and Flagyl  12/21>> Unasyn  12/24-12/28. -Change inhalers to nebulizers, scheduled and as needed. -Continue IS, mucolytics -SLP recommended NPO.  G-tube placed on 12/29.  Tube feeds started. -Gentle IV fluid cautiously until tube feed at goal. -Continue aspiration precaution   Acute on chronic anemia: Recently hospitalized and transfused about 5 units. EGD and colonoscopy last week.  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  Hgb dropped to 6.3 on 12/23.  Transfused 1 unit on 12/23 and another unit on 12/26. Recent Labs    02/20/24 2258 02/20/24 2316  02/21/24 0709 02/22/24 0732 02/23/24 0810 02/24/24 0357 02/25/24 0343 02/26/24 0429 02/27/24 0335 02/28/24 0336  HGB 8.2* 8.8*  8.5* 7.3* 6.3* 8.1* 7.5* 7.6* 9.1* 9.3* 8.6*  -Continue monitoring   Chronic HFrEF: Currently euvolemic.  Good urine output. -Hold further diuretics until tube feed at goal.   Oropharyngeal dysphagia -SLP recommended NPO. - G-tube placed by IR on 12/29.  Tube feed initiated   Chronic A-fib: Rate controlled.  Not on anticoagulation due to GI bleed.   Debility and physical deconditioning: At baseline, ambulates with rolling walker.  Also has a stair lift. - PT/OT-recommended SNF.  Anxiety: Reports taking Xanax  1 mg 3 times daily as needed at home. - Continue Xanax  0.5 mg 3 times daily as needed  Advance care planning: CODE STATUS changed to DNR/DNI after discussion on 12/26 - Palliative medicine on board  Moderate malnutrition Body mass index is 22.27 kg/m. Nutrition Problem: Moderate Malnutrition Etiology: chronic illness, dysphagia Signs/Symptoms: mild fat depletion, mild muscle depletion, energy intake < 75% for > 7 days, percent weight loss Interventions: Tube feeding, Refer to RD note for recommendations   DVT prophylaxis:  Place and maintain sequential compression device Start: 02/21/24 0445  Code Status: DNR Family Communication: Updated patient's son at bedside. Level of care: Med-Surg Status is: Inpatient Remains inpatient appropriate because: Aspiration pneumonia, dysphagia   Final disposition: SNF   35 minutes with more than 50% spent in reviewing records, counseling patient/family and coordinating care.  Consultants:  Palliative medicine Interventional radiology  Procedures: None  Microbiology summarized: COVID-19, influenza and RSV PCR nonreactive A-20 pathogen RVP nonreactive Blood cultures NGTD  Objective: Vitals:   02/28/24 1035 02/28/24 1040 02/28/24 1045  02/28/24 1305  BP: (!) 120/55 134/66 129/65   Pulse:  71 73 67   Resp: (!) 23 (!) 30 (!) 24   Temp:      TempSrc:      SpO2: 95% 96% 95% 96%  Weight:      Height:        Examination:  GENERAL: No apparent distress.  Nontoxic. HEENT: MMM.  Vision and hearing grossly intact.  NECK: Supple.  No apparent JVD.  RESP:  No IWOB.  Fair aeration bilaterally.  CVS:  RRR. Heart sounds normal.  ABD/GI/GU: BS+. Abd soft, NTND.  MSK/EXT:  Moves extremities. No apparent deformity.  1+ BLE edema. SKIN: no apparent skin lesion or wound NEURO: Sleeping.  No apparent focal neuro deficit. PSYCH: Calm. Normal affect.   Sch Meds:  Scheduled Meds:  ALPRAZolam   0.5 mg Oral Q8H   arformoterol   15 mcg Nebulization BID   Bempedoic Acid -Ezetimibe   1 tablet Oral Daily   budesonide  (PULMICORT ) nebulizer solution  0.5 mg Nebulization BID   clotrimazole   10 mg Oral 5 X Daily   feeding supplement (OSMOLITE 1.5 CAL)  1,000 mL Per Tube Q24H   ferrous sulfate   325 mg Oral Q breakfast   lidocaine   1 Application Topical Once   mexiletine  200 mg Oral BID   pantoprazole   40 mg Oral Daily   revefenacin   175 mcg Nebulization Daily   thiamine   100 mg Per Tube Daily   Continuous Infusions:  dextrose  5 % and 0.45 % NaCl 55 mL/hr at 02/27/24 1848   PRN Meds:.acetaminophen  **OR** acetaminophen , fentaNYL , glucagon  (human recombinant), ipratropium-albuterol , lip balm, magic mouthwash w/lidocaine , menthol , midazolam  PF, ondansetron  **OR** ondansetron  (ZOFRAN ) IV  Antimicrobials: Anti-infectives (From admission, onward)    Start     Dose/Rate Route Frequency Ordered Stop   02/28/24 1030  ceFAZolin  (ANCEF ) IVPB 2g/100 mL premix        2 g 200 mL/hr over 30 Minutes Intravenous To Radiology 02/28/24 0932 02/28/24 1204   02/26/24 1800  Ampicillin -Sulbactam (UNASYN ) 3 g in sodium chloride  0.9 % 100 mL IVPB        3 g 200 mL/hr over 30 Minutes Intravenous Every 6 hours 02/26/24 1333 02/27/24 0106   02/23/24 1800  Ampicillin -Sulbactam (UNASYN ) 3 g in sodium chloride  0.9 %  100 mL IVPB  Status:  Discontinued        3 g 200 mL/hr over 30 Minutes Intravenous Every 6 hours 02/23/24 1402 02/26/24 1333   02/22/24 1800  ceFEPIme  (MAXIPIME ) 2 g in sodium chloride  0.9 % 100 mL IVPB  Status:  Discontinued        2 g 200 mL/hr over 30 Minutes Intravenous Every 8 hours 02/22/24 1148 02/23/24 1402   02/21/24 2200  vancomycin  (VANCOCIN ) IVPB 1000 mg/200 mL premix  Status:  Discontinued        1,000 mg 200 mL/hr over 60 Minutes Intravenous Every 24 hours 02/21/24 0458 02/22/24 1148   02/21/24 1000  metroNIDAZOLE  (FLAGYL ) IVPB 500 mg  Status:  Discontinued        500 mg 100 mL/hr over 60 Minutes Intravenous Every 12 hours 02/21/24 0445 02/23/24 1402   02/21/24 1000  ceFEPIme  (MAXIPIME ) 2 g in sodium chloride  0.9 % 100 mL IVPB  Status:  Discontinued        2 g 200 mL/hr over 30 Minutes Intravenous Every 12 hours 02/21/24 0458 02/22/24 1148   02/21/24 0100  azithromycin  (ZITHROMAX ) 500 mg in sodium chloride  0.9 % 250  mL IVPB        500 mg 250 mL/hr over 60 Minutes Intravenous  Once 02/21/24 0053 02/21/24 0309   02/20/24 2315  ceFEPIme  (MAXIPIME ) 2 g in sodium chloride  0.9 % 100 mL IVPB        2 g 200 mL/hr over 30 Minutes Intravenous  Once 02/20/24 2303 02/20/24 2348   02/20/24 2315  metroNIDAZOLE  (FLAGYL ) IVPB 500 mg        500 mg 100 mL/hr over 60 Minutes Intravenous  Once 02/20/24 2303 02/21/24 0019   02/20/24 2315  vancomycin  (VANCOCIN ) IVPB 1000 mg/200 mL premix  Status:  Discontinued        1,000 mg 200 mL/hr over 60 Minutes Intravenous  Once 02/20/24 2303 02/20/24 2306   02/20/24 2315  vancomycin  (VANCOREADY) IVPB 1750 mg/350 mL        1,750 mg 175 mL/hr over 120 Minutes Intravenous  Once 02/20/24 2306 02/21/24 0224        I have personally reviewed the following labs and images: CBC: Recent Labs  Lab 02/24/24 0357 02/25/24 0343 02/26/24 0429 02/27/24 0335 02/28/24 0336  WBC 13.7* 13.9* 13.2* 14.8* 16.6*  NEUTROABS  --   --   --   --  14.2*  HGB 7.5*  7.6* 9.1* 9.3* 8.6*  HCT 23.4* 24.6* 29.2* 29.6* 27.8*  MCV 90.7 91.8 90.7 91.4 91.1  PLT 307 357 386 368 318   BMP &GFR Recent Labs  Lab 02/24/24 0357 02/25/24 0343 02/26/24 0429 02/27/24 0335 02/28/24 0336  NA 144 146* 145 147* 142  K 3.6 3.5 4.1 4.2 3.9  CL 112* 113* 111 112* 110  CO2 20* 21* 24 22 22   GLUCOSE 93 106* 111* 88 96  BUN 29* 24* 16 17 17   CREATININE 0.78 0.69 0.73 0.68 0.66  CALCIUM  8.2* 8.4* 8.6* 8.5* 8.7*  MG 2.4 2.5* 2.3 2.1 2.2  PHOS 2.2* 1.9* 2.5 2.8 2.6   Estimated Creatinine Clearance: 70.9 mL/min (by C-G formula based on SCr of 0.66 mg/dL). Liver & Pancreas: Recent Labs  Lab 02/23/24 0810 02/24/24 0357 02/25/24 0343 02/26/24 0429 02/27/24 0335 02/28/24 0336  AST 28  --   --   --   --   --   ALT 18  --   --   --   --   --   ALKPHOS 84  --   --   --   --   --   BILITOT 0.6  --   --   --   --   --   PROT 4.8*  --   --   --   --   --   ALBUMIN 2.7* 2.6* 2.8* 2.9* 2.9* 2.7*   No results for input(s): LIPASE, AMYLASE in the last 168 hours. No results for input(s): AMMONIA in the last 168 hours. Diabetic: No results for input(s): HGBA1C in the last 72 hours. No results for input(s): GLUCAP in the last 168 hours. Cardiac Enzymes: No results for input(s): CKTOTAL, CKMB, CKMBINDEX, TROPONINI in the last 168 hours. Recent Labs    02/20/24 2258  PROBNP 7,935.0*   Coagulation Profile: No results for input(s): INR, PROTIME in the last 168 hours.  Thyroid  Function Tests: No results for input(s): TSH, T4TOTAL, FREET4, T3FREE, THYROIDAB in the last 72 hours. Lipid Profile: No results for input(s): CHOL, HDL, LDLCALC, TRIG, CHOLHDL, LDLDIRECT in the last 72 hours. Anemia Panel: No results for input(s): VITAMINB12, FOLATE, FERRITIN, TIBC, IRON, RETICCTPCT in the last 72 hours.  Urine analysis:    Component Value Date/Time   COLORURINE YELLOW 02/15/2024 0637   APPEARANCEUR CLEAR 02/15/2024  0637   LABSPEC 1.044 (H) 02/15/2024 0637   PHURINE 6.0 02/15/2024 0637   GLUCOSEU >=500 (A) 02/15/2024 0637   HGBUR NEGATIVE 02/15/2024 0637   BILIRUBINUR NEGATIVE 02/15/2024 0637   KETONESUR NEGATIVE 02/15/2024 0637   PROTEINUR NEGATIVE 02/15/2024 0637   NITRITE NEGATIVE 02/15/2024 0637   LEUKOCYTESUR NEGATIVE 02/15/2024 0637   Sepsis Labs: Invalid input(s): PROCALCITONIN, LACTICIDVEN  Microbiology: Recent Results (from the past 240 hours)  Resp panel by RT-PCR (RSV, Flu A&B, Covid) Anterior Nasal Swab     Status: None   Collection Time: 02/20/24 11:02 PM   Specimen: Anterior Nasal Swab  Result Value Ref Range Status   SARS Coronavirus 2 by RT PCR NEGATIVE NEGATIVE Final   Influenza A by PCR NEGATIVE NEGATIVE Final   Influenza B by PCR NEGATIVE NEGATIVE Final    Comment: (NOTE) The Xpert Xpress SARS-CoV-2/FLU/RSV plus assay is intended as an aid in the diagnosis of influenza from Nasopharyngeal swab specimens and should not be used as a sole basis for treatment. Nasal washings and aspirates are unacceptable for Xpert Xpress SARS-CoV-2/FLU/RSV testing.  Fact Sheet for Patients: bloggercourse.com  Fact Sheet for Healthcare Providers: seriousbroker.it  This test is not yet approved or cleared by the [place]  FDA and has been authorized for detection and/or diagnosis of SARS-CoV-2 by FDA under an Emergency Use Authorization (EUA). This EUA will remain in effect (meaning this test can be used) for the duration of the COVID-19 declaration under Section 564(b)(1) of the Act, 21 U.S.C. section 360bbb-3(b)(1), unless the authorization is terminated or revoked.     Resp Syncytial Virus by PCR NEGATIVE NEGATIVE Final    Comment: (NOTE) Fact Sheet for Patients: bloggercourse.com  Fact Sheet for Healthcare Providers: seriousbroker.it  This test is not yet approved or  cleared by the [place]  FDA and has been authorized for detection and/or diagnosis of SARS-CoV-2 by FDA under an Emergency Use Authorization (EUA). This EUA will remain in effect (meaning this test can be used) for the duration of the COVID-19 declaration under Section 564(b)(1) of the Act, 21 U.S.C. section 360bbb-3(b)(1), unless the authorization is terminated or revoked.  Performed at [facility], [address], [place], [place] [address]   Blood Culture (routine x 2)     Status: None   Collection Time: 02/20/24 11:02 PM   Specimen: BLOOD  Result Value Ref Range Status   Specimen Description BLOOD SITE NOT SPECIFIED  Final   Special Requests   Final    BOTTLES DRAWN AEROBIC AND ANAEROBIC Blood Culture adequate volume   Culture   Final    NO GROWTH 5 DAYS Performed at [facility], [address] [place], [place] [address]    Report Status 02/26/2024 FINAL  Final  Blood Culture (routine x 2)     Status: None   Collection Time: 02/20/24 11:07 PM   Specimen: BLOOD  Result Value Ref Range Status   Specimen Description BLOOD RIGHT ANTECUBITAL  Final   Special Requests   Final    BOTTLES DRAWN AEROBIC AND ANAEROBIC Blood Culture results may not be optimal due to an inadequate volume of blood received in culture bottles   Culture   Final    NO GROWTH 5 DAYS Performed at [facility], [address] [place], [place] [address]    Report Status 02/26/2024 FINAL  Final  Respiratory (~20 pathogens) panel by PCR     Status: None   Collection Time: 02/21/24  6:00 AM   Specimen: Nasopharyngeal Swab; Respiratory  Result Value Ref Range Status   Adenovirus NOT DETECTED NOT DETECTED Final   Coronavirus 229E NOT DETECTED NOT DETECTED Final    Comment: (NOTE) The Coronavirus on the Respiratory Panel, DOES NOT test for the novel  Coronavirus (2019 nCoV)    Coronavirus HKU1 NOT DETECTED NOT DETECTED Final   Coronavirus NL63 NOT DETECTED NOT DETECTED Final    Coronavirus OC43 NOT DETECTED NOT DETECTED Final   Metapneumovirus NOT DETECTED NOT DETECTED Final   Rhinovirus / Enterovirus NOT DETECTED NOT DETECTED Final   Influenza A NOT DETECTED NOT DETECTED Final   Influenza B NOT DETECTED NOT DETECTED Final   Parainfluenza Virus 1 NOT DETECTED NOT DETECTED Final   Parainfluenza Virus 2 NOT DETECTED NOT DETECTED Final   Parainfluenza Virus 3 NOT DETECTED NOT DETECTED Final   Parainfluenza Virus 4 NOT DETECTED NOT DETECTED Final   Respiratory Syncytial Virus NOT DETECTED NOT DETECTED Final   Bordetella pertussis NOT DETECTED NOT DETECTED Final   Bordetella Parapertussis NOT DETECTED NOT DETECTED Final   Chlamydophila pneumoniae NOT DETECTED NOT DETECTED Final   Mycoplasma pneumoniae NOT DETECTED NOT DETECTED Final    Comment: Performed at [facility], [address] [place], [place] [address]    Radiology Studies: IR GASTROSTOMY TUBE MOD SED Result Date: 02/28/2024 INDICATION: 83 year old with dysphagia and aspiration. EXAM: PLACEMENT A PERCUTANEOUS GASTROSTOMY TUBE WITH FLUOROSCOPIC GUIDANCE Physician: Juliene SAUNDERS. Henn, MD MEDICATIONS: Ancef  2 g; Antibiotics were administered within 1 hour of the procedure. Glucagon  0.5 mg ANESTHESIA/SEDATION: Moderate (conscious) sedation was employed during this procedure. A total of Versed  1mg  and fentanyl  100 mcg was administered intravenously at the order of the provider performing the procedure. Total intra-service moderate sedation time: 29 minutes. [name] level of consciousness and vital signs were monitored continuously by radiology nurse throughout the procedure under the supervision of the provider performing the procedure. FLUOROSCOPY: Radiation Exposure Index (as provided by the fluoroscopic device): 41 mGy Kerma COMPLICATIONS: None immediate. PROCEDURE: The procedure was explained to the patient and family. The risks and benefits of the procedure were discussed and questions were addressed. Informed  consent was obtained from the patient/family. Anterior abdomen was prepped and draped in sterile fashion. Maximal barrier sterile technique was utilized including caps, mask, sterile gowns, sterile gloves, sterile drape, hand hygiene and skin antiseptic. Nasogastric tube was placed with fluoroscopy. The stomach was insufflated through the nasogastric tube. Skin was anesthetized using 1% lidocaine . Using fluoroscopic guidance, a Saf-T-Pexy T fastener was deployed within the stomach. A second Saf-T-Pexy T fastener was deployed using fluoroscopic guidance. An incision was made between the T-fasteners. Needle was directed through this incision into the stomach using fluoroscopic guidance. Wire was advanced into the stomach. 8 mm x 80 mm Athletis balloon and 18 French balloon retention gastrostomy tube were loaded on the wire. The angioplasty balloon was inflated across the stomach wall and in anterior abdominal soft tissues. Angioplasty balloon was deflated as the gastrostomy tube was advanced over the balloon into the stomach. Gastrostomy tube balloon was inflated with very dilute contrast. Wire and angioplasty balloon were removed. Contrast injection confirmed placement within the stomach. FINDINGS: Gastrostomy tube confirmed within the stomach. IMPRESSION: 1. Successful placement of a percutaneous gastrostomy tube using fluoroscopic guidance. 2. T-fasteners can be cut and removed in 2 weeks. Electronically Signed   By: Juliene  Philip M.D.   On: 02/28/2024 12:13   DG Abd Portable 1V Result Date: 02/28/2024 EXAM: 1 VIEW XRAY OF THE ABDOMEN 02/28/2024 05:24:00 AM COMPARISON: CT abdomen and pelvis 02/15/2024. CLINICAL HISTORY: Gastrostomy status (HCC) 14941 FINDINGS: LINES, TUBES AND DEVICES: Gastrostomy tube balloon not well seen. BOWEL: Nonobstructive bowel gas pattern. The enteric contrast material is identified within the colon up to the level of the rectum. SOFT TISSUES: Sternotomy wires and surgical clips noted.  Right upper quadrant surgical clips noted. BONES: No acute fracture. LUNGS: Bibasilar airspace opacities. IMPRESSION: 1. Gastrostomy tube balloon is not well visualized on this exam; consider further evaluation if there is concern for malposition. Electronically signed by: Waddell Calk MD 02/28/2024 06:58 AM EST RP Workstation: [id number]        Mignon T. Jerremy Maione [facility]  If 7PM-7AM, please contact night-coverage [email or web address] 02/28/2024, 1:23 PM  Will "

## 2024-02-28 NOTE — Progress Notes (Signed)
 Initial Nutrition Assessment  DOCUMENTATION CODES:   Non-severe (moderate) malnutrition in context of chronic illness  INTERVENTION:   Monitor magnesium , potassium, and phosphorus daily for at least 3 days, MD to replete as needed, as pt is at risk for refeeding syndrome.  -Recommend Thiamine  100 mg daily for 7 days   -Initiate Osmolite 1.5 @ 20 ml/hr via PEG to ensure tolerance.  Starting 12/30, will start transition to bolus-gravity feeds. -Goal will be 5 cartons of Osmolite 1.5 daily. -Provides 1775 kcals, 74g protein and 905 ml H2O -Free water recommendations: 120 ml TID  NUTRITION DIAGNOSIS:   Moderate Malnutrition related to chronic illness, dysphagia as evidenced by mild fat depletion, mild muscle depletion, energy intake < 75% for > 7 days, percent weight loss.  GOAL:   Patient will meet greater than or equal to 90% of their needs  MONITOR:   TF tolerance, Labs  REASON FOR ASSESSMENT:   Consult Enteral/tube feeding initiation and management  ASSESSMENT:   83 year old M with PMH of CAD/CABG, CVA with dysphagia and dysarthria, systolic CHF, chronic A-fib, HTN, HLD, diverticulosis and GI bleed returning with generalized weakness, shortness of breath and low-grade fever, and admitted with multifocal pneumonia concerning for aspiration pneumonia.  Patient in room, son at bedside. Pt just had PEG placed by IR. Per son, pt has had no PO for over a week. States he was eating during his admission at [facility] but has not eaten since admitted at [facility] 12/21 (8 days). Pt will be at refeeding risk given poor PO lately.  Per IR note, able to use PEG in 4 hours. Will place orders to start later today. Goal will be to transition to bolus gravity feeds tomorrow and advance slowly to goal. Reviewed plan with son who appreciated visit.  Per weight records, pt has lost 26 lbs since 4/17 (14% wt loss x 7 months, significant for time frame).  Medications: Ferrous sulfate , D5 infusion  Labs  reviewed.   NUTRITION - FOCUSED PHYSICAL EXAM:  Flowsheet Row Most Recent Value  Orbital Region Mild depletion  Upper Arm Region Mild depletion  Thoracic and Lumbar Region Mild depletion  Buccal Region Mild depletion  Temple Region Mild depletion  Clavicle Bone Region Mild depletion  Clavicle and Acromion Bone Region Mild depletion  Scapular Bone Region Mild depletion  Dorsal Hand Mild depletion  Patellar Region Mild depletion  Anterior Thigh Region Mild depletion  Posterior Calf Region Mild depletion  Edema (RD Assessment) None  Hair Reviewed  Eyes Reviewed  Mouth Reviewed  Skin Reviewed  Nails Reviewed    Diet Order:   Diet Order             Diet NPO time specified Except for: Sips with Meds  Diet effective midnight                   EDUCATION NEEDS:   Education needs have been addressed  Skin:  Skin Assessment: Reviewed RN Assessment  Last BM:  12/26  Height:   Ht Readings from Last 1 Encounters:  02/20/24 5' 10 (1.778 m)    Weight:   Wt Readings from Last 1 Encounters:  02/28/24 70.4 kg    BMI:  Body mass index is 22.27 kg/m.  Estimated Nutritional Needs:   Kcal:  1750-1950  Protein:  80-90g  Fluid:  1.9L/day  Morna Lee, MS, RD, LDN Inpatient Clinical Dietitian Contact via Secure chat

## 2024-02-28 NOTE — Plan of Care (Signed)
 PMT no charge note  83 year old with past medical history of coronary artery disease CABG history of CVA with dysphagia and dysarthria, history of systolic CHF chronic A-fib hypertension dyslipidemia diverticulosis GI bleed Patient admitted to the hospital with generalized weakness shortness of breath low-grade fever multifocal pneumonia concerning for aspiration pneumonia Status post recent hospitalization for acute GI bleed secondary to diverticulosis Lives at home with wife and his son Evaluated by SLP who have recommended n.p.o., IR consulted for G-tube placement Palliative care for ongoing goals of care discussions.   Patient off the floor for PEG procedure Chart reviewed No new inpatient PMT specific recommendations SNF rehab with palliative on discharge. PMT will follow peripherally.  No charge Lonia Serve MD.  [facility] palliative.

## 2024-02-28 NOTE — Plan of Care (Signed)

## 2024-02-28 NOTE — Progress Notes (Signed)
 PT Cancellation Note  Patient Details Name: William Elliott MRN: [id number] DOB: Feb 10, 1941   Cancelled Treatment:     Procedure: Gastrostomy tube placement   Findings: Placement of 18 Fr gastrostomy tube.    Katheryn Leap  PTA Acute  Rehabilitation Services Office M-F          [phone]

## 2024-02-28 NOTE — Plan of Care (Signed)
  Problem: Safety: Goal: Ability to remain free from injury will improve Outcome: Progressing   Problem: Pain Managment: Goal: General experience of comfort will improve and/or be controlled Outcome: Progressing   Problem: Elimination: Goal: Will not experience complications related to urinary retention Outcome: Progressing   Problem: Coping: Goal: Level of anxiety will decrease Outcome: Progressing   Problem: Activity: Goal: Risk for activity intolerance will decrease Outcome: Progressing

## 2024-02-28 NOTE — Progress Notes (Signed)
 Patient requested melatonin and stated he won't sleep without it. Messaged Lynwood Kipper.

## 2024-02-28 NOTE — TOC Progression Note (Addendum)
 Transition of Care [facility]) - Progression Note    Patient Details  Name: William Elliott MRN: [id number] Date of Birth: 1940-07-06  Transition of Care [facility]) CM/SW Contact  Heather DELENA Saltness, LCSW Phone Number: 02/28/2024, 10:48 AM  Clinical Narrative:     ADDENDUM  Pt currently in IR procedure, getting PEG tube placed. CSW notified [facility] at [facility]. [facility] reports ability to accept pt with PEG tube placed. Pt not medically stable. Will start insurance authorization once medically cleared.  CSW spoke with pt's son, Anis Degidio [phone], via phone call to discuss SNF bed offers and obtain preferred facility. CSW provided pt's son with list of SNF locations with available beds including name of facility, location, and Medicare Star-Rating. [name] chooses bed at [facility]. MD notified.   Medicare [facility] [address] [place], [place] [address] [phone] Overall rating ???? Above average  [facility] [address] [place], [place] [address] [phone] Overall rating ??? Much above average  [facility] [address] [place], [place] [address] [phone] Overall rating ????? Much above average     Expected Discharge Plan and Services  [facility]   Social Drivers of Health (SDOH) Interventions SDOH Screenings   Food Insecurity: No Food Insecurity (02/21/2024)  Housing: Low Risk (02/21/2024)  Transportation Needs: No Transportation Needs (02/21/2024)  Utilities: Not At Risk (02/21/2024)  Alcohol Screen: Low Risk (09/14/2023)  Financial Resource Strain: Low Risk (09/14/2023)  Social Connections: Moderately Integrated (02/21/2024)  Tobacco Use: Medium Risk (02/20/2024)    Readmission Risk Interventions    09/13/2023    3:37 PM 01/22/2023   11:58 AM  Readmission Risk Prevention Plan  Post Dischage Appt  Complete  Medication Screening  Complete   Transportation Screening Complete Complete  PCP or Specialist Appt within 5-7 Days Complete   Home Care Screening Complete   Medication Review (RN CM) Complete     Signed: Heather Saltness, MSW, LCSW Clinical Social Worker Inpatient Care Management 02/28/2024 10:55 AM

## 2024-02-29 ENCOUNTER — Inpatient Hospital Stay (HOSPITAL_COMMUNITY)

## 2024-02-29 DIAGNOSIS — J690 Pneumonitis due to inhalation of food and vomit: Secondary | ICD-10-CM | POA: Diagnosis not present

## 2024-02-29 DIAGNOSIS — Z7189 Other specified counseling: Secondary | ICD-10-CM | POA: Diagnosis not present

## 2024-02-29 DIAGNOSIS — J189 Pneumonia, unspecified organism: Secondary | ICD-10-CM | POA: Diagnosis not present

## 2024-02-29 DIAGNOSIS — R1312 Dysphagia, oropharyngeal phase: Secondary | ICD-10-CM | POA: Diagnosis not present

## 2024-02-29 LAB — GLUCOSE, CAPILLARY
Glucose-Capillary: 121 mg/dL — ABNORMAL HIGH (ref 70–99)
Glucose-Capillary: 116 mg/dL — ABNORMAL HIGH (ref 70–99)
Glucose-Capillary: 115 mg/dL — ABNORMAL HIGH (ref 70–99)
Glucose-Capillary: 132 mg/dL — ABNORMAL HIGH (ref 70–99)
Glucose-Capillary: 167 mg/dL — ABNORMAL HIGH (ref 70–99)

## 2024-02-29 LAB — RENAL FUNCTION PANEL
Albumin: 2.6 g/dL — ABNORMAL LOW (ref 3.5–5.0)
Anion gap: 8 (ref 5–15)
BUN: 21 mg/dL (ref 8–23)
CO2: 24 mmol/L (ref 22–32)
Calcium: 8.5 mg/dL — ABNORMAL LOW (ref 8.9–10.3)
Chloride: 112 mmol/L — ABNORMAL HIGH (ref 98–111)
Creatinine, Ser: 0.72 mg/dL (ref 0.61–1.24)
GFR, Estimated: >60 mL/min
Glucose, Bld: 133 mg/dL — ABNORMAL HIGH (ref 70–99)
Phosphorus: 2.9 mg/dL (ref 2.5–4.6)
Potassium: 3.9 mmol/L (ref 3.5–5.1)
Sodium: 144 mmol/L (ref 135–145)

## 2024-02-29 LAB — CBC
HCT: 27.1 % — ABNORMAL LOW (ref 39.0–52.0)
Hemoglobin: 8.5 g/dL — ABNORMAL LOW (ref 13.0–17.0)
MCH: 28.5 pg (ref 26.0–34.0)
MCHC: 31.4 g/dL (ref 30.0–36.0)
MCV: 90.9 fL (ref 80.0–100.0)
Platelets: 288 K/uL (ref 150–400)
RBC: 2.98 MIL/uL — ABNORMAL LOW (ref 4.22–5.81)
RDW: 16.3 % — ABNORMAL HIGH (ref 11.5–15.5)
WBC: 14.1 K/uL — ABNORMAL HIGH (ref 4.0–10.5)
nRBC: 0 % (ref 0.0–0.2)

## 2024-02-29 LAB — SARS CORONAVIRUS 2 BY RT PCR: SARS Coronavirus 2 by RT PCR: NEGATIVE

## 2024-02-29 LAB — PRO BRAIN NATRIURETIC PEPTIDE: Pro Brain Natriuretic Peptide: 9680 pg/mL — ABNORMAL HIGH

## 2024-02-29 LAB — MAGNESIUM: Magnesium: 2.4 mg/dL (ref 1.7–2.4)

## 2024-02-29 MED ORDER — OSMOLITE 1.5 CAL PO LIQD
120.0000 mL | Freq: Four times a day (QID) | ORAL | Status: DC
  Administered 2024-02-29 – 2024-03-03 (×9): 120 mL
  Filled 2024-02-29 (×13): qty 237

## 2024-02-29 MED ORDER — IOHEXOL 300 MG/ML  SOLN
75.0000 mL | Freq: Once | INTRAMUSCULAR | Status: AC | PRN
  Administered 2024-02-29: 75 mL via INTRAVENOUS

## 2024-02-29 MED ORDER — FUROSEMIDE 10 MG/ML IJ SOLN
20.0000 mg | Freq: Once | INTRAMUSCULAR | Status: AC
  Administered 2024-02-29: 20 mg via INTRAVENOUS
  Filled 2024-02-29: qty 2

## 2024-02-29 MED ORDER — METHYLPREDNISOLONE SODIUM SUCC 125 MG IJ SOLR
80.0000 mg | Freq: Once | INTRAMUSCULAR | Status: AC
  Administered 2024-02-29: 80 mg via INTRAVENOUS
  Filled 2024-02-29: qty 2

## 2024-02-29 MED ORDER — FUROSEMIDE 10 MG/ML IJ SOLN
40.0000 mg | Freq: Once | INTRAMUSCULAR | Status: AC
  Administered 2024-02-29: 40 mg via INTRAVENOUS
  Filled 2024-02-29: qty 4

## 2024-02-29 MED ORDER — PIPERACILLIN-TAZOBACTAM 3.375 G IVPB
3.3750 g | Freq: Three times a day (TID) | INTRAVENOUS | Status: DC
  Administered 2024-02-29 – 2024-03-07 (×20): 3.375 g via INTRAVENOUS
  Filled 2024-02-29 (×21): qty 50

## 2024-02-29 NOTE — Progress Notes (Signed)
 Physical Therapy Treatment Patient Details Name: William Elliott MRN: [id number] DOB: 07-Apr-1940 Today's Date: 02/29/2024   History of Present Illness 83 yo male admitted with anemia s/p transfusion, Pna, weakness. Recent d/c 12/21. PMH:  coronary artery disease, status post prior CABG, NSTEMI, GI bleed, falls, hemorrhagic shock, anemia, Afib, CHF,    PT Comments  PT - Cognition Comments: AxO x 2 pleasant and willing.  Max c/o weakness/fatigue/NO energy (days of NPO) before G Tube placement 02/28/24  Assisted OOB was difficult.  General bed mobility comments: Pt required increased assist this session due to increased c/o dizziness and Max c/o fatigue/weakness. General transfer comment: Pt required increased assist to transfer from bed to [facility] then from [facility] to walker.  MAX c/o weakness/fatigue General Gait Details: VERY limited amb distance due to increased c/o weakness/fatigue.  Each PT session, Pt declining in his mobility.  Suspect days of NPO. Assisted back to bed per Pt request.  LPT has rec SNF.     If plan is discharge home, recommend the following: A little help with walking and/or transfers;A little help with bathing/dressing/bathroom;Assistance with cooking/housework;Assist for transportation;Help with stairs or ramp for entrance   Can travel by private vehicle     No  Equipment Recommendations  None recommended by PT    Recommendations for Other Services       Precautions / Restrictions Precautions Precautions: Fall Precaution/Restrictions Comments: G Tube, NPO X Ice chips     Mobility  Bed Mobility Overal bed mobility: Needs Assistance Bed Mobility: Supine to Sit, Sit to Supine     Supine to sit: Mod assist, Max assist Sit to supine: Max assist, Total assist   General bed mobility comments: Pt required increased assist this session due to increased c/o dizziness and Max c/o fatigue/weakness.    Transfers Overall transfer level: Needs assistance Equipment  used: Rolling walker (2 wheels) Transfers: Sit to/from Stand Sit to Stand: Mod assist, +2 physical assistance, +2 safety/equipment           General transfer comment: Pt required increased assist to transfer from bed to [facility] then from [facility] to walker.  MAX c/o weakness/fatigue    Ambulation/Gait Ambulation/Gait assistance: Mod assist Gait Distance (Feet): 8 Feet Assistive device: Rolling walker (2 wheels) Gait Pattern/deviations: Step-through pattern, Decreased stride length Gait velocity: decreased     General Gait Details: VERY limited amb distance due to increased c/o weakness/fatigue.  Each PT session, Pt declining in his mobility.  Suspect days of NPO.   Stairs             Wheelchair Mobility     Tilt Bed    Modified Rankin (Stroke Patients Only)       Balance                                            Communication Communication Communication: Impaired Factors Affecting Communication: Reduced clarity of speech;Hearing impaired  Cognition Arousal: Alert Behavior During Therapy: WFL for tasks assessed/performed   PT - Cognitive impairments: No apparent impairments                       PT - Cognition Comments: AxO x 2 pleasant and willing.  Max c/o weakness/fatigue/NO energy (days of NPO) before G Tube placement 02/28/24 Following commands: Intact      Cueing Cueing Techniques: Verbal cues  Exercises      General Comments        Pertinent Vitals/Pain      Home Living                          Prior Function            PT Goals (current goals can now be found in the care plan section) Progress towards PT goals: Progressing toward goals    Frequency    Min 3X/week      PT Plan      Co-evaluation              AM-PAC PT 6 Clicks Mobility   Outcome Measure  Help needed turning from your back to your side while in a flat bed without using bedrails?: A Lot Help needed moving from lying  on your back to sitting on the side of a flat bed without using bedrails?: A Lot Help needed moving to and from a bed to a chair (including a wheelchair)?: A Lot Help needed standing up from a chair using your arms (e.g., wheelchair or bedside chair)?: A Lot Help needed to walk in hospital room?: A Lot Help needed climbing 3-5 steps with a railing? : Total 6 Click Score: 11    End of Session Equipment Utilized During Treatment: Gait belt Activity Tolerance: Patient limited by fatigue Patient left: in bed;with call bell/phone within reach;with bed alarm set;with family/visitor present Nurse Communication: Mobility status PT Visit Diagnosis: Muscle weakness (generalized) (M62.81);Difficulty in walking, not elsewhere classified (R26.2);Unsteadiness on feet (R26.81);History of falling (Z91.81)     Time: 8957-8889 PT Time Calculation (min) (ACUTE ONLY): 28 min  Charges:    $Gait Training: 8-22 mins $Therapeutic Activity: 8-22 mins PT General Charges $$ ACUTE PT VISIT: 1 Visit                     Katheryn Leap  PTA Acute  Rehabilitation Services Office M-F          [phone]

## 2024-02-29 NOTE — Progress Notes (Signed)
 " PROGRESS NOTE  William Elliott FMW:[id number] DOB: January 21, 1941   PCP: [name], MD  Patient is from: Home.  Lives with wife.   DOA: 02/20/2024 LOS: 8  Chief complaints Chief Complaint  Patient presents with   Shortness of Breath     Brief Narrative / Interim history: 83 year old M with PMH of CAD/CABG, CVA with dysphagia and dysarthria, systolic CHF, chronic A-fib, HTN, HLD, diverticulosis and GI bleed returning with generalized weakness, shortness of breath and low-grade fever, and admitted with multifocal pneumonia concerning for aspiration pneumonia.  Recently hospitalized for acute GI bleed due to diverticulosis and treated for pneumonia and discharged home.  Evaluated by SLP who recommended NPO.  IR consulted for G-tube placement after discussion with patient and family..  Palliative consulted as well.  CODE STATUS changed to DNR on 12/26.  G-tube placed on 12/29.  Tube feed initiated.  Dietitian titrating.  Final disposition SNF.  Subjective: Seen and examined this morning.  No major events overnight or this morning.  He reports shortness of breath.  Improved some with breathing treatments.   Assessment and plan: Bilateral multifocal pneumonia: Concerning for aspiration pneumonia and patient with chronic dysphagia.  CT chest showed bilateral infiltrate.  Pro-Cal 0.35.  Completed antibiotic course with IV Unasyn  on 12/28. -Vancomycin  12/21-12/22.  Zithromax  12/22. -Cefepime  and Flagyl  12/21>> Unasyn  12/24-12/28. -Change inhalers to nebulizers, scheduled and as needed. -Continue IS, mucolytics -SLP recommended NPO.  G-tube placed on 12/29.  Tube feeds started. -Discontinue IV fluid.  IV Lasix  20 mg x 1 -Continue aspiration precaution   Acute on chronic anemia: Recently hospitalized and transfused about 5 units. EGD and colonoscopy last week.  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  Hgb dropped to 6.3 on 12/23.  Transfused 1 unit  on 12/23 and another unit on 12/26. Recent Labs    02/20/24 2316 02/21/24 0709 02/22/24 0732 02/23/24 0810 02/24/24 0357 02/25/24 0343 02/26/24 0429 02/27/24 0335 02/28/24 0336 02/29/24 0331  HGB 8.8*  8.5* 7.3* 6.3* 8.1* 7.5* 7.6* 9.1* 9.3* 8.6* 8.5*  -Continue monitoring   Acute on chronic HFrEF: Reports shortness of breath this morning.  Has 1+ BLE edema which seems to be stable.  Rhonchorous on exam.  He was on gentle IV fluid while n.p.o. proBNP elevated.  -Discontinue IV fluid -IV Lasix  20 mg x 1 -Follow chest x-ray. -Strict intake and output, daily weight, renal functions and electrolytes   Oropharyngeal dysphagia -SLP recommended NPO. - G-tube placed by IR on 12/29.  Tube feed initiated   Chronic A-fib: Rate controlled.  Not on anticoagulation due to GI bleed.   Debility and physical deconditioning: At baseline, ambulates with rolling walker.  Also has a stair lift. - PT/OT-recommended SNF.  Anxiety: Reports taking Xanax  1 mg 3 times daily as needed at home. - Continue Xanax  0.5 mg 3 times daily as needed  Advance care planning: CODE STATUS changed to DNR/DNI after discussion on 12/26 - Palliative medicine on board  Moderate malnutrition Body mass index is 22.81 kg/m. Nutrition Problem: Moderate Malnutrition Etiology: chronic illness, dysphagia Signs/Symptoms: mild fat depletion, mild muscle depletion, energy intake < 75% for > 7 days, percent weight loss Interventions: Tube feeding, Refer to RD note for recommendations   DVT prophylaxis:  Place and maintain sequential compression device Start: 02/21/24 0445  Code Status: DNR Family Communication: Updated patient's son at bedside. Level of care: Med-Surg Status is: Inpatient Remains inpatient appropriate because: Aspiration pneumonia, dysphagia and  acute CHF   Final disposition: SNF   55 minutes with more than 50% spent in reviewing records, counseling patient/family and coordinating  care.  Consultants:  Palliative medicine Interventional radiology  Procedures: None  Microbiology summarized: COVID-19, influenza and RSV PCR nonreactive A-20 pathogen RVP nonreactive Blood cultures NGTD  Objective: Vitals:   02/28/24 2120 02/29/24 0210 02/29/24 0221 02/29/24 0500  BP:  (!) 103/48    Pulse:  63    Resp:  20    Temp:  98 F (36.7 C)    TempSrc:  Oral    SpO2: 96% 95% 96%   Weight:    72.1 kg  Height:        Examination:  GENERAL: No apparent distress.  Nontoxic. HEENT: MMM.  Vision and hearing grossly intact.  NECK: Supple.  No apparent JVD.  RESP:  No IWOB.  Fair aeration bilaterally.  Rhonchorous on exam. CVS:  RRR. Heart sounds normal.  ABD/GI/GU: BS+. Abd soft, NTND.  G-tube. MSK/EXT:  Moves extremities. No apparent deformity.  1+ BLE edema. SKIN: no apparent skin lesion or wound NEURO: Sleeping.  No apparent focal neuro deficit. PSYCH: Calm. Normal affect.   Sch Meds:  Scheduled Meds:  ALPRAZolam   0.5 mg Per Tube Q8H   arformoterol   15 mcg Nebulization BID   budesonide  (PULMICORT ) nebulizer solution  0.5 mg Nebulization BID   clotrimazole   10 mg Oral 5 X Daily   famotidine   20 mg Per Tube Daily   feeding supplement (OSMOLITE 1.5 CAL)  1,000 mL Per Tube Q24H   ferrous sulfate   300 mg Per Tube Daily   lidocaine   1 Application Topical Once   mexiletine  200 mg Per Tube BID   revefenacin   175 mcg Nebulization Daily   thiamine   100 mg Per Tube Daily   Continuous Infusions:   PRN Meds:.acetaminophen  **OR** acetaminophen , fentaNYL , glucagon  (human recombinant), ipratropium-albuterol , lip balm, magic mouthwash w/lidocaine , melatonin, menthol , midazolam  PF, ondansetron  **OR** ondansetron  (ZOFRAN ) IV  Antimicrobials: Anti-infectives (From admission, onward)    Start     Dose/Rate Route Frequency Ordered Stop   02/28/24 1030  ceFAZolin  (ANCEF ) IVPB 2g/100 mL premix        2 g 200 mL/hr over 30 Minutes Intravenous To Radiology 02/28/24 0932  02/28/24 1204   02/26/24 1800  Ampicillin -Sulbactam (UNASYN ) 3 g in sodium chloride  0.9 % 100 mL IVPB        3 g 200 mL/hr over 30 Minutes Intravenous Every 6 hours 02/26/24 1333 02/27/24 0106   02/23/24 1800  Ampicillin -Sulbactam (UNASYN ) 3 g in sodium chloride  0.9 % 100 mL IVPB  Status:  Discontinued        3 g 200 mL/hr over 30 Minutes Intravenous Every 6 hours 02/23/24 1402 02/26/24 1333   02/22/24 1800  ceFEPIme  (MAXIPIME ) 2 g in sodium chloride  0.9 % 100 mL IVPB  Status:  Discontinued        2 g 200 mL/hr over 30 Minutes Intravenous Every 8 hours 02/22/24 1148 02/23/24 1402   02/21/24 2200  vancomycin  (VANCOCIN ) IVPB 1000 mg/200 mL premix  Status:  Discontinued        1,000 mg 200 mL/hr over 60 Minutes Intravenous Every 24 hours 02/21/24 0458 02/22/24 1148   02/21/24 1000  metroNIDAZOLE  (FLAGYL ) IVPB 500 mg  Status:  Discontinued        500 mg 100 mL/hr over 60 Minutes Intravenous Every 12 hours 02/21/24 0445 02/23/24 1402   02/21/24 1000  ceFEPIme  (MAXIPIME ) 2 g in  sodium chloride  0.9 % 100 mL IVPB  Status:  Discontinued        2 g 200 mL/hr over 30 Minutes Intravenous Every 12 hours 02/21/24 0458 02/22/24 1148   02/21/24 0100  azithromycin  (ZITHROMAX ) 500 mg in sodium chloride  0.9 % 250 mL IVPB        500 mg 250 mL/hr over 60 Minutes Intravenous  Once 02/21/24 0053 02/21/24 0309   02/20/24 2315  ceFEPIme  (MAXIPIME ) 2 g in sodium chloride  0.9 % 100 mL IVPB        2 g 200 mL/hr over 30 Minutes Intravenous  Once 02/20/24 2303 02/20/24 2348   02/20/24 2315  metroNIDAZOLE  (FLAGYL ) IVPB 500 mg        500 mg 100 mL/hr over 60 Minutes Intravenous  Once 02/20/24 2303 02/21/24 0019   02/20/24 2315  vancomycin  (VANCOCIN ) IVPB 1000 mg/200 mL premix  Status:  Discontinued        1,000 mg 200 mL/hr over 60 Minutes Intravenous  Once 02/20/24 2303 02/20/24 2306   02/20/24 2315  vancomycin  (VANCOREADY) IVPB 1750 mg/350 mL        1,750 mg 175 mL/hr over 120 Minutes Intravenous  Once 02/20/24  2306 02/21/24 0224        I have personally reviewed the following labs and images: CBC: Recent Labs  Lab 02/25/24 0343 02/26/24 0429 02/27/24 0335 02/28/24 0336 02/29/24 0331  WBC 13.9* 13.2* 14.8* 16.6* 14.1*  NEUTROABS  --   --   --  14.2*  --   HGB 7.6* 9.1* 9.3* 8.6* 8.5*  HCT 24.6* 29.2* 29.6* 27.8* 27.1*  MCV 91.8 90.7 91.4 91.1 90.9  PLT 357 386 368 318 288   BMP &GFR Recent Labs  Lab 02/25/24 0343 02/26/24 0429 02/27/24 0335 02/28/24 0336 02/29/24 0331  NA 146* 145 147* 142 144  K 3.5 4.1 4.2 3.9 3.9  CL 113* 111 112* 110 112*  CO2 21* 24 22 22 24   GLUCOSE 106* 111* 88 96 133*  BUN 24* 16 17 17 21   CREATININE 0.69 0.73 0.68 0.66 0.72  CALCIUM  8.4* 8.6* 8.5* 8.7* 8.5*  MG 2.5* 2.3 2.1 2.2 2.4  PHOS 1.9* 2.5 2.8 2.6 2.9   Estimated Creatinine Clearance: 72.6 mL/min (by C-G formula based on SCr of 0.72 mg/dL). Liver & Pancreas: Recent Labs  Lab 02/23/24 0810 02/24/24 0357 02/25/24 0343 02/26/24 0429 02/27/24 0335 02/28/24 0336 02/29/24 0331  AST 28  --   --   --   --   --   --   ALT 18  --   --   --   --   --   --   ALKPHOS 84  --   --   --   --   --   --   BILITOT 0.6  --   --   --   --   --   --   PROT 4.8*  --   --   --   --   --   --   ALBUMIN 2.7*   < > 2.8* 2.9* 2.9* 2.7* 2.6*   < > = values in this interval not displayed.   No results for input(s): LIPASE, AMYLASE in the last 168 hours. No results for input(s): AMMONIA in the last 168 hours. Diabetic: No results for input(s): HGBA1C in the last 72 hours. Recent Labs  Lab 02/28/24 1636 02/28/24 2023 02/29/24 0408 02/29/24 0739  GLUCAP 97 95 121* 116*   Cardiac Enzymes:  No results for input(s): CKTOTAL, CKMB, CKMBINDEX, TROPONINI in the last 168 hours. Recent Labs    02/20/24 2258 02/29/24 0331  PROBNP 7,935.0* 9,680.0*   Coagulation Profile: No results for input(s): INR, PROTIME in the last 168 hours.  Thyroid  Function Tests: No results for input(s):  TSH, T4TOTAL, FREET4, T3FREE, THYROIDAB in the last 72 hours. Lipid Profile: No results for input(s): CHOL, HDL, LDLCALC, TRIG, CHOLHDL, LDLDIRECT in the last 72 hours. Anemia Panel: No results for input(s): VITAMINB12, FOLATE, FERRITIN, TIBC, IRON, RETICCTPCT in the last 72 hours. Urine analysis:    Component Value Date/Time   COLORURINE YELLOW 02/15/2024 0637   APPEARANCEUR CLEAR 02/15/2024 0637   LABSPEC 1.044 (H) 02/15/2024 0637   PHURINE 6.0 02/15/2024 0637   GLUCOSEU >=500 (A) 02/15/2024 0637   HGBUR NEGATIVE 02/15/2024 0637   BILIRUBINUR NEGATIVE 02/15/2024 0637   KETONESUR NEGATIVE 02/15/2024 0637   PROTEINUR NEGATIVE 02/15/2024 0637   NITRITE NEGATIVE 02/15/2024 0637   LEUKOCYTESUR NEGATIVE 02/15/2024 0637   Sepsis Labs: Invalid input(s): PROCALCITONIN, LACTICIDVEN  Microbiology: Recent Results (from the past 240 hours)  Resp panel by RT-PCR (RSV, Flu A&B, Covid) Anterior Nasal Swab     Status: None   Collection Time: 02/20/24 11:02 PM   Specimen: Anterior Nasal Swab  Result Value Ref Range Status   SARS Coronavirus 2 by RT PCR NEGATIVE NEGATIVE Final   Influenza A by PCR NEGATIVE NEGATIVE Final   Influenza B by PCR NEGATIVE NEGATIVE Final    Comment: (NOTE) The Xpert Xpress SARS-CoV-2/FLU/RSV plus assay is intended as an aid in the diagnosis of influenza from Nasopharyngeal swab specimens and should not be used as a sole basis for treatment. Nasal washings and aspirates are unacceptable for Xpert Xpress SARS-CoV-2/FLU/RSV testing.  Fact Sheet for Patients: bloggercourse.com  Fact Sheet for Healthcare Providers: seriousbroker.it  This test is not yet approved or cleared by the [place]  FDA and has been authorized for detection and/or diagnosis of SARS-CoV-2 by FDA under an Emergency Use Authorization (EUA). This EUA will remain in effect (meaning this test can be used)  for the duration of the COVID-19 declaration under Section 564(b)(1) of the Act, 21 U.S.C. section 360bbb-3(b)(1), unless the authorization is terminated or revoked.     Resp Syncytial Virus by PCR NEGATIVE NEGATIVE Final    Comment: (NOTE) Fact Sheet for Patients: bloggercourse.com  Fact Sheet for Healthcare Providers: seriousbroker.it  This test is not yet approved or cleared by the [place]  FDA and has been authorized for detection and/or diagnosis of SARS-CoV-2 by FDA under an Emergency Use Authorization (EUA). This EUA will remain in effect (meaning this test can be used) for the duration of the COVID-19 declaration under Section 564(b)(1) of the Act, 21 U.S.C. section 360bbb-3(b)(1), unless the authorization is terminated or revoked.  Performed at [facility], [address], [place], [place] [address]   Blood Culture (routine x 2)     Status: None   Collection Time: 02/20/24 11:02 PM   Specimen: BLOOD  Result Value Ref Range Status   Specimen Description BLOOD SITE NOT SPECIFIED  Final   Special Requests   Final    BOTTLES DRAWN AEROBIC AND ANAEROBIC Blood Culture adequate volume   Culture   Final    NO GROWTH 5 DAYS Performed at [facility], [address] [place], [place] [address]    Report Status 02/26/2024 FINAL  Final  Blood Culture (routine x 2)     Status: None  Collection Time: 02/20/24 11:07 PM   Specimen: BLOOD  Result Value Ref Range Status   Specimen Description BLOOD RIGHT ANTECUBITAL  Final   Special Requests   Final    BOTTLES DRAWN AEROBIC AND ANAEROBIC Blood Culture results may not be optimal due to an inadequate volume of blood received in culture bottles   Culture   Final    NO GROWTH 5 DAYS Performed at [facility], [address], [place], [place] [address]    Report Status 02/26/2024 FINAL  Final  Respiratory (~20 pathogens) panel by PCR     Status: None    Collection Time: 02/21/24  6:00 AM   Specimen: Nasopharyngeal Swab; Respiratory  Result Value Ref Range Status   Adenovirus NOT DETECTED NOT DETECTED Final   Coronavirus 229E NOT DETECTED NOT DETECTED Final    Comment: (NOTE) The Coronavirus on the Respiratory Panel, DOES NOT test for the novel  Coronavirus (2019 nCoV)    Coronavirus HKU1 NOT DETECTED NOT DETECTED Final   Coronavirus NL63 NOT DETECTED NOT DETECTED Final   Coronavirus OC43 NOT DETECTED NOT DETECTED Final   Metapneumovirus NOT DETECTED NOT DETECTED Final   Rhinovirus / Enterovirus NOT DETECTED NOT DETECTED Final   Influenza A NOT DETECTED NOT DETECTED Final   Influenza B NOT DETECTED NOT DETECTED Final   Parainfluenza Virus 1 NOT DETECTED NOT DETECTED Final   Parainfluenza Virus 2 NOT DETECTED NOT DETECTED Final   Parainfluenza Virus 3 NOT DETECTED NOT DETECTED Final   Parainfluenza Virus 4 NOT DETECTED NOT DETECTED Final   Respiratory Syncytial Virus NOT DETECTED NOT DETECTED Final   Bordetella pertussis NOT DETECTED NOT DETECTED Final   Bordetella Parapertussis NOT DETECTED NOT DETECTED Final   Chlamydophila pneumoniae NOT DETECTED NOT DETECTED Final   Mycoplasma pneumoniae NOT DETECTED NOT DETECTED Final    Comment: Performed at [facility], [address] [place], [place] [address]    Radiology Studies: No results found.       Hulbert Branscome T. Odus Clasby [facility]  If 7PM-7AM, please contact night-coverage [email or web address] 02/29/2024, 11:33 AM  Will "

## 2024-02-29 NOTE — Progress Notes (Addendum)
 Patient ID: William Elliott, male   DOB: Jun 25, 1940, 83 y.o.   MRN: [id number]    Referring Physician(s): Gonfa,T   Supervising Physician: Jennefer Rover  Patient Status:  [facility] - In-pt  Chief Complaint:  Dysphagia, prior stroke, aspiration pneumonia, weakness; s/p gastrostomy tube placement 02/28/24  Subjective:  Pt doing well, no complaints, no pain. Gtube already being used for tube feeds.   Allergies: Anoro ellipta  [umeclidinium-vilanterol], Sulfites, Lipitor [atorvastatin calcium ], Niaspan [niacin], Tricor [fenofibrate], and Zocor [simvastatin]  Medications: Prior to Admission medications  Medication Sig Start Date End Date Taking? Authorizing Provider  acetaminophen  (TYLENOL ) 500 MG tablet Take 1,000 mg by mouth 2 (two) times daily as needed for moderate pain (pain score 4-6), fever or headache.   Yes [provider]  ALPRAZolam  (XANAX ) 1 MG tablet Take 1 tablet (1 mg total) by mouth at bedtime as needed for anxiety. Patient taking differently: Take 1 mg by mouth every 8 (eight) hours. 11/01/16  Yes Triplett, Tammy, PA-C  Ascorbic Acid  (VITAMIN C  PO) Take 1 tablet by mouth daily with breakfast.   Yes [provider]  aspirin  EC 81 MG tablet Take 1 tablet (81 mg total) by mouth daily. 02/12/17  Yes Debera Jayson MATSU, MD  B Complex-C (SUPER B COMPLEX PO) Take 1 capsule by mouth daily with breakfast.   Yes [provider]  CHELATED ZINC  PO Take 1 tablet by mouth daily with breakfast.   Yes [provider]  empagliflozin  (JARDIANCE ) 10 MG TABS tablet Take 1 tablet (10 mg total) by mouth daily. 10/07/23  Yes Hayes Beckey CROME, NP  Ferrous Sulfate  (IRON PO) Take 1 tablet by mouth daily with breakfast.   Yes [provider]  furosemide  (LASIX ) 20 MG tablet Take 4 tablets (80 mg total) by mouth daily. Patient taking differently: Take 40 mg by mouth See admin instructions. Take 1 tablet by mouth up to four times a day 11/12/23  Yes Lee, Jordan, NP   ipratropium-albuterol  (DUONEB) 0.5-2.5 (3) MG/3ML SOLN Take 3 mLs by nebulization every 4 (four) hours as needed (wheezing, shortness of breath). 03/11/23  Yes [provider]  mexiletine (MEXITIL ) 200 MG capsule TAKE ONE CAPSULE BY MOUTH TWICE DAILY 02/04/24  Yes Marcine, Brittainy M, PA-C  Multiple Vitamins-Minerals (MENS 50+ MULTIVITAMIN) TABS Take 1 tablet by mouth daily with breakfast.   Yes [provider]  NEXLIZET 180-10 MG TABS Take 1 tablet by mouth daily. 07/23/22  Yes [provider]  nystatin (MYCOSTATIN/NYSTOP) powder Apply 1 Application topically daily as needed (for dryness). 02/02/24  Yes [provider]  pantoprazole  (PROTONIX ) 40 MG tablet TAKE 1 TABLET BY MOUTH EVERY DAY 02/09/19  Yes Debera Jayson MATSU, MD  Tiotropium Bromide -Olodaterol (STIOLTO RESPIMAT ) 2.5-2.5 MCG/ACT AERS Inhale 2 puffs into the lungs daily.   Yes [provider]  albuterol  (VENTOLIN  HFA) 108 (90 Base) MCG/ACT inhaler Inhale 2 puffs into the lungs every 6 (six) hours as needed for wheezing or shortness of breath.    [provider]  [Paused] amLODipine  (NORVASC ) 5 MG tablet Take 5 mg by mouth daily. Wait to take this until your doctor or other care provider tells you to start again. 02/02/24   [provider]  [Paused] isosorbide  mononitrate (IMDUR ) 30 MG 24 hr tablet Take 0.5 tablets (15 mg total) by mouth at bedtime. Wait to take this until your doctor or other care provider tells you to start again. 10/07/23   Hayes Beckey CROME, NP  [Paused] losartan  (COZAAR ) 25 MG  tablet Take 0.5 tablets (12.5 mg total) by mouth daily. Wait to take this until your doctor or other care provider tells you to start again. 11/12/23   Lee, Jordan, NP  nitroGLYCERIN  (NITROSTAT ) 0.4 MG SL tablet DISSOLVE 1 TABLET UNDER THE TONGUE EVERY 5 MINUTES AS NEEDED FOR CHEST PAIN. DO NOT EXCEED 3 DOSES IN 15 MIN. CALL 911 IF NO RELIEF 04/19/23   Debera Jayson MATSU, MD  Omega-3 Fatty Acids  (FISH OIL PO) Take 1 capsule by mouth daily with breakfast. Patient not taking: Reported on 02/21/2024    [provider]  potassium chloride  SA (KLOR-CON  M) 20 MEQ tablet Take 20 mEq by mouth daily. Take with Lasix  Patient not taking: Reported on 02/21/2024    [provider]  [Paused] spironolactone  (ALDACTONE ) 25 MG tablet Take 1 tablet (25 mg total) by mouth daily. Wait to take this until your doctor or other care provider tells you to start again. 10/14/23   Glena Harlene HERO, FNP     Vital Signs: BP 127/61 (BP Location: Right Arm)   Pulse 73   Temp (!) 100.5 F (38.1 C) (Oral)   Resp (!) 28   Ht 5' 10 (1.778 m)   Wt 158 lb 15.2 oz (72.1 kg)   SpO2 95%   BMI 22.81 kg/m   Physical Exam Vitals and nursing note reviewed.  Pulmonary:     Effort: Pulmonary effort is normal.  Abdominal:     Palpations: Abdomen is soft.     Tenderness: There is no abdominal tenderness.     Comments: + gtube with no overlying abnormality  Neurological:     Mental Status: He is alert.     Imaging: IR GASTROSTOMY TUBE MOD SED Result Date: 02/28/2024 INDICATION: 83 year old with dysphagia and aspiration. EXAM: PLACEMENT A PERCUTANEOUS GASTROSTOMY TUBE WITH FLUOROSCOPIC GUIDANCE Physician: Juliene SAUNDERS. Henn, MD MEDICATIONS: Ancef  2 g; Antibiotics were administered within 1 hour of the procedure. Glucagon  0.5 mg ANESTHESIA/SEDATION: Moderate (conscious) sedation was employed during this procedure. A total of Versed  1mg  and fentanyl  100 mcg was administered intravenously at the order of the provider performing the procedure. Total intra-service moderate sedation time: 29 minutes. [name] level of consciousness and vital signs were monitored continuously by radiology nurse throughout the procedure under the supervision of the provider performing the procedure. FLUOROSCOPY: Radiation Exposure Index (as provided by the fluoroscopic device): 41 mGy Kerma COMPLICATIONS: None immediate.  PROCEDURE: The procedure was explained to the patient and family. The risks and benefits of the procedure were discussed and questions were addressed. Informed consent was obtained from the patient/family. Anterior abdomen was prepped and draped in sterile fashion. Maximal barrier sterile technique was utilized including caps, mask, sterile gowns, sterile gloves, sterile drape, hand hygiene and skin antiseptic. Nasogastric tube was placed with fluoroscopy. The stomach was insufflated through the nasogastric tube. Skin was anesthetized using 1% lidocaine . Using fluoroscopic guidance, a Saf-T-Pexy T fastener was deployed within the stomach. A second Saf-T-Pexy T fastener was deployed using fluoroscopic guidance. An incision was made between the T-fasteners. Needle was directed through this incision into the stomach using fluoroscopic guidance. Wire was advanced into the stomach. 8 mm x 80 mm Athletis balloon and 18 French balloon retention gastrostomy tube were loaded on the wire. The angioplasty balloon was inflated across the stomach wall and in anterior abdominal soft tissues. Angioplasty balloon was deflated as the gastrostomy tube was advanced over the balloon into the stomach. Gastrostomy tube balloon was inflated with very dilute contrast.  Wire and angioplasty balloon were removed. Contrast injection confirmed placement within the stomach. FINDINGS: Gastrostomy tube confirmed within the stomach. IMPRESSION: 1. Successful placement of a percutaneous gastrostomy tube using fluoroscopic guidance. 2. T-fasteners can be cut and removed in 2 weeks. Electronically Signed   By: Juliene Balder M.D.   On: 02/28/2024 12:13   DG Abd Portable 1V Result Date: 02/28/2024 EXAM: 1 VIEW XRAY OF THE ABDOMEN 02/28/2024 05:24:00 AM COMPARISON: CT abdomen and pelvis 02/15/2024. CLINICAL HISTORY: Gastrostomy status (HCC) 14941 FINDINGS: LINES, TUBES AND DEVICES: Gastrostomy tube balloon not well seen. BOWEL: Nonobstructive bowel gas  pattern. The enteric contrast material is identified within the colon up to the level of the rectum. SOFT TISSUES: Sternotomy wires and surgical clips noted. Right upper quadrant surgical clips noted. BONES: No acute fracture. LUNGS: Bibasilar airspace opacities. IMPRESSION: 1. Gastrostomy tube balloon is not well visualized on this exam; consider further evaluation if there is concern for malposition. Electronically signed by: Waddell Calk MD 02/28/2024 06:58 AM EST RP Workstation: [id number]   DG Chest Port 1 View Result Date: 02/26/2024 EXAM: 1 VIEW(S) XRAY OF THE CHEST 02/26/2024 01:02:00 AM COMPARISON: 02/20/2024 CLINICAL HISTORY: SOB (shortness of breath) FINDINGS: LUNGS AND PLEURA: Mild diffuse interstitial pulmonary edema persists, stable since prior examination. New retrocardiac opacification has developed in keeping with progressive atelectasis, infiltrate, or posterolateral pleural fluid. No pneumothorax. Trace right pleural effusion. HEART AND MEDIASTINUM: Stable cardiomegaly. Status post coronary artery bypass grafting. BONES AND SOFT TISSUES: No acute osseous abnormality. IMPRESSION: 1. Mild diffuse interstitial pulmonary edema, stable. 2. New retrocardiac opacification, which may represent progressive atelectasis, infiltrate, or posteriorly layering pleural fluid. 3. Trace right pleural effusion. 4. Stable cardiomegaly Electronically signed by: Dorethia Molt MD 02/26/2024 01:28 AM EST RP Workstation: [id number]    Labs:  CBC: Recent Labs    02/26/24 0429 02/27/24 0335 02/28/24 0336 02/29/24 0331  WBC 13.2* 14.8* 16.6* 14.1*  HGB 9.1* 9.3* 8.6* 8.5*  HCT 29.2* 29.6* 27.8* 27.1*  PLT 386 368 318 288    COAGS: Recent Labs    02/15/24 0155 02/16/24 0234 02/20/24 2258  INR 1.3* 1.4* 1.2    BMP: Recent Labs    02/26/24 0429 02/27/24 0335 02/28/24 0336 02/29/24 0331  NA 145 147* 142 144  K 4.1 4.2 3.9 3.9  CL 111 112* 110 112*  CO2 24 22 22 24   GLUCOSE 111* 88 96  133*  BUN 16 17 17 21   CALCIUM  8.6* 8.5* 8.7* 8.5*  CREATININE 0.73 0.68 0.66 0.72  GFRNONAA >60 >60 >60 >60    LIVER FUNCTION TESTS: Recent Labs    02/14/24 2348 02/20/24 2258 02/21/24 0709 02/23/24 0810 02/24/24 0357 02/26/24 0429 02/27/24 0335 02/28/24 0336 02/29/24 0331  BILITOT 1.3* 0.7 0.5 0.6  --   --   --   --   --   AST 35 33 35 28  --   --   --   --   --   ALT 15 24 25 18   --   --   --   --   --   ALKPHOS 56 85 80 84  --   --   --   --   --   PROT 5.3* 5.7* 5.2* 4.8*  --   --   --   --   --   ALBUMIN 2.8* 3.2* 2.9* 2.7*   < > 2.9* 2.9* 2.7* 2.6*   < > = values in this interval not displayed.    Assessment  and Plan:  S/p Gtube placement 02/28/24 - gtube and site appear well. Already being used for feeds - pt with no complaints - pt with 2 t-tacs - outpatient order already in place for follow up for removal if discharged. If patient is still inpatient, please reach out to IR and place consult order for t tac removal - no future intervention planned by IR aside from t tac removal  Electronically Signed: Kimble VEAR Clas, PA-C 02/29/2024, 3:24 PM   I spent a total of 15 Minutes at the the patient's bedside AND on the patient's hospital floor or unit, greater than 50% of which was counseling/coordinating care for gastrostomy tube follow up

## 2024-02-29 NOTE — Progress Notes (Signed)
 MEWS Progress Note  Patient Details Name: JENNINGS CORADO MRN: [id number] DOB: November 20, 1940 Today's Date: 02/29/2024   MEWS Flowsheet Documentation:  Assess: MEWS Score Temp: (!) 100.5 F (38.1 C) BP: 127/61 MAP (mmHg): 78 Pulse Rate: 73 ECG Heart Rate: 66 Resp: (!) 28 Level of Consciousness: Alert SpO2: 95 % O2 Device: Room Air Patient Activity (if Appropriate): In bed Assess: MEWS Score MEWS Temp: 1 MEWS Systolic: 0 MEWS Pulse: 0 MEWS RR: 2 MEWS LOC: 0 MEWS Score: 3 MEWS Score Color: Yellow Assess: SIRS CRITERIA SIRS Temperature : 0 SIRS Respirations : 1 SIRS Pulse: 0 SIRS WBC: 0 SIRS Score Sum : 1 Assess: if the MEWS score is Yellow or Red Were vital signs accurate and taken at a resting state?: Yes Does the patient meet 2 or more of the SIRS criteria?: Yes Does the patient have a confirmed or suspected source of infection?: No MEWS guidelines implemented : Yes, yellow Treat MEWS Interventions: Considered administering scheduled or prn medications/treatments as ordered Take Vital Signs Increase Vital Sign Frequency : Yellow: Q2hr x1, continue Q4hrs until patient remains green for 12hrs Escalate MEWS: Escalate: Yellow: Discuss with charge nurse and consider notifying provider and/or RRT        [name] 02/29/2024, 5:18 PM

## 2024-02-29 NOTE — Progress Notes (Signed)
 Nutrition Follow-up  DOCUMENTATION CODES:   Non-severe (moderate) malnutrition in context of chronic illness  INTERVENTION:   Monitor magnesium , potassium, and phosphorus daily for at least 3 days, MD to replete as needed, as pt is at risk for refeeding syndrome.  -Recommend Thiamine  100 mg daily for 7 days   Start transition to bolus-gravity feeds: -Provide 1/2 carton (120 ml) Osmolite 1.5 QID via PEG -Flush with 60 ml free water before and after each feeding  Advance slowly towards goal of:  -5 cartons of Osmolite 1.5 daily -Providing 1775 kcals, 74g protein and 905 ml H2O -Free water recommendations: 120 ml TID  NUTRITION DIAGNOSIS:   Moderate Malnutrition related to chronic illness, dysphagia as evidenced by mild fat depletion, mild muscle depletion, energy intake < 75% for > 7 days, percent weight loss.  Ongoing.  GOAL:   Patient will meet greater than or equal to 90% of their needs  Progressing.  MONITOR:   TF tolerance, Labs  REASON FOR ASSESSMENT:   Consult Enteral/tube feeding initiation and management  ASSESSMENT:   83 year old M with PMH of CAD/CABG, CVA with dysphagia and dysarthria, systolic CHF, chronic A-fib, HTN, HLD, diverticulosis and GI bleed returning with generalized weakness, shortness of breath and low-grade fever, and admitted with multifocal pneumonia concerning for aspiration pneumonia.  12/21: admitted 12/29: PEG placed in IR  Patient in room, receiving breathing treatment. No family at bedside. Pt not able to give definitive history. Shrugs shoulders to questions.  Pt is tolerating tube feeds at 20 ml/hr.  Will start transition to bolus-gravity regimen. Will advance slowly, start with 1/2 cartons.   Admission weight: 157 lbs Current weight: 158 lbs  Medications: Pepcid , Ferrous sulfate , Lasix , Thiamine   Labs reviewed: CBGs: 115-121   Diet Order:   Diet Order             Diet NPO time specified Except for: Sips with Meds  Diet  effective midnight                   EDUCATION NEEDS:   Education needs have been addressed  Skin:  Skin Assessment: Reviewed RN Assessment  Last BM:  12/26  Height:   Ht Readings from Last 1 Encounters:  02/20/24 5' 10 (1.778 m)    Weight:   Wt Readings from Last 1 Encounters:  02/29/24 72.1 kg    BMI:  Body mass index is 22.81 kg/m.  Estimated Nutritional Needs:   Kcal:  1750-1950  Protein:  80-90g  Fluid:  1.9L/day   Morna Lee, MS, RD, LDN Inpatient Clinical Dietitian Contact via Secure chat

## 2024-02-29 NOTE — Progress Notes (Signed)
 MEWS Progress Note  Patient Details Name: William Elliott MRN: [id number] DOB: 01-11-1941 Today's Date: 02/29/2024   MEWS Flowsheet Documentation:  Assess: MEWS Score Temp: (!) 100.5 F (38.1 C) BP: 127/61 MAP (mmHg): 78 Pulse Rate: 73 ECG Heart Rate: 66 Resp: (!) 28 Level of Consciousness: Alert SpO2: 95 % O2 Device: Room Air Patient Activity (if Appropriate): In bed Assess: MEWS Score MEWS Temp: 1 MEWS Systolic: 0 MEWS Pulse: 0 MEWS RR: 2 MEWS LOC: 0 MEWS Score: 3 MEWS Score Color: Yellow Assess: SIRS CRITERIA SIRS Temperature : 0 SIRS Respirations : 1 SIRS Pulse: 0 SIRS WBC: 0 SIRS Score Sum : 1 Assess: if the MEWS score is Yellow or Red Were vital signs accurate and taken at a resting state?: Yes Does the patient meet 2 or more of the SIRS criteria?: Yes Does the patient have a confirmed or suspected source of infection?: No MEWS guidelines implemented : Yes, yellow Treat MEWS Interventions: Considered administering scheduled or prn medications/treatments as ordered Take Vital Signs Increase Vital Sign Frequency : Yellow: Q2hr x1, continue Q4hrs until patient remains green for 12hrs Escalate MEWS: Escalate: Yellow: Discuss with charge nurse and consider notifying provider and/or RRT        [name] 02/29/2024, 5:20 PM

## 2024-02-29 NOTE — Plan of Care (Signed)
  Problem: Clinical Measurements: Goal: Diagnostic test results will improve Outcome: Not Progressing Goal: Respiratory complications will improve Outcome: Not Progressing   Problem: Coping: Goal: Level of anxiety will decrease Outcome: Not Progressing

## 2024-02-29 NOTE — Plan of Care (Signed)
 Labored breathing tachypnea and fever to 100.5 this afternoon.  He received IV Lasix  20 mg earlier.  Blood pressure is better.  Portable chest x-ray concerning for interval pneumonia in RML and RLL.  proBNP was elevated to 9680.  He recently completed 7 days of IV Unasyn .  He is at risk for aspiration.  Will redose IV Lasix  40 mg x 1.  Will check COVID-19, RVP, UA, blood culture, respiratory culture and CT chest.  Once blood culture drawn, we will start IV Zosyn .

## 2024-03-01 LAB — URINALYSIS, W/ REFLEX TO CULTURE (INFECTION SUSPECTED)
Bacteria, UA: NONE SEEN
Bilirubin Urine: NEGATIVE
Glucose, UA: NEGATIVE mg/dL
Hgb urine dipstick: NEGATIVE
Ketones, ur: NEGATIVE mg/dL
Leukocytes,Ua: NEGATIVE
Nitrite: NEGATIVE
Protein, ur: NEGATIVE mg/dL
Specific Gravity, Urine: 1.021 (ref 1.005–1.030)
pH: 5 (ref 5.0–8.0)

## 2024-03-01 LAB — CBC
HCT: 26.3 % — ABNORMAL LOW (ref 39.0–52.0)
Hemoglobin: 8.2 g/dL — ABNORMAL LOW (ref 13.0–17.0)
MCH: 28 pg (ref 26.0–34.0)
MCHC: 31.2 g/dL (ref 30.0–36.0)
MCV: 89.8 fL (ref 80.0–100.0)
Platelets: 250 K/uL (ref 150–400)
RBC: 2.93 MIL/uL — ABNORMAL LOW (ref 4.22–5.81)
RDW: 16.1 % — ABNORMAL HIGH (ref 11.5–15.5)
WBC: 14.9 K/uL — ABNORMAL HIGH (ref 4.0–10.5)
nRBC: 0 % (ref 0.0–0.2)

## 2024-03-01 LAB — RENAL FUNCTION PANEL
Albumin: 2.4 g/dL — ABNORMAL LOW (ref 3.5–5.0)
Anion gap: 9 (ref 5–15)
BUN: 23 mg/dL (ref 8–23)
CO2: 24 mmol/L (ref 22–32)
Calcium: 8.1 mg/dL — ABNORMAL LOW (ref 8.9–10.3)
Chloride: 109 mmol/L (ref 98–111)
Creatinine, Ser: 0.72 mg/dL (ref 0.61–1.24)
GFR, Estimated: >60 mL/min
Glucose, Bld: 109 mg/dL — ABNORMAL HIGH (ref 70–99)
Phosphorus: 3 mg/dL (ref 2.5–4.6)
Potassium: 3.3 mmol/L — ABNORMAL LOW (ref 3.5–5.1)
Sodium: 142 mmol/L (ref 135–145)

## 2024-03-01 LAB — RESPIRATORY PANEL BY PCR

## 2024-03-01 LAB — GLUCOSE, CAPILLARY
Glucose-Capillary: 106 mg/dL — ABNORMAL HIGH (ref 70–99)
Glucose-Capillary: 108 mg/dL — ABNORMAL HIGH (ref 70–99)
Glucose-Capillary: 114 mg/dL — ABNORMAL HIGH (ref 70–99)
Glucose-Capillary: 131 mg/dL — ABNORMAL HIGH (ref 70–99)
Glucose-Capillary: 143 mg/dL — ABNORMAL HIGH (ref 70–99)
Glucose-Capillary: 86 mg/dL (ref 70–99)

## 2024-03-01 LAB — STREP PNEUMONIAE URINARY ANTIGEN: Strep Pneumo Urinary Antigen: NEGATIVE

## 2024-03-01 LAB — MAGNESIUM: Magnesium: 2.2 mg/dL (ref 1.7–2.4)

## 2024-03-01 NOTE — TOC Progression Note (Signed)
 Transition of Care [facility]) - Progression Note    Patient Details  Name: William Elliott MRN: [id number] Date of Birth: 18-Apr-1940  [facility] CM/SW Contact  NORMAN ASPEN, LCSW Phone Number: 03/01/2024, 1:12 PM  Clinical Narrative:     Follow up call made to pt's son, William Elliott, to touch base on dc planning issues.  Plan remains for SNF and bed had been accepted at [facility]  Unfortunately, continued medical issues have delayed discharge.  Updated facility today of +RSV and have been told that pt cannot admit until 7days post +test.  Will hold on starting insurance authorization until closer to being medically cleared for dc.                    Expected Discharge Plan and Services                                               Social Drivers of Health (SDOH) Interventions SDOH Screenings   Food Insecurity: No Food Insecurity (02/21/2024)  Housing: Low Risk (02/21/2024)  Transportation Needs: No Transportation Needs (02/21/2024)  Utilities: Not At Risk (02/21/2024)  Alcohol Screen: Low Risk (09/14/2023)  Financial Resource Strain: Low Risk (09/14/2023)  Social Connections: Moderately Integrated (02/21/2024)  Tobacco Use: Medium Risk (02/20/2024)    Readmission Risk Interventions    09/13/2023    3:37 PM 01/22/2023   11:58 AM  Readmission Risk Prevention Plan  Post Dischage Appt  Complete  Medication Screening  Complete  Transportation Screening Complete Complete  PCP or Specialist Appt within 5-7 Days Complete   Home Care Screening Complete   Medication Review (RN CM) Complete

## 2024-03-01 NOTE — Progress Notes (Signed)
 Occupational Therapy Treatment Patient Details Name: William Elliott MRN: [id number] DOB: 07/04/1940 Today's Date: 03/01/2024   History of present illness 83 yr old male admitted with anemia s/p transfusion, PNA, weakness, acute on chronic heart failure. Pt is s/p G-tube on 02-28-24, due to dysphagia. Recent discharge from hospital 12/21. PMH:  coronary artery disease, status post prior CABG, NSTEMI, GI bleed, falls, hemorrhagic shock, anemia, Afib, CHF,   OT comments  Pt was seen for functional strengthening and progression of ADL participation. He required mod assist for supine to sit, min assist for upper body dressing seated EOB, mod assist to step-pivot to the chair using a RW, and set-up assist seated in the chair for simple upper body grooming. He was noted to be with generalized weakness, compromised endurance, and deconditioning, impacting his overall ADL performance. He presented with good effort and participation. Continue OT plan of care. Patient will benefit from continued inpatient follow up therapy, <3 hours/day.       If plan is discharge home, recommend the following:  A lot of help with bathing/dressing/bathroom;Assistance with cooking/housework;Direct supervision/assist for medications management;Direct supervision/assist for financial management;Assist for transportation;Help with stairs or ramp for entrance;A lot of help with walking and/or transfers   Equipment Recommendations  Other (comment) (defer to next setting)    Recommendations for Other Services      Precautions / Restrictions Precautions Precautions: Fall Restrictions Weight Bearing Restrictions Per Provider Order: No Other Position/Activity Restrictions: g tube       Mobility Bed Mobility Overal bed mobility: Needs Assistance Bed Mobility: Supine to Sit     Supine to sit: Mod assist, Used rails          Transfers Overall transfer level: Needs assistance Equipment used: Rolling walker (2  wheels) Transfers: Sit to/from Stand, Bed to chair/wheelchair/BSC Sit to Stand: Mod assist, From elevated surface     Step pivot transfers: Mod assist           Balance     Sitting balance-Leahy Scale: Fair       Standing balance-Leahy Scale: Poor               ADL either performed or assessed with clinical judgement   ADL Overall ADL's : Needs assistance/impaired     Grooming: Set up;Sitting;Supervision/safety Grooming Details (indicate cue type and reason): Pt performed face washing seated in bedside chair.         Upper Body Dressing : Minimal assistance;Sitting Upper Body Dressing Details (indicate cue type and reason): Pt required assist to donn hospital gown around his back seated EOB.                Praxis     Communication Communication Factors Affecting Communication: Reduced clarity of speech   Cognition Arousal: Alert Behavior During Therapy: WFL for tasks assessed/performed      Attention impairment (select first level of impairment): Divided attention Executive functioning impairment (select all impairments): Problem solving OT - Cognition Comments: able to follow 1 step commands        Following commands: Intact        Cueing   Cueing Techniques: Verbal cues, Gestural cues             Pertinent Vitals/ Pain       Pain Assessment Pain Assessment: No/denies pain   Frequency  Min 2X/week        Progress Toward Goals  OT Goals(current goals can now be found in the care plan section)  Progress towards OT goals: Progressing toward goals  Acute Rehab OT Goals Patient Stated Goal: to get stronger OT Goal Formulation: With patient/family Time For Goal Achievement: 03/10/24 Potential to Achieve Goals: Good  Plan         AM-PAC OT 6 Clicks Daily Activity     Outcome Measure   Help from another person eating meals?: Total (tube feeding) Help from another person taking care of personal grooming?: A Little Help  from another person toileting, which includes using toliet, bedpan, or urinal?: A Lot Help from another person bathing (including washing, rinsing, drying)?: A Lot Help from another person to put on and taking off regular upper body clothing?: A Little Help from another person to put on and taking off regular lower body clothing?: A Lot 6 Click Score: 13    End of Session Equipment Utilized During Treatment: Gait belt;Rolling walker (2 wheels)  OT Visit Diagnosis: Unsteadiness on feet (R26.81);Other abnormalities of gait and mobility (R26.89);Muscle weakness (generalized) (M62.81);Other symptoms and signs involving cognitive function   Activity Tolerance Patient tolerated treatment well   Patient Left in chair;with call bell/phone within reach;with family/visitor present   Nurse Communication Mobility status        Time: 8299-8275 OT Time Calculation (min): 24 min  Charges: OT General Charges $OT Visit: 1 Visit OT Treatments $Self Care/Home Management : 8-22 mins $Therapeutic Activity: 8-22 mins     Delanna JINNY Lesches, OTR/L 03/01/2024, 5:59 PM

## 2024-03-01 NOTE — Care Management Important Message (Signed)
 Important Message  Patient Details IM Letter given. Name: William Elliott MRN: [id number] Date of Birth: 1940/07/17   Important Message Given:  Yes - Medicare IM     [name] 03/01/2024, 12:20 PM

## 2024-03-01 NOTE — Progress Notes (Signed)
 " PROGRESS NOTE  William Elliott FMW:[id number] DOB: 11-30-1940   PCP: [name], MD  Patient is from: Home.  Lives with wife.   DOA: 02/20/2024 LOS: 9  Chief complaints Chief Complaint  Patient presents with   Shortness of Breath     Brief Narrative / Interim history: 83 year old M with PMH of CAD/CABG, CVA with dysphagia and dysarthria, systolic CHF, chronic A-fib, HTN, HLD, diverticulosis and GI bleed returning with generalized weakness, shortness of breath and low-grade fever, and admitted with multifocal pneumonia concerning for aspiration pneumonia.  Recently hospitalized for acute GI bleed due to diverticulosis and treated for pneumonia and discharged home.  Evaluated by SLP who recommended NPO.  IR consulted for G-tube placement after discussion with patient and family..  Palliative consulted as well.  CODE STATUS changed to DNR on 12/26.  G-tube placed on 12/29.  Tube feed initiated.  Dietitian titrating.  Final disposition SNF.  Subjective: Examined while in the bed.  Patient noted to be quite hard of hearing.  Son at bedside.  Updated on new diagnosis of RSV pneumonia.  Son thinks patient would benefit from getting out of bed and walking.  Explained that would like to start with getting out of bed twice daily and monitoring O2 sats with any form of activity to determine if occult hypoxemia  Assessment and plan: Bilateral multifocal pneumonia New diagnosis of RSV Initially was concerning for aspiration pneumonia and patient with chronic dysphagia.  CT chest showed bilateral infiltrate.  Pro-Cal 0.35.  Completed antibiotic course with IV Unasyn  on 12/28. -Vancomycin  12/21-12/22.  Zithromax  12/22. -Cefepime  and Flagyl  12/21>> Unasyn  12/24-12/28. -12/30 viral panel positive for RSV.  Suspect patient has multifactorial pneumonia which may or may not have started with RSV later complicated by aspiration and development of secondary bacterial pneumonia -Respiratory culture  collected and pending; urinary strep negative - Continue nebulizers (including scheduled budesonide  for associated viral pneumonitis) as well as as needed bronchodilators -Continue IS, mucolytics -SLP recommended NPO.  G-tube placed on 12/29.  Tube feeds started. - Developed volume overload symptoms recently therefore IV fluid with discontinued and he was given Lasix  20 mg IV x 1 with excellent urine output -Continue aspiration precautions   Acute on chronic anemia:  Recently hospitalized and transfused about 5 units.  EGD and colonoscopy last week.  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   Hgb dropped to 6.3 on 12/23.  Transfused 1 unit on 12/23 and another unit on 12/26. Continue to follow hemoglobin trend Recent Labs    02/21/24 0709 02/22/24 0732 02/23/24 0810 02/24/24 0357 02/25/24 0343 02/26/24 0429 02/27/24 0335 02/28/24 0336 02/29/24 0331 03/01/24 0333  HGB 7.3* 6.3* 8.1* 7.5* 7.6* 9.1* 9.3* 8.6* 8.5* 8.2*    Acute on chronic HFrEF:  -Apparently still having some mild dyspnea at rest has  -1+ BLE edema which seems to be stable.  -Developed volume overload symptoms several days ago prompting discontinuation of maintenance fluids and initiation of IV Lasix  x 1 dose with good diuresis -Lung sounds remain wet sounding and this is likely more related to viral pneumonitis and secondary bacterial pneumonia -CT chest pending-most recent chest x-ray on 12/30 revealed interval pneumonia in the right lower and mid to upper lung zones which would be consistent with aspiration.  -There was also decreased left lower lobe atelectasis or pneumonia.  There was stable cardiomegaly with mild interstitial pulmonary edema with improved pulmonary vascular congestion -Strict intake and output, daily weight,  renal functions and electrolytes   Oropharyngeal dysphagia Protein calorie malnutrition -SLP recommended NPO. - G-tube placed by IR on 12/29.    - Bolus tube feed initiated-currently 820 cc per dose 4 times daily-goal is 5 cartons of Osmolite 1.5 daily to provide 1775 kcal, 74 g of protein and 905 cc of water -Recommended free water 120 cc 3 times daily -BMI 23   Chronic A-fib:  Rate controlled.   Not on anticoagulation due to GI bleed.   Debility and physical deconditioning:  At baseline, ambulates with rolling walker.  Also has a stair lift. -Have ordered out of bed to chair twice daily and monitoring of activity base pulse oximetry to determine if any exertional hypoxia - PT/OT-recommended SNF.  Anxiety:  Reports taking Xanax  1 mg 3 times daily as needed at home. - Continue Xanax  0.5 mg 3 times daily as needed  Advance care planning:  --CODE STATUS changed to DNR/DNI after discussion on 12/26 - Palliative medicine on board  Moderate malnutrition Body mass index is 22.81 kg/m. Nutrition Problem: Moderate Malnutrition Etiology: chronic illness, dysphagia Signs/Symptoms: mild fat depletion, mild muscle depletion, energy intake < 75% for > 7 days, percent weight loss Interventions: Tube feeding, Refer to RD note for recommendations   DVT prophylaxis:  Place and maintain sequential compression device Start: 02/21/24 0445  Code Status: DNR  Family Communication: Updated patient's son at bedside. Level of care: Med-Surg Status is: Inpatient Remains inpatient appropriate because: Aspiration pneumonia, dysphagia and acute CHF; new finding of RSV pneumonitis-cannot be placed in skilled nursing facility until 7 days after positive RSV test   Final disposition: SNF   45 minutes with more than 50% spent in reviewing records, counseling patient/family and coordinating care.  Consultants:  Palliative medicine Interventional radiology  Procedures: None  Microbiology summarized: COVID-19, influenza and RSV PCR nonreactive A-20 pathogen RVP positive for RSV Blood cultures NGTD  Objective: Vitals:   03/01/24 0614  03/01/24 0617 03/01/24 0618 03/01/24 0734  BP:    (!) 116/52  Pulse:    (!) 59  Resp:    15  Temp:    98 F (36.7 C)  TempSrc:      SpO2: 95% 95% 95% 94%  Weight:      Height:        Examination:  GENERAL: No apparent distress.  Nontoxic.  Appears chronically ill RESP:  No IWOB.  Fair aeration bilaterally.  Coarse wheezing and crackles throughout all lung fields, no increased work of breathing at rest, on room air at rest CVS:  RRR. Heart sounds normal.  ABD/GI/GU: BS+. Abd soft, NTND.  G-tube. MSK/EXT:  Moves extremities. No apparent deformity.  1+ BLE edema. SKIN: no apparent skin lesion or wound NEURO: Sleeping.  No apparent focal neuro deficit. PSYCH: Calm. Normal affect.   Sch Meds:  Scheduled Meds:  ALPRAZolam   0.5 mg Per Tube Q8H   arformoterol   15 mcg Nebulization BID   budesonide  (PULMICORT ) nebulizer solution  0.5 mg Nebulization BID   clotrimazole   10 mg Oral 5 X Daily   famotidine   20 mg Per Tube Daily   feeding supplement (OSMOLITE 1.5 CAL)  120 mL Per Tube QID   ferrous sulfate   300 mg Per Tube Daily   lidocaine   1 Application Topical Once   mexiletine  200 mg Per Tube BID   revefenacin   175 mcg Nebulization Daily   thiamine   100 mg Per Tube Daily   Continuous Infusions:  piperacillin -tazobactam (ZOSYN )  IV 3.375  g (03/01/24 0551)    PRN Meds:.acetaminophen  **OR** acetaminophen , fentaNYL , glucagon  (human recombinant), ipratropium-albuterol , lip balm, magic mouthwash w/lidocaine , melatonin, menthol , midazolam  PF, ondansetron  **OR** ondansetron  (ZOFRAN ) IV  Antimicrobials: Anti-infectives (From admission, onward)    Start     Dose/Rate Route Frequency Ordered Stop   02/29/24 1930  piperacillin -tazobactam (ZOSYN ) IVPB 3.375 g        3.375 g 12.5 mL/hr over 240 Minutes Intravenous Every 8 hours 02/29/24 1907     02/28/24 1030  ceFAZolin  (ANCEF ) IVPB 2g/100 mL premix        2 g 200 mL/hr over 30 Minutes Intravenous To Radiology 02/28/24 0932 02/28/24 1204    02/26/24 1800  Ampicillin -Sulbactam (UNASYN ) 3 g in sodium chloride  0.9 % 100 mL IVPB        3 g 200 mL/hr over 30 Minutes Intravenous Every 6 hours 02/26/24 1333 02/27/24 0106   02/23/24 1800  Ampicillin -Sulbactam (UNASYN ) 3 g in sodium chloride  0.9 % 100 mL IVPB  Status:  Discontinued        3 g 200 mL/hr over 30 Minutes Intravenous Every 6 hours 02/23/24 1402 02/26/24 1333   02/22/24 1800  ceFEPIme  (MAXIPIME ) 2 g in sodium chloride  0.9 % 100 mL IVPB  Status:  Discontinued        2 g 200 mL/hr over 30 Minutes Intravenous Every 8 hours 02/22/24 1148 02/23/24 1402   02/21/24 2200  vancomycin  (VANCOCIN ) IVPB 1000 mg/200 mL premix  Status:  Discontinued        1,000 mg 200 mL/hr over 60 Minutes Intravenous Every 24 hours 02/21/24 0458 02/22/24 1148   02/21/24 1000  metroNIDAZOLE  (FLAGYL ) IVPB 500 mg  Status:  Discontinued        500 mg 100 mL/hr over 60 Minutes Intravenous Every 12 hours 02/21/24 0445 02/23/24 1402   02/21/24 1000  ceFEPIme  (MAXIPIME ) 2 g in sodium chloride  0.9 % 100 mL IVPB  Status:  Discontinued        2 g 200 mL/hr over 30 Minutes Intravenous Every 12 hours 02/21/24 0458 02/22/24 1148   02/21/24 0100  azithromycin  (ZITHROMAX ) 500 mg in sodium chloride  0.9 % 250 mL IVPB        500 mg 250 mL/hr over 60 Minutes Intravenous  Once 02/21/24 0053 02/21/24 0309   02/20/24 2315  ceFEPIme  (MAXIPIME ) 2 g in sodium chloride  0.9 % 100 mL IVPB        2 g 200 mL/hr over 30 Minutes Intravenous  Once 02/20/24 2303 02/20/24 2348   02/20/24 2315  metroNIDAZOLE  (FLAGYL ) IVPB 500 mg        500 mg 100 mL/hr over 60 Minutes Intravenous  Once 02/20/24 2303 02/21/24 0019   02/20/24 2315  vancomycin  (VANCOCIN ) IVPB 1000 mg/200 mL premix  Status:  Discontinued        1,000 mg 200 mL/hr over 60 Minutes Intravenous  Once 02/20/24 2303 02/20/24 2306   02/20/24 2315  vancomycin  (VANCOREADY) IVPB 1750 mg/350 mL        1,750 mg 175 mL/hr over 120 Minutes Intravenous  Once 02/20/24 2306 02/21/24  0224        I have personally reviewed the following labs and images: CBC: Recent Labs  Lab 02/26/24 0429 02/27/24 0335 02/28/24 0336 02/29/24 0331 03/01/24 0333  WBC 13.2* 14.8* 16.6* 14.1* 14.9*  NEUTROABS  --   --  14.2*  --   --   HGB 9.1* 9.3* 8.6* 8.5* 8.2*  HCT 29.2* 29.6* 27.8* 27.1* 26.3*  MCV 90.7 91.4 91.1 90.9 89.8  PLT 386 368 318 288 250   BMP &GFR Recent Labs  Lab 02/26/24 0429 02/27/24 0335 02/28/24 0336 02/29/24 0331 03/01/24 0333  NA 145 147* 142 144 142  K 4.1 4.2 3.9 3.9 3.3*  CL 111 112* 110 112* 109  CO2 24 22 22 24 24   GLUCOSE 111* 88 96 133* 109*  BUN 16 17 17 21 23   CREATININE 0.73 0.68 0.66 0.72 0.72  CALCIUM  8.6* 8.5* 8.7* 8.5* 8.1*  MG 2.3 2.1 2.2 2.4 2.2  PHOS 2.5 2.8 2.6 2.9 3.0   Estimated Creatinine Clearance: 72.6 mL/min (by C-G formula based on SCr of 0.72 mg/dL). Liver & Pancreas: Recent Labs  Lab 02/23/24 0810 02/24/24 0357 02/26/24 0429 02/27/24 0335 02/28/24 0336 02/29/24 0331 03/01/24 0333  AST 28  --   --   --   --   --   --   ALT 18  --   --   --   --   --   --   ALKPHOS 84  --   --   --   --   --   --   BILITOT 0.6  --   --   --   --   --   --   PROT 4.8*  --   --   --   --   --   --   ALBUMIN 2.7*   < > 2.9* 2.9* 2.7* 2.6* 2.4*   < > = values in this interval not displayed.   No results for input(s): LIPASE, AMYLASE in the last 168 hours. No results for input(s): AMMONIA in the last 168 hours. Diabetic: No results for input(s): HGBA1C in the last 72 hours. Recent Labs  Lab 02/29/24 1649 02/29/24 2050 03/01/24 0022 03/01/24 0409 03/01/24 0731  GLUCAP 132* 167* 143* 108* 131*   Cardiac Enzymes: No results for input(s): CKTOTAL, CKMB, CKMBINDEX, TROPONINI in the last 168 hours. Recent Labs    02/20/24 2258 02/29/24 0331  PROBNP 7,935.0* 9,680.0*   Coagulation Profile: No results for input(s): INR, PROTIME in the last 168 hours.  Thyroid  Function Tests: No results for  input(s): TSH, T4TOTAL, FREET4, T3FREE, THYROIDAB in the last 72 hours. Lipid Profile: No results for input(s): CHOL, HDL, LDLCALC, TRIG, CHOLHDL, LDLDIRECT in the last 72 hours. Anemia Panel: No results for input(s): VITAMINB12, FOLATE, FERRITIN, TIBC, IRON, RETICCTPCT in the last 72 hours. Urine analysis:    Component Value Date/Time   COLORURINE YELLOW 02/15/2024 0637   APPEARANCEUR CLEAR 02/15/2024 0637   LABSPEC 1.044 (H) 02/15/2024 0637   PHURINE 6.0 02/15/2024 0637   GLUCOSEU >=500 (A) 02/15/2024 0637   HGBUR NEGATIVE 02/15/2024 0637   BILIRUBINUR NEGATIVE 02/15/2024 0637   KETONESUR NEGATIVE 02/15/2024 0637   PROTEINUR NEGATIVE 02/15/2024 0637   NITRITE NEGATIVE 02/15/2024 0637   LEUKOCYTESUR NEGATIVE 02/15/2024 0637   Sepsis Labs: Invalid input(s): PROCALCITONIN, LACTICIDVEN  Microbiology: Recent Results (from the past 240 hours)  Resp panel by RT-PCR (RSV, Flu A&B, Covid) Anterior Nasal Swab     Status: None   Collection Time: 02/20/24 11:02 PM   Specimen: Anterior Nasal Swab  Result Value Ref Range Status   SARS Coronavirus 2 by RT PCR NEGATIVE NEGATIVE Final   Influenza A by PCR NEGATIVE NEGATIVE Final   Influenza B by PCR NEGATIVE NEGATIVE Final    Comment: (NOTE) The Xpert Xpress SARS-CoV-2/FLU/RSV plus assay is intended as an aid in the diagnosis of influenza from  Nasopharyngeal swab specimens and should not be used as a sole basis for treatment. Nasal washings and aspirates are unacceptable for Xpert Xpress SARS-CoV-2/FLU/RSV testing.  Fact Sheet for Patients: bloggercourse.com  Fact Sheet for Healthcare Providers: seriousbroker.it  This test is not yet approved or cleared by the [place]  FDA and has been authorized for detection and/or diagnosis of SARS-CoV-2 by FDA under an Emergency Use Authorization (EUA). This EUA will remain in effect (meaning this test can  be used) for the duration of the COVID-19 declaration under Section 564(b)(1) of the Act, 21 U.S.C. section 360bbb-3(b)(1), unless the authorization is terminated or revoked.     Resp Syncytial Virus by PCR NEGATIVE NEGATIVE Final    Comment: (NOTE) Fact Sheet for Patients: bloggercourse.com  Fact Sheet for Healthcare Providers: seriousbroker.it  This test is not yet approved or cleared by the [place]  FDA and has been authorized for detection and/or diagnosis of SARS-CoV-2 by FDA under an Emergency Use Authorization (EUA). This EUA will remain in effect (meaning this test can be used) for the duration of the COVID-19 declaration under Section 564(b)(1) of the Act, 21 U.S.C. section 360bbb-3(b)(1), unless the authorization is terminated or revoked.  Performed at [facility], [address] [place], [place] [address]   Blood Culture (routine x 2)     Status: None   Collection Time: 02/20/24 11:02 PM   Specimen: BLOOD  Result Value Ref Range Status   Specimen Description BLOOD SITE NOT SPECIFIED  Final   Special Requests   Final    BOTTLES DRAWN AEROBIC AND ANAEROBIC Blood Culture adequate volume   Culture   Final    NO GROWTH 5 DAYS Performed at [facility], [address] [place], [place] [address]    Report Status 02/26/2024 FINAL  Final  Blood Culture (routine x 2)     Status: None   Collection Time: 02/20/24 11:07 PM   Specimen: BLOOD  Result Value Ref Range Status   Specimen Description BLOOD RIGHT ANTECUBITAL  Final   Special Requests   Final    BOTTLES DRAWN AEROBIC AND ANAEROBIC Blood Culture results may not be optimal due to an inadequate volume of blood received in culture bottles   Culture   Final    NO GROWTH 5 DAYS Performed at [facility], [address], [place], [place] [address]    Report Status 02/26/2024 FINAL  Final  Respiratory (~20 pathogens) panel by PCR     Status: None    Collection Time: 02/21/24  6:00 AM   Specimen: Nasopharyngeal Swab; Respiratory  Result Value Ref Range Status   Adenovirus NOT DETECTED NOT DETECTED Final   Coronavirus 229E NOT DETECTED NOT DETECTED Final    Comment: (NOTE) The Coronavirus on the Respiratory Panel, DOES NOT test for the novel  Coronavirus (2019 nCoV)    Coronavirus HKU1 NOT DETECTED NOT DETECTED Final   Coronavirus NL63 NOT DETECTED NOT DETECTED Final   Coronavirus OC43 NOT DETECTED NOT DETECTED Final   Metapneumovirus NOT DETECTED NOT DETECTED Final   Rhinovirus / Enterovirus NOT DETECTED NOT DETECTED Final   Influenza A NOT DETECTED NOT DETECTED Final   Influenza B NOT DETECTED NOT DETECTED Final   Parainfluenza Virus 1 NOT DETECTED NOT DETECTED Final   Parainfluenza Virus 2 NOT DETECTED NOT DETECTED Final   Parainfluenza Virus 3 NOT DETECTED NOT DETECTED Final   Parainfluenza Virus 4 NOT DETECTED NOT DETECTED Final   Respiratory Syncytial Virus NOT  DETECTED NOT DETECTED Final   Bordetella pertussis NOT DETECTED NOT DETECTED Final   Bordetella Parapertussis NOT DETECTED NOT DETECTED Final   Chlamydophila pneumoniae NOT DETECTED NOT DETECTED Final   Mycoplasma pneumoniae NOT DETECTED NOT DETECTED Final    Comment: Performed at [facility], [address], [place], [place] [address]  Culture, blood (Routine X 2) w Reflex to ID Panel     Status: None (Preliminary result)   Collection Time: 02/29/24  3:06 PM   Specimen: BLOOD LEFT HAND  Result Value Ref Range Status   Specimen Description   Final    BLOOD LEFT HAND Performed at [facility], [address], [place], [place] [address]    Special Requests   Final    BOTTLES DRAWN AEROBIC AND ANAEROBIC Blood Culture adequate volume Performed at [facility], [address], [place], [place] [address]    Culture   Final    NO GROWTH < 12 HOURS Performed at [facility], [address], [place], [place] [address]    Report  Status PENDING  Incomplete  Culture, blood (Routine X 2) w Reflex to ID Panel     Status: None (Preliminary result)   Collection Time: 02/29/24  3:10 PM   Specimen: BLOOD LEFT HAND  Result Value Ref Range Status   Specimen Description   Final    BLOOD LEFT HAND Performed at [facility], [address] [place], [place] [address]    Special Requests   Final    BOTTLES DRAWN AEROBIC AND ANAEROBIC Blood Culture adequate volume Performed at [facility], [address], [place], [place] [address]    Culture   Final    NO GROWTH < 12 HOURS Performed at [facility], [address] [place], [place] [address]    Report Status PENDING  Incomplete  Respiratory (~20 pathogens) panel by PCR     Status: Abnormal   Collection Time: 02/29/24  8:17 PM   Specimen: Nasopharyngeal Swab; Respiratory  Result Value Ref Range Status   Adenovirus NOT DETECTED NOT DETECTED Final   Coronavirus 229E NOT DETECTED NOT DETECTED Final    Comment: (NOTE) The Coronavirus on the Respiratory Panel, DOES NOT test for the novel  Coronavirus (2019 nCoV)    Coronavirus HKU1 NOT DETECTED NOT DETECTED Final   Coronavirus NL63 NOT DETECTED NOT DETECTED Final   Coronavirus OC43 NOT DETECTED NOT DETECTED Final   Metapneumovirus NOT DETECTED NOT DETECTED Final   Rhinovirus / Enterovirus NOT DETECTED NOT DETECTED Final   Influenza A NOT DETECTED NOT DETECTED Final   Influenza B NOT DETECTED NOT DETECTED Final   Parainfluenza Virus 1 NOT DETECTED NOT DETECTED Final   Parainfluenza Virus 2 NOT DETECTED NOT DETECTED Final   Parainfluenza Virus 3 NOT DETECTED NOT DETECTED Final   Parainfluenza Virus 4 NOT DETECTED NOT DETECTED Final   Respiratory Syncytial Virus DETECTED (A) NOT DETECTED Final   Bordetella pertussis NOT DETECTED NOT DETECTED Final   Bordetella Parapertussis NOT DETECTED NOT DETECTED Final   Chlamydophila pneumoniae NOT DETECTED NOT DETECTED Final   Mycoplasma pneumoniae  NOT DETECTED NOT DETECTED Final    Comment: Performed at [facility], [address], [place], [place] [address]  SARS Coronavirus 2 by RT PCR (hospital order, performed in [facility] hospital lab) *cepheid single result test* Anterior Nasal Swab     Status: None   Collection Time: 02/29/24  8:17  PM   Specimen: Anterior Nasal Swab  Result Value Ref Range Status   SARS Coronavirus 2 by RT PCR NEGATIVE NEGATIVE Final    Comment: (NOTE) SARS-CoV-2 target nucleic acids are NOT DETECTED.  The SARS-CoV-2 RNA is generally detectable in upper and lower respiratory specimens during the acute phase of infection. The lowest concentration of SARS-CoV-2 viral copies this assay can detect is 250 copies / mL. A negative result does not preclude SARS-CoV-2 infection and should not be used as the sole basis for treatment or other patient management decisions.  A negative result may occur with improper specimen collection / handling, submission of specimen other than nasopharyngeal swab, presence of viral mutation(s) within the areas targeted by this assay, and inadequate number of viral copies (<250 copies / mL). A negative result must be combined with clinical observations, patient history, and epidemiological information.  Fact Sheet for Patients:   roadlaptop.co.za  Fact Sheet for Healthcare Providers: http://kim-miller.com/  This test is not yet approved or  cleared by the [place]  FDA and has been authorized for detection and/or diagnosis of SARS-CoV-2 by FDA under an Emergency Use Authorization (EUA).  This EUA will remain in effect (meaning this test can be used) for the duration of the COVID-19 declaration under Section 564(b)(1) of the Act, 21 U.S.C. section 360bbb-3(b)(1), unless the authorization is terminated or revoked sooner.  Performed at [facility], [address], [place], [place] [address]   Culture,  Respiratory w Gram Stain     Status: None (Preliminary result)   Collection Time: 02/29/24  8:29 PM   Specimen: Expectorated Sputum; Respiratory  Result Value Ref Range Status   Specimen Description   Final    EXPECTORATED SPUTUM Performed at [facility], [address] [place], [place] [address]    Special Requests   Final    Normal Performed at [facility], [address], [place], [place] [address]    Gram Stain   Final    FEW WBC PRESENT, PREDOMINANTLY PMN FEW GRAM POSITIVE COCCI Performed at [facility], [address], [place], [place] [address]    Culture PENDING  Incomplete   Report Status PENDING  Incomplete    Radiology Studies: DG Chest Port 1 View Result Date: 02/29/2024 CLINICAL DATA:  Shortness of breath. EXAM: PORTABLE CHEST 1 VIEW COMPARISON:  02/26/2024 FINDINGS: Stable enlarged cardiac silhouette and post CABG changes. Interval patchy densities in the right lower and mid to upper lung zones. Decreased patchy density and consolidation in the left lower lobe. Decreased prominence of the pulmonary vasculature with stable diffuse prominence of the interstitial markings some Kerley lines. Minimal right and small left pleural effusions, unchanged. Thoracic spine degenerative changes. IMPRESSION: 1. Interval pneumonia in the right lower and mid to upper lung zones. 2. Decreased left lower lobe atelectasis or pneumonia. 3. Stable cardiomegaly and mild interstitial pulmonary edema with improved pulmonary vascular congestion. 4. Stable minimal right and small left pleural effusions. Electronically Signed   By: Elspeth Bathe M.D.   On: 02/29/2024 16:33        Isaiah Lever ANP [facility]  If 7PM-7AM, please contact night-coverage [email or web address] 03/01/2024, 7:52 AM  Will "

## 2024-03-01 NOTE — Progress Notes (Signed)
 Speech Language Pathology Treatment: Dysphagia  Patient Details Name: William Elliott MRN: [id number] DOB: 09-Jun-1940 Today's Date: 03/01/2024 Time: 8469-8453 SLP Time Calculation (min) (ACUTE ONLY): 16 min  Assessment / Plan / Recommendation Clinical Impression  Plan: continue NPO, meds crushed in puree or via tube, PRN ice chips, water sips  Patient seen by SLP for skilled treatment focused on dysphagia goals. His son was in the room as well. Patient was receptive to having some ice chips with SLP encouraging him to cough and try to mobilize secretions. He was able to achieve a productive cough of thick, yellow-greenish phlegm which he expectorated. His voice briefly sounded more clear but audible secretions still heard. Son mentioned that patient doesn't think the flutter valve was helping any and SLP did encourage patient to continue trying to follow all recommended therapeutic interventions. He did indicate that he felt the breathing treatment from this morning helped. Per son, patient had inquired about when he could start eating some PO's like applesauce. SLP discussed reasoning for NPO status, especially now when he is weak, having trouble clearing secretions and not able to be very mobile. SLP recommended he continue to focus on clearing secretions and to continue with ice chips PRN.  (The purpose of ice for this pt is not only comfort, but to keep pharynx healthy and moist. Pooled secretions in an NPO pt can thicken and create harmful persistent secretions that pt can't mobilize. These can even form mucus plugs. A few ice chips, given after oral care, can loosen secretions for pt to clear and maintain airway hygiene. Also can preserve some swallow function. Minimal ice is not expected to be harmful to patient even if some moisture is aspirated. Cued coughs after trials also will help airway protection.)      HPI HPI: William Elliott is an 83 yo male with recent admission 12/15-12/20 for  acute GIB complicated by ABLA with hemorrhagic shock. Pt was also dx acute on chronic CHF exacerbation and PNA. 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 Pharyngeal residue and larygneal penetration were noted across consistencies, as well as aspiration of liquids. Pt returned to the hospital on 12/21 due to acute shortness of breath and fever. Pt admitted with concern for PNA and also started on tx for thrush. MBS on 12/24 for assessment of swallow strategies; nothing found to significantly reduce aspiration risk. Patient and family spoke with SLP and attending (Dr. Kathrin) and discussions ongoing on potential PEG. PMH also includes:  CAD s/p CABG, chronic A-fib (not on anticoagulation d/t previous GIB), HTN, HLD, prior CVA (2017), COPD      SLP Plan  Continue with current plan of care        Swallow Evaluation Recommendations         Recommendations  Diet recommendations: NPO Medication Administration: Crushed with puree Postural Changes and/or Swallow Maneuvers: Seated upright 90 degrees                  Oral care BID;Staff/trained caregiver to provide oral care;Oral care prior to ice chip/H20   Intermittent Supervision/Assistance Dysphagia, oropharyngeal phase (R13.12)     Continue with current plan of care     Norleen IVAR Blase, MA, CCC-SLP Speech Therapy   03/01/2024, 4:29 PM

## 2024-03-01 NOTE — Plan of Care (Signed)
   Problem: Activity: Goal: Risk for activity intolerance will decrease Outcome: Progressing   Problem: Coping: Goal: Level of anxiety will decrease Outcome: Progressing

## 2024-03-01 NOTE — Plan of Care (Signed)
   Problem: Coping: Goal: Level of anxiety will decrease Outcome: Progressing   Problem: Pain Managment: Goal: General experience of comfort will improve and/or be controlled Outcome: Progressing   Problem: Safety: Goal: Ability to remain free from injury will improve Outcome: Progressing

## 2024-03-02 DIAGNOSIS — J188 Other pneumonia, unspecified organism: Secondary | ICD-10-CM

## 2024-03-02 LAB — GLUCOSE, CAPILLARY
Glucose-Capillary: 95 mg/dL (ref 70–99)
Glucose-Capillary: 126 mg/dL — ABNORMAL HIGH (ref 70–99)
Glucose-Capillary: 113 mg/dL — ABNORMAL HIGH (ref 70–99)
Glucose-Capillary: 109 mg/dL — ABNORMAL HIGH (ref 70–99)

## 2024-03-02 NOTE — Progress Notes (Signed)
 " [facility] Progress Note  Patient: William Elliott     FMW:[id number]  DOA: 02/20/2024   PCP: [name], MD       Brief hospital course: 84 year old M with PMH of CAD/CABG, CVA with dysphagia and dysarthria, systolic CHF, chronic A-fib, HTN, HLD, diverticulosis and GI bleed returning with generalized weakness, shortness of breath and low-grade fever, and admitted with multifocal pneumonia concerning for aspiration pneumonia.  Recently hospitalized for acute GI bleed due to diverticulosis and treated for pneumonia and discharged home.   Evaluated by SLP who recommended NPO.  IR consulted for G-tube placement after discussion with patient and family..  Palliative consulted as well.  CODE STATUS changed to DNR on 12/26.  G-tube placed on 12/29.  Subjective:  No complaints  Assessment and Plan: Principal Problem:   Multifocal pneumonia- recent RSV Has completed a course of antibiotics and is stable - SLP recommended NPO however, today, son is asking for comfort feeds- I have discussed that he can eat but will need to maintain aspiration precautions   Active Problems:   Malnutrition of moderate degree with oropharyngeal dysphagia  - due to dysphagia and poor oral intake, a G tube was placed  Acute on chronic HF rER - ECHO > EF 35-40%, RV pressure and volume overload noted, bilateral atrial dilatation - resolved with LaSix   Chronic A fib - not on AC due to GI bleed - rate controlled  Severe physical deconditioning  - uses a rolling walker at baseline - will be transitioning to SNF    Code Status: Limited: Do not attempt resuscitation (DNR) -DNR-LIMITED -Do Not Intubate/DNI  Total time on patient care: 35 min DVT prophylaxis:  Place and maintain sequential compression device Start: 02/21/24 0445     Objective:   Vitals:   03/01/24 0734 03/01/24 1216 03/01/24 2216 03/02/24 0537  BP: (!) 116/52 133/67 (!) 102/51 (!) 111/52  Pulse: (!) 59 66 68 74  Resp: 15 16 16  16   Temp: 98 F (36.7 C) 98 F (36.7 C) 100 F (37.8 C) 99.6 F (37.6 C)  TempSrc:   Oral   SpO2: 94% 96% 90% 99%  Weight:      Height:       Filed Weights   02/26/24 0700 02/28/24 0500 02/29/24 0500  Weight: 70 kg 70.4 kg 72.1 kg   Exam: General exam: Appears comfortable  HEENT: oral mucosa moist Respiratory system: Clear to auscultation.  Cardiovascular system: S1 & S2 heard  Gastrointestinal system: Abdomen soft, non-tender, nondistended. Normal bowel sounds  - PEG tube in place Extremities: No cyanosis, clubbing or edema Psychiatry:  Mood & affect appropriate.   CBC: Recent Labs  Lab 02/26/24 0429 02/27/24 0335 02/28/24 0336 02/29/24 0331 03/01/24 0333  WBC 13.2* 14.8* 16.6* 14.1* 14.9*  NEUTROABS  --   --  14.2*  --   --   HGB 9.1* 9.3* 8.6* 8.5* 8.2*  HCT 29.2* 29.6* 27.8* 27.1* 26.3*  MCV 90.7 91.4 91.1 90.9 89.8  PLT 386 368 318 288 250   Basic Metabolic Panel: Recent Labs  Lab 02/26/24 0429 02/27/24 0335 02/28/24 0336 02/29/24 0331 03/01/24 0333  NA 145 147* 142 144 142  K 4.1 4.2 3.9 3.9 3.3*  CL 111 112* 110 112* 109  CO2 24 22 22 24 24   GLUCOSE 111* 88 96 133* 109*  BUN 16 17 17 21 23   CREATININE 0.73 0.68 0.66 0.72 0.72  CALCIUM  8.6* 8.5* 8.7* 8.5* 8.1*  MG 2.3  2.1 2.2 2.4 2.2  PHOS 2.5 2.8 2.6 2.9 3.0     Scheduled Meds:  ALPRAZolam   0.5 mg Per Tube Q8H   arformoterol   15 mcg Nebulization BID   budesonide  (PULMICORT ) nebulizer solution  0.5 mg Nebulization BID   famotidine   20 mg Per Tube Daily   feeding supplement (OSMOLITE 1.5 CAL)  120 mL Per Tube QID   ferrous sulfate   300 mg Per Tube Daily   lidocaine   1 Application Topical Once   mexiletine  200 mg Per Tube BID   revefenacin   175 mcg Nebulization Daily   thiamine   100 mg Per Tube Daily    Imaging and lab data personally reviewed   Author: Dayton Sherr  03/02/2024 10:54 AM  To contact [facility]>   Check the care team in [facility] and look for the attending/consulting  TRH provider listed  Log into [email or web address] and use [facility]'s universal password   Go to> [facility]  and find provider  If you still have difficulty reaching the provider, please page the [facility] (Director on Call) for the Hospitalists listed on amion     "

## 2024-03-02 NOTE — Progress Notes (Signed)
 Patient mouth breathing. O2 sat checked and noted to be in the 80's. Patient placed on 2/L with sat's still in the 80's. Patient is maintaining 92-93% on 4/L. Respiratory notified to come and evaluate patient. Patient lying in bed with eyes closed. No signs of distress. HR in the 70's.

## 2024-03-03 DIAGNOSIS — J188 Other pneumonia, unspecified organism: Secondary | ICD-10-CM | POA: Diagnosis not present

## 2024-03-03 LAB — BASIC METABOLIC PANEL WITH GFR
Anion gap: 5 (ref 5–15)
BUN: 25 mg/dL — ABNORMAL HIGH (ref 8–23)
CO2: 30 mmol/L (ref 22–32)
Calcium: 8.5 mg/dL — ABNORMAL LOW (ref 8.9–10.3)
Chloride: 110 mmol/L (ref 98–111)
Creatinine, Ser: 0.71 mg/dL (ref 0.61–1.24)
GFR, Estimated: >60 mL/min
Glucose, Bld: 101 mg/dL — ABNORMAL HIGH (ref 70–99)
Potassium: 3.7 mmol/L (ref 3.5–5.1)
Sodium: 145 mmol/L (ref 135–145)

## 2024-03-03 LAB — GLUCOSE, CAPILLARY
Glucose-Capillary: 118 mg/dL — ABNORMAL HIGH (ref 70–99)
Glucose-Capillary: 96 mg/dL (ref 70–99)
Glucose-Capillary: 103 mg/dL — ABNORMAL HIGH (ref 70–99)
Glucose-Capillary: 125 mg/dL — ABNORMAL HIGH (ref 70–99)
Glucose-Capillary: 123 mg/dL — ABNORMAL HIGH (ref 70–99)

## 2024-03-03 LAB — LEGIONELLA PNEUMOPHILA SEROGP 1 UR AG: L. pneumophila Serogp 1 Ur Ag: NEGATIVE

## 2024-03-03 LAB — CULTURE, RESPIRATORY W GRAM STAIN
Culture: NORMAL
Special Requests: NORMAL

## 2024-03-03 MED ORDER — MAGNESIUM GLUCONATE 500 (27 MG) MG PO TABS
250.0000 mg | ORAL_TABLET | Freq: Every day | ORAL | Status: DC
  Administered 2024-03-03 – 2024-03-05 (×3): 250 mg via ORAL
  Filled 2024-03-03 (×4): qty 1

## 2024-03-03 MED ORDER — ONDANSETRON HCL 4 MG/2ML IJ SOLN: 4.0000 mg | Freq: Four times a day (QID) | INTRAMUSCULAR | Status: DC | PRN

## 2024-03-03 MED ORDER — MEXILETINE HCL 200 MG PO CAPS
200.0000 mg | ORAL_CAPSULE | Freq: Two times a day (BID) | ORAL | Status: DC
  Administered 2024-03-03 – 2024-03-06 (×7): 200 mg via ORAL
  Filled 2024-03-03 (×7): qty 1

## 2024-03-03 MED ORDER — THIAMINE MONONITRATE 100 MG PO TABS
100.0000 mg | ORAL_TABLET | Freq: Every day | ORAL | Status: DC
  Administered 2024-03-03 – 2024-03-06 (×4): 100 mg via ORAL
  Filled 2024-03-03 (×4): qty 1

## 2024-03-03 MED ORDER — ALPRAZOLAM 0.5 MG PO TABS
0.5000 mg | ORAL_TABLET | Freq: Three times a day (TID) | ORAL | Status: DC
  Administered 2024-03-03 – 2024-03-06 (×10): 0.5 mg via ORAL
  Filled 2024-03-03 (×10): qty 1

## 2024-03-03 MED ORDER — ONDANSETRON HCL 4 MG PO TABS: 4.0000 mg | ORAL_TABLET | Freq: Four times a day (QID) | ORAL | Status: DC | PRN

## 2024-03-03 MED ORDER — ACETAMINOPHEN 650 MG RE SUPP: 650.0000 mg | Freq: Four times a day (QID) | RECTAL | Status: DC | PRN

## 2024-03-03 MED ORDER — ACETAMINOPHEN 325 MG PO TABS: 650.0000 mg | ORAL_TABLET | Freq: Four times a day (QID) | ORAL | Status: DC | PRN

## 2024-03-03 MED ORDER — OSMOLITE 1.5 CAL PO LIQD
237.0000 mL | Freq: Three times a day (TID) | ORAL | Status: DC
  Administered 2024-03-03 – 2024-03-04 (×3): 237 mL
  Filled 2024-03-03 (×4): qty 237

## 2024-03-03 MED ORDER — TRAZODONE HCL 50 MG PO TABS
50.0000 mg | ORAL_TABLET | Freq: Every day | ORAL | Status: DC
  Administered 2024-03-04 – 2024-03-05 (×2): 50 mg via ORAL
  Filled 2024-03-03 (×3): qty 1

## 2024-03-03 NOTE — Plan of Care (Signed)
  Problem: Clinical Measurements: Goal: Respiratory complications will improve Outcome: Progressing Goal: Cardiovascular complication will be avoided Outcome: Progressing   Problem: Activity: Goal: Risk for activity intolerance will decrease Outcome: Progressing   Problem: Pain Managment: Goal: General experience of comfort will improve and/or be controlled Outcome: Progressing   Problem: Safety: Goal: Ability to remain free from injury will improve Outcome: Progressing   Problem: Skin Integrity: Goal: Risk for impaired skin integrity will decrease Outcome: Progressing

## 2024-03-03 NOTE — Progress Notes (Signed)
 Physical Therapy Treatment Patient Details Name: William Elliott MRN: [id number] DOB: 1940-11-18 Today's Date: 03/03/2024   History of Present Illness 84 yo male admitted with anemia s/p transfusion, Pna, weakness. Recent d/c 12/21. PMH:  coronary artery disease, status post prior CABG, NSTEMI, GI bleed, falls, hemorrhagic shock, anemia, Afib, CHF,    PT Comments  Pt asleep upon arrival, son at bedside reports this is the best the pt has slept since hospitalization but wanting therapy to wake pt and attempt therapy; son down to cafeteria remainder of session. Pt needing mod A to mobilize to bedside, increased time and effort, using bedrails with UE as able. Once seated, pt engages in seated marching x10 reps, but then reports wore out and requests to return to supine. Therapist provided RW for transfers but pt declines due to fatigue, also unable to laterally scoot up to [facility] with therapist assisting with bedpad. Max A to return to supine and therapist assisted pt up in bed with bed assist and bedpad. At EOS pt requesting breathing treatment, reports shortness of breath, on 4L entire session with SpO2 85-95%, HR 60s-70s noted with varying waveform on 2 separate machines and tele monitor, BP 126/55 - charge nurse reports to room when therapist called to secretary to further evaluate; pt's RN at nurse desk as therapist exited room so notified pt's RN when leaving the room. Pt progress limited this session; will continue to progress mobility as able.   If plan is discharge home, recommend the following: Assistance with cooking/housework;Assist for transportation;Help with stairs or ramp for entrance;A lot of help with walking and/or transfers;A lot of help with bathing/dressing/bathroom   Can travel by private vehicle     No  Equipment Recommendations  None recommended by PT    Recommendations for Other Services       Precautions / Restrictions Precautions Precautions:  Fall Precaution/Restrictions Comments: G Tube Restrictions Weight Bearing Restrictions Per Provider Order: No     Mobility  Bed Mobility Overal bed mobility: Needs Assistance Bed Mobility: Supine to Sit     Supine to sit: Mod assist Sit to supine: Max assist   General bed mobility comments: mod A with thearpist assisting trunk and BLE to mobilize to bedside, pt using bedrail as able, increased tmie and effort; max A to assist BLE and trunk back into bed and scoot up in bed with bed assist and bedpad    Transfers                   General transfer comment: pt reports wore out and declines attempt despite RW positioned infront of him    Ambulation/Gait                   Stairs             Wheelchair Mobility     Tilt Bed    Modified Rankin (Stroke Patients Only)       Balance                                            Communication Communication Communication: Impaired Factors Affecting Communication: Reduced clarity of speech (whispered speech)  Cognition Arousal: Lethargic Behavior During Therapy: [facility] for tasks assessed/performed  PT - Cognition Comments: pt reports exhausted and wore out, son at bedside reports pt sleeping the best since hospitalized prior to starting session but encouraging pt participation Following commands: Intact      Cueing    Exercises      General Comments        Pertinent Vitals/Pain Pain Assessment Pain Assessment: No/denies pain    Home Living                          Prior Function            PT Goals (current goals can now be found in the care plan section) Progress towards PT goals: Not progressing toward goals - comment (limited)    Frequency    Min 3X/week      PT Plan      Co-evaluation              AM-PAC PT 6 Clicks Mobility   Outcome Measure  Help needed turning from your back to your side  while in a flat bed without using bedrails?: A Lot Help needed moving from lying on your back to sitting on the side of a flat bed without using bedrails?: A Lot Help needed moving to and from a bed to a chair (including a wheelchair)?: A Lot Help needed standing up from a chair using your arms (e.g., wheelchair or bedside chair)?: A Lot Help needed to walk in hospital room?: Total Help needed climbing 3-5 steps with a railing? : Total 6 Click Score: 10    End of Session Equipment Utilized During Treatment: Oxygen Activity Tolerance: Patient limited by fatigue Patient left: in bed;with call bell/phone within reach;with bed alarm set Nurse Communication: Mobility status;Other (comment) (pt complains of SOB, requesting breathing treatment) PT Visit Diagnosis: Muscle weakness (generalized) (M62.81);Difficulty in walking, not elsewhere classified (R26.2);Unsteadiness on feet (R26.81);History of falling (Z91.81)     Time: 8888-8858 PT Time Calculation (min) (ACUTE ONLY): 30 min  Charges:    $Therapeutic Activity: 23-37 mins PT General Charges $$ ACUTE PT VISIT: 1 Visit                     Tori Berenis Corter PT, DPT 03/03/2024, 12:01 PM

## 2024-03-03 NOTE — Progress Notes (Signed)
 Chaplain responded to unit page request to complete advance directive paperwork. Facilitated notarization. Documents uploaded to [email or web address]. Original given to pt William Elliott and his son William Elliott. No other needs at this time.

## 2024-03-03 NOTE — Progress Notes (Addendum)
 Visual blurred vision [facility] Progress Note  Patient: William Elliott     FMW:[id number]  DOA: 02/20/2024   PCP: [name], MD       Brief hospital course: 84 year old M with PMH of CAD/CABG, CVA with dysphagia and dysarthria, systolic CHF, chronic A-fib, HTN, HLD, diverticulosis and GI bleed returning with generalized weakness, shortness of breath and low-grade fever, and admitted with multifocal pneumonia concerning for aspiration pneumonia.  Recently hospitalized for acute GI bleed due to diverticulosis and treated for pneumonia and discharged home.   Evaluated by SLP who recommended NPO.  IR consulted for G-tube placement after discussion with patient and family..  Palliative consulted as well.  CODE STATUS changed to DNR on 12/26.  G-tube placed on 12/29.  Subjective:  Patient does not give much of a history.  Son is at bedside and states that the patient often wakes up in the middle of the night.  He would like to request that he receive something to help him sleep.  He  Assessment and Plan: Principal Problem:   Multifocal pneumonia- recent RSV - Has completed a course of antibiotics and is stable - SLP recommended NPO however, son is asking for comfort feeds- I have discussed that he can eat pured with honey thick liquids but will need to maintain strict aspiration precautions   Active Problems:   Malnutrition of moderate degree with oropharyngeal dysphagia  - due to dysphagia and poor oral intake, a G tube was placed  Acute on chronic HF rER - ECHO > EF 35-40%, RV pressure and volume overload noted, bilateral atrial dilatation - resolved with Lasix   Chronic A fib - not on AC due to GI bleed - rate controlled  Severe physical deconditioning  - uses a rolling walker at baseline but now bed bound and very frail - will be transitioning to SNF once off the isolation period for RSV on 1/6 - poor prognosis- palliative care to follow as outpt  Insomnia - Will  try Trazodone     Code Status: Limited: Do not attempt resuscitation (DNR) -DNR-LIMITED -Do Not Intubate/DNI  Total time on patient care: 35 min DVT prophylaxis:  Place and maintain sequential compression device Start: 02/21/24 0445     Objective:   Vitals:   03/02/24 2019 03/02/24 2224 03/03/24 0422 03/03/24 1014  BP:  (!) 110/53 (!) 124/58   Pulse:  (!) 56 63   Resp:  16 18   Temp:  98.9 F (37.2 C) 97.7 F (36.5 C)   TempSrc:   Oral   SpO2: 98% 100% 98% 98%  Weight:      Height:       Filed Weights   02/26/24 0700 02/28/24 0500 02/29/24 0500  Weight: 70 kg 70.4 kg 72.1 kg   Exam: General exam: Appears comfortable  HEENT: oral mucosa moist Respiratory system: Clear to auscultation.  Cardiovascular system: S1 & S2 heard  Gastrointestinal system: Abdomen soft, non-tender, nondistended. Normal bowel sounds  - PEG tube in place Extremities: No cyanosis, clubbing or edema Psychiatry:  Mood & affect appropriate.   CBC: Recent Labs  Lab 02/26/24 0429 02/27/24 0335 02/28/24 0336 02/29/24 0331 03/01/24 0333  WBC 13.2* 14.8* 16.6* 14.1* 14.9*  NEUTROABS  --   --  14.2*  --   --   HGB 9.1* 9.3* 8.6* 8.5* 8.2*  HCT 29.2* 29.6* 27.8* 27.1* 26.3*  MCV 90.7 91.4 91.1 90.9 89.8  PLT 386 368 318 288 250   Basic Metabolic Panel:  Recent Labs  Lab 02/26/24 0429 02/27/24 0335 02/28/24 0336 02/29/24 0331 03/01/24 0333 03/03/24 0814  NA 145 147* 142 144 142 145  K 4.1 4.2 3.9 3.9 3.3* 3.7  CL 111 112* 110 112* 109 110  CO2 24 22 22 24 24 30   GLUCOSE 111* 88 96 133* 109* 101*  BUN 16 17 17 21 23  25*  CREATININE 0.73 0.68 0.66 0.72 0.72 0.71  CALCIUM  8.6* 8.5* 8.7* 8.5* 8.1* 8.5*  MG 2.3 2.1 2.2 2.4 2.2  --   PHOS 2.5 2.8 2.6 2.9 3.0  --      Scheduled Meds:  ALPRAZolam   0.5 mg Oral Q8H   arformoterol   15 mcg Nebulization BID   budesonide  (PULMICORT ) nebulizer solution  0.5 mg Nebulization BID   famotidine   20 mg Per Tube Daily   feeding supplement (OSMOLITE 1.5  CAL)  237 mL Per Tube TID BM   ferrous sulfate   300 mg Per Tube Daily   mexiletine  200 mg Oral BID   revefenacin   175 mcg Nebulization Daily   thiamine   100 mg Oral Daily    Imaging and lab data personally reviewed   Author: [employer]  03/03/2024 1:11 PM  To contact [facility]>   Check the care team in [facility] and look for the attending/consulting TR[facility] provider listed  Log into [email or web address] and use [facility]'s universal password   Go to> [facility]  and find provider  If you still have difficulty reaching the provider, please page the [facility] (Director on Call) for the Hospitalists listed on amion

## 2024-03-03 NOTE — TOC Progression Note (Signed)
 Transition of Care [facility]) - Progression Note    Patient Details  Name: William Elliott MRN: [id number] Date of Birth: [birth year]  Transition of Care [facility]) CM/SW Contact  NORMAN ASPEN, LCSW Phone Number: 03/03/2024, 3:48 PM  Clinical Narrative:     Met with pt and son today to review dc planning issues.  Son understands that pt's +RSV test will delay SNF dc but have confirmed with [facility] that they still plan to have bed for him.  Pt will need to be 7 days post + test which would target ready for admission on Tuesday 1/6.  Plan to submit for insurance auth morning of 1/5.                      Expected Discharge Plan and Services                                               Social Drivers of Health (SDOH) Interventions SDOH Screenings   Food Insecurity: No Food Insecurity (02/21/2024)  Housing: Low Risk (02/21/2024)  Transportation Needs: No Transportation Needs (02/21/2024)  Utilities: Not At Risk (02/21/2024)  Alcohol Screen: Low Risk (09/14/2023)  Financial Resource Strain: Low Risk (09/14/2023)  Social Connections: Moderately Integrated (02/21/2024)  Tobacco Use: Medium Risk (02/20/2024)    Readmission Risk Interventions    09/13/2023    3:37 PM 01/22/2023   11:58 AM  Readmission Risk Prevention Plan  Post Dischage Appt  Complete  Medication Screening  Complete  Transportation Screening Complete Complete  PCP or Specialist Appt within 5-7 Days Complete   Home Care Screening Complete   Medication Review (RN CM) Complete

## 2024-03-03 NOTE — Progress Notes (Signed)
 Nutrition Follow-up  DOCUMENTATION CODES:   Non-severe (moderate) malnutrition in context of chronic illness  INTERVENTION:   -Provide 1 carton (237 ml) Osmolite 1.5 TID via PEG -Flush with 60 ml free water before and after each feeding   Advance slowly towards goal of:  -5 cartons of Osmolite 1.5 daily -Providing 1775 kcals, 74g protein and 905 ml H2O -Free water recommendations: 120 ml TID  -Dysphagia 1 diet, honey thick liquids for comfort feeds  NUTRITION DIAGNOSIS:   Moderate Malnutrition related to chronic illness, dysphagia as evidenced by mild fat depletion, mild muscle depletion, energy intake < 75% for > 7 days, percent weight loss.  Ongoing.  GOAL:   Patient will meet greater than or equal to 90% of their needs  Progressing.  MONITOR:   TF tolerance, Labs  ASSESSMENT:   84 year old M with PMH of CAD/CABG, CVA with dysphagia and dysarthria, systolic CHF, chronic A-fib, HTN, HLD, diverticulosis and GI bleed returning with generalized weakness, shortness of breath and low-grade fever, and admitted with multifocal pneumonia concerning for aspiration pneumonia.  12/21: admitted 12/29: PEG placed in IR  Patient sleeping, son at bedside. Per son, pt has been wanting something to eat by mouth. MD note states pt allowed to eat but under aspiration precautions. Dysphagia 1 diet ordered. Pt tolerating tube feeds so will advance to 1 carton feeds today.   Admission weight: 157 lbs Current weight: 158 lbs  Medications: Pepcid , Ferrous sulfate , Thiamine   Labs reviewed: CBGs: 96-118   Diet Order:   Diet Order             DIET - DYS 1 Room service appropriate? Yes; Fluid consistency: Honey Thick  Diet effective now                   EDUCATION NEEDS:   Education needs have been addressed  Skin:  Skin Assessment: Reviewed RN Assessment  Last BM:  12/26  Height:   Ht Readings from Last 1 Encounters:  02/20/24 5' 10 (1.778 m)    Weight:   Wt  Readings from Last 1 Encounters:  02/29/24 72.1 kg    BMI:  Body mass index is 22.81 kg/m.  Estimated Nutritional Needs:   Kcal:  1750-1950  Protein:  80-90g  Fluid:  1.9L/day   Morna Lee, MS, RD, LDN Inpatient Clinical Dietitian Contact via Secure chat

## 2024-03-04 DIAGNOSIS — J188 Other pneumonia, unspecified organism: Secondary | ICD-10-CM | POA: Diagnosis not present

## 2024-03-04 LAB — GLUCOSE, CAPILLARY
Glucose-Capillary: 110 mg/dL — ABNORMAL HIGH (ref 70–99)
Glucose-Capillary: 95 mg/dL (ref 70–99)
Glucose-Capillary: 103 mg/dL — ABNORMAL HIGH (ref 70–99)
Glucose-Capillary: 151 mg/dL — ABNORMAL HIGH (ref 70–99)
Glucose-Capillary: 141 mg/dL — ABNORMAL HIGH (ref 70–99)
Glucose-Capillary: 121 mg/dL — ABNORMAL HIGH (ref 70–99)

## 2024-03-04 MED ORDER — OSMOLITE 1.5 CAL PO LIQD
237.0000 mL | Freq: Four times a day (QID) | ORAL | Status: DC
  Administered 2024-03-04 – 2024-03-05 (×3): 237 mL
  Filled 2024-03-04 (×5): qty 237

## 2024-03-04 MED ORDER — ALUM & MAG HYDROXIDE-SIMETH 200-200-20 MG/5ML PO SUSP
15.0000 mL | Freq: Four times a day (QID) | ORAL | Status: DC | PRN
  Administered 2024-03-04: 15 mL
  Filled 2024-03-04: qty 30

## 2024-03-04 MED ORDER — ORAL CARE MOUTH RINSE
15.0000 mL | OROMUCOSAL | Status: DC
  Administered 2024-03-04 – 2024-03-05 (×5): 15 mL via OROMUCOSAL

## 2024-03-04 MED ORDER — ORAL CARE MOUTH RINSE: 15.0000 mL | OROMUCOSAL | Status: DC | PRN

## 2024-03-04 MED ORDER — FREE WATER
120.0000 mL | Freq: Three times a day (TID) | Status: DC
  Administered 2024-03-04 – 2024-03-07 (×9): 120 mL

## 2024-03-04 NOTE — Plan of Care (Signed)
" °  Problem: Clinical Measurements: Goal: Respiratory complications will improve Outcome: Progressing Goal: Cardiovascular complication will be avoided Outcome: Progressing   Problem: Activity: Goal: Risk for activity intolerance will decrease Outcome: Progressing   Problem: Coping: Goal: Level of anxiety will decrease Outcome: Progressing   Problem: Elimination: Goal: Will not experience complications related to bowel motility Outcome: Progressing Goal: Will not experience complications related to urinary retention Outcome: Progressing   Problem: Pain Managment: Goal: General experience of comfort will improve and/or be controlled Outcome: Progressing   Problem: Safety: Goal: Ability to remain free from injury will improve Outcome: Progressing   Problem: Activity: Goal: Ability to tolerate increased activity will improve Outcome: Progressing   Problem: Respiratory: Goal: Ability to maintain adequate ventilation will improve Outcome: Progressing Goal: Ability to maintain a clear airway will improve Outcome: Progressing   "

## 2024-03-04 NOTE — Progress Notes (Addendum)
 Physical Therapy Treatment Patient Details Name: William Elliott MRN: [id number] DOB: 09-Apr-1940 Today's Date: 03/04/2024   History of Present Illness 84 yo male admitted with anemia s/p transfusion, Pna, weakness. Recent d/c 12/21. PMH:  coronary artery disease, status post prior CABG, NSTEMI, GI bleed, falls, hemorrhagic shock, anemia, Afib, CHF,    PT Comments  Pt in recliner, agreeable to therapy, son at bedside offering encouragement. Pt requiring max A to power up from recliner for multiple reps and from [facility], cues for hand placement and LE engagement, slow to rise up, slow to extend knees and upright trunk, using momentum as able. Pt amb 15 ft with RW in room, slow step to gait pattern with short bil steps and low to shuffling foot clearance, no overt LOB but generally unsteady needing min A with cues for maintaining body within frame. Pt on 2L O2 with poor waveform throughout session, reading 90-99% or note reading anything, followed pt's symptoms and seated rest provided along with pursed lip breathing cues- notified RN. Pt used BSC while in room, reliant on external support for steadying in standing, NT in room to provide pericare. Continue to recommend continued inpatient follow up therapy, <3 hours/day.   If plan is discharge home, recommend the following: Assistance with cooking/housework;Assist for transportation;Help with stairs or ramp for entrance;A lot of help with walking and/or transfers;A lot of help with bathing/dressing/bathroom   Can travel by private vehicle     No  Equipment Recommendations  None recommended by PT    Recommendations for Other Services       Precautions / Restrictions Precautions Precautions: Fall Precaution/Restrictions Comments: G Tube Restrictions Weight Bearing Restrictions Per Provider Order: No     Mobility  Bed Mobility               General bed mobility comments: in recliner upon arrival    Transfers Overall transfer level:  Needs assistance Equipment used: Rolling walker (2 wheels) Transfers: Sit to/from Stand Sit to Stand: Max assist           General transfer comment: max A to power up from recliner and BSC, cues for hand placement to push from seated surface, using rocking momentum and slow to rise with hip extension last, slow to power up and slow to extend BLE    Ambulation/Gait Ambulation/Gait assistance: Min assist Gait Distance (Feet): 15 Feet Assistive device: Rolling walker (2 wheels) Gait Pattern/deviations: Step-to pattern, Decreased step length - right, Decreased step length - left, Decreased stance time - left, Shuffle, Trunk flexed Gait velocity: decreased     General Gait Details: slow step-to short steps wtih trunk forward flexed, low bil foot clearance near shuffle step progression, cues to maintain body within RW frame and for pursed lip breathing, generally unsteady without overt LOB, therapist managing O2 tubing   Stairs             Wheelchair Mobility     Tilt Bed    Modified Rankin (Stroke Patients Only)       Balance Overall balance assessment: Needs assistance         Standing balance support: During functional activity, Reliant on assistive device for balance, Bilateral upper extremity supported Standing balance-Leahy Scale: Poor                              Communication Communication Communication: Impaired Factors Affecting Communication: Hearing impaired (wearing hearing aides)  Cognition Arousal: Alert  Behavior During Therapy: WFL for tasks assessed/performed   PT - Cognitive impairments: No apparent impairments                       PT - Cognition Comments: pt alert this date, able to make needs known, louder speech than yesterday, son at bedside offering encouragement Following commands: Intact      Cueing Cueing Techniques: Verbal cues, Gestural cues  Exercises General Exercises - Lower Extremity Hip  Flexion/Marching: AROM, Both, 10 reps (1 set standing, 1 set sitting)    General Comments General comments (skin integrity, edema, etc.): Pt on 2L O2 with SpO2 90-99%, poor waveform, HR 50s-60s at beginning of session when on tele (RN d/c tele while therapist in room); pt reports a little shortness of breath, able to make needs known and improves with seated rest and pursed lip breathing cues      Pertinent Vitals/Pain Pain Assessment Pain Assessment: No/denies pain    Home Living                          Prior Function            PT Goals (current goals can now be found in the care plan section) Progress towards PT goals: Progressing toward goals    Frequency    Min 3X/week      PT Plan      Co-evaluation              AM-PAC PT 6 Clicks Mobility   Outcome Measure  Help needed turning from your back to your side while in a flat bed without using bedrails?: A Lot Help needed moving from lying on your back to sitting on the side of a flat bed without using bedrails?: A Lot Help needed moving to and from a bed to a chair (including a wheelchair)?: A Lot Help needed standing up from a chair using your arms (e.g., wheelchair or bedside chair)?: A Lot Help needed to walk in hospital room?: A Lot Help needed climbing 3-5 steps with a railing? : Total 6 Click Score: 11    End of Session Equipment Utilized During Treatment: Oxygen;Gait belt Activity Tolerance: Patient tolerated treatment well Patient left: in chair;with call bell/phone within reach;with nursing/sitter in room (NT in room, son in hallway on phone) Nurse Communication: Mobility status;Other (comment) (SpO2 reading and poor waveform) PT Visit Diagnosis: Muscle weakness (generalized) (M62.81);Difficulty in walking, not elsewhere classified (R26.2);Unsteadiness on feet (R26.81);History of falling (Z91.81)     Time: 1220-1315 PT Time Calculation (min) (ACUTE ONLY): 55 min  Charges:     $Gait Training: 8-22 mins $Therapeutic Exercise: 8-22 mins $Therapeutic Activity: 8-22 mins PT General Charges $$ ACUTE PT VISIT: 1 Visit                     Tori Choua Ikner PT, DPT 03/04/2024, 1:28 PM

## 2024-03-04 NOTE — Progress Notes (Signed)
 Visual blurred vision [facility] Progress Note  Patient: William Elliott     FMW:[id number]  DOA: 02/20/2024   PCP: [name], MD       Brief hospital course: 84 year old M with PMH of CAD/CABG, CVA with dysphagia and dysarthria, systolic CHF, chronic A-fib, HTN, HLD, diverticulosis and GI bleed returning with generalized weakness, shortness of breath and low-grade fever, and admitted with multifocal pneumonia concerning for aspiration pneumonia.  Recently hospitalized for acute GI bleed due to diverticulosis and treated for pneumonia and discharged home.   Evaluated by SLP who recommended NPO.  IR consulted for G-tube placement after discussion with patient and family..  Palliative consulted as well.  CODE STATUS changed to DNR on 12/26.  G-tube placed on 12/29.  Subjective:  He has no complaints.   Assessment and Plan: Principal Problem:   Multifocal pneumonia- recent RSV - Has completed a course of antibiotics and is stable - SLP recommended NPO however, son asked for comfort feeds- I have discussed that he can eat pured with honey thick liquids but will need to maintain strict aspiration precautions   Active Problems:   Malnutrition of moderate degree with oropharyngeal dysphagia  - due to dysphagia and poor oral intake, a G tube was placed - Osmolite 237 TID with 60 cc free water  before and after each feed and 120 cc TID - advance to 4 cartons a day today  Acute on chronic HF rER - ECHO > EF 35-40%, RV pressure and volume overload noted, bilateral atrial dilatation - resolved with Lasix  - does not need a daily diuretic at this time  Chronic A fib - not on AC due to GI bleed - rate controlled  Severe physical deconditioning  - uses a rolling walker at baseline but now bed bound and very frail - will be transitioning to SNF once off the isolation period for RSV on 1/6 - poor prognosis- palliative care to follow as outpt  Insomnia - Will try Trazodone      Code Status: Limited: Do not attempt resuscitation (DNR) -DNR-LIMITED -Do Not Intubate/DNI  Total time on patient care: 35 min DVT prophylaxis:  Place and maintain sequential compression device Start: 02/21/24 0445     Objective:   Vitals:   03/04/24 0348 03/04/24 0544 03/04/24 0606 03/04/24 0948  BP:  (!) 121/55    Pulse:  60    Resp:  16    Temp:  98.9 F (37.2 C)    TempSrc:      SpO2: 97% 100%  98%  Weight:   68.3 kg   Height:       Filed Weights   02/28/24 0500 02/29/24 0500 03/04/24 0606  Weight: 70.4 kg 72.1 kg 68.3 kg   Exam: General exam: Appears comfortable  HEENT: oral mucosa moist Respiratory system: Clear to auscultation.  Cardiovascular system: S1 & S2 heard  Gastrointestinal system: Abdomen soft, non-tender, nondistended. Normal bowel sounds  - PEG tube in place Extremities: No cyanosis, clubbing or edema Psychiatry:  Mood & affect appropriate.   CBC: Recent Labs  Lab 02/27/24 0335 02/28/24 0336 02/29/24 0331 03/01/24 0333  WBC 14.8* 16.6* 14.1* 14.9*  NEUTROABS  --  14.2*  --   --   HGB 9.3* 8.6* 8.5* 8.2*  HCT 29.6* 27.8* 27.1* 26.3*  MCV 91.4 91.1 90.9 89.8  PLT 368 318 288 250   Basic Metabolic Panel: Recent Labs  Lab 02/27/24 0335 02/28/24 0336 02/29/24 0331 03/01/24 0333 03/03/24 0814  NA 147*  142 144 142 145  K 4.2 3.9 3.9 3.3* 3.7  CL 112* 110 112* 109 110  CO2 22 22 24 24 30   GLUCOSE 88 96 133* 109* 101*  BUN 17 17 21 23  25*  CREATININE 0.68 0.66 0.72 0.72 0.71  CALCIUM  8.5* 8.7* 8.5* 8.1* 8.5*  MG 2.1 2.2 2.4 2.2  --   PHOS 2.8 2.6 2.9 3.0  --      Scheduled Meds:  ALPRAZolam   0.5 mg Oral Q8H   arformoterol   15 mcg Nebulization BID   budesonide  (PULMICORT ) nebulizer solution  0.5 mg Nebulization BID   famotidine   20 mg Per Tube Daily   feeding supplement (OSMOLITE 1.5 CAL)  237 mL Per Tube TID BM   ferrous sulfate   300 mg Per Tube Daily   magnesium  gluconate  250 mg Oral QHS   mexiletine  200 mg Oral BID    revefenacin   175 mcg Nebulization Daily   thiamine   100 mg Oral Daily   traZODone   50 mg Oral QHS    Imaging and lab data personally reviewed   Author: Lyden Redner  03/04/2024 11:09 AM  To contact [facility]>   Check the care team in [facility] and look for the attending/consulting TR[facility] provider listed  Log into [email or web address] and use [place]'s universal password   Go to> [facility]  and find provider  If you still have difficulty reaching the provider, please page the [facility] (Director on Call) for the Hospitalists listed on amion

## 2024-03-05 DIAGNOSIS — J188 Other pneumonia, unspecified organism: Secondary | ICD-10-CM | POA: Diagnosis not present

## 2024-03-05 LAB — CULTURE, BLOOD (ROUTINE X 2)
Culture: NO GROWTH
Culture: NO GROWTH
Special Requests: ADEQUATE
Special Requests: ADEQUATE

## 2024-03-05 LAB — GLUCOSE, CAPILLARY
Glucose-Capillary: 107 mg/dL — ABNORMAL HIGH (ref 70–99)
Glucose-Capillary: 115 mg/dL — ABNORMAL HIGH (ref 70–99)
Glucose-Capillary: 129 mg/dL — ABNORMAL HIGH (ref 70–99)
Glucose-Capillary: 142 mg/dL — ABNORMAL HIGH (ref 70–99)
Glucose-Capillary: 153 mg/dL — ABNORMAL HIGH (ref 70–99)
Glucose-Capillary: 189 mg/dL — ABNORMAL HIGH (ref 70–99)
Glucose-Capillary: 92 mg/dL (ref 70–99)

## 2024-03-05 MED ORDER — OSMOLITE 1.5 CAL PO LIQD
237.0000 mL | Freq: Every day | ORAL | Status: DC
  Administered 2024-03-05 – 2024-03-07 (×10): 237 mL
  Filled 2024-03-05 (×12): qty 237

## 2024-03-05 MED ORDER — ORAL CARE MOUTH RINSE: 15.0000 mL | OROMUCOSAL | Status: DC | PRN

## 2024-03-05 MED ORDER — ORAL CARE MOUTH RINSE
15.0000 mL | OROMUCOSAL | Status: DC
  Administered 2024-03-05 (×3): 15 mL via OROMUCOSAL

## 2024-03-05 NOTE — Progress Notes (Signed)
 Notified Dr Earley patient becoming more lethargic and has strong belly breathing. His Spo2 is 98% on 2L [place] and heart rate is unchanged 68. Patients mentation is unchanged oriented x 3. Patient states his breathing feels worse now than this morning. His lung sounds are worse, wet and rhonchorous. He also has more secretions in throat. He is intermittently able to cough up tan and pink colored thick secretions. He is continuing to use flutter valve. Patient agrees to try Bipap if he is able to use medication such as ativan  to help sedate him while wearing mask. I called and notified son [name] of findings and MD recommendations of Bipap. I notified patient and son that it is possible sedation can cause respiratory arrest even with Bipap in place. The patient states he would rather stop breathing and meet God than use the Bipap without sedation. The son will be returning to the hospital this evening and states he prefers escalation of care even if the outcome is not ultimately beneficial. [place] and [name] both decline NT suctioning attempts.

## 2024-03-05 NOTE — Progress Notes (Signed)
 Mobility Specialist Progress Note:   03/05/24 1242  Mobility  Activity Pivoted/transferred from bed to chair  Level of Assistance Minimal assist, patient does 75% or more  Assistive Device Front wheel walker  Distance Ambulated (ft) 2 ft  Activity Response Tolerated well  Mobility Referral Yes  Mobility visit 1 Mobility  Mobility Specialist Start Time (ACUTE ONLY) 1145  Mobility Specialist Stop Time (ACUTE ONLY) 1202  Mobility Specialist Time Calculation (min) (ACUTE ONLY) 17 min   Pt was received in bed and agreed to mobility. Min A sit to stand. Returned to recliner with all needs met and no issues. Call bell in reach and chair alarm on.  [employer] - Mobility Specialist

## 2024-03-05 NOTE — Progress Notes (Signed)
 Patient coughing, food gargling sounds made and choking after attempting to eat breakfast. Coughed up small amount of blood. Oral care completed prior to and after eating. Patient was positioned at 90 degrees. Dr Earley notified of choking.

## 2024-03-05 NOTE — Progress Notes (Signed)
 Visual blurred vision [facility] Progress Note  Patient: William Elliott     FMW:[id number]  DOA: 02/20/2024   PCP: [name], MD       Brief hospital course: 84 year old M with PMH of CAD/CABG, CVA with dysphagia and dysarthria, systolic CHF, chronic A-fib, HTN, HLD, diverticulosis and GI bleed returning with generalized weakness, shortness of breath and low-grade fever, and admitted with multifocal pneumonia concerning for aspiration pneumonia.  Recently hospitalized for acute GI bleed due to diverticulosis and treated for pneumonia and discharged home.   Evaluated by SLP who recommended NPO.  IR consulted for G-tube placement after discussion with patient and family..  Palliative consulted as well.  CODE STATUS changed to DNR on 12/26.  G-tube placed on 12/29.  Subjective:  He has no complaints.   Assessment and Plan: Principal Problem:   Multifocal pneumonia- recent RSV - Has completed a course of antibiotics and is stable - SLP recommended NPO however, son asked for comfort feeds- I have discussed that he can eat pured with honey thick liquids but will need to maintain strict aspiration precautions   Active Problems:   Malnutrition of moderate degree with oropharyngeal dysphagia  - due to dysphagia and poor oral intake, a G tube was placed - Osmolite 237 TID with 60 cc free water  before and after each feed and 120 cc TID - advance to 5 cartons a day today  Acute on chronic HF rER - ECHO > EF 35-40%, RV pressure and volume overload noted, bilateral atrial dilatation - resolved with Lasix  - does not need a daily diuretic at this time  Chronic A fib - not on AC due to GI bleed - rate controlled  Severe physical deconditioning  - uses a rolling walker at baseline but now bed bound and very frail - will be transitioning to SNF once off the isolation period for RSV on 1/6 - poor prognosis- palliative care to follow as outpt  Insomnia - Will try Trazodone      Code Status: Limited: Do not attempt resuscitation (DNR) -DNR-LIMITED -Do Not Intubate/DNI  Total time on patient care: 35 min DVT prophylaxis:  Place and maintain sequential compression device Start: 02/21/24 0445     Objective:   Vitals:   03/04/24 1943 03/04/24 2226 03/05/24 0517 03/05/24 0938  BP:  (!) 125/56 135/66   Pulse:  62 77   Resp:  19 18   Temp:  98.9 F (37.2 C) 97.8 F (36.6 C)   TempSrc:  Oral Oral   SpO2: 95% 97% 98% 95%  Weight:      Height:       Filed Weights   02/28/24 0500 02/29/24 0500 03/04/24 0606  Weight: 70.4 kg 72.1 kg 68.3 kg   Exam: General exam: Appears comfortable  HEENT: oral mucosa moist Respiratory system: Clear to auscultation.  Cardiovascular system: S1 & S2 heard  Gastrointestinal system: Abdomen soft, non-tender, nondistended. Normal bowel sounds  - PEG tube in place Extremities: No cyanosis, clubbing or edema Psychiatry:  Mood & affect appropriate.   CBC: Recent Labs  Lab 02/28/24 0336 02/29/24 0331 03/01/24 0333  WBC 16.6* 14.1* 14.9*  NEUTROABS 14.2*  --   --   HGB 8.6* 8.5* 8.2*  HCT 27.8* 27.1* 26.3*  MCV 91.1 90.9 89.8  PLT 318 288 250   Basic Metabolic Panel: Recent Labs  Lab 02/28/24 0336 02/29/24 0331 03/01/24 0333 03/03/24 0814  NA 142 144 142 145  K 3.9 3.9 3.3* 3.7  CL 110 112* 109 110  CO2 22 24 24 30   GLUCOSE 96 133* 109* 101*  BUN 17 21 23  25*  CREATININE 0.66 0.72 0.72 0.71  CALCIUM  8.7* 8.5* 8.1* 8.5*  MG 2.2 2.4 2.2  --   PHOS 2.6 2.9 3.0  --      Scheduled Meds:  ALPRAZolam   0.5 mg Oral Q8H   arformoterol   15 mcg Nebulization BID   budesonide  (PULMICORT ) nebulizer solution  0.5 mg Nebulization BID   famotidine   20 mg Per Tube Daily   feeding supplement (OSMOLITE 1.5 CAL)  237 mL Per Tube QID   ferrous sulfate   300 mg Per Tube Daily   free water   120 mL Per Tube Q8H   magnesium  gluconate  250 mg Oral QHS   mexiletine  200 mg Oral BID   mouth rinse  15 mL Mouth Rinse 4 times per day    revefenacin   175 mcg Nebulization Daily   thiamine   100 mg Oral Daily   traZODone   50 mg Oral QHS    Imaging and lab data personally reviewed   Author: Kinlie Janice  03/05/2024 9:58 AM  To contact [facility]>   Check the care team in [facility] and look for the attending/consulting TR[facility] provider listed  Log into [email or web address] and use [place]'s universal password   Go to> [facility]  and find provider  If you still have difficulty reaching the provider, please page the [facility] (Director on Call) for the Hospitalists listed on amion

## 2024-03-05 NOTE — Plan of Care (Signed)
  Problem: Clinical Measurements: Goal: Will remain free from infection Outcome: Progressing Goal: Respiratory complications will improve Outcome: Progressing   Problem: Activity: Goal: Risk for activity intolerance will decrease Outcome: Progressing   

## 2024-03-05 NOTE — Plan of Care (Signed)
" °  Problem: Clinical Measurements: Goal: Will remain free from infection Outcome: Progressing Goal: Respiratory complications will improve Outcome: Progressing   Problem: Activity: Goal: Risk for activity intolerance will decrease 03/05/2024 0719 by Arlyss Lonell CROME, RN Outcome: Progressing 03/05/2024 0710 by Arlyss Lonell CROME, RN Outcome: Progressing   Problem: Nutrition: Goal: Adequate nutrition will be maintained Outcome: Progressing   Problem: Elimination: Goal: Will not experience complications related to bowel motility Outcome: Progressing Goal: Will not experience complications related to urinary retention Outcome: Progressing   Problem: Pain Managment: Goal: General experience of comfort will improve and/or be controlled Outcome: Progressing   Problem: Safety: Goal: Ability to remain free from injury will improve Outcome: Progressing   "

## 2024-03-05 NOTE — Progress Notes (Signed)
 Patient called me to bedside holding his chest stating he can't breath. Wet congestion heard in throat and chest. Dueoneb started on patient and called respiratory to bedside. Rhonchi in chest sounds more pronounced now than this morning. Secure chat made to Dr Earley. Orders received to admin Duoneb as needed and to use flutter valve to try to clear congestion. Patient able to cough some thick clear secretions. Patient asked if he would be willing to try NT suctioning after educating on procedure risk and benefit. He declines NT suctioning at this time.

## 2024-03-06 DIAGNOSIS — J188 Other pneumonia, unspecified organism: Secondary | ICD-10-CM | POA: Diagnosis not present

## 2024-03-06 LAB — GLUCOSE, CAPILLARY
Glucose-Capillary: 107 mg/dL — ABNORMAL HIGH (ref 70–99)
Glucose-Capillary: 110 mg/dL — ABNORMAL HIGH (ref 70–99)
Glucose-Capillary: 131 mg/dL — ABNORMAL HIGH (ref 70–99)
Glucose-Capillary: 133 mg/dL — ABNORMAL HIGH (ref 70–99)
Glucose-Capillary: 140 mg/dL — ABNORMAL HIGH (ref 70–99)
Glucose-Capillary: 143 mg/dL — ABNORMAL HIGH (ref 70–99)

## 2024-03-06 MED ORDER — ACETAMINOPHEN 650 MG RE SUPP: 650.0000 mg | Freq: Four times a day (QID) | RECTAL | Status: DC | PRN

## 2024-03-06 MED ORDER — ALPRAZOLAM 0.5 MG PO TABS: 0.5000 mg | ORAL_TABLET | Freq: Three times a day (TID) | ORAL | Status: DC | PRN

## 2024-03-06 MED ORDER — ACETAMINOPHEN 325 MG PO TABS: 650.0000 mg | ORAL_TABLET | Freq: Four times a day (QID) | ORAL | Status: DC | PRN

## 2024-03-06 MED ORDER — ALPRAZOLAM 0.5 MG PO TABS: 0.5000 mg | ORAL_TABLET | Freq: Three times a day (TID) | ORAL | Status: DC

## 2024-03-06 MED ORDER — MEXILETINE HCL 200 MG PO CAPS
200.0000 mg | ORAL_CAPSULE | Freq: Two times a day (BID) | ORAL | Status: DC
  Administered 2024-03-06 – 2024-03-07 (×2): 200 mg
  Filled 2024-03-06 (×2): qty 1

## 2024-03-06 MED ORDER — TRAZODONE HCL 50 MG PO TABS: 50.0000 mg | ORAL_TABLET | Freq: Every day | ORAL | Status: DC

## 2024-03-06 MED ORDER — ONDANSETRON HCL 4 MG PO TABS: 4.0000 mg | ORAL_TABLET | Freq: Four times a day (QID) | ORAL | Status: DC | PRN

## 2024-03-06 MED ORDER — MAGNESIUM GLUCONATE 500 (27 MG) MG PO TABS
250.0000 mg | ORAL_TABLET | Freq: Every day | ORAL | Status: DC
  Administered 2024-03-06: 250 mg
  Filled 2024-03-06: qty 1

## 2024-03-06 MED ORDER — SODIUM CHLORIDE 3 % IN NEBU
4.0000 mL | INHALATION_SOLUTION | Freq: Two times a day (BID) | RESPIRATORY_TRACT | Status: DC
  Administered 2024-03-06 – 2024-03-07 (×2): 4 mL via RESPIRATORY_TRACT
  Filled 2024-03-06 (×3): qty 4

## 2024-03-06 MED ORDER — THIAMINE MONONITRATE 100 MG PO TABS
100.0000 mg | ORAL_TABLET | Freq: Every day | ORAL | Status: DC
  Administered 2024-03-07: 100 mg
  Filled 2024-03-06: qty 1

## 2024-03-06 MED ORDER — LORAZEPAM 2 MG/ML IJ SOLN
0.5000 mg | Freq: Four times a day (QID) | INTRAMUSCULAR | Status: DC | PRN
  Administered 2024-03-07: 0.5 mg via INTRAVENOUS
  Filled 2024-03-06: qty 1

## 2024-03-06 MED ORDER — ONDANSETRON HCL 4 MG/2ML IJ SOLN: 4.0000 mg | Freq: Four times a day (QID) | INTRAMUSCULAR | Status: DC | PRN

## 2024-03-06 NOTE — TOC Progression Note (Signed)
 Transition of Care [facility]) - Progression Note    Patient Details  Name: William Elliott MRN: [id number] Date of Birth: [birth year]  [facility]) CM/SW Contact  NORMAN ASPEN, LCSW Phone Number: 03/06/2024, 4:07 PM  Clinical Narrative:     Have confirmed that SNF bed available tomorrow at [facility] and son aware will be semi-private room.  Have secured insurance authorization as well.  Recommendation made for palliative services to follow at [facility] and no agency preference for pt/family - referral placed with [facility] to follow.                    Expected Discharge Plan and Services                                               Social Drivers of Health (SDOH) Interventions SDOH Screenings   Food Insecurity: No Food Insecurity (02/21/2024)  Housing: Low Risk (02/21/2024)  Transportation Needs: No Transportation Needs (02/21/2024)  Utilities: Not At Risk (02/21/2024)  Alcohol Screen: Low Risk (09/14/2023)  Financial Resource Strain: Low Risk (09/14/2023)  Social Connections: Moderately Integrated (02/21/2024)  Tobacco Use: Medium Risk (02/20/2024)    Readmission Risk Interventions    09/13/2023    3:37 PM 01/22/2023   11:58 AM  Readmission Risk Prevention Plan  Post Dischage Appt  Complete  Medication Screening  Complete  Transportation Screening Complete Complete  PCP or Specialist Appt within 5-7 Days Complete   Home Care Screening Complete   Medication Review (RN CM) Complete

## 2024-03-06 NOTE — Progress Notes (Signed)
 Physical Therapy Treatment Patient Details Name: William Elliott MRN: [id number] DOB: 01-Sep-1940 Today's Date: 03/06/2024   History of Present Illness Pt is 84 yo male presenting with weakness, SOB, and fever.  He was admitted on 02/20/24 with multifocal PNE (recent RSV) concerning for aspiration.  Did have G-tube placed on 02/28/24. Pt does have palliative care involvement and made DNR during admission.  Pt with hx including CAD, CABG, CVA, CHF, afib, HTN, HLD, diverticulosis, and GIB.    PT Comments  Pt was able to participate with therapy but fatigues easily and needs frequent rest breaks.  He did increase gait distances some and had good participation with exercises.  Pt with shallow breaths with some accessory muscle use, but O2 sats maintained >96% during session.  Cont POC. Patient will benefit from continued inpatient follow up therapy, <3 hours/day at d/c.     If plan is discharge home, recommend the following: Assistance with cooking/housework;Assist for transportation;Help with stairs or ramp for entrance;A lot of help with walking and/or transfers;A lot of help with bathing/dressing/bathroom   Can travel by private vehicle     No  Equipment Recommendations  Wheelchair cushion (measurements PT);Wheelchair (measurements PT)    Recommendations for Other Services       Precautions / Restrictions Precautions Precautions: Fall Precaution/Restrictions Comments: G Tube     Mobility  Bed Mobility Overal bed mobility: Needs Assistance Bed Mobility: Supine to Sit, Sit to Supine     Supine to sit: Min assist Sit to supine: Mod assist   General bed mobility comments: Increased time with rails and HOB elevated    Transfers Overall transfer level: Needs assistance Equipment used: Rolling walker (2 wheels) Transfers: Sit to/from Stand Sit to Stand: Mod assist, Max assist, +2 safety/equipment   Step pivot transfers: Min assist, +2 safety/equipment       General transfer  comment: +2 for safety is helpful; Max A to stand from bed and heavy mod from chair; cues for hand placement; once standing min A for RW management and balance with pivot    Ambulation/Gait Ambulation/Gait assistance: Min assist Gait Distance (Feet): 18 Feet Assistive device: Rolling walker (2 wheels) Gait Pattern/deviations: Step-to pattern, Decreased stride length, Trunk flexed Gait velocity: decreased     General Gait Details: Min A for balance and RW management; +2 to assist with lines/safety; fatigued easily   Stairs             Wheelchair Mobility     Tilt Bed    Modified Rankin (Stroke Patients Only)       Balance Overall balance assessment: Needs assistance Sitting-balance support: No upper extremity supported, Feet supported Sitting balance-Leahy Scale: Good     Standing balance support: Bilateral upper extremity supported Standing balance-Leahy Scale: Poor Standing balance comment: Needs RW and  min A                            Communication Communication Factors Affecting Communication: Hearing impaired  Cognition Arousal: Alert Behavior During Therapy: WFL for tasks assessed/performed   PT - Cognitive impairments: No apparent impairments                       PT - Cognition Comments: Pt alert and oriented.  He doesn't speak much but is able to make his needs known and communicates during session        Cueing    Exercises Total Joint Exercises  Long Arc Quad: AROM, Both, 10 reps, Seated General Exercises - Lower Extremity Ankle Circles/Pumps: AROM, Both, 20 reps, Seated Quad Sets: AROM, Both, 5 reps, Supine Heel Slides: AROM, Both, 5 reps, Supine Hip Flexion/Marching: AROM, Both, 10 reps, Seated    General Comments General comments (skin integrity, edema, etc.): Pt on 2 L O2 with sats >96% throughout.  HR 50's-70's.  Initially pt with auidable crackles with breathing.  After doing flutter x 10 and incentive spirometer x  10 (only getting 100-250 mL with cues) and sitting up he was able to cough up thick sputum twice.  No longer with auidable crackles.  Did note accessory muscle use for breathing and pt with shallow breaths requiring rest breaks with activity.      Pertinent Vitals/Pain Pain Assessment Pain Assessment: No/denies pain    Home Living                          Prior Function            PT Goals (current goals can now be found in the care plan section) Progress towards PT goals: Progressing toward goals    Frequency    Min 2X/week      PT Plan      Co-evaluation              AM-PAC PT 6 Clicks Mobility   Outcome Measure  Help needed turning from your back to your side while in a flat bed without using bedrails?: A Lot Help needed moving from lying on your back to sitting on the side of a flat bed without using bedrails?: A Lot Help needed moving to and from a bed to a chair (including a wheelchair)?: A Lot Help needed standing up from a chair using your arms (e.g., wheelchair or bedside chair)?: A Lot Help needed to walk in hospital room?: A Lot Help needed climbing 3-5 steps with a railing? : Total 6 Click Score: 11    End of Session Equipment Utilized During Treatment: Oxygen;Gait belt Activity Tolerance: Patient tolerated treatment well (with rest breaks) Patient left: with call bell/phone within reach;in bed;with bed alarm set;with SCD's reapplied (Reports chair very uncomfortable.  Placed in chair position in bed, heels floating, pillow under L to off load) Nurse Communication: Mobility status PT Visit Diagnosis: Muscle weakness (generalized) (M62.81);Difficulty in walking, not elsewhere classified (R26.2);Unsteadiness on feet (R26.81);History of falling (Z91.81)     Time: 8499-8470 PT Time Calculation (min) (ACUTE ONLY): 29 min  Charges:    $Gait Training: 8-22 mins $Therapeutic Exercise: 8-22 mins PT General Charges $$ ACUTE PT VISIT: 1  Visit                     William, PT Acute Rehab [facility] [phone]    William Elliott 03/06/2024, 3:45 PM

## 2024-03-06 NOTE — Progress Notes (Signed)
 Visual blurred vision [facility] Progress Note  Patient: William Elliott     FMW:[id number]  DOA: 02/20/2024   PCP: [name], MD       Brief hospital course: 84 year old M with PMH of CAD/CABG, CVA with dysphagia and dysarthria, systolic CHF, chronic A-fib, HTN, HLD, diverticulosis and GI bleed returning with generalized weakness, shortness of breath and low-grade fever, and admitted with multifocal pneumonia concerning for aspiration pneumonia.  Recently hospitalized for acute GI bleed due to diverticulosis and treated for pneumonia and discharged home.   Evaluated by SLP who recommended NPO.  IR consulted for G-tube placement after discussion with patient and family..  Palliative consulted as well.  CODE STATUS changed to DNR on 12/26.  G-tube placed on 12/29. 1/4- the patient tried comfort feeds, aspirated again and developed respiratory distress. Will need to be NPO unless comfort care. OK to have mouth swabs and lollipops for short intervals. I feel this patient is nearing comfort care and at this time we can start PRN IV Ativan  for his insomnia.   The goal is still SNF with palliative care.   Subjective:  Very short of breath.   Assessment and Plan: Principal Problem:   Multifocal pneumonia- recent RSV - Has completed a course of antibiotics and is stable - SLP recommended NPO however, son asked for comfort feeds-  - aspirated again 1/4- back to NPO for now - cont Neb treatments and O2 for respiratory distress- BiPAP decllined and NT suctioning declined  Active Problems:   Malnutrition of moderate degree with oropharyngeal dysphagia  - due to dysphagia and poor oral intake, a G tube was placed - Osmolite 237 TID with 60 cc free water  before and after each feed and 120 cc TID - advanced to 5 cartons a day    Acute on chronic HF rER - ECHO > EF 35-40%, RV pressure and volume overload noted, bilateral atrial dilatation - resolved with Lasix  - does not need a daily  diuretic at this time as we are controlling his fluid intake via PEG tube  Chronic A fib - not on AC due to GI bleed - rate controlled  Severe physical deconditioning  - uses a rolling walker at baseline but now bed bound and very frail - will be transitioning to SNF once off the isolation period for RSV on 1/6 - poor prognosis- palliative care to follow as outpt  Insomnia - He has poor sleep hygiene at baseline and typically stays awake until 2 AM and sleeps through the morning - his son has repeatedly requested sedative be given to him at 2 AM when he wakes up - today we have discussed that the patient will need to get on a regular sleep schedule and will will only give PRNAtivan if he was to awaken at night     Code Status: Limited: Do not attempt resuscitation (DNR) -DNR-LIMITED -Do Not Intubate/DNI  Total time on patient care: 35 min DVT prophylaxis:  Place and maintain sequential compression device Start: 02/21/24 0445     Objective:   Vitals:   03/06/24 0443 03/06/24 0931 03/06/24 0933 03/06/24 0934  BP: (!) 152/76     Pulse: 73     Resp: 18     Temp: 98 F (36.7 C)     TempSrc: Oral     SpO2: 98% 98% 98% 98%  Weight:      Height:       Filed Weights   02/29/24 0500 03/04/24 0606  03/05/24 0720  Weight: 72.1 kg 68.3 kg 68 kg   Exam: General exam: Appears uncomfortable with tachypnea and severe fatigue HEENT: oral mucosa moist Respiratory system: tachypnea, chest congestion Cardiovascular system: S1 & S2 heard  Gastrointestinal system: Abdomen soft, non-tender, nondistended. Normal bowel sounds  - PEG tube in place Extremities: No cyanosis, clubbing or edema  CBC: Recent Labs  Lab 02/29/24 0331 03/01/24 0333  WBC 14.1* 14.9*  HGB 8.5* 8.2*  HCT 27.1* 26.3*  MCV 90.9 89.8  PLT 288 250   Basic Metabolic Panel: Recent Labs  Lab 02/29/24 0331 03/01/24 0333 03/03/24 0814  NA 144 142 145  K 3.9 3.3* 3.7  CL 112* 109 110  CO2 24 24 30   GLUCOSE 133*  109* 101*  BUN 21 23 25*  CREATININE 0.72 0.72 0.71  CALCIUM  8.5* 8.1* 8.5*  MG 2.4 2.2  --   PHOS 2.9 3.0  --      Scheduled Meds:  ALPRAZolam   0.5 mg Per Tube Q8H   arformoterol   15 mcg Nebulization BID   budesonide  (PULMICORT ) nebulizer solution  0.5 mg Nebulization BID   famotidine   20 mg Per Tube Daily   feeding supplement (OSMOLITE 1.5 CAL)  237 mL Per Tube 5 X Daily   ferrous sulfate   300 mg Per Tube Daily   free water   120 mL Per Tube Q8H   magnesium  gluconate  250 mg Per Tube QHS   mexiletine  200 mg Per Tube BID   revefenacin   175 mcg Nebulization Daily   [START ON 03/07/2024] thiamine   100 mg Per Tube Daily   traZODone   50 mg Per Tube QHS    Imaging and lab data personally reviewed   Author: [employer]  03/06/2024 1:13 PM  To contact [facility]>   Check the care team in [facility] and look for the attending/consulting [facility] provider listed  Log into [email or web address] and use [place]'s universal password   Go to> [facility]  and find provider  If you still have difficulty reaching the provider, please page the [facility] (Director on Call) for the Hospitalists listed on amion

## 2024-03-06 NOTE — Progress Notes (Signed)
 [facility] Hospital Liaison note:  Notified by care manager of patient/family request for [facility] services at [facility] after discharge.  Hospital liaison will follow patient for discharge disposition.  Please call with any hospice or outpatient palliative care related questions.  Thank you for the opportunity to participate in this patient's care.  Eleanor Nail, LPN [facility] Liaison [phone]

## 2024-03-07 DIAGNOSIS — J188 Other pneumonia, unspecified organism: Secondary | ICD-10-CM | POA: Diagnosis not present

## 2024-03-07 LAB — GLUCOSE, CAPILLARY
Glucose-Capillary: 111 mg/dL — ABNORMAL HIGH (ref 70–99)
Glucose-Capillary: 168 mg/dL — ABNORMAL HIGH (ref 70–99)
Glucose-Capillary: 163 mg/dL — ABNORMAL HIGH (ref 70–99)

## 2024-03-07 MED ORDER — MAGNESIUM GLUCONATE 500 (27 MG) MG PO TABS: 500.0000 mg | ORAL_TABLET | Freq: Every day | ORAL | 0 refills | Status: AC

## 2024-03-07 MED ORDER — FAMOTIDINE 40 MG/5ML PO SUSR: 20.0000 mg | Freq: Every day | ORAL | 0 refills | Status: AC

## 2024-03-07 MED ORDER — FERROUS SULFATE 300 (60 FE) MG/5ML PO SOLN: 300.0000 mg | Freq: Every day | ORAL | 3 refills | Status: AC

## 2024-03-07 MED ORDER — BUDESONIDE 0.5 MG/2ML IN SUSP: 0.5000 mg | Freq: Two times a day (BID) | RESPIRATORY_TRACT | 12 refills | Status: AC

## 2024-03-07 MED ORDER — ARFORMOTEROL TARTRATE 15 MCG/2ML IN NEBU: 15.0000 ug | INHALATION_SOLUTION | Freq: Two times a day (BID) | RESPIRATORY_TRACT | 0 refills | Status: AC

## 2024-03-07 MED ORDER — ACETAMINOPHEN 325 MG PO TABS: 650.0000 mg | ORAL_TABLET | Freq: Four times a day (QID) | ORAL | Status: AC | PRN

## 2024-03-07 MED ORDER — ALPRAZOLAM 0.5 MG PO TABS: 0.5000 mg | ORAL_TABLET | Freq: Three times a day (TID) | ORAL | 0 refills | Status: AC | PRN

## 2024-03-07 MED ORDER — FREE WATER: 120.0000 mL | Freq: Three times a day (TID) | Status: AC

## 2024-03-07 MED ORDER — OSMOLITE 1.5 CAL PO LIQD: 237.0000 mL | Freq: Every day | ORAL | Status: AC

## 2024-03-07 MED ORDER — MEXILETINE HCL 200 MG PO CAPS: 200.0000 mg | ORAL_CAPSULE | Freq: Two times a day (BID) | ORAL | Status: AC

## 2024-03-07 MED ORDER — POLY-VI-SOL/IRON 11 MG/ML PO SOLN: 1.0000 mL | Freq: Every day | ORAL | Status: AC

## 2024-03-07 NOTE — [place] FL2 (Addendum)
 " [name]  MEDICAID FL2 LEVEL OF CARE FORM     IDENTIFICATION  Patient Name: William Elliott Birthdate: 02-27-1941 Sex: male Admission Date (Current Location): 02/20/2024  [place] and [place] Number:  [name] [id number] Facility and Address:  [facility],  [address], [place] [address]      Provider Number:    Attending Physician Name and Address:  [name], MD  Relative Name and Phone Number:  Son, Emergency Contact  [phone] (Mobile)    Current Level of Care: SNF Recommended Level of Care: Skilled Nursing Facility Prior Approval Number:    Date Approved/Denied:   PASRR Number: [id number]  Discharge Plan: SNF    Current Diagnoses: Patient Active Problem List   Diagnosis Date Noted   Malnutrition of moderate degree 02/28/2024   Multifocal pneumonia 02/21/2024   Abnormal CT scan, stomach 02/16/2024   Diverticulosis of colon 02/16/2024   Acute bleeding 02/15/2024   Gastrointestinal hemorrhage 09/16/2023   Chronic atrial fibrillation (HCC) 09/14/2023   Acute on chronic systolic heart failure (HCC) 09/13/2023   Symptomatic anemia 09/12/2023   Hematochezia 01/20/2023   Lower GI bleed 01/19/2023   Acute GI bleeding 01/17/2023   ABLA (acute blood loss anemia) 01/17/2023   Diverticulosis 01/17/2023   Non-STEMI (non-ST elevated myocardial infarction) (HCC) 04/19/2020   PAF (paroxysmal atrial fibrillation) (HCC) 12/10/2015   Aneurysm    Transient confusion    Cerebral thrombosis with cerebral infarction 12/08/2015   Cerebral embolism with cerebral infarction 12/08/2015   Intracerebral hemorrhage 12/08/2015   Confusion 12/07/2015   NSTEMI (non-ST elevated myocardial infarction) (HCC) 03/18/2015   Coronary artery disease involving coronary bypass graft of native heart with unstable angina pectoris (HCC)    ASCVD (arteriosclerotic cardiovascular disease) 03/17/2015   Crescendo angina (HCC) 03/16/2015   Essential hypertension 07/27/2014    Dyspnea 07/26/2014   Benign hypertensive heart disease without heart failure 08/04/2013   Coronary artery disease involving native coronary artery with unstable angina pectoris (HCC)    Hyperlipidemia    Carotid bruit    Demand ischemia (HCC)    Dyslipidemia    COPD (chronic obstructive pulmonary disease) (HCC)     Orientation RESPIRATION BLADDER Height & Weight     Self, Situation, Place  O2 Continent Weight: 149 lb 14.6 oz (68 kg) Height:  5' 10 (177.8 cm)  BEHAVIORAL SYMPTOMS/MOOD NEUROLOGICAL BOWEL NUTRITION STATUS      Continent Feeding tube  AMBULATORY STATUS COMMUNICATION OF NEEDS Skin   Limited Assist Verbally Normal                       Personal Care Assistance Level of Assistance  Bathing, Feeding, Dressing Bathing Assistance: Limited assistance Feeding assistance: Limited assistance Dressing Assistance: Limited assistance     Functional Limitations Info  Sight, Hearing Sight Info: Impaired Hearing Info: Impaired      SPECIAL CARE FACTORS FREQUENCY  PT (By licensed PT), OT (By licensed OT)     PT Frequency: 5 X week OT Frequency: 5 X week            Contractures Contractures Info: Not present    Additional Factors Info  Code Status, Allergies Code Status Info: DNR -Limited Allergies Info: Anoro Ellipta  (Umeclidinium-vilanterol), Sulfites, Lipitor (Atorvastatin Calcium ), Niaspan (Niacin), Tricor (Fenofibrate), Zocor (Simvastatin)           Current Medications (03/07/2024):  This is the current hospital active medication list Current Facility-Administered Medications  Medication Dose Route Frequency Provider Last Rate Last  Admin   acetaminophen  (TYLENOL ) tablet 650 mg  650 mg Per Tube Q6H PRN Utomwen, Adesuwa, RPH       Or   acetaminophen  (TYLENOL ) suppository 650 mg  650 mg Rectal Q6H PRN Utomwen, Adesuwa, RPH       ALPRAZolam  (XANAX ) tablet 0.5 mg  0.5 mg Per Tube TID PRN Rizwan, Saima, MD       alum & mag hydroxide-simeth  (MAALOX/MYLANTA) 200-200-20 MG/5ML suspension 15 mL  15 mL Per Tube Q6H PRN Rizwan, Saima, MD   15 mL at 03/04/24 1845   arformoterol  (BROVANA ) nebulizer solution 15 mcg  15 mcg Nebulization BID Gonfa, Taye T, MD   15 mcg at 03/07/24 9051   budesonide  (PULMICORT ) nebulizer solution 0.5 mg  0.5 mg Nebulization BID Gonfa, Taye T, MD   0.5 mg at 03/07/24 0948   famotidine  (PEPCID ) 40 MG/5ML suspension 20 mg  20 mg Per Tube Daily Gonfa, Taye T, MD   20 mg at 03/07/24 1023   feeding supplement (OSMOLITE 1.5 CAL) liquid 237 mL  237 mL Per Tube 5 X Daily Rizwan, Saima, MD   237 mL at 03/07/24 1022   fentaNYL  (SUBLIMAZE ) injection   Intravenous PRN Philip Cornet, MD   50 mcg at 02/28/24 1030   ferrous sulfate  300 (60 Fe) MG/5ML syrup 300 mg  300 mg Per Tube Daily Gonfa, Taye T, MD   300 mg at 03/07/24 1024   free water  120 mL  120 mL Per Tube Q8H Rizwan, True, MD   120 mL at 03/07/24 0629   glucagon  (human recombinant) (GLUCAGEN ) injection    PRN Henn, Adam, MD   0.5 mg at 02/28/24 1030   ipratropium-albuterol  (DUONEB) 0.5-2.5 (3) MG/3ML nebulizer solution 3 mL  3 mL Nebulization Q4H PRN Debby Camila LABOR, MD   3 mL at 03/07/24 0948   lip balm (CARMEX) ointment   Topical PRN Gonfa, Taye T, MD   Given at 02/24/24 0302   LORazepam  (ATIVAN ) injection 0.5 mg  0.5 mg Intravenous Q6H PRN Rizwan, Saima, MD   0.5 mg at 03/07/24 0448   magic mouthwash w/lidocaine   10 mL Oral QID PRN Debby Camila LABOR, MD   10 mL at 02/24/24 1806   magnesium  gluconate (MAGONATE) tablet 250 mg  250 mg Per Tube QHS Utomwen, Adesuwa, RPH   250 mg at 03/06/24 2225   menthol  (CEPACOL) lozenge 3 mg  1 lozenge Oral PRN Debby Camila LABOR, MD   3 mg at 02/21/24 9297   mexiletine (MEXITIL ) capsule 200 mg  200 mg Per Tube BID Utomwen, Adesuwa, RPH   200 mg at 03/07/24 1024   ondansetron  (ZOFRAN ) tablet 4 mg  4 mg Per Tube Q6H PRN Utomwen, Adesuwa, RPH       Or   ondansetron  (ZOFRAN ) injection 4 mg  4 mg Intravenous Q6H PRN Utomwen,  Adesuwa, RPH       Oral care mouth rinse  15 mL Mouth Rinse PRN Earley True, MD       piperacillin -tazobactam (ZOSYN ) IVPB 3.375 g  3.375 g Intravenous Q8H Utomwen, Adesuwa, RPH 12.5 mL/hr at 03/07/24 0629 3.375 g at 03/07/24 9370   revefenacin  (YUPELRI ) nebulizer solution 175 mcg  175 mcg Nebulization Daily Gonfa, Taye T, MD   175 mcg at 03/06/24 0927   sodium chloride  HYPERTONIC 3 % nebulizer solution 4 mL  4 mL Nebulization BID Rizwan, Saima, MD   4 mL at 03/07/24 0951   thiamine  (VITAMIN B1) tablet 100 mg  100  mg Per Tube Daily Utomwen, Adesuwa, RPH   100 mg at 03/07/24 1024     Discharge Medications: Please see discharge summary for a list of discharge medications.  Relevant Imaging Results:  Relevant Lab Results:   Additional Information [id number]  NORMAN ASPEN, LCSW     "

## 2024-03-07 NOTE — Progress Notes (Signed)
 Speech Language Pathology Treatment: Dysphagia  Patient Details Name: William Elliott MRN: [id number] DOB: 12-23-1940 Today's Date: 03/07/2024 Time: 9066-9052 SLP Time Calculation (min) (ACUTE ONLY): 14 min  Assessment / Plan / Recommendation Clinical Impression  Pt was seen for f/u, noting that since last SLP visit, pt had initiated comfort feeds per MD (purees and honey thick liquids), but there was concern for aspiration and subsequent respiratory distress, so he was made NPO again (except for lollipops, although pt says he does not like them). SLP offered ice chips, but pt declined them in order to have his breathing tx. Session focused primarily on education with pt and son both present. Emphasis was largely placed on chronic underlying dysphagia, with pt currently presenting with increased risk factors that impact his ability to tolerate aspiration without subsequent adverse events. We also talked about the importance of oral care, especially before any swabs or ice chips.  PLAN: Pt remains NPO as he does not appear to be tolerating aspiration in deconditioned state, and current GOC per son are for more aggressive care with focus on rehabilitation. He is hopeful that with improved strength, the pt will swallow again. We discussed that we likely need to see if he can get back to his baseline level of function for overall strength to see if he can start to compensate better for his chronic dysphagia, and if some of the risk factors that would make him more prone to adverse events could be better managed.   HPI HPI: William Elliott is an 84 yo male with recent admission 12/15-12/20 for acute GIB complicated by ABLA with hemorrhagic shock. Pt was also dx acute on chronic CHF exacerbation and PNA. 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 Pharyngeal residue and larygneal penetration were noted across consistencies, as well as aspiration of liquids. Pt returned to the  hospital on 12/21 due to acute shortness of breath and fever. Pt admitted with concern for PNA and also started on tx for thrush. MBS on 12/24 for assessment of swallow strategies; nothing found to significantly reduce aspiration risk. Patient and family spoke with SLP and attending (Dr. Kathrin) and discussions ongoing on potential PEG. PMH also includes:  CAD s/p CABG, chronic A-fib (not on anticoagulation d/t previous GIB), HTN, HLD, prior CVA (2017), COPD      SLP Plan  Continue with current plan of care        Swallow Evaluation Recommendations   Recommendations: NPO;Ice chips PRN after oral care Medication Administration: Via alternative means Oral care recommendations: Oral care QID (4x/day);Oral care before ice chips/water      Recommendations                           Dysphagia, oropharyngeal phase (R13.12)     Continue with current plan of care     William Elliott., M.A. CCC-SLP Acute Rehabilitation Services Office: [phone]  Secure chat preferred   03/07/2024, 10:38 AM

## 2024-03-07 NOTE — Progress Notes (Signed)
 Nutrition Follow-up  DOCUMENTATION CODES:   Non-severe (moderate) malnutrition in context of chronic illness  INTERVENTION:   -Continue 1 carton (237 ml) Osmolite 1.5 five times daily via PEG -Flush with 60 ml free water  before and after each feeding -Additional free water : 120 ml TID -Provides 1775 kcals, 74g protein and 1865 ml H2O  NUTRITION DIAGNOSIS:   Moderate Malnutrition related to chronic illness, dysphagia as evidenced by mild fat depletion, mild muscle depletion, energy intake < 75% for > 7 days, percent weight loss.  Ongoing.  GOAL:   Patient will meet greater than or equal to 90% of their needs  Meeting with tube feeding.  MONITOR:   TF tolerance, Labs  ASSESSMENT:   84 year old M with PMH of CAD/CABG, CVA with dysphagia and dysarthria, systolic CHF, chronic A-fib, HTN, HLD, diverticulosis and GI bleed returning with generalized weakness, shortness of breath and low-grade fever, and admitted with multifocal pneumonia concerning for aspiration pneumonia.  12/21: admitted 12/29: PEG placed in IR  Patient expected to discharge today to SNF.  Now at goal for tube feeding and tolerating bolus-gravity feeds.  Recommended to stay NPO by SLP given aspiration risk and chronic dysphagia.  Anticipate long-term need of enteral nutrition.  Admission weight: 157 lbs Current weight: 149 lbs  Medications: Pepcid , Ferrous sulfate , MAGONATE, Thiamine    Labs reviewed: CBGs: 163-168   Diet Order:   Diet Order             Diet NPO time specified Except for: Ice Chips  Diet effective now                   EDUCATION NEEDS:   Education needs have been addressed  Skin:  Skin Assessment: Reviewed RN Assessment  Last BM:  1/5 -type 5  Height:   Ht Readings from Last 1 Encounters:  02/20/24 5' 10 (1.778 m)    Weight:   Wt Readings from Last 1 Encounters:  03/05/24 68 kg    BMI:  Body mass index is 21.51 kg/m.  Estimated Nutritional Needs:    Kcal:  1750-1950  Protein:  80-90g  Fluid:  1.9L/day   Morna Lee, MS, RD, LDN Inpatient Clinical Dietitian Contact via Secure chat

## 2024-03-07 NOTE — Discharge Summary (Signed)
 Physician Discharge Summary  William Elliott FMW:[id number] DOB: [birth year] DOA: 02/20/2024  PCP: [name], MD  Admit date: 02/20/2024 Discharge date: 03/07/2024 Discharging to: SNF Diet: NPO Recommendations for Outpatient Follow-up:  Please note that this patient is a high risk for aspiration and please follow strict aspiration precautions Palliative care to follow at facility If patient aspirates and his condition deteriorates, recommend transitioning to comfort care  Consults:  Palliative care IR Procedures:  PEG tube   Discharge Diagnoses:   Principal Problem:   Multifocal pneumonia Active Problems:   PAF (paroxysmal atrial fibrillation) (HCC)   Symptomatic anemia   Acute on chronic systolic heart failure (HCC)   Chronic atrial fibrillation (HCC)   Diverticulosis of colon   Malnutrition of moderate degree   Brief hospital course: 84 year old previously active and independent male with PMH of CAD/DES stenting/CABG, CVA with dysphagia and dysarthria, systolic CHF, chronic A-fib, HTN, HLD, diverticulosis and GI bleed returning with generalized weakness, shortness of breath and low-grade fever, and admitted with multifocal pneumonia concerning for aspiration pneumonia.  Recently hospitalized for acute GI bleed due to diverticulosis and treated for pneumonia and discharged home on 12/20.   Evaluated by SLP who recommended NPO.  Patient and family opted for a G tube and thus, IR consulted for G-tube placement .  CODE STATUS changed to DNR on 12/26 due to aspiration risk. G-tube placed on 12/29. 83/4- the patient tried comfort feeds, aspirated again and developed respiratory distress. Will need to be maintained NPO unless comfort care. OK to have mouth swabs and lollipops for short intervals. I feel this patient is nearing comfort care and at this time we can start PRN IV Ativan  for his insomnia.   The goal is still SNF with palliative care.    Subjective:  Very short of  breath.    Assessment and Plan: Principal Problem:   Multifocal pneumonia- recurrent aspiration  Recent RSV Severe oropharyngeal dysphagia - initial pneumonia improved - SLP recommended NPO however, son asked for comfort feeds- he was placed on a pureed diet for comfort - unfortunately aspirated again 1/4- back to NPO for now - cont Neb treatments and O2 for respiratory distress- BiPAP decllined and NT suctioning declined   Active Problems:   Malnutrition of moderate degree with oropharyngeal dysphagia  - due to dysphagia and poor oral intake, a G tube was placed - Osmolite 237 TID with 60 cc free water  before and after each feed and 120 cc TID - advanced to 5 cartons a day     Acute on chronic HF rER, HTN - ECHO > EF 35-40%, RV pressure and volume overload noted, bilateral atrial dilatation - resolved with Lasix  - does not need a daily diuretic at this time as we are controlling his fluid intake via PEG tube - Lasix , Aldactone , Imdur , Losartan , Amlodipine , empagliflozin  on hold    Chronic A fib - not on AC due to GI bleed - rate controlled  Anemia - recent diverticular bleed causing anemia - during this hospital stay he required 1 U PRBC on 12/23 and again on 12/26  Insomnia- poor sleep hygiene - He has poor sleep hygiene at baseline and typically stays awake until 2 AM and sleeps through the morning - his son has repeatedly requested sedative be given to him at 2 AM when he wakes up - he has an order for 0.5 mg TID PRN Xanax - this dose has actually been reduced from him home dose of 1 mg TID routine  Severe physical deconditioning  - uses a rolling walker at baseline but now bed bound and very frail - will be transitioning to SNF once off the isolation period for RSV on 1/6 - poor prognosis- palliative care to follow as outpt     Allergies as of 03/07/2024       Reactions   Anoro Ellipta  [umeclidinium-vilanterol] Other (See Comments)   Cardiac problems   Sulfites  Shortness Of Breath, Anxiety   Lipitor [atorvastatin Calcium ] Other (See Comments)   Unknown reaction   Niaspan [niacin] Other (See Comments)   Unknown reaction   Tricor [fenofibrate] Other (See Comments)   Fatigue    Zocor [simvastatin] Other (See Comments)   Unknown reaction        Medication List     STOP taking these medications    amLODipine  5 MG tablet Commonly known as: NORVASC    aspirin  EC 81 MG tablet   CHELATED ZINC  PO   empagliflozin  10 MG Tabs tablet Commonly known as: JARDIANCE    FISH OIL PO   furosemide  20 MG tablet Commonly known as: LASIX    IRON  PO   isosorbide  mononitrate 30 MG 24 hr tablet Commonly known as: IMDUR    losartan  25 MG tablet Commonly known as: COZAAR    Nexlizet 180-10 MG Tabs Generic drug: Bempedoic Acid -Ezetimibe    nitroGLYCERIN  0.4 MG SL tablet Commonly known as: NITROSTAT    nystatin powder Commonly known as: MYCOSTATIN/NYSTOP   pantoprazole  40 MG tablet Commonly known as: PROTONIX    potassium chloride  SA 20 MEQ tablet Commonly known as: KLOR-CON  M   spironolactone  25 MG tablet Commonly known as: ALDACTONE    Stiolto Respimat  2.5-2.5 MCG/ACT Aers Generic drug: Tiotropium Bromide -Olodaterol   SUPER B COMPLEX PO   VITAMIN C  PO       TAKE these medications    acetaminophen  325 MG tablet Commonly known as: TYLENOL  Place 2 tablets (650 mg total) into feeding tube every 6 (six) hours as needed for mild pain (pain score 1-3) or fever (or Fever >/= 101). What changed:  medication strength how much to take how to take this when to take this reasons to take this   albuterol  108 (90 Base) MCG/ACT inhaler Commonly known as: VENTOLIN  HFA Inhale 2 puffs into the lungs every 6 (six) hours as needed for wheezing or shortness of breath.   ALPRAZolam  0.5 MG tablet Commonly known as: XANAX  Place 1 tablet (0.5 mg total) into feeding tube 3 (three) times daily as needed for anxiety. What changed:  medication  strength how much to take how to take this when to take this   arformoterol  15 MCG/2ML Nebu Commonly known as: BROVANA  Take 2 mLs (15 mcg total) by nebulization 2 (two) times daily.   budesonide  0.5 MG/2ML nebulizer solution Commonly known as: PULMICORT  Take 2 mLs (0.5 mg total) by nebulization 2 (two) times daily.   famotidine  40 MG/5ML suspension Commonly known as: PEPCID  Place 2.5 mLs (20 mg total) into feeding tube daily.   feeding supplement (OSMOLITE 1.5 CAL) Liqd Place 237 mLs into feeding tube 5 (five) times daily.   ferrous sulfate  300 (60 Fe) MG/5ML syrup Place 5 mLs (300 mg total) into feeding tube daily.   free water  Soln Place 120 mLs into feeding tube every 8 (eight) hours.   ipratropium-albuterol  0.5-2.5 (3) MG/3ML Soln Commonly known as: DUONEB Take 3 mLs by nebulization every 4 (four) hours as needed (wheezing, shortness of breath).   magnesium  gluconate 500 (27 Mg) MG Tabs tablet Commonly known as: MAGONATE  Place 1 tablet (500 mg total) into feeding tube at bedtime.   Mens 50+ Multivitamin Tabs Take 1 tablet by mouth daily with breakfast.   mexiletine 200 MG capsule Commonly known as: MEXITIL  Place 1 capsule (200 mg total) into feeding tube 2 (two) times daily. What changed: how to take this   pediatric multivitamin + iron  11 MG/ML Soln oral solution Place 1 mL into feeding tube daily.        Contact information for after-discharge care     Destination     [facility], [place] .   Service: Skilled Nursing Contact information: [address] [place]  [phone]                        The results of significant diagnostics from this hospitalization (including imaging, microbiology, ancillary and laboratory) are listed below for reference.    CT CHEST W CONTRAST Result Date: 03/02/2024 EXAM: CT CHEST WITH CONTRAST 02/29/2024 04:50:59 PM TECHNIQUE: CT of the chest was performed with the  administration of 75 mL of iohexol  (OMNIPAQUE ) 300 MG/ML solution. Multiplanar reformatted images are provided for review. Automated exposure control, iterative reconstruction, and/or weight based adjustment of the mA/kV was utilized to reduce the radiation dose to as low as reasonably achievable. COMPARISON: CT angio chest 02/21/2024. CLINICAL HISTORY: 84 year old male with pneumonia and complication suspected on x-ray. History of stroke with dysphagia, dysarthria, and systolic CHF. FINDINGS: MEDIASTINUM: Status post median sternotomy and CABG procedure. Complete chamber enlargement. Aortic atherosclerotic calcification. No pericardial effusion. Decreased AP diameter of the trachea and central airways. LYMPH NODES: Right paratracheal lymph node measures 1.5 cm, image 73/3. This is borderline significant. No hilar or axillary lymphadenopathy. LUNGS AND PLEURA: Small bilateral pleural effusions are identified, left greater than right. Motion artifact diminishes lung detail. Multifocal geographic areas of ground-glass attenuation noted throughout the right upper lobe with patchy ground-glass densities in both lung apices. Consolidative changes are noted within both lung bases. Scattered peribronchovascular nodularity is noted within the left upper lobe and both lower lobes. The findings are consistent with pneumonia and its complications. No pneumothorax. SOFT TISSUES/BONES: Status post median sternotomy and CABG procedure. No acute abnormality of the bones or soft tissues. UPPER ABDOMEN: Pneumoperitoneum is identified within the image portions of the upper abdomen. In the setting of recent percutaneous gastrostomy tube placement, this raises concern for iatrogenic injury. The patient is 2 days post percutaneous gastrostomy tube placement under fluoroscopic guidance. The gastrostomy tube appears well positioned within the gastric lumen with retention balloon in place. Status post cholecystectomy. Clinical correlation  and follow-up imaging as appropriate are recommended. IMPRESSION: 1. Multifocal ground-glass and consolidative opacities, most consistent with pneumonia, including aspiration given the history of dysphagia; consider pulmonary edema or organizing pneumonia if not improving as expected, with follow-up chest radiograph in 6-8 weeks to document resolution. 2. Small bilateral pleural effusions, left greater than right. 3. Status post fluoroscopic guided percutaneous gastrostomy tube placement. The gastrostomy tube appears well positioned within the gastric lumen. Pneumoperitoneum is identified on today's study, which may be a benign finding after percutaneous gastrostomy tube placement. Electronically signed by: Waddell Calk MD 03/02/2024 06:58 AM EST RP Workstation: [id number]   DG Chest Port 1 View Result Date: 02/29/2024 CLINICAL DATA:  Shortness of breath. EXAM: PORTABLE CHEST 1 VIEW COMPARISON:  02/26/2024 FINDINGS: Stable enlarged cardiac silhouette and post CABG changes. Interval patchy densities in the right lower and mid to upper lung zones. Decreased  patchy density and consolidation in the left lower lobe. Decreased prominence of the pulmonary vasculature with stable diffuse prominence of the interstitial markings some Kerley lines. Minimal right and small left pleural effusions, unchanged. Thoracic spine degenerative changes. IMPRESSION: 1. Interval pneumonia in the right lower and mid to upper lung zones. 2. Decreased left lower lobe atelectasis or pneumonia. 3. Stable cardiomegaly and mild interstitial pulmonary edema with improved pulmonary vascular congestion. 4. Stable minimal right and small left pleural effusions. Electronically Signed   By: Elspeth Bathe M.D.   On: 02/29/2024 16:33   IR GASTROSTOMY TUBE MOD SED Result Date: 02/28/2024 INDICATION: 84 year old with dysphagia and aspiration. EXAM: PLACEMENT A PERCUTANEOUS GASTROSTOMY TUBE WITH FLUOROSCOPIC GUIDANCE Physician: Juliene SAUNDERS. Henn, MD  MEDICATIONS: Ancef  2 g; Antibiotics were administered within 1 hour of the procedure. Glucagon  0.5 mg ANESTHESIA/SEDATION: Moderate (conscious) sedation was employed during this procedure. A total of Versed  1mg  and fentanyl  100 mcg was administered intravenously at the order of the provider performing the procedure. Total intra-service moderate sedation time: 29 minutes. [name] level of consciousness and vital signs were monitored continuously by radiology nurse throughout the procedure under the supervision of the provider performing the procedure. FLUOROSCOPY: Radiation Exposure Index (as provided by the fluoroscopic device): 41 mGy Kerma COMPLICATIONS: None immediate. PROCEDURE: The procedure was explained to the patient and family. The risks and benefits of the procedure were discussed and questions were addressed. Informed consent was obtained from the patient/family. Anterior abdomen was prepped and draped in sterile fashion. Maximal barrier sterile technique was utilized including caps, mask, sterile gowns, sterile gloves, sterile drape, hand hygiene and skin antiseptic. Nasogastric tube was placed with fluoroscopy. The stomach was insufflated through the nasogastric tube. Skin was anesthetized using 1% lidocaine . Using fluoroscopic guidance, a Saf-T-Pexy T fastener was deployed within the stomach. A second Saf-T-Pexy T fastener was deployed using fluoroscopic guidance. An incision was made between the T-fasteners. Needle was directed through this incision into the stomach using fluoroscopic guidance. Wire was advanced into the stomach. 8 mm x 80 mm Athletis balloon and 18 French balloon retention gastrostomy tube were loaded on the wire. The angioplasty balloon was inflated across the stomach wall and in anterior abdominal soft tissues. Angioplasty balloon was deflated as the gastrostomy tube was advanced over the balloon into the stomach. Gastrostomy tube balloon was inflated with very dilute contrast.  Wire and angioplasty balloon were removed. Contrast injection confirmed placement within the stomach. FINDINGS: Gastrostomy tube confirmed within the stomach. IMPRESSION: 1. Successful placement of a percutaneous gastrostomy tube using fluoroscopic guidance. 2. T-fasteners can be cut and removed in 2 weeks. Electronically Signed   By: Juliene Balder M.D.   On: 02/28/2024 12:13   DG Abd Portable 1V Result Date: 02/28/2024 EXAM: 1 VIEW XRAY OF THE ABDOMEN 02/28/2024 05:24:00 AM COMPARISON: CT abdomen and pelvis 02/15/2024. CLINICAL HISTORY: Gastrostomy status (HCC) 14941 FINDINGS: LINES, TUBES AND DEVICES: Gastrostomy tube balloon not well seen. BOWEL: Nonobstructive bowel gas pattern. The enteric contrast material is identified within the colon up to the level of the rectum. SOFT TISSUES: Sternotomy wires and surgical clips noted. Right upper quadrant surgical clips noted. BONES: No acute fracture. LUNGS: Bibasilar airspace opacities. IMPRESSION: 1. Gastrostomy tube balloon is not well visualized on this exam; consider further evaluation if there is concern for malposition. Electronically signed by: Waddell Calk MD 02/28/2024 06:58 AM EST RP Workstation: [id number]   DG Chest Port 1 View Result Date: 02/26/2024 EXAM: 1 VIEW(S) XRAY OF THE CHEST 02/26/2024  01:02:00 AM COMPARISON: 02/20/2024 CLINICAL HISTORY: SOB (shortness of breath) FINDINGS: LUNGS AND PLEURA: Mild diffuse interstitial pulmonary edema persists, stable since prior examination. New retrocardiac opacification has developed in keeping with progressive atelectasis, infiltrate, or posterolateral pleural fluid. No pneumothorax. Trace right pleural effusion. HEART AND MEDIASTINUM: Stable cardiomegaly. Status post coronary artery bypass grafting. BONES AND SOFT TISSUES: No acute osseous abnormality. IMPRESSION: 1. Mild diffuse interstitial pulmonary edema, stable. 2. New retrocardiac opacification, which may represent progressive atelectasis, infiltrate,  or posteriorly layering pleural fluid. 3. Trace right pleural effusion. 4. Stable cardiomegaly Electronically signed by: Dorethia Molt MD 02/26/2024 01:28 AM EST RP Workstation: [id number]   DG Swallowing Func-Speech Pathology Result Date: 02/23/2024 Table formatting from the original result was not included. Images from the original result were not included. Modified Barium Swallow Study Patient Details Name: REYNARD CHRISTOFFERSEN MRN: [id number] Date of Birth: Jun 03, 1940 Today's Date: 02/23/2024 HPI/PMH: HPI: Mr. Weekly is an 84 yo male with recent admission 12/15-12/20 for acute GIB complicated by ABLA with hemorrhagic shock. Pt was also dx acute on chronic CHF exacerbation and PNA. 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 Pharyngeal residue and larygneal penetration were noted across consistencies, as well as aspiration of liquids. Pt returned to the hospital on 12/21 due to acute shortness of breath and fever. Pt admitted with concern for PNA and also started on tx for thrush. PMH also includes:  CAD s/p CABG, chronic A-fib (not on anticoagulation d/t previous GIB), HTN, HLD, prior CVA (2017), COPD Clinical Impression: Rec: continue NPO, oral meds as per MD recommendation,  He would benefit from GOC discussion with medical care team and palliative care consult. SLP will continue to follow. Dreyson Mishkin presents with a severe oropharyngeal dysphagia as per this MBS. As compared to MBS on 12/19, his epiglottis appears edematous (see image) and he exhibited an increase in pharyngeal residuals, specifically posterior pharyngeal wall residuals. Today's study focused on trial of various strategies to determine their effectiveness with patient's swallow function and airway protection. There was no observed benefit to reclined posture, posterior head tilt, right head turn or left head turn. PO's of thin liquid, nectar thick liquid and puree solids were used during this MBS.  Suspected cervical osteophytes prevent epiglottic inversion and osteophytes further down cervical spine impacted bolus transit through upper esopahagus. Patient with aspiration of gross amount of thin liquids prior to swallow initiation which was not immediately sensed. (PAS 8) as well as aspiration during and after the swallow with thin and nectar thick liquids from residuals spilling over arytenoids into airway. DIGEST Swallow Severity Rating*  Safety: 4  Efficiency:3  Overall Pharyngeal Swallow Severity: 4 1: mild; 2: moderate; 3: severe; 4: profound *The Dynamic Imaging Grade of Swallowing Toxicity is standardized for the head and neck cancer population, however, demonstrates promising clinical applications across populations to standardize the clinical rating of pharyngeal swallow safety and severity. Factors that may increase risk of adverse event in presence of aspiration Noe & Lianne 2021): Factors that may increase risk of adverse event in presence of aspiration Noe & Lianne 2021): Poor general health and/or compromised immunity; Frequent aspiration of large volumes; Weak cough; Limited mobility; Frail or deconditioned Recommendations/Plan: Swallowing Evaluation Recommendations Swallowing Evaluation Recommendations Recommendations: NPO Medication Administration: Other (Comment) (per MD recommendations) Oral care recommendations: Oral care QID (4x/day) Recommended consults: Consider Palliative care Caregiver Recommendations: Have oral suction available Treatment Plan Treatment Plan Treatment recommendations: Therapy as outlined in treatment plan below Follow-up recommendations: Follow  physicians's recommendations for discharge plan and follow up therapies Functional status assessment: Patient has had a recent decline in their functional status and demonstrates the ability to make significant improvements in function in a reasonable and predictable amount of time. Treatment frequency: Min 2x/week  Treatment duration: 1 week Interventions: Aspiration precaution training; Patient/family education; Trials of upgraded texture/liquids; Respiratory muscle strength training Recommendations Recommendations for follow up therapy are one component of a multi-disciplinary discharge planning process, led by the attending physician.  Recommendations may be updated based on patient status, additional functional criteria and insurance authorization. Assessment: Orofacial Exam: Orofacial Exam Oral Cavity: Oral Hygiene: WFL Oral Cavity - Dentition: Adequate natural dentition Orofacial Anatomy: WFL Oral Motor/Sensory Function: WFL Anatomy: Anatomy: Suspected cervical osteophytes Boluses Administered: Boluses Administered Boluses Administered: Thin liquids (Level 0); Puree; Mildly thick liquids (Level 2, nectar thick)  Oral Impairment Domain: Oral Impairment Domain Lip Closure: No labial escape Tongue control during bolus hold: Not tested Location of oral residue : Tongue Initiation of pharyngeal swallow : Pyriform sinuses  Pharyngeal Impairment Domain: Pharyngeal Impairment Domain Soft palate elevation: No bolus between soft palate (SP)/pharyngeal wall (PW) Laryngeal elevation: Partial superior movement of thyroid  cartilage/partial approximation of arytenoids to epiglottic petiole Anterior hyoid excursion: Partial anterior movement Epiglottic movement: No inversion Laryngeal vestibule closure: Incomplete, narrow column air/contrast in laryngeal vestibule Pharyngoesophageal segment opening: Minimal distention/minimal duration, marked obstruction of flow Pharyngeal residue: Majority of contrast within or on pharyngeal structures Location of pharyngeal residue: Valleculae; Tongue base; Pharyngeal wall; Pyriform sinuses; Diffuse (>3 areas)  Esophageal Impairment Domain: No data recorded Pill: No data recorded Penetration/Aspiration Scale Score: Penetration/Aspiration Scale Score 1.  Material does not enter airway: Puree 8.   Material enters airway, passes BELOW cords without attempt by patient to eject out (silent aspiration) : Thin liquids (Level 0); Mildly thick liquids (Level 2, nectar thick) Compensatory Strategies: Compensatory Strategies Compensatory strategies: Yes Left head turn: Ineffective Ineffective Left Head Turn: Thin liquid (Level 0) Right head turn: Ineffective Ineffective Right Head Turn: Thin liquid (Level 0) Reclining posture: Ineffective Ineffective Reclining Posture: Thin liquid (Level 0) Posterior head tilt: Ineffective Ineffective Posterior head tilt: Thin liquid (Level 0)   General Information: Caregiver present: No  Diet Prior to this Study: NPO   Temperature : Normal   Respiratory Status: WFL   Supplemental O2: None (Room air)   History of Recent Intubation: No  Behavior/Cognition: Alert; Cooperative; Pleasant mood Self-Feeding Abilities: Able to self-feed Baseline vocal quality/speech: Dysphonic Volitional Cough: Able to elicit Volitional Swallow: Able to elicit Exam Limitations: No limitations Goal Planning: Prognosis for improved oropharyngeal function: Guarded Barriers to Reach Goals: Severity of deficits; Time post onset No data recorded Patient/Family Stated Goal: patient verbalized that he did not want a feeding tube, but I'll get one if I need to Consulted and agree with results and recommendations: Patient Pain: Pain Assessment Pain Assessment: No/denies pain Breathing: 0 Negative Vocalization: 0 Body Language: 0 Consolability: 0 End of Session: Start Time:SLP Start Time (ACUTE ONLY): 1108 Stop Time: SLP Stop Time (ACUTE ONLY): 1123 Time Calculation:SLP Time Calculation (min) (ACUTE ONLY): 15 min Charges: SLP Evaluations $ SLP Speech Visit: 1 Visit SLP Evaluations $MBS Swallow: 1 Procedure $Swallowing Treatment: 1 Procedure SLP visit diagnosis: SLP Visit Diagnosis: Dysphagia, oropharyngeal phase (R13.12) Past Medical History: Past Medical History: Diagnosis Date  Acute lower GI bleeding 2008  Anxiety    Asthma   Carotid artery disease   Asymptomatic left carotid bruit  COPD (chronic obstructive pulmonary disease) (HCC)  Coronary artery disease   a. CABG - 1999 with LIMA-LAD, SVG-DIAG, SVG-OM, SVG-RPDA  b. Cath in setting of NSTEMI 03/18/2015: patent LIMA-LAD, occluded SVG-RCA, occluded SVG-D1, 99% stenosis of SVG-OM treated w/ DES  Depression   Dyslipidemia   Erectile dysfunction   Essential hypertension   History of blood transfusion 2008  History of stroke   Hyperlipidemia   Myocardial infarction [facility])  Past Surgical History: Past Surgical History: Procedure Laterality Date  CARDIAC CATHETERIZATION    CARDIAC CATHETERIZATION N/A 03/18/2015  Procedure: Left Heart Cath and Cors/Grafts Angiography;  Surgeon: Alm LELON Clay, MD;  Location: [facility] INVASIVE CV LAB;  Service: Cardiovascular;  Laterality: N/A;  CARDIAC CATHETERIZATION N/A 03/18/2015  Procedure: Coronary Stent Intervention;  Surgeon: Alm LELON Clay, MD;  Location: [facility] INVASIVE CV LAB;  Service: Cardiovascular;  Laterality: N/A;  CATARACT EXTRACTION W/ INTRAOCULAR LENS  IMPLANT, BILATERAL Bilateral   COLONOSCOPY N/A 02/16/2024  Procedure: COLONOSCOPY;  Surgeon: Albertus Gordy HERO, MD;  Location: [facility] ENDOSCOPY;  Service: Gastroenterology;  Laterality: N/A;  CORONARY ANGIOPLASTY    CORONARY ARTERY BYPASS GRAFT  1999  CORONARY STENT INTERVENTION N/A 04/22/2020  Procedure: CORONARY STENT INTERVENTION;  Surgeon: Claudene Victory LELON, MD;  Location: [facility] INVASIVE CV LAB;  Service: Cardiovascular;  Laterality: N/A;  ESOPHAGOGASTRODUODENOSCOPY N/A 02/16/2024  Procedure: EGD (ESOPHAGOGASTRODUODENOSCOPY);  Surgeon: Albertus Gordy HERO, MD;  Location: [facility] ENDOSCOPY;  Service: Gastroenterology;  Laterality: N/A;  FRACTURE SURGERY    HERNIA REPAIR    HOT HEMOSTASIS N/A 02/16/2024  Procedure: COLONOSCOPY, WITH ARGON PLASMA COAGULATION;  Surgeon: Albertus Gordy HERO, MD;  Location: [facility] ENDOSCOPY;  Service: Gastroenterology;  Laterality: N/A;  LEFT HEART CATH AND CORS/GRAFTS ANGIOGRAPHY N/A 04/22/2020   Procedure: LEFT HEART CATH AND CORS/GRAFTS ANGIOGRAPHY;  Surgeon: Claudene Victory LELON, MD;  Location: [facility] INVASIVE CV LAB;  Service: Cardiovascular;  Laterality: N/A;  TIBIA FRACTURE SURGERY Left [facility]; took bone out of my right hip  TONSILLECTOMY    UMBILICAL HERNIA REPAIR  ~ 2000 Norleen IVAR Blase, MA, CCC-SLP Speech Therapy 02/23/2024, 1:24 PM  CT Angio Chest PE W and/or Wo Contrast Result Date: 02/21/2024 CLINICAL DATA:  Shortness of breath EXAM: CT ANGIOGRAPHY CHEST WITH CONTRAST TECHNIQUE: Multidetector CT imaging of the chest was performed using the standard protocol during bolus administration of intravenous contrast. Multiplanar CT image reconstructions and MIPs were obtained to evaluate the vascular anatomy. RADIATION DOSE REDUCTION: This exam was performed according to the departmental dose-optimization program which includes automated exposure control, adjustment of the mA and/or kV according to patient size and/or use of iterative reconstruction technique. CONTRAST:  75mL OMNIPAQUE  IOHEXOL  350 MG/ML SOLN COMPARISON:  Chest x-ray from the previous day FINDINGS: Cardiovascular: Atherosclerotic calcifications of the thoracic aorta are noted. Changes of prior coronary bypass grafting are seen. The pulmonary artery shows a normal branching pattern bilaterally. No filling defect to suggest pulmonary embolism is noted. Coronary calcifications are noted. Heart is mildly. Mediastinum/Nodes: Esophagus is within normal limits. No hilar or mediastinal adenopathy is noted. The thoracic inlet is unremarkable. Lungs/Pleura: Lungs show patchy bibasilar infiltrate with associated small effusions consistent with multifocal pneumonia. Upper Abdomen: Gallbladder has been surgically removed. Musculoskeletal: No chest wall abnormality. No acute or significant osseous findings. Review of the MIP images confirms the above findings. IMPRESSION: No evidence of pulmonary emboli. Bibasilar infiltrates consistent with  multifocal pneumonia. Aortic Atherosclerosis (ICD10-I70.0). Electronically Signed   By: Oneil Devonshire M.D.   On: 02/21/2024 01:59   DG Chest Portable 1 View Result Date: 02/20/2024  CLINICAL DATA:  Shortness of breath EXAM: PORTABLE CHEST 1 VIEW COMPARISON:  02/18/2024, 02/15/2024, 01/20/2023 FINDINGS: Post sternotomy changes. Cardiomegaly with vascular congestion. Patchy bilateral pulmonary opacities either reflecting mild edema or diffuse pneumonia. Suspicion of small pleural effusions. Aortic atherosclerosis. Not much interval change compared with radiograph 2 days prior. IMPRESSION: Cardiomegaly with vascular congestion. Patchy bilateral pulmonary opacities either reflecting mild edema or diffuse pneumonia. Suspicion of small pleural effusions. Electronically Signed   By: Luke Bun M.D.   On: 02/20/2024 23:36   DG Swallowing Func-Speech Pathology Result Date: 02/18/2024 Table formatting from the original result was not included. Modified Barium Swallow Study Patient Details Name: JACEYON STROLE MRN: [id number] Date of Birth: [birth year] Today's Date: 02/18/2024 HPI/PMH: HPI: 84 yo male presenting to ED 12/15 for GIB. S/p EGD and colonoscopy 12/17. CXR 12/18 shows hazy and reticular opacities bilaterally, more pronounced in the lung bases, possibly reflecting alveolar and interstitial edema or pneumonitis. PMH includes CAD s/p CABG, chronic A-fib (not on anticoagulation d/t previous GIB), HTN, HLD, prior CVA (2017), COPD. BSE indicated need for MBS d/t overt s/s of aspiration during evaluation. Clinical Impression: Clinical Impression: Recommend Dysphagia 3/thin liquids with esophageal and swallowing precautions in place including multiple sips, repetititve swallows, liquid wash and throat clearing prn intermittently during meals. Medications may be beneficial with small sips of thin liquids and/or in puree/whole. ST will f/u for dysphagia tx/management in acute setting. Recommend ST f/u at next venue  for dysphagia tx.   Pt presents with mild-moderate oropharyngeal dysphagia/mild-mod pharyngoesophageal dysphagia c/b decreased tongue control resulting in posterior escape of less than half of the bolus with thin and slow, prolonged mastication with complete recollection.  Delayed initiation of tongue motion with trace residue lining oral structures.  Swallow triggered at the level of the valleculae.  Partial anterior hyoid movement and decreased epiglottic inversion due to presence of cervical osteophytes with mild-moderate vallecular/posterior pharyngeal wall residue requiring multiple swallows, liquid wash and effortful swallow in conjunction to clear material. Pt did exhibit penetration and/or aspiration with residue with incomplete narrow column of air and contrast within the laryngeal vestibule.  Pharyngeal stripping wave diminished.  Partial distension/duration and partial obstruction of flow with PES opening.  Tongue base decreased resulting in narrow column of contrast between PPW and tongue base.  SLight esophageal retention initially, but improved as study progressed. ST will f/u in acute setting for dysphagia tx/education. DIGEST Swallow Severity Rating*  Safety: 2  Efficiency:2  Overall Pharyngeal Swallow Severity: moderate 1: mild; 2: moderate; 3: severe; 4: profound *The Dynamic Imaging Grade of Swallowing Toxicity is standardized for the head and neck cancer population, however, demonstrates promising clinical applications across populations to standardize the clinical rating of pharyngeal swallow safety and severity. Factors that may increase risk of adverse event in presence of aspiration Noe & Lianne 2021): Factors that may increase risk of adverse event in presence of aspiration Noe & Lianne 2021): Poor general health and/or compromised immunity; Reduced cognitive function; Frail or deconditioned; Aspiration of thick, dense, and/or acidic materials; Frequent aspiration of large volumes  Recommendations/Plan: Swallowing Evaluation Recommendations Swallowing Evaluation Recommendations Recommendations: PO diet PO Diet Recommendation: Dysphagia 3 (Mechanical soft); Thin liquids (Level 0) Liquid Administration via: Cup Medication Administration: Whole meds with puree (or liquids if small sips) Supervision: Staff to assist with self-feeding; Intermittent supervision/cueing for swallowing strategies Swallowing strategies  : Slow rate; Small bites/sips; effortful swallow; Multiple dry swallows after each bite/sip; Follow solids with liquids Postural changes: Position pt fully upright  for meals; Stay upright 30-60 min after meals Oral care recommendations: Oral care BID (2x/day) Treatment Plan Treatment Plan Treatment recommendations: Therapy as outlined in treatment plan below Follow-up recommendations: Other (comment) (SLP f/u at next venue of care) Functional status assessment: Patient has had a recent decline in their functional status and demonstrates the ability to make significant improvements in function in a reasonable and predictable amount of time. Treatment frequency: Min 2x/week Treatment duration: 1 week Interventions: Aspiration precaution training; Patient/family education; Trials of upgraded texture/liquids; Diet toleration management by SLP Recommendations Recommendations for follow up therapy are one component of a multi-disciplinary discharge planning process, led by the attending physician.  Recommendations may be updated based on patient status, additional functional criteria and insurance authorization. Assessment: Orofacial Exam: Orofacial Exam Oral Cavity: Oral Hygiene: WFL Oral Cavity - Dentition: Adequate natural dentition Orofacial Anatomy: WFL Oral Motor/Sensory Function: WFL Anatomy: Anatomy: Suspected cervical osteophytes Boluses Administered: Boluses Administered Boluses Administered: Thin liquids (Level 0); Mildly thick liquids (Level 2, nectar thick); Moderately thick  liquids (Level 3, honey thick); Puree; Solid  Oral Impairment Domain: Oral Impairment Domain Lip Closure: No labial escape Tongue control during bolus hold: Posterior escape of less than half of bolus Bolus preparation/mastication: Slow prolonged chewing/mashing with complete recollection Bolus transport/lingual motion: Delayed initiation of tongue motion (oral holding) Oral residue: Trace residue lining oral structures Location of oral residue : Tongue Initiation of pharyngeal swallow : Valleculae  Pharyngeal Impairment Domain: Pharyngeal Impairment Domain Soft palate elevation: No bolus between soft palate (SP)/pharyngeal wall (PW) Laryngeal elevation: Complete superior movement of thyroid  cartilage with complete approximation of arytenoids to epiglottic petiole Anterior hyoid excursion: Partial anterior movement Epiglottic movement: Partial inversion Laryngeal vestibule closure: Incomplete, narrow column air/contrast in laryngeal vestibule Pharyngeal stripping wave : Present - diminished Pharyngeal contraction (A/P view only): N/A Pharyngoesophageal segment opening: Partial distention/partial duration, partial obstruction of flow Tongue base retraction: Narrow column of contrast or air between tongue base and PPW Pharyngeal residue: Collection of residue within or on pharyngeal structures Location of pharyngeal residue: Valleculae; Pharyngeal wall  Esophageal Impairment Domain: Esophageal Impairment Domain Esophageal clearance upright position: Esophageal retention Pill: Pill Consistency administered: Thin liquids (Level 0) Thin liquids (Level 0): Impaired (see clinical impressions) Penetration/Aspiration Scale Score: Penetration/Aspiration Scale Score 1.  Material does not enter airway: Puree; Solid 2.  Material enters airway, remains ABOVE vocal cords then ejected out: Thin liquids (Level 0); Mildly thick liquids (Level 2, nectar thick) 3.  Material enters airway, remains ABOVE vocal cords and not ejected out:  Thin liquids (Level 0) 4.  Material enters airway, CONTACTS cords then ejected out: Thin liquids (Level 0); Mildly thick liquids (Level 2, nectar thick) 5.  Material enters airway, CONTACTS cords and not ejected out: Thin liquids (Level 0); Moderately thick liquids (Level 3, honey thick) Compensatory Strategies: Compensatory Strategies Compensatory strategies: Yes Effortful swallow: Effective Effective Effortful Swallow: Thin liquid (Level 0); Puree Multiple swallows: Effective Effective Multiple Swallows: Thin liquid (Level 0); Puree; Solid Liquid wash: Effective Effective Liquid Wash: Thin liquid (Level 0); Puree; Solid   General Information: Caregiver present: No  Diet Prior to this Study: Regular; Thin liquids (Level 0)   Temperature : Normal   Respiratory Status: WFL   Supplemental O2: None (Room air)   History of Recent Intubation: No  Behavior/Cognition: Alert; Cooperative Self-Feeding Abilities: Able to self-feed Baseline vocal quality/speech: Dysphonic Volitional Cough: Able to elicit Volitional Swallow: Able to elicit Exam Limitations: No limitations Goal Planning: Prognosis for improved oropharyngeal function:  Good No data recorded No data recorded Patient/Family Stated Goal: none stated Consulted and agree with results and recommendations: Patient; Family member/caregiver Pain: Pain Assessment Pain Assessment: No/denies pain End of Session: Start Time:SLP Start Time (ACUTE ONLY): 9074 Stop Time: SLP Stop Time (ACUTE ONLY): 1000 Time Calculation:SLP Time Calculation (min) (ACUTE ONLY): 35 min Charges: SLP Evaluations $ SLP Speech Visit: 1 Visit SLP Evaluations $BSS Swallow: 1 Procedure $MBS Swallow: 1 Procedure SLP visit diagnosis: SLP Visit Diagnosis: Dysphagia, oropharyngeal phase (R13.12); Dysphagia, pharyngoesophageal phase (R13.14) Past Medical History: Past Medical History: Diagnosis Date  Acute lower GI bleeding 2008  Anxiety   Asthma   Carotid artery disease   Asymptomatic left carotid bruit  COPD  (chronic obstructive pulmonary disease) (HCC)   Coronary artery disease   a. CABG - 1999 with LIMA-LAD, SVG-DIAG, SVG-OM, SVG-RPDA  b. Cath in setting of NSTEMI 03/18/2015: patent LIMA-LAD, occluded SVG-RCA, occluded SVG-D1, 99% stenosis of SVG-OM treated w/ DES  Depression   Dyslipidemia   Erectile dysfunction   Essential hypertension   History of blood transfusion 2008  History of stroke   Hyperlipidemia   Myocardial infarction [facility])  Past Surgical History: Past Surgical History: Procedure Laterality Date  CARDIAC CATHETERIZATION    CARDIAC CATHETERIZATION N/A 03/18/2015  Procedure: Left Heart Cath and Cors/Grafts Angiography;  Surgeon: Alm LELON Clay, MD;  Location: [facility] INVASIVE CV LAB;  Service: Cardiovascular;  Laterality: N/A;  CARDIAC CATHETERIZATION N/A 03/18/2015  Procedure: Coronary Stent Intervention;  Surgeon: Alm LELON Clay, MD;  Location: [facility] INVASIVE CV LAB;  Service: Cardiovascular;  Laterality: N/A;  CATARACT EXTRACTION W/ INTRAOCULAR LENS  IMPLANT, BILATERAL Bilateral   CORONARY ANGIOPLASTY    CORONARY ARTERY BYPASS GRAFT  1999  CORONARY STENT INTERVENTION N/A 04/22/2020  Procedure: CORONARY STENT INTERVENTION;  Surgeon: Claudene Victory LELON, MD;  Location: [facility] INVASIVE CV LAB;  Service: Cardiovascular;  Laterality: N/A;  FRACTURE SURGERY    HERNIA REPAIR    LEFT HEART CATH AND CORS/GRAFTS ANGIOGRAPHY N/A 04/22/2020  Procedure: LEFT HEART CATH AND CORS/GRAFTS ANGIOGRAPHY;  Surgeon: Claudene Victory LELON, MD;  Location: [facility] INVASIVE CV LAB;  Service: Cardiovascular;  Laterality: N/A;  TIBIA FRACTURE SURGERY Left [facility]; took bone out of my right hip  TONSILLECTOMY    UMBILICAL HERNIA REPAIR  ~ 2000 Pat Adams,M.S.,CCC-SLP 02/18/2024, 12:42 PM  DG Chest Port 1 View Result Date: 02/18/2024 EXAM: 1 VIEW(S) XRAY OF THE CHEST 02/18/2024 12:46:00 AM COMPARISON: 02/17/2024 CLINICAL HISTORY: SOB (shortness of breath) [id number] FINDINGS: LUNGS AND PLEURA: Stable increased central vascularity is noted with very  mild interstitial edema. No focal confluent infiltrate is seen. No sizable effusion is noted. No pneumothorax. Postsurgical changes are again noted. HEART AND MEDIASTINUM: Stable cardiomegaly. No acute abnormality of the mediastinal silhouette. BONES AND SOFT TISSUES: No acute osseous abnormality. IMPRESSION: 1. Stable increased central vascularity with very mild interstitial edema. No focal confluent infiltrate or sizable effusion. 2. Stable cardiomegaly. Electronically signed by: Oneil Devonshire MD 02/18/2024 12:53 AM EST RP Workstation: [id number]   DG Chest Port 1 View Result Date: 02/17/2024 EXAM: 1 VIEW(S) XRAY OF THE CHEST 02/17/2024 06:25:02 AM COMPARISON: 02/15/2024 CLINICAL HISTORY: Coarse respiratory crackles FINDINGS: LUNGS AND PLEURA: There are hazy and reticular opacities present bilaterally, more pronounced in the lung bases, which could reflect alveolar and interstitial edema or pneumonitis. No pleural effusion. No pneumothorax. HEART AND MEDIASTINUM: Mild cardiomegaly. Aortic arch atherosclerosis. CABG markers noted. BONES AND SOFT TISSUES: Median sternotomy wires and CABG markers noted. IMPRESSION: 1. Hazy  and reticular opacities bilaterally, more pronounced in the lung bases, possibly reflecting alveolar and interstitial edema or pneumonitis. 2. Mild cardiomegaly and aortic arch atherosclerosis. Electronically signed by: Evalene Coho MD 02/17/2024 06:45 AM EST RP Workstation: [id number]   CT HEAD WO CONTRAST ( ) Result Date: 02/15/2024 EXAM: CT HEAD WITHOUT CONTRAST 02/15/2024 05:31:00 PM TECHNIQUE: CT of the head was performed without the administration of additional intravenous contrast. Residual intravenous contrast material from a CT performed earlier the same day is noted. Automated exposure control, iterative reconstruction, and/or weight based adjustment of the mA/kV was utilized to reduce the radiation dose to as low as reasonably achievable. COMPARISON: None available. CLINICAL  HISTORY: Head trauma, minor (Age >= 65y) FINDINGS: BRAIN AND VENTRICLES: No acute hemorrhage. No evidence of acute infarct. No hydrocephalus. No extra-axial collection. No mass effect or midline shift. Left frontoparietal encephalomalacia. Multiple scattered remote cerebellar infarcts noted. Moderate global atrophy. Patchy supratentorial white matter hypoattenuation, possibly reflecting chronic microvascular ischemic changes. Intracranial atherosclerosis. 5 mm right MCA bifurcation aneurysm noted (series 8, image 19). ORBITS: No acute abnormality. SINUSES: No acute abnormality. SOFT TISSUES AND SKULL: Right parietal scalp contusion. No skull fracture. IMPRESSION: 1. No acute intracranial abnormality related to the head trauma. 2. Right parietal scalp contusion. 3. 5 mm right MCA bifurcation aneurysm. Electronically signed by: Morene Hoard MD 02/15/2024 06:19 PM EST RP Workstation: [id number]   CT ANGIO GI BLEED Result Date: 02/15/2024 CLINICAL DATA:  Lower GI bleed (Ped 0-17y) EXAM: CTA ABDOMEN AND PELVIS WITHOUT AND WITH CONTRAST TECHNIQUE: Initially, a noncontrast CT of the abdomen and pelvis was performed. Subsequently, multidetector CT imaging of the abdomen and pelvis was performed using the standard protocol during bolus administration of intravenous contrast. Multiplanar reconstructed images and MIPs were obtained and reviewed to evaluate the vascular anatomy. RADIATION DOSE REDUCTION: This exam was performed according to the departmental dose-optimization program which includes automated exposure control, adjustment of the mA and/or kV according to patient size and/or use of iterative reconstruction technique. CONTRAST:  OMNIPAQUE  IOHEXOL  350 MG/ML SOLN COMPARISON:  02/15/2024 at 12:18 a.m. FINDINGS: VASCULAR Aorta: No aortic aneurysm or dissection. Similar fusiform ectasia of the infrarenal aorta measuring 2.7 cm. Extensive calcified atherosclerosis throughout the aorta. No hemodynamically  significant stenosis. Celiac: Patent without acute thrombus, aneurysm, or dissection.No hemodynamically significant stenosis. SMA: Patent without acute thrombus, aneurysm, or dissection.Severe to critical stenosis of the proximal SMA from calcified plaque. Renals: Patent without acute thrombus or dissection.Moderate stenoses of the proximal renal arteries bilaterally due to calcified plaque. 8 mm peripherally calcified aneurysm arising from the posterior aspect of the upstream right renal artery. IMA: Patent without acute thrombus, aneurysm, or dissection.No hemodynamically significant stenosis. Inflow: Patent without acute thrombus, aneurysm, or dissection.No hemodynamically significant stenosis. Proximal Outflow: The bilateral common femoral and visualized portions of the superficial and profunda femoral arteries are patent without acute thrombus, aneurysm, or dissection.No hemodynamically significant stenosis. Veins: No obvious venous abnormality within the limitations of this arterial phase study. Review of the MIP images confirms the above findings. NON-VASCULAR Lower chest: No focal airspace consolidation or pleural effusion. Hepatobiliary: No mass.Cholecystectomy.No intrahepatic or extrahepatic biliary ductal dilation. Pancreas: No mass or main ductal dilation.No peripancreatic inflammation or fluid collection. Spleen: Normal size. No mass. Adrenals/Urinary Tract: No adrenal masses. Unchanged 2 cm left upper pole cyst. Persistent contrast opacification of the renal cortices on the noncontrast imaging. No hydronephrosis or nephrolithiasis. Excreted contrast filling the bladder lumen. 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 The stomach is decompressed without acute abnormality. No small bowel wall thickening or inflammation. No small bowel obstruction.Normal appendix. Total colonic diverticulosis. No changes of acute diverticulitis. GI  Bleed: Contrast extravasation along the hepatic flexure of the colon (axial 22). Layering hyperdense material within multiple diverticula in the descending and sigmoid colon is present, possibly ingested material or layering contrast material from prior study. Lymphatic: No intraabdominal or pelvic lymphadenopathy. Reproductive: No prostatomegaly.No free pelvic fluid. Other: No pneumoperitoneum, ascites, or mesenteric inflammation. Musculoskeletal: No acute fracture or destructive lesion.Diffuse osteopenia. Multilevel degenerative disc disease of the spine. IMPRESSION: VASCULAR 1. Active GI bleeding along the hepatic flexure of the colon, possibly diverticular in nature. GI consultation recommended further management. 2. No aortic aneurysm or aortic dissection. NON-VASCULAR 1. 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 While no morphologic changes of cirrhosis are visualized, clinical and laboratory correlation requested. 2. Extensive total colonic diverticulosis. No changes of acute diverticulitis. 3. Persistent contrast opacification of the renal cortices on the noncontrast imaging, which can be seen in acute medical renal disease, possibly ATN. Critical Value/emergent results were called by telephone at the time of interpretation on 02/15/2024 at 11:00 am to provider [facility] , who verbally acknowledged these results. Aortic Atherosclerosis (ICD10-I70.0). Electronically Signed   By: Rogelia Myers M.D.   On: 02/15/2024 11:08   DG Chest Port 1 View Result Date: 02/15/2024 EXAM: 1 VIEW(S) XRAY OF THE CHEST 02/15/2024 01:39:00 AM COMPARISON: 09/12/2023 CLINICAL HISTORY: Questionable sepsis - evaluate for abnormality FINDINGS: LUNGS AND PLEURA: Mild patchy bilateral lower lobe opacities, atelectasis versus pneumonia. Increased interstitial markings without frank interstitial edema. No pleural effusion. No pneumothorax. HEART AND MEDIASTINUM: Stable mild  cardiomegaly. Status post CABG. BONES AND SOFT TISSUES: Median sternotomy. No acute osseous abnormality. IMPRESSION: 1. Mild patchy bilateral lower lobe opacities, atelectasis versus pneumonia. 2. Stable mild cardiomegaly. No frank interstitial edema. Electronically signed by: Pinkie Pebbles MD 02/15/2024 01:44 AM EST RP Workstation: [id number]   CT ANGIO GI BLEED Result Date: 02/15/2024 EXAM: CTA ABDOMEN AND PELVIS WITH AND WITHOUT CONTRAST 02/15/2024 12:18:04 AM TECHNIQUE: CTA images of the abdomen and pelvis without and with intravenous contrast. Three-dimensional MIP/volume rendered formations were performed. Automated exposure control, iterative reconstruction, and/or weight based adjustment of the mA/kV was utilized to reduce the radiation dose to as low as reasonably achievable. CONTRAST: 75 mL iohexol  (OMNIPAQUE ) 350 MG/ML injection. COMPARISON: None available. CLINICAL HISTORY: Lower GI bleed. FINDINGS: VASCULATURE: GI BLEED: Following contrast administration, there is no intraluminal spillage of contrast to suggest active GI bleeding. AORTA: Atherosclerotic calcifications of the abdominal aorta, although patent. No acute finding. No abdominal aortic aneurysm. No dissection. CELIAC TRUNK: No acute finding. No occlusion or significant stenosis. SUPERIOR MESENTERIC ARTERY: Atherosclerotic calcifications of the proximal SMA, although patent. No acute finding. No occlusion or significant stenosis. INFERIOR MESENTERIC ARTERY: No acute finding. No occlusion or significant stenosis. RENAL ARTERIES: Atherosclerotic calcifications of the bilateral renal arteries, although patent. No acute finding. No occlusion or significant stenosis. ILIAC ARTERIES: No acute finding. No occlusion or significant stenosis. ABDOMEN/PELVIS: LOWER CHEST: Cardiomegaly. Mild patchy bilateral lower lobe opacities, favoring pneumonia. Hypodense blood pool relative to myocardium, suggesting anemia. Median sternotomy, incompletely  visualized. LIVER: The liver is unremarkable. GALLBLADDER AND BILE DUCTS: Status post cholecystectomy. No biliary ductal dilatation. SPLEEN: The spleen is unremarkable. PANCREAS: The pancreas is unremarkable. ADRENAL GLANDS: Bilateral adrenal glands demonstrate no acute abnormality. KIDNEYS, URETERS AND BLADDER: No stones in the kidneys or ureters. No  hydronephrosis. No perinephric or periureteral stranding. Urinary bladder is unremarkable. GI AND BOWEL: Stomach and duodenal sweep demonstrate no acute abnormality. Normal appendix (image 51). Left colonic diverticulosis, without evidence of diverticulitis. There is no bowel obstruction. No abnormal bowel wall thickening or distension. REPRODUCTIVE: The prostate is unremarkable. PERITONEUM AND RETROPERITONEUM: No ascites or free air. LYMPH NODES: No lymphadenopathy. BONES AND SOFT TISSUES: Mild degenerative changes of the visualized thoracolumbar spine. No acute soft tissue abnormality. IMPRESSION: 1. No active GI bleeding. 2. Mild patchy bilateral lower lobe opacities, favoring pneumonia. 3. Additional ancillary findings, as above. Electronically signed by: Pinkie Pebbles MD 02/15/2024 12:35 AM EST RP Workstation: [id number]   Labs:   Basic Metabolic Panel: Recent Labs  Lab 03/01/24 0333 03/03/24 0814  NA 142 145  K 3.3* 3.7  CL 109 110  CO2 24 30  GLUCOSE 109* 101*  BUN 23 25*  CREATININE 0.72 0.71  CALCIUM  8.1* 8.5*  MG 2.2  --   PHOS 3.0  --      CBC: Recent Labs  Lab 03/01/24 0333  WBC 14.9*  HGB 8.2*  HCT 26.3*  MCV 89.8  PLT 250         SIGNED:   True Atlas, MD  [facility] 03/07/2024, 7:33 AM Time taking on discharge: 50 minutes

## 2024-03-07 NOTE — Progress Notes (Signed)
 Report called to facility, d/c instructions placed in the packet and given to PTAR. Pt d/c via PTAR to facility w all belongings in stable condition.

## 2024-03-07 NOTE — Care Management Important Message (Signed)
 Important Message  Patient Details IM Letter given. Name: William Elliott MRN: [id number] Date of Birth: April 09, 1940   Important Message Given:  Yes - Medicare IM     [name] 03/07/2024, 12:40 PM

## 2024-03-07 NOTE — TOC Transition Note (Signed)
 Transition of Care [facility]) - Discharge Note   Patient Details  Name: William Elliott MRN: [id number] Date of Birth: Apr 22, 1940  [facility] CM/SW Contact:  NORMAN ASPEN, LCSW Phone Number: 03/07/2024, 1:34 PM   Clinical Narrative:     Pt medically cleared for dc to [facility] today.  Facility ready and have insurance authorization.  Pt and son aware and agreeable.  [facility] to provide palliative services at SNF.  PTAR called at 1:35pm.  RN to call report to [phone].  No further IP CM needs.  Final next level of care: Skilled Nursing Facility Barriers to Discharge: Barriers Resolved   Patient Goals and CMS Choice            Discharge Placement              Patient chooses bed at: [facility] Patient to be transferred to facility by: PTAR Name of family member notified: son    Discharge Plan and Services Additional resources added to the After Visit Summary for                                       Social Drivers of Health (SDOH) Interventions SDOH Screenings   Food Insecurity: No Food Insecurity (02/21/2024)  Housing: Low Risk (02/21/2024)  Transportation Needs: No Transportation Needs (02/21/2024)  Utilities: Not At Risk (02/21/2024)  Alcohol Screen: Low Risk (09/14/2023)  Financial Resource Strain: Low Risk (09/14/2023)  Social Connections: Moderately Integrated (02/21/2024)  Tobacco Use: Medium Risk (02/20/2024)     Readmission Risk Interventions    09/13/2023    3:37 PM 01/22/2023   11:58 AM  Readmission Risk Prevention Plan  Post Dischage Appt  Complete  Medication Screening  Complete  Transportation Screening Complete Complete  PCP or Specialist Appt within 5-7 Days Complete   Home Care Screening Complete   Medication Review (RN CM) Complete

## 2024-03-07 NOTE — Plan of Care (Signed)
" °  Problem: Health Behavior/Discharge Planning: Goal: Ability to manage health-related needs will improve Outcome: Progressing   Problem: Clinical Measurements: Goal: Ability to maintain clinical measurements within normal limits will improve Outcome: Progressing Goal: Will remain free from infection Outcome: Progressing Goal: Diagnostic test results will improve Outcome: Progressing Goal: Respiratory complications will improve Outcome: Progressing Goal: Cardiovascular complication will be avoided Outcome: Progressing   Problem: Activity: Goal: Risk for activity intolerance will decrease Outcome: Progressing   Problem: Nutrition: Goal: Adequate nutrition will be maintained Outcome: Adequate for Discharge   Problem: Coping: Goal: Level of anxiety will decrease Outcome: Progressing   Problem: Elimination: Goal: Will not experience complications related to bowel motility Outcome: Progressing Goal: Will not experience complications related to urinary retention Outcome: Completed/Met   Problem: Pain Managment: Goal: General experience of comfort will improve and/or be controlled Outcome: Progressing   Problem: Safety: Goal: Ability to remain free from injury will improve Outcome: Progressing   Problem: Skin Integrity: Goal: Risk for impaired skin integrity will decrease Outcome: Progressing   Problem: Activity: Goal: Ability to tolerate increased activity will improve Outcome: Progressing   Problem: Clinical Measurements: Goal: Ability to maintain a body temperature in the normal range will improve Outcome: Adequate for Discharge   Problem: Respiratory: Goal: Ability to maintain adequate ventilation will improve Outcome: Progressing Goal: Ability to maintain a clear airway will improve Outcome: Progressing   "

## 2024-04-02 DEATH — deceased
